# Patient Record
Sex: Male | Born: 1960 | Race: White | Hispanic: No | Smoking: Never smoker
Health system: Southern US, Community
[De-identification: ages and names within clinical notes are randomized; demographics above are authoritative.]

## PROBLEM LIST (undated history)

## (undated) DIAGNOSIS — K7460 Unspecified cirrhosis of liver: Secondary | ICD-10-CM

## (undated) DIAGNOSIS — Z5189 Encounter for other specified aftercare: Secondary | ICD-10-CM

## (undated) DIAGNOSIS — J45909 Unspecified asthma, uncomplicated: Secondary | ICD-10-CM

## (undated) DIAGNOSIS — H269 Unspecified cataract: Secondary | ICD-10-CM

## (undated) DIAGNOSIS — D649 Anemia, unspecified: Secondary | ICD-10-CM

## (undated) DIAGNOSIS — E871 Hypo-osmolality and hyponatremia: Secondary | ICD-10-CM

## (undated) DIAGNOSIS — M009 Pyogenic arthritis, unspecified: Secondary | ICD-10-CM

## (undated) HISTORY — DX: Encounter for other specified aftercare: Z51.89

## (undated) HISTORY — DX: Anemia, unspecified: D64.9

## (undated) HISTORY — DX: Unspecified cataract: H26.9

## (undated) HISTORY — DX: Unspecified asthma, uncomplicated: J45.909

---

## 2002-02-20 ENCOUNTER — Encounter: Payer: Self-pay | Admitting: Internal Medicine

## 2002-02-20 ENCOUNTER — Encounter: Admission: RE | Admit: 2002-02-20 | Discharge: 2002-02-20 | Payer: Self-pay | Admitting: Internal Medicine

## 2004-08-26 ENCOUNTER — Ambulatory Visit: Payer: Self-pay | Admitting: Internal Medicine

## 2004-08-29 ENCOUNTER — Ambulatory Visit: Payer: Self-pay | Admitting: Internal Medicine

## 2007-01-13 DIAGNOSIS — J45909 Unspecified asthma, uncomplicated: Secondary | ICD-10-CM | POA: Insufficient documentation

## 2007-01-13 DIAGNOSIS — J309 Allergic rhinitis, unspecified: Secondary | ICD-10-CM | POA: Insufficient documentation

## 2008-03-14 ENCOUNTER — Inpatient Hospital Stay (HOSPITAL_COMMUNITY): Admission: AD | Admit: 2008-03-14 | Discharge: 2008-03-15 | Payer: Self-pay | Admitting: Urology

## 2010-05-13 ENCOUNTER — Emergency Department (HOSPITAL_COMMUNITY)
Admission: EM | Admit: 2010-05-13 | Discharge: 2010-05-13 | Payer: Self-pay | Source: Home / Self Care | Admitting: Emergency Medicine

## 2010-05-14 LAB — COMPREHENSIVE METABOLIC PANEL
ALT: 24 U/L (ref 0–53)
AST: 35 U/L (ref 0–37)
Albumin: 3.9 g/dL (ref 3.5–5.2)
Alkaline Phosphatase: 110 U/L (ref 39–117)
BUN: 6 mg/dL (ref 6–23)
CO2: 23 mEq/L (ref 19–32)
Calcium: 9.6 mg/dL (ref 8.4–10.5)
Chloride: 98 mEq/L (ref 96–112)
Creatinine, Ser: 0.87 mg/dL (ref 0.4–1.5)
GFR calc Af Amer: >60 mL/min (ref >60)
GFR calc non Af Amer: >60 mL/min (ref >60)
Glucose, Bld: 95 mg/dL (ref 70–99)
Potassium: 3.8 mEq/L (ref 3.5–5.1)
Sodium: 133 mEq/L — ABNORMAL LOW (ref 135–145)
Total Bilirubin: 1.4 mg/dL — ABNORMAL HIGH (ref 0.3–1.2)
Total Protein: 7.8 g/dL (ref 6.0–8.3)

## 2010-05-14 LAB — DIFFERENTIAL
Basophils Absolute: 0 10*3/uL (ref 0.0–0.1)
Basophils Relative: 0 % (ref 0–1)
Eosinophils Absolute: 0.1 10*3/uL (ref 0.0–0.7)
Eosinophils Relative: 1 % (ref 0–5)
Lymphocytes Relative: 15 % (ref 12–46)
Lymphs Abs: 2.3 10*3/uL (ref 0.7–4.0)
Monocytes Absolute: 1.3 10*3/uL — ABNORMAL HIGH (ref 0.1–1.0)
Monocytes Relative: 9 % (ref 3–12)
Neutro Abs: 11.4 10*3/uL — ABNORMAL HIGH (ref 1.7–7.7)
Neutrophils Relative %: 75 % (ref 43–77)

## 2010-05-14 LAB — CBC
HCT: 43.4 % (ref 39.0–52.0)
Hemoglobin: 15 g/dL (ref 13.0–17.0)
MCH: 32.8 pg (ref 26.0–34.0)
MCHC: 34.6 g/dL (ref 30.0–36.0)
MCV: 95 fL (ref 78.0–100.0)
Platelets: 247 10*3/uL (ref 150–400)
RBC: 4.57 MIL/uL (ref 4.22–5.81)
RDW: 12.5 % (ref 11.5–15.5)
WBC: 15.2 10*3/uL — ABNORMAL HIGH (ref 4.0–10.5)

## 2010-09-02 NOTE — H&P (Signed)
Kyle Pitts               ACCOUNT NO.:  [id number]   MEDICAL RECORD NO.:  [id number]          PATIENT TYPE:  INP   LOCATION:  1409                         FACILITY:  [facility]   PHYSICIAN:  Sigmund I. Patsi Sears, M.D.DATE OF BIRTH:  10/15/1960   DATE OF ADMISSION:  03/14/2008  DATE OF DISCHARGE:                              HISTORY & PHYSICAL   HISTORY:  Kyle Pitts is a 50 year old male referred by Dr. Andi Devon with  left hemiscrotal abscess.  The patient noted abnormality in the left  hemiscrotum, which he picked and drained and then noticed immediate  erythematous swelling and pain in the left hemiscrotum with fever and  chills.  He was admitted to the hospital post I and D in the operating  room.   PAST MEDICAL HISTORY:  Arthritis.   ALLERGIES:  PENICILLIN (rash).   FAMILY HISTORY:  Is significant for renal failure brother, father,  sister as well.   SOCIAL HISTORY:  The patient is currently jobless.  He is unemployed.  The patient is currently unemployed.  He is divorced.  He drinks two  alcoholic drinks per day, drinks two cups of coffee per day.   PHYSICAL EXAM:  GENERAL:  Shows a well-developed, well-nourished white  male in mild distress.  VITAL SIGNS:  Blood pressure 157/65, temperature 98.6 and pulse was 92.  NECK:  Supple, nontender.  CHEST:  Clear to P and A.  ABDOMEN:  Positive bowel sounds, soft.  Without organomegaly or masses.  GENITOURINARY:  Shows swollen grossly deformed left hemiscrotum with pus  oozing from three areas in the left hemiscrotum.  The penis is also  edematous and deformed.  EXTREMITIES:  No cyanosis, no edema.  PSYCHOLOGIC:  Normal orientation to time, person, place.   IMPRESSION:  Left hemiscrotal abscess.   PLAN:  Admit to the OR for I and D of abscess.      Sigmund I. Patsi Sears, M.D.  Electronically Signed     SIT/MEDQ  D:  03/14/2008  T:  03/15/2008  Job:  [id number]   cc:   Gabriel Earing, M.D.  Fax: [phone]

## 2010-09-02 NOTE — Op Note (Signed)
NAMEMARTICE, DOTY               ACCOUNT NO.:  [id number]   MEDICAL RECORD NO.:  [id number]          PATIENT TYPE:  INP   LOCATION:  1409                         FACILITY:  [facility]   PHYSICIAN:  Sigmund I. Patsi Sears, M.D.DATE OF BIRTH:  06-Mar-1961   DATE OF PROCEDURE:  03/14/2008  DATE OF DISCHARGE:                               OPERATIVE REPORT   PREOPERATIVE DIAGNOSIS:  Left hemiscrotal abscess.   POSTOPERATIVE DIAGNOSIS:  Left hemiscrotal abscess.   OPERATIONS:  Incision and drainage of left hemiscrotal abscess with  culture and sensitivity, and flexible cystoscopy.   SURGEON:  Sigmund I. Patsi Sears, M.D.   PREPARATION:  After appropriate preanesthesia, the patient was brought  to the operating room and placed on the operating table in dorsal supine  position where general endotracheal anesthesia was induced.  He was then  replaced in the dorsal lithotomy position where pubis was prepped with  Betadine solution and draped in usual fashion.   REVIEW OF HISTORY:  Mr. Brancato is a 50 year old male who noted the  onset of painful scrotal swelling after picking a red bump on the left  hemiscrotum.  This was coincidental with an infection on the tip of his  nose as well.  The patient does squeeze the abscess, and but could not  drain all the pus.  He has since developed large left hemiscrotum,  painful, erythematous, with chills and fever.  He had an ultrasound in  the office, which showed a left subcutaneous abscess.  He is now for  incision and drainage of scrotal abscess.   PROCEDURE:  The left hemiscrotal abscess was incised, and cultured.  The  abscess tracked toward the abdominal wall, this was sharply dissected.  It also dissected inferiorly to the scrotum, this was also opened and  dissected.  Culture was obtained.  Following this, the wound was  irrigated using the pulse evacuator with 6 liters of fluid.  Following  this, packing was placed.  Skin edge bleeding was  stopped with  electrosurgical unit.  The patient was awakened, taken recovery room in  good condition.      Sigmund I. Patsi Sears, M.D.  Electronically Signed     SIT/MEDQ  D:  03/14/2008  T:  03/15/2008  Job:  [id number]   cc:   Gabriel Earing, M.D.  Fax: [phone]

## 2011-01-20 LAB — GRAM STAIN

## 2011-01-20 LAB — CBC
MCV: 101.4 — ABNORMAL HIGH
Platelets: 286
RBC: 3.89 — ABNORMAL LOW
RDW: 13.2
WBC: 12.1 — ABNORMAL HIGH

## 2011-01-20 LAB — COMPREHENSIVE METABOLIC PANEL
ALT: 32
Albumin: 3.4 — ABNORMAL LOW
Alkaline Phosphatase: 145 — ABNORMAL HIGH
Calcium: 9.3
GFR calc Af Amer: >60
Potassium: 3.9
Sodium: 131 — ABNORMAL LOW
Total Protein: 7.2

## 2011-01-20 LAB — CULTURE, ROUTINE-ABSCESS

## 2011-01-20 LAB — ANAEROBIC CULTURE

## 2020-09-27 ENCOUNTER — Inpatient Hospital Stay (HOSPITAL_COMMUNITY)
Admission: EM | Admit: 2020-09-27 | Discharge: 2020-10-02 | DRG: 487 | Disposition: A | Discharge status: Home Health | Payer: Self-pay | Attending: Orthopedic Surgery | Admitting: Orthopedic Surgery

## 2020-09-27 ENCOUNTER — Encounter (HOSPITAL_COMMUNITY)
Admission: EM | Disposition: A | Discharge status: Home Health | Payer: Self-pay | Source: Home / Self Care | Attending: Orthopedic Surgery

## 2020-09-27 ENCOUNTER — Emergency Department (HOSPITAL_COMMUNITY): Payer: Self-pay

## 2020-09-27 ENCOUNTER — Inpatient Hospital Stay: Admit: 2020-09-27 | Payer: Self-pay | Admitting: Orthopedic Surgery

## 2020-09-27 ENCOUNTER — Emergency Department (HOSPITAL_COMMUNITY): Payer: Self-pay | Admitting: Certified Registered Nurse Anesthetist

## 2020-09-27 ENCOUNTER — Encounter (HOSPITAL_COMMUNITY): Payer: Self-pay | Admitting: Emergency Medicine

## 2020-09-27 ENCOUNTER — Other Ambulatory Visit: Payer: Self-pay

## 2020-09-27 DIAGNOSIS — Z01811 Encounter for preprocedural respiratory examination: Secondary | ICD-10-CM

## 2020-09-27 DIAGNOSIS — Z888 Allergy status to other drugs, medicaments and biological substances status: Secondary | ICD-10-CM

## 2020-09-27 DIAGNOSIS — B950 Streptococcus, group A, as the cause of diseases classified elsewhere: Secondary | ICD-10-CM | POA: Diagnosis present

## 2020-09-27 DIAGNOSIS — Z20822 Contact with and (suspected) exposure to covid-19: Secondary | ICD-10-CM | POA: Diagnosis present

## 2020-09-27 DIAGNOSIS — M009 Pyogenic arthritis, unspecified: Secondary | ICD-10-CM

## 2020-09-27 DIAGNOSIS — Z880 Allergy status to penicillin: Secondary | ICD-10-CM

## 2020-09-27 DIAGNOSIS — M25461 Effusion, right knee: Secondary | ICD-10-CM | POA: Diagnosis present

## 2020-09-27 DIAGNOSIS — M00261 Other streptococcal arthritis, right knee: Principal | ICD-10-CM

## 2020-09-27 DIAGNOSIS — M659 Synovitis and tenosynovitis, unspecified: Secondary | ICD-10-CM | POA: Diagnosis not present

## 2020-09-27 DIAGNOSIS — D72829 Elevated white blood cell count, unspecified: Secondary | ICD-10-CM | POA: Diagnosis present

## 2020-09-27 HISTORY — PX: KNEE ARTHROSCOPY: SHX127

## 2020-09-27 LAB — COMPREHENSIVE METABOLIC PANEL
ALT: 59 U/L — ABNORMAL HIGH (ref 0–44)
AST: 107 U/L — ABNORMAL HIGH (ref 15–41)
Albumin: 3.8 g/dL (ref 3.5–5.0)
Alkaline Phosphatase: 89 U/L (ref 38–126)
Anion gap: 19 — ABNORMAL HIGH (ref 5–15)
BUN: 16 mg/dL (ref 6–20)
CO2: 16 mmol/L — ABNORMAL LOW (ref 22–32)
Calcium: 8.9 mg/dL (ref 8.9–10.3)
Chloride: 88 mmol/L — ABNORMAL LOW (ref 98–111)
Creatinine, Ser: 1.62 mg/dL — ABNORMAL HIGH (ref 0.61–1.24)
GFR, Estimated: 49 mL/min — ABNORMAL LOW (ref >60)
Glucose, Bld: 85 mg/dL (ref 70–99)
Potassium: 4 mmol/L (ref 3.5–5.1)
Sodium: 123 mmol/L — ABNORMAL LOW (ref 135–145)
Total Bilirubin: 2.6 mg/dL — ABNORMAL HIGH (ref 0.3–1.2)
Total Protein: 7.9 g/dL (ref 6.5–8.1)

## 2020-09-27 LAB — CBC WITH DIFFERENTIAL/PLATELET
Abs Immature Granulocytes: 0.31 10*3/uL — ABNORMAL HIGH (ref 0.00–0.07)
Basophils Absolute: 0.1 10*3/uL (ref 0.0–0.1)
Basophils Relative: 0 %
Eosinophils Absolute: 0 10*3/uL (ref 0.0–0.5)
Eosinophils Relative: 0 %
HCT: 39.4 % (ref 39.0–52.0)
Hemoglobin: 13.8 g/dL (ref 13.0–17.0)
Immature Granulocytes: 1 %
Lymphocytes Relative: 1 %
Lymphs Abs: 0.4 10*3/uL — ABNORMAL LOW (ref 0.7–4.0)
MCH: 36.1 pg — ABNORMAL HIGH (ref 26.0–34.0)
MCHC: 35 g/dL (ref 30.0–36.0)
MCV: 103.1 fL — ABNORMAL HIGH (ref 80.0–100.0)
Monocytes Absolute: 1.4 10*3/uL — ABNORMAL HIGH (ref 0.1–1.0)
Monocytes Relative: 5 %
Neutro Abs: 24.8 10*3/uL — ABNORMAL HIGH (ref 1.7–7.7)
Neutrophils Relative %: 93 %
Platelets: 141 10*3/uL — ABNORMAL LOW (ref 150–400)
RBC: 3.82 MIL/uL — ABNORMAL LOW (ref 4.22–5.81)
RDW: 12.2 % (ref 11.5–15.5)
WBC: 27 10*3/uL — ABNORMAL HIGH (ref 4.0–10.5)
nRBC: 0 % (ref 0.0–0.2)

## 2020-09-27 LAB — SYNOVIAL CELL COUNT + DIFF, W/ CRYSTALS
Eosinophils-Synovial: 0 % (ref 0–1)
Lymphocytes-Synovial Fld: 1 % (ref 0–20)
Monocyte-Macrophage-Synovial Fluid: 18 % — ABNORMAL LOW (ref 50–90)
Neutrophil, Synovial: 81 % — ABNORMAL HIGH (ref 0–25)
WBC, Synovial: 129280 /mm3 — ABNORMAL HIGH (ref 0–200)

## 2020-09-27 LAB — PROTIME-INR
INR: 1.1 (ref 0.8–1.2)
Prothrombin Time: 13.7 seconds (ref 11.4–15.2)

## 2020-09-27 LAB — SEDIMENTATION RATE: Sed Rate: 3 mm/hr (ref 0–16)

## 2020-09-27 LAB — RESP PANEL BY RT-PCR (FLU A&B, COVID) ARPGX2
Influenza A by PCR: NEGATIVE
Influenza B by PCR: NEGATIVE
SARS Coronavirus 2 by RT PCR: NEGATIVE

## 2020-09-27 LAB — C-REACTIVE PROTEIN: CRP: 9.9 mg/dL — ABNORMAL HIGH (ref <1.0)

## 2020-09-27 SURGERY — ARTHROSCOPY, KNEE
Anesthesia: General | Site: Knee | Laterality: Right

## 2020-09-27 MED ORDER — HYDROCODONE-ACETAMINOPHEN 5-325 MG PO TABS
2.0000 | ORAL_TABLET | Freq: Once | ORAL | Status: AC
  Administered 2020-09-27: 2 via ORAL
  Filled 2020-09-27: qty 2

## 2020-09-27 MED ORDER — MIDAZOLAM HCL 5 MG/5ML IJ SOLN
INTRAMUSCULAR | Status: DC | PRN
  Administered 2020-09-27: 2 mg via INTRAVENOUS

## 2020-09-27 MED ORDER — ALBUMIN HUMAN 5 % IV SOLN: INTRAVENOUS | Status: DC | PRN

## 2020-09-27 MED ORDER — EPHEDRINE 5 MG/ML INJ
INTRAVENOUS | Status: AC
  Filled 2020-09-27: qty 10

## 2020-09-27 MED ORDER — LACTATED RINGERS IV SOLN: INTRAVENOUS | Status: DC | PRN

## 2020-09-27 MED ORDER — EPHEDRINE SULFATE-NACL 50-0.9 MG/10ML-% IV SOSY
PREFILLED_SYRINGE | INTRAVENOUS | Status: DC | PRN
  Administered 2020-09-27 (×7): 10 mg via INTRAVENOUS

## 2020-09-27 MED ORDER — PROPOFOL 10 MG/ML IV BOLUS
INTRAVENOUS | Status: AC
  Filled 2020-09-27: qty 20

## 2020-09-27 MED ORDER — PHENYLEPHRINE 40 MCG/ML (10ML) SYRINGE FOR IV PUSH (FOR BLOOD PRESSURE SUPPORT)
PREFILLED_SYRINGE | INTRAVENOUS | Status: AC
  Filled 2020-09-27: qty 10

## 2020-09-27 MED ORDER — DEXAMETHASONE SODIUM PHOSPHATE 10 MG/ML IJ SOLN
INTRAMUSCULAR | Status: AC
  Filled 2020-09-27: qty 1

## 2020-09-27 MED ORDER — PHENYLEPHRINE HCL-NACL 10-0.9 MG/250ML-% IV SOLN
INTRAVENOUS | Status: DC | PRN
  Administered 2020-09-27: 75 ug/min via INTRAVENOUS

## 2020-09-27 MED ORDER — LIDOCAINE 2% (20 MG/ML) 5 ML SYRINGE
INTRAMUSCULAR | Status: DC | PRN
  Administered 2020-09-27: 100 mg via INTRAVENOUS

## 2020-09-27 MED ORDER — ACETAMINOPHEN 10 MG/ML IV SOLN
INTRAVENOUS | Status: AC
  Filled 2020-09-27: qty 100

## 2020-09-27 MED ORDER — MIDAZOLAM HCL 2 MG/2ML IJ SOLN
INTRAMUSCULAR | Status: AC
  Filled 2020-09-27: qty 2

## 2020-09-27 MED ORDER — VANCOMYCIN HCL 1500 MG/300ML IV SOLN
1500.0000 mg | Freq: Once | INTRAVENOUS | Status: AC
  Administered 2020-09-27: 1500 mg via INTRAVENOUS
  Filled 2020-09-27: qty 300

## 2020-09-27 MED ORDER — LIDOCAINE 2% (20 MG/ML) 5 ML SYRINGE
INTRAMUSCULAR | Status: AC
  Filled 2020-09-27: qty 5

## 2020-09-27 MED ORDER — FENTANYL CITRATE (PF) 100 MCG/2ML IJ SOLN
INTRAMUSCULAR | Status: DC | PRN
  Administered 2020-09-27: 100 ug via INTRAVENOUS

## 2020-09-27 MED ORDER — ONDANSETRON HCL 4 MG/2ML IJ SOLN
INTRAMUSCULAR | Status: DC | PRN
  Administered 2020-09-27: 4 mg via INTRAVENOUS

## 2020-09-27 MED ORDER — ONDANSETRON HCL 4 MG/2ML IJ SOLN
INTRAMUSCULAR | Status: AC
  Filled 2020-09-27: qty 2

## 2020-09-27 MED ORDER — FENTANYL CITRATE (PF) 100 MCG/2ML IJ SOLN
12.5000 ug | Freq: Once | INTRAMUSCULAR | Status: AC
  Administered 2020-09-27: 12.5 ug via INTRAVENOUS
  Filled 2020-09-27: qty 2

## 2020-09-27 MED ORDER — FENTANYL CITRATE (PF) 250 MCG/5ML IJ SOLN
INTRAMUSCULAR | Status: AC
  Filled 2020-09-27: qty 5

## 2020-09-27 MED ORDER — PROPOFOL 10 MG/ML IV BOLUS
INTRAVENOUS | Status: DC | PRN
  Administered 2020-09-27: 150 mg via INTRAVENOUS

## 2020-09-27 MED ORDER — PHENYLEPHRINE 40 MCG/ML (10ML) SYRINGE FOR IV PUSH (FOR BLOOD PRESSURE SUPPORT)
PREFILLED_SYRINGE | INTRAVENOUS | Status: DC | PRN
  Administered 2020-09-27 (×4): 200 ug via INTRAVENOUS

## 2020-09-27 MED ORDER — IBUPROFEN 800 MG PO TABS
800.0000 mg | ORAL_TABLET | Freq: Once | ORAL | Status: AC
  Administered 2020-09-27: 800 mg via ORAL
  Filled 2020-09-27: qty 1

## 2020-09-27 MED ORDER — OXYCODONE HCL 5 MG PO TABS: 10.0000 mg | ORAL_TABLET | ORAL | Status: DC | PRN

## 2020-09-27 MED ORDER — PHENYLEPHRINE HCL (PRESSORS) 10 MG/ML IV SOLN
INTRAVENOUS | Status: AC
  Filled 2020-09-27: qty 1

## 2020-09-27 MED ORDER — DEXAMETHASONE SODIUM PHOSPHATE 10 MG/ML IJ SOLN
INTRAMUSCULAR | Status: DC | PRN
  Administered 2020-09-27: 5 mg via INTRAVENOUS

## 2020-09-27 MED ORDER — VANCOMYCIN HCL IN DEXTROSE 1-5 GM/200ML-% IV SOLN
INTRAVENOUS | Status: AC
  Administered 2020-09-27: 1500 mg
  Filled 2020-09-27: qty 400

## 2020-09-27 MED ORDER — SODIUM CHLORIDE 0.9 % IR SOLN
Status: DC | PRN
  Administered 2020-09-27: 6000 mL

## 2020-09-27 MED ORDER — LIDOCAINE-EPINEPHRINE (PF) 2 %-1:200000 IJ SOLN
10.0000 mL | Freq: Once | INTRAMUSCULAR | Status: AC
  Administered 2020-09-27: 10 mL via INTRADERMAL
  Filled 2020-09-27: qty 20

## 2020-09-27 MED ORDER — BUPIVACAINE HCL (PF) 0.5 % IJ SOLN
INTRAMUSCULAR | Status: DC | PRN
  Administered 2020-09-27: 20 mL

## 2020-09-27 SURGICAL SUPPLY — 24 items
BLADE SURG SZ11 CARB STEEL (BLADE) ×1 IMPLANT
BNDG CMPR MED 10X6 ELC LF (GAUZE/BANDAGES/DRESSINGS) ×2
BNDG ELASTIC 6X10 VLCR STRL LF (GAUZE/BANDAGES/DRESSINGS) ×2 IMPLANT
BNDG ELASTIC 6X5.8 VLCR STR LF (GAUZE/BANDAGES/DRESSINGS) IMPLANT
COVER SURGICAL LIGHT HANDLE (MISCELLANEOUS) ×2 IMPLANT
DECANTER SPIKE VIAL GLASS SM (MISCELLANEOUS) ×1 IMPLANT
DRSG EMULSION OIL 3X3 NADH (GAUZE/BANDAGES/DRESSINGS) ×1 IMPLANT
DRSG PAD ABDOMINAL 8X10 ST (GAUZE/BANDAGES/DRESSINGS) ×2 IMPLANT
DW OUTFLOW CASSETTE/TUBE SET (MISCELLANEOUS) ×1 IMPLANT
GAUZE SPONGE 4X4 12PLY STRL (GAUZE/BANDAGES/DRESSINGS) ×1 IMPLANT
GLOVE SURG ENC MOIS LTX SZ7 (GLOVE) ×2 IMPLANT
GLOVE SURG ORTHO LTX SZ8.5 (GLOVE) ×2 IMPLANT
GOWN STRL REUS W/ TWL XL LVL3 (GOWN DISPOSABLE) IMPLANT
GOWN STRL REUS W/TWL XL LVL3 (GOWN DISPOSABLE) ×4 IMPLANT
KIT BASIN OR (CUSTOM PROCEDURE TRAY) ×1 IMPLANT
KIT TURNOVER KIT A (KITS) ×2 IMPLANT
MANIFOLD NEPTUNE II (INSTRUMENTS) ×2 IMPLANT
PACK ORTHO EXTREMITY (CUSTOM PROCEDURE TRAY) ×1 IMPLANT
PROTECTOR NERVE ULNAR (MISCELLANEOUS) ×2 IMPLANT
SUT BONE WAX W31G (SUTURE) ×2 IMPLANT
SUT MON AB 3-0 SH 27 (SUTURE) ×2
SUT MON AB 3-0 SH27 (SUTURE) IMPLANT
SYR 20ML LL LF (SYRINGE) ×1 IMPLANT
TOWEL OR 17X26 10 PK STRL BLUE (TOWEL DISPOSABLE) ×4 IMPLANT

## 2020-09-27 NOTE — Brief Op Note (Signed)
09/27/2020  11:57 PM  PATIENT:  Kyle Pitts  60 y.o. male  PRE-OPERATIVE DIAGNOSIS:  septic right knee  POST-OPERATIVE DIAGNOSIS:  septic right knee  PROCEDURE:  Procedure(s): ARTHROSCOPY KNEE, WASH OUT (Right)  SURGEON:  Surgeon(s) and Role:    Venita Lick, MD - Primary  PHYSICIAN ASSISTANT:   ASSISTANTS: none   ANESTHESIA:   general  EBL:  10 mL   BLOOD ADMINISTERED:none  DRAINS: none   LOCAL MEDICATIONS USED:  MARCAINE     SPECIMEN:  No Specimen  DISPOSITION OF SPECIMEN:  N/A  COUNTS:  YES  TOURNIQUET:  * No tourniquets in log *  DICTATION: .Dragon Dictation  PLAN OF CARE: Admit to inpatient   PATIENT DISPOSITION:  PACU - hemodynamically stable.

## 2020-09-27 NOTE — Anesthesia Preprocedure Evaluation (Addendum)
Anesthesia Evaluation  Patient identified by MRN, date of birth, ID band Patient awake    Reviewed: Allergy & Precautions, NPO status , Patient's Chart, lab work & pertinent test results  History of Anesthesia Complications Negative for: history of anesthetic complications  Airway Mallampati: III  TM Distance: >3 FB Neck ROM: Full    Dental  (+) Dental Advisory Given   Pulmonary neg pulmonary ROS,    Pulmonary exam normal        Cardiovascular negative cardio ROS Normal cardiovascular exam     Neuro/Psych negative neurological ROS  negative psych ROS   GI/Hepatic negative GI ROS, Neg liver ROS,   Endo/Other  negative endocrine ROS  Renal/GU negative Renal ROS  negative genitourinary   Musculoskeletal negative musculoskeletal ROS (+)   Abdominal   Peds negative pediatric ROS (+)  Hematology negative hematology ROS (+)   Anesthesia Other Findings   Reproductive/Obstetrics negative OB ROS                            Anesthesia Physical Anesthesia Plan  ASA: 2 and emergent  Anesthesia Plan: General   Post-op Pain Management:    Induction: Intravenous  PONV Risk Score and Plan: 3 and Ondansetron, Dexamethasone and Midazolam  Airway Management Planned: LMA  Additional Equipment:   Intra-op Plan:   Post-operative Plan: Extubation in OR  Informed Consent: I have reviewed the patients History and Physical, chart, labs and discussed the procedure including the risks, benefits and alternatives for the proposed anesthesia with the patient or authorized representative who has indicated his/her understanding and acceptance.     Dental advisory given  Plan Discussed with: CRNA, Anesthesiologist and Surgeon  Anesthesia Plan Comments:         Anesthesia Quick Evaluation

## 2020-09-27 NOTE — ED Provider Notes (Signed)
[facility] DEPT Provider Note   CSN: [id number] Arrival date & time: 09/27/20  1216     History Chief Complaint  Patient presents with   Knee Pain    Kyle Pitts is a 60 y.o. male with no contributing medical history who presents with complaint of right knee pain and swelling which started 2 days ago. Has not had this problem before. 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 No other injury or strain. No fever, chills. He was seen at an outside ED yesterday and prescribed tramadol, states they told him  he does not have gout, presumably by a serum uric acid level. Since then pain and swelling have continued to increase. Denies IV drug use.   History reviewed. No pertinent past medical history.  Patient Active Problem List   Diagnosis Date Noted   ALLERGIC RHINITIS 01/13/2007   ASTHMA 01/13/2007    History reviewed. No pertinent surgical history.    No family history on file.     Home Medications Prior to Admission medications   Not on File  Tramadol  Allergies    Cephalexin and Penicillins  Review of Systems   Review of Systems  Constitutional:  Negative for chills and fever.  Musculoskeletal:  Positive for arthralgias and joint swelling.  All other systems reviewed and are negative.  Physical Exam Updated Vital Signs BP 105/67 (BP Location: Right Arm)   Pulse 80   Temp 98.2 F (36.8 C) (Oral)   Resp 17   SpO2 97%   Physical Exam Vitals and nursing note reviewed.  Constitutional:      Appearance: Normal appearance.  HENT:     Head: Normocephalic and atraumatic.     Right Ear: External ear normal.     Left Ear: External ear normal.     Nose: Nose normal.     Mouth/Throat:     Mouth: Mucous membranes are moist.  Eyes:     Extraocular Movements: Extraocular movements intact.  Pulmonary:     Effort: Pulmonary effort is normal.  Abdominal:      General: Abdomen is flat.  Musculoskeletal:     Cervical back: Normal range of motion and neck supple.     Comments: Large suprapatellar effusion and erythema of R knee Severe pain with flexion of the knee No particular bony tenderness Neurovascularly intact distal to the knee No skin lesion  Skin:    General: Skin is warm and dry.  Neurological:     General: No focal deficit present.     Mental Status: He is alert and oriented to person, place, and time.  Psychiatric:        Mood and Affect: Mood normal.        Behavior: Behavior normal.    ED Results / Procedures / Treatments   Labs (all labs ordered are listed, but only abnormal results are displayed) Labs Reviewed  CBC WITH DIFFERENTIAL/PLATELET - Abnormal; Notable for the following components:      Result Value   WBC 27.0 (*)    RBC 3.82 (*)    MCV 103.1 (*)    MCH 36.1 (*)    Platelets 141 (*)    Neutro Abs 24.8 (*)    Lymphs Abs 0.4 (*)    Monocytes Absolute 1.4 (*)    Abs Immature Granulocytes 0.31 (*)    All other components within normal limits  C-REACTIVE PROTEIN - Abnormal;  Notable for the following components:   CRP 9.9 (*)    All other components within normal limits  SYNOVIAL CELL COUNT + DIFF, W/ CRYSTALS - Abnormal; Notable for the following components:   Color, Synovial BROWN (*)    Appearance-Synovial TURBID (*)    WBC, Synovial 129,280 (*)    Neutrophil, Synovial 81 (*)    Monocyte-Macrophage-Synovial Fluid 18 (*)    All other components within normal limits  BODY FLUID CULTURE W GRAM STAIN  RESP PANEL BY RT-PCR (FLU A&B, COVID) ARPGX2  SEDIMENTATION RATE  GLUCOSE, BODY FLUID OTHER            PROTEIN, BODY FLUID (OTHER)  PATHOLOGIST SMEAR REVIEW  PROTIME-INR  COMPREHENSIVE METABOLIC PANEL  POC SARS CORONAVIRUS 2 AG -  ED    EKG None  Radiology DG Knee Complete 4 Views Right  Result Date: 09/27/2020 CLINICAL DATA:  Right knee pain and swelling without recent injury. EXAM: RIGHT KNEE -  COMPLETE 4+ VIEW COMPARISON:  None. FINDINGS: No fracture or dislocation is noted. Moderate suprapatellar joint effusion is noted. Moderate narrowing of medial joint space is noted. 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 Moderate suprapatellar joint effusion. No fracture or dislocation is noted. Electronically Signed   By: Marijo Conception M.D.   On: 09/27/2020 13:02    Procedures .Joint Aspiration/Arthrocentesis  Date/Time: 09/27/2020 7:37 PM Performed by: Andrew Au, MD Authorized by: Deno Etienne, DO   Consent:    Consent obtained:  Verbal   Consent given by:  Patient   Risks, benefits, and alternatives were discussed: yes     Risks discussed:  Bleeding, infection and pain   Alternatives discussed:  Alternative treatment Universal protocol:    Procedure explained and questions answered to patient or proxy's satisfaction: yes     Relevant documents present and verified: yes     Test results available: yes     Imaging studies available: yes     Site/side marked: yes     Immediately prior to procedure, a time out was called: yes     Patient identity confirmed:  Arm band Location:    Location:  Knee   Knee:  R knee Anesthesia:    Anesthesia method:  Local infiltration Procedure details:    Preparation: Patient was prepped and draped in usual sterile fashion     Needle gauge:  20 G   Ultrasound guidance: no     Approach:  Lateral   Aspirate characteristics:  Blood-tinged, purulent and cloudy   Steroid injected: no     Specimen collected: yes   Post-procedure details:    Dressing:  Adhesive bandage   Procedure completion:  Tolerated well, no immediate complications   Medications Ordered in ED Medications  fentaNYL (SUBLIMAZE) injection 12.5 mcg (has no administration in time range)  HYDROcodone-acetaminophen (NORCO/VICODIN) 5-325 MG per tablet 2 tablet (2 tablets Oral Given 09/27/20 1828)  ibuprofen (ADVIL) tablet 800 mg (800 mg Oral Given 09/27/20  1828)  lidocaine-EPINEPHrine (XYLOCAINE W/EPI) 2 %-1:200000 (PF) injection 10 mL (10 mLs Intradermal Given 09/27/20 1924)    ED Course  I have reviewed the triage vital signs and the nursing notes.  Pertinent labs & imaging results that were available during my care of the patient were reviewed by me and considered in my medical decision making (see chart for details).    MDM Rules/Calculators/A&P  Patient presents with swelling and erythema of R knee for the past 2 days, severe pain with ROM of the knee and large suprapatellar effusion. 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 Xray with degenerative change but no acute finding. Suspect crystalline arthropathy vs septic arthritis. Less likely secondary to trauma. Will aspirate and send fluid studies. Also check CBC, CRP, ESR.  Aspiration performed, removed 55cc of grossly purulent fluid. Patient tolerated procedure well. Fluid studies pending  WBC of 27,000. CRP 9.9  Fluid studies demonstrated 129,000 WBCs. Monosodium urate crystals present. Gram stain and culture pending. Will consult orthopedic surgery.   Orthopedic surgery planning procedure tonight. Will consult hospitalist for admission.   Final Clinical Impression(s) / ED Diagnoses Final diagnoses:  Pre-op chest exam  Pyogenic arthritis of right knee joint, due to unspecified organism [facility])    Rx / [place] Orders ED Discharge Orders     None        Andrew Au, MD 09/27/20 2148    Deno Etienne, DO 09/27/20 2219

## 2020-09-27 NOTE — ED Triage Notes (Addendum)
Per EMS, patient from home, c/o R knee pain with swelling since yesterday. Seen at ED yesterday and prescribed tramadol with little relief. Reports old injury, denies new injury.

## 2020-09-27 NOTE — Op Note (Signed)
OPERATIVE REPORT  DATE OF SURGERY: 09/27/2020  PATIENT NAME:  Kyle Pitts MRN: [id number] DOB: 02-27-1961  PCP: [name], MD  PRE-OPERATIVE DIAGNOSIS: Septic right knee  POST-OPERATIVE DIAGNOSIS: Same  PROCEDURE:   Arthroscopic right knee irrigation and debridement  SURGEON:  Venita Lick, MD  PHYSICIAN ASSISTANT: None  ANESTHESIA:   General  EBL: Minimal   BRIEF HISTORY: PAWAN KNECHTEL is a 60 y.o. male who presents to the emergency room with 2-day history of significant knee pain, swelling, and difficulty ambulating and moving the knee.  Aspiration revealed purulent material and so orthopedic consultation was requested.  Patient was brought the operating room for an arthroscopic I&D of a septic right knee.  Intraoperative findings: Frank purulent material was noted upon entry of the first cannula.  Irrigated a total of 6 L of fluid which was clear at the end of the procedure.  PROCEDURE DETAILS: Patient was brought into the operating room. After successful induction of general anesthesia and endotracheal intubation a Time Out was done. This confirmed all pertinent important data.  The right lower extremity was then prepped and draped in a standard fashion and the left was placed in a padded leg holder.  A 18-gauge needle was used to place the superior lateral portal.  A small incision was made after I determine the correct angle and advanced the cannula into the knee joint.  At this point frank purulent material was noted to be expressed from the knee joint.  An inferior medial portal was then established as was an inferior lateral portal.  Cannulas were placed first in the inferior medial portal.  I then established the inflow and outflow and began irrigating the knee joint.  After approximately 3000 cc of fluid were irrigated I switched the inflow to the inferior lateral portal in order to adequately irrigate the entire knee joint.  A total of 6 L was used to irrigate the  knee.  The fluid was clear and there was no evidence of active purulent material that was being expressed.  After squeezing the remaining fluid out of the knee I then closed the inferior medial and inferior lateral portals with a 3-0 Monocryl stitch the superior lateral portal was left to allow further drainage.  20 cc of quarter percent plain Marcaine was then injected into the knee for postoperative analgesia.  The compartments in the thigh and calf remained soft throughout the entire procedure.  Adaptic and a bulky dressing was applied at was an Ace wrap starting at the foot going up to the mid thigh.  The patient was ultimately extubated transfer the PACU without incident.  The end of the case all needle sponge counts were correct.  Venita Lick, MD 09/27/2020 11:53 PM

## 2020-09-27 NOTE — Anesthesia Procedure Notes (Signed)
Procedure Name: LMA Insertion Date/Time: 09/27/2020 11:01 PM Performed by: Sudie Grumbling, CRNA Pre-anesthesia Checklist: Patient identified, Emergency Drugs available, Suction available and Patient being monitored Patient Re-evaluated:Patient Re-evaluated prior to induction Oxygen Delivery Method: Circle system utilized Induction Type: IV induction LMA: LMA inserted LMA Size: 5.0 Number of attempts: 1 Tube secured with: Tape Dental Injury: Teeth and Oropharynx as per pre-operative assessment

## 2020-09-27 NOTE — ED Notes (Signed)
Unable to get patient vitals he can not sit still he keeps jumping up out the chair when trying to get vitals.  Patient stated "he is in so much pain he can't be still".  I made nurse aware

## 2020-09-27 NOTE — H&P (Signed)
Chief Complaint: Septic right knee History:  Kyle Pitts is a 60 y.o. male with no contributing medical history who presents with complaint of right knee pain and swelling which started 2 days ago. Has not had this problem before. 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 No other injury or strain. No fever, chills. He was seen at an outside ED yesterday and prescribed tramadol, states they told him  he does not have gout, presumably by a serum uric acid level. Since then pain and swelling have continued to increase. Denies IV drug use.   History reviewed. No pertinent past medical history.  Review of systems: No shortness of breath or chest pain.  No recent fevers, chills, weight loss.  No recent loss of consciousness, blurry vision.  Allergies  Allergen Reactions   Cephalexin    Penicillins     No current facility-administered medications on file prior to encounter.   No current outpatient medications on file prior to encounter.    Physical Exam: Vitals:   09/27/20 2211 09/27/20 2215  BP: (!) 89/65 (!) 89/65  Pulse: 87 87  Resp: 18   Temp:    SpO2: 97%    There is no height or weight on file to calculate BMI. He is alert and oriented x3. No shortness of breath or chest pain at present. Lungs are clear to auscultation Cardiac: Regular rate and rhythm no rubs gallops murmurs Abdomen is soft and nontender.  No rebound tenderness. Right lower extremity: Compartments are soft and nontender.  2+ dorsalis pedis/posterior tibialis pulses.  EHL/tibialis anterior/gastrocnemius 5/5 motor strength.  Intact sensation to light touch in the lower extremity.  Significant swelling of the right knee is noted.  No obvious laceration contusion or abrasion.  He does have a Band-Aid on the lateral aspect of the knee secondary to the aspiration.  No significant erythema is noted.  Kniffen pain with attempts at range of  motion. Left lower extremity is asymptomatic.  Image: DG CHEST PORT 1 VIEW  Result Date: 09/27/2020 CLINICAL DATA:  Preoperative chest x-ray EXAM: PORTABLE CHEST 1 VIEW COMPARISON:  March 14, 2008 FINDINGS: The heart size and mediastinal contours are within normal limits. Both lungs are clear. The visualized skeletal structures are unremarkable. IMPRESSION: No active disease. Electronically Signed   By: Gerome Sam III M.D   On: 09/27/2020 21:59   DG Knee Complete 4 Views Right  Result Date: 09/27/2020 CLINICAL DATA:  Right knee pain and swelling without recent injury. EXAM: RIGHT KNEE - COMPLETE 4+ VIEW COMPARISON:  None. FINDINGS: No fracture or dislocation is noted. Moderate suprapatellar joint effusion is noted. Moderate narrowing of medial joint space is noted. 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 Moderate suprapatellar joint effusion. No fracture or dislocation is noted. Electronically Signed   By: Lupita Raider M.D.   On: 09/27/2020 13:02    A/P: Kyle Pitts is a very pleasant 60 year old gentleman who was in his usual state of good health with chronic knee pain who presents with a 2-day history of significant knee swelling, difficulty walking, and pain with range of motion.  In the emergency room the knee was aspirated and was noted to have frankly purulent material.  As result orthopedic consultation was requested.  At this point time because of the evidence of a septic knee I will take the patient to the operating room for an arthroscopic irrigation and  debridement.  I have gone over the risks of surgery which include infection, bleeding, nerve damage, death, stroke, paralysis, need for additional surgery, ongoing or worse pain.  All of his questions were encouraged and addressed.  I will also consult the hospitalist for ongoing IV/oral antibiotic management.

## 2020-09-28 ENCOUNTER — Other Ambulatory Visit: Payer: Self-pay

## 2020-09-28 ENCOUNTER — Encounter (HOSPITAL_COMMUNITY): Payer: Self-pay | Admitting: Orthopedic Surgery

## 2020-09-28 ENCOUNTER — Inpatient Hospital Stay: Payer: Self-pay

## 2020-09-28 DIAGNOSIS — E669 Obesity, unspecified: Secondary | ICD-10-CM

## 2020-09-28 DIAGNOSIS — M009 Pyogenic arthritis, unspecified: Secondary | ICD-10-CM | POA: Diagnosis present

## 2020-09-28 DIAGNOSIS — M00261 Other streptococcal arthritis, right knee: Principal | ICD-10-CM

## 2020-09-28 LAB — CBC
HCT: 36.1 % — ABNORMAL LOW (ref 39.0–52.0)
Hemoglobin: 12.5 g/dL — ABNORMAL LOW (ref 13.0–17.0)
MCH: 35.9 pg — ABNORMAL HIGH (ref 26.0–34.0)
MCHC: 34.6 g/dL (ref 30.0–36.0)
MCV: 103.7 fL — ABNORMAL HIGH (ref 80.0–100.0)
Platelets: 95 10*3/uL — ABNORMAL LOW (ref 150–400)
RBC: 3.48 MIL/uL — ABNORMAL LOW (ref 4.22–5.81)
RDW: 12.4 % (ref 11.5–15.5)
WBC: 16 10*3/uL — ABNORMAL HIGH (ref 4.0–10.5)
nRBC: 0 % (ref 0.0–0.2)

## 2020-09-28 LAB — CREATININE, SERUM
Creatinine, Ser: 0.98 mg/dL (ref 0.61–1.24)
GFR, Estimated: >60 mL/min (ref >60)

## 2020-09-28 MED ORDER — ACETAMINOPHEN 10 MG/ML IV SOLN
1000.0000 mg | Freq: Once | INTRAVENOUS | Status: AC
  Administered 2020-09-28: 1000 mg via INTRAVENOUS

## 2020-09-28 MED ORDER — METHOCARBAMOL 500 MG IVPB - SIMPLE MED
500.0000 mg | Freq: Four times a day (QID) | INTRAVENOUS | Status: DC | PRN
  Filled 2020-09-28: qty 50

## 2020-09-28 MED ORDER — HYDROCODONE-ACETAMINOPHEN 5-325 MG PO TABS: 1.0000 | ORAL_TABLET | ORAL | Status: DC | PRN

## 2020-09-28 MED ORDER — PNEUMOCOCCAL VAC POLYVALENT 25 MCG/0.5ML IJ INJ
0.5000 mL | INJECTION | INTRAMUSCULAR | Status: DC
  Filled 2020-09-28: qty 0.5

## 2020-09-28 MED ORDER — METOCLOPRAMIDE HCL 5 MG/ML IJ SOLN: 5.0000 mg | Freq: Three times a day (TID) | INTRAMUSCULAR | Status: DC | PRN

## 2020-09-28 MED ORDER — HYDROCODONE-ACETAMINOPHEN 7.5-325 MG PO TABS
1.0000 | ORAL_TABLET | ORAL | Status: DC | PRN
  Administered 2020-09-28: 2 via ORAL
  Administered 2020-09-28 (×2): 1 via ORAL
  Administered 2020-09-28: 2 via ORAL
  Administered 2020-09-29 – 2020-09-30 (×6): 1 via ORAL
  Administered 2020-09-30 (×2): 2 via ORAL
  Administered 2020-09-30 – 2020-10-01 (×2): 1 via ORAL
  Administered 2020-10-01: 2 via ORAL
  Administered 2020-10-01 (×2): 1 via ORAL
  Administered 2020-10-02 (×3): 2 via ORAL
  Filled 2020-09-28 (×4): qty 1
  Filled 2020-09-28 (×2): qty 2
  Filled 2020-09-28 (×4): qty 1
  Filled 2020-09-28 (×2): qty 2
  Filled 2020-09-28 (×4): qty 1
  Filled 2020-09-28 (×4): qty 2

## 2020-09-28 MED ORDER — FENTANYL CITRATE (PF) 100 MCG/2ML IJ SOLN: 25.0000 ug | INTRAMUSCULAR | Status: DC | PRN

## 2020-09-28 MED ORDER — ONDANSETRON HCL 4 MG PO TABS: 4.0000 mg | ORAL_TABLET | Freq: Four times a day (QID) | ORAL | Status: DC | PRN

## 2020-09-28 MED ORDER — ENOXAPARIN SODIUM 40 MG/0.4ML IJ SOSY
40.0000 mg | PREFILLED_SYRINGE | INTRAMUSCULAR | Status: DC
  Administered 2020-09-28 – 2020-10-01 (×4): 40 mg via SUBCUTANEOUS
  Filled 2020-09-28 (×4): qty 0.4

## 2020-09-28 MED ORDER — POLYETHYLENE GLYCOL 3350 17 G PO PACK
17.0000 g | PACK | Freq: Every day | ORAL | Status: DC | PRN
  Administered 2020-10-01: 17 g via ORAL
  Filled 2020-09-28 (×2): qty 1

## 2020-09-28 MED ORDER — ACETAMINOPHEN 325 MG PO TABS: 325.0000 mg | ORAL_TABLET | Freq: Four times a day (QID) | ORAL | Status: DC | PRN

## 2020-09-28 MED ORDER — VANCOMYCIN HCL 1000 MG/200ML IV SOLN
1000.0000 mg | Freq: Two times a day (BID) | INTRAVENOUS | Status: AC
  Administered 2020-09-28: 1000 mg via INTRAVENOUS
  Filled 2020-09-28: qty 200

## 2020-09-28 MED ORDER — LACTATED RINGERS IV SOLN: INTRAVENOUS | Status: DC

## 2020-09-28 MED ORDER — METHOCARBAMOL 500 MG PO TABS
500.0000 mg | ORAL_TABLET | Freq: Four times a day (QID) | ORAL | Status: DC | PRN
  Administered 2020-09-28 – 2020-10-02 (×12): 500 mg via ORAL
  Filled 2020-09-28 (×12): qty 1

## 2020-09-28 MED ORDER — MORPHINE SULFATE (PF) 2 MG/ML IV SOLN: 0.5000 mg | INTRAVENOUS | Status: DC | PRN

## 2020-09-28 MED ORDER — DOCUSATE SODIUM 100 MG PO CAPS
100.0000 mg | ORAL_CAPSULE | Freq: Two times a day (BID) | ORAL | Status: DC
  Administered 2020-09-28 – 2020-10-02 (×9): 100 mg via ORAL
  Filled 2020-09-28 (×9): qty 1

## 2020-09-28 MED ORDER — AMISULPRIDE (ANTIEMETIC) 5 MG/2ML IV SOLN: 10.0000 mg | Freq: Once | INTRAVENOUS | Status: DC | PRN

## 2020-09-28 MED ORDER — ACETAMINOPHEN 500 MG PO TABS
500.0000 mg | ORAL_TABLET | Freq: Four times a day (QID) | ORAL | Status: AC
  Administered 2020-09-28 (×3): 500 mg via ORAL
  Filled 2020-09-28 (×3): qty 1

## 2020-09-28 MED ORDER — ONDANSETRON HCL 4 MG/2ML IJ SOLN: 4.0000 mg | Freq: Four times a day (QID) | INTRAMUSCULAR | Status: DC | PRN

## 2020-09-28 MED ORDER — METOCLOPRAMIDE HCL 5 MG PO TABS: 5.0000 mg | ORAL_TABLET | Freq: Three times a day (TID) | ORAL | Status: DC | PRN

## 2020-09-28 MED ORDER — PROMETHAZINE HCL 25 MG/ML IJ SOLN: 6.2500 mg | INTRAMUSCULAR | Status: DC | PRN

## 2020-09-28 MED ORDER — SODIUM CHLORIDE 0.9 % IV SOLN
2.0000 g | INTRAVENOUS | Status: DC
  Administered 2020-09-28 – 2020-10-02 (×5): 2 g via INTRAVENOUS
  Filled 2020-09-28: qty 2
  Filled 2020-09-28: qty 20
  Filled 2020-09-28: qty 2
  Filled 2020-09-28: qty 20
  Filled 2020-09-28 (×2): qty 2

## 2020-09-28 NOTE — TOC Initial Note (Signed)
Transition of Care [facility]) - Initial/Assessment Note    Patient Details  Name: Kyle Pitts MRN: [id number] Date of Birth: 03-16-1961  Transition of Care [facility] CM/SW Contact:    Ida Rogue, LCSW Phone Number: 09/28/2020, 2:29 PM  Clinical Narrative:   Alerted by nurse that patient is getting PICC line today for home IV abx, and that she had alerted Pam with [employer] as well.  Pam will see him today, but will not be able to have him set up until Monday due to working with a no insurance situation. TOC will continue to follow during the course of hospitalization.                 Expected Discharge Plan: Home w Home Health Services Barriers to Discharge: No Barriers Identified   Patient Goals and CMS Choice        Expected Discharge Plan and Services Expected Discharge Plan: Home w Home Health Services                                              Prior Living Arrangements/Services                       Activities of Daily Living Home Assistive Devices/Equipment: None ADL Screening (condition at time of admission) Patient's cognitive ability adequate to safely complete daily activities?: Yes Is the patient deaf or have difficulty hearing?: No Does the patient have difficulty seeing, even when wearing glasses/contacts?: No Does the patient have difficulty concentrating, remembering, or making decisions?: No Patient able to express need for assistance with ADLs?: Yes Does the patient have difficulty dressing or bathing?: No Independently performs ADLs?: Yes (appropriate for developmental age) Does the patient have difficulty walking or climbing stairs?: No Weakness of Legs: Right Weakness of Arms/Hands: None  Permission Sought/Granted                  Emotional Assessment              Admission diagnosis:  Pre-op chest exam [Z01.811] Pyogenic arthritis of right knee joint, due to unspecified organism (HCC) [M00.9] Septic arthritis of  knee (HCC) [M00.9] Patient Active Problem List   Diagnosis Date Noted   Septic arthritis of knee (HCC) 09/28/2020   ALLERGIC RHINITIS 01/13/2007   ASTHMA 01/13/2007   PCP:  [name], MD Pharmacy:   [facility] [phone] [place], [place] - [address] AT [address] [place] [address] Phone: [phone] Fax: [phone]     Social Determinants of Health (SDOH) Interventions    Readmission Risk Interventions No flowsheet data found.

## 2020-09-28 NOTE — Evaluation (Signed)
Physical Therapy Evaluation Patient Details Name: Kyle Pitts MRN: [id number] DOB: 05-16-1960 Today's Date: 09/28/2020   History of Present Illness  60 y.o. male who presents to the emergency room with 2-day history of significant knee pain, swelling, and difficulty ambulating and moving the knee.  Aspiration revealed purulent material and so orthopedic consultation was requested.  Patient was brought the operating room for an arthroscopic I&D of a septic right knee.  Clinical Impression  Pt admitted with above diagnosis.  Pt mobilizing well however heavily reliant on UEs d/t RLE pain.  Will continue to follow.t  Pt currently with functional limitations due to the deficits listed below (see PT Problem List). Pt will benefit from skilled PT to increase their independence and safety with mobility to allow discharge to the venue listed below.       Follow Up Recommendations Home health PT    Equipment Recommendations  Rolling walker with 5" wheels    Recommendations for Other Services       Precautions / Restrictions Precautions Precautions: Fall Restrictions Weight Bearing Restrictions: No RLE Weight Bearing: Weight bearing as tolerated      Mobility  Bed Mobility Overal bed mobility: Needs Assistance Bed Mobility: Supine to Sit     Supine to sit: Min assist     General bed mobility comments: assist with RLE    Transfers Overall transfer level: Needs assistance Equipment used: Rolling walker (2 wheeled) Transfers: Sit to/from Stand Sit to Stand: Min guard;From elevated surface         General transfer comment: cues for hand placement  Ambulation/Gait Ambulation/Gait assistance: Min assist Gait Distance (Feet): 60 Feet Assistive device: Rolling walker (2 wheeled) Gait Pattern/deviations: Step-to pattern     General Gait Details: cues for sequence and RW position. heavy use of UEs d/t RLE pain with WBing  Stairs            Wheelchair Mobility     Modified Rankin (Stroke Patients Only)       Balance                                             Pertinent Vitals/Pain Pain Assessment: 0-10 Pain Score: 6  Pain Descriptors / Indicators: Grimacing;Discomfort;Sore Pain Intervention(s): Limited activity within patient's tolerance;Monitored during session;Premedicated before session;Repositioned    Home Living Family/patient expects to be discharged to:: Private residence Living Arrangements: Alone Available Help at Discharge: Family Type of Home: House Home Access: Stairs to enter   Secretary/administrator of Steps: 4 Home Layout: One level Home Equipment: None      Prior Function Level of Independence: Independent               Hand Dominance        Extremity/Trunk Assessment   Upper Extremity Assessment Upper Extremity Assessment: Overall WFL for tasks assessed    Lower Extremity Assessment Lower Extremity Assessment: RLE deficits/detail RLE Deficits / Details: ankle WFL, knee ROM limited by pain, strength grossly 3/5       Communication   Communication: No difficulties  Cognition Arousal/Alertness: Awake/alert Behavior During Therapy: WFL for tasks assessed/performed Overall Cognitive Status: Within Functional Limits for tasks assessed  General Comments      Exercises General Exercises - Lower Extremity Ankle Circles/Pumps: AROM;Both;10 reps   Assessment/Plan    PT Assessment Patient needs continued PT services  PT Problem List Decreased strength;Decreased activity tolerance;Decreased mobility;Pain;Decreased knowledge of use of DME       PT Treatment Interventions DME instruction;Gait training;Functional mobility training;Therapeutic activities;Patient/family education;Therapeutic exercise    PT Goals (Current goals can be found in the Care Plan section)  Acute Rehab PT Goals Patient Stated Goal: less knee pain PT  Goal Formulation: With patient Time For Goal Achievement: 10/05/20 Potential to Achieve Goals: Good    Frequency 7X/week   Barriers to discharge        Co-evaluation               AM-PAC PT "6 Clicks" Mobility  Outcome Measure Help needed turning from your back to your side while in a flat bed without using bedrails?: A Little Help needed moving from lying on your back to sitting on the side of a flat bed without using bedrails?: A Little Help needed moving to and from a bed to a chair (including a wheelchair)?: A Little Help needed standing up from a chair using your arms (e.g., wheelchair or bedside chair)?: A Little Help needed to walk in hospital room?: A Little Help needed climbing 3-5 steps with a railing? : A Little 6 Click Score: 18    End of Session Equipment Utilized During Treatment: Gait belt Activity Tolerance: Patient tolerated treatment well Patient left: with call bell/phone within reach;with family/visitor present;in chair;with chair alarm set   PT Visit Diagnosis: Other abnormalities of gait and mobility (R26.89);Difficulty in walking, not elsewhere classified (R26.2)    Time: 1206-1225 PT Time Calculation (min) (ACUTE ONLY): 19 min   Charges:   PT Evaluation $PT Eval Low Complexity: 1 Low          Shama Monfils, PT  Acute Rehab Dept ([facility]) [phone] Pager [phone]  09/28/2020   [facility] 09/28/2020, 1:36 PM

## 2020-09-28 NOTE — Progress Notes (Signed)
Orthopedics Progress Note  Subjective: Patient reports some improvement in pain but it still hurts with movement  Objective:  Vitals:   09/28/20 0515 09/28/20 0924  BP: 129/80 (!) 130/94  Pulse: 90 90  Resp: 16 18  Temp: 97.8 F (36.6 C)   SpO2: 96% 96%    General: Awake and alert  Musculoskeletal: Right leg dressing in place and CDI. No pain with ankle pumps but he does have pain and guarding with knee AROM Neurovascularly intact  Lab Results  Component Value Date   WBC 16.0 (H) 09/28/2020   HGB 12.5 (L) 09/28/2020   HCT 36.1 (L) 09/28/2020   MCV 103.7 (H) 09/28/2020   PLT 95 (L) 09/28/2020       Component Value Date/Time   NA 123 (L) 09/27/2020 1839   K 4.0 09/27/2020 1839   CL 88 (L) 09/27/2020 1839   CO2 16 (L) 09/27/2020 1839   GLUCOSE 85 09/27/2020 1839   BUN 16 09/27/2020 1839   CREATININE 0.98 09/28/2020 0312   CALCIUM 8.9 09/27/2020 1839   GFRNONAA >60 09/28/2020 0312   GFRAA  05/13/2010 1718    >60        The eGFR has been calculated using the MDRD equation. This calculation has not been validated in all clinical situations. eGFR's persistently <60 mL/min signify possible Chronic Kidney Disease.    Lab Results  Component Value Date   INR 1.1 09/27/2020    Assessment/Plan: POD #1 s/p Procedure(s): ARTHROSCOPY KNEE, [place] OUT FOR SEPTIC ARTHRITIS Awaiting culture results and ID consult for specific Abx recommendations. Empiric Vanc for now.  PICC line planned for today.  Will follow  Doran Heater. Veverly Fells, MD 09/28/2020 10:18 AM

## 2020-09-28 NOTE — Plan of Care (Signed)
Plan of care discussed.   

## 2020-09-28 NOTE — Consult Note (Signed)
Date of Admission:  09/27/2020          Reason for Consult: Right knee septic arthritis  Referring Provider: Dr. 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 Cephalexin intolerance with GI upset  Plan:  Add ceftriaxone to vancomycin for now for empiric coverage Likely switch him to daptomycin in am if still needing empiric or specific coverage for MRSA, MRSE PICC He will need charity care to obtain IV [facility] at [facility] consult social work discuss with [place] He is NOT going to be able to return to his usual type of work on his knees climbing ladders etc to protect knee and PICC I will screen for HIV, viral hepatides ESR, CRP   Active Problems:   Septic arthritis of knee (HCC)   Scheduled Meds:  acetaminophen  500 mg Oral Q6H   docusate sodium  100 mg Oral BID   enoxaparin (LOVENOX) injection  40 mg Subcutaneous Q24H   [START ON 09/29/2020] pneumococcal 23 valent vaccine  0.5 mL Intramuscular Tomorrow-1000   Continuous Infusions:  cefTRIAXone (ROCEPHIN)  IV     lactated ringers Stopped (09/28/20 1018)   methocarbamol (ROBAXIN) IV     vancomycin 150 mL/hr at 09/28/20 1036   PRN Meds:.[START ON 09/29/2020] acetaminophen, HYDROcodone-acetaminophen, HYDROcodone-acetaminophen, methocarbamol **OR** methocarbamol (ROBAXIN) IV, metoCLOPramide **OR** metoCLOPramide (REGLAN) injection, morphine injection, ondansetron **OR** ondansetron (ZOFRAN) IV, polyethylene glycol  HPI: Kyle Pitts is a 60 y.o. male previously healthy who works Soil scientist, Proofreader, pressure washing had noticed fairly acute onset of swelling in right knee last Friday that worsened in past 2 days. He was seen at another ER where told he did not have gout. Pain persisted and he came to [facility] ER. Of note he did tell me that his knee has hx of chronic intermittent swelling int he past at end of day of work.   He was  seen in ER and joint aspirated with frank pus found. Cell count with 129280 WBC, 81% PMNs. He was taken to the OR by Dr. Rolena Infante who  performed arthroscopic I and D. GS read with GP  cocci in  pairs and chains, likely c/w streptococcal infection.  He has been on vancomycin. I am adding ceftriaxone since he is NOT actually allergic to cephalosporins.  If this is a streptococcal species can likely use ceftriaxone alone. I am keeping MRSA, MRSE coverage for now in case culture grows something different than gram stain is suggesting it will.  One could also consider an amoxicillin  challenge but would prefer to do this on Monday when our full complement of ID pharmacists are here   He told me he has several members with his family who have had kidney disease.  I spent more than 80  minutes with the patient including greater than 50% of time in face to face counseling of the patient personally reviewing radiographs, along with pertinent laboratory microbiological data review of medical records and in coordination of his care.   Review of Systems: Review of Systems  Constitutional:  Negative for chills, diaphoresis, fever, malaise/fatigue and weight loss.  HENT:  Negative for congestion, hearing loss, sore throat and tinnitus.   Eyes:  Negative for blurred vision and double vision.  Respiratory:  Negative for cough, sputum production, shortness of breath and wheezing.   Cardiovascular:  Negative for chest pain, palpitations and leg swelling.  Gastrointestinal:  Negative for abdominal  pain, blood in stool, constipation, diarrhea, heartburn, melena, nausea and vomiting.  Genitourinary:  Negative for dysuria, flank pain and hematuria.  Musculoskeletal:  Positive for joint pain and myalgias. Negative for back pain and falls.  Skin:  Negative for itching and rash.  Neurological:  Negative for dizziness, sensory change, focal weakness, loss of consciousness, weakness and headaches.  Endo/Heme/Allergies:   Does not bruise/bleed easily.  Psychiatric/Behavioral:  Negative for depression, memory loss and suicidal ideas. The patient is not nervous/anxious.    History reviewed. No pertinent past medical history.  Social History   Tobacco Use   Smoking status: Never   Smokeless tobacco: Never  Substance Use Topics   Alcohol use: Yes    Alcohol/week: 24.0 standard drinks    Types: 24 Cans of beer per week    Comment: 3-4 when home from work   Drug use: Not Currently    Types: Cocaine, Marijuana    Comment: last used 1 month ago    History reviewed. No pertinent family history. Allergies  Allergen Reactions   Cephalexin Nausea Only    Upset stomach   Penicillins Rash    OBJECTIVE: Blood pressure (!) 130/94, pulse 90, temperature 97.8 F (36.6 C), temperature source Oral, resp. rate 18, height 6' (1.829 m), weight 103.9 kg, SpO2 96 %.  Physical Exam Constitutional:      Appearance: He is well-developed.  HENT:     Head: Normocephalic and atraumatic.  Eyes:     Conjunctiva/sclera: Conjunctivae normal.  Cardiovascular:     Rate and Rhythm: Normal rate and regular rhythm.  Pulmonary:     Effort: Pulmonary effort is normal. No respiratory distress.     Breath sounds: No wheezing.  Abdominal:     General: There is no distension.     Palpations: Abdomen is soft.  Musculoskeletal:        General: No tenderness. Normal range of motion.     Cervical back: Normal range of motion and neck supple.  Skin:    General: Skin is warm and dry.     Coloration: Skin is not pale.     Findings: No erythema or rash.  Neurological:     General: No focal deficit present.     Mental Status: He is alert and oriented to person, place, and time.  Psychiatric:        Mood and Affect: Mood normal.        Behavior: Behavior normal.        Thought Content: Thought content normal.        Judgment: Judgment normal.   Knee bandaged  Lab Results Lab Results  Component Value Date   WBC 16.0 (H)  09/28/2020   HGB 12.5 (L) 09/28/2020   HCT 36.1 (L) 09/28/2020   MCV 103.7 (H) 09/28/2020   PLT 95 (L) 09/28/2020    Lab Results  Component Value Date   CREATININE 0.98 09/28/2020   BUN 16 09/27/2020   NA 123 (L) 09/27/2020   K 4.0 09/27/2020   CL 88 (L) 09/27/2020   CO2 16 (L) 09/27/2020    Lab Results  Component Value Date   ALT 59 (H) 09/27/2020   AST 107 (H) 09/27/2020   ALKPHOS 89 09/27/2020   BILITOT 2.6 (H) 09/27/2020     Microbiology: Recent Results (from the past 240 hour(s))  Body fluid culture w Gram Stain     Status: None (Preliminary result)   Collection Time: 09/27/20  7:17 PM   Specimen: KNEE;  Body Fluid  Result Value Ref Range Status   Specimen Description   Final    KNEE RIGHT SYNOVIAL Performed at [facility] [address], [place], [place] [address]    Special Requests   Final    NONE Performed at [facility], [place] [address] [place], [place] [address]    Gram Stain   Final    ABUNDANT WBC PRESENT,BOTH PMN AND MONONUCLEAR ABUNDANT GRAM POSITIVE COCCI IN PAIRS AND CHAINS    Culture   Final    TOO YOUNG TO READ Performed at [facility], [place] [address], [place], [place] [address]    Report Status PENDING  Incomplete  Resp Panel by RT-PCR (Flu A&B, Covid) Nasopharyngeal Swab     Status: None   Collection Time: 09/27/20  8:46 PM   Specimen: Nasopharyngeal Swab; Nasopharyngeal(NP) swabs in vial transport medium  Result Value Ref Range Status   SARS Coronavirus 2 by RT PCR NEGATIVE NEGATIVE Final    Comment: (NOTE) SARS-CoV-2 target nucleic acids are NOT DETECTED.  The SARS-CoV-2 RNA is generally detectable in upper respiratory specimens during the acute phase of infection. The lowest concentration of SARS-CoV-2 viral copies this assay can detect is 138 copies/mL. A negative result does not preclude SARS-Cov-2 infection and should not be used as the sole basis for treatment or other patient  management decisions. A negative result may occur with  improper specimen collection/handling, submission of specimen other than nasopharyngeal swab, presence of viral mutation(s) within the areas targeted by this assay, and inadequate number of viral copies(<138 copies/mL). A negative result must be combined with clinical observations, patient history, and epidemiological information. The expected result is Negative.  Fact Sheet for Patients:  EntrepreneurPulse.com.au  Fact Sheet for Healthcare Providers:  IncredibleEmployment.be  This test is no t yet approved or cleared by the [place] FDA and  has been authorized for detection and/or diagnosis of SARS-CoV-2 by FDA under an Emergency Use Authorization (EUA). This EUA will remain  in effect (meaning this test can be used) for the duration of the COVID-19 declaration under Section 564(b)(1) of the Act, 21 U.S.C.section 360bbb-3(b)(1), unless the authorization is terminated  or revoked sooner.       Influenza A by PCR NEGATIVE NEGATIVE Final   Influenza B by PCR NEGATIVE NEGATIVE Final    Comment: (NOTE) The Xpert Xpress SARS-CoV-2/FLU/RSV plus assay is intended as an aid in the diagnosis of influenza from Nasopharyngeal swab specimens and should not be used as a sole basis for treatment. Nasal washings and aspirates are unacceptable for Xpert Xpress SARS-CoV-2/FLU/RSV testing.  Fact Sheet for Patients: EntrepreneurPulse.com.au  Fact Sheet for Healthcare Providers: IncredibleEmployment.be  This test is not yet approved or cleared by the [place] FDA and has been authorized for detection and/or diagnosis of SARS-CoV-2 by FDA under an Emergency Use Authorization (EUA). This EUA will remain in effect (meaning this test can be used) for the duration of the COVID-19 declaration under Section 564(b)(1) of the Act, 21 U.S.C. section 360bbb-3(b)(1),  unless the authorization is terminated or revoked.  Performed at [facility], [place] [address], [place], [place] [address]     Alcide Evener, [place] [facility] [phone] pager  09/28/2020, 10:55 AM

## 2020-09-28 NOTE — Progress Notes (Signed)
Current plan to place PICC 6/12 am.  Spoke with Victorino Dike RN to confirm d/c plans.  HH still to be arranged.  Victorino Dike RN to secure chat if arrangements made for d/c home today.

## 2020-09-28 NOTE — Plan of Care (Signed)
  Problem: Clinical Measurements: Goal: Diagnostic test results will improve Outcome: Progressing   Problem: Clinical Measurements: Goal: Respiratory complications will improve Outcome: Progressing   Problem: Clinical Measurements: Goal: Cardiovascular complication will be avoided Outcome: Progressing   Problem: Pain Managment: Goal: General experience of comfort will improve Outcome: Progressing   

## 2020-09-28 NOTE — Anesthesia Postprocedure Evaluation (Addendum)
Anesthesia Post Note  Patient: Kyle Pitts  Procedure(s) Performed: ARTHROSCOPY KNEE, WASH OUT (Right: Knee)     Patient location during evaluation: PACU Anesthesia Type: General Level of consciousness: sedated Pain management: pain level controlled Vital Signs Assessment: post-procedure vital signs reviewed and stable Respiratory status: spontaneous breathing and respiratory function stable Cardiovascular status: stable Postop Assessment: no apparent nausea or vomiting Anesthetic complications: no   No notable events documented.                Sheyla Zaffino DANIEL

## 2020-09-28 NOTE — Progress Notes (Signed)
Physical Therapy Treatment Patient Details Name: Kyle Pitts MRN: [id number] DOB: [birth year] Today's Date: 09/28/2020    History of Present Illness 60 y.o. male who presents to the emergency room with 2-day history of significant knee pain, swelling, and difficulty ambulating and moving the knee.  Aspiration revealed purulent material and so orthopedic consultation was requested.  Patient was brought the operating room for an arthroscopic I&D of a septic right knee.    PT Comments    Pt progressing with mobility. Incr ROM R knee with improved  wt shift to R, incr knee extension and heel contact during gait. Continue to follow   Follow Up Recommendations  Home health PT (vs OPPT)     Equipment Recommendations  Rolling walker with 5" wheels    Recommendations for Other Services       Precautions / Restrictions Precautions Precautions: Fall Restrictions Weight Bearing Restrictions: No RLE Weight Bearing: Weight bearing as tolerated    Mobility  Bed Mobility Overal bed mobility: Needs Assistance Bed Mobility: Sit to Supine     Supine to sit: Min assist Sit to supine: Min assist   General bed mobility comments: assist with RLE    Transfers Overall transfer level: Needs assistance Equipment used: Rolling walker (2 wheeled) Transfers: Sit to/from Stand Sit to Stand: Min guard         General transfer comment: cues for hand placement  Ambulation/Gait Ambulation/Gait assistance: Min guard Gait Distance (Feet): 80 Feet Assistive device: Rolling walker (2 wheeled) Gait Pattern/deviations: Step-to pattern;Decreased stance time - right     General Gait Details: cues for sequence and RW position. heavy use of UEs d/t RLE pain with WBing; wt shift to RLE, knee extension and heel contact during stance on RLE improved   Stairs             Wheelchair Mobility    Modified Rankin (Stroke Patients Only)       Balance                                             Cognition Arousal/Alertness: Awake/alert Behavior During Therapy: WFL for tasks assessed/performed Overall Cognitive Status: Within Functional Limits for tasks assessed                                        Exercises General Exercises - Lower Extremity Ankle Circles/Pumps: AROM;Both;10 reps Quad Sets: AROM;Right;10 reps Heel Slides: AAROM;Right;10 reps    General Comments        Pertinent Vitals/Pain Pain Assessment: Faces Pain Score: 6  Faces Pain Scale: Hurts whole lot Pain Location: R knee Pain Descriptors / Indicators: Grimacing;Discomfort;Sore Pain Intervention(s): Limited activity within patient's tolerance;Monitored during session;RN gave pain meds during session;Repositioned    Home Living Family/patient expects to be discharged to:: Private residence Living Arrangements: Alone Available Help at Discharge: Family Type of Home: House Home Access: Stairs to enter   Home Layout: One level Home Equipment: None      Prior Function Level of Independence: Independent          PT Goals (current goals can now be found in the care plan section) Acute Rehab PT Goals Patient Stated Goal: less knee pain PT Goal Formulation: With patient Time For Goal Achievement: 10/05/20 Potential to Achieve Goals:  Good Progress towards PT goals: Progressing toward goals    Frequency    7X/week      PT Plan Current plan remains appropriate    Co-evaluation              AM-PAC PT "6 Clicks" Mobility   Outcome Measure  Help needed turning from your back to your side while in a flat bed without using bedrails?: A Little Help needed moving from lying on your back to sitting on the side of a flat bed without using bedrails?: A Little Help needed moving to and from a bed to a chair (including a wheelchair)?: A Little Help needed standing up from a chair using your arms (e.g., wheelchair or bedside chair)?: A Little Help needed to  walk in hospital room?: A Little Help needed climbing 3-5 steps with a railing? : A Little 6 Click Score: 18    End of Session Equipment Utilized During Treatment: Gait belt Activity Tolerance: Patient tolerated treatment well Patient left: in bed;with call bell/phone within reach;with bed alarm set;with family/visitor present   PT Visit Diagnosis: Other abnormalities of gait and mobility (R26.89);Difficulty in walking, not elsewhere classified (R26.2)     Time: 6659-9357 PT Time Calculation (min) (ACUTE ONLY): 22 min  Charges:  $Gait Training: 8-22 mins                     Delice Bison, PT  Acute Rehab Dept ([facility]) [phone] Pager [phone]  09/28/2020    [facility] 09/28/2020, 5:07 PM

## 2020-09-28 NOTE — Transfer of Care (Signed)
Immediate Anesthesia Transfer of Care Note  Patient: Kyle Pitts  Procedure(s) Performed: ARTHROSCOPY KNEE, WASH OUT (Right: Knee)  Patient Location: PACU  Anesthesia Type:General  Level of Consciousness: awake, alert  and oriented  Airway & Oxygen Therapy: Patient Spontanous Breathing and Patient connected to face mask  Post-op Assessment: Report given to RN and Post -op Vital signs reviewed and stable  Post vital signs: Reviewed and stable  Last Vitals:  Vitals Value Taken Time  BP 126/87 09/27/20 2358  Temp    Pulse 90 09/28/20 0000  Resp 21 09/28/20 0000  SpO2 96 % 09/28/20 0000  Vitals shown include unvalidated device data.  Last Pain:  Vitals:   09/27/20 1924  TempSrc:   PainSc: 0-No pain         Complications: No notable events documented.

## 2020-09-29 ENCOUNTER — Encounter (HOSPITAL_COMMUNITY): Payer: Self-pay | Admitting: Orthopedic Surgery

## 2020-09-29 LAB — GLUCOSE, BODY FLUID OTHER: Glucose, Body Fluid Other: 10 mg/dL

## 2020-09-29 LAB — HEPATITIS C ANTIBODY: HCV Ab: NONREACTIVE

## 2020-09-29 LAB — SEDIMENTATION RATE: Sed Rate: 23 mm/hr — ABNORMAL HIGH (ref 0–16)

## 2020-09-29 LAB — C-REACTIVE PROTEIN: CRP: 27.4 mg/dL — ABNORMAL HIGH (ref <1.0)

## 2020-09-29 LAB — HEPATITIS B SURFACE ANTIGEN: Hepatitis B Surface Ag: NONREACTIVE

## 2020-09-29 LAB — PROTEIN, BODY FLUID (OTHER): Total Protein, Body Fluid Other: 4.9 g/dL

## 2020-09-29 MED ORDER — SODIUM CHLORIDE 0.9 % IV SOLN
1000.0000 mg | Freq: Every day | INTRAVENOUS | Status: DC
  Administered 2020-09-29: 1000 mg via INTRAVENOUS
  Filled 2020-09-29: qty 20

## 2020-09-29 MED ORDER — SODIUM CHLORIDE 0.9% FLUSH
10.0000 mL | INTRAVENOUS | Status: DC | PRN
  Administered 2020-10-02: 10 mL

## 2020-09-29 MED ORDER — CHLORHEXIDINE GLUCONATE CLOTH 2 % EX PADS
6.0000 | MEDICATED_PAD | Freq: Every day | CUTANEOUS | Status: DC
  Administered 2020-09-29 – 2020-10-02 (×4): 6 via TOPICAL

## 2020-09-29 NOTE — Progress Notes (Addendum)
Subjective: No new complaints   Antibiotics:  Anti-infectives (From admission, onward)    Start     Dose/Rate Route Frequency Ordered Stop   09/29/20 1200  DAPTOmycin (CUBICIN) 1,000 mg in sodium chloride 0.9 % IVPB        1,000 mg 140 mL/hr over 30 Minutes Intravenous Daily 09/29/20 1038     09/28/20 1100  vancomycin (VANCOREADY) IVPB 1000 mg/200 mL        1,000 mg 200 mL/hr over 60 Minutes Intravenous Every 12 hours 09/28/20 0059 09/28/20 1141   09/28/20 1100  cefTRIAXone (ROCEPHIN) 2 g in sodium chloride 0.9 % 100 mL IVPB        2 g 200 mL/hr over 30 Minutes Intravenous Every 24 hours 09/28/20 0912     09/27/20 2245  vancomycin (VANCOREADY) IVPB 1500 mg/300 mL        1,500 mg 150 mL/hr over 120 Minutes Intravenous  Once 09/27/20 2241 09/27/20 2244   09/27/20 2237  vancomycin (VANCOCIN) 1-5 GM/200ML-% IVPB       Note to Pharmacy: Margurite Auerbach   : cabinet override      09/27/20 2237 09/27/20 2244       Medications: Scheduled Meds:  Chlorhexidine Gluconate Cloth  6 each Topical Daily   docusate sodium  100 mg Oral BID   enoxaparin (LOVENOX) injection  40 mg Subcutaneous Q24H   pneumococcal 23 valent vaccine  0.5 mL Intramuscular Tomorrow-1000   Continuous Infusions:  cefTRIAXone (ROCEPHIN)  IV 200 mL/hr at 09/29/20 1017   DAPTOmycin (CUBICIN)  IV 1,000 mg (09/29/20 1153)   lactated ringers Stopped (09/29/20 1005)   methocarbamol (ROBAXIN) IV     PRN Meds:.acetaminophen, HYDROcodone-acetaminophen, HYDROcodone-acetaminophen, methocarbamol **OR** methocarbamol (ROBAXIN) IV, metoCLOPramide **OR** metoCLOPramide (REGLAN) injection, morphine injection, ondansetron **OR** ondansetron (ZOFRAN) IV, polyethylene glycol, sodium chloride flush    Objective: Weight change:   Intake/Output Summary (Last 24 hours) at 09/29/2020 1409 Last data filed at 09/29/2020 1017 Gross per 24 hour  Intake 4094.25 ml  Output 1700 ml  Net 2394.25 ml   Blood pressure (!) 148/91,  pulse 82, temperature 98.2 F (36.8 C), temperature source Oral, resp. rate 17, height 6' (1.829 m), weight 103.9 kg, SpO2 97 %. Temp:  [97.9 F (36.6 C)-98.2 F (36.8 C)] 98.2 F (36.8 C) (06/12 1346) Pulse Rate:  [82-91] 82 (06/12 1346) Resp:  [15-17] 17 (06/12 1346) BP: (105-148)/(66-91) 148/91 (06/12 1346) SpO2:  [96 %-98 %] 97 % (06/12 1346)  Physical Exam: Physical Exam Constitutional:      Appearance: He is well-developed.  HENT:     Head: Normocephalic and atraumatic.  Eyes:     Conjunctiva/sclera: Conjunctivae normal.  Cardiovascular:     Rate and Rhythm: Normal rate and regular rhythm.  Pulmonary:     Effort: Pulmonary effort is normal. No respiratory distress.     Breath sounds: No wheezing.  Abdominal:     General: There is no distension.     Palpations: Abdomen is soft.  Musculoskeletal:        General: Normal range of motion.     Cervical back: Normal range of motion and neck supple.  Skin:    General: Skin is warm and dry.     Findings: No erythema or rash.  Neurological:     General: No focal deficit present.     Mental Status: He is alert and oriented to person, place, and time.  Psychiatric:  Mood and Affect: Mood normal.        Behavior: Behavior normal.        Thought Content: Thought content normal.        Judgment: Judgment normal.    Right knee bandaged  PICC line clean  CBC:    BMET Recent Labs    09/27/20 1839 09/28/20 0312  NA 123*  --   K 4.0  --   CL 88*  --   CO2 16*  --   GLUCOSE 85  --   BUN 16  --   CREATININE 1.62* 0.98  CALCIUM 8.9  --      Liver Panel  Recent Labs    09/27/20 1839  PROT 7.9  ALBUMIN 3.8  AST 107*  ALT 59*  ALKPHOS 89  BILITOT 2.6*       Sedimentation Rate Recent Labs    09/29/20 0339  ESRSEDRATE 23*   C-Reactive Protein Recent Labs    09/27/20 1839 09/29/20 0339  CRP 9.9* 27.4*    Micro Results: Recent Results (from the past 720 hour(s))  Body fluid culture w  Gram Stain     Status: None (Preliminary result)   Collection Time: 09/27/20  7:17 PM   Specimen: KNEE; Body Fluid  Result Value Ref Range Status   Specimen Description   Final    KNEE RIGHT SYNOVIAL Performed at [facility] [address] [place], [place] [address]    Special Requests   Final    NONE Performed at [facility], [place] [address] [place], [place] [address]    Gram Stain   Final    ABUNDANT WBC PRESENT,BOTH PMN AND MONONUCLEAR ABUNDANT GRAM POSITIVE COCCI IN PAIRS AND CHAINS    Culture   Final    ABUNDANT STREPTOCOCCUS PYOGENES Beta hemolytic streptococci are predictably susceptible to penicillin and other beta lactams. Susceptibility testing not routinely performed. Performed at [facility], [place] [address], [place], [place] [address]    Report Status PENDING  Incomplete  Resp Panel by RT-PCR (Flu A&B, Covid) Nasopharyngeal Swab     Status: None   Collection Time: 09/27/20  8:46 PM   Specimen: Nasopharyngeal Swab; Nasopharyngeal(NP) swabs in vial transport medium  Result Value Ref Range Status   SARS Coronavirus 2 by RT PCR NEGATIVE NEGATIVE Final    Comment: (NOTE) SARS-CoV-2 target nucleic acids are NOT DETECTED.  The SARS-CoV-2 RNA is generally detectable in upper respiratory specimens during the acute phase of infection. The lowest concentration of SARS-CoV-2 viral copies this assay can detect is 138 copies/mL. A negative result does not preclude SARS-Cov-2 infection and should not be used as the sole basis for treatment or other patient management decisions. A negative result may occur with  improper specimen collection/handling, submission of specimen other than nasopharyngeal swab, presence of viral mutation(s) within the areas targeted by this assay, and inadequate number of viral copies(<138 copies/mL). A negative result must be combined with clinical observations, patient history, and  epidemiological information. The expected result is Negative.  Fact Sheet for Patients:  EntrepreneurPulse.com.au  Fact Sheet for Healthcare Providers:  IncredibleEmployment.be  This test is no t yet approved or cleared by the [place] FDA and  has been authorized for detection and/or diagnosis of SARS-CoV-2 by FDA under an Emergency Use Authorization (EUA). This EUA will remain  in effect (meaning this test can be used) for the duration of the COVID-19 declaration under Section 564(b)(1) of the Act, 21  U.S.C.section 360bbb-3(b)(1), unless the authorization is terminated  or revoked sooner.       Influenza A by PCR NEGATIVE NEGATIVE Final   Influenza B by PCR NEGATIVE NEGATIVE Final    Comment: (NOTE) The Xpert Xpress SARS-CoV-2/FLU/RSV plus assay is intended as an aid in the diagnosis of influenza from Nasopharyngeal swab specimens and should not be used as a sole basis for treatment. Nasal washings and aspirates are unacceptable for Xpert Xpress SARS-CoV-2/FLU/RSV testing.  Fact Sheet for Patients: EntrepreneurPulse.com.au  Fact Sheet for Healthcare Providers: IncredibleEmployment.be  This test is not yet approved or cleared by the [place] FDA and has been authorized for detection and/or diagnosis of SARS-CoV-2 by FDA under an Emergency Use Authorization (EUA). This EUA will remain in effect (meaning this test can be used) for the duration of the COVID-19 declaration under Section 564(b)(1) of the Act, 21 U.S.C. section 360bbb-3(b)(1), unless the authorization is terminated or revoked.  Performed at [facility], [place] [address], [place], [place] [address]     Studies/Results: DG CHEST PORT 1 VIEW  Result Date: 09/27/2020 CLINICAL DATA:  Preoperative chest x-ray EXAM: PORTABLE CHEST 1 VIEW COMPARISON:  March 14, 2008 FINDINGS: The heart size and mediastinal  contours are within normal limits. Both lungs are clear. The visualized skeletal structures are unremarkable. IMPRESSION: No active disease. Electronically Signed   By: Dorise Bullion III M.D   On: 09/27/2020 21:59   [place] EKG SITE RITE  Result Date: 09/28/2020 If Site Rite image not attached, placement could not be confirmed due to current cardiac rhythm.     Assessment/Plan:  INTERVAL HISTORY: He tolerated ceftriaxone without problems I switched vancomycin to daptomycin but now he is growing Streptococcus pyogenes so he does not need daptomycin   Active Problems:   Septic arthritis of knee ([place])    Kyle Pitts is a 60 y.o. male with GAS septic arthritis sp arthroscopic surgery.  He tolerated ceftriaxone quite well.  1 could consider amoxicillin challenge on Monday  He has PICC line in place and I have put in orders for opat  I spent more than 35 minutes with the patient including greater than 50% of time in face to face counseling of the patient personally reviewing radiographs, along with pertinent laboratory microbiological data review of medical records and in coordination of his care.   Diagnosis: Septic knee  Culture Result: Streptococcus pyogenes  Allergies  Allergen Reactions   Cephalexin Nausea Only    Upset stomach   Penicillins Rash    OPAT Orders Discharge antibiotics:   ceftriaxone    Duration: 6 weeks End Date:  July 22nd, 2022  [facility] Care Per Protocol:   Labs  weekly while on IV antibiotics: __x CBC with differential _x_ BMP w GFR/CMP _x_ CRP _x_ ESR    _x_ Please pull PIC at completion of IV antibiotics __ Please leave PIC in place until doctor has seen patient or been notified  Fax weekly labs to [phone]  Clinic Follow Up Appt:   Kyle Pitts has an appointment on 11/01/2020 with Dr. Tommy Medal at 3 PM  The [facility] is located in the [facility] at  [place] in [place].  [address], which is located to the left of the elevators.  Phone: [phone]  Fax: [phone]  [email or web address]  He should arrive 15-30 minutes prior to his appointment.   LOS: 1 day   Rhina Brackett  Dam 09/29/2020, 2:09 PM

## 2020-09-29 NOTE — Progress Notes (Signed)
Peripherally Inserted Central Catheter Placement  The IV Nurse has discussed with the patient and/or persons authorized to consent for the patient, the purpose of this procedure and the potential benefits and risks involved with this procedure.  The benefits include less needle sticks, lab draws from the catheter, and the patient may be discharged home with the catheter. Risks include, but not limited to, infection, bleeding, blood clot (thrombus formation), and puncture of an artery; nerve damage and irregular heartbeat and possibility to perform a PICC exchange if needed/ordered by physician.  Alternatives to this procedure were also discussed.  Bard Power PICC patient education guide, fact sheet on infection prevention and patient information card has been provided to patient /or left at bedside.    PICC Placement Documentation  PICC Single Lumen 09/29/20 Right Brachial 40 cm 1 cm (Active)  Indication for Insertion or Continuance of Line Home intravenous therapies (PICC only) 09/29/20 0848  Exposed Catheter (cm) 1 cm 09/29/20 0848  Site Assessment Clean;Dry;Intact 09/29/20 0848  Line Status Flushed;Saline locked;Blood return noted 09/29/20 0848  Dressing Type Transparent 09/29/20 0848  Dressing Status Clean;Dry;Intact 09/29/20 0848  Antimicrobial disc in place? Yes 09/29/20 0848  Safety Lock Not Applicable 09/29/20 0848  Line Care Connections checked and tightened 09/29/20 0848  Line Adjustment (NICU/IV Team Only) No 09/29/20 0848  Dressing Intervention New dressing 09/29/20 0848  Dressing Change Due 10/06/20 09/29/20 0848       Elliot Dally 09/29/2020, 8:49 AM

## 2020-09-29 NOTE — Progress Notes (Signed)
Subjective: 2 Days Post-Op Procedure(s) (LRB): ARTHROSCOPY KNEE, WASH OUT (Right) Patient reports pain as mild, far better than what it was.  Objective: Vital signs in last 24 hours: Temp:  [97.9 F (36.6 C)] 97.9 F (36.6 C) (06/12 0517) Pulse Rate:  [90-91] 91 (06/12 0517) Resp:  [15-18] 15 (06/12 0517) BP: (105-130)/(66-94) 120/74 (06/12 0517) SpO2:  [96 %-98 %] 96 % (06/12 0517)  Intake/Output from previous day: 06/11 0701 - 06/12 0700 In: 4568.1 [P.O.:2640; I.V.:1628; IV Piggyback:300.1] Out: 2600 [Urine:2600] Intake/Output this shift: No intake/output data recorded.  Recent Labs    09/27/20 1839 09/28/20 0312  HGB 13.8 12.5*   Recent Labs    09/27/20 1839 09/28/20 0312  WBC 27.0* 16.0*  RBC 3.82* 3.48*  HCT 39.4 36.1*  PLT 141* 95*   Recent Labs    09/27/20 1839 09/28/20 0312  NA 123*  --   K 4.0  --   CL 88*  --   CO2 16*  --   BUN 16  --   CREATININE 1.62* 0.98  GLUCOSE 85  --   CALCIUM 8.9  --    Recent Labs    09/27/20 1839  INR 1.1    Neurovascular intact Incision: dressing C/D/I - his dressing was changed today with mepilex dressings placed over incision/drain sites   Assessment/Plan: 2 Days Post-Op Procedure(s) (LRB): ARTHROSCOPY KNEE, WASH OUT (Right) Up with therapy Continue ABX therapy due to native septic knee Cultures pending - Staph in pairs and chains PIC line today D/C pending culture specific antibiotic selection with ID   Anticipated LOS equal to or greater than 2 midnights due to infection require inpatient treatment and care   Kyle Pitts 09/29/2020, 7:47 AM

## 2020-09-29 NOTE — Plan of Care (Signed)
  Problem: Clinical Measurements: Goal: Diagnostic test results will improve Outcome: Progressing   Problem: Clinical Measurements: Goal: Respiratory complications will improve Outcome: Progressing   Problem: Clinical Measurements: Goal: Cardiovascular complication will be avoided Outcome: Progressing   Problem: Coping: Goal: Level of anxiety will decrease Outcome: Progressing   Problem: Elimination: Goal: Will not experience complications related to bowel motility Outcome: Progressing   

## 2020-09-29 NOTE — Progress Notes (Signed)
Pharmacy Antibiotic Note  Kyle Pitts is a 60 y.o. male admitted on 09/27/2020 with septic joint infection of the knee.  Pharmacy has been consulted for daptomycin dosing.  Patient underwent wash out of knee on 6/11, ID following and managing antibiotics. Transitioning from vancomycin to daptomycin for MRSA coverage.  Today, 09/29/20 WBC elevated but trending down SCr now WNL Sed rate: 23; CRP 27.4 - both elevated Afebrile   Plan: Daptomycin 1000 mg (10 mg/kg) IV q24h Ceftriaxone 2 g IV daily per MD Monitor renal function and culture data   Height: 6' (182.9 cm) Weight: 103.9 kg (229 lb 0.9 oz) IBW/kg (Calculated) : 77.6  Temp (24hrs), Avg:97.9 F (36.6 C), Min:97.9 F (36.6 C), Max:97.9 F (36.6 C)  Recent Labs  Lab 09/27/20 1839 09/28/20 0312  WBC 27.0* 16.0*  CREATININE 1.62* 0.98    Estimated Creatinine Clearance: 102.4 mL/min (by C-G formula based on SCr of 0.98 mg/dL).    Allergies  Allergen Reactions   Cephalexin Nausea Only    Upset stomach   Penicillins Rash    Antimicrobials this admission: vancomycin 6/10 >> 6/11 ceftriaxone 6/11 >>  Daptomycin 6/12 >>  Dose adjustments this admission:  Microbiology results: 6/10 Knee right synovial fluid: abundant GPC in pairs/chains  Thank you for allowing pharmacy to be a part of this patient's care.  Cindi Carbon, PharmD 09/29/2020 10:39 AM

## 2020-09-30 LAB — HIV-1 RNA QUANT-NO REFLEX-BLD
HIV 1 RNA Quant: <20 copies/mL
LOG10 HIV-1 RNA: UNDETERMINED log10copy/mL

## 2020-09-30 LAB — HEPATITIS B SURFACE ANTIBODY, QUANTITATIVE: Hep B S AB Quant (Post): <3.1 m[IU]/mL — ABNORMAL LOW (ref >9.9)

## 2020-09-30 NOTE — Progress Notes (Addendum)
[facility] for Infect[facility] Disease    Date of Admission:  09/27/2020   Total days of antibiotics 4          ID: Kyle Pitts is a 60 y.o. male with  strep pyogenes right knee septic arthritis Active Problems:   Septic arthritis of knee ([facility])    Subjective: Afebrile, still having stiffness to right knee (swelling) no systemic symptoms of fever. Tolerated picc line placement  Medications:   Chlorhexidine Gluconate Cloth  6 each Topical Daily   docusate sodium  100 mg Oral BID   enoxaparin (LOVENOX) injection  40 mg Subcutaneous Q24H   pneumococcal 23 valent vaccine  0.5 mL Intramuscular Tomorrow-1000    Objective: Vital signs in last 24 hours: Temp:  [97.9 F (36.6 C)-98.5 F (36.9 C)] 97.9 F (36.6 C) (06/13 1309) Pulse Rate:  [79-92] 88 (06/13 1309) Resp:  [16-18] 18 (06/13 1309) BP: (136-143)/(89-92) 136/89 (06/13 1309) SpO2:  [96 %-99 %] 98 % (06/13 1309) Physical Exam  Constitutional: He is oriented to person, place, and time. He appears well-developed and well-nourished. No distress.  HENT:  Mouth/Throat: Oropharynx is clear and moist. No oropharyngeal exudate.  Cardiovascular: Normal rate, regular rhythm and normal heart sounds. Exam reveals no gallop and no friction rub.  No murmur heard.  Pulmonary/Chest: Effort normal and breath sounds normal. No respiratory distress. He has no wheezes.  DTO:IZTIW knee swelling, slight warmth to touch Neurological: He is alert and oriented to person, place, and time.  Skin: Skin is warm and dry. No rash noted. No erythema.  Psychiatric: He has a normal mood and affect. His behavior is normal.    Lab Results Recent Labs    09/27/20 1839 09/28/20 0312  WBC 27.0* 16.0*  HGB 13.8 12.5*  HCT 39.4 36.1*  NA 123*  --   K 4.0  --   CL 88*  --   CO2 16*  --   BUN 16  --   CREATININE 1.62* 0.98   Liver Panel Recent Labs    09/27/20 1839  PROT 7.9  ALBUMIN 3.8  AST 107*  ALT 59*  ALKPHOS 89  BILITOT 2.6*    Sedimentation Rate Recent Labs    09/29/20 0339  ESRSEDRATE 23*   C-Reactive Protein Recent Labs    09/27/20 1839 09/29/20 0339  CRP 9.9* 27.4*    Microbiology:  Studies/Results: No results found.   Assessment/Plan: Strep pyogenes native right knee septic arthritis = plan on 4 wk of ceftriaxone 2gm IV daily. Will need home health teaching. Trying to arrange for home health services  Hx of amoxicillin/penicillin allergy = he is will to do test dose challenge which may help in the future, if need to give pcn or amox.  Leukocytosis  = improved  We will see back in the ID clinic for follow up ---------------- Diagnosis: Septic arthritis of right knee  Culture Result: strep pyogenes  Allergies  Allergen Reactions   Cephalexin Nausea Only    Upset stomach   Penicillins Rash    OPAT Orders Discharge antibiotics to be given via PICC line Discharge antibiotics: Per pharmacy protocol ceftriaxone 2gm iv daily  Duration: 4 wk End Date: 7/8  [facility] Care Per Protocol:  Home health RN for IV administration and teaching; PICC line care and labs.    Labs weekly while on IV antibiotics: _x CBC with differential _x_ BMP  _x_ CRP every other week _x_ ESR every other week   _x_ Please pull  PIC at completion of IV antibiotics   Fax weekly labs to [phone]  Clinic Follow Up Appt: 2-4 wk   @ [facility] with dr Lucianne Lei dam   [facility] Infect[facility] Diseases Cell: [phone] Pager: [phone]  09/30/2020, 5:54 PM

## 2020-09-30 NOTE — Progress Notes (Signed)
Physical Therapy Treatment Patient Details Name: Kyle Pitts MRN: [id number] DOB: 1960/07/10 Today's Date: 09/30/2020    History of Present Illness 60 y.o. male who presents to the emergency room with 2-day history of significant knee pain, swelling, and difficulty ambulating and moving the knee.  Aspiration revealed purulent material and so orthopedic consultation was requested.  Patient was brought the operating room for an arthroscopic I&D of a septic right knee.    PT Comments    Pt is progressing toward PT goals. R knee edema decreasing, erythema decr--knee extension improving however continues  with some limitations d/t pain. Will continue to follow in acute setting, could d/c from PT standpoint.   Follow Up Recommendations  Home health PT (vs OPPT)     Equipment Recommendations  Rolling walker with 5" wheels    Recommendations for Other Services       Precautions / Restrictions Precautions Precautions: Fall Restrictions Weight Bearing Restrictions: Yes RLE Weight Bearing: Weight bearing as tolerated    Mobility  Bed Mobility Overal bed mobility: Needs Assistance Bed Mobility: Sit to Supine       Sit to supine: Min assist   General bed mobility comments: assist with RLE    Transfers Overall transfer level: Needs assistance Equipment used: Rolling walker (2 wheeled) Transfers: Sit to/from Stand Sit to Stand: Min guard;Supervision         General transfer comment: cues for hand placement, supervision for safety  Ambulation/Gait Ambulation/Gait assistance: Min guard;Supervision Gait Distance (Feet): 70 Feet Assistive device: Rolling walker (2 wheeled) Gait Pattern/deviations: Step-to pattern;Decreased stance time - right     General Gait Details: cues for sequence and RW position. heavy use of UEs d/t RLE pain with WBing; wt shift to RLE, knee extension and heel contact during stance on RLE improved   Stairs Stairs: Yes Stairs assistance: Min  assist;Min guard Stair Management: No rails;Step to pattern;Backwards;With walker Number of Stairs: 3 General stair comments: cues for technique and sequence   Wheelchair Mobility    Modified Rankin (Stroke Patients Only)       Balance                                            Cognition Arousal/Alertness: Awake/alert Behavior During Therapy: WFL for tasks assessed/performed Overall Cognitive Status: Within Functional Limits for tasks assessed                                        Exercises General Exercises - Lower Extremity Ankle Circles/Pumps: AROM;Both;10 reps Quad Sets: AROM;Right;5 reps Heel Slides: AROM;Right;5 reps (ltd by pain)    General Comments        Pertinent Vitals/Pain Pain Assessment: 0-10 Pain Score: 9  Faces Pain Scale: Hurts whole lot Pain Location: R knee Pain Descriptors / Indicators: Grimacing;Discomfort;Sore    Home Living                      Prior Function            PT Goals (current goals can now be found in the care plan section) Acute Rehab PT Goals Patient Stated Goal: less knee pain PT Goal Formulation: With patient Time For Goal Achievement: 10/05/20 Potential to Achieve Goals: Good Progress towards PT goals: Progressing toward  goals    Frequency    7X/week      PT Plan Current plan remains appropriate    Co-evaluation              AM-PAC PT "6 Clicks" Mobility   Outcome Measure  Help needed turning from your back to your side while in a flat bed without using bedrails?: A Little Help needed moving from lying on your back to sitting on the side of a flat bed without using bedrails?: A Little Help needed moving to and from a bed to a chair (including a wheelchair)?: A Little Help needed standing up from a chair using your arms (e.g., wheelchair or bedside chair)?: A Little Help needed to walk in hospital room?: A Little Help needed climbing 3-5 steps with a  railing? : A Little 6 Click Score: 18    End of Session Equipment Utilized During Treatment: Gait belt Activity Tolerance: Patient tolerated treatment well Patient left: with call bell/phone within reach;in chair;with chair alarm set   PT Visit Diagnosis: Other abnormalities of gait and mobility (R26.89);Difficulty in walking, not elsewhere classified (R26.2)     Time: 4008-6761 PT Time Calculation (min) (ACUTE ONLY): 32 min  Charges:  $Gait Training: 23-37 mins                     Delice Bison, PT  Acute Rehab Dept ([facility]) [phone] Pager [phone]  09/30/2020    [facility] 09/30/2020, 10:21 AM

## 2020-09-30 NOTE — Progress Notes (Signed)
Subjective: 3 Days Post-Op Procedure(s) (LRB): ARTHROSCOPY KNEE, WASH OUT (Right) Patient reports pain as mild.  Tolerating PO without N/V. No complaints. +Knee pain with knee ROM  Objective: Vital signs in last 24 hours: Temp:  [98.2 F (36.8 C)-98.5 F (36.9 C)] 98.5 F (36.9 C) (06/13 0440) Pulse Rate:  [79-92] 79 (06/13 0440) Resp:  [16-18] 16 (06/13 0440) BP: (138-148)/(91-92) 143/92 (06/13 0440) SpO2:  [96 %-99 %] 96 % (06/13 0440)  Intake/Output from previous day: 06/12 0701 - 06/13 0700 In: 2607 [P.O.:2040; I.V.:397.1; IV Piggyback:169.9] Out: 2450 [Urine:2450] Intake/Output this shift: Total I/O In: -  Out: 300 [Urine:300]  Recent Labs    09/27/20 1839 09/28/20 0312  HGB 13.8 12.5*   Recent Labs    09/27/20 1839 09/28/20 0312  WBC 27.0* 16.0*  RBC 3.82* 3.48*  HCT 39.4 36.1*  PLT 141* 95*   Recent Labs    09/27/20 1839 09/28/20 0312  NA 123*  --   K 4.0  --   CL 88*  --   CO2 16*  --   BUN 16  --   CREATININE 1.62* 0.98  GLUCOSE 85  --   CALCIUM 8.9  --    Recent Labs    09/27/20 1839  INR 1.1    Neurologically intact Neurovascular intact Sensation intact distally Intact pulses distally Dorsiflexion/Plantar flexion intact Incision: dressing C/D/I No cellulitis present Compartment soft   Assessment/Plan: 3 Days Post-Op Procedure(s) (LRB): ARTHROSCOPY KNEE, WASH OUT (Right) - Regular Diet - Continue current meds for pain, well controlled - DVT Ppx: currently on lovenox.  - Up with PT. HHPT and rolling walker DME placed.  - Continue abx due to knee infection. Appreciate ID consult. OPAT orders for ceftriaxone place. PICC line placed. HHRN order placed.  D/C when home health needs are set up. Per case manager this is not yet done. Possible D/C tomorrow.   Plan will be for patient to F/U at [facility] in 2 weeks.    Rhodia Albright 09/30/2020, 12:58 PM

## 2020-09-30 NOTE — TOC Transition Note (Signed)
Transition of Care [facility]) - CM/SW Discharge Note   Patient Details  Name: KAIDYN JAVID MRN: [id number] Date of Birth: [birth year]  Transition of Care [facility]) CM/SW Contact:  Amada Jupiter, LCSW Phone Number: 09/30/2020, 4:55 PM   Clinical Narrative:    Have been able to secure [facility] coverage via [facility] will start of care tomorrow.  I have not been able to secure HHPT coverage but will continue to attempt (pt aware I may not be able to get this arranged).  [facility] Jeri Modena) to provide [facility] abx coverage (teaching completed this afternoon).  No further TOC needs.   Final next level of care: Home w Home Health Services Barriers to Discharge: Barriers Resolved   Patient Goals and CMS Choice Patient states their goals for this hospitalization and ongoing recovery are:: return home      Discharge Placement                       Discharge Plan and Services                DME Arranged: Walker rolling DME Agency: [employer] Date DME Agency Contacted: 09/30/20 Time DME Agency Contacted: [phone] Representative spoke with at DME Agency: Mayra Reel HH Arranged: RN HH Agency: Other - See comment Human resources officer) Date HH Agency Contacted: 09/30/20 Time HH Agency Contacted: 1600 Representative spoke with at [facility] Agency: Revonda Standard  Social Determinants of Health (SDOH) Interventions     Readmission Risk Interventions No flowsheet data found.

## 2020-10-01 LAB — PATHOLOGIST SMEAR REVIEW

## 2020-10-01 MED ORDER — HEPARIN SOD (PORK) LOCK FLUSH 100 UNIT/ML IV SOLN
250.0000 [IU] | INTRAVENOUS | Status: AC | PRN
  Administered 2020-10-01: 250 [IU]
  Filled 2020-10-01: qty 2.5

## 2020-10-01 MED ORDER — CELECOXIB 100 MG PO CAPS
100.0000 mg | ORAL_CAPSULE | Freq: Two times a day (BID) | ORAL | Status: DC
  Administered 2020-10-01 – 2020-10-02 (×2): 100 mg via ORAL
  Filled 2020-10-01 (×2): qty 1

## 2020-10-01 NOTE — Progress Notes (Signed)
Physical Therapy Treatment Patient Details Name: Kyle Pitts MRN: [id number] DOB: Jun 29, 1960 Today's Date: 10/01/2020    History of Present Illness 60 y.o. male who presents to the emergency room with 2-day history of significant knee pain, swelling, and difficulty ambulating and moving the knee.  Aspiration revealed purulent material and so orthopedic consultation was requested.  Patient was brought the operating room for an arthroscopic I&D of a septic right knee.    PT Comments    During gait pt with earlier ability to place R heel on floor and improved wt shift to R however seems to have incr pain overall today, limiting activity tol.  Too painful to review exercises. RLE elevated in bed in knee extension as tol. Continue PT   Follow Up Recommendations  Other (comment) (no ins)     Equipment Recommendations  Rolling walker with 5" wheels    Recommendations for Other Services       Precautions / Restrictions Precautions Precautions: Fall Restrictions RLE Weight Bearing: Weight bearing as tolerated    Mobility  Bed Mobility Overal bed mobility: Needs Assistance Bed Mobility: Sit to Supine;Supine to Sit     Supine to sit: Supervision Sit to supine: Supervision   General bed mobility comments: able to use leg lifter to rign RLE on to bed    Transfers Overall transfer level: Needs assistance Equipment used: Rolling walker (2 wheeled) Transfers: Sit to/from Stand Sit to Stand: Min guard;Supervision         General transfer comment: cues for hand placement, supervision for safety  Ambulation/Gait Ambulation/Gait assistance: Min guard;Supervision Gait Distance (Feet): 60 Feet Assistive device: Rolling walker (2 wheeled) Gait Pattern/deviations: Step-to pattern;Decreased stance time - right;Decreased weight shift to right     General Gait Details: cues for sequence and RW position. heavy use of UEs d/t RLE pain with WBing; cues to incr wt shift to RLE as tol,  knee extension and heel contact during stance on RLE improved   Stairs   Stairs assistance: Min assist;Min guard Stair Management: No rails;Step to pattern;Backwards;With walker   General stair comments: cues for technique and sequence   Wheelchair Mobility    Modified Rankin (Stroke Patients Only)       Balance                                            Cognition Arousal/Alertness: Awake/alert Behavior During Therapy: WFL for tasks assessed/performed Overall Cognitive Status: Within Functional Limits for tasks assessed                                        Exercises General Exercises - Lower Extremity Ankle Circles/Pumps: AROM;Both;10 reps Heel Slides:  (ltd by pain)    General Comments        Pertinent Vitals/Pain Pain Assessment: 0-10 Faces Pain Scale: Hurts whole lot Pain Location: R knee and low back Pain Descriptors / Indicators: Grimacing;Discomfort;Sore;Tightness Pain Intervention(s): Limited activity within patient's tolerance;Monitored during session;Repositioned;Patient requesting pain meds-RN notified    Home Living                      Prior Function            PT Goals (current goals can now be found in the care plan  section) Acute Rehab PT Goals Patient Stated Goal: less knee pain PT Goal Formulation: With patient Time For Goal Achievement: 10/05/20 Potential to Achieve Goals: Good Progress towards PT goals: Progressing toward goals    Frequency    7X/week      PT Plan Current plan remains appropriate    Co-evaluation              AM-PAC PT "6 Clicks" Mobility   Outcome Measure  Help needed turning from your back to your side while in a flat bed without using bedrails?: A Little Help needed moving from lying on your back to sitting on the side of a flat bed without using bedrails?: A Little Help needed moving to and from a bed to a chair (including a wheelchair)?: A  Little Help needed standing up from a chair using your arms (e.g., wheelchair or bedside chair)?: A Little Help needed to walk in hospital room?: A Little Help needed climbing 3-5 steps with a railing? : A Little 6 Click Score: 18    End of Session Equipment Utilized During Treatment: Gait belt Activity Tolerance: Patient limited by pain Patient left: with call bell/phone within reach;in bed;with bed alarm set   PT Visit Diagnosis: Other abnormalities of gait and mobility (R26.89);Difficulty in walking, not elsewhere classified (R26.2)     Time: 1017-5102 PT Time Calculation (min) (ACUTE ONLY): 24 min  Charges:  $Gait Training: 23-37 mins                     Delice Bison, PT  Acute Rehab Dept ([facility]) [phone] Pager [phone]  10/01/2020    [facility] 10/01/2020, 4:29 PM

## 2020-10-01 NOTE — Progress Notes (Signed)
I have reviewed and concur with this student's documentation.   

## 2020-10-01 NOTE — Progress Notes (Signed)
    Subjective: 4 Days Post-Op Procedure(s) (LRB): ARTHROSCOPY KNEE, WASH OUT (Right) Patient reports pain as 3 on 0-10 scale.   Denies CP or SOB.  Voiding without difficulty. Positive flatus. Objective: Vital signs in last 24 hours: Temp:  [97.9 F (36.6 C)-98.4 F (36.9 C)] 98.4 F (36.9 C) (06/14 0537) Pulse Rate:  [79-88] 79 (06/14 0537) Resp:  [18-19] 19 (06/14 0537) BP: (136-145)/(70-89) 138/76 (06/14 0537) SpO2:  [97 %-98 %] 97 % (06/14 0537)  Intake/Output from previous day: 06/13 0701 - 06/14 0700 In: 694.9 [P.O.:360; I.V.:234.9; IV Piggyback:100] Out: 1400 [Urine:1400] Intake/Output this shift: No intake/output data recorded.  Labs: No results for input(s): HGB in the last 72 hours. No results for input(s): WBC, RBC, HCT, PLT in the last 72 hours. No results for input(s): NA, K, CL, CO2, BUN, CREATININE, GLUCOSE, CALCIUM in the last 72 hours. No results for input(s): LABPT, INR in the last 72 hours.  Physical Exam: Neurologically intact ABD soft Intact pulses distally Dorsiflexion/Plantar flexion intact Incision: dressing C/D/I Compartment soft Body mass index is 31.07 kg/m. Positive swelling noted and tenderness to palpation.   Assessment/Plan: 4 Days Post-Op Procedure(s) (LRB): ARTHROSCOPY KNEE, WASH OUT (Right) Patient slowly improving HH nursing set up by CM/SW.  Plan on IV abx as arranged by ID service Dr [name] to evaluate patient today - question need for repeat ID given persistent swelkling and pain with PROM. Plan on d/c today if cleared by Dr Charlyne Mom for Dr. Venita Lick [facility] [phone] 10/01/2020, 7:31 AM

## 2020-10-01 NOTE — Progress Notes (Signed)
PHARMACY CONSULT NOTE FOR:  OUTPATIENT  PARENTERAL ANTIBIOTIC THERAPY (OPAT)  Indication: strep pyogenes right knee septic arthritis Regimen: ceftriaxone 2g IV q24h End date: 10/25/2020  IV antibiotic discharge orders are pended. To discharging provider:  please sign these orders via discharge navigator,  Select New Orders & click on the button choice - Manage This Unsigned Work.     Thank you for allowing pharmacy to be a part of this patient's care.  Mickeal Skinner 10/01/2020, 12:47 PM

## 2020-10-01 NOTE — Progress Notes (Signed)
   Subjective:  Patient reports pain as moderate.  Pain significantly improved since surgery.  He states that he is still requiring occasional as needed oral pain medicine.  Denies fevers or chills.  Objective:   VITALS:   Vitals:   09/30/20 1309 09/30/20 2058 10/01/20 0537 10/01/20 1339  BP: 136/89 (!) 145/70 138/76 (!) 149/91  Pulse: 88 81 79 90  Resp: $Remo'18 18 19 18  'CkavT$ Temp: 97.9 F (36.6 C) 98.4 F (36.9 C) 98.4 F (36.9 C) 98.4 F (36.9 C)  TempSrc: Oral Oral Oral   SpO2: 98% 98% 97% 99%  Weight:      Height:        Neurologically intact Neurovascular intact Sensation intact distally Intact pulses distally Compartment soft Some warmth on palpation of the knee joint itself.  Otherwise able to range the knee actively from 5 to about 50 degrees and with passive assistance 0-90.  Distally neurovascularly intact. Effusion present but no overlying erythema at this time.  Portal sites are dry.   Lab Results  Component Value Date   WBC 16.0 (H) 09/28/2020   HGB 12.5 (L) 09/28/2020   HCT 36.1 (L) 09/28/2020   MCV 103.7 (H) 09/28/2020   PLT 95 (L) 09/28/2020   BMET    Component Value Date/Time   NA 123 (L) 09/27/2020 1839   K 4.0 09/27/2020 1839   CL 88 (L) 09/27/2020 1839   CO2 16 (L) 09/27/2020 1839   GLUCOSE 85 09/27/2020 1839   BUN 16 09/27/2020 1839   CREATININE 0.98 09/28/2020 0312   CALCIUM 8.9 09/27/2020 1839   GFRNONAA >60 09/28/2020 0312   GFRAA  05/13/2010 1718    >60        The eGFR has been calculated using the MDRD equation. This calculation has not been validated in all clinical situations. eGFR's persistently <60 mL/min signify possible Chronic Kidney Disease.     Assessment/Plan: 4 Days Post-Op   Active Problems:   Septic arthritis of knee ([place])   Up with therapy -At this time I do believe he is dealing with some postinfectious synovitis and expected effusion.  I do not think that he requires a second washout at this point, mostly due to  the significant improvement in his pain and ability to range the knee as well.  I do believe that he is on a good antibiotic regimen and should continue that.  I would like to see him back about a week from now so that we can make sure that he continues to trend in the right direction.  -Otherwise no procedure planned during this admission from me.   Nicholes Stairs 10/01/2020, 4:01 PM   Geralynn Rile, MD [phone]

## 2020-10-02 LAB — BODY FLUID CULTURE W GRAM STAIN

## 2020-10-02 MED ORDER — ASPIRIN EC 81 MG PO TBEC: 81.0000 mg | DELAYED_RELEASE_TABLET | Freq: Every day | ORAL | 11 refills | Status: DC

## 2020-10-02 MED ORDER — AMOXICILLIN 500 MG PO CAPS
500.0000 mg | ORAL_CAPSULE | Freq: Once | ORAL | Status: AC
  Administered 2020-10-02: 500 mg via ORAL
  Filled 2020-10-02: qty 1

## 2020-10-02 MED ORDER — ACETAMINOPHEN 325 MG PO TABS: 325.0000 mg | ORAL_TABLET | Freq: Four times a day (QID) | ORAL | Status: DC | PRN

## 2020-10-02 MED ORDER — CEFTRIAXONE IV (FOR PTA / DISCHARGE USE ONLY): 2.0000 g | INTRAVENOUS | 0 refills | Status: DC

## 2020-10-02 MED ORDER — METHOCARBAMOL 500 MG PO TABS: 500.0000 mg | ORAL_TABLET | Freq: Three times a day (TID) | ORAL | 0 refills | Status: DC | PRN

## 2020-10-02 MED ORDER — CELECOXIB 100 MG PO CAPS
100.0000 mg | ORAL_CAPSULE | Freq: Two times a day (BID) | ORAL | 0 refills | Status: DC
Start: 2020-10-02 — End: 2020-11-11

## 2020-10-02 MED ORDER — DOCUSATE SODIUM 100 MG PO CAPS: 100.0000 mg | ORAL_CAPSULE | Freq: Every day | ORAL | 0 refills | Status: DC | PRN

## 2020-10-02 MED ORDER — EPINEPHRINE 0.3 MG/0.3ML IJ SOAJ
0.3000 mg | Freq: Once | INTRAMUSCULAR | Status: DC | PRN
  Filled 2020-10-02: qty 0.6

## 2020-10-02 MED ORDER — POLYETHYLENE GLYCOL 3350 17 G PO PACK: 17.0000 g | PACK | Freq: Every day | ORAL | 0 refills | Status: DC | PRN

## 2020-10-02 MED ORDER — HYDROCODONE-ACETAMINOPHEN 5-325 MG PO TABS: 1.0000 | ORAL_TABLET | Freq: Four times a day (QID) | ORAL | 0 refills | Status: AC | PRN

## 2020-10-02 MED ORDER — DIPHENHYDRAMINE HCL 50 MG/ML IJ SOLN: 25.0000 mg | Freq: Once | INTRAMUSCULAR | Status: DC | PRN

## 2020-10-02 MED ORDER — HEPARIN SOD (PORK) LOCK FLUSH 100 UNIT/ML IV SOLN
250.0000 [IU] | INTRAVENOUS | Status: AC | PRN
  Administered 2020-10-02: 250 [IU]
  Filled 2020-10-02: qty 2.5

## 2020-10-02 MED ORDER — ONDANSETRON HCL 4 MG PO TABS: 4.0000 mg | ORAL_TABLET | Freq: Three times a day (TID) | ORAL | 0 refills | Status: AC | PRN

## 2020-10-02 NOTE — Plan of Care (Signed)
Patient discharged home in stable condition 

## 2020-10-02 NOTE — Progress Notes (Signed)
Physical Therapy Treatment Patient Details Name: Kyle Pitts MRN: [id number] DOB: Sep 23, 1960 Today's Date: 10/02/2020    History of Present Illness 60 y.o. male who presents to the emergency room with 2-day history of significant knee pain, swelling, and difficulty ambulating and moving the knee.  Aspiration revealed purulent material and so orthopedic consultation was requested.  Patient was brought the operating room for an arthroscopic I&D of a septic right knee.    PT Comments    POD # 5 Assisted OOB.  General bed mobility comments: able to use leg lifter to rign RLE and increased time.  General transfer comment: cues for hand placement, supervision for safety.  Pt a bit impulsive. General Gait Details: pt c/o BACK pain more than R knee which eased slightly with amb.  VC's on safety with turns.  General stair comments: pt declined to practice stairs today.  Stated he did it yesterday and he felt comfortable not to repeat. Assisted to recliner and applied ICE R knee.  Back pain slight improvement after walking but still having "a lot" pain which he feels is from "laying in that bed". Pt plans to D/C to home today.   Follow Up Recommendations        Equipment Recommendations  Rolling walker with 5" wheels    Recommendations for Other Services       Precautions / Restrictions Precautions Precautions: Fall Precaution Comments: PICC R UE Restrictions Weight Bearing Restrictions: No RLE Weight Bearing: Weight bearing as tolerated    Mobility  Bed Mobility Overal bed mobility: Needs Assistance Bed Mobility: Supine to Sit     Supine to sit: Supervision     General bed mobility comments: able to use leg lifter to rign RLE and increased time    Transfers Overall transfer level: Needs assistance Equipment used: Rolling walker (2 wheeled) Transfers: Sit to/from Stand Sit to Stand: Supervision         General transfer comment: cues for hand placement, supervision for  safety.  Pt a bit impulsive.  Ambulation/Gait Ambulation/Gait assistance: Supervision Gait Distance (Feet): 75 Feet Assistive device: Rolling walker (2 wheeled) Gait Pattern/deviations: Step-to pattern;Decreased stance time - right;Decreased weight shift to right Gait velocity: decreased   General Gait Details: pt c/o BACK pain more than R knee which eased slightly with amb.  VC's on safety with turns   Stairs         General stair comments: pt declined to practice stairs today.  Stated he did it yesterday and he felt comfortable not to repeat.   Wheelchair Mobility    Modified Rankin (Stroke Patients Only)       Balance                                            Cognition Arousal/Alertness: Awake/alert Behavior During Therapy: WFL for tasks assessed/performed Overall Cognitive Status: Within Functional Limits for tasks assessed                                 General Comments: AxO x 3 but very umcomfortable c/o back pain "from bed"      Exercises      General Comments        Pertinent Vitals/Pain Pain Assessment: Faces Faces Pain Scale: Hurts worst Pain Location: R knee Pain Descriptors / Indicators:  Grimacing;Discomfort;Sore;Tightness Pain Intervention(s): Monitored during session;Repositioned;Premedicated before session;Ice applied    Home Living                      Prior Function            PT Goals (current goals can now be found in the care plan section) Progress towards PT goals: Progressing toward goals    Frequency    7X/week      PT Plan Current plan remains appropriate    Co-evaluation              AM-PAC PT "6 Clicks" Mobility   Outcome Measure  Help needed turning from your back to your side while in a flat bed without using bedrails?: A Little Help needed moving from lying on your back to sitting on the side of a flat bed without using bedrails?: A Little Help needed moving to  and from a bed to a chair (including a wheelchair)?: A Little Help needed standing up from a chair using your arms (e.g., wheelchair or bedside chair)?: A Little Help needed to walk in hospital room?: A Little Help needed climbing 3-5 steps with a railing? : A Little 6 Click Score: 18    End of Session   Activity Tolerance: Patient limited by pain Patient left: with call bell/phone within reach;with bed alarm set;in chair Nurse Communication: Mobility status PT Visit Diagnosis: Other abnormalities of gait and mobility (R26.89);Difficulty in walking, not elsewhere classified (R26.2)     Time: 1315-1340 PT Time Calculation (min) (ACUTE ONLY): 25 min  Charges:  $Gait Training: 8-22 mins $Therapeutic Exercise: 8-22 mins                     Felecia Shelling  PTA Acute  Rehabilitation Services Pager      [phone] Office      [phone]

## 2020-10-02 NOTE — Progress Notes (Signed)
Subjective: 5 Days Post-Op Procedure(s) (LRB): ARTHROSCOPY KNEE, WASH OUT (Right) Patient reports pain as moderate.  Improved since surgery. Denies fevers/chills.  Tolerating PO without N/V + flatus, +void +ambulation.   Objective: Vital signs in last 24 hours: Temp:  [98.4 F (36.9 C)-99 F (37.2 C)] 98.9 F (37.2 C) (06/15 0526) Pulse Rate:  [83-90] 85 (06/15 0526) Resp:  [17-18] 17 (06/15 0526) BP: (118-150)/(71-91) 118/71 (06/15 0526) SpO2:  [98 %-99 %] 99 % (06/15 0526)  Intake/Output from previous day: 06/14 0701 - 06/15 0700 In: 607.1 [P.O.:480; I.V.:127.1] Out: 1300 [Urine:1300] Intake/Output this shift: No intake/output data recorded.  No results for input(s): HGB in the last 72 hours. No results for input(s): WBC, RBC, HCT, PLT in the last 72 hours. No results for input(s): NA, K, CL, CO2, BUN, CREATININE, GLUCOSE, CALCIUM in the last 72 hours. No results for input(s): LABPT, INR in the last 72 hours.  Neurologically intact Neurovascular intact Sensation intact distally Intact pulses distally Compartment soft Some warmth on palpation of the knee joint itself.  Otherwise able to range the knee actively from 5 to about 50 degrees and with passive assistance 0-90.  Distally neurovascularly intact. Effusion present but no overlying erythema at this time.  Portal sites are dry.     Assessment/Plan: 5 Days Post-Op Procedure(s) (LRB): ARTHROSCOPY KNEE, WASH OUT (Right) Patient slowly improving HH nursing set up by CM/SW.  Plan on IV abx as arranged by ID service. No repeat wash-out recommended by Dr. [name].  We will plan D/C today. We will stop lovenox and transition patient to ASA for DVT ppx as out patient.   Patient will f/u with Dr. [name] as an out patient in 1 week.    Kyle Pitts 10/02/2020, 8:28 AM

## 2020-10-02 NOTE — Progress Notes (Signed)
Patient-reported Beta-Lactam Allergy Assessment  Specific drug that caused reaction: Amoxicillin Reaction(s) that occurred: Rash  Antibiotics tolerated in the past: Patient reported:  Penicillin Historical data obtained from the EMR:  N/a  Information received from:  Patient   Due to reported unknown reaction, the following questions were asked about the Amoxicillin allergy:  How long after taking the drug did the reaction occur?  Unknown/ cannot recall  How long ago did the reaction occur?  Over 10 years ago  What was done to manage the reaction?  Unknown   Due to the above information, ID team agreed to trial oral amoxicillin challenge. Patient tolerated well without any reactions.  Based on the above interview and challenge, it has been deemed appropriate for patient to be administered Amoxicillin as the risk for cross-reactivity to documented reaction is low. His amoxicillin allergy has been removed.  Thank you,  Margarite Gouge, PharmD PGY2 ID Pharmacy Resident [phone]

## 2020-10-03 NOTE — Discharge Summary (Addendum)
Patient ID: Kyle Pitts MRN: [id number] DOB/AGE: Nov 28, 1960 60 y.o.  Admit date: 09/27/2020 Discharge date: 10/02/2020  Admission Diagnoses:  Active Problems:   Septic arthritis of knee [facility])   Discharge Diagnoses:  Active Problems:   Septic arthritis of knee ([place])  status post Procedure(s): ARTHROSCOPY KNEE, [place] OUT  History reviewed. No pertinent past medical history.  Surgeries: Procedure(s): ARTHROSCOPY KNEE, [place] OUT on 09/27/2020   Consultants: ID, pharmacy  Discharged Condition: Improved  Hospital Course: Kyle Pitts is an 60 y.o. male who was admitted 09/27/2020 for operative treatment of septic arthritis of right knee. Patient failed conservative treatments (please see the history and physical for the specifics) The patient was taken to the operating room on 09/27/2020 and underwent  Procedure(s): ARTHROSCOPY KNEE, [place].    Patient was given perioperative antibiotics:  Anti-infectives (From admission, onward)    Start     Dose/Rate Route Frequency Ordered Stop   10/02/20 1045  amoxicillin (AMOXIL) capsule 500 mg        500 mg Oral  Once 10/02/20 0951 10/02/20 1038   10/02/20 0000  cefTRIAXone (ROCEPHIN) IVPB        2 g Intravenous Every 24 hours 10/02/20 0749 10/26/20 2359   09/29/20 1200  DAPTOmycin (CUBICIN) 1,000 mg in sodium chloride 0.9 % IVPB  Status:  Discontinued        1,000 mg 140 mL/hr over 30 Minutes Intravenous Daily 09/29/20 1038 09/29/20 1443   09/28/20 1100  vancomycin (VANCOREADY) IVPB 1000 mg/200 mL        1,000 mg 200 mL/hr over 60 Minutes Intravenous Every 12 hours 09/28/20 0059 09/28/20 1141   09/28/20 1100  cefTRIAXone (ROCEPHIN) 2 g in sodium chloride 0.9 % 100 mL IVPB  Status:  Discontinued        2 g 200 mL/hr over 30 Minutes Intravenous Every 24 hours 09/28/20 0912 10/02/20 1957   09/27/20 2245  vancomycin (VANCOREADY) IVPB 1500 mg/300 mL        1,500 mg 150 mL/hr over 120 Minutes Intravenous  Once 09/27/20 2241 09/27/20  2244   09/27/20 2237  vancomycin (VANCOCIN) 1-5 GM/200ML-% IVPB       Note to Pharmacy: Margurite Auerbach   : cabinet override      09/27/20 2237 09/27/20 2244        Patient was given lovenox, sequential compression devices, and early ambulation to prevent DVT.   Patient benefited maximally from hospital stay and there were no complications. At the time of discharge, the patient was urinating/moving their bowels without difficulty, tolerating a regular diet, pain is controlled with oral pain medications and they have been cleared by PT/OT. HHRN and OPAT had been set up as well. PICC line placed prior to D/C  Recent vital signs: No data found.   Recent laboratory studies: No results for input(s): WBC, HGB, HCT, PLT, NA, K, CL, CO2, BUN, CREATININE, GLUCOSE, INR, CALCIUM in the last 72 hours.  Invalid input(s): PT, 2   Discharge Medications:   Allergies as of 10/02/2020       Reactions   Cephalexin Nausea Only   Upset stomach        Medication List     TAKE these medications    acetaminophen 325 MG tablet Commonly known as: TYLENOL Take 1-2 tablets (325-650 mg total) by mouth every 6 (six) hours as needed for mild pain (pain score 1-3 or temp > 100.5).   aspirin EC 81 MG tablet Take 1 tablet (  81 mg total) by mouth daily. Swallow whole.   cefTRIAXone  IVPB Commonly known as: ROCEPHIN Inject 2 g into the vein daily for 24 days. Indication:  strep pyogenes right knee septic arthritis First Dose: No Last Day of Therapy:  10/25/2020 Labs - Once weekly:  CBC/D and BMP, Labs - Every other week:  ESR and CRP Method of administration: IV Push Method of administration may be changed at the discretion of home infusion pharmacist based upon assessment of the patient and/or caregiver's ability to self-administer the medication ordered.   celecoxib 100 MG capsule Commonly known as: CELEBREX Take 1 capsule (100 mg total) by mouth 2 (two) times daily.   docusate sodium 100 MG  capsule Commonly known as: COLACE Take 1 capsule (100 mg total) by mouth daily as needed for moderate constipation.   HYDROcodone-acetaminophen 5-325 MG tablet Commonly known as: NORCO/VICODIN Take 1-2 tablets by mouth every 6 (six) hours as needed for up to 5 days for moderate pain or severe pain.   methocarbamol 500 MG tablet Commonly known as: ROBAXIN Take 1 tablet (500 mg total) by mouth every 8 (eight) hours as needed for up to 5 days for muscle spasms.   ondansetron 4 MG tablet Commonly known as: ZOFRAN Take 1 tablet (4 mg total) by mouth every 8 (eight) hours as needed for up to 5 days for nausea or vomiting.   polyethylene glycol 17 g packet Commonly known as: MIRALAX / GLYCOLAX Take 17 g by mouth daily as needed for mild constipation.               Discharge Care Instructions  (From admission, onward)           Start     Ordered   10/02/20 0000  Change dressing on IV access line weekly and PRN  (Home infusion instructions - [facility] )        10/02/20 0749            Diagnostic Studies: DG CHEST PORT 1 VIEW  Result Date: 09/27/2020 CLINICAL DATA:  Preoperative chest x-ray EXAM: PORTABLE CHEST 1 VIEW COMPARISON:  March 14, 2008 FINDINGS: The heart size and mediastinal contours are within normal limits. Both lungs are clear. The visualized skeletal structures are unremarkable. IMPRESSION: No active disease. Electronically Signed   By: Dorise Bullion III M.D   On: 09/27/2020 21:59   DG Knee Complete 4 Views Right  Result Date: 09/27/2020 CLINICAL DATA:  Right knee pain and swelling without recent injury. EXAM: RIGHT KNEE - COMPLETE 4+ VIEW COMPARISON:  None. FINDINGS: No fracture or dislocation is noted. Moderate suprapatellar joint effusion is noted. Moderate narrowing of medial joint space is noted. 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 Moderate suprapatellar joint effusion. No fracture or dislocation is noted.  Electronically Signed   By: Marijo Conception M.D.   On: 09/27/2020 13:02   [place] EKG SITE RITE  Result Date: 09/28/2020 If Site Rite image not attached, placement could not be confirmed due to current cardiac rhythm.   Discharge Instructions     [facility] pharmacist to adjust dose for Vancomycin, Aminoglycosides and other anti-infective therapies as requested by physician.   Complete by: As directed    [facility] to provide Cath Flo 18m   Complete by: As directed    Administer for PICC line occlusion and as ordered by physician for other access device issues.   Anaphylaxis Kit: Provided to treat any anaphylactic reaction  to the medication being provided to the patient if First Dose or when requested by physician   Complete by: As directed    Epinephrine 79m/ml vial / amp: Administer 0.357m(0.8m21msubcutaneously once for moderate to severe anaphylaxis, nurse to call physician and pharmacy when reaction occurs and call 911 if needed for immediate care   Diphenhydramine 82m101m IV vial: Administer 25-82mg55mIM PRN for first dose reaction, rash, itching, mild reaction, nurse to call physician and pharmacy when reaction occurs   Sodium Chloride 0.9% NS 500ml 78mAdminister if needed for hypovolemic blood pressure drop or as ordered by physician after call to physician with anaphylactic reaction   Change dressing on IV access line weekly and PRN   Complete by: As directed    Flush IV access with Sodium Chloride 0.9% and Heparin 10 units/ml or 100 units/ml   Complete by: As directed    Home infusion instructions - [facility]   Complete by: As directed    Instructions: Flush IV access with Sodium Chloride 0.9% and Heparin 10units/ml or 100units/ml   Change dressing on IV access line: Weekly and PRN   Instructions Cath Flo 2mg: A30mnister for PICC Line occlusion and as ordered by physician for other access device   [facility] pharmacist to adjust dose  for: Vancomycin, Aminoglycosides and other anti-infective therapies as requested by physician   Method of administration may be changed at the discretion of home infusion pharmacist based upon assessment of the patient and/or caregiver's ability to self-administer the medication ordered   Complete by: As directed         [facility] up.   Why: to provide home health nursing visits Contact information: [phone] Rogers,Nicholes Stairschedule an appointment as soon as possible for a visit.   Specialty: Orthopedic Surgery Contact information: [phone]          Discharge Plan:  discharge to home with HHRN  [facility]    Signed: Eleno Weimar [facility] [phone], 1:48 PM   Addendum: Discharge date was corrected from 10/03/2020 to 10/02/2020

## 2020-10-07 ENCOUNTER — Ambulatory Visit (HOSPITAL_COMMUNITY)
Admission: RE | Admit: 2020-10-07 | Discharge: 2020-10-07 | Disposition: A | Discharge status: Home / Self Care | Payer: Self-pay | Source: Ambulatory Visit | Attending: Cardiology | Admitting: Cardiology

## 2020-10-07 ENCOUNTER — Emergency Department (HOSPITAL_COMMUNITY): Payer: Self-pay

## 2020-10-07 ENCOUNTER — Other Ambulatory Visit: Payer: Self-pay

## 2020-10-07 ENCOUNTER — Other Ambulatory Visit (HOSPITAL_COMMUNITY): Payer: Self-pay | Admitting: Physician Assistant

## 2020-10-07 ENCOUNTER — Encounter (HOSPITAL_COMMUNITY): Payer: Self-pay

## 2020-10-07 ENCOUNTER — Inpatient Hospital Stay (HOSPITAL_COMMUNITY)
Admission: EM | Admit: 2020-10-07 | Discharge: 2020-10-14 | DRG: 486 | Disposition: A | Discharge status: Home Health | Payer: Self-pay | Attending: Family Medicine | Admitting: Family Medicine

## 2020-10-07 DIAGNOSIS — K7460 Unspecified cirrhosis of liver: Secondary | ICD-10-CM | POA: Insufficient documentation

## 2020-10-07 DIAGNOSIS — Z881 Allergy status to other antibiotic agents status: Secondary | ICD-10-CM

## 2020-10-07 DIAGNOSIS — L03115 Cellulitis of right lower limb: Secondary | ICD-10-CM

## 2020-10-07 DIAGNOSIS — M79604 Pain in right leg: Secondary | ICD-10-CM

## 2020-10-07 DIAGNOSIS — M729 Fibroblastic disorder, unspecified: Secondary | ICD-10-CM | POA: Diagnosis present

## 2020-10-07 DIAGNOSIS — Z20822 Contact with and (suspected) exposure to covid-19: Secondary | ICD-10-CM | POA: Diagnosis present

## 2020-10-07 DIAGNOSIS — M009 Pyogenic arthritis, unspecified: Principal | ICD-10-CM | POA: Diagnosis present

## 2020-10-07 DIAGNOSIS — K7290 Hepatic failure, unspecified without coma: Secondary | ICD-10-CM | POA: Insufficient documentation

## 2020-10-07 DIAGNOSIS — D62 Acute posthemorrhagic anemia: Secondary | ICD-10-CM | POA: Diagnosis not present

## 2020-10-07 DIAGNOSIS — M1711 Unilateral primary osteoarthritis, right knee: Secondary | ICD-10-CM | POA: Diagnosis present

## 2020-10-07 DIAGNOSIS — D72829 Elevated white blood cell count, unspecified: Secondary | ICD-10-CM | POA: Diagnosis present

## 2020-10-07 DIAGNOSIS — Z79899 Other long term (current) drug therapy: Secondary | ICD-10-CM

## 2020-10-07 DIAGNOSIS — E860 Dehydration: Secondary | ICD-10-CM | POA: Diagnosis present

## 2020-10-07 DIAGNOSIS — Z791 Long term (current) use of non-steroidal anti-inflammatories (NSAID): Secondary | ICD-10-CM

## 2020-10-07 DIAGNOSIS — K7469 Other cirrhosis of liver: Secondary | ICD-10-CM | POA: Insufficient documentation

## 2020-10-07 DIAGNOSIS — L02415 Cutaneous abscess of right lower limb: Secondary | ICD-10-CM

## 2020-10-07 DIAGNOSIS — E871 Hypo-osmolality and hyponatremia: Secondary | ICD-10-CM | POA: Diagnosis present

## 2020-10-07 DIAGNOSIS — Z6829 Body mass index (BMI) 29.0-29.9, adult: Secondary | ICD-10-CM

## 2020-10-07 DIAGNOSIS — Z7982 Long term (current) use of aspirin: Secondary | ICD-10-CM

## 2020-10-07 LAB — URINALYSIS, ROUTINE W REFLEX MICROSCOPIC
Bilirubin Urine: NEGATIVE
Glucose, UA: NEGATIVE mg/dL
Hgb urine dipstick: NEGATIVE
Ketones, ur: NEGATIVE mg/dL
Leukocytes,Ua: NEGATIVE
Nitrite: NEGATIVE
Protein, ur: NEGATIVE mg/dL
Specific Gravity, Urine: 1.016 (ref 1.005–1.030)
pH: 5 (ref 5.0–8.0)

## 2020-10-07 LAB — CBC WITH DIFFERENTIAL/PLATELET
Abs Immature Granulocytes: 0.17 10*3/uL — ABNORMAL HIGH (ref 0.00–0.07)
Basophils Absolute: 0 10*3/uL (ref 0.0–0.1)
Basophils Relative: 0 %
Eosinophils Absolute: 0 10*3/uL (ref 0.0–0.5)
Eosinophils Relative: 0 %
HCT: 31 % — ABNORMAL LOW (ref 39.0–52.0)
Hemoglobin: 11 g/dL — ABNORMAL LOW (ref 13.0–17.0)
Immature Granulocytes: 1 %
Lymphocytes Relative: 7 %
Lymphs Abs: 1.1 10*3/uL (ref 0.7–4.0)
MCH: 35.6 pg — ABNORMAL HIGH (ref 26.0–34.0)
MCHC: 35.5 g/dL (ref 30.0–36.0)
MCV: 100.3 fL — ABNORMAL HIGH (ref 80.0–100.0)
Monocytes Absolute: 1.2 10*3/uL — ABNORMAL HIGH (ref 0.1–1.0)
Monocytes Relative: 8 %
Neutro Abs: 13 10*3/uL — ABNORMAL HIGH (ref 1.7–7.7)
Neutrophils Relative %: 84 %
Platelets: 181 10*3/uL (ref 150–400)
RBC: 3.09 MIL/uL — ABNORMAL LOW (ref 4.22–5.81)
RDW: 13.7 % (ref 11.5–15.5)
WBC: 15.6 10*3/uL — ABNORMAL HIGH (ref 4.0–10.5)
nRBC: 0 % (ref 0.0–0.2)

## 2020-10-07 LAB — RESP PANEL BY RT-PCR (FLU A&B, COVID) ARPGX2
Influenza A by PCR: NEGATIVE
Influenza B by PCR: NEGATIVE
SARS Coronavirus 2 by RT PCR: NEGATIVE

## 2020-10-07 LAB — BASIC METABOLIC PANEL
Anion gap: 6 (ref 5–15)
BUN: 25 mg/dL — ABNORMAL HIGH (ref 6–20)
CO2: 28 mmol/L (ref 22–32)
Calcium: 8.5 mg/dL — ABNORMAL LOW (ref 8.9–10.3)
Chloride: 89 mmol/L — ABNORMAL LOW (ref 98–111)
Creatinine, Ser: 0.95 mg/dL (ref 0.61–1.24)
GFR, Estimated: >60 mL/min (ref >60)
Glucose, Bld: 79 mg/dL (ref 70–99)
Potassium: 4.1 mmol/L (ref 3.5–5.1)
Sodium: 123 mmol/L — ABNORMAL LOW (ref 135–145)

## 2020-10-07 LAB — LACTIC ACID, PLASMA: Lactic Acid, Venous: 0.9 mmol/L (ref 0.5–1.9)

## 2020-10-07 MED ORDER — SODIUM CHLORIDE 0.9 % IV SOLN
1.0000 g | Freq: Once | INTRAVENOUS | Status: DC
  Filled 2020-10-07: qty 10

## 2020-10-07 MED ORDER — SODIUM CHLORIDE 0.9 % IV SOLN: 2.0000 g | Freq: Once | INTRAVENOUS | Status: DC

## 2020-10-07 MED ORDER — VANCOMYCIN HCL 1250 MG/250ML IV SOLN
1250.0000 mg | Freq: Two times a day (BID) | INTRAVENOUS | Status: DC
  Administered 2020-10-08 – 2020-10-10 (×5): 1250 mg via INTRAVENOUS
  Filled 2020-10-07 (×5): qty 250

## 2020-10-07 MED ORDER — SODIUM CHLORIDE 0.9 % IV SOLN
1.0000 g | Freq: Once | INTRAVENOUS | Status: DC
  Administered 2020-10-07: 1 g via INTRAVENOUS
  Filled 2020-10-07: qty 10

## 2020-10-07 MED ORDER — SODIUM CHLORIDE 0.9 % IV SOLN
2.0000 g | Freq: Three times a day (TID) | INTRAVENOUS | Status: DC
  Administered 2020-10-08 – 2020-10-11 (×9): 2 g via INTRAVENOUS
  Filled 2020-10-07 (×11): qty 2

## 2020-10-07 MED ORDER — VANCOMYCIN HCL 2000 MG/400ML IV SOLN
2000.0000 mg | Freq: Once | INTRAVENOUS | Status: AC
  Administered 2020-10-07: 2000 mg via INTRAVENOUS
  Filled 2020-10-07: qty 400

## 2020-10-07 MED ORDER — SODIUM CHLORIDE 0.9 % IV BOLUS
1000.0000 mL | Freq: Once | INTRAVENOUS | Status: AC
  Administered 2020-10-07: 1000 mL via INTRAVENOUS

## 2020-10-07 MED ORDER — VANCOMYCIN HCL IN DEXTROSE 1-5 GM/200ML-% IV SOLN: 1000.0000 mg | Freq: Once | INTRAVENOUS | Status: DC

## 2020-10-07 MED ORDER — VANCOMYCIN HCL 1250 MG/250ML IV SOLN
1250.0000 mg | Freq: Two times a day (BID) | INTRAVENOUS | Status: DC
  Filled 2020-10-07: qty 250

## 2020-10-07 MED ORDER — SODIUM CHLORIDE 0.9 % IV SOLN
2.0000 g | Freq: Once | INTRAVENOUS | Status: AC
  Administered 2020-10-07: 2 g via INTRAVENOUS
  Filled 2020-10-07: qty 2

## 2020-10-07 NOTE — ED Notes (Signed)
Spoke with pharmacy, will hang another 1g Rocephin to get a total of 2g of Rocephin

## 2020-10-07 NOTE — H&P (Signed)
History and Physical   Kyle Pitts PNP:[id number] DOB: [birth year] DOA: 10/07/2020  Referring MD/NP/PA: Dr. Tamera Punt  PCP: Ricard Dillon, MD   Outpatient Specialists: Dr. Rolena Infante, Ortho  Patient coming from: Home  Chief Complaint: Right lower extremity swelling and redness  HPI: Kyle Pitts is a 59 y.o. 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  Patient presented to the ER now with worsening swelling of the right lower extremity from the thigh all the way down.  Associated with pain redness and some subjective fever.  Denied any significant pain at the moment.  But has taken his pain medications.  Patient was seen in the ER and evaluated.  He was discharged on the 15th.  On arrival in the ER he appears to have significant cellulitis involving most of the right lower extremity.  Orthopedics consulted.  The joint itself does not appear to be infected.  He has full range of motion.  It is suspected that patient has cellulitis of the right lower extremity now.  He is therefore being admitted to the hospital for evaluation and treatment.  Denied any injury.  No new exposures..  ED Course: Temperature is 98 blood pressure 99/79 pulse 65 respirate of 16 oxygen sat 98% room air.  White count is 14.0 hemoglobin 11.3 and platelets 195.  Sodium 123 potassium 4.3 chloride 89 CO2 of 28 with BUN 25 creatinine 0.85 calcium 8.5.  COVID-19 screen is negative.  Urinalysis showed WBC 11-20 otherwise everything negative.  At this point patient is being admitted with cellulitis of the right lower extremity.  Review of Systems: As per HPI otherwise 10 point review of systems negative.    History reviewed. No pertinent past medical history.  Past Surgical History:  Procedure Laterality Date    KNEE ARTHROSCOPY Right 09/27/2020   Procedure: ARTHROSCOPY KNEE, [place];  Surgeon: Melina Schools, MD;  Location: [facility];  Service: Orthopedics;  Laterality: Right;     reports that he has never smoked. He has never used smokeless tobacco. He reports current alcohol use of about 24.0 standard drinks of alcohol per week. He reports previous drug use. Drugs: Cocaine and Marijuana.  Allergies  Allergen Reactions   Cephalexin Nausea Only    Upset stomach    Family History  Problem Relation Age of Onset   Hepatitis Mother    Cirrhosis Mother    Stroke Father    Heart failure Father      Prior to Admission medications   Medication Sig Start Date End Date Taking? Authorizing Provider  acetaminophen (TYLENOL) 325 MG tablet Take 1-2 tablets (325-650 mg total) by mouth every 6 (six) hours as needed for mild pain (pain score 1-3 or temp > 100.5). 10/02/20   Charlyne Petrin, PA-C  aspirin EC 81 MG tablet Take 1 tablet (81 mg total) by mouth daily. Swallow whole. 10/02/20 10/02/21  Charlyne Petrin, PA-C  cefTRIAXone (ROCEPHIN) IVPB Inject 2 g into the vein daily for 24 days. Indication:  strep pyogenes right knee septic arthritis First Dose: No Last Day of Therapy:  10/25/2020 Labs - Once weekly:  CBC/D and BMP, Labs - Every other week:  ESR and CRP Method of administration: IV Push Method of administration may be changed at the discretion of home infusion  pharmacist based upon assessment of the patient and/or caregiver's ability to self-administer the medication ordered. 10/02/20 10/26/20  Charlyne Petrin, PA-C  celecoxib (CELEBREX) 100 MG capsule Take 1 capsule (100 mg total) by mouth 2 (two) times daily. 10/02/20   Charlyne Petrin, PA-C  docusate sodium (COLACE) 100 MG capsule Take 1 capsule (100 mg total) by mouth daily as needed for moderate constipation. 10/02/20   Charlyne Petrin, PA-C  HYDROcodone-acetaminophen (NORCO/VICODIN) 5-325 MG tablet Take 1-2 tablets by mouth every 6 (six) hours  as needed for up to 5 days for moderate pain or severe pain. 10/02/20 10/07/20  Charlyne Petrin, PA-C  methocarbamol (ROBAXIN) 500 MG tablet Take 1 tablet (500 mg total) by mouth every 8 (eight) hours as needed for up to 5 days for muscle spasms. 10/02/20 10/07/20  Charlyne Petrin, PA-C  ondansetron (ZOFRAN) 4 MG tablet Take 1 tablet (4 mg total) by mouth every 8 (eight) hours as needed for up to 5 days for nausea or vomiting. 10/02/20 10/07/20  Charlyne Petrin, PA-C  polyethylene glycol (MIRALAX / GLYCOLAX) 17 g packet Take 17 g by mouth daily as needed for mild constipation. 10/02/20   Charlyne Petrin, PA-C    Physical Exam: Vitals:   10/07/20 1710 10/07/20 1716 10/07/20 1845 10/07/20 1915  BP: 99/79  140/83 136/84  Pulse: 67  65 70  Resp: _0 Temp: 98 F (36.7 C)     TempSrc: Oral     SpO2: 98%  100% 100%  Weight:  99.8 kg    Height:  6' (1.829 m)        Constitutional: Stable, no distress Vitals:   10/07/20 1710 10/07/20 1716 10/07/20 1845 10/07/20 1915  BP: 99/79  140/83 136/84  Pulse: 67  65 70  Resp: _1 Temp: 98 F (36.7 C)     TempSrc: Oral     SpO2: 98%  100% 100%  Weight:  99.8 kg    Height:  6' (1.829 m)     Eyes: PERRL, lids and conjunctivae normal ENMT: Mucous membranes are dry. Posterior pharynx clear of any exudate or lesions.Normal dentition.  Neck: normal, supple, no masses, no thyromegaly Respiratory: clear to auscultation bilaterally, no wheezing, no crackles. Normal respiratory effort. No accessory muscle use.  Cardiovascular: Regular rate and rhythm, no murmurs / rubs / gallops. No extremity edema. 2+ pedal pulses. No carotid bruits.  Abdomen: no tenderness, no masses palpated. No hepatosplenomegaly. Bowel sounds positive.  Musculoskeletal: no clubbing / cyanosis. No joint deformity upper and lower extremities. Good ROM, no contractures. Normal muscle tone.  Cellulitis of the lower right lower extremity from the thigh all the way to the  foot Skin: Right lower extremity swollen red warm no rashes, lesions, ulcers. No induration Neurologic: CN 2-12 grossly intact. Sensation intact, DTR normal. Strength 5/5 in all 4.  Psychiatric: Normal judgment and insight. Alert and oriented x 3. Normal mood.     Labs on Admission: I have personally reviewed following labs and imaging studies  CBC: Recent Labs  Lab 10/07/20 1838  WBC 15.6*  NEUTROABS 13.0*  HGB 11.0*  HCT 31.0*  MCV 100.3*  PLT 790   Basic Metabolic Panel: Recent Labs  Lab 10/07/20 1838  NA 123*  K 4.1  CL 89*  CO2 28  GLUCOSE 79  BUN 25*  CREATININE 0.95  CALCIUM 8.5*   GFR: Estimated Creatinine Clearance: 103.7 mL/min (by C-G formula based on  SCr of 0.95 mg/dL). Liver Function Tests: No results for input(s): AST, ALT, ALKPHOS, BILITOT, PROT, ALBUMIN in the last 168 hours. No results for input(s): LIPASE, AMYLASE in the last 168 hours. No results for input(s): AMMONIA in the last 168 hours. Coagulation Profile: No results for input(s): INR, PROTIME in the last 168 hours. Cardiac Enzymes: No results for input(s): CKTOTAL, CKMB, CKMBINDEX, TROPONINI in the last 168 hours. BNP (last 3 results) No results for input(s): PROBNP in the last 8760 hours. HbA1C: No results for input(s): HGBA1C in the last 72 hours. CBG: No results for input(s): GLUCAP in the last 168 hours. Lipid Profile: No results for input(s): CHOL, HDL, LDLCALC, TRIG, CHOLHDL, LDLDIRECT in the last 72 hours. Thyroid Function Tests: No results for input(s): TSH, T4TOTAL, FREET4, T3FREE, THYROIDAB in the last 72 hours. Anemia Panel: No results for input(s): VITAMINB12, FOLATE, FERRITIN, TIBC, IRON, RETICCTPCT in the last 72 hours. Urine analysis: No results found for: COLORURINE, APPEARANCEUR, LABSPEC, [place], GLUCOSEU, HGBUR, BILIRUBINUR, KETONESUR, PROTEINUR, UROBILINOGEN, NITRITE, LEUKOCYTESUR Sepsis Labs: _0 (procalcitonin:4,lacticidven:4) ) Recent Results (from the  past 240 hour(s))  Resp Panel by RT-PCR (Flu A&B, Covid) Nasopharyngeal Swab     Status: None   Collection Time: 09/27/20  8:46 PM   Specimen: Nasopharyngeal Swab; Nasopharyngeal(NP) swabs in vial transport medium  Result Value Ref Range Status   SARS Coronavirus 2 by RT PCR NEGATIVE NEGATIVE Final    Comment: (NOTE) SARS-CoV-2 target nucleic acids are NOT DETECTED.  The SARS-CoV-2 RNA is generally detectable in upper respiratory specimens during the acute phase of infection. The lowest concentration of SARS-CoV-2 viral copies this assay can detect is 138 copies/mL. A negative result does not preclude SARS-Cov-2 infection and should not be used as the sole basis for treatment or other patient management decisions. A negative result may occur with  improper specimen collection/handling, submission of specimen other than nasopharyngeal swab, presence of viral mutation(s) within the areas targeted by this assay, and inadequate number of viral copies(<138 copies/mL). A negative result must be combined with clinical observations, patient history, and epidemiological information. The expected result is Negative.  Fact Sheet for Patients:  EntrepreneurPulse.com.au  Fact Sheet for Healthcare Providers:  IncredibleEmployment.be  This test is no t yet approved or cleared by the [place] FDA and  has been authorized for detection and/or diagnosis of SARS-CoV-2 by FDA under an Emergency Use Authorization (EUA). This EUA will remain  in effect (meaning this test can be used) for the duration of the COVID-19 declaration under Section 564(b)(1) of the Act, 21 U.S.C.section 360bbb-3(b)(1), unless the authorization is terminated  or revoked sooner.       Influenza A by PCR NEGATIVE NEGATIVE Final   Influenza B by PCR NEGATIVE NEGATIVE Final    Comment: (NOTE) The Xpert Xpress SARS-CoV-2/FLU/RSV plus assay is intended as an aid in the diagnosis of  influenza from Nasopharyngeal swab specimens and should not be used as a sole basis for treatment. Nasal washings and aspirates are unacceptable for Xpert Xpress SARS-CoV-2/FLU/RSV testing.  Fact Sheet for Patients: EntrepreneurPulse.com.au  Fact Sheet for Healthcare Providers: IncredibleEmployment.be  This test is not yet approved or cleared by the [place] FDA and has been authorized for detection and/or diagnosis of SARS-CoV-2 by FDA under an Emergency Use Authorization (EUA). This EUA will remain in effect (meaning this test can be used) for the duration of the COVID-19 declaration under Section 564(b)(1) of the Act, 21 U.S.C. section 360bbb-3(b)(1), unless the authorization is terminated or revoked.  Performed at [facility], [place] [address], [place], [place] [address]      Radiological Exams on Admission: DG Chest 2 View  Result Date: 10/07/2020 CLINICAL DATA:  Possible sepsis EXAM: CHEST - 2 VIEW COMPARISON:  09/27/2020 FINDINGS: Cardiac shadow is within normal limits. Right PICC line is seen with the tip in the proximal SVC. Lungs are clear. No bony abnormality is noted. IMPRESSION: No acute abnormality noted. Electronically Signed   By: Inez Catalina M.D.   On: 10/07/2020 19:45   VAS [place] LOWER EXTREMITY VENOUS (DVT)  Result Date: 10/07/2020 --  Lower Venous DVT Study Patient Name:  BAYLIN GAMBLIN  Date of Exam:   10/07/2020 Medical Rec #: [id number]        Accession #:    [id number] Date of Birth: [birth year]        Patient Gender: M Patient Age:   70Y Exam Location:  [facility] Procedure:      VAS [place] LOWER EXTREMITY VENOUS (DVT) Referring Phys: [id number] Faythe Casa --------------------------------------------------------------------------------  Indications: Patient had right knee drainage on 09/27/2020. He has noticed increased swelling, pain and erythema since. He is taking antibotics and denies fever, chest pain and  SOB.  Comparison Study: None Performing Technologist: Alecia Mackin RVT, RDCS (AE), RDMS  Examination Guidelines: A complete evaluation includes B-mode imaging, spectral Doppler, color Doppler, and power Doppler as needed of all accessible portions of each vessel. Bilateral testing is considered an integral part of a complete examination. Limited examinations for reoccurring indications may be performed as noted. The reflux portion of the exam is performed with the patient in reverse Trendelenburg.  +---------+---------------+---------+-----------+----------+--------------+ RIGHT    CompressibilityPhasicitySpontaneityPropertiesThrombus Aging +---------+---------------+---------+-----------+----------+--------------+ CFV      Full           Yes      Yes                                 +---------+---------------+---------+-----------+----------+--------------+ SFJ      Full           Yes      Yes                                 +---------+---------------+---------+-----------+----------+--------------+ FV Prox  Full           Yes      Yes                                 +---------+---------------+---------+-----------+----------+--------------+ FV Mid   Full           Yes      Yes                                 +---------+---------------+---------+-----------+----------+--------------+ FV DistalFull           Yes      Yes                                 +---------+---------------+---------+-----------+----------+--------------+ PFV      Full                                                        +---------+---------------+---------+-----------+----------+--------------+  POP      Full           Yes      Yes                                 +---------+---------------+---------+-----------+----------+--------------+ PTV      Full           Yes      Yes                                 +---------+---------------+---------+-----------+----------+--------------+  PERO     Full           Yes      Yes                                 +---------+---------------+---------+-----------+----------+--------------+ Gastroc  Full                                                        +---------+---------------+---------+-----------+----------+--------------+ GSV      Full           Yes      Yes                                 +---------+---------------+---------+-----------+----------+--------------+   +----+---------------+---------+-----------+----------+--------------+ LEFTCompressibilityPhasicitySpontaneityPropertiesThrombus Aging +----+---------------+---------+-----------+----------+--------------+ CFV Full           Yes      Yes                                 +----+---------------+---------+-----------+----------+--------------+    Findings reported to [employer] PA at 4:30.  Summary: RIGHT: - No evidence of deep vein thrombosis in the lower extremity. No indirect evidence of obstruction proximal to the inguinal ligament. - Multiple groin lymph nodes noted in groin witht the largest lymph node measuring 2.9 x .7 x .8 cm. Complex Baker's cyst measures 7.5 x 3.4 x 3.9 cm. Moderate amount of complex suprapatellar fluid. Moderate superficial edema noted in anterior thigh and calf.  LEFT: - No evidence of common femoral vein obstruction.  *See table(s) above for measurements and observations.    Preliminary       Assessment/Plan Principal Problem:   Cellulitis and abscess of right leg Active Problems:   Septic arthritis of knee (HCC)   Hyponatremia   Leucocytosis     #1 cellulitis of the right lower extremity: Patient will be admitted and initiated on IV vancomycin and cefepime for now.  Obtain blood cultures.  Elevate the foot.  Evaluate the foot for other causes.  He did have Doppler ultrasound showing no DVT.  Defer to orthopedics also.  May need to get ID consult if no improvement.  #2 septic arthritis: I will hold on  Rocephin.  Previous culture grew strep pyogenes.  This should be covered by the Vanco and cefepime.  #3 hyponatremia: Probably dehydration.  Not sure if patient also has cirrhosis.  We will monitor sodium as we hydrate with saline.  #4 morbid obesity: Dietary counseling   DVT  prophylaxis: Lovenox Code Status: Full code Family Communication: No family at bedside Disposition Plan: Home Consults called: Orthopedics, Dr. Rolena Infante Admission status: Inpatient  Severity of Illness: The appropriate patient status for this patient is INPATIENT. Inpatient status is judged to be reasonable and necessary in order to provide the required intensity of service to ensure the patient's safety. The patient's presenting symptoms, physical exam findings, and initial radiographic and laboratory data in the context of their chronic comorbidities is felt to place them at high risk for further clinical deterioration. Furthermore, it is not anticipated that the patient will be medically stable for discharge from the hospital within 2 midnights of admission. The following factors support the patient status of inpatient.   " The patient's presenting symptoms include swelling of the right lower extremity. " The worrisome physical exam findings include redness and swelling of the right lower extremity. " The initial radiographic and laboratory data are worrisome because of leukocytosis. " The chronic co-morbidities include septic arthritis.   * I certify that at the point of admission it is my clinical judgment that the patient will require inpatient hospital care spanning beyond 2 midnights from the point of admission due to high intensity of service, high risk for further deterioration and high frequency of surveillance required.Barbette Merino MD [facility] Pager [phone]  If 7PM-7AM, please contact night-coverage [email or web address] Password [email or web address]  10/07/2020, 8:00 PM

## 2020-10-07 NOTE — Progress Notes (Signed)
Pharmacy Antibiotic Note  Kyle Pitts is a 60 y.o. male admitted earlier this month for septic arthritis of the right knee now admitted with cellulitis and abscess of right leg.  Pharmacy has been consulted for vancomycin and cefepime dosing.  Plan: Vancomycin 2000 mg loading dose (1000 + 1000)  followed by 1250 mg IV Q 12 hrs. Goal AUC 400-550. Expected AUC: 448 SCr used: 1   Cefepime 2 g iv q 8 hours  Will f/u renal function, culture results, and clinical course   Height: 6' (182.9 cm) Weight: 99.8 kg (220 lb) IBW/kg (Calculated) : 77.6  Temp (24hrs), Avg:98 F (36.7 C), Min:98 F (36.7 C), Max:98 F (36.7 C)  Recent Labs  Lab 10/07/20 1838 10/07/20 1848  WBC 15.6*  --   CREATININE 0.95  --   LATICACIDVEN  --  0.9    Estimated Creatinine Clearance: 103.7 mL/min (by C-G formula based on SCr of 0.95 mg/dL).    Allergies  Allergen Reactions   Cephalexin Nausea Only    Upset stomach    Antimicrobials this admission: 6/2 CTX x 1 6/20 cefepime >>  6/20 vancomycin >>  Dose adjustments this admission:  Microbiology results: 6/20 BCx:  6/20 COVID/FLu: neg/neg  Thank you for allowing pharmacy to be a part of this patient's care.  Valentina Gu 10/07/2020 8:19 PM

## 2020-10-07 NOTE — ED Provider Notes (Addendum)
[facility] DEPT Provider Note   CSN: [id number] Arrival date & time: 10/07/20  1659     History Chief Complaint  Patient presents with   Leg Swelling    Kyle Pitts is a 60 y.o. male.  Patient is a 60 year old male who presents with right leg pain and swelling.  He was admitted earlier this month for septic arthritis of the right knee.  He went for an OR washout by Dr. Rolena Infante on June 10.  He said his knee was feeling better but over the last 3 to 4 days he has had increased swelling and pain to his right leg.  It started in his knee and spread down to his lower leg and up to his thigh.  He was seen today at [facility].  He had a Doppler ultrasound of his leg which showed no evidence of DVT.  He was sent here for admission.  He denies any fevers.  He is currently on Rocephin daily at home through his PICC line.  He had his dose yesterday but not today.  His cultures from the prior knee infection grew out Streptococcus pyogenes.      History reviewed. No pertinent past medical history.  Patient Active Problem List   Diagnosis Date Noted   Hyponatremia 10/07/2020   Leucocytosis 10/07/2020   Cellulitis and abscess of right leg 10/07/2020   Septic arthritis of knee ([place]) 09/28/2020   ALLERGIC RHINITIS 01/13/2007   ASTHMA 01/13/2007    Past Surgical History:  Procedure Laterality Date   KNEE ARTHROSCOPY Right 09/27/2020   Procedure: ARTHROSCOPY KNEE, [place];  Surgeon: Melina Schools, MD;  Location: [facility];  Service: Orthopedics;  Laterality: Right;       Family History  Problem Relation Age of Onset   Hepatitis Mother    Cirrhosis Mother    Stroke Father    Heart failure Father     Social History   Tobacco Use   Smoking status: Never   Smokeless tobacco: Never  Vaping Use   Vaping Use: Never used  Substance Use Topics   Alcohol use: Yes    Alcohol/week: 24.0 standard drinks    Types: 24 Cans of beer per week    Comment: 3-4  when home from work   Drug use: Not Currently    Types: Cocaine, Marijuana    Comment: last used 1 month ago    Home Medications Prior to Admission medications   Medication Sig Start Date End Date Taking? Authorizing Provider  acetaminophen (TYLENOL) 325 MG tablet Take 1-2 tablets (325-650 mg total) by mouth every 6 (six) hours as needed for mild pain (pain score 1-3 or temp > 100.5). 10/02/20   Charlyne Petrin, PA-C  aspirin EC 81 MG tablet Take 1 tablet (81 mg total) by mouth daily. Swallow whole. 10/02/20 10/02/21  Charlyne Petrin, PA-C  cefTRIAXone (ROCEPHIN) IVPB Inject 2 g into the vein daily for 24 days. Indication:  strep pyogenes right knee septic arthritis First Dose: No Last Day of Therapy:  10/25/2020 Labs - Once weekly:  CBC/D and BMP, Labs - Every other week:  ESR and CRP Method of administration: IV Push Method of administration may be changed at the discretion of home infusion pharmacist based upon assessment of the patient and/or caregiver's ability to self-administer the medication ordered. 10/02/20 10/26/20  Charlyne Petrin, PA-C  celecoxib (CELEBREX) 100 MG capsule Take 1 capsule (100 mg total) by mouth 2 (two) times daily. 10/02/20  Charlyne Petrin, PA-C  docusate sodium (COLACE) 100 MG capsule Take 1 capsule (100 mg total) by mouth daily as needed for moderate constipation. 10/02/20   Charlyne Petrin, PA-C  HYDROcodone-acetaminophen (NORCO/VICODIN) 5-325 MG tablet Take 1-2 tablets by mouth every 6 (six) hours as needed for up to 5 days for moderate pain or severe pain. 10/02/20 10/07/20  Charlyne Petrin, PA-C  methocarbamol (ROBAXIN) 500 MG tablet Take 1 tablet (500 mg total) by mouth every 8 (eight) hours as needed for up to 5 days for muscle spasms. 10/02/20 10/07/20  Charlyne Petrin, PA-C  ondansetron (ZOFRAN) 4 MG tablet Take 1 tablet (4 mg total) by mouth every 8 (eight) hours as needed for up to 5 days for nausea or vomiting. 10/02/20 10/07/20  Charlyne Petrin, PA-C   polyethylene glycol (MIRALAX / GLYCOLAX) 17 g packet Take 17 g by mouth daily as needed for mild constipation. 10/02/20   Charlyne Petrin, PA-C    Allergies    Cephalexin  Review of Systems   Review of Systems  Constitutional:  Negative for chills, diaphoresis, fatigue and fever.  HENT:  Negative for congestion, rhinorrhea and sneezing.   Eyes: Negative.   Respiratory:  Negative for cough, chest tightness and shortness of breath.   Cardiovascular:  Positive for leg swelling. Negative for chest pain.  Gastrointestinal:  Negative for abdominal pain, blood in stool, diarrhea, nausea and vomiting.  Genitourinary:  Negative for difficulty urinating, flank pain, frequency and hematuria.  Musculoskeletal:  Positive for arthralgias and myalgias. Negative for back pain.  Skin:  Positive for wound. Negative for rash.  Neurological:  Negative for dizziness, speech difficulty, weakness, numbness and headaches.   Physical Exam Updated Vital Signs BP 136/84   Pulse 70   Temp 98 F (36.7 C) (Oral)   Resp 16   Ht 6' (1.829 m)   Wt 99.8 kg   SpO2 100%   BMI 29.84 kg/m   Physical Exam Constitutional:      Appearance: He is well-developed.  HENT:     Head: Normocephalic and atraumatic.  Eyes:     Pupils: Pupils are equal, round, and reactive to light.  Cardiovascular:     Rate and Rhythm: Normal rate and regular rhythm.     Heart sounds: Normal heart sounds.  Pulmonary:     Effort: Pulmonary effort is normal. No respiratory distress.     Breath sounds: Normal breath sounds. No wheezing or rales.  Chest:     Chest wall: No tenderness.  Abdominal:     General: Bowel sounds are normal.     Palpations: Abdomen is soft.     Tenderness: There is no abdominal tenderness. There is no guarding or rebound.  Musculoskeletal:        General: Normal range of motion.     Cervical back: Normal range of motion and neck supple.     Comments: Patient has diffuse swelling of the right leg.  He has  swelling from the proximal thigh all the way down through the foot.  His lower leg is very red and erythematous.  He has some redness to his thigh as well.  He does have some generalized swelling to the knee with some generalized tenderness.    Lymphadenopathy:     Cervical: No cervical adenopathy.  Skin:    General: Skin is warm and dry.     Findings: No rash.  Neurological:     Mental Status: He is alert and  oriented to person, place, and time.       ED Results / Procedures / Treatments   Labs (all labs ordered are listed, but only abnormal results are displayed) Labs Reviewed  CBC WITH DIFFERENTIAL/PLATELET - Abnormal; Notable for the following components:      Result Value   WBC 15.6 (*)    RBC 3.09 (*)    Hemoglobin 11.0 (*)    HCT 31.0 (*)    MCV 100.3 (*)    MCH 35.6 (*)    Neutro Abs 13.0 (*)    Monocytes Absolute 1.2 (*)    Abs Immature Granulocytes 0.17 (*)    All other components within normal limits  BASIC METABOLIC PANEL - Abnormal; Notable for the following components:   Sodium 123 (*)    Chloride 89 (*)    BUN 25 (*)    Calcium 8.5 (*)    All other components within normal limits  CULTURE, BLOOD (ROUTINE X 2)  CULTURE, BLOOD (ROUTINE X 2)  RESP PANEL BY RT-PCR (FLU A&B, COVID) ARPGX2  LACTIC ACID, PLASMA  LACTIC ACID, PLASMA  URINALYSIS, ROUTINE W REFLEX MICROSCOPIC    EKG None  Radiology DG Chest 2 View  Result Date: 10/07/2020 CLINICAL DATA:  Possible sepsis EXAM: CHEST - 2 VIEW COMPARISON:  09/27/2020 FINDINGS: Cardiac shadow is within normal limits. Right PICC line is seen with the tip in the proximal SVC. Lungs are clear. No bony abnormality is noted. IMPRESSION: No acute abnormality noted. Electronically Signed   By: Inez Catalina M.D.   On: 10/07/2020 19:45   VAS [place] LOWER EXTREMITY VENOUS (DVT)  Result Date: 10/07/2020 --  Lower Venous DVT Study Patient Name:  Kyle Pitts  Date of Exam:   10/07/2020 Medical Rec #: [id number]        Accession  #:    [id number] Date of Birth: [birth year]        Patient Gender: M Patient Age:   63Y Exam Location:  [facility] Procedure:      VAS [place] LOWER EXTREMITY VENOUS (DVT) Referring Phys: [id number] Faythe Casa --------------------------------------------------------------------------------  Indications: Patient had right knee drainage on 09/27/2020. He has noticed increased swelling, pain and erythema since. He is taking antibotics and denies fever, chest pain and SOB.  Comparison Study: None Performing Technologist: Alecia Mackin RVT, RDCS (AE), RDMS  Examination Guidelines: A complete evaluation includes B-mode imaging, spectral Doppler, color Doppler, and power Doppler as needed of all accessible portions of each vessel. Bilateral testing is considered an integral part of a complete examination. Limited examinations for reoccurring indications may be performed as noted. The reflux portion of the exam is performed with the patient in reverse Trendelenburg.  +---------+---------------+---------+-----------+----------+--------------+ RIGHT    CompressibilityPhasicitySpontaneityPropertiesThrombus Aging +---------+---------------+---------+-----------+----------+--------------+ CFV      Full           Yes      Yes                                 +---------+---------------+---------+-----------+----------+--------------+ SFJ      Full           Yes      Yes                                 +---------+---------------+---------+-----------+----------+--------------+ FV Prox  Full           Yes  Yes                                 +---------+---------------+---------+-----------+----------+--------------+ FV Mid   Full           Yes      Yes                                 +---------+---------------+---------+-----------+----------+--------------+ FV DistalFull           Yes      Yes                                  +---------+---------------+---------+-----------+----------+--------------+ PFV      Full                                                        +---------+---------------+---------+-----------+----------+--------------+ POP      Full           Yes      Yes                                 +---------+---------------+---------+-----------+----------+--------------+ PTV      Full           Yes      Yes                                 +---------+---------------+---------+-----------+----------+--------------+ PERO     Full           Yes      Yes                                 +---------+---------------+---------+-----------+----------+--------------+ Gastroc  Full                                                        +---------+---------------+---------+-----------+----------+--------------+ GSV      Full           Yes      Yes                                 +---------+---------------+---------+-----------+----------+--------------+   +----+---------------+---------+-----------+----------+--------------+ LEFTCompressibilityPhasicitySpontaneityPropertiesThrombus Aging +----+---------------+---------+-----------+----------+--------------+ CFV Full           Yes      Yes                                 +----+---------------+---------+-----------+----------+--------------+    Findings reported to [employer] PA at 4:30.  Summary: RIGHT: - No evidence of deep vein thrombosis in the lower extremity. No indirect evidence of obstruction proximal to the inguinal ligament. - Multiple groin lymph nodes noted in groin witht the largest lymph node measuring 2.9 x .  7 x .8 cm. Complex Baker's cyst measures 7.5 x 3.4 x 3.9 cm. Moderate amount of complex suprapatellar fluid. Moderate superficial edema noted in anterior thigh and calf.  LEFT: - No evidence of common femoral vein obstruction.  *See table(s) above for measurements and observations.    Preliminary      Procedures Procedures   Medications Ordered in ED Medications  vancomycin (VANCOCIN) IVPB 1000 mg/200 mL premix (has no administration in time range)  cefTRIAXone (ROCEPHIN) 2 g in sodium chloride 0.9 % 100 mL IVPB (has no administration in time range)  sodium chloride 0.9 % bolus 1,000 mL (1,000 mLs Intravenous New Bag/Given 10/07/20 1947)    ED Course  I have reviewed the triage vital signs and the nursing notes.  Pertinent labs & imaging results that were available during my care of the patient were reviewed by me and considered in my medical decision making (see chart for details).    MDM Rules/Calculators/A&P                          Patient is a 60 year old male who presents with right leg swelling and erythema after recent treatment for septic arthritis of the knee.  He does have pretty good range of motion of the knee to make septic arthritis less likely.  It appears to be a diffuse cellulitis of the leg.  Ultrasound was negative for DVT.  His white count is elevated.  He is afebrile.  His lactate is normal.  He was started on IV Rocephin and vancomycin.  Blood cultures were obtained.  I spoke with Dr. Veverly Fells with orthopedics who has seen the patient.  He request a hospitalist admission.  I spoke with Dr. Jonelle Sidle who admit the patient for further treatment. Final Clinical Impression(s) / ED Diagnoses Final diagnoses:  Cellulitis of right lower extremity    Rx / DC Orders ED Discharge Orders     None        Malvin Johns, MD 10/07/20 Elesa Massed, MD 10/07/20 2002

## 2020-10-07 NOTE — ED Triage Notes (Signed)
Patient c/o increased swelling to the right knee and leg. Patient states he also  has increased redness to the right leg. Patient pas a right upper arm PICC line and receiving IV antibiotics.

## 2020-10-07 NOTE — ED Provider Notes (Signed)
Emergency Medicine Provider Triage Evaluation Note  Kyle Pitts , a 60 y.o. male  was evaluated in triage.  Pt complains of swelling and erythema to right lower leg.  Patient was admitted to hospital for septic arthritis of right knee, patient required arthroscopy with knee washout.  Patient was discharged on 6/15.  Patient reports that since his discharge she has had worsening swelling and erythema to right lower extremity.  Patient had venous ultrasound study of right leg today which showed no evidence of DVT.  He denies any fevers, chills,, vomiting, abdominal pain.  Review of Systems  Positive: Swelling to right lower extremity, erythema to right lower extremity Negative: Fever, chills, nausea, vomiting, knee pain  Physical Exam  BP 99/79 (BP Location: Left Arm)   Pulse 67   Temp 98 F (36.7 C) (Oral)   Resp 16   Ht 6' (1.829 m)   Wt 99.8 kg   SpO2 98%   BMI 29.84 kg/m  Gen:   Awake, no distress   Resp:  Normal effort  MSK:   Moves extremities without difficulty, +4 edema to right lower extremity, erythema throughout right lower extremity extending up to his thigh sparing the right knee.   Other:    Medical Decision Making  Medically screening exam initiated at 5:33 PM.  Appropriate orders placed.  [name] was informed that the remainder of the evaluation will be completed by another provider, this initial triage assessment does not replace that evaluation, and the importance of remaining in the ED until their evaluation is complete.  The patient appears stable so that the remainder of the work up may be completed by another provider.      Berneice Heinrich 10/07/20 1735    Lorre Nick, MD 10/10/20 [phone]

## 2020-10-07 NOTE — Consult Note (Signed)
Kyle Pitts is an 60 y.o. male.    Chief Complaint: right leg erythema and swelling  HPI: 60 y/o male with recent infection to right knee and s/p irrigation and debridement 10 days ago with Dr. Rolena Infante c/o worsening erythema and swelling to right leg. Pt had negative doppler scan and sent to the emergency department for admission by hospitalist and continue IV antibiotics for cellulitis to right lower leg. Pt denies any pain to the leg. Currently using rocephin without any improvement. Denies any recent falls or injuries. Denies any fevers or chills. Otherwise doing fairly well  PCP:  Ricard Dillon, MD  PMH: History reviewed. No pertinent past medical history.  PSes:  Allergies  Allergen Reactions   Cephalexin Nausea Only    Upset stomach    Medications: Current Facility-Administered Medications  Medication Dose Route Frequency Provider Last Rate Last Admin   cefTRIAXone (ROCEPHIN) 1 g in sodium chloride 0.9 % 100 mL IVPB  1 g Intravenous Once Malvin Johns, MD       vancomycin (VANCOCIN) IVPB 1000 mg/200 mL premix  1,000 mg Intravenous Once Malvin Johns, MD       Current Outpatient Medications  Medication Sig Dispense Refill   acetaminophen (TYLENOL) 325 MG tablet Take 1-2 tablets (325-650 mg total) by mouth every 6 (six) hours as needed for mild pain (pain score 1-3 or temp > 100.5).     aspirin EC 81 MG tablet Take 1 tablet (81 mg total) by mouth daily. Swallow whole. 30 tablet 11   cefTRIAXone (ROCEPHIN) IVPB Inject 2 g into the vein daily for 24 days. Indication:  strep pyogenes right knee septic arthritis First Dose: No Last Day of Therapy:  10/25/2020 Labs - Once weekly:  CBC/D and BMP, Labs - Every other week:  ESR and CRP Method of administration: IV Push Method of administration may be changed at the discretion of home infusion pharmacist based upon assessment of the patient and/or caregiver's ability to self-administer the medication ordered. 24 Units 0    celecoxib (CELEBREX) 100 MG capsule Take 1 capsule (100 mg total) by mouth 2 (two) times daily. 30 capsule 0   docusate sodium (COLACE) 100 MG capsule Take 1 capsule (100 mg total) by mouth daily as needed for moderate constipation. 10 capsule 0   HYDROcodone-acetaminophen (NORCO/VICODIN) 5-325 MG tablet Take 1-2 tablets by mouth every 6 (six) hours as needed for up to 5 days for moderate pain or severe pain. 30 tablet 0   methocarbamol (ROBAXIN) 500 MG tablet Take 1 tablet (500 mg total) by mouth every 8 (eight) hours as needed for up to 5 days for muscle spasms. 15 tablet 0   ondansetron (ZOFRAN) 4 MG tablet Take 1 tablet (4 mg total) by mouth every 8 (eight) hours as needed for up to 5 days for nausea or vomiting. 15 tablet 0   polyethylene glycol (MIRALAX / GLYCOLAX) 17 g packet Take 17 g by mouth daily as needed for mild constipation. 14 each 0    Results for orders placed or performed during the hospital encounter of 10/07/20 (from the past 48 hour(s))  CBC with Differential     Status: Abnormal   Collection Time: 10/07/20  6:38 PM  Result Value Ref Range   WBC 15.6 (H) 4.0 - 10.5 K/uL   RBC 3.09 (L) 4.22 - 5.81 MIL/uL   Hemoglobin 11.0 (L) 13.0 - 17.0 g/dL   HCT 31.0 (L) 39.0 - 52.0 %   MCV 100.3 (H)  80.0 - 100.0 fL   MCH 35.6 (H) 26.0 - 34.0 pg   MCHC 35.5 30.0 - 36.0 g/dL   RDW 13.7 11.5 - 15.5 %   Platelets 181 150 - 400 K/uL   nRBC 0.0 0.0 - 0.2 %   Neutrophils Relative % 84 %   Neutro Abs 13.0 (H) 1.7 - 7.7 K/uL   Lymphocytes Relative 7 %   Lymphs Abs 1.1 0.7 - 4.0 K/uL   Monocytes Relative 8 %   Monocytes Absolute 1.2 (H) 0.1 - 1.0 K/uL   Eosinophils Relative 0 %   Eosinophils Absolute 0.0 0.0 - 0.5 K/uL   Basophils Relative 0 %   Basophils Absolute 0.0 0.0 - 0.1 K/uL   Immature Granulocytes 1 %   Abs Immature Granulocytes 0.17 (H) 0.00 - 0.07 K/uL    Comment: Performed at [facility], [place] [address], [place], [place] [address]   VAS [place] LOWER  EXTREMITY VENOUS (DVT)  Result Date: 10/07/2020 --  Lower Venous DVT Study Patient Name:  ARIANNA Pitts  Date of Exam:   10/07/2020 Medical Rec #: [id number]        Accession #:    [id number] Date of Birth: [birth year]        Patient Gender: M Patient Age:   9Y Exam Location:  [facility] Procedure:      VAS [place] LOWER EXTREMITY VENOUS (DVT) Referring Phys: [id number] Faythe Casa --------------------------------------------------------------------------------  Indications: Patient had right knee drainage on 09/27/2020. He has noticed increased swelling, pain and erythema since. He is taking antibotics and denies fever, chest pain and SOB.  Comparison Study: None Performing Technologist: Alecia Mackin RVT, RDCS (AE), RDMS  Examination Guidelines: A complete evaluation includes B-mode imaging, spectral Doppler, color Doppler, and power Doppler as needed of all accessible portions of each vessel. Bilateral testing is considered an integral part of a complete examination. Limited examinations for reoccurring indications may be performed as noted. The reflux portion of the exam is performed with the patient in reverse Trendelenburg.  +---------+---------------+---------+-----------+----------+--------------+ RIGHT    CompressibilityPhasicitySpontaneityPropertiesThrombus Aging +---------+---------------+---------+-----------+----------+--------------+ CFV      Full           Yes      Yes                                 +---------+---------------+---------+-----------+----------+--------------+ SFJ      Full           Yes      Yes                                 +---------+---------------+---------+-----------+----------+--------------+ FV Prox  Full           Yes      Yes                                 +---------+---------------+---------+-----------+----------+--------------+ FV Mid   Full           Yes      Yes                                  +---------+---------------+---------+-----------+----------+--------------+ FV DistalFull           Yes  Yes                                 +---------+---------------+---------+-----------+----------+--------------+ PFV      Full                                                        +---------+---------------+---------+-----------+----------+--------------+ POP      Full           Yes      Yes                                 +---------+---------------+---------+-----------+----------+--------------+ PTV      Full           Yes      Yes                                 +---------+---------------+---------+-----------+----------+--------------+ PERO     Full           Yes      Yes                                 +---------+---------------+---------+-----------+----------+--------------+ Gastroc  Full                                                        +---------+---------------+---------+-----------+----------+--------------+ GSV      Full           Yes      Yes                                 +---------+---------------+---------+-----------+----------+--------------+   +----+---------------+---------+-----------+----------+--------------+ LEFTCompressibilityPhasicitySpontaneityPropertiesThrombus Aging +----+---------------+---------+-----------+----------+--------------+ CFV Full           Yes      Yes                                 +----+---------------+---------+-----------+----------+--------------+    Findings reported to [employer] PA at 4:30.  Summary: RIGHT: - No evidence of deep vein thrombosis in the lower extremity. No indirect evidence of obstruction proximal to the inguinal ligament. - Multiple groin lymph nodes noted in groin witht the largest lymph node measuring 2.9 x .7 x .8 cm. Complex Baker's cyst measures 7.5 x 3.4 x 3.9 cm. Moderate amount of complex suprapatellar fluid. Moderate superficial edema noted in anterior thigh  and calf.  LEFT: - No evidence of common femoral vein obstruction.  *See table(s) above for measurements and observations.    Preliminary     ROS: ROS Recent I&D right knee 10 days ago Currently on IV rocephin Denies any pain  Physical Exam: Alert and appropriate 60 y/o male in no acute distress No signs of rash anywhere on his body other than right lower leg Moderate to severe erythema extending from posterior thigh, circumfrentially around  his calf into right foot with moderate edema Right knee motion with no pain with flexion or extension No signs of septic joint on exam Nv intact distally No signs of draining wounds Overall decent rom with all of the edema/erythema present  Assessment/Plan Assessment: right leg cellulitis  Plan: Discussed case with both Dr. Veverly Fells and Dr. Stann Mainland and in agreement with admission but hospital medical team to continue IV antibiotic regimen Currently there is no need for any surgical intervention Will continue to monitor his progress thru his admission Patient may eat   Kathrynn Speed, [place] is now [facility]  [place] [address], [address], [place], [place] [address] Phone: [phone] [email or web address] Facebook  [employer]

## 2020-10-08 ENCOUNTER — Inpatient Hospital Stay (HOSPITAL_COMMUNITY): Payer: Self-pay

## 2020-10-08 LAB — LACTIC ACID, PLASMA: Lactic Acid, Venous: 1 mmol/L (ref 0.5–1.9)

## 2020-10-08 LAB — CBC
HCT: 32.3 % — ABNORMAL LOW (ref 39.0–52.0)
HCT: 31.6 % — ABNORMAL LOW (ref 39.0–52.0)
Hemoglobin: 11.3 g/dL — ABNORMAL LOW (ref 13.0–17.0)
Hemoglobin: 11.3 g/dL — ABNORMAL LOW (ref 13.0–17.0)
MCH: 35.3 pg — ABNORMAL HIGH (ref 26.0–34.0)
MCH: 35.8 pg — ABNORMAL HIGH (ref 26.0–34.0)
MCHC: 35 g/dL (ref 30.0–36.0)
MCHC: 35.8 g/dL (ref 30.0–36.0)
MCV: 100.9 fL — ABNORMAL HIGH (ref 80.0–100.0)
MCV: 100 fL (ref 80.0–100.0)
Platelets: 195 10*3/uL (ref 150–400)
Platelets: 206 10*3/uL (ref 150–400)
RBC: 3.2 MIL/uL — ABNORMAL LOW (ref 4.22–5.81)
RBC: 3.16 MIL/uL — ABNORMAL LOW (ref 4.22–5.81)
RDW: 13.7 % (ref 11.5–15.5)
RDW: 13.8 % (ref 11.5–15.5)
WBC: 14 10*3/uL — ABNORMAL HIGH (ref 4.0–10.5)
WBC: 17.5 10*3/uL — ABNORMAL HIGH (ref 4.0–10.5)
nRBC: 0 % (ref 0.0–0.2)
nRBC: 0 % (ref 0.0–0.2)

## 2020-10-08 LAB — COMPREHENSIVE METABOLIC PANEL
ALT: 43 U/L (ref 0–44)
AST: 59 U/L — ABNORMAL HIGH (ref 15–41)
Albumin: 2.2 g/dL — ABNORMAL LOW (ref 3.5–5.0)
Alkaline Phosphatase: 341 U/L — ABNORMAL HIGH (ref 38–126)
Anion gap: 5 (ref 5–15)
BUN: 16 mg/dL (ref 6–20)
CO2: 28 mmol/L (ref 22–32)
Calcium: 8.8 mg/dL — ABNORMAL LOW (ref 8.9–10.3)
Chloride: 95 mmol/L — ABNORMAL LOW (ref 98–111)
Creatinine, Ser: 0.76 mg/dL (ref 0.61–1.24)
GFR, Estimated: >60 mL/min (ref >60)
Glucose, Bld: 86 mg/dL (ref 70–99)
Potassium: 4.3 mmol/L (ref 3.5–5.1)
Sodium: 128 mmol/L — ABNORMAL LOW (ref 135–145)
Total Bilirubin: 4.4 mg/dL — ABNORMAL HIGH (ref 0.3–1.2)
Total Protein: 7.1 g/dL (ref 6.5–8.1)

## 2020-10-08 LAB — CREATININE, SERUM
Creatinine, Ser: 0.85 mg/dL (ref 0.61–1.24)
GFR, Estimated: >60 mL/min (ref >60)

## 2020-10-08 MED ORDER — METHOCARBAMOL 500 MG PO TABS
500.0000 mg | ORAL_TABLET | Freq: Three times a day (TID) | ORAL | Status: DC | PRN
  Administered 2020-10-08 – 2020-10-14 (×15): 500 mg via ORAL
  Filled 2020-10-08 (×15): qty 1

## 2020-10-08 MED ORDER — ENOXAPARIN SODIUM 40 MG/0.4ML IJ SOSY
40.0000 mg | PREFILLED_SYRINGE | INTRAMUSCULAR | Status: DC
  Administered 2020-10-08: 40 mg via SUBCUTANEOUS
  Filled 2020-10-08: qty 0.4

## 2020-10-08 MED ORDER — SODIUM CHLORIDE 0.9 % IV SOLN: INTRAVENOUS | Status: DC

## 2020-10-08 MED ORDER — ALBUTEROL SULFATE HFA 108 (90 BASE) MCG/ACT IN AERS: 2.0000 | INHALATION_SPRAY | Freq: Four times a day (QID) | RESPIRATORY_TRACT | Status: DC | PRN

## 2020-10-08 MED ORDER — HYDROCODONE-ACETAMINOPHEN 5-325 MG PO TABS
1.0000 | ORAL_TABLET | Freq: Four times a day (QID) | ORAL | Status: DC | PRN
  Administered 2020-10-08 – 2020-10-14 (×18): 2 via ORAL
  Filled 2020-10-08 (×19): qty 2

## 2020-10-08 MED ORDER — CELECOXIB 100 MG PO CAPS
100.0000 mg | ORAL_CAPSULE | Freq: Two times a day (BID) | ORAL | Status: DC
  Administered 2020-10-08 – 2020-10-14 (×11): 100 mg via ORAL
  Filled 2020-10-08 (×13): qty 1

## 2020-10-08 MED ORDER — CHLORHEXIDINE GLUCONATE CLOTH 2 % EX PADS
6.0000 | MEDICATED_PAD | Freq: Every day | CUTANEOUS | Status: DC
  Administered 2020-10-08 – 2020-10-14 (×6): 6 via TOPICAL

## 2020-10-08 MED ORDER — MORPHINE SULFATE (PF) 2 MG/ML IV SOLN
2.0000 mg | INTRAVENOUS | Status: DC | PRN
  Administered 2020-10-08 – 2020-10-10 (×9): 2 mg via INTRAVENOUS
  Filled 2020-10-08 (×9): qty 1

## 2020-10-08 MED ORDER — ONDANSETRON HCL 4 MG/2ML IJ SOLN: 4.0000 mg | Freq: Four times a day (QID) | INTRAMUSCULAR | Status: DC | PRN

## 2020-10-08 MED ORDER — ONDANSETRON HCL 4 MG PO TABS: 4.0000 mg | ORAL_TABLET | Freq: Four times a day (QID) | ORAL | Status: DC | PRN

## 2020-10-08 MED ORDER — SODIUM CHLORIDE 0.9% FLUSH
10.0000 mL | Freq: Two times a day (BID) | INTRAVENOUS | Status: DC
  Administered 2020-10-11: 10 mL

## 2020-10-08 MED ORDER — ACETAMINOPHEN 325 MG PO TABS
325.0000 mg | ORAL_TABLET | Freq: Four times a day (QID) | ORAL | Status: DC | PRN
  Administered 2020-10-13 – 2020-10-14 (×2): 650 mg via ORAL
  Filled 2020-10-08 (×2): qty 2

## 2020-10-08 MED ORDER — SODIUM CHLORIDE 0.9% FLUSH
10.0000 mL | INTRAVENOUS | Status: DC | PRN
  Administered 2020-10-09: 10 mL

## 2020-10-08 NOTE — ED Notes (Signed)
Patient gone to MRI unable to update vitals.

## 2020-10-08 NOTE — ED Notes (Signed)
Patient transported to MRI 

## 2020-10-08 NOTE — ED Notes (Signed)
Patient was given his lunch tray.

## 2020-10-08 NOTE — Progress Notes (Signed)
Orthopedics Progress Note  Subjective: Patient still reporting right leg pain and swelling. No improvement yet on IV abx.   Objective:  Vitals:   10/08/20 0924 10/08/20 1106  BP: (!) 151/121 (!) 167/92  Pulse: 86 93  Resp: 18 18  Temp:    SpO2: 97% 99%    General: Awake and alert  Musculoskeletal: Right leg with diffuse erythema consistent with cellulitis from the ankle to the upper thigh.  Knee portals healed with diffuse swelling and some tenderness and pain with ROM although his exam is tough as moving the leg at all hurts and there is some guarding. Compartments are swollen but not tense Neurovascularly intact  Lab Results  Component Value Date   WBC 17.5 (H) 10/08/2020   HGB 11.3 (L) 10/08/2020   HCT 31.6 (L) 10/08/2020   MCV 100.0 10/08/2020   PLT 206 10/08/2020       Component Value Date/Time   NA 128 (L) 10/08/2020 0539   K 4.3 10/08/2020 0539   CL 95 (L) 10/08/2020 0539   CO2 28 10/08/2020 0539   GLUCOSE 86 10/08/2020 0539   BUN 16 10/08/2020 0539   CREATININE 0.76 10/08/2020 0539   CALCIUM 8.8 (L) 10/08/2020 0539   GFRNONAA >60 10/08/2020 0539   GFRAA  05/13/2010 1718    >60        The eGFR has been calculated using the MDRD equation. This calculation has not been validated in all clinical situations. eGFR's persistently <60 mL/min signify possible Chronic Kidney Disease.    Lab Results  Component Value Date   INR 1.1 09/27/2020    Assessment/Plan: S/p arthroscopic I+D for septic right knee with current cellulitis and elevated WBC. Discussed with Dr Stann Mainland and are thinking of formal ID consult to assist with Abx coverage as current regimen not working.  Also may need follow up imaging such as MRI of the whole leg to rule out abscess.    Doran Heater. Veverly Fells, MD 10/08/2020 12:17 PM

## 2020-10-08 NOTE — ED Notes (Signed)
Patient was given a blanket to put underneath his knee and and ice pack to put on top.

## 2020-10-08 NOTE — Progress Notes (Signed)
None PROGRESS NOTE    Kyle Pitts  KTG:[id number] DOB: 09-Oct-1960 DOA: 10/07/2020 PCP: [name], MD   Brief Narrative:  HPI: Kyle Pitts is a 60 y.o. 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  Patient presented to the ER now with worsening swelling of the right lower extremity from the thigh all the way down.  Associated with pain redness and some subjective fever.  Denied any significant pain at the moment.  But has taken his pain medications.  Patient was seen in the ER and evaluated.  He was discharged on the 15th.  On arrival in the ER he appears to have significant cellulitis involving most of the right lower extremity.  Orthopedics consulted.  The joint itself does not appear to be infected.  He has full range of motion.  It is suspected that patient has cellulitis of the right lower extremity now.  He is therefore being admitted to the hospital for evaluation and treatment.  Denied any injury.  No new exposures..   ED Course: Temperature is 98 blood pressure 99/79 pulse 65 respirate of 16 oxygen sat 98% room air.  White count is 14.0 hemoglobin 11.3 and platelets 195.  Sodium 123 potassium 4.3 chloride 89 CO2 of 28 with BUN 25 creatinine 0.85 calcium 8.5.  COVID-19 screen is negative.  Urinalysis showed WBC 11-20 otherwise everything negative.  At this point patient is being admitted with cellulitis of the right lower extremity.  Assessment & Plan:   Principal Problem:   Cellulitis and abscess of right leg Active Problems:   Septic arthritis of knee (HCC)   Hyponatremia   Leucocytosis     #1 cellulitis of the right lower extremity S/p arthroscopic incision and drainage for septic right knee 09/27/2020 during recent hospitalization admitted with right  lower extremity cellulitis.  Failed IV Rocephin.  Started on vancomycin and cefepime.  Not much improvement compared to yesterday.  Orthopedics on board and we are considering consulting ID formally.  Appreciate their help.  DVT ruled out.  # 2.  Elevated bilirubin and alkaline phosphatase: Repeat labs in the morning.  #3  Chronic hyponatremia: Stable at his baseline.   DVT prophylaxis: enoxaparin (LOVENOX) injection 40 mg Start: 10/08/20 1000   Code Status: Full Code  Family Communication:  None present at bedside.  Plan of care discussed with patient in length and he verbalized understanding and agreed with it.  Status is: Inpatient  Remains inpatient appropriate because:IV treatments appropriate due to intensity of illness or inability to take PO  Dispo: The patient is from: Home              Anticipated d/c is to: Home              Patient currently is not medically stable to d/c.   Difficult to place patient No        Estimated body mass index is 29.84 kg/m as calculated from the following:   Height as of this encounter: 6' (1.829 m).   Weight as of this encounter: 99.8 kg.      Nutritional status:               Consultants:  Orthopedics  Procedures:  None  Antimicrobials:  Anti-infectives (From admission, onward)  Start     Dose/Rate Route Frequency Ordered Stop   10/08/20 1000  vancomycin (VANCOREADY) IVPB 1250 mg/250 mL        1,250 mg 166.7 mL/hr over 90 Minutes Intravenous Every 12 hours 10/07/20 2127     10/08/20 0800  vancomycin (VANCOREADY) IVPB 1250 mg/250 mL  Status:  Discontinued        1,250 mg 166.7 mL/hr over 90 Minutes Intravenous Every 12 hours 10/07/20 2018 10/07/20 2127   10/08/20 0600  ceFEPIme (MAXIPIME) 2 g in sodium chloride 0.9 % 100 mL IVPB        2 g 200 mL/hr over 30 Minutes Intravenous Every 8 hours 10/07/20 2018     10/07/20 2130  vancomycin (VANCOREADY) IVPB 2000 mg/400 mL        2,000 mg 200 mL/hr over 120 Minutes  Intravenous  Once 10/07/20 2127 10/08/20 0024   10/07/20 2030  vancomycin (VANCOCIN) IVPB 1000 mg/200 mL premix  Status:  Discontinued        1,000 mg 200 mL/hr over 60 Minutes Intravenous  Once 10/07/20 2018 10/07/20 2127   10/07/20 2030  ceFEPIme (MAXIPIME) 2 g in sodium chloride 0.9 % 100 mL IVPB        2 g 200 mL/hr over 30 Minutes Intravenous  Once 10/07/20 2018 10/07/20 2123   10/07/20 2015  cefTRIAXone (ROCEPHIN) 1 g in sodium chloride 0.9 % 100 mL IVPB  Status:  Discontinued        1 g 200 mL/hr over 30 Minutes Intravenous  Once 10/07/20 2003 10/07/20 2018   10/07/20 1945  cefTRIAXone (ROCEPHIN) 2 g in sodium chloride 0.9 % 100 mL IVPB  Status:  Discontinued        2 g 200 mL/hr over 30 Minutes Intravenous  Once 10/07/20 1942 10/07/20 2003   10/07/20 1915  cefTRIAXone (ROCEPHIN) 1 g in sodium chloride 0.9 % 100 mL IVPB  Status:  Discontinued        1 g 200 mL/hr over 30 Minutes Intravenous  Once 10/07/20 1902 10/07/20 2019   10/07/20 1915  vancomycin (VANCOCIN) IVPB 1000 mg/200 mL premix  Status:  Discontinued        1,000 mg 200 mL/hr over 60 Minutes Intravenous  Once 10/07/20 1902 10/07/20 2127          Subjective: Seen and examined.  Complains of pain in the right lower extremity.  No fever.  No other complaint.  Objective: Vitals:   10/08/20 0132 10/08/20 0606 10/08/20 0924 10/08/20 1106  BP: 121/77 (!) 142/87 (!) 151/121 (!) 167/92  Pulse: 82 83 86 93  Resp: 16 16 18 18   Temp:      TempSrc:      SpO2: 98% 98% 97% 99%  Weight:      Height:        Intake/Output Summary (Last 24 hours) at 10/08/2020 1341 Last data filed at 10/08/2020 10/10/2020 Gross per 24 hour  Intake 94.05 ml  Output --  Net 94.05 ml   Filed Weights   10/07/20 1716  Weight: 99.8 kg    Examination:  General exam: Appears calm and comfortable  Respiratory system: Clear to auscultation. Respiratory effort normal. Cardiovascular system: S1 & S2 heard, RRR. No JVD, murmurs, rubs, gallops or  clicks. No pedal edema. Gastrointestinal system: Abdomen is nondistended, soft and nontender. No organomegaly or masses felt. Normal bowel sounds heard. Central nervous system: Alert and oriented. No focal neurological deficits. Extremities: Symmetric 5 x 5 power. Skin: Erythema  and subcutaneous edema involving right lower extremity circumferentially, pictures below. Psychiatry: Judgement and insight appear normal. Mood & affect appropriate.       Data Reviewed: I have personally reviewed following labs and imaging studies  CBC: Recent Labs  Lab 10/07/20 1838 10/08/20 0130 10/08/20 0539  WBC 15.6* 14.0* 17.5*  NEUTROABS 13.0*  --   --   HGB 11.0* 11.3* 11.3*  HCT 31.0* 32.3* 31.6*  MCV 100.3* 100.9* 100.0  PLT 181 195 206   Basic Metabolic Panel: Recent Labs  Lab 10/07/20 1838 10/08/20 0130 10/08/20 0539  NA 123*  --  128*  K 4.1  --  4.3  CL 89*  --  95*  CO2 28  --  28  GLUCOSE 79  --  86  BUN 25*  --  16  CREATININE 0.95 0.85 0.76  CALCIUM 8.5*  --  8.8*   GFR: Estimated Creatinine Clearance: 123.1 mL/min (by C-G formula based on SCr of 0.76 mg/dL). Liver Function Tests: Recent Labs  Lab 10/08/20 0539  AST 59*  ALT 43  ALKPHOS 341*  BILITOT 4.4*  PROT 7.1  ALBUMIN 2.2*   No results for input(s): LIPASE, AMYLASE in the last 168 hours. No results for input(s): AMMONIA in the last 168 hours. Coagulation Profile: No results for input(s): INR, PROTIME in the last 168 hours. Cardiac Enzymes: No results for input(s): CKTOTAL, CKMB, CKMBINDEX, TROPONINI in the last 168 hours. BNP (last 3 results) No results for input(s): PROBNP in the last 8760 hours. HbA1C: No results for input(s): HGBA1C in the last 72 hours. CBG: No results for input(s): GLUCAP in the last 168 hours. Lipid Profile: No results for input(s): CHOL, HDL, LDLCALC, TRIG, CHOLHDL, LDLDIRECT in the last 72 hours. Thyroid Function Tests: No results for input(s): TSH, T4TOTAL, FREET4, T3FREE,  THYROIDAB in the last 72 hours. Anemia Panel: No results for input(s): VITAMINB12, FOLATE, FERRITIN, TIBC, IRON, RETICCTPCT in the last 72 hours. Sepsis Labs: Recent Labs  Lab 10/07/20 1848 10/08/20 0130  LATICACIDVEN 0.9 1.0    Recent Results (from the past 240 hour(s))  Culture, blood (routine x 2)     Status: None (Preliminary result)   Collection Time: 10/07/20  6:25 PM   Specimen: BLOOD LEFT FOREARM  Result Value Ref Range Status   Specimen Description   Final    BLOOD LEFT FOREARM Performed at [facility] [address] [place], [place] [address]    Special Requests   Final    BOTTLES DRAWN AEROBIC ONLY Blood Culture results may not be optimal due to an inadequate volume of blood received in culture bottles Performed at [facility], [address] [place], [place] [address]    Culture   Final    NO GROWTH < 12 HOURS Performed at [facility], [address], [place], [place] [address]    Report Status PENDING  Incomplete  Culture, blood (routine x 2)     Status: None (Preliminary result)   Collection Time: 10/07/20  6:35 PM   Specimen: Site Not Specified; Blood  Result Value Ref Range Status   Specimen Description   Final    SITE NOT SPECIFIED Performed at [facility], [address] [place], [place] [address]    Special Requests   Final    BOTTLES DRAWN AEROBIC AND ANAEROBIC Blood Culture adequate volume Performed at [facility], [address] [place], [place] [address]    Culture  Final    NO GROWTH < 12 HOURS Performed at [facility], [address] [place], [place] [address]    Report Status PENDING  Incomplete  Resp Panel by RT-PCR (Flu A&B, Covid) Nasopharyngeal Swab     Status: None   Collection Time: 10/07/20  6:38 PM   Specimen: Nasopharyngeal Swab; Nasopharyngeal(NP) swabs in vial transport medium  Result Value Ref Range Status   SARS Coronavirus 2 by  RT PCR NEGATIVE NEGATIVE Final    Comment: (NOTE) SARS-CoV-2 target nucleic acids are NOT DETECTED.  The SARS-CoV-2 RNA is generally detectable in upper respiratory specimens during the acute phase of infection. The lowest concentration of SARS-CoV-2 viral copies this assay can detect is 138 copies/mL. A negative result does not preclude SARS-Cov-2 infection and should not be used as the sole basis for treatment or other patient management decisions. A negative result may occur with  improper specimen collection/handling, submission of specimen other than nasopharyngeal swab, presence of viral mutation(s) within the areas targeted by this assay, and inadequate number of viral copies(<138 copies/mL). A negative result must be combined with clinical observations, patient history, and epidemiological information. The expected result is Negative.  Fact Sheet for Patients:  BloggerCourse.com  Fact Sheet for Healthcare Providers:  SeriousBroker.it  This test is no t yet approved or cleared by the [place] FDA and  has been authorized for detection and/or diagnosis of SARS-CoV-2 by FDA under an Emergency Use Authorization (EUA). This EUA will remain  in effect (meaning this test can be used) for the duration of the COVID-19 declaration under Section 564(b)(1) of the Act, 21 U.S.C.section 360bbb-3(b)(1), unless the authorization is terminated  or revoked sooner.       Influenza A by PCR NEGATIVE NEGATIVE Final   Influenza B by PCR NEGATIVE NEGATIVE Final    Comment: (NOTE) The Xpert Xpress SARS-CoV-2/FLU/RSV plus assay is intended as an aid in the diagnosis of influenza from Nasopharyngeal swab specimens and should not be used as a sole basis for treatment. Nasal washings and aspirates are unacceptable for Xpert Xpress SARS-CoV-2/FLU/RSV testing.  Fact Sheet for Patients: BloggerCourse.com  Fact Sheet  for Healthcare Providers: SeriousBroker.it  This test is not yet approved or cleared by the [place] FDA and has been authorized for detection and/or diagnosis of SARS-CoV-2 by FDA under an Emergency Use Authorization (EUA). This EUA will remain in effect (meaning this test can be used) for the duration of the COVID-19 declaration under Section 564(b)(1) of the Act, 21 U.S.C. section 360bbb-3(b)(1), unless the authorization is terminated or revoked.  Performed at [facility], [address] [place], [place] [address]       Radiology Studies: DG Chest 2 View  Result Date: 10/07/2020 CLINICAL DATA:  Possible sepsis EXAM: CHEST - 2 VIEW COMPARISON:  09/27/2020 FINDINGS: Cardiac shadow is within normal limits. Right PICC line is seen with the tip in the proximal SVC. Lungs are clear. No bony abnormality is noted. IMPRESSION: No acute abnormality noted. Electronically Signed   By: Alcide Clever M.D.   On: 10/07/2020 19:45   VAS [place] LOWER EXTREMITY VENOUS (DVT)  Result Date: 10/08/2020 --  Lower Venous DVT Study Patient Name:  YOSHIAKI KREUSER  Date of Exam:   10/07/2020 Medical Rec #: [id number]        Accession #:    [id number] Date of Birth: [birth year]        Patient Gender: M Patient Age:   30Y Exam Location:  [facility] Procedure:      VAS US LOWER EXTREMITY VENOUS (DVT) Referring Phys: [id number] Oretha CapriceKEVAN D MCCLUNG --------------------------------------------------------------------------------  Indications: Patient had right knee drainage on 09/27/2020. He has noticed increased swelling, pain and erythema since. He is taking antibotics and denies fever, chest pain and SOB.  Comparison Study: None Performing Technologist: Alecia Mackin RVT, RDCS (AE), RDMS  Examination Guidelines: A complete evaluation includes B-mode imaging, spectral Doppler, color Doppler, and power Doppler as needed of all accessible portions of each vessel. Bilateral testing is  considered an integral part of a complete examination. Limited examinations for reoccurring indications may be performed as noted. The reflux portion of the exam is performed with the patient in reverse Trendelenburg.  +---------+---------------+---------+-----------+----------+--------------+ RIGHT    CompressibilityPhasicitySpontaneityPropertiesThrombus Aging +---------+---------------+---------+-----------+----------+--------------+ CFV      Full           Yes      Yes                                 +---------+---------------+---------+-----------+----------+--------------+ SFJ      Full           Yes      Yes                                 +---------+---------------+---------+-----------+----------+--------------+ FV Prox  Full           Yes      Yes                                 +---------+---------------+---------+-----------+----------+--------------+ FV Mid   Full           Yes      Yes                                 +---------+---------------+---------+-----------+----------+--------------+ FV DistalFull           Yes      Yes                                 +---------+---------------+---------+-----------+----------+--------------+ PFV      Full                                                        +---------+---------------+---------+-----------+----------+--------------+ POP      Full           Yes      Yes                                 +---------+---------------+---------+-----------+----------+--------------+ PTV      Full           Yes      Yes                                 +---------+---------------+---------+-----------+----------+--------------+ PERO     Full           Yes  Yes                                 +---------+---------------+---------+-----------+----------+--------------+ Gastroc  Full                                                         +---------+---------------+---------+-----------+----------+--------------+ GSV      Full           Yes      Yes                                 +---------+---------------+---------+-----------+----------+--------------+   +----+---------------+---------+-----------+----------+--------------+ LEFTCompressibilityPhasicitySpontaneityPropertiesThrombus Aging +----+---------------+---------+-----------+----------+--------------+ CFV Full           Yes      Yes                                 +----+---------------+---------+-----------+----------+--------------+    Findings reported to [employer] PA at 4:30.  Summary: RIGHT: - No evidence of deep vein thrombosis in the lower extremity. No indirect evidence of obstruction proximal to the inguinal ligament. - Multiple groin lymph nodes noted in groin witht the largest lymph node measuring 2.9 x .7 x .8 cm. Complex Baker's cyst measures 7.5 x 3.4 x 3.9 cm. Moderate amount of complex suprapatellar fluid. Moderate superficial edema noted in anterior thigh and calf.  LEFT: - No evidence of common femoral vein obstruction.  *See table(s) above for measurements and observations. Electronically signed by Charlton Haws MD on 10/08/2020 at 9:39:55 AM.    Final     Scheduled Meds:  enoxaparin (LOVENOX) injection  40 mg Subcutaneous Q24H   Continuous Infusions:  sodium chloride 100 mL/hr at 10/08/20 1027   ceFEPime (MAXIPIME) IV Stopped (10/08/20 3875)   vancomycin Stopped (10/08/20 1156)     LOS: 1 day   Time spent: 34 minutes   Hughie Closs, MD [facility]  10/08/2020, 1:41 PM   How to contact the [facility] Attending or Consulting provider 7A - 7P or covering provider during after hours 7P -7A, for this patient?  Check the care team in [facility] and look for a) attending/consulting [facility] provider listed and b) the [facility] team listed. Page or secure chat 7A-7P. Log into [email or web address] and use [facility]'s universal password to access. If you do not  have the password, please contact the hospital operator. Locate the [facility] provider you are looking for under [facility] and page to a number that you can be directly reached. If you still have difficulty reaching the provider, please page the [facility] (Director on Call) for the Hospitalists listed on amion for assistance.

## 2020-10-09 ENCOUNTER — Inpatient Hospital Stay (HOSPITAL_COMMUNITY): Payer: Self-pay | Admitting: Anesthesiology

## 2020-10-09 ENCOUNTER — Encounter (HOSPITAL_COMMUNITY): Payer: Self-pay | Admitting: Internal Medicine

## 2020-10-09 ENCOUNTER — Encounter (HOSPITAL_COMMUNITY)
Admission: EM | Disposition: A | Discharge status: Home Health | Payer: Self-pay | Source: Home / Self Care | Attending: Family Medicine

## 2020-10-09 HISTORY — PX: KNEE ARTHROSCOPY: SHX127

## 2020-10-09 HISTORY — PX: I & D EXTREMITY: SHX5045

## 2020-10-09 LAB — CBC WITH DIFFERENTIAL/PLATELET
Abs Immature Granulocytes: 0.11 10*3/uL — ABNORMAL HIGH (ref 0.00–0.07)
Basophils Absolute: 0 10*3/uL (ref 0.0–0.1)
Basophils Relative: 0 %
Eosinophils Absolute: 0.1 10*3/uL (ref 0.0–0.5)
Eosinophils Relative: 1 %
HCT: 27.9 % — ABNORMAL LOW (ref 39.0–52.0)
Hemoglobin: 9.8 g/dL — ABNORMAL LOW (ref 13.0–17.0)
Immature Granulocytes: 1 %
Lymphocytes Relative: 18 %
Lymphs Abs: 2 10*3/uL (ref 0.7–4.0)
MCH: 35.4 pg — ABNORMAL HIGH (ref 26.0–34.0)
MCHC: 35.1 g/dL (ref 30.0–36.0)
MCV: 100.7 fL — ABNORMAL HIGH (ref 80.0–100.0)
Monocytes Absolute: 1.7 10*3/uL — ABNORMAL HIGH (ref 0.1–1.0)
Monocytes Relative: 15 %
Neutro Abs: 7.2 10*3/uL (ref 1.7–7.7)
Neutrophils Relative %: 65 %
Platelets: 206 10*3/uL (ref 150–400)
RBC: 2.77 MIL/uL — ABNORMAL LOW (ref 4.22–5.81)
RDW: 14.3 % (ref 11.5–15.5)
WBC: 11.1 10*3/uL — ABNORMAL HIGH (ref 4.0–10.5)
nRBC: 0 % (ref 0.0–0.2)

## 2020-10-09 LAB — BASIC METABOLIC PANEL
Anion gap: 5 (ref 5–15)
BUN: 15 mg/dL (ref 6–20)
CO2: 27 mmol/L (ref 22–32)
Calcium: 8.7 mg/dL — ABNORMAL LOW (ref 8.9–10.3)
Chloride: 97 mmol/L — ABNORMAL LOW (ref 98–111)
Creatinine, Ser: 0.58 mg/dL — ABNORMAL LOW (ref 0.61–1.24)
GFR, Estimated: >60 mL/min (ref >60)
Glucose, Bld: 88 mg/dL (ref 70–99)
Potassium: 4 mmol/L (ref 3.5–5.1)
Sodium: 129 mmol/L — ABNORMAL LOW (ref 135–145)

## 2020-10-09 LAB — SURGICAL PCR SCREEN
MRSA, PCR: NEGATIVE
Staphylococcus aureus: NEGATIVE

## 2020-10-09 SURGERY — ARTHROSCOPY, KNEE
Anesthesia: General | Site: Knee | Laterality: Right

## 2020-10-09 MED ORDER — FENTANYL CITRATE (PF) 100 MCG/2ML IJ SOLN
INTRAMUSCULAR | Status: AC
  Filled 2020-10-09: qty 2

## 2020-10-09 MED ORDER — LABETALOL HCL 5 MG/ML IV SOLN
10.0000 mg | Freq: Once | INTRAVENOUS | Status: AC
  Administered 2020-10-09: 10 mg via INTRAVENOUS

## 2020-10-09 MED ORDER — PROPOFOL 10 MG/ML IV BOLUS
INTRAVENOUS | Status: AC
  Filled 2020-10-09: qty 40

## 2020-10-09 MED ORDER — METOCLOPRAMIDE HCL 5 MG/ML IJ SOLN
5.0000 mg | Freq: Three times a day (TID) | INTRAMUSCULAR | Status: DC | PRN
Start: 2020-10-09 — End: 2020-10-14

## 2020-10-09 MED ORDER — LACTATED RINGERS IV SOLN: INTRAVENOUS | Status: DC | PRN

## 2020-10-09 MED ORDER — MIDAZOLAM HCL 2 MG/2ML IJ SOLN
INTRAMUSCULAR | Status: AC
  Filled 2020-10-09: qty 2

## 2020-10-09 MED ORDER — METOCLOPRAMIDE HCL 5 MG PO TABS: 5.0000 mg | ORAL_TABLET | Freq: Three times a day (TID) | ORAL | Status: DC | PRN

## 2020-10-09 MED ORDER — ONDANSETRON HCL 4 MG/2ML IJ SOLN
INTRAMUSCULAR | Status: DC | PRN
  Administered 2020-10-09: 4 mg via INTRAVENOUS

## 2020-10-09 MED ORDER — LIDOCAINE 2% (20 MG/ML) 5 ML SYRINGE
INTRAMUSCULAR | Status: AC
  Filled 2020-10-09: qty 5

## 2020-10-09 MED ORDER — TRANEXAMIC ACID-NACL 1000-0.7 MG/100ML-% IV SOLN
1000.0000 mg | INTRAVENOUS | Status: DC
  Filled 2020-10-09: qty 100

## 2020-10-09 MED ORDER — SUGAMMADEX SODIUM 500 MG/5ML IV SOLN
INTRAVENOUS | Status: AC
  Filled 2020-10-09: qty 5

## 2020-10-09 MED ORDER — DEXAMETHASONE SODIUM PHOSPHATE 10 MG/ML IJ SOLN
INTRAMUSCULAR | Status: AC
  Filled 2020-10-09: qty 1

## 2020-10-09 MED ORDER — HYDROMORPHONE HCL 1 MG/ML IJ SOLN
INTRAMUSCULAR | Status: DC | PRN
  Administered 2020-10-09 (×2): 1 mg via INTRAVENOUS

## 2020-10-09 MED ORDER — MIDAZOLAM HCL 5 MG/5ML IJ SOLN
INTRAMUSCULAR | Status: DC | PRN
  Administered 2020-10-09: 2 mg via INTRAVENOUS

## 2020-10-09 MED ORDER — LIDOCAINE 2% (20 MG/ML) 5 ML SYRINGE
INTRAMUSCULAR | Status: DC | PRN
  Administered 2020-10-09: 100 mg via INTRAVENOUS

## 2020-10-09 MED ORDER — DOCUSATE SODIUM 100 MG PO CAPS
100.0000 mg | ORAL_CAPSULE | Freq: Two times a day (BID) | ORAL | Status: DC
  Administered 2020-10-09 – 2020-10-14 (×10): 100 mg via ORAL
  Filled 2020-10-09 (×10): qty 1

## 2020-10-09 MED ORDER — OXYCODONE HCL 5 MG PO TABS
ORAL_TABLET | ORAL | Status: AC
  Filled 2020-10-09: qty 1

## 2020-10-09 MED ORDER — OXYCODONE HCL 5 MG/5ML PO SOLN: 5.0000 mg | Freq: Once | ORAL | Status: AC | PRN

## 2020-10-09 MED ORDER — METHOCARBAMOL 500 MG PO TABS
ORAL_TABLET | ORAL | Status: AC
  Filled 2020-10-09: qty 1

## 2020-10-09 MED ORDER — LABETALOL HCL 5 MG/ML IV SOLN
INTRAVENOUS | Status: AC
  Filled 2020-10-09: qty 4

## 2020-10-09 MED ORDER — ENOXAPARIN SODIUM 40 MG/0.4ML IJ SOSY
40.0000 mg | PREFILLED_SYRINGE | INTRAMUSCULAR | Status: DC
  Administered 2020-10-10 – 2020-10-14 (×3): 40 mg via SUBCUTANEOUS
  Filled 2020-10-09 (×3): qty 0.4

## 2020-10-09 MED ORDER — BUPIVACAINE-EPINEPHRINE (PF) 0.25% -1:200000 IJ SOLN
INTRAMUSCULAR | Status: AC
  Filled 2020-10-09: qty 30

## 2020-10-09 MED ORDER — FENTANYL CITRATE (PF) 100 MCG/2ML IJ SOLN
INTRAMUSCULAR | Status: DC | PRN
  Administered 2020-10-09 (×2): 50 ug via INTRAVENOUS
  Administered 2020-10-09: 100 ug via INTRAVENOUS
  Administered 2020-10-09: 50 ug via INTRAVENOUS

## 2020-10-09 MED ORDER — OXYCODONE HCL 5 MG PO TABS
5.0000 mg | ORAL_TABLET | Freq: Once | ORAL | Status: AC | PRN
  Administered 2020-10-09: 5 mg via ORAL

## 2020-10-09 MED ORDER — ONDANSETRON HCL 4 MG/2ML IJ SOLN: 4.0000 mg | Freq: Four times a day (QID) | INTRAMUSCULAR | Status: DC | PRN

## 2020-10-09 MED ORDER — FENTANYL CITRATE (PF) 100 MCG/2ML IJ SOLN
25.0000 ug | INTRAMUSCULAR | Status: DC | PRN
  Administered 2020-10-09 (×2): 25 ug via INTRAVENOUS
  Administered 2020-10-09: 50 ug via INTRAVENOUS

## 2020-10-09 MED ORDER — SODIUM CHLORIDE 0.9 % IR SOLN
Status: DC | PRN
  Administered 2020-10-09 (×3): 3000 mL

## 2020-10-09 MED ORDER — CHLORHEXIDINE GLUCONATE 4 % EX LIQD: 60.0000 mL | Freq: Once | CUTANEOUS | Status: DC

## 2020-10-09 MED ORDER — POVIDONE-IODINE 10 % EX SWAB
2.0000 "application " | Freq: Once | CUTANEOUS | Status: AC
  Administered 2020-10-09: 2 via TOPICAL

## 2020-10-09 MED ORDER — HYDROMORPHONE HCL 2 MG/ML IJ SOLN
INTRAMUSCULAR | Status: AC
  Filled 2020-10-09: qty 1

## 2020-10-09 MED ORDER — FENTANYL CITRATE (PF) 250 MCG/5ML IJ SOLN
INTRAMUSCULAR | Status: AC
  Filled 2020-10-09: qty 5

## 2020-10-09 MED ORDER — PROMETHAZINE HCL 25 MG/ML IJ SOLN: 6.2500 mg | INTRAMUSCULAR | Status: DC | PRN

## 2020-10-09 MED ORDER — PROPOFOL 10 MG/ML IV BOLUS
INTRAVENOUS | Status: DC | PRN
  Administered 2020-10-09: 150 mg via INTRAVENOUS

## 2020-10-09 MED ORDER — ONDANSETRON HCL 4 MG PO TABS: 4.0000 mg | ORAL_TABLET | Freq: Four times a day (QID) | ORAL | Status: DC | PRN

## 2020-10-09 MED ORDER — ROCURONIUM BROMIDE 10 MG/ML (PF) SYRINGE
PREFILLED_SYRINGE | INTRAVENOUS | Status: AC
  Filled 2020-10-09: qty 10

## 2020-10-09 MED ORDER — ONDANSETRON HCL 4 MG/2ML IJ SOLN
INTRAMUSCULAR | Status: AC
  Filled 2020-10-09: qty 2

## 2020-10-09 SURGICAL SUPPLY — 67 items
APL PRP STRL LF DISP 70% ISPRP (MISCELLANEOUS) ×2
BAG COUNTER SPONGE SURGICOUNT (BAG) ×2 IMPLANT
BAG SPNG CNTER NS LX DISP (BAG)
BANDAGE ESMARK 6X9 LF (GAUZE/BANDAGES/DRESSINGS) IMPLANT
BLADE SHAVER TORPEDO 4X13 (MISCELLANEOUS) ×3 IMPLANT
BNDG CMPR 9X6 STRL LF SNTH (GAUZE/BANDAGES/DRESSINGS)
BNDG COHESIVE 4X5 TAN STRL (GAUZE/BANDAGES/DRESSINGS) ×3 IMPLANT
BNDG ELASTIC 3X5.8 VLCR STR LF (GAUZE/BANDAGES/DRESSINGS) IMPLANT
BNDG ELASTIC 4X5.8 VLCR STR LF (GAUZE/BANDAGES/DRESSINGS) ×1 IMPLANT
BNDG ELASTIC 6X5.8 VLCR STR LF (GAUZE/BANDAGES/DRESSINGS) ×3 IMPLANT
BNDG ESMARK 6X9 LF (GAUZE/BANDAGES/DRESSINGS)
CHLORAPREP W/TINT 26 (MISCELLANEOUS) ×3 IMPLANT
COVER SURGICAL LIGHT HANDLE (MISCELLANEOUS) ×3 IMPLANT
COVER WAND RF STERILE (DRAPES) ×2 IMPLANT
CUFF TOURN SGL QUICK 34 (TOURNIQUET CUFF)
CUFF TRNQT CYL 34X4.125X (TOURNIQUET CUFF) ×2 IMPLANT
CUTTER BONE 4.0MM X 13CM (MISCELLANEOUS) ×1 IMPLANT
DRAIN CHANNEL 10M FLAT 3/4 FLT (DRAIN) ×1 IMPLANT
DRAIN PENROSE 0.25X18 (DRAIN) ×2 IMPLANT
DRAPE ARTHROSCOPY W/POUCH 114 (DRAPES) ×3 IMPLANT
DRAPE U-SHAPE 47X51 STRL (DRAPES) ×3 IMPLANT
DRSG ADAPTIC 3X8 NADH LF (GAUZE/BANDAGES/DRESSINGS) ×2 IMPLANT
DRSG EMULSION OIL 3X3 NADH (GAUZE/BANDAGES/DRESSINGS) ×3 IMPLANT
DRSG PAD ABDOMINAL 8X10 ST (GAUZE/BANDAGES/DRESSINGS) ×6 IMPLANT
DURAPREP 26ML APPLICATOR (WOUND CARE) ×2 IMPLANT
ELECT PENCIL ROCKER SW 15FT (MISCELLANEOUS) ×1 IMPLANT
ELECT REM PT RETURN 15FT ADLT (MISCELLANEOUS) IMPLANT
EVACUATOR SILICONE 100CC (DRAIN) ×1 IMPLANT
GAUZE SPONGE 4X4 12PLY STRL (GAUZE/BANDAGES/DRESSINGS) ×6 IMPLANT
GAUZE XEROFORM 1X8 LF (GAUZE/BANDAGES/DRESSINGS) ×3 IMPLANT
GLOVE SRG 8 PF TXTR STRL LF DI (GLOVE) ×4 IMPLANT
GLOVE SURG ENC MOIS LTX SZ7.5 (GLOVE) ×6 IMPLANT
GLOVE SURG UNDER POLY LF SZ8 (GLOVE) ×6
GOWN STRL REUS W/ TWL LRG LVL3 (GOWN DISPOSABLE) ×4 IMPLANT
GOWN STRL REUS W/ TWL XL LVL3 (GOWN DISPOSABLE) ×2 IMPLANT
GOWN STRL REUS W/TWL LRG LVL3 (GOWN DISPOSABLE) ×6
GOWN STRL REUS W/TWL XL LVL3 (GOWN DISPOSABLE) ×9 IMPLANT
HANDPIECE INTERPULSE COAX TIP (DISPOSABLE)
IV NS IRRIG 3000ML ARTHROMATIC (IV SOLUTION) ×7 IMPLANT
KIT BASIN OR (CUSTOM PROCEDURE TRAY) ×3 IMPLANT
KIT TURNOVER CYSTO (KITS) ×3 IMPLANT
KIT TURNOVER KIT A (KITS) ×3 IMPLANT
MANIFOLD NEPTUNE II (INSTRUMENTS) ×3 IMPLANT
NS IRRIG 1000ML POUR BTL (IV SOLUTION) ×4 IMPLANT
NS IRRIG 500ML POUR BTL (IV SOLUTION) ×2 IMPLANT
PACK ARTHROSCOPY DSU (CUSTOM PROCEDURE TRAY) ×3 IMPLANT
PACK BASIN DAY SURGERY FS (CUSTOM PROCEDURE TRAY) ×3 IMPLANT
PACK ORTHO EXTREMITY (CUSTOM PROCEDURE TRAY) ×3 IMPLANT
PAD ARMBOARD 7.5X6 YLW CONV (MISCELLANEOUS) IMPLANT
PADDING CAST COTTON 6X4 STRL (CAST SUPPLIES) ×4 IMPLANT
PENCIL SMOKE EVACUATOR (MISCELLANEOUS) IMPLANT
PORT APPOLLO RF 90DEGREE MULTI (SURGICAL WAND) IMPLANT
SET HNDPC FAN SPRY TIP SCT (DISPOSABLE) IMPLANT
SUT ETHILON 3 0 PS 1 (SUTURE) ×3 IMPLANT
SUT ETHILON 4 0 PS 2 18 (SUTURE) ×3 IMPLANT
SUT MNCRL AB 4-0 PS2 18 (SUTURE) ×3 IMPLANT
SUT MON AB 2-0 SH 27 (SUTURE) ×3
SUT MON AB 2-0 SH27 (SUTURE) IMPLANT
SYR CONTROL 10ML LL (SYRINGE) IMPLANT
TOWEL OR 17X26 10 PK STRL BLUE (TOWEL DISPOSABLE) ×3 IMPLANT
TOWEL OR NON WOVEN STRL DISP B (DISPOSABLE) ×3 IMPLANT
TUBE CONNECTING 12X1/4 (SUCTIONS) ×3 IMPLANT
TUBING ARTHROSCOPY IRRIG 16FT (MISCELLANEOUS) ×3 IMPLANT
TUBING UROLOGY SET (TUBING) ×1 IMPLANT
UNDERPAD 30X36 HEAVY ABSORB (UNDERPADS AND DIAPERS) ×4 IMPLANT
WAND APOLLORF SJ50 AR-9845 (SURGICAL WAND) IMPLANT
WATER STERILE IRR 1000ML POUR (IV SOLUTION) ×3 IMPLANT

## 2020-10-09 NOTE — Progress Notes (Signed)
None PROGRESS NOTE    Kyle Pitts Nowakowski  RUE:[id number] DOB: 04/18/1961 DOA: 10/07/2020 PCP: Stacie GlazeJenkins, John E, MD   Brief Narrative:  HPI: Kyle Pitts Burgert is Pitts 60 y.o. 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  Patient presented to the ER now with worsening swelling of the right lower extremity from the thigh all the way down.  Associated with pain redness and some subjective fever.  Denied any significant pain at the moment.  But has taken his pain medications.  Patient was seen in the ER and evaluated.  He was discharged on the 15th.  On arrival in the ER he appears to have significant cellulitis involving most of the right lower extremity.  Orthopedics consulted.  The joint itself does not appear to be infected.  He has full range of motion.  It is suspected that patient has cellulitis of the right lower extremity now.  He is therefore being admitted to the hospital for evaluation and treatment.  Denied any injury.  No new exposures..   ED Course: Temperature is 98 blood pressure 99/79 pulse 65 respirate of 16 oxygen sat 98% room air.  White count is 14.0 hemoglobin 11.3 and platelets 195.  Sodium 123 potassium 4.3 chloride 89 CO2 of 28 with BUN 25 creatinine 0.85 calcium 8.5.  COVID-19 screen is negative.  Urinalysis showed WBC 11-20 otherwise everything negative.  At this point patient is being admitted with cellulitis of the right lower extremity.  Assessment & Plan:   Principal Problem:   Cellulitis and abscess of right leg Active Problems:   Septic arthritis of knee (HCC)   Hyponatremia   Leucocytosis     #1 cellulitis of the right lower extremity S/p arthroscopic incision and drainage for septic right knee 09/27/2020 during recent hospitalization admitted with right  lower extremity cellulitis.  Failed IV Rocephin.  Started on vancomycin and cefepime.  Ortho consulted.  They obtained MRI right lower extremity which shows extensive severe cellulitis and mild fasciitis involving anterior compartment and posterior compartment as well as lower leg with possible intramuscular abscess in anterior compartment in the lower leg.  Moderate to large sized joint effusion on the right knee with possible septic arthritis and osteomyelitis.  Patient's erythema/cellulitis has improved somewhat today.  I will continue current antibiotics and defer to orthopedics for his septic arthritis/osteomyelitis or for consulting ID as they were thinking of yesterday.  # 2.  Elevated bilirubin and alkaline phosphatase: Repeat labs in the morning.  #3  Chronic hyponatremia: Stable at his baseline.   DVT prophylaxis: enoxaparin (LOVENOX) injection 40 mg Start: 10/08/20 1000   Code Status: Full Code  Family Communication:  None present at bedside.  Plan of care discussed with patient in length and he verbalized understanding and agreed with it.  Status is: Inpatient  Remains inpatient appropriate because:IV treatments appropriate due to intensity of illness or inability to take PO  Dispo: The patient is from: Home              Anticipated d/c is to: Home              Patient currently is not medically stable to d/c.   Difficult to place patient No        Estimated body mass index is  29.84 kg/m as calculated from the following:   Height as of this encounter: 6' (1.829 m).   Weight as of this encounter: 99.8 kg.      Nutritional status:               Consultants:  Orthopedics  Procedures:  None  Antimicrobials:  Anti-infectives (From admission, onward)    Start     Dose/Rate Route Frequency Ordered Stop   10/08/20 1000  vancomycin (VANCOREADY) IVPB 1250 mg/250 mL        1,250 mg 166.7 mL/hr over 90 Minutes Intravenous Every 12 hours 10/07/20 2127      10/08/20 0800  vancomycin (VANCOREADY) IVPB 1250 mg/250 mL  Status:  Discontinued        1,250 mg 166.7 mL/hr over 90 Minutes Intravenous Every 12 hours 10/07/20 2018 10/07/20 2127   10/08/20 0600  ceFEPIme (MAXIPIME) 2 g in sodium chloride 0.9 % 100 mL IVPB        2 g 200 mL/hr over 30 Minutes Intravenous Every 8 hours 10/07/20 2018     10/07/20 2130  vancomycin (VANCOREADY) IVPB 2000 mg/400 mL        2,000 mg 200 mL/hr over 120 Minutes Intravenous  Once 10/07/20 2127 10/08/20 0024   10/07/20 2030  vancomycin (VANCOCIN) IVPB 1000 mg/200 mL premix  Status:  Discontinued        1,000 mg 200 mL/hr over 60 Minutes Intravenous  Once 10/07/20 2018 10/07/20 2127   10/07/20 2030  ceFEPIme (MAXIPIME) 2 g in sodium chloride 0.9 % 100 mL IVPB        2 g 200 mL/hr over 30 Minutes Intravenous  Once 10/07/20 2018 10/07/20 2123   10/07/20 2015  cefTRIAXone (ROCEPHIN) 1 g in sodium chloride 0.9 % 100 mL IVPB  Status:  Discontinued        1 g 200 mL/hr over 30 Minutes Intravenous  Once 10/07/20 2003 10/07/20 2018   10/07/20 1945  cefTRIAXone (ROCEPHIN) 2 g in sodium chloride 0.9 % 100 mL IVPB  Status:  Discontinued        2 g 200 mL/hr over 30 Minutes Intravenous  Once 10/07/20 1942 10/07/20 2003   10/07/20 1915  cefTRIAXone (ROCEPHIN) 1 g in sodium chloride 0.9 % 100 mL IVPB  Status:  Discontinued        1 g 200 mL/hr over 30 Minutes Intravenous  Once 10/07/20 1902 10/07/20 2019   10/07/20 1915  vancomycin (VANCOCIN) IVPB 1000 mg/200 mL premix  Status:  Discontinued        1,000 mg 200 mL/hr over 60 Minutes Intravenous  Once 10/07/20 1902 10/07/20 2127          Subjective: Patient seen and examined.  No new complaint.  Right lower extremity pain is improving. Objective: Vitals:   10/08/20 2146 10/09/20 0156 10/09/20 0618 10/09/20 0950  BP: (!) 151/91 (!) 159/87 (!) 168/92 (!) 158/87  Pulse: 89 83 81 85  Resp: Temp: 99.9 F (37.7 C) 98.4 F (36.9 C) 98.4 F (36.9 C) 98 F  (36.7 C)  TempSrc: Oral Oral Oral Oral  SpO2: 99% 100% 96% 97%  Weight:      Height:        Intake/Output Summary (Last 24 hours) at 10/09/2020 1147 Last data filed at 10/09/2020 1000 Gross per 24 hour  Intake 4225.34 ml  Output 2400 ml  Net 1825.34 ml    Filed Weights   10/07/20 1716  Weight:  99.8 kg    Examination:  General exam: Appears calm and comfortable  Respiratory system: Clear to auscultation. Respiratory effort normal. Cardiovascular system: S1 & S2 heard, RRR. No JVD, murmurs, rubs, gallops or clicks. No pedal edema. Gastrointestinal system: Abdomen is nondistended, soft and nontender. No organomegaly or masses felt. Normal bowel sounds heard. Central nervous system: Alert and oriented. No focal neurological deficits. Extremities: Symmetric 5 x 5 power. Skin: Erythema in the right lower extremity has Pitts started to improve.  Tenderness has improved as well.  Follow-up pictures are from the day of admission.   Psychiatry: Judgement and insight appear normal. Mood & affect appropriate.        Data Reviewed: I have personally reviewed following labs and imaging studies  CBC: Recent Labs  Lab 10/07/20 1838 10/08/20 0130 10/08/20 0539 10/09/20 0336  WBC 15.6* 14.0* 17.5* 11.1*  NEUTROABS 13.0*  --   --  7.2  HGB 11.0* 11.3* 11.3* 9.8*  HCT 31.0* 32.3* 31.6* 27.9*  MCV 100.3* 100.9* 100.0 100.7*  PLT 181 195 206 206    Basic Metabolic Panel: Recent Labs  Lab 10/07/20 1838 10/08/20 0130 10/08/20 0539 10/09/20 0336  NA 123*  --  128* 129*  K 4.1  --  4.3 4.0  CL 89*  --  95* 97*  CO2 28  --  28 27  GLUCOSE 79  --  86 88  BUN 25*  --  16 15  CREATININE 0.95 0.85 0.76 0.58*  CALCIUM 8.5*  --  8.8* 8.7*    GFR: Estimated Creatinine Clearance: 123.1 mL/min (Pitts) (by C-G formula based on SCr of 0.58 mg/dL (L)). Liver Function Tests: Recent Labs  Lab 10/08/20 0539  AST 59*  ALT 43  ALKPHOS 341*  BILITOT 4.4*  PROT 7.1  ALBUMIN 2.2*    No  results for input(s): LIPASE, AMYLASE in the last 168 hours. No results for input(s): AMMONIA in the last 168 hours. Coagulation Profile: No results for input(s): INR, PROTIME in the last 168 hours. Cardiac Enzymes: No results for input(s): CKTOTAL, CKMB, CKMBINDEX, TROPONINI in the last 168 hours. BNP (last 3 results) No results for input(s): PROBNP in the last 8760 hours. HbA1C: No results for input(s): HGBA1C in the last 72 hours. CBG: No results for input(s): GLUCAP in the last 168 hours. Lipid Profile: No results for input(s): CHOL, HDL, LDLCALC, TRIG, CHOLHDL, LDLDIRECT in the last 72 hours. Thyroid Function Tests: No results for input(s): TSH, T4TOTAL, FREET4, T3FREE, THYROIDAB in the last 72 hours. Anemia Panel: No results for input(s): VITAMINB12, FOLATE, FERRITIN, TIBC, IRON, RETICCTPCT in the last 72 hours. Sepsis Labs: Recent Labs  Lab 10/07/20 1848 10/08/20 0130  LATICACIDVEN 0.9 1.0     Recent Results (from the past 240 hour(s))  Culture, blood (routine x 2)     Status: None (Preliminary result)   Collection Time: 10/07/20  6:25 PM   Specimen: BLOOD LEFT FOREARM  Result Value Ref Range Status   Specimen Description   Final    BLOOD LEFT FOREARM Performed at [facility], [address], [place], [place] [address]    Special Requests   Final    BOTTLES DRAWN AEROBIC ONLY Blood Culture results may not be optimal due to an inadequate volume of blood received in culture bottles Performed at [facility], [address], [place], [place] [address]    Culture   Final    NO GROWTH 2 DAYS Performed at [facility], [address], [place], [place] [address]    Report Status PENDING  Incomplete  Culture, blood (routine x 2)     Status: None (Preliminary result)   Collection Time: 10/07/20  6:35 PM   Specimen: Site Not Specified; Blood  Result Value Ref Range Status   Specimen Description   Final    SITE NOT  SPECIFIED Performed at [facility], [address] [place], [place] [address]    Special Requests   Final    BOTTLES DRAWN AEROBIC AND ANAEROBIC Blood Culture adequate volume Performed at [facility], [address], [place], [place] [address]    Culture   Final    NO GROWTH 2 DAYS Performed at [facility], [address], [place], [place] [address]    Report Status PENDING  Incomplete  Resp Panel by RT-PCR (Flu Pitts&B, Covid) Nasopharyngeal Swab     Status: None   Collection Time: 10/07/20  6:38 PM   Specimen: Nasopharyngeal Swab; Nasopharyngeal(NP) swabs in vial transport medium  Result Value Ref Range Status   SARS Coronavirus 2 by RT PCR NEGATIVE NEGATIVE Final    Comment: (NOTE) SARS-CoV-2 target nucleic acids are NOT DETECTED.  The SARS-CoV-2 RNA is generally detectable in upper respiratory specimens during the acute phase of infection. The lowest concentration of SARS-CoV-2 viral copies this assay can detect is 138 copies/mL. Pitts negative result does not preclude SARS-Cov-2 infection and should not be used as the sole basis for treatment or other patient management decisions. Pitts negative result may occur with  improper specimen collection/handling, submission of specimen other than nasopharyngeal swab, presence of viral mutation(s) within the areas targeted by this assay, and inadequate number of viral copies(<138 copies/mL). Pitts negative result must be combined with clinical observations, patient history, and epidemiological information. The expected result is Negative.  Fact Sheet for Patients:  BloggerCourse.com  Fact Sheet for Healthcare Providers:  SeriousBroker.it  This test is no t yet approved or cleared by the [place] FDA and  has been authorized for detection and/or diagnosis of SARS-CoV-2 by FDA under an Emergency Use Authorization (EUA). This EUA will remain  in  effect (meaning this test can be used) for the duration of the COVID-19 declaration under Section 564(b)(1) of the Act, 21 U.S.C.section 360bbb-3(b)(1), unless the authorization is terminated  or revoked sooner.       Influenza Pitts by PCR NEGATIVE NEGATIVE Final   Influenza B by PCR NEGATIVE NEGATIVE Final    Comment: (NOTE) The Xpert Xpress SARS-CoV-2/FLU/RSV plus assay is intended as an aid in the diagnosis of influenza from Nasopharyngeal swab specimens and should not be used as Pitts sole basis for treatment. Nasal washings and aspirates are unacceptable for Xpert Xpress SARS-CoV-2/FLU/RSV testing.  Fact Sheet for Patients: BloggerCourse.com  Fact Sheet for Healthcare Providers: SeriousBroker.it  This test is not yet approved or cleared by the [place] FDA and has been authorized for detection and/or diagnosis of SARS-CoV-2 by FDA under an Emergency Use Authorization (EUA). This EUA will remain in effect (meaning this test can be used) for the duration of the COVID-19 declaration under Section 564(b)(1) of the Act, 21 U.S.C. section 360bbb-3(b)(1), unless the authorization is terminated or revoked.  Performed at [facility], [address], [place], [place] [address]   Surgical pcr screen     Status: None   Collection Time: 10/09/20  5:39 AM   Specimen: Nasal Mucosa; Nasal Swab  Result Value  Ref Range Status   MRSA, PCR NEGATIVE NEGATIVE Final   Staphylococcus aureus NEGATIVE NEGATIVE Final    Comment: (NOTE) The Xpert SA Assay (FDA approved for NASAL specimens in patients 9 years of age and older), is one component of Pitts comprehensive surveillance program. It is not intended to diagnose infection nor to guide or monitor treatment. Performed at [facility], [address], [place], [place] [address]        Radiology Studies: DG Chest 2 View  Result Date:  10/07/2020 CLINICAL DATA:  Possible sepsis EXAM: CHEST - 2 VIEW COMPARISON:  09/27/2020 FINDINGS: Cardiac shadow is within normal limits. Right PICC line is seen with the tip in the proximal SVC. Lungs are clear. No bony abnormality is noted. IMPRESSION: No acute abnormality noted. Electronically Signed   By: Alcide Clever M.D.   On: 10/07/2020 19:45   MR TIBIA FIBULA RIGHT WO CONTRAST  Result Date: 10/08/2020 CLINICAL DATA:  Soft tissue infection suspected, thigh, xray done. Recent admission for septic arthritis of the right knee, went to OR for washout on June 10th. EXAM: MRI OF THE RIGHT FEMUR WITHOUT CONTRAST MRI OF THE RIGHT LOWER EXTREMITY (TIBIA AND FIBULA) WITHOUT CONTRAST TECHNIQUE: Multiplanar, multisequence MR imaging of the right femur was performed. No intravenous contrast was administered. Multiplanar, multisequence MR imaging of the right lower extremity (tibia and fibula) was performed. No intravenous contrast was administered. COMPARISON:  Radiograph 09/27/2020 FINDINGS: Image degradation due to motion, particularly at the knee. Bones/Joint/Cartilage The cortex is intact. There is increased periarticular STIR signal in the knee, predominantly in the medial femoral condyle and medial tibial plateau. There is medial predominant arthritis and likely degenerative medial meniscal tearing. There is Pitts partially visualized joint effusion of the left knee, likely moderate to large size (coronal stir image 34). There is fluid collecting posterior medially at the knee, likely within Pitts large baker cyst (axial T2 image 8) and along the popliteus (axial T2 image 12). Ligaments Grossly intact. Muscles and Tendons Upper leg/thigh: There is extensive antrum muscular edema throughout the anterior compartment of the thigh, with some inter fascial edema. There is also inter fascial edema and thickening posteriorly. There is no focal fluid collection. Lower leg: There is intramuscular edema within the anterior  compartment of the lower leg, with central 1.5 x 0.6 cm collection (axial T2 fat-sat image 18). There is inter fascial edema and thickening (axial T2 fat-sat image 29). There is fluid collecting along the anterior aponeurosis of the medial head gastrocnemius muscle in the lower leg (axial T2 fat-sat image 27). Soft tissues There is extensive subcutaneous soft tissue swelling of the thigh and lower leg. IMPRESSION: Extensive soft tissue swelling of the thigh and lower leg as can be seen with severe cellulitis. Myofasciitis in the thigh involving the anterior compartment and to Pitts lesser degree the posterior compartment. Myofasciitis in the lower leg involving the anterior and posterior compartments as described above, with possible 1.5 x 0.6 cm intramuscular collection/developing abscess in the anterior compartment. Partially visualized, moderate to large sized joint effusion of the right knee, with fluid collecting posteriorly, likely within Pitts large Baker cyst and along the popliteus. Periarticular marrow signal changes predominantly in the medial femoral condyle and tibial plateau concerning for septic arthritis with osteomyelitis or severe degenerative arthritis. Note there is marked image degradation at the level of the knee. Electronically Signed   By: Caprice Renshaw   On: 10/08/2020 15:16   MR [facility] RIGHT WO CONTRAST  Result Date: 10/08/2020 CLINICAL DATA:  Soft tissue infection suspected, thigh, xray done. Recent admission for septic arthritis of the right knee, went to OR for washout on June 10th. EXAM: MRI OF THE RIGHT FEMUR WITHOUT CONTRAST MRI OF THE RIGHT LOWER EXTREMITY (TIBIA AND FIBULA) WITHOUT CONTRAST TECHNIQUE: Multiplanar, multisequence MR imaging of the right femur was performed. No intravenous contrast was administered. Multiplanar, multisequence MR imaging of the right lower extremity (tibia and fibula) was performed. No intravenous contrast was administered. COMPARISON:  Radiograph 09/27/2020  FINDINGS: Image degradation due to motion, particularly at the knee. Bones/Joint/Cartilage The cortex is intact. There is increased periarticular STIR signal in the knee, predominantly in the medial femoral condyle and medial tibial plateau. There is medial predominant arthritis and likely degenerative medial meniscal tearing. There is Pitts partially visualized joint effusion of the left knee, likely moderate to large size (coronal stir image 34). There is fluid collecting posterior medially at the knee, likely within Pitts large baker cyst (axial T2 image 8) and along the popliteus (axial T2 image 12). Ligaments Grossly intact. Muscles and Tendons Upper leg/thigh: There is extensive antrum muscular edema throughout the anterior compartment of the thigh, with some inter fascial edema. There is also inter fascial edema and thickening posteriorly. There is no focal fluid collection. Lower leg: There is intramuscular edema within the anterior compartment of the lower leg, with central 1.5 x 0.6 cm collection (axial T2 fat-sat image 18). There is inter fascial edema and thickening (axial T2 fat-sat image 29). There is fluid collecting along the anterior aponeurosis of the medial head gastrocnemius muscle in the lower leg (axial T2 fat-sat image 27). Soft tissues There is extensive subcutaneous soft tissue swelling of the thigh and lower leg. IMPRESSION: Extensive soft tissue swelling of the thigh and lower leg as can be seen with severe cellulitis. Myofasciitis in the thigh involving the anterior compartment and to Pitts lesser degree the posterior compartment. Myofasciitis in the lower leg involving the anterior and posterior compartments as described above, with possible 1.5 x 0.6 cm intramuscular collection/developing abscess in the anterior compartment. Partially visualized, moderate to large sized joint effusion of the right knee, with fluid collecting posteriorly, likely within Pitts large Baker cyst and along the popliteus.  Periarticular marrow signal changes predominantly in the medial femoral condyle and tibial plateau concerning for septic arthritis with osteomyelitis or severe degenerative arthritis. Note there is marked image degradation at the level of the knee. Electronically Signed   By: Caprice Renshaw   On: 10/08/2020 15:16   VAS [place] LOWER EXTREMITY VENOUS (DVT)  Result Date: 10/08/2020 --  Lower Venous DVT Study Patient Name:  Kyle Pitts  Date of Exam:   10/07/2020 Medical Rec #: [id number]        Accession #:    [id number] Date of Birth: 05/05/1960        Patient Gender: M Patient Age:   9Y Exam Location:  [facility] Procedure:      VAS [place] LOWER EXTREMITY VENOUS (DVT) Referring Phys: [id number] Arbie Cookey --------------------------------------------------------------------------------  Indications: Patient had right knee drainage on 09/27/2020. He has noticed increased swelling, pain and erythema since. He is taking antibotics and denies fever, chest pain and SOB.  Comparison Study: None Performing Technologist: Alecia Mackin RVT, RDCS (AE), RDMS  Examination Guidelines: Pitts complete evaluation includes B-mode imaging, spectral Doppler, color Doppler, and power Doppler as needed of all accessible portions of each vessel. Bilateral testing is considered an integral part of Pitts complete  examination. Limited examinations for reoccurring indications may be performed as noted. The reflux portion of the exam is performed with the patient in reverse Trendelenburg.  +---------+---------------+---------+-----------+----------+--------------+ RIGHT    CompressibilityPhasicitySpontaneityPropertiesThrombus Aging +---------+---------------+---------+-----------+----------+--------------+ CFV      Full           Yes      Yes                                 +---------+---------------+---------+-----------+----------+--------------+ SFJ      Full           Yes      Yes                                  +---------+---------------+---------+-----------+----------+--------------+ FV Prox  Full           Yes      Yes                                 +---------+---------------+---------+-----------+----------+--------------+ FV Mid   Full           Yes      Yes                                 +---------+---------------+---------+-----------+----------+--------------+ FV DistalFull           Yes      Yes                                 +---------+---------------+---------+-----------+----------+--------------+ PFV      Full                                                        +---------+---------------+---------+-----------+----------+--------------+ POP      Full           Yes      Yes                                 +---------+---------------+---------+-----------+----------+--------------+ PTV      Full           Yes      Yes                                 +---------+---------------+---------+-----------+----------+--------------+ PERO     Full           Yes      Yes                                 +---------+---------------+---------+-----------+----------+--------------+ Gastroc  Full                                                        +---------+---------------+---------+-----------+----------+--------------+  GSV      Full           Yes      Yes                                 +---------+---------------+---------+-----------+----------+--------------+   +----+---------------+---------+-----------+----------+--------------+ LEFTCompressibilityPhasicitySpontaneityPropertiesThrombus Aging +----+---------------+---------+-----------+----------+--------------+ CFV Full           Yes      Yes                                 +----+---------------+---------+-----------+----------+--------------+    Findings reported to Dion Saucier PA at 4:30.  Summary: RIGHT: - No evidence of deep vein thrombosis in the lower extremity. No indirect  evidence of obstruction proximal to the inguinal ligament. - Multiple groin lymph nodes noted in groin witht the largest lymph node measuring 2.9 x .7 x .8 cm. Complex Baker's cyst measures 7.5 x 3.4 x 3.9 cm. Moderate amount of complex suprapatellar fluid. Moderate superficial edema noted in anterior thigh and calf.  LEFT: - No evidence of common femoral vein obstruction.  *See table(s) above for measurements and observations. Electronically signed by Charlton Haws MD on 10/08/2020 at 9:39:55 AM.    Final     Scheduled Meds:  celecoxib  100 mg Oral BID   Chlorhexidine Gluconate Cloth  6 each Topical Daily   enoxaparin (LOVENOX) injection  40 mg Subcutaneous Q24H   sodium chloride flush  10-40 mL Intracatheter Q12H   Continuous Infusions:  sodium chloride 100 mL/hr at 10/08/20 1712   ceFEPime (MAXIPIME) IV 2 g (10/09/20 0531)   vancomycin 1,250 mg (10/08/20 2143)     LOS: 2 days   Time spent: 30 minutes   Hughie Closs, MD [facility]  10/09/2020, 11:47 AM   How to contact the [facility] Attending or Consulting provider 7A - 7P or covering provider during after hours 7P -7A, for this patient?  Check the care team in [facility] and look for Pitts) attending/consulting TR[facility] provider listed and b) the [facility] team listed. Page or secure chat 7A-7P. Log into [email or web address] and use [place]'s universal password to access. If you do not have the password, please contact the hospital operator. Locate the [facility] provider you are looking for under [facility] and page to Pitts number that you can be directly reached. If you still have difficulty reaching the provider, please page the [facility] (Director on Call) for the Hospitalists listed on amion for assistance.

## 2020-10-09 NOTE — Brief Op Note (Signed)
10/07/2020 - 10/09/2020  5:25 PM  PATIENT:  Kyle Pitts  60 y.o. male  PRE-OPERATIVE DIAGNOSIS:   1.  RIGHT LEG INFECTION 2.  Right knee septic arthritis  POST-OPERATIVE DIAGNOSIS:   Same  PROCEDURE:   1.  Right knee arthroscopic I&D for septic arthritis 2.  Right knee arthroscopic major synovectomy 3.  Right leg anterior and lateral compartment fasciotomy 4.  Right lower leg irrigation debridement of skin, subcutaneous tissue, muscle, and fascia for infection.  SURGEON:  Surgeon(s) and Role:    * Sarenity Ramaker, Noah Delaine, MD - Primary  PHYSICIAN ASSISTANT: Dion Saucier, PA-C  ANESTHESIA:   general  EBL:  50 mL   BLOOD ADMINISTERED:none  DRAINS: (1) Jackson-Pratt drain(s) with closed bulb suction in the right knee    LOCAL MEDICATIONS USED:  NONE  SPECIMEN:  No Specimen  DISPOSITION OF SPECIMEN:  N/A  COUNTS:  YES  TOURNIQUET:  * No tourniquets in log *  DICTATION: .Note written in EPIC  PLAN OF CARE: Admit to inpatient   PATIENT DISPOSITION:  PACU - hemodynamically stable.   Delay start of Pharmacological VTE agent (>24hrs) due to surgical blood loss or risk of bleeding: not applicable

## 2020-10-09 NOTE — Op Note (Signed)
Date of Surgery: 10/09/2020  INDICATIONS: Kyle Pitts is a 60 y.o.-year-old male with a right septic knee that is currently recalcitrant to initial arthroscopic irrigation debridement by my partner Dr. Shon Baton.  The index debridement was last week.  He presented back to the office this week for postoperative follow-up.  He was noted to have increasing redness and swelling of the entire leg from the thigh to the ankle.  Concern was initially for DVT versus spreading infection.  The DVT was ruled out.  He then had MRIs that showed findings consistent with expansive cellulitis and possible anterior compartment abscess of the lower leg as well as residual knee fluid collection concerning for residual infectious process.  He was indicated for urgent debridement of the knee as well as potentially lower leg.;  The Patient did consent to the procedure after discussion of the risks and benefits.  PATIENT:  Kyle Pitts  60 y.o. male   PRE-OPERATIVE DIAGNOSIS:   1.  RIGHT LEG INFECTION 2.  Right knee septic arthritis   POST-OPERATIVE DIAGNOSIS:   Same   PROCEDURE:   1.  Right knee arthroscopic I&D for septic arthritis 2.  Right knee arthroscopic major synovectomy 3.  Right leg anterior and lateral compartment fasciotomy 4.  Right lower leg irrigation debridement of skin, subcutaneous tissue, muscle, and fascia for infection.  SURGEON: Maryan Rued, M.D.  ASSIST: Dion Saucier, PA-C  Assistant attestation: PA Mcclung was utilized out the procedure for prepping and draping, execution of the arthroscopic and open procedure as well as closure of wounds and applications of dressing..  ANESTHESIA:  general  IV FLUIDS AND URINE: See anesthesia.  ESTIMATED BLOOD LOSS: 10 mL.  IMPLANTS: none  DRAINS: 1 JP drain in the right knee  COMPLICATIONS: None.  DESCRIPTION OF PROCEDURE: The patient was brought to the operating room and placed supine on the operating table.  The patient had been signed  prior to the procedure and this was documented. The patient had the anesthesia placed by the anesthesiologist.  A time-out was performed to confirm that this was the correct patient, site, side and location. The patient did receive antibiotics prior to the incision and was re-dosed during the procedure as needed at indicated intervals.  A tourniquet was not placed.  The patient had the operative extremity prepped and draped in the standard surgical fashion.     Debridement type: Excisional Debridement  Side: right  Body Location: knee and lower leg   Tools used for debridement: scalpel, scissors, and arthroscopic shaver  Pre-debridement Wound size (cm):   Length: n/a        Width: n/a     Depth: n/a   Post-debridement Wound size (cm):   Length: 12 cm Width: 5cm   Depth: 2 cm  Debridement depth beyond dead/damaged tissue down to healthy viable tissue: yes  Tissue layer involved: skin, subcutaneous tissue, muscle / fascia  Nature of tissue removed: Slough  Irrigation volume: 9 L      Irrigation fluid type: Normal Saline   We began the procedure with the arthroscopic portion.  We established a inferior lateral viewing portal just adjacent to the patella tendon at the inferior pole of the patella.  We then did via direct spinal needle localization established an anterior medial working portal.  Initially upon establishing the trocar to enter the knee we encountered about 30 cc of gross purulence.  We then began a major synovectomy utilizing the motorized shaver and the anterior, medial, and lateral  compartments.  We then switched portal orientation to complete the synovectomy on the lateral aspect.  We did also resect the ACL which had very to generative appearance.  Moderate arthritis noted in all 3 compartments.  Lastly, we passed a Jackson-Pratt drain from the anterior portals through a percutaneously placed superior lateral portal.  This was not sutured in place but stab incision was closed  around the drain.  6 L of normal saline was lavaged to the joint.  Arthroscopic portals were closed with nylon suture.  Next, we turned our attention to the open portion.  Using the preoperative MRI we targeted the anterior muscle compartment as the primary source of potential collecting fluid.  We elected to proceed with a 2 compartment fasciotomy with a lateral approach.  We established a 12 cm incision along the intermuscular septum.  We then dissected down through skin and subcutaneous tissue to the muscle fascia.  We encountered significant serous and clear appearing fluid at this level consistent with cellulitis.  We then performed a longitudinal fasciotomy in the anterior compartment and then similar fasciotomy in the lateral compartment.  We encountered healthy appearing muscle.  We bluntly dissected down to bone in both compartments and did not encounter any fluid collections.  We then copiously irrigated this with normal saline.  Fasciotomies were left open.  We then closed the skin with 2-0 Monocryl and 3-0 interrupted nylon.  The leg was cleaned and dried 1 final time standard sterile dressing was applied.  He was awakened from general anesthetic in stable condition.  All counts were correct x2.  He was transported to PACU.  POSTOPERATIVE PLAN:  Kyle Pitts will be weightbearing as tolerated and can range the knee as tolerated immediately following surgery.  We will maintain postoperative bandages for 3 to 4 days.  The Jackson-Pratt drain will be maintained for at least 48 hours.  He will return to the medicine service for IV antibiotics and close follow-up of his response clinically and with lab data over the next few days.

## 2020-10-09 NOTE — H&P (Signed)
H&P update  The surgical history has been reviewed and remains accurate without interval change.  The patient was re-examined and patient's physiologic condition has not changed significantly in the last 30 days. The condition still exists that makes this procedure necessary. The treatment plan remains the same, without new options for care.  No new pharmacological allergies or types of therapy has been initiated that would change the plan or the appropriateness of the plan.  The patient and/or family understand the potential benefits and risks.  Chynna Buerkle P. [name], MD 10/09/2020 2:24 PM

## 2020-10-09 NOTE — Transfer of Care (Signed)
Immediate Anesthesia Transfer of Care Note  Patient: Kyle Pitts  Procedure(s) Performed: ARTHROSCOPY KNEE,ARTHROSCOPIC I & D, CALF ABSCESS (Right: Knee) IRRIGATION AND DEBRIDEMENT EXTREMITY (Right)  Patient Location: PACU  Anesthesia Type:General  Level of Consciousness: awake and patient cooperative  Airway & Oxygen Therapy: Patient Spontanous Breathing and Patient connected to face mask oxygen  Post-op Assessment: Report given to RN and Patient moving all extremities X 4  Post vital signs: stable  Last Vitals:  Vitals Value Taken Time  BP 155/116 10/09/20 1757  Temp    Pulse 68 10/09/20 1759  Resp    SpO2 98 % 10/09/20 1759  Vitals shown include unvalidated device data.  Last Pain:  Vitals:   10/09/20 1302  TempSrc:   PainSc: 0-No pain      Patients Stated Pain Goal: 2 (10/09/20 1255)  Complications: No notable events documented.

## 2020-10-09 NOTE — Anesthesia Postprocedure Evaluation (Signed)
Anesthesia Post Note  Patient: Kyle Pitts  Procedure(s) Performed: ARTHROSCOPY KNEE,ARTHROSCOPIC I & D, CALF ABSCESS (Right: Knee) IRRIGATION AND DEBRIDEMENT EXTREMITY (Right)     Patient location during evaluation: PACU Anesthesia Type: General Level of consciousness: awake and alert Pain management: pain level controlled Vital Signs Assessment: post-procedure vital signs reviewed and stable Respiratory status: spontaneous breathing, nonlabored ventilation and respiratory function stable Cardiovascular status: blood pressure returned to baseline and stable Postop Assessment: no apparent nausea or vomiting Anesthetic complications: no   No notable events documented.  Last Vitals:  Vitals:   10/09/20 1925 10/09/20 1930  BP: (!) 163/95 (!) 139/91  Pulse: 77 80  Resp: 10 11  Temp:    SpO2: 100% 98%    Last Pain:  Vitals:   10/09/20 1908  TempSrc:   PainSc: 7                  Lowella Curb

## 2020-10-09 NOTE — Anesthesia Procedure Notes (Signed)
Procedure Name: LMA Insertion Date/Time: 10/09/2020 4:28 PM Performed by: Illene Silver, CRNA Pre-anesthesia Checklist: Patient identified, Emergency Drugs available, Suction available and Patient being monitored Patient Re-evaluated:Patient Re-evaluated prior to induction Oxygen Delivery Method: Circle system utilized Preoxygenation: Pre-oxygenation with 100% oxygen Induction Type: IV induction LMA: LMA with gastric port inserted LMA Size: 5.0 Tube type: Oral Number of attempts: 1 Airway Equipment and Method: Oral airway Placement Confirmation: positive ETCO2 Tube secured with: Tape Dental Injury: Teeth and Oropharynx as per pre-operative assessment

## 2020-10-09 NOTE — Anesthesia Preprocedure Evaluation (Addendum)
Anesthesia Evaluation  Patient identified by MRN, date of birth, ID band Patient awake    Reviewed: Allergy & Precautions, NPO status , Patient's Chart, lab work & pertinent test results  History of Anesthesia Complications Negative for: history of anesthetic complications  Airway Mallampati: II  TM Distance: >3 FB Neck ROM: Full    Dental  (+) Dental Advisory Given   Pulmonary asthma ,    Pulmonary exam normal        Cardiovascular negative cardio ROS Normal cardiovascular exam     Neuro/Psych negative neurological ROS  negative psych ROS   GI/Hepatic negative GI ROS, Neg liver ROS,   Endo/Other   Na 129 (chronically low) Ca 8.7 Cl 97   Renal/GU negative Renal ROS     Musculoskeletal  (+) Arthritis ,   Abdominal   Peds  Hematology  (+) anemia ,   Anesthesia Other Findings   Reproductive/Obstetrics                            Anesthesia Physical Anesthesia Plan  ASA: 3  Anesthesia Plan: General   Post-op Pain Management:    Induction: Intravenous  PONV Risk Score and Plan: 2 and Treatment may vary due to age or medical condition, Ondansetron and Midazolam  Airway Management Planned: Oral ETT  Additional Equipment: None  Intra-op Plan:   Post-operative Plan: Extubation in OR  Informed Consent: I have reviewed the patients History and Physical, chart, labs and discussed the procedure including the risks, benefits and alternatives for the proposed anesthesia with the patient or authorized representative who has indicated his/her understanding and acceptance.     Dental advisory given  Plan Discussed with: CRNA and Anesthesiologist  Anesthesia Plan Comments:        Anesthesia Quick Evaluation

## 2020-10-09 NOTE — Discharge Instructions (Addendum)
Ortho dc instructions: Post-operative patient instructions  Knee Arthroscopy   You have an appointment on 10/18/20 at 10:45 at [facility] - please come to this appointment that we previously set up.   Ice:  Place intermittent ice or cooler pack over your knee, 30 minutes on and 30 minutes off.  Continue this for the first 72 hours after surgery, then save ice for use after therapy sessions or on more active days.   Weight:  You may bear weight on your leg as your symptoms allow. DVT prevention: Perform ankle pumps as able throughout the day on the operative extremity.  Be mobile as possible with ambulation as able.  You should also take an 81 mg aspirin once per day x6 weeks. Crutches:  Use crutches (or walker) to assist in walking until told to discontinue by your physical therapist or physician. This will help to reduce pain. Strengthening:  Perform simple thigh squeezes (isometric quad contractions) and straight leg lifts as you are able (3 sets of 5 to 10 repetitions, 3 times a day).  For the leg lifts, have someone support under your ankle in the beginning until you have increased strength enough to do this on your own.  To help get started on thigh squeezes, place a pillow under your knee and push down on the pillow with back of knee (sometimes easier to do than with your leg fully straight). Motion:  Perform gentle knee motion as tolerated - this is gentle bending and straightening of the knee. Seated heel slides: you can start by sitting in a chair, remove your brace, and gently slide your heel back on the floor - allowing your knee to bend. Have someone help you straighten your knee (or use your other leg/foot hooked under your ankle.  Dressing:  Perform 1st dressing change at 3 days postoperative. A moderate amount of blood tinged drainage is to be expected.  So if you bleed through the dressing on the first or second day or if you have fevers, it is fine to change the dressing/check the wounds  early and redress wound. Elevate your leg.  If it bleeds through again, or if the incisions are leaking frank blood, please call the office. May change dressing every 1-2 days thereafter to help watch wounds. Can purchase Tegderm (or 70M Nexcare) water resistant dressings at local pharmacy / [employer]. Shower:  Light shower is ok after 7 days.  Please take shower, NO bath. Recover with gauze and ace wrap to help keep wounds protected.   Pain medication:  A narcotic pain medication has been prescribed.  Take as directed.  Typically you need narcotic pain medication more regularly during the first 3 to 5 days after surgery.  Decrease your use of the medication as the pain improves.  Narcotics can sometimes cause constipation, even after a few doses.  If you have problems with constipation, you can take an over the counter stool softener or light laxative.  If you have persistent problems, please notify your physician's office. Physical therapy: Additional activity guidelines to be provided by your physician or physical therapist at follow-up visits.  Driving: Do not recommend driving x 1-2 weeks post surgical, especially if surgery performed on right side. Should not drive while taking narcotic pain medications. It typically takes at least 2 weeks to restore sufficient neuromuscular function for normal reaction times for driving safety.  Call [phone] for questions or problems. Evenings you will be forwarded to the hospital operator.  Ask for the orthopaedic  physician on call. Please call if you experience:    Redness, foul smelling, or persistent drainage from the surgical site  worsening knee pain and swelling not responsive to medication  any calf pain and or swelling of the lower leg  temperatures greater than 101.5 F other questions or concerns   Thank you for allowing [place] to be a part of your care

## 2020-10-10 ENCOUNTER — Encounter (HOSPITAL_COMMUNITY): Payer: Self-pay | Admitting: Orthopedic Surgery

## 2020-10-10 LAB — COMPREHENSIVE METABOLIC PANEL
ALT: 32 U/L (ref 0–44)
AST: 48 U/L — ABNORMAL HIGH (ref 15–41)
Albumin: 1.7 g/dL — ABNORMAL LOW (ref 3.5–5.0)
Alkaline Phosphatase: 253 U/L — ABNORMAL HIGH (ref 38–126)
Anion gap: 2 — ABNORMAL LOW (ref 5–15)
BUN: 17 mg/dL (ref 6–20)
CO2: 26 mmol/L (ref 22–32)
Calcium: 8.4 mg/dL — ABNORMAL LOW (ref 8.9–10.3)
Chloride: 100 mmol/L (ref 98–111)
Creatinine, Ser: 0.65 mg/dL (ref 0.61–1.24)
GFR, Estimated: >60 mL/min (ref >60)
Glucose, Bld: 123 mg/dL — ABNORMAL HIGH (ref 70–99)
Potassium: 4.1 mmol/L (ref 3.5–5.1)
Sodium: 128 mmol/L — ABNORMAL LOW (ref 135–145)
Total Bilirubin: 3.1 mg/dL — ABNORMAL HIGH (ref 0.3–1.2)
Total Protein: 6.4 g/dL — ABNORMAL LOW (ref 6.5–8.1)

## 2020-10-10 LAB — CBC WITH DIFFERENTIAL/PLATELET
Abs Immature Granulocytes: 0.15 10*3/uL — ABNORMAL HIGH (ref 0.00–0.07)
Basophils Absolute: 0.1 10*3/uL (ref 0.0–0.1)
Basophils Relative: 0 %
Eosinophils Absolute: 0 10*3/uL (ref 0.0–0.5)
Eosinophils Relative: 0 %
HCT: 23.7 % — ABNORMAL LOW (ref 39.0–52.0)
Hemoglobin: 7.7 g/dL — ABNORMAL LOW (ref 13.0–17.0)
Immature Granulocytes: 1 %
Lymphocytes Relative: 17 %
Lymphs Abs: 1.9 10*3/uL (ref 0.7–4.0)
MCH: 34.5 pg — ABNORMAL HIGH (ref 26.0–34.0)
MCHC: 32.5 g/dL (ref 30.0–36.0)
MCV: 106.3 fL — ABNORMAL HIGH (ref 80.0–100.0)
Monocytes Absolute: 1.7 10*3/uL — ABNORMAL HIGH (ref 0.1–1.0)
Monocytes Relative: 15 %
Neutro Abs: 7.7 10*3/uL (ref 1.7–7.7)
Neutrophils Relative %: 67 %
Platelets: 212 10*3/uL (ref 150–400)
RBC: 2.23 MIL/uL — ABNORMAL LOW (ref 4.22–5.81)
RDW: 14.4 % (ref 11.5–15.5)
WBC: 11.5 10*3/uL — ABNORMAL HIGH (ref 4.0–10.5)
nRBC: 0 % (ref 0.0–0.2)

## 2020-10-10 NOTE — Progress Notes (Signed)
Pharmacy Antibiotic Note  Kyle Pitts is a 60 y.o. male admitted on 10/07/2020 with cellulitis, septic arthritis.  Pharmacy has been consulted for vancomycin, cefepime dosing.  PMH significant for right TKA, washout performed 6/10 for septic arthritis was recently discharged from the hospital on ceftriaxone IV for strep pyogenes septic arthritis.   Today, 10/10/20 WBC slightly elevated SCr WNL, stable.  CrCl > 60 mL/min Afebrile  Plan: Continue cefepime 2 g IV q8h Vancomycin 1250 mg IV q12h Goal vancomycin AUC 400-550 Follow renal function, culture results  Height: 6' (182.9 cm) Weight: 99.8 kg (220 lb) IBW/kg (Calculated) : 77.6  Temp (24hrs), Avg:98.2 F (36.8 C), Min:97.6 F (36.4 C), Max:98.6 F (37 C)  Recent Labs  Lab 10/07/20 1838 10/07/20 1848 10/08/20 0130 10/08/20 0539 10/09/20 0336 10/10/20 0315  WBC 15.6*  --  14.0* 17.5* 11.1* 11.5*  CREATININE 0.95  --  0.85 0.76 0.58* 0.65  LATICACIDVEN  --  0.9 1.0  --   --   --     Estimated Creatinine Clearance: 123.1 mL/min (by C-G formula based on SCr of 0.65 mg/dL).    Allergies  Allergen Reactions   Cephalexin Nausea Only    Upset stomach    Antimicrobials this admission: cefepime 6/20 >>  vancomycin 6/20 >>   Dose adjustments this admission:  Microbiology results: 6/20 BCx: ngtd  Thank you for allowing pharmacy to be a part of this patient's care.  Cindi Carbon, PharmD 10/10/2020 10:43 AM

## 2020-10-10 NOTE — Progress Notes (Signed)
None PROGRESS NOTE    Kyle Pitts  ZOX:[id number] DOB: 08-22-1960 DOA: 10/07/2020 PCP: [name], MD   Brief Narrative:  Kyle Pitts is a 60 y.o. 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  Patient presented to the ER with worsening swelling of the right lower extremity from the thigh all the way down.  Associated with pain redness and some subjective fever.  On arrival in the ER he appears to have significant cellulitis involving most of the right lower extremity.  He was hemodynamically stable. White count 14.0, Sodium 123 potassium.  Patient was admitted to hospitalist rest, orthopedics consulted.  Further hospital course as below.  Assessment & Plan:   Principal Problem:   Cellulitis and abscess of right leg Active Problems:   Septic arthritis of knee (HCC)   Hyponatremia   Leucocytosis     #1 cellulitis of the right lower extremity S/p arthroscopic incision and drainage for septic right knee 09/27/2020 during recent hospitalization admitted with right lower extremity cellulitis/myofascial disc of thigh and lower leg/abscess anterior compartment of lower leg/moderate to large joint effusion of right knee with septic arthritis:  Failed IV Rocephin.  Started on vancomycin and cefepime.  Ortho consulted.  They obtained MRI right lower extremity which shows extensive severe cellulitis and mild fasciitis involving anterior compartment and posterior compartment as well as lower leg with possible intramuscular abscess in anterior compartment in the lower leg.  Moderate to large sized joint effusion on the right knee with possible septic arthritis and osteomyelitis.  Patient's erythema/cellulitis had improved somewhat yesterday.  He  underwent following procedures by Ortho on 10/09/2020. 1.  Right knee arthroscopic I&D for septic arthritis 2.  Right knee arthroscopic major synovectomy 3.  Right leg anterior and lateral compartment fasciotomy 4.  Right lower leg irrigation debridement of skin, subcutaneous tissue, muscle, and fascia for infection. Complains of pain today.  Further management deferred to Ortho.  Continue current antibiotics until cultures are back.  # 2.  Elevated bilirubin and alkaline phosphatase: Improving.  Monitor.  #3  Chronic hyponatremia: Stable at his baseline.  #4.  Acute blood loss anemia: Baseline hemoglobin around 11.  Hemoglobin dropped to 7.7 today since he had procedure yesterday.  Monitor closely and transfuse if drops less than 70   DVT prophylaxis: enoxaparin (LOVENOX) injection 40 mg Start: 10/10/20 0800 SCDs Start: 10/09/20 2012   Code Status: Full Code  Family Communication:  None present at bedside.  Plan of care discussed with patient in length and he verbalized understanding and agreed with it.  Status is: Inpatient  Remains inpatient appropriate because:IV treatments appropriate due to intensity of illness or inability to take PO  Dispo: The patient is from: Home              Anticipated d/c is to: Home              Patient currently is not medically stable to d/c.   Difficult to place patient No        Estimated body mass index is 29.84 kg/m as calculated from the following:   Height as of this encounter: 6' (1.829 m).   Weight as of this encounter: 99.8 kg.      Nutritional status:  Consultants:  Orthopedics  Procedures:  1.  Right knee arthroscopic I&D for septic arthritis 2.  Right knee arthroscopic major synovectomy 3.  Right leg anterior and lateral compartment fasciotomy 4.  Right lower leg irrigation debridement of skin, subcutaneous tissue, muscle, and fascia for infection.  Antimicrobials:  Anti-infectives (From  admission, onward)    Start     Dose/Rate Route Frequency Ordered Stop   10/08/20 1000  vancomycin (VANCOREADY) IVPB 1250 mg/250 mL        1,250 mg 166.7 mL/hr over 90 Minutes Intravenous Every 12 hours 10/07/20 2127     10/08/20 0800  vancomycin (VANCOREADY) IVPB 1250 mg/250 mL  Status:  Discontinued        1,250 mg 166.7 mL/hr over 90 Minutes Intravenous Every 12 hours 10/07/20 2018 10/07/20 2127   10/08/20 0600  ceFEPIme (MAXIPIME) 2 g in sodium chloride 0.9 % 100 mL IVPB        2 g 200 mL/hr over 30 Minutes Intravenous Every 8 hours 10/07/20 2018     10/07/20 2130  vancomycin (VANCOREADY) IVPB 2000 mg/400 mL        2,000 mg 200 mL/hr over 120 Minutes Intravenous  Once 10/07/20 2127 10/08/20 0024   10/07/20 2030  vancomycin (VANCOCIN) IVPB 1000 mg/200 mL premix  Status:  Discontinued        1,000 mg 200 mL/hr over 60 Minutes Intravenous  Once 10/07/20 2018 10/07/20 2127   10/07/20 2030  ceFEPIme (MAXIPIME) 2 g in sodium chloride 0.9 % 100 mL IVPB        2 g 200 mL/hr over 30 Minutes Intravenous  Once 10/07/20 2018 10/07/20 2123   10/07/20 2015  cefTRIAXone (ROCEPHIN) 1 g in sodium chloride 0.9 % 100 mL IVPB  Status:  Discontinued        1 g 200 mL/hr over 30 Minutes Intravenous  Once 10/07/20 2003 10/07/20 2018   10/07/20 1945  cefTRIAXone (ROCEPHIN) 2 g in sodium chloride 0.9 % 100 mL IVPB  Status:  Discontinued        2 g 200 mL/hr over 30 Minutes Intravenous  Once 10/07/20 1942 10/07/20 2003   10/07/20 1915  cefTRIAXone (ROCEPHIN) 1 g in sodium chloride 0.9 % 100 mL IVPB  Status:  Discontinued        1 g 200 mL/hr over 30 Minutes Intravenous  Once 10/07/20 1902 10/07/20 2019   10/07/20 1915  vancomycin (VANCOCIN) IVPB 1000 mg/200 mL premix  Status:  Discontinued        1,000 mg 200 mL/hr over 60 Minutes Intravenous  Once 10/07/20 1902 10/07/20 2127          Subjective: Patient seen and examined, very motivated.  Complains of right leg pain.  No other  complaint.  Objective: Vitals:   10/09/20 2312 10/10/20 0137 10/10/20 0506 10/10/20 1007  BP: 132/80 112/76 133/79 132/74  Pulse: 96 88 80 86  Resp: Temp: 98.6 F (37 C) 98.1 F (36.7 C) 98.3 F (36.8 C) 97.9 F (36.6 C)  TempSrc: Oral Oral Oral Oral  SpO2: 96% 99% 100% 97%  Weight:      Height:        Intake/Output Summary (Last 24 hours) at 10/10/2020 1014 Last data filed at 10/10/2020 1010 Gross per 24 hour  Intake 3900.01 ml  Output 1932 ml  Net 1968.01 ml    Filed Weights   10/07/20 1716 10/09/20 1302  Weight: 99.8 kg 99.8 kg  Examination:  General exam: Appears calm and comfortable  Respiratory system: Clear to auscultation. Respiratory effort normal. Cardiovascular system: S1 & S2 heard, RRR. No JVD, murmurs, rubs, gallops or clicks.  +3 pitting edema right ankle. Gastrointestinal system: Abdomen is nondistended, soft and nontender. No organomegaly or masses felt. Normal bowel sounds heard. Central nervous system: Alert and oriented. No focal neurological deficits. Extremities: Has large dressing on the right lower extremity, direct visualization defer to orthopedics. Skin: No rashes, lesions or ulcers.  Psychiatry: Judgement and insight appear normal. Mood & affect appropriate.         Data Reviewed: I have personally reviewed following labs and imaging studies  CBC: Recent Labs  Lab 10/07/20 1838 10/08/20 0130 10/08/20 0539 10/09/20 0336 10/10/20 0315  WBC 15.6* 14.0* 17.5* 11.1* 11.5*  NEUTROABS 13.0*  --   --  7.2 7.7  HGB 11.0* 11.3* 11.3* 9.8* 7.7*  HCT 31.0* 32.3* 31.6* 27.9* 23.7*  MCV 100.3* 100.9* 100.0 100.7* 106.3*  PLT 181 195 206 206 212    Basic Metabolic Panel: Recent Labs  Lab 10/07/20 1838 10/08/20 0130 10/08/20 0539 10/09/20 0336 10/10/20 0315  NA 123*  --  128* 129* 128*  K 4.1  --  4.3 4.0 4.1  CL 89*  --  95* 97* 100  CO2 28  --  28 27 26   GLUCOSE 79  --  86 88 123*  BUN 25*  --  16 15 17    CREATININE 0.95 0.85 0.76 0.58* 0.65  CALCIUM 8.5*  --  8.8* 8.7* 8.4*    GFR: Estimated Creatinine Clearance: 123.1 mL/min (by C-G formula based on SCr of 0.65 mg/dL). Liver Function Tests: Recent Labs  Lab 10/08/20 0539 10/10/20 0315  AST 59* 48*  ALT 43 32  ALKPHOS 341* 253*  BILITOT 4.4* 3.1*  PROT 7.1 6.4*  ALBUMIN 2.2* 1.7*    No results for input(s): LIPASE, AMYLASE in the last 168 hours. No results for input(s): AMMONIA in the last 168 hours. Coagulation Profile: No results for input(s): INR, PROTIME in the last 168 hours. Cardiac Enzymes: No results for input(s): CKTOTAL, CKMB, CKMBINDEX, TROPONINI in the last 168 hours. BNP (last 3 results) No results for input(s): PROBNP in the last 8760 hours. HbA1C: No results for input(s): HGBA1C in the last 72 hours. CBG: No results for input(s): GLUCAP in the last 168 hours. Lipid Profile: No results for input(s): CHOL, HDL, LDLCALC, TRIG, CHOLHDL, LDLDIRECT in the last 72 hours. Thyroid Function Tests: No results for input(s): TSH, T4TOTAL, FREET4, T3FREE, THYROIDAB in the last 72 hours. Anemia Panel: No results for input(s): VITAMINB12, FOLATE, FERRITIN, TIBC, IRON, RETICCTPCT in the last 72 hours. Sepsis Labs: Recent Labs  Lab 10/07/20 1848 10/08/20 0130  LATICACIDVEN 0.9 1.0     Recent Results (from the past 240 hour(s))  Culture, blood (routine x 2)     Status: None (Preliminary result)   Collection Time: 10/07/20  6:25 PM   Specimen: BLOOD LEFT FOREARM  Result Value Ref Range Status   Specimen Description   Final    BLOOD LEFT FOREARM Performed at [facility], [address] [place], [place]    Special Requests   Final    BOTTLES DRAWN AEROBIC ONLY Blood Culture results may not be optimal due to an inadequate volume of blood received in culture bottles Performed at [facility], [address], [place], [place]    Culture   Final  NO  GROWTH 2 DAYS Performed at [facility], [address] [place], [place] [address]    Report Status PENDING  Incomplete  Culture, blood (routine x 2)     Status: None (Preliminary result)   Collection Time: 10/07/20  6:35 PM   Specimen: Site Not Specified; Blood  Result Value Ref Range Status   Specimen Description   Final    SITE NOT SPECIFIED Performed at [facility], [address] [place], [place] [address]    Special Requests   Final    BOTTLES DRAWN AEROBIC AND ANAEROBIC Blood Culture adequate volume Performed at [facility], [address] [place], [place] [address]    Culture   Final    NO GROWTH 2 DAYS Performed at [facility], [address] [place], [place] [address]    Report Status PENDING  Incomplete  Resp Panel by RT-PCR (Flu A&B, Covid) Nasopharyngeal Swab     Status: None   Collection Time: 10/07/20  6:38 PM   Specimen: Nasopharyngeal Swab; Nasopharyngeal(NP) swabs in vial transport medium  Result Value Ref Range Status   SARS Coronavirus 2 by RT PCR NEGATIVE NEGATIVE Final    Comment: (NOTE) SARS-CoV-2 target nucleic acids are NOT DETECTED.  The SARS-CoV-2 RNA is generally detectable in upper respiratory specimens during the acute phase of infection. The lowest concentration of SARS-CoV-2 viral copies this assay can detect is 138 copies/mL. A negative result does not preclude SARS-Cov-2 infection and should not be used as the sole basis for treatment or other patient management decisions. A negative result may occur with  improper specimen collection/handling, submission of specimen other than nasopharyngeal swab, presence of viral mutation(s) within the areas targeted by this assay, and inadequate number of viral copies(<138 copies/mL). A negative result must be combined with clinical observations, patient history, and epidemiological information. The expected result is Negative.  Fact Sheet for  Patients:  BloggerCourse.comhttps://www.fda.gov/media/152166/download  Fact Sheet for Healthcare Providers:  SeriousBroker.ithttps://www.fda.gov/media/152162/download  This test is no t yet approved or cleared by the [place] FDA and  has been authorized for detection and/or diagnosis of SARS-CoV-2 by FDA under an Emergency Use Authorization (EUA). This EUA will remain  in effect (meaning this test can be used) for the duration of the COVID-19 declaration under Section 564(b)(1) of the Act, 21 U.S.C.section 360bbb-3(b)(1), unless the authorization is terminated  or revoked sooner.       Influenza A by PCR NEGATIVE NEGATIVE Final   Influenza B by PCR NEGATIVE NEGATIVE Final    Comment: (NOTE) The Xpert Xpress SARS-CoV-2/FLU/RSV plus assay is intended as an aid in the diagnosis of influenza from Nasopharyngeal swab specimens and should not be used as a sole basis for treatment. Nasal washings and aspirates are unacceptable for Xpert Xpress SARS-CoV-2/FLU/RSV testing.  Fact Sheet for Patients: BloggerCourse.comhttps://www.fda.gov/media/152166/download  Fact Sheet for Healthcare Providers: SeriousBroker.ithttps://www.fda.gov/media/152162/download  This test is not yet approved or cleared by the [place] FDA and has been authorized for detection and/or diagnosis of SARS-CoV-2 by FDA under an Emergency Use Authorization (EUA). This EUA will remain in effect (meaning this test can be used) for the duration of the COVID-19 declaration under Section 564(b)(1) of the Act, 21 U.S.C. section 360bbb-3(b)(1), unless the authorization is terminated or revoked.  Performed at [facility], [address] [place], [place] [address]   Surgical pcr screen     Status: None   Collection Time: 10/09/20  5:39 AM  Specimen: Nasal Mucosa; Nasal Swab  Result Value Ref Range Status   MRSA, PCR NEGATIVE NEGATIVE Final   Staphylococcus aureus NEGATIVE NEGATIVE Final    Comment: (NOTE) The Xpert SA Assay (FDA approved for NASAL  specimens in patients 77 years of age and older), is one component of a comprehensive surveillance program. It is not intended to diagnose infection nor to guide or monitor treatment. Performed at [facility], [address], [place], [place] [address]        Radiology Studies: MR TIBIA FIBULA RIGHT WO CONTRAST  Result Date: 10/08/2020 CLINICAL DATA:  Soft tissue infection suspected, thigh, xray done. Recent admission for septic arthritis of the right knee, went to OR for washout on June 10th. EXAM: MRI OF THE RIGHT FEMUR WITHOUT CONTRAST MRI OF THE RIGHT LOWER EXTREMITY (TIBIA AND FIBULA) WITHOUT CONTRAST TECHNIQUE: Multiplanar, multisequence MR imaging of the right femur was performed. No intravenous contrast was administered. Multiplanar, multisequence MR imaging of the right lower extremity (tibia and fibula) was performed. No intravenous contrast was administered. COMPARISON:  Radiograph 09/27/2020 FINDINGS: Image degradation due to motion, particularly at the knee. Bones/Joint/Cartilage The cortex is intact. There is increased periarticular STIR signal in the knee, predominantly in the medial femoral condyle and medial tibial plateau. There is medial predominant arthritis and likely degenerative medial meniscal tearing. There is a partially visualized joint effusion of the left knee, likely moderate to large size (coronal stir image 34). There is fluid collecting posterior medially at the knee, likely within a large baker cyst (axial T2 image 8) and along the popliteus (axial T2 image 12). Ligaments Grossly intact. Muscles and Tendons Upper leg/thigh: There is extensive antrum muscular edema throughout the anterior compartment of the thigh, with some inter fascial edema. There is also inter fascial edema and thickening posteriorly. There is no focal fluid collection. Lower leg: There is intramuscular edema within the anterior compartment of the lower leg, with central 1.5 x  0.6 cm collection (axial T2 fat-sat image 18). There is inter fascial edema and thickening (axial T2 fat-sat image 29). There is fluid collecting along the anterior aponeurosis of the medial head gastrocnemius muscle in the lower leg (axial T2 fat-sat image 27). Soft tissues There is extensive subcutaneous soft tissue swelling of the thigh and lower leg. IMPRESSION: Extensive soft tissue swelling of the thigh and lower leg as can be seen with severe cellulitis. Myofasciitis in the thigh involving the anterior compartment and to a lesser degree the posterior compartment. Myofasciitis in the lower leg involving the anterior and posterior compartments as described above, with possible 1.5 x 0.6 cm intramuscular collection/developing abscess in the anterior compartment. Partially visualized, moderate to large sized joint effusion of the right knee, with fluid collecting posteriorly, likely within a large Baker cyst and along the popliteus. Periarticular marrow signal changes predominantly in the medial femoral condyle and tibial plateau concerning for septic arthritis with osteomyelitis or severe degenerative arthritis. Note there is marked image degradation at the level of the knee. Electronically Signed   By: Caprice Renshaw   On: 10/08/2020 15:16   MR FRMUR RIGHT WO CONTRAST  Result Date: 10/08/2020 CLINICAL DATA:  Soft tissue infection suspected, thigh, xray done. Recent admission for septic arthritis of the right knee, went to OR for washout on June 10th. EXAM: MRI OF THE RIGHT FEMUR WITHOUT CONTRAST MRI OF THE RIGHT LOWER EXTREMITY (TIBIA AND FIBULA) WITHOUT CONTRAST TECHNIQUE: Multiplanar, multisequence MR imaging of the right femur was performed. No  intravenous contrast was administered. Multiplanar, multisequence MR imaging of the right lower extremity (tibia and fibula) was performed. No intravenous contrast was administered. COMPARISON:  Radiograph 09/27/2020 FINDINGS: Image degradation due to motion,  particularly at the knee. Bones/Joint/Cartilage The cortex is intact. There is increased periarticular STIR signal in the knee, predominantly in the medial femoral condyle and medial tibial plateau. There is medial predominant arthritis and likely degenerative medial meniscal tearing. There is a partially visualized joint effusion of the left knee, likely moderate to large size (coronal stir image 34). There is fluid collecting posterior medially at the knee, likely within a large baker cyst (axial T2 image 8) and along the popliteus (axial T2 image 12). Ligaments Grossly intact. Muscles and Tendons Upper leg/thigh: There is extensive antrum muscular edema throughout the anterior compartment of the thigh, with some inter fascial edema. There is also inter fascial edema and thickening posteriorly. There is no focal fluid collection. Lower leg: There is intramuscular edema within the anterior compartment of the lower leg, with central 1.5 x 0.6 cm collection (axial T2 fat-sat image 18). There is inter fascial edema and thickening (axial T2 fat-sat image 29). There is fluid collecting along the anterior aponeurosis of the medial head gastrocnemius muscle in the lower leg (axial T2 fat-sat image 27). Soft tissues There is extensive subcutaneous soft tissue swelling of the thigh and lower leg. IMPRESSION: Extensive soft tissue swelling of the thigh and lower leg as can be seen with severe cellulitis. Myofasciitis in the thigh involving the anterior compartment and to a lesser degree the posterior compartment. Myofasciitis in the lower leg involving the anterior and posterior compartments as described above, with possible 1.5 x 0.6 cm intramuscular collection/developing abscess in the anterior compartment. Partially visualized, moderate to large sized joint effusion of the right knee, with fluid collecting posteriorly, likely within a large Baker cyst and along the popliteus. Periarticular marrow signal changes  predominantly in the medial femoral condyle and tibial plateau concerning for septic arthritis with osteomyelitis or severe degenerative arthritis. Note there is marked image degradation at the level of the knee. Electronically Signed   By: Caprice Renshaw   On: 10/08/2020 15:16    Scheduled Meds:  celecoxib  100 mg Oral BID   Chlorhexidine Gluconate Cloth  6 each Topical Daily   docusate sodium  100 mg Oral BID   enoxaparin (LOVENOX) injection  40 mg Subcutaneous Q24H   sodium chloride flush  10-40 mL Intracatheter Q12H   Continuous Infusions:  sodium chloride 100 mL/hr at 10/10/20 0859   ceFEPime (MAXIPIME) IV 2 g (10/10/20 0527)   vancomycin 1,250 mg (10/10/20 0906)     LOS: 3 days   Time spent: 29 minutes   Hughie Closs, MD [facility]  10/10/2020, 10:14 AM   How to contact the [facility] Attending or Consulting provider 7A - 7P or covering provider during after hours 7P -7A, for this patient?  Check the care team in [facility] and look for a) attending/consulting [facility] provider listed and b) the [facility] team listed. Page or secure chat 7A-7P. Log into [email or web address] and use [facility] universal password to access. If you do not have the password, please contact the hospital operator. Locate the [facility] provider you are looking for under [facility] and page to a number that you can be directly reached. If you still have difficulty reaching the provider, please page the [facility] (Director on Call) for the Hospitalists listed on amion for assistance.

## 2020-10-10 NOTE — Progress Notes (Signed)
   Subjective:  Kyle Pitts is a 60 y.o. male, 1 Day Post-Op    s/p Procedure(s): ARTHROSCOPY KNEE,ARTHROSCOPIC I & D, CALF ABSCESS IRRIGATION AND DEBRIDEMENT EXTREMITY   Patient reports pain as mild to moderate.  She reports that his pain is improved since yesterday.  Still reports difficulty with weightbearing, decreased range of motion.  Denies fever or chills.  Reports intermittent tingling.  Denies calf pain.  Objective:   VITALS:   Vitals:   10/10/20 0137 10/10/20 0506 10/10/20 1007 10/10/20 1342  BP: 112/76 133/79 132/74 (!) 155/87  Pulse: 88 80 86 80  Resp: $Remo'17 18 18 18  'ChieO$ Temp: 98.1 F (36.7 C) 98.3 F (36.8 C) 97.9 F (36.6 C) 98.3 F (36.8 C)  TempSrc: Oral Oral Oral Oral  SpO2: 99% 100% 97% 97%  Weight:      Height:       Constitutional: General Appearance: healthy-appearing, well-nourished, and well-developed.  In hospital chair Level of Distress: no acute distress. Eyes: Lens (normal) clear: both eyes. Head: Head: normocephalic and atraumatic. Lungs: Respiratory effort: no dyspnea. Skin: Inspection and palpation: no rash, lesions Neurologic: Cranial Nerves: grossly intact. Sensation: grossly intact. Psychiatric: Insight: good judgement and insight. Mental Status: normal mood and affect and active and alert. Orientation: to time, place, and person.  Right Lower Extremity:    Inspection:Significant 3+ edema from the lateral mid thigh down to the foot swelling . Ace wrap bulky bandage C/D/I of the knee/lower leg.  Erythema present on the lateral thigh, distal leg - rest is obscured by bandage.     intact extensor mech No posterior calf tenderness Distal ankle and foot strength normal Sensation is intact to light touch Extremity is warm and well perfuse,cap refill less than 2 seconds.    Lab Results  Component Value Date   WBC 11.5 (H) 10/10/2020   HGB 7.7 (L) 10/10/2020   HCT 23.7 (L) 10/10/2020   MCV 106.3 (H) 10/10/2020   PLT 212 10/10/2020    BMET    Component Value Date/Time   NA 128 (L) 10/10/2020 0315   K 4.1 10/10/2020 0315   CL 100 10/10/2020 0315   CO2 26 10/10/2020 0315   GLUCOSE 123 (H) 10/10/2020 0315   BUN 17 10/10/2020 0315   CREATININE 0.65 10/10/2020 0315   CALCIUM 8.4 (L) 10/10/2020 0315   GFRNONAA >60 10/10/2020 0315   GFRAA  05/13/2010 1718    >60        The eGFR has been calculated using the MDRD equation. This calculation has not been validated in all clinical situations. eGFR's persistently <60 mL/min signify possible Chronic Kidney Disease.     Assessment/Plan: 1 Day Post-Op   Principal Problem:   Cellulitis and abscess of right leg Active Problems:   Septic arthritis of knee (HCC)   Hyponatremia   Leucocytosis   Advance diet Up with therapy Continue antibiotics per primary  Will remove drain tomorrow, plan to keep dressings on for 2-3 more days.   Weightbearing Status: WBAT, ROM as tolerated.  DVT Prophylaxis: Lovenox   Faythe Casa 10/10/2020, 2:53 PM  Jonelle Sidle PA-C  Physician Assistant with Dr. Lillia Abed [place]

## 2020-10-10 NOTE — Evaluation (Signed)
Occupational Therapy Evaluation Patient Details Name: Kyle Pitts MRN: [id number] DOB: June 22, 1960 Today's Date: 10/10/2020    History of Present Illness Kyle Pitts is a 60 y.o. 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  Patient presented to the ER 10/07/20 with worsening swelling of the right lower extremity from the thigh all the way down. S/PARTHROSCOPY KNEE,ARTHROSCOPIC I & D, CALF ABSCESS  IRRIGATION AND DEBRIDEMENT EXTREMITY, fasciotomy anterior compartment on 6/ 22/22   Clinical Impression   [name] is a 60 year old man s/p above procedure with decreased ROM, strength and pain in RLE resulting in a decline in independence. Patient reports dealing with RLE deficits for several weeks and being able to manage at home. On evaluation patient limited by pain and patient only able to stand and ambulate forward and backwards. Overall needs min assist for ADLs. Patient will benefit from skilled OT services while in hospital to improve deficits and learn compensatory strategies as needed in order to return home at modified independence.       Follow Up Recommendations  Home health OT    Equipment Recommendations  Tub/shower seat;Other (comment) [facility])    Recommendations for Other Services       Precautions / Restrictions Precautions Precautions: Fall Precaution Comments: impulsive Restrictions Weight Bearing Restrictions: No RLE Weight Bearing: Weight bearing as tolerated Other Position/Activity Restrictions: per ortho, no restrictions for ROM L knee      Mobility Bed Mobility Overal bed mobility: Needs Assistance Bed Mobility: Supine to Sit     Supine to sit: Min assist     General bed mobility comments: OOb in recliner    Transfers Overall transfer level: Needs  assistance Equipment used: Rolling walker (2 wheeled) Transfers: Sit to/from [employer] Sit to Stand: Min guard Stand pivot transfers: Min guard       General transfer comment: Min guard for standing and ambulating forward and backward with RW. Slightly unsteady going backwards.    Balance Overall balance assessment: Mild deficits observed, not formally tested Sitting-balance support: Bilateral upper extremity supported;Feet supported Sitting balance-Leahy Scale: Fair     Standing balance support: During functional activity;Bilateral upper extremity supported Standing balance-Leahy Scale: Poor Standing balance comment: relinat on UE with decreased WB tolerated on right leg                           ADL either performed or assessed with clinical judgement   ADL Overall ADL's : Needs assistance/impaired Eating/Feeding: Independent   Grooming: Set up;Sitting   Upper Body Bathing: Set up;Sitting   Lower Body Bathing: Minimal assistance;Sit to/from stand   Upper Body Dressing : Set up;Sitting   Lower Body Dressing: Minimal assistance;Sit to/from stand Lower Body Dressing Details (indicate cue type and reason): Patient reports LB clothing has been limited to shorts and flip flops due to edema in leg. Toilet Transfer: Min Manufacturing systems engineer and Hygiene: Min guard;Sitting/lateral lean               Vision   Vision Assessment?: No apparent visual deficits     Perception     Praxis      Pertinent Vitals/Pain Pain Assessment: Faces Faces Pain Scale: Hurts whole lot Pain Location: RLE Pain Descriptors / Indicators: Grimacing;Discomfort;Sore;Tightness;Guarding Pain Intervention(s): Monitored  during session;Limited activity within patient's tolerance     Hand Dominance     Extremity/Trunk Assessment Upper Extremity Assessment Upper Extremity Assessment:  (WNL ROM, 5/5 strength bilaterally)    Lower Extremity Assessment Lower Extremity Assessment: RLE deficits/detail RLE Deficits / Details: dorsiflex 2+(much edema), knee  extension  2+, much edema, ace wrapped, states top of foot feels slightly numb but feels LT, Knee positioning at ~  30 * flexed and tends to roll out, unable to attempt  more knee extension due to pain reports RLE: Unable to fully assess due to pain   Cervical / Trunk Assessment Cervical / Trunk Assessment: Normal   Communication Communication Communication: No difficulties   Cognition Arousal/Alertness: Awake/alert Behavior During Therapy: WFL for tasks assessed/performed Overall Cognitive Status: Within Functional Limits for tasks assessed                                     General Comments       Exercises     Shoulder Instructions      Home Living Family/patient expects to be discharged to:: Private residence Living Arrangements: Alone Available Help at Discharge: Family;Available PRN/intermittently Type of Home: House Home Access: Stairs to enter [employer] of Steps: 4   Home Layout: One level         Bathroom Toilet: Standard     Home Equipment: Environmental consultant - 2 wheels   Additional Comments: brother lives close by      Prior Functioning/Environment Level of Independence: Independent with assistive device(s)                 OT Problem List: Decreased activity tolerance;Impaired balance (sitting and/or standing);Decreased knowledge of use of DME or AE;Pain      OT Treatment/Interventions: Self-care/ADL training;DME and/or AE instruction;Therapeutic activities;Balance training;Patient/family education    OT Goals(Current goals can be found in the care plan section) Acute Rehab OT Goals Patient Stated Goal: go home OT Goal Formulation: With patient Time For Goal Achievement: 10/24/20 Potential to Achieve Goals: Good  OT Frequency: Min 2X/week   Barriers to D/C:            Co-evaluation               AM-PAC OT "6 Clicks" Daily Activity     Outcome Measure Help from another person eating meals?: None Help from another person taking care of personal grooming?: A Little Help from another person toileting, which includes using toliet, bedpan, or urinal?: A Little Help from another person bathing (including washing, rinsing, drying)?: A Little Help from another person to put on and taking off regular upper body clothing?: A Little Help from another person to put on and taking off regular lower body clothing?: A Little 6 Click Score: 19   End of Session Equipment Utilized During Treatment: Rolling walker Nurse Communication: Mobility status  Activity Tolerance: Patient limited by pain Patient left: in chair;with call bell/phone within reach;with chair alarm set  OT Visit Diagnosis: Unsteadiness on feet (R26.81);Other abnormalities of gait and mobility (R26.89);Pain                Time: 7619-5093 OT Time Calculation (min): 14 min Charges:  OT General Charges $OT Visit: 1 Visit OT Evaluation $OT Eval Low Complexity: 1 Low  Evelette Hollern, OTR/L Acute Care Rehab Services  Office [phone] Pager: [phone]   Kelli Churn 10/10/2020, 4:27 PM

## 2020-10-10 NOTE — Evaluation (Signed)
Physical Therapy Evaluation Patient Details Name: Kyle Pitts MRN: [id number] DOB: 16-Aug-1960 Today's Date: 10/10/2020   History of Present Illness  Kyle Pitts is a 60 y.o. 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  Patient presented to the ER 10/07/20 with worsening swelling of the right lower extremity from the thigh all the way down. S/P ARTHROSCOPY Right KNEE,ARTHROSCOPIC I & D, CALF ABSCESS , fasciotomy anterior compartment on 6/ 22/22  Clinical Impression  The  patient  resting in bed , right LE edema noted, Leg positioned in ~ 30 *Knee flexion, hip in external rotation. Encouraged  patient to work on rolling leg to more neutral. Patient  required min assistance to move right leg to bed edge and down to floor. Patient is  impulsive, provided cues for safety .Patient stood from  raised bed at [facility] with minimal WB on  right leg, multimodal cues for safety, hand placement. Patient lives alone and may require increased assistance at home with more involved RLE deficits than previously after first I and D. Pt admitted with above diagnosis.  Pt currently with functional limitations due to the deficits listed below (see PT Problem List). Pt will benefit from skilled PT to increase their independence and safety with mobility to allow discharge to the venue listed below.     Follow Up Recommendations Home health PT, 24/7 assistance.    Equipment Recommendations  None recommended by PT    Recommendations for Other Services       Precautions / Restrictions Precautions Precautions: Fall Precaution Comments: impulsive Restrictions RLE Weight Bearing: Weight bearing as tolerated Other Position/Activity Restrictions: per ortho, no restrictions for ROM Rknee      Mobility  Bed Mobility Overal bed mobility:  Needs Assistance Bed Mobility: Supine to Sit     Supine to sit: Min assist     General bed mobility comments: cues  for safety, slow down, asssistance to support  right leg  to bed edge and lower to floor    Transfers Overall transfer level: Needs assistance Equipment used: Rolling walker (2 wheeled) Transfers: Sit to/from Stand Sit to Stand: Min assist;+2 safety/equipment;From elevated surface         General transfer comment: multimodal cues for safety and for hand placement, quite impulsive , Stood from  raised bed, recliner and low toilet using rails.  Ambulation/Gait Ambulation/Gait assistance: Mod assist;+2 safety/equipment Gait Distance (Feet): 20 Feet (then 15 'x 2) Assistive device: Rolling walker (2 wheeled) Gait Pattern/deviations: Step-to pattern;Decreased step length - right;Decreased stance time - right;Antalgic Gait velocity: decreased   General Gait Details: decreased weight on right leg, knee is flexed at ~ 20 degrees,  TDWB on right , unable  to place more weight.  Stairs            Wheelchair Mobility    Modified Rankin (Stroke Patients Only)       Balance Overall balance assessment: Needs assistance Sitting-balance support: Bilateral upper extremity supported;Feet supported Sitting balance-Leahy Scale: Fair     Standing balance support: During functional activity;Bilateral upper extremity supported Standing balance-Leahy Scale: Poor Standing balance comment: relinat on UE with decreased WB tolerated on right leg                             Pertinent Vitals/Pain  Faces Pain Scale: Hurts whole lot Pain Location: right lower leg and knee Pain Descriptors / Indicators: Grimacing;Discomfort;Sore;Tightness Pain Intervention(s): Monitored during session;Premedicated before session;Repositioned;Limited activity within patient's tolerance;Patient requesting pain meds-RN notified    Home Living Family/patient expects to be discharged  to:: Private residence Living Arrangements: Alone Available Help at Discharge: Family;Available PRN/intermittently Type of Home: House Home Access: Stairs to enter   [employer] of Steps: 4 ? If rails Home Layout: One level Home Equipment: Walker - 2 wheels Additional Comments: brother lives close by    Prior Function Level of Independence: Independent with assistive device(s)               Hand Dominance        Extremity/Trunk Assessment   Upper Extremity Assessment Upper Extremity Assessment: Overall WFL for tasks assessed    Lower Extremity Assessment Lower Extremity Assessment: RLE deficits/detail RLE Deficits / Details: dorsiflex 2+(much edema), knee  extension  2+, much edema, ace wrapped, states top of foot feels slightly numb but feels LT, Knee positioning at ~  30 * flexed and tends to roll out, unable to attempt  more knee extension due to pain reports RLE: Unable to fully assess due to pain    Cervical / Trunk Assessment Cervical / Trunk Assessment: Normal  Communication   Communication: No difficulties  Cognition Arousal/Alertness: Awake/alert Behavior During Therapy: WFL for tasks assessed/performed;Impulsive                                          General Comments      Exercises     Assessment/Plan    PT Assessment Patient needs continued PT services  PT Problem List Decreased strength;Decreased activity tolerance;Decreased mobility;Pain;Decreased knowledge of use of DME;Decreased safety awareness;Decreased range of motion       PT Treatment Interventions DME instruction;Gait training;Functional mobility training;Therapeutic activities;Patient/family education;Therapeutic exercise;Stair training    PT Goals (Current goals can be found in the Care Plan section)  Acute Rehab PT Goals Patient Stated Goal: less pain in leg PT Goal Formulation: With patient Time For Goal Achievement: 10/24/20 Potential to Achieve  Goals: Good    Frequency Min 6X/week   Barriers to discharge Decreased caregiver support      Co-evaluation               AM-PAC PT "6 Clicks" Mobility  Outcome Measure Help needed turning from your back to your side while in a flat bed without using bedrails?: A Little Help needed moving from lying on your back to sitting on the side of a flat bed without using bedrails?: A Little Help needed moving to and from a bed to a chair (including a wheelchair)?: A Little Help needed standing up from a chair using your arms (e.g., wheelchair or bedside chair)?: A Little Help needed to walk in hospital room?: A Lot Help needed climbing 3-5 steps with a railing? : Total 6 Click Score: 15    End of Session Equipment Utilized During Treatment: Gait belt Activity Tolerance: Patient limited by pain Patient left: in chair;with call bell/phone within reach;with chair alarm set Nurse Communication: Mobility status PT Visit Diagnosis: Other abnormalities of gait and mobility (R26.89);Difficulty in walking, not elsewhere classified (R26.2)    Time: 5621-3086 PT Time Calculation (min) (ACUTE ONLY): 39 min   Charges:   PT Evaluation $PT Eval Moderate Complexity: 1 Mod PT Treatments $  Gait Training: 23-37 mins        Blanchard Kelch PT Acute Rehabilitation Services Pager [phone] Office [phone]   Rada Hay 10/10/2020, 3:52 PM

## 2020-10-11 LAB — COMPREHENSIVE METABOLIC PANEL
ALT: 37 U/L (ref 0–44)
AST: 63 U/L — ABNORMAL HIGH (ref 15–41)
Albumin: 1.7 g/dL — ABNORMAL LOW (ref 3.5–5.0)
Alkaline Phosphatase: 275 U/L — ABNORMAL HIGH (ref 38–126)
Anion gap: 4 — ABNORMAL LOW (ref 5–15)
BUN: 11 mg/dL (ref 6–20)
CO2: 27 mmol/L (ref 22–32)
Calcium: 8.5 mg/dL — ABNORMAL LOW (ref 8.9–10.3)
Chloride: 99 mmol/L (ref 98–111)
Creatinine, Ser: 0.57 mg/dL — ABNORMAL LOW (ref 0.61–1.24)
GFR, Estimated: >60 mL/min (ref >60)
Glucose, Bld: 85 mg/dL (ref 70–99)
Potassium: 4 mmol/L (ref 3.5–5.1)
Sodium: 130 mmol/L — ABNORMAL LOW (ref 135–145)
Total Bilirubin: 2.4 mg/dL — ABNORMAL HIGH (ref 0.3–1.2)
Total Protein: 6.6 g/dL (ref 6.5–8.1)

## 2020-10-11 LAB — CBC WITH DIFFERENTIAL/PLATELET
Abs Immature Granulocytes: 0 10*3/uL (ref 0.00–0.07)
Basophils Absolute: 0.3 10*3/uL — ABNORMAL HIGH (ref 0.0–0.1)
Basophils Relative: 3 %
Eosinophils Absolute: 0.1 10*3/uL (ref 0.0–0.5)
Eosinophils Relative: 1 %
HCT: 21.3 % — ABNORMAL LOW (ref 39.0–52.0)
Hemoglobin: 7.1 g/dL — ABNORMAL LOW (ref 13.0–17.0)
Lymphocytes Relative: 12 %
Lymphs Abs: 1.1 10*3/uL (ref 0.7–4.0)
MCH: 35.3 pg — ABNORMAL HIGH (ref 26.0–34.0)
MCHC: 33.3 g/dL (ref 30.0–36.0)
MCV: 106 fL — ABNORMAL HIGH (ref 80.0–100.0)
Monocytes Absolute: 0.4 10*3/uL (ref 0.1–1.0)
Monocytes Relative: 4 %
Neutro Abs: 7.1 10*3/uL (ref 1.7–7.7)
Neutrophils Relative %: 80 %
Platelets: 218 10*3/uL (ref 150–400)
RBC: 2.01 MIL/uL — ABNORMAL LOW (ref 4.22–5.81)
RDW: 14.2 % (ref 11.5–15.5)
WBC: 8.9 10*3/uL (ref 4.0–10.5)
nRBC: 0 % (ref 0.0–0.2)

## 2020-10-11 MED ORDER — SODIUM CHLORIDE 0.9 % IV SOLN
2.0000 g | INTRAVENOUS | Status: DC
  Administered 2020-10-11 – 2020-10-14 (×4): 2 g via INTRAVENOUS
  Filled 2020-10-11 (×4): qty 2

## 2020-10-11 MED ORDER — METHOCARBAMOL 500 MG PO TABS: 500.0000 mg | ORAL_TABLET | Freq: Three times a day (TID) | ORAL | 0 refills | Status: AC | PRN

## 2020-10-11 MED ORDER — CEFTRIAXONE IV (FOR PTA / DISCHARGE USE ONLY): 2.0000 g | INTRAVENOUS | 0 refills | Status: DC

## 2020-10-11 MED ORDER — HYDROCODONE-ACETAMINOPHEN 5-325 MG PO TABS: 1.0000 | ORAL_TABLET | Freq: Four times a day (QID) | ORAL | 0 refills | Status: DC | PRN

## 2020-10-11 NOTE — Progress Notes (Signed)
None PROGRESS NOTE    Kyle Pitts  OHY:[id number] DOB: June 05, 1960 DOA: 10/07/2020 PCP: [name], MD   Brief Narrative:  Kyle Pitts is a 60 y.o. 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  Patient presented to the ER with worsening swelling of the right lower extremity from the thigh all the way down.  Associated with pain redness and some subjective fever.  On arrival in the ER he appears to have significant cellulitis involving most of the right lower extremity.  He was hemodynamically stable. White count 14.0, Sodium 123 potassium.  Patient was admitted to hospitalist rest, orthopedics consulted.  Further hospital course as below.  Assessment & Plan:   Principal Problem:   Cellulitis and abscess of right leg Active Problems:   Septic arthritis of knee (HCC)   Hyponatremia   Leucocytosis     #1 cellulitis of the right lower extremity S/p arthroscopic incision and drainage for septic right knee 09/27/2020 during recent hospitalization admitted with right lower extremity cellulitis/myofascial disc of thigh and lower leg/abscess anterior compartment of lower leg/moderate to large joint effusion of right knee with septic arthritis:  Failed IV Rocephin.  Started on vancomycin and cefepime.  Ortho consulted.  They obtained MRI right lower extremity which shows extensive severe cellulitis and mild fasciitis involving anterior compartment and posterior compartment as well as lower leg with possible intramuscular abscess in anterior compartment in the lower leg.  Moderate to large sized joint effusion on the right knee with possible septic arthritis and osteomyelitis.  Patient's erythema/cellulitis had improved somewhat.  He underwent  following procedures by Ortho on 10/09/2020. 1.  Right knee arthroscopic I&D for septic arthritis 2.  Right knee arthroscopic major synovectomy 3.  Right leg anterior and lateral compartment fasciotomy 4.  Right lower leg irrigation debridement of skin, subcutaneous tissue, muscle, and fascia for infection. His pain is improved today.  Ortho has consulted ID.  Will defer to them for antibiotic choice and duration.  Further management per orthopedics.  # 2.  Elevated bilirubin and alkaline phosphatase: Improving.  Monitor.  #3  Chronic hyponatremia: Stable at his baseline.  #4.  Acute blood loss anemia: Baseline hemoglobin around 11.  Hemoglobin dropped to 7.1 today due to extensive procedures that he had.  He is asymptomatic so no indication of transfusion.  We will recheck hemoglobin tomorrow and transfuse if drops less than 7.   DVT prophylaxis: enoxaparin (LOVENOX) injection 40 mg Start: 10/10/20 0800 SCDs Start: 10/09/20 2012   Code Status: Full Code  Family Communication:  None present at bedside.  Plan of care discussed with patient in length and he verbalized understanding and agreed with it.  Status is: Inpatient  Remains inpatient appropriate because:IV treatments appropriate due to intensity of illness or inability to take PO  Dispo: The patient is from: Home              Anticipated d/c is to: Home              Patient currently is not medically stable to d/c.   Difficult to place patient No        Estimated body mass index is 29.84 kg/m as calculated from the following:   Height as of this encounter:  6' (1.829 m).   Weight as of this encounter: 99.8 kg.      Nutritional status:               Consultants:  Orthopedics  Procedures:  1.  Right knee arthroscopic I&D for septic arthritis 2.  Right knee arthroscopic major synovectomy 3.  Right leg anterior and lateral compartment fasciotomy 4.  Right lower leg irrigation debridement of skin,  subcutaneous tissue, muscle, and fascia for infection.  Antimicrobials:  Anti-infectives (From admission, onward)    Start     Dose/Rate Route Frequency Ordered Stop   10/08/20 1000  vancomycin (VANCOREADY) IVPB 1250 mg/250 mL  Status:  Discontinued        1,250 mg 166.7 mL/hr over 90 Minutes Intravenous Every 12 hours 10/07/20 2127 10/10/20 1356   10/08/20 0800  vancomycin (VANCOREADY) IVPB 1250 mg/250 mL  Status:  Discontinued        1,250 mg 166.7 mL/hr over 90 Minutes Intravenous Every 12 hours 10/07/20 2018 10/07/20 2127   10/08/20 0600  ceFEPIme (MAXIPIME) 2 g in sodium chloride 0.9 % 100 mL IVPB        2 g 200 mL/hr over 30 Minutes Intravenous Every 8 hours 10/07/20 2018     10/07/20 2130  vancomycin (VANCOREADY) IVPB 2000 mg/400 mL        2,000 mg 200 mL/hr over 120 Minutes Intravenous  Once 10/07/20 2127 10/08/20 0024   10/07/20 2030  vancomycin (VANCOCIN) IVPB 1000 mg/200 mL premix  Status:  Discontinued        1,000 mg 200 mL/hr over 60 Minutes Intravenous  Once 10/07/20 2018 10/07/20 2127   10/07/20 2030  ceFEPIme (MAXIPIME) 2 g in sodium chloride 0.9 % 100 mL IVPB        2 g 200 mL/hr over 30 Minutes Intravenous  Once 10/07/20 2018 10/07/20 2123   10/07/20 2015  cefTRIAXone (ROCEPHIN) 1 g in sodium chloride 0.9 % 100 mL IVPB  Status:  Discontinued        1 g 200 mL/hr over 30 Minutes Intravenous  Once 10/07/20 2003 10/07/20 2018   10/07/20 1945  cefTRIAXone (ROCEPHIN) 2 g in sodium chloride 0.9 % 100 mL IVPB  Status:  Discontinued        2 g 200 mL/hr over 30 Minutes Intravenous  Once 10/07/20 1942 10/07/20 2003   10/07/20 1915  cefTRIAXone (ROCEPHIN) 1 g in sodium chloride 0.9 % 100 mL IVPB  Status:  Discontinued        1 g 200 mL/hr over 30 Minutes Intravenous  Once 10/07/20 1902 10/07/20 2019   10/07/20 1915  vancomycin (VANCOCIN) IVPB 1000 mg/200 mL premix  Status:  Discontinued        1,000 mg 200 mL/hr over 60 Minutes Intravenous  Once 10/07/20 1902 10/07/20 2127           Subjective:  Patient seen and examined.  He has no new complaint.  His pain is controlled.  Objective: Vitals:   10/10/20 1342 10/10/20 1652 10/10/20 2154 10/11/20 0531  BP: (!) 155/87 (!) 150/82 (!) 144/62 134/72  Pulse: 80 79 85 79  Resp: 18 19 18 17   Temp: 98.3 F (36.8 C) 98.4 F (36.9 C) 98.4 F (36.9 C) 97.8 F (36.6 C)  TempSrc: Oral Oral Oral Oral  SpO2: 97% 98% 100% 98%  Weight:      Height:        Intake/Output Summary (Last 24 hours) at 10/11/2020 1127 Last data  filed at 10/11/2020 1023 Gross per 24 hour  Intake 2885.93 ml  Output 3225 ml  Net -339.07 ml    Filed Weights   10/07/20 1716 10/09/20 1302  Weight: 99.8 kg 99.8 kg    Examination:  General exam: Appears calm and comfortable  Respiratory system: Clear to auscultation. Respiratory effort normal. Cardiovascular system: S1 & S2 heard, RRR. No JVD, murmurs, rubs, gallops or clicks.  +3 pitting edema right lower extremity. Gastrointestinal system: Abdomen is nondistended, soft and nontender. No organomegaly or masses felt. Normal bowel sounds heard. Central nervous system: Alert and oriented. No focal neurological deficits. Extremities: Symmetric 5 x 5 power. Skin: Dressing in the right lower extremity. Psychiatry: Judgement and insight appear normal. Mood & affect appropriate.          Data Reviewed: I have personally reviewed following labs and imaging studies  CBC: Recent Labs  Lab 10/07/20 1838 10/08/20 0130 10/08/20 0539 10/09/20 0336 10/10/20 0315 10/11/20 0459  WBC 15.6* 14.0* 17.5* 11.1* 11.5* 8.9  NEUTROABS 13.0*  --   --  7.2 7.7 7.1  HGB 11.0* 11.3* 11.3* 9.8* 7.7* 7.1*  HCT 31.0* 32.3* 31.6* 27.9* 23.7* 21.3*  MCV 100.3* 100.9* 100.0 100.7* 106.3* 106.0*  PLT 181 195 206 206 212 218    Basic Metabolic Panel: Recent Labs  Lab 10/07/20 1838 10/08/20 0130 10/08/20 0539 10/09/20 0336 10/10/20 0315 10/11/20 0459  NA 123*  --  128* 129* 128* 130*  K  4.1  --  4.3 4.0 4.1 4.0  CL 89*  --  95* 97* 100 99  CO2 28  --  28 27 26 27   GLUCOSE 79  --  86 88 123* 85  BUN 25*  --  16 15 17 11   CREATININE 0.95 0.85 0.76 0.58* 0.65 0.57*  CALCIUM 8.5*  --  8.8* 8.7* 8.4* 8.5*    GFR: Estimated Creatinine Clearance: 123.1 mL/min (A) (by C-G formula based on SCr of 0.57 mg/dL (L)). Liver Function Tests: Recent Labs  Lab 10/08/20 0539 10/10/20 0315 10/11/20 0459  AST 59* 48* 63*  ALT 43 32 37  ALKPHOS 341* 253* 275*  BILITOT 4.4* 3.1* 2.4*  PROT 7.1 6.4* 6.6  ALBUMIN 2.2* 1.7* 1.7*    No results for input(s): LIPASE, AMYLASE in the last 168 hours. No results for input(s): AMMONIA in the last 168 hours. Coagulation Profile: No results for input(s): INR, PROTIME in the last 168 hours. Cardiac Enzymes: No results for input(s): CKTOTAL, CKMB, CKMBINDEX, TROPONINI in the last 168 hours. BNP (last 3 results) No results for input(s): PROBNP in the last 8760 hours. HbA1C: No results for input(s): HGBA1C in the last 72 hours. CBG: No results for input(s): GLUCAP in the last 168 hours. Lipid Profile: No results for input(s): CHOL, HDL, LDLCALC, TRIG, CHOLHDL, LDLDIRECT in the last 72 hours. Thyroid Function Tests: No results for input(s): TSH, T4TOTAL, FREET4, T3FREE, THYROIDAB in the last 72 hours. Anemia Panel: No results for input(s): VITAMINB12, FOLATE, FERRITIN, TIBC, IRON, RETICCTPCT in the last 72 hours. Sepsis Labs: Recent Labs  Lab 10/07/20 1848 10/08/20 0130  LATICACIDVEN 0.9 1.0     Recent Results (from the past 240 hour(s))  Culture, blood (routine x 2)     Status: None (Preliminary result)   Collection Time: 10/07/20  6:25 PM   Specimen: BLOOD LEFT FOREARM  Result Value Ref Range Status   Specimen Description   Final    BLOOD LEFT FOREARM Performed at [facility], [address] [place], [place] [address]    Special Requests   Final    BOTTLES DRAWN AEROBIC ONLY Blood Culture results may not  be optimal due to an inadequate volume of blood received in culture bottles Performed at [facility], [address] [place], [place] [address]    Culture   Final    NO GROWTH 3 DAYS Performed at [facility], [address], [place], [place] [address]    Report Status PENDING  Incomplete  Culture, blood (routine x 2)     Status: None (Preliminary result)   Collection Time: 10/07/20  6:35 PM   Specimen: Site Not Specified; Blood  Result Value Ref Range Status   Specimen Description   Final    SITE NOT SPECIFIED Performed at [facility], [address] [place], [place] [address]    Special Requests   Final    BOTTLES DRAWN AEROBIC AND ANAEROBIC Blood Culture adequate volume Performed at [facility], [address], [place], [place] [address]    Culture   Final    NO GROWTH 3 DAYS Performed at [facility], [address], [place], [place] [address]    Report Status PENDING  Incomplete  Resp Panel by RT-PCR (Flu A&B, Covid) Nasopharyngeal Swab     Status: None   Collection Time: 10/07/20  6:38 PM   Specimen: Nasopharyngeal Swab; Nasopharyngeal(NP) swabs in vial transport medium  Result Value Ref Range Status   SARS Coronavirus 2 by RT PCR NEGATIVE NEGATIVE Final    Comment: (NOTE) SARS-CoV-2 target nucleic acids are NOT DETECTED.  The SARS-CoV-2 RNA is generally detectable in upper respiratory specimens during the acute phase of infection. The lowest concentration of SARS-CoV-2 viral copies this assay can detect is 138 copies/mL. A negative result does not preclude SARS-Cov-2 infection and should not be used as the sole basis for treatment or other patient management decisions. A negative result may occur with  improper specimen collection/handling, submission of specimen other than nasopharyngeal swab, presence of viral mutation(s) within the areas targeted by this assay, and inadequate number of  viral copies(<138 copies/mL). A negative result must be combined with clinical observations, patient history, and epidemiological information. The expected result is Negative.  Fact Sheet for Patients:  BloggerCourse.com  Fact Sheet for Healthcare Providers:  SeriousBroker.it  This test is no t yet approved or cleared by the [place] FDA and  has been authorized for detection and/or diagnosis of SARS-CoV-2 by FDA under an Emergency Use Authorization (EUA). This EUA will remain  in effect (meaning this test can be used) for the duration of the COVID-19 declaration under Section 564(b)(1) of the Act, 21 U.S.C.section 360bbb-3(b)(1), unless the authorization is terminated  or revoked sooner.       Influenza A by PCR NEGATIVE NEGATIVE Final   Influenza B by PCR NEGATIVE NEGATIVE Final    Comment: (NOTE) The Xpert Xpress SARS-CoV-2/FLU/RSV plus assay is intended as an aid in the diagnosis of influenza from Nasopharyngeal swab specimens and should not be used as a sole basis for treatment. Nasal washings and aspirates are unacceptable for Xpert Xpress SARS-CoV-2/FLU/RSV testing.  Fact Sheet for Patients: BloggerCourse.com  Fact Sheet for Healthcare Providers: SeriousBroker.it  This test is not yet approved or cleared by the [place] FDA and has been authorized for detection and/or diagnosis of SARS-CoV-2 by FDA under an Emergency Use Authorization (EUA). This EUA will remain in effect (meaning this  test can be used) for the duration of the COVID-19 declaration under Section 564(b)(1) of the Act, 21 U.S.C. section 360bbb-3(b)(1), unless the authorization is terminated or revoked.  Performed at [facility], [address] [place], [place] [address]   Surgical pcr screen     Status: None   Collection Time: 10/09/20  5:39 AM   Specimen: Nasal Mucosa;  Nasal Swab  Result Value Ref Range Status   MRSA, PCR NEGATIVE NEGATIVE Final   Staphylococcus aureus NEGATIVE NEGATIVE Final    Comment: (NOTE) The Xpert SA Assay (FDA approved for NASAL specimens in patients 59 years of age and older), is one component of a comprehensive surveillance program. It is not intended to diagnose infection nor to guide or monitor treatment. Performed at [facility], [address] [place], [place] [address]        Radiology Studies: No results found.  Scheduled Meds:  celecoxib  100 mg Oral BID   Chlorhexidine Gluconate Cloth  6 each Topical Daily   docusate sodium  100 mg Oral BID   enoxaparin (LOVENOX) injection  40 mg Subcutaneous Q24H   sodium chloride flush  10-40 mL Intracatheter Q12H   Continuous Infusions:  sodium chloride 100 mL/hr at 10/11/20 0759   ceFEPime (MAXIPIME) IV 2 g (10/11/20 0517)     LOS: 4 days   Time spent: 28 minutes   Hughie Clossavi Luxe Cuadros, MD [facility]  10/11/2020, 11:27 AM   How to contact the [facility] Attending or Consulting provider 7A - 7P or covering provider during after hours 7P -7A, for this patient?  Check the care team in [facility] and look for a) attending/consulting TR[facility] provider listed and b) the [facility] team listed. Page or secure chat 7A-7P. Log into [email or web address] and use [place]'s universal password to access. If you do not have the password, please contact the hospital operator. Locate the [facility] provider you are looking for under [facility] and page to a number that you can be directly reached. If you still have difficulty reaching the provider, please page the [facility] (Director on Call) for the Hospitalists listed on amion for assistance.

## 2020-10-11 NOTE — Progress Notes (Addendum)
Physical Therapy Treatment Patient Details Name: Kyle Pitts MRN: [id number] DOB: Jan 06, 1961 Today's Date: 10/11/2020    History of Present Illness Kyle Pitts is a 60 y.o. 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  Patient presented to the ER 10/07/20 with worsening swelling of the right lower extremity from the thigh all the way down. S/PARTHROSCOPY KNEE,ARTHROSCOPIC I & D, CALF ABSCESS  IRRIGATION AND DEBRIDEMENT EXTREMITY, fasciotomy anterior compartment on 6/ 22/22    PT Comments    Pt needed less assistance with bed mobility this session as he was independent with supine to sit. He continues to ambulate with toe-touch weight bearing on the RLE, and continues to be impulsive needing multiple cues to slow down. Despite decreased hemoglobin levels, BP remained at therapeutic level during session and did not decrease. Pt continues with c/o pain being his limiting factor to activity tolerance.  Pt will benefit from cntinued PT to increase their independence and safety with mobility to allow discharge to the venue listed below.     Follow Up Recommendations  Home health PT     Equipment Recommendations  None recommended by PT    Recommendations for Other Services       Precautions / Restrictions Precautions Precautions: Fall Restrictions RLE Weight Bearing: Weight bearing as tolerated    Mobility  Bed Mobility Overal bed mobility: Needs Assistance Bed Mobility: Supine to Sit     Supine to sit: Supervision Sit to supine: Supervision        Transfers Overall transfer level: Needs assistance Equipment used: Rolling walker (2 wheeled) Transfers: Sit to/from Stand Sit to Stand: Min guard            Ambulation/Gait Ambulation/Gait assistance: Min Chemical engineer (Feet): 40  Feet Assistive device: Rolling walker (2 wheeled) Gait Pattern/deviations: Step-to pattern;Decreased step length - right;Decreased stance time - right;Antalgic     General Gait Details: TDWB on RLE, R knee continues to be flexed   Stairs             Wheelchair Mobility    Modified Rankin (Stroke Patients Only)       Balance                                            Cognition Arousal/Alertness: Awake/alert Behavior During Therapy: WFL for tasks assessed/performed Overall Cognitive Status: Within Functional Limits for tasks assessed                                 General Comments: AxO x 3 but very umcomfortable      Exercises      General Comments        Pertinent Vitals/Pain Pain Assessment: 0-10 Pain Score: 9  Pain Location: RLE Pain Descriptors / Indicators: Grimacing;Discomfort;Sore;Tightness;Guarding Pain Intervention(s): Monitored during session;Limited activity within patient's tolerance    Home Living                      Prior Function            PT Goals (current goals can now be found in the care plan section) Acute Rehab PT Goals Patient  Stated Goal: go home PT Goal Formulation: With patient Time For Goal Achievement: 10/24/20 Potential to Achieve Goals: Good Progress towards PT goals: Progressing toward goals    Frequency    Min 6X/week      PT Plan Current plan remains appropriate    Co-evaluation              AM-PAC PT "6 Clicks" Mobility   Outcome Measure  Help needed turning from your back to your side while in a flat bed without using bedrails?: A Little Help needed moving from lying on your back to sitting on the side of a flat bed without using bedrails?: A Little Help needed moving to and from a bed to a chair (including a wheelchair)?: A Little Help needed standing up from a chair using your arms (e.g., wheelchair or bedside chair)?: A Little Help needed to walk in  hospital room?: A Lot Help needed climbing 3-5 steps with a railing? : Total 6 Click Score: 15    End of Session Equipment Utilized During Treatment: Gait belt Activity Tolerance: Patient limited by pain Patient left: in bed;with call bell/phone within reach;with bed alarm set Nurse Communication: Mobility status PT Visit Diagnosis: Other abnormalities of gait and mobility (R26.89);Difficulty in walking, not elsewhere classified (R26.2)     Time:  - 11:35 - 12:00    Charges:    1  gt    1 ta                    Alma Friendly, PTA Student  Acute Rehabilitation Services Pager : [phone] Office : [phone]

## 2020-10-11 NOTE — Progress Notes (Signed)
Subjective:  Kyle Pitts is a 60 y.o. male, 2 Days Post-Op    s/p Procedure(s): ARTHROSCOPY KNEE,ARTHROSCOPIC I & D, CALF ABSCESS IRRIGATION AND DEBRIDEMENT EXTREMITY   Patient reports pain as mild to moderate.  Reports improvement in pain compared to yesterday again, pain with weightbearing and range of motion.  Does report weakness of the right leg.  Overall in good spirits. Objective:   VITALS:   Vitals:   10/10/20 1652 10/10/20 2154 10/11/20 0531 10/11/20 1254  BP: (!) 150/82 (!) 144/62 134/72   Pulse: 79 85 79   Resp: 19 18 17    Temp: 98.4 F (36.9 C) 98.4 F (36.9 C) 97.8 F (36.6 C)   TempSrc: Oral Oral Oral   SpO2: 98% 100% 98% 98%  Weight:      Height:       Constitutional: General Appearance: healthy-appearing, well-nourished, and well-developed.  In hospital chair Level of Distress: no acute distress. Eyes: Lens (normal) clear: both eyes. Head: Head: normocephalic and atraumatic. Lungs: Respiratory effort: no dyspnea. Skin: Inspection and palpation: no rash, lesions Neurologic: Cranial Nerves: grossly intact. Sensation: grossly intact. Psychiatric: Insight: good judgement and insight. Mental Status: normal mood and affect and active and alert. Orientation: to time, place, and person.  Right Lower Extremity:    Inspection:Significant 2+ edema from the lateral mid thigh down to the foot swelling .  Drain was removed today, dressing removed showing wound without signs of acute surgical infection with 3 arthroscopic portals at the knee and longitudinal incision on the lateral lower leg.  Slightly improving erythema.  Edema also slightly improved from yesterday.  Knee range of motion 0-65.  Erythema noted on the lateral thigh, medial and lateral lower leg.   intact extensor mech No posterior calf tenderness Distal ankle and foot strength normal Sensation is intact to light touch Extremity is warm and well perfuse,cap refill less than 2 seconds.             Lab Results  Component Value Date   WBC 8.9 10/11/2020   HGB 7.1 (L) 10/11/2020   HCT 21.3 (L) 10/11/2020   MCV 106.0 (H) 10/11/2020   PLT 218 10/11/2020   BMET    Component Value Date/Time   NA 130 (L) 10/11/2020 0459   K 4.0 10/11/2020 0459   CL 99 10/11/2020 0459   CO2 27 10/11/2020 0459   GLUCOSE 85 10/11/2020 0459   BUN 11 10/11/2020 0459   CREATININE 0.57 (L) 10/11/2020 0459   CALCIUM 8.5 (L) 10/11/2020 0459   GFRNONAA >60 10/11/2020 0459   GFRAA  05/13/2010 1718    >60        The eGFR has been calculated using the MDRD equation. This calculation has not been validated in all clinical situations. eGFR's persistently <60 mL/min signify possible Chronic Kidney Disease.     Assessment/Plan: 2 Days Post-Op   Principal Problem:   Cellulitis and abscess of right leg Active Problems:   Septic arthritis of knee (HCC)   Hyponatremia   Leucocytosis   Advance diet Up with therapy Infectious disease was consulted for appropriate antibiotics while inpatient and for discharge . Drain was removed today and dressings were changed, see photos above for photos.  I do think there is some improvement in his erythema in comparison to yesterday as well as his edema. If he  Has continued improvement would likely be okay for discharge Sunday or Monday.  I would prefer to see continued decrease in edema and erythema  prior to discharge.  Weekend team can consider discharge Sunday if continued improvement with IV ceftriaxone upon discharge per pharmacy consult note.  PICC line still in place.  weightbearing Status: WBAT, ROM as tolerated.  DVT Prophylaxis: Lovenox while inpatient, aspirin 81 mg upon discharge for DVT prophylaxis  Upon discharge would like to follow-up in 1 week for repeat wound check.  Sutures will remain in for 2 to 3 weeks.  Dressing will be changed as needed when saturated with nonadherent dressing, ABD pads, Kling, and Ace.  Faythe Casa 10/11/2020, 1:33 PM  Jonelle Sidle PA-C  Physician Assistant with Dr. Lillia Abed [facility]

## 2020-10-11 NOTE — Progress Notes (Signed)
PHARMACY CONSULT NOTE FOR:  OUTPATIENT  PARENTERAL ANTIBIOTIC THERAPY (OPAT)  Indication: Septic arthritis of knee Regimen: ceftriaxone 2g IV q24h End date: 11/20/2020  IV antibiotic discharge orders are pended. To discharging provider:  please sign these orders via discharge navigator,  Select New Orders & click on the button choice - Manage This Unsigned Work.     Thank you for allowing pharmacy to be a part of this patient's care.  Jeannetta Nap 10/11/2020, 12:41 PM

## 2020-10-11 NOTE — Consult Note (Signed)
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  Cultures growing Streptococcus pyogenes Status post arthroscopic I&D, major synovectomy, anterior and lateral compartment fasciotomy along with low right lower leg irrigation and debridement on 6/22.  No culture sent  Recommendations  Recommend to narrow antibiotics to ceftriaxone 2 g IV daily Will need 6 weeks of antibiotics from date of source control procedure on 6/22 given osteomyelitis. End date 11/20/20 Monitor CBC CMP ESR and CRP weekly A follow-up with [facility] will be made upon discharge ID will sign off. Please call with questions.   Rest of the management as per the primary team. Please call with questions or concerns.  Thank you for the consult  Rosiland Oz, MD Infectious Disease Physician [facility] [address] [place], [place] [address] Phone: [phone]  Fax: [phone]  __________________________________________________________________________________________________________ HPI and Hospital Course: 60 year old male with past medical history of right knee septic arthritis status post arthroscopic I&D for septic arthritis of the  right knee was getting IV ceftriaxone after recent hospital admission 6/10-6/15 for the same who came to the ED on 6/20 with complaints of worsening swelling of the right lower extremity from his thigh all the way down with associated redness, pain and subjective fever.  He tells me he was getting his IV antibiotics as instructed.  Any injuries, insect bite or trauma.   At ED he was afebrile, he had leukocytosis up to 17.5.  MRI right lower extremity as below Blood cultures x2 no growth to date He was taken to the OR on 6/22 for right knee arthroscopic I&D, major synovectomy, anterior and lateral compartment fasciotomy along with right lower leg irrigation and debridement of skin subcutaneous tissue muscle and fossa.  Cultures have been sent  ROS: General- Denies fever, chills, loss of appetite and loss of weight HEENT - Denies headache, blurry vision, neck pain, sinus pain Chest - Denies any chest pain, SOB or cough CVS- Denies any dizziness/lightheadedness, syncopal attacks, palpitations Abdomen- Denies any nausea, vomiting, abdominal pain, hematochezia and diarrhea Neuro - Denies any weakness, numbness, tingling sensation Psych - Denies any changes in mood irritability or depressive symptoms GU- Denies any burning, dysuria, hematuria or increased frequency of urination Skin - denies any rashes/lesions MSK -right knee and leg pain and swelling and restricted range of motion due to pain+  History  reviewed. No pertinent past medical history.  Past Surgical History:  Procedure Laterality Date   I & D EXTREMITY Right 10/09/2020   Procedure: IRRIGATION AND DEBRIDEMENT EXTREMITY;  Surgeon: Nicholes Stairs, MD;  Location: [facility];  Service: Orthopedics;  Laterality: Right;   KNEE ARTHROSCOPY Right 09/27/2020   Procedure: ARTHROSCOPY KNEE, [place];  Surgeon: Melina Schools, MD;  Location: [facility];  Service: Orthopedics;  Laterality: Right;   KNEE ARTHROSCOPY Right 10/09/2020   Procedure:  ARTHROSCOPY KNEE,ARTHROSCOPIC I & D, CALF ABSCESS;  Surgeon: Nicholes Stairs, MD;  Location: [facility];  Service: Orthopedics;  Laterality: Right;     Scheduled Meds:  celecoxib  100 mg Oral BID   Chlorhexidine Gluconate Cloth  6 each Topical Daily   docusate sodium  100 mg Oral BID   enoxaparin (LOVENOX) injection  40 mg Subcutaneous Q24H   sodium chloride flush  10-40 mL Intracatheter Q12H   Continuous Infusions:  sodium chloride 100 mL/hr at 10/11/20 0759   ceFEPime (MAXIPIME) IV 2 g (10/11/20 0517)   PRN Meds:.acetaminophen, albuterol, HYDROcodone-acetaminophen, methocarbamol, metoCLOPramide **OR** metoCLOPramide (REGLAN) injection, morphine injection, ondansetron **OR** ondansetron (ZOFRAN) IV, sodium chloride flush  Allergies  Allergen Reactions   Cephalexin Nausea Only    Upset stomach   Social History   Socioeconomic History   Marital status: Divorced    Spouse name: Not on file   Number of children: Not on file   Years of education: Not on file   Highest education level: Not on file  Occupational History   Not on file  Tobacco Use   Smoking status: Never   Smokeless tobacco: Never  Vaping Use   Vaping Use: Never used  Substance and Sexual Activity   Alcohol use: Yes    Alcohol/week: 24.0 standard drinks    Types: 24 Cans of beer per week    Comment: 3-4 when home from work   Drug use: Not Currently    Types: Cocaine, Marijuana    Comment: last used 1 month ago   Sexual activity: Not Currently  Other Topics Concern   Not on file  Social History Narrative   Not on file   Social Determinants of Health   Financial Resource Strain: Not on file  Food Insecurity: Not on file  Transportation Needs: Not on file  Physical Activity: Not on file  Stress: Not on file  Social Connections: Not on file  Intimate Partner Violence: Not on file    Vitals BP 134/72 (BP Location: Left Arm)   Pulse 79   Temp 97.8 F (36.6 C) (Oral)   Resp 17   Ht 6' (1.829 m)    Wt 99.8 kg   SpO2 98%   BMI 29.84 kg/m    Physical Exam Constitutional: Not in acute distress, lying in bed    Comments:   Cardiovascular:     Rate and Rhythm: Normal rate and regular rhythm.     Heart sounds:   Pulmonary:     Effort: Pulmonary effort is normal.     Comments: Clear lung fields bilaterally  Abdominal:     Palpations: Abdomen is soft.     Tenderness: Nontender and nondistended  Musculoskeletal:        General: RT knee is wrapped in a bandage, Pictures in the media seen    Skin:    Comments: no lesions or rashes  Neurological:     General: No focal deficit present.   Psychiatric:  Mood and Affect: Mood normal.    Pertinent Microbiology Results for orders placed or performed during the hospital encounter of 10/07/20  Culture, blood (routine x 2)     Status: None (Preliminary result)   Collection Time: 10/07/20  6:25 PM   Specimen: BLOOD LEFT FOREARM  Result Value Ref Range Status   Specimen Description   Final    BLOOD LEFT FOREARM Performed at [facility] [address], [place], [place] [address]    Special Requests   Final    BOTTLES DRAWN AEROBIC ONLY Blood Culture results may not be optimal due to an inadequate volume of blood received in culture bottles Performed at [facility] [address], [place], [place] [address]    Culture   Final    NO GROWTH 3 DAYS Performed at [facility], [place] [address] [place], [place] [address]    Report Status PENDING  Incomplete  Culture, blood (routine x 2)     Status: None (Preliminary result)   Collection Time: 10/07/20  6:35 PM   Specimen: Site Not Specified; Blood  Result Value Ref Range Status   Specimen Description   Final    SITE NOT SPECIFIED Performed at [facility] [address], [place], [place] [address]    Special Requests   Final    BOTTLES DRAWN AEROBIC AND ANAEROBIC Blood Culture adequate volume Performed  at [facility] [address], [place], [place] [address]    Culture   Final    NO GROWTH 3 DAYS Performed at [facility], [place] [address] [place], [place] [address]    Report Status PENDING  Incomplete  Resp Panel by RT-PCR (Flu A&B, Covid) Nasopharyngeal Swab     Status: None   Collection Time: 10/07/20  6:38 PM   Specimen: Nasopharyngeal Swab; Nasopharyngeal(NP) swabs in vial transport medium  Result Value Ref Range Status   SARS Coronavirus 2 by RT PCR NEGATIVE NEGATIVE Final    Comment: (NOTE) SARS-CoV-2 target nucleic acids are NOT DETECTED.  The SARS-CoV-2 RNA is generally detectable in upper respiratory specimens during the acute phase of infection. The lowest concentration of SARS-CoV-2 viral copies this assay can detect is 138 copies/mL. A negative result does not preclude SARS-Cov-2 infection and should not be used as the sole basis for treatment or other patient management decisions. A negative result may occur with  improper specimen collection/handling, submission of specimen other than nasopharyngeal swab, presence of viral mutation(s) within the areas targeted by this assay, and inadequate number of viral copies(<138 copies/mL). A negative result must be combined with clinical observations, patient history, and epidemiological information. The expected result is Negative.  Fact Sheet for Patients:  EntrepreneurPulse.com.au  Fact Sheet for Healthcare Providers:  IncredibleEmployment.be  This test is no t yet approved or cleared by the [place] FDA and  has been authorized for detection and/or diagnosis of SARS-CoV-2 by FDA under an Emergency Use Authorization (EUA). This EUA will remain  in effect (meaning this test can be used) for the duration of the COVID-19 declaration under Section 564(b)(1) of the Act, 21 U.S.C.section 360bbb-3(b)(1), unless the authorization is terminated  or revoked  sooner.       Influenza A by PCR NEGATIVE NEGATIVE Final   Influenza B by PCR NEGATIVE NEGATIVE Final    Comment: (NOTE) The Xpert Xpress SARS-CoV-2/FLU/RSV plus assay is intended as an aid in the diagnosis of influenza from Nasopharyngeal swab  specimens and should not be used as a sole basis for treatment. Nasal washings and aspirates are unacceptable for Xpert Xpress SARS-CoV-2/FLU/RSV testing.  Fact Sheet for Patients: EntrepreneurPulse.com.au  Fact Sheet for Healthcare Providers: IncredibleEmployment.be  This test is not yet approved or cleared by the [place] FDA and has been authorized for detection and/or diagnosis of SARS-CoV-2 by FDA under an Emergency Use Authorization (EUA). This EUA will remain in effect (meaning this test can be used) for the duration of the COVID-19 declaration under Section 564(b)(1) of the Act, 21 U.S.C. section 360bbb-3(b)(1), unless the authorization is terminated or revoked.  Performed at [facility], [place] [address], [place], [place] [address]   Surgical pcr screen     Status: None   Collection Time: 10/09/20  5:39 AM   Specimen: Nasal Mucosa; Nasal Swab  Result Value Ref Range Status   MRSA, PCR NEGATIVE NEGATIVE Final   Staphylococcus aureus NEGATIVE NEGATIVE Final    Comment: (NOTE) The Xpert SA Assay (FDA approved for NASAL specimens in patients 70 years of age and older), is one component of a comprehensive surveillance program. It is not intended to diagnose infection nor to guide or monitor treatment. Performed at [facility], [place] [address] [place], [place] [address]     Pertinent Lab seen by me: CBC Latest Ref Rng & Units 10/11/2020 10/10/2020 10/09/2020  WBC 4.0 - 10.5 K/uL 8.9 11.5(H) 11.1(H)  Hemoglobin 13.0 - 17.0 g/dL 7.1(L) 7.7(L) 9.8(L)  Hematocrit 39.0 - 52.0 % 21.3(L) 23.7(L) 27.9(L)  Platelets 150 - 400 K/uL 218 212 206   CMP Latest  Ref Rng & Units 10/11/2020 10/10/2020 10/09/2020  Glucose 70 - 99 mg/dL 85 123(H) 88  BUN 6 - 20 mg/dL _0 Creatinine 0.61 - 1.24 mg/dL 0.57(L) 0.65 0.58(L)  Sodium 135 - 145 mmol/L 130(L) 128(L) 129(L)  Potassium 3.5 - 5.1 mmol/L 4.0 4.1 4.0  Chloride 98 - 111 mmol/L 99 100 97(L)  CO2 22 - 32 mmol/L _1 Calcium 8.9 - 10.3 mg/dL 8.5(L) 8.4(L) 8.7(L)  Total Protein 6.5 - 8.1 g/dL 6.6 6.4(L) -  Total Bilirubin 0.3 - 1.2 mg/dL 2.4(H) 3.1(H) -  Alkaline Phos 38 - 126 U/L 275(H) 253(H) -  AST 15 - 41 U/L 63(H) 48(H) -  ALT 0 - 44 U/L 37 32 -     Pertinent Imagings/Other Imagings Plain films and CT images have been personally visualized and interpreted; radiology reports have been reviewed. Decision making incorporated into the Impression / Recommendations.  MRI RT tibia/fibula   IMPRESSION: Extensive soft tissue swelling of the thigh and lower leg as can be seen with severe cellulitis.   Myofasciitis in the thigh involving the anterior compartment and to a lesser degree the posterior compartment.   Myofasciitis in the lower leg involving the anterior and posterior compartments as described above, with possible 1.5 x 0.6 cm intramuscular collection/developing abscess in the anterior compartment.   Partially visualized, moderate to large sized joint effusion of the right knee, with fluid collecting posteriorly, likely within a large Baker cyst and along the popliteus. Periarticular marrow signal changes predominantly in the medial femoral condyle and tibial plateau concerning for septic arthritis with osteomyelitis or severe degenerative arthritis. Note there is marked image degradation at the level of the knee.  MRI RT femur 10/08/20 IMPRESSION: Extensive soft tissue swelling of the thigh and lower leg as can be seen with severe cellulitis.   Myofasciitis in the thigh  involving the anterior compartment and to a lesser degree the posterior compartment.    Myofasciitis in the lower leg involving the anterior and posterior compartments as described above, with possible 1.5 x 0.6 cm intramuscular collection/developing abscess in the anterior compartment.   Partially visualized, moderate to large sized joint effusion of the right knee, with fluid collecting posteriorly, likely within a large Baker cyst and along the popliteus. Periarticular marrow signal changes predominantly in the medial femoral condyle and tibial plateau concerning for septic arthritis with osteomyelitis or severe degenerative arthritis. Note there is marked image degradation at the level of the knee.  I have spent more than 70  minutes for this patient encounter including review of prior medical records with greater than 50% of time being face to face and coordination of their care.  Electronically signed by:   Rosiland Oz, MD Infectious Disease Physician [facility] for Infectious Disease Pager: [phone]

## 2020-10-12 LAB — CBC WITH DIFFERENTIAL/PLATELET
Abs Immature Granulocytes: 0.17 10*3/uL — ABNORMAL HIGH (ref 0.00–0.07)
Basophils Absolute: 0.1 10*3/uL (ref 0.0–0.1)
Basophils Relative: 1 %
Eosinophils Absolute: 0.1 10*3/uL (ref 0.0–0.5)
Eosinophils Relative: 1 %
HCT: 19.2 % — ABNORMAL LOW (ref 39.0–52.0)
Hemoglobin: 6.5 g/dL — CL (ref 13.0–17.0)
Immature Granulocytes: 2 %
Lymphocytes Relative: 20 %
Lymphs Abs: 2 10*3/uL (ref 0.7–4.0)
MCH: 35.9 pg — ABNORMAL HIGH (ref 26.0–34.0)
MCHC: 33.9 g/dL (ref 30.0–36.0)
MCV: 106.1 fL — ABNORMAL HIGH (ref 80.0–100.0)
Monocytes Absolute: 1.7 10*3/uL — ABNORMAL HIGH (ref 0.1–1.0)
Monocytes Relative: 17 %
Neutro Abs: 6.2 10*3/uL (ref 1.7–7.7)
Neutrophils Relative %: 59 %
Platelets: 240 10*3/uL (ref 150–400)
RBC: 1.81 MIL/uL — ABNORMAL LOW (ref 4.22–5.81)
RDW: 14.5 % (ref 11.5–15.5)
WBC: 10.2 10*3/uL (ref 4.0–10.5)
nRBC: 0 % (ref 0.0–0.2)

## 2020-10-12 LAB — CULTURE, BLOOD (ROUTINE X 2)
Culture: NO GROWTH
Culture: NO GROWTH
Special Requests: ADEQUATE

## 2020-10-12 LAB — HEMOGLOBIN AND HEMATOCRIT, BLOOD
HCT: 21.9 % — ABNORMAL LOW (ref 39.0–52.0)
Hemoglobin: 7.4 g/dL — ABNORMAL LOW (ref 13.0–17.0)

## 2020-10-12 LAB — ABO/RH: ABO/RH(D): A POS

## 2020-10-12 LAB — PREPARE RBC (CROSSMATCH)

## 2020-10-12 MED ORDER — SODIUM CHLORIDE 0.9% IV SOLUTION: Freq: Once | INTRAVENOUS | Status: DC

## 2020-10-12 NOTE — Progress Notes (Signed)
Date and time results received: 10/12/20 0408 am (use smartphrase ".now" to insert current time)  Test: Hgb  Critical Value: Hgb 6.5  Name of Provider Notified:  X,Blount, NP  Orders Received? Or Actions Taken?: please see new orders for Type and screen and Blood Transfusions  Orders was relayed to day shift nurse.

## 2020-10-12 NOTE — Progress Notes (Signed)
PT Cancellation Note  Patient Details Name: ESTELL DILLINGER MRN: [id number] DOB: 1960-08-16   Cancelled Treatment:     PT deferred this am.  Pt with Hgb of 6.5 and with transfusion ordered.  Will follow.   Lillyan Hitson 10/12/2020, 11:35 AM

## 2020-10-12 NOTE — Progress Notes (Signed)
None PROGRESS NOTE    Kyle Pitts  VHQ:[id number] DOB: 01-29-1961 DOA: 10/07/2020 PCP: [name], MD   Brief Narrative:  Kyle Pitts is a 60 y.o. 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  Patient presented to the ER with worsening swelling of the right lower extremity from the thigh all the way down.  Associated with pain redness and some subjective fever.  On arrival in the ER he appears to have significant cellulitis involving most of the right lower extremity.  He was hemodynamically stable. White count 14.0, Sodium 123 potassium.  Patient was admitted to hospitalist rest, orthopedics consulted.  Further hospital course as below.  Assessment & Plan:   Principal Problem:   Cellulitis and abscess of right leg Active Problems:   Septic arthritis of knee (HCC)   Hyponatremia   Leucocytosis  #1 cellulitis of the right lower extremity S/p arthroscopic incision and drainage for septic right knee 09/27/2020 during recent hospitalization admitted with right lower extremity cellulitis/myofascial disc of thigh and lower leg/abscess anterior compartment of lower leg/moderate to large joint effusion of right knee with septic arthritis:  Failed IV Rocephin.  Started on vancomycin and cefepime.  Ortho consulted.  They obtained MRI right lower extremity which shows extensive severe cellulitis and mild fasciitis involving anterior compartment and posterior compartment as well as lower leg with possible intramuscular abscess in anterior compartment in the lower leg.  Moderate to large sized joint effusion on the right knee with possible septic arthritis and osteomyelitis.  Patient's erythema/cellulitis had improved somewhat.  He underwent following  procedures by Ortho on 10/09/2020. 1.  Right knee arthroscopic I&D for septic arthritis 2.  Right knee arthroscopic major synovectomy 3.  Right leg anterior and lateral compartment fasciotomy 4.  Right lower leg irrigation debridement of skin, subcutaneous tissue, muscle, and fascia for infection. Ortho consulted ID and they recommended IV Rocephin for total of 6 weeks consistently since oh visit.  Patient's pain and erythema has improved.  He is looking forward to be discharging tomorrow.  # 2.  Elevated bilirubin and alkaline phosphatase: Improving.  Monitor.  #3  Chronic hyponatremia: Stable at his baseline.  #4.  Acute blood loss anemia: Hemoglobin dropped to 6.5, secondary to surgical procedure.  Transfusing 1 unit PRBC.  Repeat CBC in the morning.    DVT prophylaxis: enoxaparin (LOVENOX) injection 40 mg Start: 10/10/20 0800 SCDs Start: 10/09/20 2012   Code Status: Full Code  Family Communication:  None present at bedside.  Plan of care discussed with patient in length and he verbalized understanding and agreed with it.  Status is: Inpatient  Remains inpatient appropriate because:IV treatments appropriate due to intensity of illness or inability to take PO  Dispo: The patient is from: Home              Anticipated d/c is to: Home              Patient currently is not medically stable to d/c.   Difficult to place patient No        Estimated body mass index is 29.84 kg/m as calculated from the following:   Height as of this encounter: 6' (1.829 m).   Weight as of this encounter: 99.8 kg.  Nutritional status:               Consultants:  Orthopedics ID  Procedures:  1.  Right knee arthroscopic I&D for septic arthritis 2.  Right knee arthroscopic major synovectomy 3.  Right leg anterior and lateral compartment fasciotomy 4.  Right lower leg irrigation debridement of skin, subcutaneous tissue, muscle, and fascia for infection.  Antimicrobials:   Anti-infectives (From admission, onward)    Start     Dose/Rate Route Frequency Ordered Stop   10/11/20 1400  cefTRIAXone (ROCEPHIN) 2 g in sodium chloride 0.9 % 100 mL IVPB        2 g 200 mL/hr over 30 Minutes Intravenous Every 24 hours 10/11/20 1226     10/11/20 0000  cefTRIAXone (ROCEPHIN) IVPB        2 g Intravenous Every 24 hours 10/11/20 1851 11/20/20 2359   10/08/20 1000  vancomycin (VANCOREADY) IVPB 1250 mg/250 mL  Status:  Discontinued        1,250 mg 166.7 mL/hr over 90 Minutes Intravenous Every 12 hours 10/07/20 2127 10/10/20 1356   10/08/20 0800  vancomycin (VANCOREADY) IVPB 1250 mg/250 mL  Status:  Discontinued        1,250 mg 166.7 mL/hr over 90 Minutes Intravenous Every 12 hours 10/07/20 2018 10/07/20 2127   10/08/20 0600  ceFEPIme (MAXIPIME) 2 g in sodium chloride 0.9 % 100 mL IVPB  Status:  Discontinued        2 g 200 mL/hr over 30 Minutes Intravenous Every 8 hours 10/07/20 2018 10/11/20 1226   10/07/20 2130  vancomycin (VANCOREADY) IVPB 2000 mg/400 mL        2,000 mg 200 mL/hr over 120 Minutes Intravenous  Once 10/07/20 2127 10/08/20 0024   10/07/20 2030  vancomycin (VANCOCIN) IVPB 1000 mg/200 mL premix  Status:  Discontinued        1,000 mg 200 mL/hr over 60 Minutes Intravenous  Once 10/07/20 2018 10/07/20 2127   10/07/20 2030  ceFEPIme (MAXIPIME) 2 g in sodium chloride 0.9 % 100 mL IVPB        2 g 200 mL/hr over 30 Minutes Intravenous  Once 10/07/20 2018 10/07/20 2123   10/07/20 2015  cefTRIAXone (ROCEPHIN) 1 g in sodium chloride 0.9 % 100 mL IVPB  Status:  Discontinued        1 g 200 mL/hr over 30 Minutes Intravenous  Once 10/07/20 2003 10/07/20 2018   10/07/20 1945  cefTRIAXone (ROCEPHIN) 2 g in sodium chloride 0.9 % 100 mL IVPB  Status:  Discontinued        2 g 200 mL/hr over 30 Minutes Intravenous  Once 10/07/20 1942 10/07/20 2003   10/07/20 1915  cefTRIAXone (ROCEPHIN) 1 g in sodium chloride 0.9 % 100 mL IVPB  Status:  Discontinued        1 g 200 mL/hr  over 30 Minutes Intravenous  Once 10/07/20 1902 10/07/20 2019   10/07/20 1915  vancomycin (VANCOCIN) IVPB 1000 mg/200 mL premix  Status:  Discontinued        1,000 mg 200 mL/hr over 60 Minutes Intravenous  Once 10/07/20 1902 10/07/20 2127          Subjective: Patient seen and examined.  He is feeling much better, pain controlled.  No new complaint.  Objective: Vitals:   10/11/20 1254 10/11/20 1347 10/11/20 2136 10/12/20 0615  BP:  (!) 146/73 (!) 146/81 (!) 147/89  Pulse:  84 84 82  Resp:  18 18 18   Temp:  98 F (36.7 C) 98.9 F (37.2 C) 98.4 F (36.9 C)  TempSrc:  Oral Oral Oral  SpO2: 98% 95% 100% 100%  Weight:      Height:        Intake/Output Summary (Last 24 hours) at 10/12/2020 1027 Last data filed at 10/12/2020 1000 Gross per 24 hour  Intake 3323.54 ml  Output 2240 ml  Net 1083.54 ml    Filed Weights   10/07/20 1716 10/09/20 1302  Weight: 99.8 kg 99.8 kg    Examination:  General exam: Appears calm and comfortable  Respiratory system: Clear to auscultation. Respiratory effort normal. Cardiovascular system: S1 & S2 heard, RRR. No JVD, murmurs, rubs, gallops or clicks. No pedal edema. Gastrointestinal system: Abdomen is nondistended, soft and nontender. No organomegaly or masses felt. Normal bowel sounds heard. Central nervous system: Alert and oriented. No focal neurological deficits. Extremities: Symmetric 5 x 5 power. Skin: Dressing in the right lower extremity. Psychiatry: Judgement and insight appear normal. Mood & affect appropriate.    Data Reviewed: I have personally reviewed following labs and imaging studies  CBC: Recent Labs  Lab 10/07/20 1838 10/08/20 0130 10/08/20 0539 10/09/20 0336 10/10/20 0315 10/11/20 0459 10/12/20 0323  WBC 15.6*   < > 17.5* 11.1* 11.5* 8.9 10.2  NEUTROABS 13.0*  --   --  7.2 7.7 7.1 6.2  HGB 11.0*   < > 11.3* 9.8* 7.7* 7.1* 6.5*  HCT 31.0*   < > 31.6* 27.9* 23.7* 21.3* 19.2*  MCV 100.3*   < > 100.0 100.7*  106.3* 106.0* 106.1*  PLT 181   < > 206 206 212 218 240   < > = values in this interval not displayed.    Basic Metabolic Panel: Recent Labs  Lab 10/07/20 1838 10/08/20 0130 10/08/20 0539 10/09/20 0336 10/10/20 0315 10/11/20 0459  NA 123*  --  128* 129* 128* 130*  K 4.1  --  4.3 4.0 4.1 4.0  CL 89*  --  95* 97* 100 99  CO2 28  --  28 27 26 27   GLUCOSE 79  --  86 88 123* 85  BUN 25*  --  16 15 17 11   CREATININE 0.95 0.85 0.76 0.58* 0.65 0.57*  CALCIUM 8.5*  --  8.8* 8.7* 8.4* 8.5*    GFR: Estimated Creatinine Clearance: 123.1 mL/min (A) (by C-G formula based on SCr of 0.57 mg/dL (L)). Liver Function Tests: Recent Labs  Lab 10/08/20 0539 10/10/20 0315 10/11/20 0459  AST 59* 48* 63*  ALT 43 32 37  ALKPHOS 341* 253* 275*  BILITOT 4.4* 3.1* 2.4*  PROT 7.1 6.4* 6.6  ALBUMIN 2.2* 1.7* 1.7*    No results for input(s): LIPASE, AMYLASE in the last 168 hours. No results for input(s): AMMONIA in the last 168 hours. Coagulation Profile: No results for input(s): INR, PROTIME in the last 168 hours. Cardiac Enzymes: No results for input(s): CKTOTAL, CKMB, CKMBINDEX, TROPONINI in the last 168 hours. BNP (last 3 results) No results for input(s): PROBNP in the last 8760 hours. HbA1C: No results for input(s): HGBA1C in the last 72 hours. CBG: No results for input(s): GLUCAP in the last 168 hours. Lipid Profile: No results for input(s): CHOL, HDL, LDLCALC, TRIG, CHOLHDL, LDLDIRECT in the last 72 hours. Thyroid Function Tests: No results for input(s): TSH, T4TOTAL, FREET4, T3FREE, THYROIDAB in the last 72 hours. Anemia Panel: No results for input(s): VITAMINB12, FOLATE, FERRITIN, TIBC, IRON, RETICCTPCT in the last 72 hours. Sepsis Labs: Recent Labs  Lab  10/07/20 1848 10/08/20 0130  LATICACIDVEN 0.9 1.0     Recent Results (from the past 240 hour(s))  Culture, blood (routine x 2)     Status: None   Collection Time: 10/07/20  6:25 PM   Specimen: BLOOD LEFT FOREARM   Result Value Ref Range Status   Specimen Description   Final    BLOOD LEFT FOREARM Performed at [facility], [address] [place], [place] [address]    Special Requests   Final    BOTTLES DRAWN AEROBIC ONLY Blood Culture results may not be optimal due to an inadequate volume of blood received in culture bottles Performed at [facility], [address] [place], [place] [address]    Culture   Final    NO GROWTH 5 DAYS Performed at [facility], [address] [place], [place] [address]    Report Status 10/12/2020 FINAL  Final  Culture, blood (routine x 2)     Status: None   Collection Time: 10/07/20  6:35 PM   Specimen: Site Not Specified; Blood  Result Value Ref Range Status   Specimen Description   Final    SITE NOT SPECIFIED Performed at [facility], [address] [place], [place] [address]    Special Requests   Final    BOTTLES DRAWN AEROBIC AND ANAEROBIC Blood Culture adequate volume Performed at [facility], [address] [place], [place] [address]    Culture   Final    NO GROWTH 5 DAYS Performed at [facility], [address] [place], [place] [address]    Report Status 10/12/2020 FINAL  Final  Resp Panel by RT-PCR (Flu A&B, Covid) Nasopharyngeal Swab     Status: None   Collection Time: 10/07/20  6:38 PM   Specimen: Nasopharyngeal Swab; Nasopharyngeal(NP) swabs in vial transport medium  Result Value Ref Range Status   SARS Coronavirus 2 by RT PCR NEGATIVE NEGATIVE Final    Comment: (NOTE) SARS-CoV-2 target nucleic acids are NOT DETECTED.  The SARS-CoV-2 RNA is generally detectable in upper respiratory specimens during the acute phase of infection. The lowest concentration of SARS-CoV-2 viral copies this assay can detect is 138 copies/mL. A negative result does not preclude SARS-Cov-2 infection and should not be used as the sole basis for treatment or other patient  management decisions. A negative result may occur with  improper specimen collection/handling, submission of specimen other than nasopharyngeal swab, presence of viral mutation(s) within the areas targeted by this assay, and inadequate number of viral copies(<138 copies/mL). A negative result must be combined with clinical observations, patient history, and epidemiological information. The expected result is Negative.  Fact Sheet for Patients:  BloggerCourse.comhttps://www.fda.gov/media/152166/download  Fact Sheet for Healthcare Providers:  SeriousBroker.ithttps://www.fda.gov/media/152162/download  This test is no t yet approved or cleared by the [place] FDA and  has been authorized for detection and/or diagnosis of SARS-CoV-2 by FDA under an Emergency Use Authorization (EUA). This EUA will remain  in effect (meaning this test can be used) for the duration of the COVID-19 declaration under Section 564(b)(1) of the Act, 21 U.S.C.section 360bbb-3(b)(1), unless the authorization is terminated  or revoked sooner.       Influenza A by PCR NEGATIVE NEGATIVE Final   Influenza B by PCR NEGATIVE NEGATIVE Final    Comment: (NOTE) The Xpert Xpress SARS-CoV-2/FLU/RSV plus assay is intended as an aid in the diagnosis of influenza from Nasopharyngeal swab specimens and should not be  used as a sole basis for treatment. Nasal washings and aspirates are unacceptable for Xpert Xpress SARS-CoV-2/FLU/RSV testing.  Fact Sheet for Patients: BloggerCourse.com  Fact Sheet for Healthcare Providers: SeriousBroker.it  This test is not yet approved or cleared by the [place] FDA and has been authorized for detection and/or diagnosis of SARS-CoV-2 by FDA under an Emergency Use Authorization (EUA). This EUA will remain in effect (meaning this test can be used) for the duration of the COVID-19 declaration under Section 564(b)(1) of the Act, 21 U.S.C. section 360bbb-3(b)(1),  unless the authorization is terminated or revoked.  Performed at [facility], [address], [place], [place] [address]   Surgical pcr screen     Status: None   Collection Time: 10/09/20  5:39 AM   Specimen: Nasal Mucosa; Nasal Swab  Result Value Ref Range Status   MRSA, PCR NEGATIVE NEGATIVE Final   Staphylococcus aureus NEGATIVE NEGATIVE Final    Comment: (NOTE) The Xpert SA Assay (FDA approved for NASAL specimens in patients 83 years of age and older), is one component of a comprehensive surveillance program. It is not intended to diagnose infection nor to guide or monitor treatment. Performed at [facility], [address] [place], [place] [address]        Radiology Studies: No results found.  Scheduled Meds:  sodium chloride   Intravenous Once   celecoxib  100 mg Oral BID   Chlorhexidine Gluconate Cloth  6 each Topical Daily   docusate sodium  100 mg Oral BID   enoxaparin (LOVENOX) injection  40 mg Subcutaneous Q24H   sodium chloride flush  10-40 mL Intracatheter Q12H   Continuous Infusions:  sodium chloride 100 mL/hr at 10/11/20 2058   cefTRIAXone (ROCEPHIN)  IV Stopped (10/11/20 1515)     LOS: 5 days   Time spent: 27 minutes   Hughie Closs, MD [facility]  10/12/2020, 10:27 AM   How to contact the [facility] Attending or Consulting provider 7A - 7P or covering provider during after hours 7P -7A, for this patient?  Check the care team in [facility] and look for a) attending/consulting [facility] provider listed and b) the [facility] team listed. Page or secure chat 7A-7P. Log into [email or web address] and use [facility]'s universal password to access. If you do not have the password, please contact the hospital operator. Locate the [facility] provider you are looking for under [facility] and page to a number that you can be directly reached. If you still have difficulty reaching the provider, please page the [facility] (Director on Call) for the  Hospitalists listed on amion for assistance.

## 2020-10-12 NOTE — Progress Notes (Signed)
Subjective: 3 Days Post-Op Procedure(s) (LRB): ARTHROSCOPY KNEE,ARTHROSCOPIC I & D, CALF ABSCESS (Right) IRRIGATION AND DEBRIDEMENT EXTREMITY (Right) Patient reports pain as mild.   Patient seen in rounds for Dr. [name]. Patient is well, and has had no acute complaints or problems. No acute events overnight. Voiding without difficulty. He reports his knee is feeling well at rest.   Objective: Vital signs in last 24 hours: Temp:  [98 F (36.7 C)-98.9 F (37.2 C)] 98.4 F (36.9 C) (06/25 0615) Pulse Rate:  [82-84] 82 (06/25 0615) Resp:  [18] 18 (06/25 0615) BP: (146-147)/(73-89) 147/89 (06/25 0615) SpO2:  [95 %-100 %] 100 % (06/25 0615)  Intake/Output from previous day:  Intake/Output Summary (Last 24 hours) at 10/12/2020 1113 Last data filed at 10/12/2020 1000 Gross per 24 hour  Intake 3323.54 ml  Output 2240 ml  Net 1083.54 ml     Intake/Output this shift: Total I/O In: 480 [P.O.:480] Out: 350 [Urine:350]  Labs: Recent Labs    10/10/20 0315 10/11/20 0459 10/12/20 0323  HGB 7.7* 7.1* 6.5*   Recent Labs    10/11/20 0459 10/12/20 0323  WBC 8.9 10.2  RBC 2.01* 1.81*  HCT 21.3* 19.2*  PLT 218 240   Recent Labs    10/10/20 0315 10/11/20 0459  NA 128* 130*  K 4.1 4.0  CL 100 99  CO2 26 27  BUN 17 11  CREATININE 0.65 0.57*  GLUCOSE 123* 85  CALCIUM 8.4* 8.5*   No results for input(s): LABPT, INR in the last 72 hours.  Exam: General - Patient is Alert and Oriented Extremity - Neurologically intact Sensation intact distally Intact pulses distally Dorsiflexion/Plantar flexion intact Dressing - dressing C/D/I, I did remove all of the dressings to inspect the skin. Appears to have lessening erythema about the distal shin. Incisions intact, Minimal bloody drainage from proximal lateral portal site, no other drainage. Leg re-dressed.         Motor Function - intact, moving foot and toes well on exam.   History reviewed. No pertinent past medical  history.  Assessment/Plan: 3 Days Post-Op Procedure(s) (LRB): ARTHROSCOPY KNEE,ARTHROSCOPIC I & D, CALF ABSCESS (Right) IRRIGATION AND DEBRIDEMENT EXTREMITY (Right) Principal Problem:   Cellulitis and abscess of right leg Active Problems:   Septic arthritis of knee (HCC)   Hyponatremia   Leucocytosis  Estimated body mass index is 29.84 kg/m as calculated from the following:   Height as of this encounter: 6' (1.829 m).   Weight as of this encounter: 99.8 kg.     Advance diet Up with therapy Infectious disease was consulted for appropriate antibiotics while inpatient and for discharge . Dressings were changed so as to inspect the skin.  Updated photos above.   There appears to be continued improvement in erythema.   If he has continued improvement would likely be okay for discharge Sunday or Monday.  I would prefer to see continued decrease in edema and erythema prior to discharge.  Weekend team can consider discharge Sunday if continued improvement with IV ceftriaxone upon discharge per pharmacy consult note.   PICC line still in place.   weightbearing Status: WBAT, ROM as tolerated. DVT Prophylaxis: Lovenox while inpatient, aspirin 81 mg upon discharge for DVT prophylaxis   Upon discharge would like to follow-up in 1 week for repeat wound check.  Sutures will remain in for 2 to 3 weeks.  Dressing will be changed as needed when saturated with nonadherent dressing, ABD pads, Kling, and Ace.    Morrie Sheldon  [facility] [phone] 10/12/2020, 11:13 AM

## 2020-10-13 LAB — CBC WITH DIFFERENTIAL/PLATELET
Abs Immature Granulocytes: 0.13 10*3/uL — ABNORMAL HIGH (ref 0.00–0.07)
Abs Immature Granulocytes: NONE SEEN 10*3/uL (ref 0.00–0.07)
Band Neutrophils: 0 %
Basophils Absolute: 0.1 10*3/uL (ref 0.0–0.1)
Basophils Relative: 1 %
Basophils Relative: 0 %
Blasts: NONE SEEN %
Eosinophils Absolute: 0.1 10*3/uL (ref 0.0–0.5)
Eosinophils Relative: 1 %
Eosinophils Relative: 1 %
HCT: 21.9 % — ABNORMAL LOW (ref 39.0–52.0)
HCT: 17.6 % — ABNORMAL LOW (ref 39.0–52.0)
Hemoglobin: 7.1 g/dL — ABNORMAL LOW (ref 13.0–17.0)
Hemoglobin: 5.8 g/dL — CL (ref 13.0–17.0)
Immature Granulocytes: 1 %
Immature Granulocytes: NONE SEEN %
Lymphocytes Relative: 22 %
Lymphocytes Relative: 4 %
Lymphs Abs: 2.1 10*3/uL (ref 0.7–4.0)
MCH: 33.5 pg (ref 26.0–34.0)
MCH: 33.9 pg (ref 26.0–34.0)
MCHC: 32.4 g/dL (ref 30.0–36.0)
MCHC: 33 g/dL (ref 30.0–36.0)
MCV: 103.3 fL — ABNORMAL HIGH (ref 80.0–100.0)
MCV: 102.9 fL — ABNORMAL HIGH (ref 80.0–100.0)
Metamyelocytes Relative: NONE SEEN %
Monocytes Absolute: 1.6 10*3/uL — ABNORMAL HIGH (ref 0.1–1.0)
Monocytes Relative: 16 %
Monocytes Relative: 7 %
Myelocytes: NONE SEEN %
Neutro Abs: 5.9 10*3/uL (ref 1.7–7.7)
Neutrophils Relative %: 59 %
Neutrophils Relative %: 88 %
Platelets: 259 10*3/uL (ref 150–400)
Platelets: 194 10*3/uL (ref 150–400)
Promyelocytes Relative: NONE SEEN %
RBC Morphology: NORMAL
RBC: 2.12 MIL/uL — ABNORMAL LOW (ref 4.22–5.81)
RBC: 1.71 MIL/uL — ABNORMAL LOW (ref 4.22–5.81)
RDW: 19.7 % — ABNORMAL HIGH (ref 11.5–15.5)
RDW: 19.1 % — ABNORMAL HIGH (ref 11.5–15.5)
WBC Morphology: NORMAL
WBC: 9.9 10*3/uL (ref 4.0–10.5)
WBC: 7.9 10*3/uL (ref 4.0–10.5)
nRBC: 0 % (ref 0.0–0.2)
nRBC: 0 % (ref 0.0–0.2)
nRBC: NONE SEEN /100 WBC

## 2020-10-13 LAB — BASIC METABOLIC PANEL
Anion gap: 4 — ABNORMAL LOW (ref 5–15)
BUN: 9 mg/dL (ref 6–20)
CO2: 26 mmol/L (ref 22–32)
Calcium: 8.3 mg/dL — ABNORMAL LOW (ref 8.9–10.3)
Chloride: 99 mmol/L (ref 98–111)
Creatinine, Ser: 0.58 mg/dL — ABNORMAL LOW (ref 0.61–1.24)
GFR, Estimated: >60 mL/min (ref >60)
Glucose, Bld: 78 mg/dL (ref 70–99)
Potassium: 3.5 mmol/L (ref 3.5–5.1)
Sodium: 129 mmol/L — ABNORMAL LOW (ref 135–145)

## 2020-10-13 LAB — PREPARE RBC (CROSSMATCH)

## 2020-10-13 MED ORDER — SODIUM CHLORIDE 0.9% IV SOLUTION: Freq: Once | INTRAVENOUS | Status: DC

## 2020-10-13 NOTE — Progress Notes (Signed)
None PROGRESS NOTE    Kyle Pitts  ZOX:[id number] DOB: [birth year] DOA: 10/07/2020 PCP: [name], MD   Brief Narrative:  Kyle Pitts is a 60 y.o. 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  Patient presented to the ER with worsening swelling of the right lower extremity from the thigh all the way down.  Associated with pain redness and some subjective fever.  On arrival in the ER he appears to have significant cellulitis involving most of the right lower extremity.  He was hemodynamically stable. White count 14.0, Sodium 123 potassium.  Patient was admitted to hospitalist rest, orthopedics consulted.  Further hospital course as below.  Assessment & Plan:   Principal Problem:   Cellulitis and abscess of right leg Active Problems:   Septic arthritis of knee (HCC)   Hyponatremia   Leucocytosis   #1 cellulitis of the right lower extremity S/p arthroscopic incision and drainage for septic right knee 09/27/2020 during recent hospitalization admitted with right lower extremity cellulitis/myofascial disc of thigh and lower leg/abscess anterior compartment of lower leg/moderate to large joint effusion of right knee with septic arthritis:  Failed IV Rocephin.  Started on vancomycin and cefepime.  Ortho consulted.  They obtained MRI right lower extremity which shows extensive severe cellulitis and mild fasciitis involving anterior compartment and posterior compartment as well as lower leg with possible intramuscular abscess in anterior compartment in the lower leg.  Moderate to large sized joint effusion on the right knee with possible septic arthritis and osteomyelitis.  Patient's erythema/cellulitis had improved somewhat.  He underwent following  procedures by Ortho on 10/09/2020. 1.  Right knee arthroscopic I&D for septic arthritis 2.  Right knee arthroscopic major synovectomy 3.  Right leg anterior and lateral compartment fasciotomy 4.  Right lower leg irrigation debridement of skin, subcutaneous tissue, muscle, and fascia for infection. Ortho consulted ID and they recommended IV Rocephin for total of 6 weeks consistently since OR visit.  Patient's pain and erythema has improved however he still has pretty significant edema +3 in right lower extremity.  His hemoglobin also dropped again to 7.1.  We will recheck at 5 PM and again in the morning.  If he drops less than 7, might need another transfusion.  Also due to significant edema, he think he should stay another night.  He is very eager to go home however he is willing to stay.  # 2.  Elevated bilirubin and alkaline phosphatase: Improving.  Monitor.  #3  Chronic hyponatremia: Stable at his baseline.  #4.  Acute blood loss anemia: Hemoglobin dropped to 6.5 on 10/12/2020 for which she received blood transfusion, hemoglobin improved to 7.4 but dropped to 7.1 again today.  Rechecking at 5 PM today.  Transfuse if less than 7.   DVT prophylaxis: enoxaparin (LOVENOX) injection 40 mg Start: 10/10/20 0800 SCDs Start: 10/09/20 2012   Code Status: Full Code  Family Communication:  None present at bedside.  Plan of care discussed with patient in length and he verbalized understanding and agreed with it.  Status is: Inpatient  Remains inpatient appropriate because:IV treatments appropriate due to intensity of illness or inability to take PO  Dispo: The patient is from: Home  Anticipated d/c is to: Home              Patient currently is not medically stable to d/c.   Difficult to place patient No        Estimated body mass index is 29.84 kg/m as calculated from the following:   Height as of this encounter: 6' (1.829 m).   Weight as of this encounter: 99.8 kg.       Nutritional status:               Consultants:  Orthopedics ID  Procedures:  1.  Right knee arthroscopic I&D for septic arthritis 2.  Right knee arthroscopic major synovectomy 3.  Right leg anterior and lateral compartment fasciotomy 4.  Right lower leg irrigation debridement of skin, subcutaneous tissue, muscle, and fascia for infection.  Antimicrobials:  Anti-infectives (From admission, onward)    Start     Dose/Rate Route Frequency Ordered Stop   10/11/20 1400  cefTRIAXone (ROCEPHIN) 2 g in sodium chloride 0.9 % 100 mL IVPB        2 g 200 mL/hr over 30 Minutes Intravenous Every 24 hours 10/11/20 1226     10/11/20 0000  cefTRIAXone (ROCEPHIN) IVPB        2 g Intravenous Every 24 hours 10/11/20 1851 11/20/20 2359   10/08/20 1000  vancomycin (VANCOREADY) IVPB 1250 mg/250 mL  Status:  Discontinued        1,250 mg 166.7 mL/hr over 90 Minutes Intravenous Every 12 hours 10/07/20 2127 10/10/20 1356   10/08/20 0800  vancomycin (VANCOREADY) IVPB 1250 mg/250 mL  Status:  Discontinued        1,250 mg 166.7 mL/hr over 90 Minutes Intravenous Every 12 hours 10/07/20 2018 10/07/20 2127   10/08/20 0600  ceFEPIme (MAXIPIME) 2 g in sodium chloride 0.9 % 100 mL IVPB  Status:  Discontinued        2 g 200 mL/hr over 30 Minutes Intravenous Every 8 hours 10/07/20 2018 10/11/20 1226   10/07/20 2130  vancomycin (VANCOREADY) IVPB 2000 mg/400 mL        2,000 mg 200 mL/hr over 120 Minutes Intravenous  Once 10/07/20 2127 10/08/20 0024   10/07/20 2030  vancomycin (VANCOCIN) IVPB 1000 mg/200 mL premix  Status:  Discontinued        1,000 mg 200 mL/hr over 60 Minutes Intravenous  Once 10/07/20 2018 10/07/20 2127   10/07/20 2030  ceFEPIme (MAXIPIME) 2 g in sodium chloride 0.9 % 100 mL IVPB        2 g 200 mL/hr over 30 Minutes Intravenous  Once 10/07/20 2018 10/07/20 2123   10/07/20 2015  cefTRIAXone (ROCEPHIN) 1 g in sodium chloride 0.9 % 100 mL IVPB  Status:  Discontinued        1 g 200  mL/hr over 30 Minutes Intravenous  Once 10/07/20 2003 10/07/20 2018   10/07/20 1945  cefTRIAXone (ROCEPHIN) 2 g in sodium chloride 0.9 % 100 mL IVPB  Status:  Discontinued        2 g 200 mL/hr over 30 Minutes Intravenous  Once 10/07/20 1942 10/07/20 2003   10/07/20 1915  cefTRIAXone (ROCEPHIN) 1 g in sodium chloride 0.9 % 100 mL IVPB  Status:  Discontinued        1 g 200 mL/hr over 30 Minutes Intravenous  Once 10/07/20 1902 10/07/20 2019   10/07/20 1915  vancomycin (VANCOCIN) IVPB 1000 mg/200 mL premix  Status:  Discontinued        1,000  mg 200 mL/hr over 60 Minutes Intravenous  Once 10/07/20 1902 10/07/20 2127          Subjective: Patient seen and examined.  He feels better, pain controlled.  No new complaint.  Objective: Vitals:   10/12/20 1340 10/12/20 1458 10/12/20 2210 10/13/20 0514  BP: 129/82 (!) 158/90 (!) 141/73 (!) 151/72  Pulse: 82 83 85 79  Resp: 18 18 18 18   Temp: 97.9 F (36.6 C) 98.4 F (36.9 C) 99.2 F (37.3 C) 98.6 F (37 C)  TempSrc: Oral Oral Oral Oral  SpO2: 98% 100% 98% 98%  Weight:      Height:        Intake/Output Summary (Last 24 hours) at 10/13/2020 1040 Last data filed at 10/13/2020 1000 Gross per 24 hour  Intake 2778 ml  Output 2500 ml  Net 278 ml    Filed Weights   10/07/20 1716 10/09/20 1302  Weight: 99.8 kg 99.8 kg    Examination:  General exam: Appears calm and comfortable  Respiratory system: Clear to auscultation. Respiratory effort normal. Cardiovascular system: S1 & S2 heard, RRR. No JVD, murmurs, rubs, gallops or clicks.  Gastrointestinal system: Abdomen is nondistended, soft and nontender. No organomegaly or masses felt. Normal bowel sounds heard. Central nervous system: Alert and oriented. No focal neurological deficits. Extremities: Symmetric 5 x 5 power. Skin: Dressing in the right lower extremity however he has pretty significant pitting edema at the ankle and somewhat exposed part of the right lower  extremity. Psychiatry: Judgement and insight appear normal. Mood & affect appropriate.   Data Reviewed: I have personally reviewed following labs and imaging studies  CBC: Recent Labs  Lab 10/09/20 0336 10/10/20 0315 10/11/20 0459 10/12/20 0323 10/12/20 2116 10/13/20 0500  WBC 11.1* 11.5* 8.9 10.2  --  9.9  NEUTROABS 7.2 7.7 7.1 6.2  --  5.9  HGB 9.8* 7.7* 7.1* 6.5* 7.4* 7.1*  HCT 27.9* 23.7* 21.3* 19.2* 21.9* 21.9*  MCV 100.7* 106.3* 106.0* 106.1*  --  103.3*  PLT 206 212 218 240  --  259    Basic Metabolic Panel: Recent Labs  Lab 10/08/20 0539 10/09/20 0336 10/10/20 0315 10/11/20 0459 10/13/20 0500  NA 128* 129* 128* 130* 129*  K 4.3 4.0 4.1 4.0 3.5  CL 95* 97* 100 99 99  CO2 28 27 26 27 26   GLUCOSE 86 88 123* 85 78  BUN 16 15 17 11 9   CREATININE 0.76 0.58* 0.65 0.57* 0.58*  CALCIUM 8.8* 8.7* 8.4* 8.5* 8.3*    GFR: Estimated Creatinine Clearance: 123.1 mL/min (A) (by C-G formula based on SCr of 0.58 mg/dL (L)). Liver Function Tests: Recent Labs  Lab 10/08/20 0539 10/10/20 0315 10/11/20 0459  AST 59* 48* 63*  ALT 43 32 37  ALKPHOS 341* 253* 275*  BILITOT 4.4* 3.1* 2.4*  PROT 7.1 6.4* 6.6  ALBUMIN 2.2* 1.7* 1.7*    No results for input(s): LIPASE, AMYLASE in the last 168 hours. No results for input(s): AMMONIA in the last 168 hours. Coagulation Profile: No results for input(s): INR, PROTIME in the last 168 hours. Cardiac Enzymes: No results for input(s): CKTOTAL, CKMB, CKMBINDEX, TROPONINI in the last 168 hours. BNP (last 3 results) No results for input(s): PROBNP in the last 8760 hours. HbA1C: No results for input(s): HGBA1C in the last 72 hours. CBG: No results for input(s): GLUCAP in the last 168 hours. Lipid Profile: No results for input(s): CHOL, HDL, LDLCALC, TRIG, CHOLHDL, LDLDIRECT in the last 72 hours. Thyroid  Function Tests: No results for input(s): TSH, T4TOTAL, FREET4, T3FREE, THYROIDAB in the last 72 hours. Anemia Panel: No results  for input(s): VITAMINB12, FOLATE, FERRITIN, TIBC, IRON, RETICCTPCT in the last 72 hours. Sepsis Labs: Recent Labs  Lab 10/07/20 1848 10/08/20 0130  LATICACIDVEN 0.9 1.0     Recent Results (from the past 240 hour(s))  Culture, blood (routine x 2)     Status: None   Collection Time: 10/07/20  6:25 PM   Specimen: BLOOD LEFT FOREARM  Result Value Ref Range Status   Specimen Description   Final    BLOOD LEFT FOREARM Performed at [facility], [address] [place], [place] [address]    Special Requests   Final    BOTTLES DRAWN AEROBIC ONLY Blood Culture results may not be optimal due to an inadequate volume of blood received in culture bottles Performed at [facility], [address] [place], [place] [address]    Culture   Final    NO GROWTH 5 DAYS Performed at [facility], [address] [place], [place] [address]    Report Status 10/12/2020 FINAL  Final  Culture, blood (routine x 2)     Status: None   Collection Time: 10/07/20  6:35 PM   Specimen: Site Not Specified; Blood  Result Value Ref Range Status   Specimen Description   Final    SITE NOT SPECIFIED Performed at [facility], [address] [place], [place] [address]    Special Requests   Final    BOTTLES DRAWN AEROBIC AND ANAEROBIC Blood Culture adequate volume Performed at [facility], [address] [place], [place] [address]    Culture   Final    NO GROWTH 5 DAYS Performed at [facility], [address] [place], [place] [address]    Report Status 10/12/2020 FINAL  Final  Resp Panel by RT-PCR (Flu A&B, Covid) Nasopharyngeal Swab     Status: None   Collection Time: 10/07/20  6:38 PM   Specimen: Nasopharyngeal Swab; Nasopharyngeal(NP) swabs in vial transport medium  Result Value Ref Range Status   SARS Coronavirus 2 by RT PCR NEGATIVE NEGATIVE Final    Comment: (NOTE) SARS-CoV-2 target nucleic acids are NOT  DETECTED.  The SARS-CoV-2 RNA is generally detectable in upper respiratory specimens during the acute phase of infection. The lowest concentration of SARS-CoV-2 viral copies this assay can detect is 138 copies/mL. A negative result does not preclude SARS-Cov-2 infection and should not be used as the sole basis for treatment or other patient management decisions. A negative result may occur with  improper specimen collection/handling, submission of specimen other than nasopharyngeal swab, presence of viral mutation(s) within the areas targeted by this assay, and inadequate number of viral copies(<138 copies/mL). A negative result must be combined with clinical observations, patient history, and epidemiological information. The expected result is Negative.  Fact Sheet for Patients:  BloggerCourse.comhttps://www.fda.gov/media/152166/download  Fact Sheet for Healthcare Providers:  SeriousBroker.ithttps://www.fda.gov/media/152162/download  This test is no t yet approved or cleared by the [place] FDA and  has been authorized for detection and/or diagnosis of SARS-CoV-2 by FDA under an Emergency Use Authorization (EUA). This EUA will remain  in effect (meaning this test can be used) for the duration of the COVID-19 declaration under Section 564(b)(1) of the Act, 21 U.S.C.section 360bbb-3(b)(1), unless the authorization is terminated  or revoked sooner.       Influenza A by PCR NEGATIVE NEGATIVE  Final   Influenza B by PCR NEGATIVE NEGATIVE Final    Comment: (NOTE) The Xpert Xpress SARS-CoV-2/FLU/RSV plus assay is intended as an aid in the diagnosis of influenza from Nasopharyngeal swab specimens and should not be used as a sole basis for treatment. Nasal washings and aspirates are unacceptable for Xpert Xpress SARS-CoV-2/FLU/RSV testing.  Fact Sheet for Patients: BloggerCourse.com  Fact Sheet for Healthcare Providers: SeriousBroker.it  This test is not yet  approved or cleared by the [place] FDA and has been authorized for detection and/or diagnosis of SARS-CoV-2 by FDA under an Emergency Use Authorization (EUA). This EUA will remain in effect (meaning this test can be used) for the duration of the COVID-19 declaration under Section 564(b)(1) of the Act, 21 U.S.C. section 360bbb-3(b)(1), unless the authorization is terminated or revoked.  Performed at [facility], [address], [place], [place] [address]   Surgical pcr screen     Status: None   Collection Time: 10/09/20  5:39 AM   Specimen: Nasal Mucosa; Nasal Swab  Result Value Ref Range Status   MRSA, PCR NEGATIVE NEGATIVE Final   Staphylococcus aureus NEGATIVE NEGATIVE Final    Comment: (NOTE) The Xpert SA Assay (FDA approved for NASAL specimens in patients 21 years of age and older), is one component of a comprehensive surveillance program. It is not intended to diagnose infection nor to guide or monitor treatment. Performed at [facility], [address], [place], [place] [address]        Radiology Studies: No results found.  Scheduled Meds:  sodium chloride   Intravenous Once   celecoxib  100 mg Oral BID   Chlorhexidine Gluconate Cloth  6 each Topical Daily   docusate sodium  100 mg Oral BID   enoxaparin (LOVENOX) injection  40 mg Subcutaneous Q24H   sodium chloride flush  10-40 mL Intracatheter Q12H   Continuous Infusions:  sodium chloride 100 mL/hr at 10/13/20 0933   cefTRIAXone (ROCEPHIN)  IV 2 g (10/12/20 1505)     LOS: 6 days   Time spent: 26 minutes   Hughie Closs, MD [facility]  10/13/2020, 10:40 AM   How to contact the [facility] Attending or Consulting provider 7A - 7P or covering provider during after hours 7P -7A, for this patient?  Check the care team in [facility] and look for a) attending/consulting TRH provider listed and b) the [facility] team listed. Page or secure chat 7A-7P. Log into [email or web address] and use  [place]'s universal password to access. If you do not have the password, please contact the hospital operator. Locate the [facility] provider you are looking for under [facility] and page to a number that you can be directly reached. If you still have difficulty reaching the provider, please page the [facility] (Director on Call) for the Hospitalists listed on amion for assistance.

## 2020-10-13 NOTE — Progress Notes (Signed)
Physical Therapy Treatment Patient Details Name: Kyle Pitts MRN: [id number] DOB: 19-Nov-1960 Today's Date: 10/13/2020    History of Present Illness Kyle Pitts is a 60 y.o. 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  Patient presented to the ER 10/07/20 with worsening swelling of the right lower extremity from the thigh all the way down. S/PARTHROSCOPY KNEE,ARTHROSCOPIC I & D, CALF ABSCESS  IRRIGATION AND DEBRIDEMENT EXTREMITY, fasciotomy anterior compartment on 6/ 22/22    PT Comments    Pt continues very cooperative and with noted improvement in activity tolerance and improvement in R heel contact with ambulation.  Pt with Hgb 7.1 this am but pt denies symptoms with activity.  Follow Up Recommendations  Home health PT     Equipment Recommendations  None recommended by PT    Recommendations for Other Services       Precautions / Restrictions Precautions Precautions: Fall Precaution Comments: impulsive Restrictions Weight Bearing Restrictions: No RLE Weight Bearing: Weight bearing as tolerated Other Position/Activity Restrictions: per ortho, no restrictions for ROM L knee    Mobility  Bed Mobility Overal bed mobility: Modified Independent Bed Mobility: Supine to Sit;Sit to Supine           General bed mobility comments: Increased time and use of bed rail but no physical assist    Transfers Overall transfer level: Needs assistance Equipment used: Rolling walker (2 wheeled) Transfers: Sit to/from Stand Sit to Stand: Min guard;Supervision         General transfer comment: for safety  Ambulation/Gait Ambulation/Gait assistance: Min guard Gait Distance (Feet): 120 Feet Assistive device: Rolling walker (2 wheeled) Gait Pattern/deviations: Step-to pattern;Decreased step length -  right;Decreased stance time - right;Antalgic Gait velocity: cues to slow pace for safety   General Gait Details: PWB on R with improved heel contact   Stairs             Wheelchair Mobility    Modified Rankin (Stroke Patients Only)       Balance Overall balance assessment: Mild deficits observed, not formally tested                                          Cognition Arousal/Alertness: Awake/alert Behavior During Therapy: WFL for tasks assessed/performed;Impulsive Overall Cognitive Status: Within Functional Limits for tasks assessed                                        Exercises      General Comments        Pertinent Vitals/Pain Pain Assessment: 0-10 Pain Score: 8  Pain Location: RLE Pain Descriptors / Indicators: Grimacing;Discomfort;Sore;Tightness;Guarding Pain Intervention(s): Limited activity within patient's tolerance;Monitored during session;Patient requesting pain meds-RN notified    Home Living                      Prior Function            PT Goals (current goals can now be found in the care plan section) Acute Rehab PT Goals Patient Stated Goal: go home PT Goal Formulation: With patient Time For Goal Achievement: 10/24/20 Potential to Achieve Goals: Good  Progress towards PT goals: Progressing toward goals    Frequency    Min 6X/week      PT Plan Current plan remains appropriate    Co-evaluation              AM-PAC PT "6 Clicks" Mobility   Outcome Measure  Help needed turning from your back to your side while in a flat bed without using bedrails?: A Little Help needed moving from lying on your back to sitting on the side of a flat bed without using bedrails?: A Little Help needed moving to and from a bed to a chair (including a wheelchair)?: A Little Help needed standing up from a chair using your arms (e.g., wheelchair or bedside chair)?: A Little Help needed to walk in hospital  room?: A Little Help needed climbing 3-5 steps with a railing? : A Lot 6 Click Score: 17    End of Session Equipment Utilized During Treatment: Gait belt Activity Tolerance: Patient limited by pain Patient left: in bed;with call bell/phone within reach;with bed alarm set Nurse Communication: Mobility status PT Visit Diagnosis: Other abnormalities of gait and mobility (R26.89);Difficulty in walking, not elsewhere classified (R26.2)     Time: 1050-1110 PT Time Calculation (min) (ACUTE ONLY): 20 min  Charges:  $Gait Training: 8-22 mins                     Mauro Kaufmann PT Acute Rehabilitation Services Pager [phone] Office [phone]    Jasani Dolney 10/13/2020, 12:49 PM

## 2020-10-14 LAB — CBC
HCT: 25.7 % — ABNORMAL LOW (ref 39.0–52.0)
Hemoglobin: 8.6 g/dL — ABNORMAL LOW (ref 13.0–17.0)
MCH: 32.7 pg (ref 26.0–34.0)
MCHC: 33.5 g/dL (ref 30.0–36.0)
MCV: 97.7 fL (ref 80.0–100.0)
Platelets: 261 10*3/uL (ref 150–400)
RBC: 2.63 MIL/uL — ABNORMAL LOW (ref 4.22–5.81)
RDW: 22 % — ABNORMAL HIGH (ref 11.5–15.5)
WBC: 9.6 10*3/uL (ref 4.0–10.5)
nRBC: 0 % (ref 0.0–0.2)

## 2020-10-14 LAB — CBC WITH DIFFERENTIAL/PLATELET
Abs Immature Granulocytes: 0.11 10*3/uL — ABNORMAL HIGH (ref 0.00–0.07)
Basophils Absolute: 0.1 10*3/uL (ref 0.0–0.1)
Basophils Relative: 1 %
Eosinophils Absolute: 0.1 10*3/uL (ref 0.0–0.5)
Eosinophils Relative: 1 %
HCT: 26.9 % — ABNORMAL LOW (ref 39.0–52.0)
Hemoglobin: 8.9 g/dL — ABNORMAL LOW (ref 13.0–17.0)
Immature Granulocytes: 1 %
Lymphocytes Relative: 22 %
Lymphs Abs: 2.2 10*3/uL (ref 0.7–4.0)
MCH: 32.5 pg (ref 26.0–34.0)
MCHC: 33.1 g/dL (ref 30.0–36.0)
MCV: 98.2 fL (ref 80.0–100.0)
Monocytes Absolute: 1.4 10*3/uL — ABNORMAL HIGH (ref 0.1–1.0)
Monocytes Relative: 14 %
Neutro Abs: 5.9 10*3/uL (ref 1.7–7.7)
Neutrophils Relative %: 61 %
Platelets: 270 10*3/uL (ref 150–400)
RBC: 2.74 MIL/uL — ABNORMAL LOW (ref 4.22–5.81)
RDW: 22.1 % — ABNORMAL HIGH (ref 11.5–15.5)
WBC: 9.8 10*3/uL (ref 4.0–10.5)
nRBC: 0 % (ref 0.0–0.2)

## 2020-10-14 MED ORDER — ASPIRIN EC 81 MG PO TBEC: 81.0000 mg | DELAYED_RELEASE_TABLET | Freq: Two times a day (BID) | ORAL | 0 refills | Status: AC

## 2020-10-14 MED ORDER — HEPARIN SOD (PORK) LOCK FLUSH 100 UNIT/ML IV SOLN
250.0000 [IU] | INTRAVENOUS | Status: AC | PRN
  Administered 2020-10-14: 250 [IU]
  Filled 2020-10-14: qty 2.5

## 2020-10-14 NOTE — Progress Notes (Signed)
Results of recent CBC called to MD. Rip Harbour to d/c pt - reviewed written d/c instructions w pt and all questions answered. Pt verbalized understanding. Pt d/c via w/c in stable condition w all belongings, including crutches and walker. HH will f/u w him tomorrow to start his IV Abxs

## 2020-10-14 NOTE — Progress Notes (Signed)
Physical Therapy Treatment Patient Details Name: Kyle Pitts MRN: [id number] DOB: 1960/08/29 Today's Date: 10/14/2020    History of Present Illness Kyle Pitts is a 60 y.o. 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  Patient presented to the ER 10/07/20 with worsening swelling of the right lower extremity from the thigh all the way down. S/PARTHROSCOPY KNEE,ARTHROSCOPIC I & D, CALF ABSCESS  IRRIGATION AND DEBRIDEMENT EXTREMITY, fasciotomy anterior compartment on 6/ 22/22    PT Comments    A&O x 3 Pt continues to be motivated to participate in PT session. He continues to show improvement with an increased activity tolerance with ambulating and stair negotiation this session. Verbal cueing was needed throughout stair training for correct sequencing. Educated pt on how to negotiate stairs with one rail and one crutch, as ascending and descending stairs with rail only pt was not stable. Pt continues to be impulsive, with cueing needed to slow down during ambulation. PT continues to recommend home health to improve safety and functional independence.    Follow Up Recommendations  Home health PT     Equipment Recommendations  Crutches;Rolling walker with 5" wheels    Recommendations for Other Services       Precautions / Restrictions Precautions Precautions: Fall Precaution Comments: impulsive Restrictions Weight Bearing Restrictions: Yes RLE Weight Bearing: Weight bearing as tolerated Other Position/Activity Restrictions: per ortho, no restrictions for ROM L knee    Mobility  Bed Mobility Overal bed mobility: Modified Independent Bed Mobility: Supine to Sit     Supine to sit: Supervision     General bed mobility comments: use of bed rail but no physical assist    Transfers Overall transfer  level: Needs assistance Equipment used: Rolling walker (2 wheeled) Transfers: Sit to/from Stand Sit to Stand: Min guard;Supervision Stand pivot transfers: Min guard       General transfer comment: for safety  Ambulation/Gait Ambulation/Gait assistance: Min guard Gait Distance (Feet): 120 Feet Assistive device: Rolling walker (2 wheeled) Gait Pattern/deviations: Step-to pattern;Decreased step length - right;Decreased stance time - right;Antalgic Gait velocity: cues to slow pace for safety   General Gait Details: PWB on R with improved heel contact   Stairs Stairs: Yes Stairs assistance: Min assist;Min guard Stair Management: One rail Left;Step to pattern;With crutches Number of Stairs: 12 General stair comments: cueing needed for proper stair sequencing   Wheelchair Mobility    Modified Rankin (Stroke Patients Only)       Balance Overall balance assessment: Mild deficits observed, not formally tested                                          Cognition Arousal/Alertness: Awake/alert Behavior During Therapy: WFL for tasks assessed/performed;Impulsive Overall Cognitive Status: Within Functional Limits for tasks assessed                                        Exercises      General Comments        Pertinent Vitals/Pain Pain Assessment: No/denies pain    Home Living  Prior Function            PT Goals (current goals can now be found in the care plan section) Acute Rehab PT Goals Patient Stated Goal: go home PT Goal Formulation: With patient Time For Goal Achievement: 10/24/20 Potential to Achieve Goals: Good Progress towards PT goals: Progressing toward goals    Frequency    Min 6X/week      PT Plan Current plan remains appropriate    Co-evaluation              AM-PAC PT "6 Clicks" Mobility   Outcome Measure  Help needed turning from your back to your side while in a flat  bed without using bedrails?: A Little Help needed moving from lying on your back to sitting on the side of a flat bed without using bedrails?: A Little Help needed moving to and from a bed to a chair (including a wheelchair)?: A Little Help needed standing up from a chair using your arms (e.g., wheelchair or bedside chair)?: A Little Help needed to walk in hospital room?: A Little Help needed climbing 3-5 steps with a railing? : A Lot 6 Click Score: 17    End of Session Equipment Utilized During Treatment: Gait belt Activity Tolerance: Patient limited by pain Patient left: with call bell/phone within reach;in chair;with chair alarm set Nurse Communication: Mobility status PT Visit Diagnosis: Other abnormalities of gait and mobility (R26.89);Difficulty in walking, not elsewhere classified (R26.2)     Time: 2035-5974 PT Time Calculation (min) (ACUTE ONLY): 43 min  Charges:  $Gait Training: 23-37 mins $Therapeutic Activity: 8-22 mins                     Alma Friendly, PTA Student  Acute Rehabilitation Services Pager : [phone] Office : [phone]

## 2020-10-14 NOTE — Discharge Summary (Signed)
Physician Discharge Summary  Kyle Pitts XBM:[id number] DOB: 23-Jan-1961 DOA: 10/07/2020  PCP: Ricard Dillon, MD  Admit date: 10/07/2020 Discharge date: 10/14/2020 30 Day Unplanned Readmission Risk Score    Flowsheet Row ED to Hosp-Admission (Current) from 10/07/2020 in [facility]  30 Day Unplanned Readmission Risk Score (%) 12.86 Filed at 10/14/2020 0801       This score is the patient's risk of an unplanned readmission within 30 days of being discharged (0 -100%). The score is based on dignosis, age, lab data, medications, orders, and past utilization.   Low:  0-14.9   Medium: 15-21.9   High: 22-29.9   Extreme: 30 and above           Admitted From: Home Disposition: Home  Recommendations for Outpatient Follow-up:  Follow up with PCP in 1-2 weeks Follow-up with orthopedics in 1 week Please obtain BMP/CBC in one week Please follow up with your PCP on the following pending results: Unresulted Labs (From admission, onward)    None         Home Health: Yes Equipment/Devices: None  Discharge Condition: Stable CODE STATUS: Full code Diet recommendation: Cardiac  Subjective: Seen and examined.  Tells me that his pain is very much controlled and he has no other complaint and he is very eager to go home.  He understands that it ended up being the right decision to keep him in the hospital yesterday as his hemoglobin dropped significantly and required 2 units of transfusion.  However, he is not willing to stay another night in the hospital.  Despite of my counseling, he verbalized that he is willing to take the risk of going home and having low hemoglobin again.  After a lot of counseling and discussion, he was finally agreeable to stay until later today so we can check his hemoglobin by 3 PM and if that is a stable, he will be discharged.  His repeat CBC later in afternoon today shows hemoglobin of 8.6, down from 8.9 this morning.  Discussed with patient again,  once again I reiterated that this small drop of hemoglobin is not much worrisome however the duration between morning and evening is not enough to assess properly and I still recommend staying overnight so we can repeat CBC in the morning however patient very adamant on going home and requesting discharge so he is being discharged home today.  I encouraged him to see his PCP as soon as possible this week so CBC can be repeated.  He was also made aware that he needs to seek medical attention if he were to have symptoms such as dizziness, chest pain, headache, shortness of breath or weakness.  He verbalized understanding.  Brief/Interim Summary: Kyle Pitts is a 60 y.o. 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  Patient presented to the ER with worsening swelling of the right lower extremity from the thigh all the way down.  Associated with pain redness and some subjective fever.  On arrival in the ER he appeared to have significant cellulitis involving most of the right lower extremity.  He was hemodynamically stable. White count 14.0, Sodium 123 potassium.  Patient was admitted to hospitalist rest, orthopedics consulted. They obtained MRI right lower extremity which  shows extensive severe cellulitis and mild fasciitis involving anterior compartment and posterior compartment as well as lower leg with possible intramuscular abscess in anterior compartment in the lower leg.  Moderate to large sized joint effusion on the right knee with possible septic arthritis and osteomyelitis.  Patient's erythema/cellulitis had improved somewhat.  He underwent following procedures by Ortho on 10/09/2020. 1.  Right knee arthroscopic I&D for septic arthritis 2.  Right knee  arthroscopic major synovectomy 3.  Right leg anterior and lateral compartment fasciotomy 4.  Right lower leg irrigation debridement of skin, subcutaneous tissue, muscle, and fascia for infection. Ortho consulted ID and they recommended IV Rocephin for total of 6 weeks consistently since OR visit.  Patient's pain, edema and erythema has improved he was seen by orthopedics once again today and they have cleared him for discharge.  They have prescribed him pain medications and have recommended aspirin 81 mg twice daily for DVT prophylaxis.  They will follow-up with him as outpatient in 1 to 2 weeks.  Patient will continue IV antibiotics Rocephin 2 g daily at home per ID recommendations.  Acute blood loss anemia: Hemoglobin dropped to 6.5 on 10/12/2020 for which he received blood transfusion, hemoglobin improved to 7.4 but dropped to 5.8 again in the evening of 10/13/2020 for which she received 2 units of PRBC transfusion.  Discharge Diagnoses:  Principal Problem:   Cellulitis and abscess of right leg Active Problems:   Septic arthritis of knee ([place])   Hyponatremia   Leucocytosis    Discharge Instructions  Discharge Instructions     [facility] pharmacist to adjust dose for Vancomycin, Aminoglycosides and other anti-infective therapies as requested by physician.   Complete by: As directed    [facility] to provide Cath Flo 25m   Complete by: As directed    Administer for PICC line occlusion and as ordered by physician for other access device issues.   Anaphylaxis Kit: Provided to treat any anaphylactic reaction to the medication being provided to the patient if First Dose or when requested by physician   Complete by: As directed    Epinephrine 161mml vial / amp: Administer 0.27m59m0.27ml44mubcutaneously once for moderate to severe anaphylaxis, nurse to call physician and pharmacy when reaction occurs and call 911 if needed for immediate care   Diphenhydramine 50mg67mIV  vial: Administer 25-50mg 67mM PRN for first dose reaction, rash, itching, mild reaction, nurse to call physician and pharmacy when reaction occurs   Sodium Chloride 0.9% NS 500ml I84mdminister if needed for hypovolemic blood pressure drop or as ordered by physician after call to physician with anaphylactic reaction   Change dressing on IV access line weekly and PRN   Complete by: As directed    Flush IV access with Sodium Chloride 0.9% and Heparin 10 units/ml or 100 units/ml   Complete by: As directed    Home infusion instructions - [facility]   Complete by: As directed    Instructions: Flush IV access with Sodium Chloride 0.9% and Heparin 10units/ml or 100units/ml   Change dressing on IV access line: Weekly and PRN   Instructions Cath Flo 2mg: Ad74mister for PICC Line occlusion and as ordered by physician for other access device   [facility] pharmacist to adjust dose for: Vancomycin, Aminoglycosides and other anti-infective therapies as requested by physician   Method of administration may be changed at the discretion of home infusion pharmacist based upon assessment of the patient and/or caregiver's ability to  self-administer the medication ordered   Complete by: As directed       Allergies as of 10/14/2020       Reactions   Cephalexin Nausea Only   Upset stomach        Medication List     STOP taking these medications    docusate sodium 100 MG capsule Commonly known as: COLACE   ibuprofen 200 MG tablet Commonly known as: ADVIL   ondansetron 4 MG tablet Commonly known as: ZOFRAN   polyethylene glycol 17 g packet Commonly known as: MIRALAX / GLYCOLAX       TAKE these medications    acetaminophen 325 MG tablet Commonly known as: TYLENOL Take 1-2 tablets (325-650 mg total) by mouth every 6 (six) hours as needed for mild pain (pain score 1-3 or temp > 100.5).   albuterol 108 (90 Base) MCG/ACT inhaler Commonly known as: VENTOLIN HFA Inhale 2  puffs into the lungs every 6 (six) hours as needed for wheezing or shortness of breath.   aspirin EC 81 MG tablet Take 1 tablet (81 mg total) by mouth 2 (two) times daily. Swallow whole. What changed: when to take this   cefTRIAXone  IVPB Commonly known as: ROCEPHIN Inject 2 g into the vein daily. Indication:  Septic arthritis  First Dose: No Last Day of Therapy:  11/20/2020 Labs - Once weekly:  CBC/D and BMP, Labs - Every other week:  ESR and CRP Method of administration: IV Push Method of administration may be changed at the discretion of home infusion pharmacist based upon assessment of the patient and/or caregiver's ability to self-administer the medication ordered. What changed: additional instructions   celecoxib 100 MG capsule Commonly known as: CELEBREX Take 1 capsule (100 mg total) by mouth 2 (two) times daily.   HYDROcodone-acetaminophen 5-325 MG tablet Commonly known as: NORCO/VICODIN Take 1 tablet by mouth every 6 (six) hours as needed for up to 20 doses for moderate pain. What changed:  how much to take reasons to take this   methocarbamol 500 MG tablet Commonly known as: ROBAXIN Take 1 tablet (500 mg total) by mouth every 8 (eight) hours as needed for up to 5 days for muscle spasms.               Durable Medical Equipment  (From admission, onward)           Start     Ordered   10/14/20 1335  For home use only DME Walker rolling  Once       Question Answer Comment  Walker: With [place] Wheels   Patient needs a walker to treat with the following condition Pain and swelling of right lower leg      10/14/20 1335   10/14/20 1053  For home use only DME Crutches  Once        10/14/20 1052              Discharge Care Instructions  (From admission, onward)           Start     Ordered   10/11/20 0000  Change dressing on IV access line weekly and PRN  (Home infusion instructions - [facility] )        10/11/20 1851             Follow-up Information     Nicholes Stairs, MD Follow up in 2 week(s).   Specialty: Orthopedic Surgery Why: For suture removal Contact information: [address]  [place] [address] [phone]         [facility] Follow up.   Why: IV antibiotics. [facility] to assist with RN.        Ricard Dillon, MD Follow up in 1 week(s).   Specialty: Internal Medicine Contact information: [place] [address] [phone]                Allergies  Allergen Reactions   Cephalexin Nausea Only    Upset stomach    Consultations: Orthopedics and ID   Procedures/Studies: DG Chest 2 View  Result Date: 10/07/2020 CLINICAL DATA:  Possible sepsis EXAM: CHEST - 2 VIEW COMPARISON:  09/27/2020 FINDINGS: Cardiac shadow is within normal limits. Right PICC line is seen with the tip in the proximal SVC. Lungs are clear. No bony abnormality is noted. IMPRESSION: No acute abnormality noted. Electronically Signed   By: Inez Catalina M.D.   On: 10/07/2020 19:45   MR TIBIA FIBULA RIGHT WO CONTRAST  Result Date: 10/08/2020 CLINICAL DATA:  Soft tissue infection suspected, thigh, xray done. Recent admission for septic arthritis of the right knee, went to OR for washout on June 10th. EXAM: MRI OF THE RIGHT FEMUR WITHOUT CONTRAST MRI OF THE RIGHT LOWER EXTREMITY (TIBIA AND FIBULA) WITHOUT CONTRAST TECHNIQUE: Multiplanar, multisequence MR imaging of the right femur was performed. No intravenous contrast was administered. Multiplanar, multisequence MR imaging of the right lower extremity (tibia and fibula) was performed. No intravenous contrast was administered. COMPARISON:  Radiograph 09/27/2020 FINDINGS: Image degradation due to motion, particularly at the knee. Bones/Joint/Cartilage The cortex is intact. There is increased periarticular STIR signal in the knee, predominantly in the medial femoral condyle and medial tibial plateau. There is medial predominant  arthritis and likely degenerative medial meniscal tearing. There is a partially visualized joint effusion of the left knee, likely moderate to large size (coronal stir image 34). There is fluid collecting posterior medially at the knee, likely within a large baker cyst (axial T2 image 8) and along the popliteus (axial T2 image 12). Ligaments Grossly intact. Muscles and Tendons Upper leg/thigh: There is extensive antrum muscular edema throughout the anterior compartment of the thigh, with some inter fascial edema. There is also inter fascial edema and thickening posteriorly. There is no focal fluid collection. Lower leg: There is intramuscular edema within the anterior compartment of the lower leg, with central 1.5 x 0.6 cm collection (axial T2 fat-sat image 18). There is inter fascial edema and thickening (axial T2 fat-sat image 29). There is fluid collecting along the anterior aponeurosis of the medial head gastrocnemius muscle in the lower leg (axial T2 fat-sat image 27). Soft tissues There is extensive subcutaneous soft tissue swelling of the thigh and lower leg. IMPRESSION: Extensive soft tissue swelling of the thigh and lower leg as can be seen with severe cellulitis. Myofasciitis in the thigh involving the anterior compartment and to a lesser degree the posterior compartment. Myofasciitis in the lower leg involving the anterior and posterior compartments as described above, with possible 1.5 x 0.6 cm intramuscular collection/developing abscess in the anterior compartment. Partially visualized, moderate to large sized joint effusion of the right knee, with fluid collecting posteriorly, likely within a large Baker cyst and along the popliteus. Periarticular marrow signal changes predominantly in the medial femoral condyle and tibial plateau concerning for septic arthritis with osteomyelitis or severe degenerative arthritis. Note there is marked image degradation at the level of the knee. Electronically Signed  By: Maurine Simmering   On: 10/08/2020 15:16   MR FRMUR RIGHT WO CONTRAST  Result Date: 10/08/2020 CLINICAL DATA:  Soft tissue infection suspected, thigh, xray done. Recent admission for septic arthritis of the right knee, went to OR for washout on June 10th. EXAM: MRI OF THE RIGHT FEMUR WITHOUT CONTRAST MRI OF THE RIGHT LOWER EXTREMITY (TIBIA AND FIBULA) WITHOUT CONTRAST TECHNIQUE: Multiplanar, multisequence MR imaging of the right femur was performed. No intravenous contrast was administered. Multiplanar, multisequence MR imaging of the right lower extremity (tibia and fibula) was performed. No intravenous contrast was administered. COMPARISON:  Radiograph 09/27/2020 FINDINGS: Image degradation due to motion, particularly at the knee. Bones/Joint/Cartilage The cortex is intact. There is increased periarticular STIR signal in the knee, predominantly in the medial femoral condyle and medial tibial plateau. There is medial predominant arthritis and likely degenerative medial meniscal tearing. There is a partially visualized joint effusion of the left knee, likely moderate to large size (coronal stir image 34). There is fluid collecting posterior medially at the knee, likely within a large baker cyst (axial T2 image 8) and along the popliteus (axial T2 image 12). Ligaments Grossly intact. Muscles and Tendons Upper leg/thigh: There is extensive antrum muscular edema throughout the anterior compartment of the thigh, with some inter fascial edema. There is also inter fascial edema and thickening posteriorly. There is no focal fluid collection. Lower leg: There is intramuscular edema within the anterior compartment of the lower leg, with central 1.5 x 0.6 cm collection (axial T2 fat-sat image 18). There is inter fascial edema and thickening (axial T2 fat-sat image 29). There is fluid collecting along the anterior aponeurosis of the medial head gastrocnemius muscle in the lower leg (axial T2 fat-sat image 27). Soft tissues  There is extensive subcutaneous soft tissue swelling of the thigh and lower leg. IMPRESSION: Extensive soft tissue swelling of the thigh and lower leg as can be seen with severe cellulitis. Myofasciitis in the thigh involving the anterior compartment and to a lesser degree the posterior compartment. Myofasciitis in the lower leg involving the anterior and posterior compartments as described above, with possible 1.5 x 0.6 cm intramuscular collection/developing abscess in the anterior compartment. Partially visualized, moderate to large sized joint effusion of the right knee, with fluid collecting posteriorly, likely within a large Baker cyst and along the popliteus. Periarticular marrow signal changes predominantly in the medial femoral condyle and tibial plateau concerning for septic arthritis with osteomyelitis or severe degenerative arthritis. Note there is marked image degradation at the level of the knee. Electronically Signed   By: Maurine Simmering   On: 10/08/2020 15:16   DG CHEST PORT 1 VIEW  Result Date: 09/27/2020 CLINICAL DATA:  Preoperative chest x-ray EXAM: PORTABLE CHEST 1 VIEW COMPARISON:  March 14, 2008 FINDINGS: The heart size and mediastinal contours are within normal limits. Both lungs are clear. The visualized skeletal structures are unremarkable. IMPRESSION: No active disease. Electronically Signed   By: Dorise Bullion III M.D   On: 09/27/2020 21:59   DG Knee Complete 4 Views Right  Result Date: 09/27/2020 CLINICAL DATA:  Right knee pain and swelling without recent injury. EXAM: RIGHT KNEE - COMPLETE 4+ VIEW COMPARISON:  None. FINDINGS: No fracture or dislocation is noted. Moderate suprapatellar joint effusion is noted. Moderate narrowing of medial joint space is noted. 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 Moderate suprapatellar joint effusion. No fracture or dislocation is noted. Electronically Signed   By: Bobbe Medico.D.  On: 09/27/2020 13:02   VAS  [place] LOWER EXTREMITY VENOUS (DVT)  Result Date: 10/08/2020 --  Lower Venous DVT Study Patient Name:  EDGEL DEGNAN  Date of Exam:   10/07/2020 Medical Rec #: [id number]        Accession #:    [id number] Date of Birth: 01/25/1961        Patient Gender: M Patient Age:   81Y Exam Location:  [facility] Procedure:      VAS [place] LOWER EXTREMITY VENOUS (DVT) Referring Phys: [id number] Faythe Casa --------------------------------------------------------------------------------  Indications: Patient had right knee drainage on 09/27/2020. He has noticed increased swelling, pain and erythema since. He is taking antibotics and denies fever, chest pain and SOB.  Comparison Study: None Performing Technologist: Alecia Mackin RVT, RDCS (AE), RDMS  Examination Guidelines: A complete evaluation includes B-mode imaging, spectral Doppler, color Doppler, and power Doppler as needed of all accessible portions of each vessel. Bilateral testing is considered an integral part of a complete examination. Limited examinations for reoccurring indications may be performed as noted. The reflux portion of the exam is performed with the patient in reverse Trendelenburg.  +---------+---------------+---------+-----------+----------+--------------+ RIGHT    CompressibilityPhasicitySpontaneityPropertiesThrombus Aging +---------+---------------+---------+-----------+----------+--------------+ CFV      Full           Yes      Yes                                 +---------+---------------+---------+-----------+----------+--------------+ SFJ      Full           Yes      Yes                                 +---------+---------------+---------+-----------+----------+--------------+ FV Prox  Full           Yes      Yes                                 +---------+---------------+---------+-----------+----------+--------------+ FV Mid   Full           Yes      Yes                                  +---------+---------------+---------+-----------+----------+--------------+ FV DistalFull           Yes      Yes                                 +---------+---------------+---------+-----------+----------+--------------+ PFV      Full                                                        +---------+---------------+---------+-----------+----------+--------------+ POP      Full           Yes      Yes                                 +---------+---------------+---------+-----------+----------+--------------+  PTV      Full           Yes      Yes                                 +---------+---------------+---------+-----------+----------+--------------+ PERO     Full           Yes      Yes                                 +---------+---------------+---------+-----------+----------+--------------+ Gastroc  Full                                                        +---------+---------------+---------+-----------+----------+--------------+ GSV      Full           Yes      Yes                                 +---------+---------------+---------+-----------+----------+--------------+   +----+---------------+---------+-----------+----------+--------------+ LEFTCompressibilityPhasicitySpontaneityPropertiesThrombus Aging +----+---------------+---------+-----------+----------+--------------+ CFV Full           Yes      Yes                                 +----+---------------+---------+-----------+----------+--------------+    Findings reported to [employer] PA at 4:30.  Summary: RIGHT: - No evidence of deep vein thrombosis in the lower extremity. No indirect evidence of obstruction proximal to the inguinal ligament. - Multiple groin lymph nodes noted in groin witht the largest lymph node measuring 2.9 x .7 x .8 cm. Complex Baker's cyst measures 7.5 x 3.4 x 3.9 cm. Moderate amount of complex suprapatellar fluid. Moderate superficial edema noted in anterior thigh  and calf.  LEFT: - No evidence of common femoral vein obstruction.  *See table(s) above for measurements and observations. Electronically signed by Jenkins Rouge MD on 10/08/2020 at 9:39:55 AM.    Final    [place] EKG SITE RITE  Result Date: 09/28/2020 If Site Rite image not attached, placement could not be confirmed due to current cardiac rhythm.    Discharge Exam: Vitals:   10/14/20 0310 10/14/20 0535  BP: (!) 145/82 (!) 150/87  Pulse: 79 81  Resp: 14 16  Temp: 98.7 F (37.1 C) 98.6 F (37 C)  SpO2:  97%   Vitals:   10/14/20 0009 10/14/20 0236 10/14/20 0310 10/14/20 0535  BP: (!) 152/83 (!) 145/94 (!) 145/82 (!) 150/87  Pulse: 84 82 79 81  Resp: 15 16 14 16   Temp: 99.1 F (37.3 C) 98.9 F (37.2 C) 98.7 F (37.1 C) 98.6 F (37 C)  TempSrc: Oral Oral Oral Oral  SpO2: 99% 100%  97%  Weight:      Height:        General: Pt is alert, awake, not in acute distress Cardiovascular: RRR, S1/S2 +, no rubs, no gallops Respiratory: CTA bilaterally, no wheezing, no rhonchi Abdominal: Soft, NT, ND, bowel sounds + Extremities: Dressing in the right lower extremity, +2 pitting edema right lower extremity.    The results of significant diagnostics  from this hospitalization (including imaging, microbiology, ancillary and laboratory) are listed below for reference.     Microbiology: Recent Results (from the past 240 hour(s))  Culture, blood (routine x 2)     Status: None   Collection Time: 10/07/20  6:25 PM   Specimen: BLOOD LEFT FOREARM  Result Value Ref Range Status   Specimen Description   Final    BLOOD LEFT FOREARM Performed at [facility] [address], [place], [place] [address]    Special Requests   Final    BOTTLES DRAWN AEROBIC ONLY Blood Culture results may not be optimal due to an inadequate volume of blood received in culture bottles Performed at [facility] [address], [place], [place] [address]    Culture   Final    NO  GROWTH 5 DAYS Performed at [facility], [place] [address] [place], [place] [address]    Report Status 10/12/2020 FINAL  Final  Culture, blood (routine x 2)     Status: None   Collection Time: 10/07/20  6:35 PM   Specimen: Site Not Specified; Blood  Result Value Ref Range Status   Specimen Description   Final    SITE NOT SPECIFIED Performed at [facility] [address], [place], [place] [address]    Special Requests   Final    BOTTLES DRAWN AEROBIC AND ANAEROBIC Blood Culture adequate volume Performed at [facility] [address] [place], [place] [address]    Culture   Final    NO GROWTH 5 DAYS Performed at [facility], [place] [address] [place], [place] [address]    Report Status 10/12/2020 FINAL  Final  Resp Panel by RT-PCR (Flu A&B, Covid) Nasopharyngeal Swab     Status: None   Collection Time: 10/07/20  6:38 PM   Specimen: Nasopharyngeal Swab; Nasopharyngeal(NP) swabs in vial transport medium  Result Value Ref Range Status   SARS Coronavirus 2 by RT PCR NEGATIVE NEGATIVE Final    Comment: (NOTE) SARS-CoV-2 target nucleic acids are NOT DETECTED.  The SARS-CoV-2 RNA is generally detectable in upper respiratory specimens during the acute phase of infection. The lowest concentration of SARS-CoV-2 viral copies this assay can detect is 138 copies/mL. A negative result does not preclude SARS-Cov-2 infection and should not be used as the sole basis for treatment or other patient management decisions. A negative result may occur with  improper specimen collection/handling, submission of specimen other than nasopharyngeal swab, presence of viral mutation(s) within the areas targeted by this assay, and inadequate number of viral copies(<138 copies/mL). A negative result must be combined with clinical observations, patient history, and epidemiological information. The expected result is Negative.  Fact Sheet for Patients:   EntrepreneurPulse.com.au  Fact Sheet for Healthcare Providers:  IncredibleEmployment.be  This test is no t yet approved or cleared by the [place] FDA and  has been authorized for detection and/or diagnosis of SARS-CoV-2 by FDA under an Emergency Use Authorization (EUA). This EUA will remain  in effect (meaning this test can be used) for the duration of the COVID-19 declaration under Section 564(b)(1) of the Act, 21 U.S.C.section 360bbb-3(b)(1), unless the authorization is terminated  or revoked sooner.       Influenza A by PCR NEGATIVE NEGATIVE Final   Influenza B by PCR NEGATIVE NEGATIVE Final    Comment: (NOTE) The Xpert Xpress SARS-CoV-2/FLU/RSV plus assay is intended as an aid in the diagnosis of influenza from  Nasopharyngeal swab specimens and should not be used as a sole basis for treatment. Nasal washings and aspirates are unacceptable for Xpert Xpress SARS-CoV-2/FLU/RSV testing.  Fact Sheet for Patients: EntrepreneurPulse.com.au  Fact Sheet for Healthcare Providers: IncredibleEmployment.be  This test is not yet approved or cleared by the [place] FDA and has been authorized for detection and/or diagnosis of SARS-CoV-2 by FDA under an Emergency Use Authorization (EUA). This EUA will remain in effect (meaning this test can be used) for the duration of the COVID-19 declaration under Section 564(b)(1) of the Act, 21 U.S.C. section 360bbb-3(b)(1), unless the authorization is terminated or revoked.  Performed at [facility], [place] [address], [place], [place] [address]   Surgical pcr screen     Status: None   Collection Time: 10/09/20  5:39 AM   Specimen: Nasal Mucosa; Nasal Swab  Result Value Ref Range Status   MRSA, PCR NEGATIVE NEGATIVE Final   Staphylococcus aureus NEGATIVE NEGATIVE Final    Comment: (NOTE) The Xpert SA Assay (FDA approved for NASAL specimens in  patients 39 years of age and older), is one component of a comprehensive surveillance program. It is not intended to diagnose infection nor to guide or monitor treatment. Performed at [facility], [place] [address] [place], [place] [address]      Labs: BNP (last 3 results) No results for input(s): BNP in the last 8760 hours. Basic Metabolic Panel: Recent Labs  Lab 10/08/20 0539 10/09/20 0336 10/10/20 0315 10/11/20 0459 10/13/20 0500  NA 128* 129* 128* 130* 129*  K 4.3 4.0 4.1 4.0 3.5  CL 95* 97* 100 99 99  CO2 28 27 26 27 26   GLUCOSE 86 88 123* 85 78  BUN 16 15 17 11 9   CREATININE 0.76 0.58* 0.65 0.57* 0.58*  CALCIUM 8.8* 8.7* 8.4* 8.5* 8.3*   Liver Function Tests: Recent Labs  Lab 10/08/20 0539 10/10/20 0315 10/11/20 0459  AST 59* 48* 63*  ALT 43 32 37  ALKPHOS 341* 253* 275*  BILITOT 4.4* 3.1* 2.4*  PROT 7.1 6.4* 6.6  ALBUMIN 2.2* 1.7* 1.7*   No results for input(s): LIPASE, AMYLASE in the last 168 hours. No results for input(s): AMMONIA in the last 168 hours. CBC: Recent Labs  Lab 10/10/20 0315 10/11/20 0459 10/12/20 0323 10/12/20 2116 10/13/20 0500 10/13/20 1800 10/14/20 0736 10/14/20 1558  WBC 11.5* 8.9 10.2  --  9.9 7.9 9.8 9.6  NEUTROABS 7.7 7.1 6.2  --  5.9  --  5.9  --   HGB 7.7* 7.1* 6.5* 7.4* 7.1* 5.8* 8.9* 8.6*  HCT 23.7* 21.3* 19.2* 21.9* 21.9* 17.6* 26.9* 25.7*  MCV 106.3* 106.0* 106.1*  --  103.3* 102.9* 98.2 97.7  PLT 212 218 240  --  259 194 270 261   Cardiac Enzymes: No results for input(s): CKTOTAL, CKMB, CKMBINDEX, TROPONINI in the last 168 hours. BNP: Invalid input(s): POCBNP CBG: No results for input(s): GLUCAP in the last 168 hours. D-Dimer No results for input(s): DDIMER in the last 72 hours. Hgb A1c No results for input(s): HGBA1C in the last 72 hours. Lipid Profile No results for input(s): CHOL, HDL, LDLCALC, TRIG, CHOLHDL, LDLDIRECT in the last 72 hours. Thyroid function studies No results for  input(s): TSH, T4TOTAL, T3FREE, THYROIDAB in the last 72 hours.  Invalid input(s): FREET3 Anemia work up No results for input(s): VITAMINB12, FOLATE, FERRITIN, TIBC, IRON, RETICCTPCT in the last 72 hours. Urinalysis    Component Value Date/Time   COLORURINE AMBER (A)  10/07/2020 2022   APPEARANCEUR HAZY (A) 10/07/2020 2022   LABSPEC 1.016 10/07/2020 2022   PHURINE 5.0 10/07/2020 2022   GLUCOSEU NEGATIVE 10/07/2020 2022   HGBUR NEGATIVE 10/07/2020 2022   BILIRUBINUR NEGATIVE 10/07/2020 2022   KETONESUR NEGATIVE 10/07/2020 2022   PROTEINUR NEGATIVE 10/07/2020 2022   NITRITE NEGATIVE 10/07/2020 2022   LEUKOCYTESUR NEGATIVE 10/07/2020 2022   Sepsis Labs Invalid input(s): PROCALCITONIN,  WBC,  LACTICIDVEN Microbiology Recent Results (from the past 240 hour(s))  Culture, blood (routine x 2)     Status: None   Collection Time: 10/07/20  6:25 PM   Specimen: BLOOD LEFT FOREARM  Result Value Ref Range Status   Specimen Description   Final    BLOOD LEFT FOREARM Performed at [facility], [place] [address] [place], [place] [address]    Special Requests   Final    BOTTLES DRAWN AEROBIC ONLY Blood Culture results may not be optimal due to an inadequate volume of blood received in culture bottles Performed at [place] [address] [place], [place] [address]    Culture   Final    NO GROWTH 5 DAYS Performed at [facility], [place] [address], [place], [place] [address]    Report Status 10/12/2020 FINAL  Final  Culture, blood (routine x 2)     Status: None   Collection Time: 10/07/20  6:35 PM   Specimen: Site Not Specified; Blood  Result Value Ref Range Status   Specimen Description   Final    SITE NOT SPECIFIED Performed at [place] [address], [place], [place] [address]    Special Requests   Final    BOTTLES DRAWN AEROBIC AND ANAEROBIC Blood Culture adequate volume Performed at [place] [address] [place], [place] [address]    Culture   Final    NO GROWTH 5 DAYS Performed at [facility], [place] [address] [place], [place] [address]    Report Status 10/12/2020 FINAL  Final  Resp Panel by RT-PCR (Flu A&B, Covid) Nasopharyngeal Swab     Status: None   Collection Time: 10/07/20  6:38 PM   Specimen: Nasopharyngeal Swab; Nasopharyngeal(NP) swabs in vial transport medium  Result Value Ref Range Status   SARS Coronavirus 2 by RT PCR NEGATIVE NEGATIVE Final    Comment: (NOTE) SARS-CoV-2 target nucleic acids are NOT DETECTED.  The SARS-CoV-2 RNA is generally detectable in upper respiratory specimens during the acute phase of infection. The lowest concentration of SARS-CoV-2 viral copies this assay can detect is 138 copies/mL. A negative result does not preclude SARS-Cov-2 infection and should not be used as the sole basis for treatment or other patient management decisions. A negative result may occur with  improper specimen collection/handling, submission of specimen other than nasopharyngeal swab, presence of viral mutation(s) within the areas targeted by this assay, and inadequate number of viral copies(<138 copies/mL). A negative result must be combined with clinical observations, patient history, and epidemiological information. The expected result is Negative.  Fact Sheet for Patients:  EntrepreneurPulse.com.au  Fact Sheet for Healthcare Providers:  IncredibleEmployment.be  This test is no t yet approved or cleared by the [place] FDA and  has been authorized for detection and/or diagnosis of SARS-CoV-2 by FDA under an Emergency Use Authorization (EUA). This EUA will remain  in effect (meaning this test can be used) for the duration of the COVID-19 declaration under Section 564(b)(1) of the Act, 21  U.S.C.section 360bbb-3(b)(1), unless the authorization is terminated  or revoked sooner.       Influenza A by  PCR NEGATIVE NEGATIVE Final   Influenza B by PCR NEGATIVE NEGATIVE Final    Comment: (NOTE) The Xpert Xpress SARS-CoV-2/FLU/RSV plus assay is intended as an aid in the diagnosis of influenza from Nasopharyngeal swab specimens and should not be used as a sole basis for treatment. Nasal washings and aspirates are unacceptable for Xpert Xpress SARS-CoV-2/FLU/RSV testing.  Fact Sheet for Patients: EntrepreneurPulse.com.au  Fact Sheet for Healthcare Providers: IncredibleEmployment.be  This test is not yet approved or cleared by the [place] FDA and has been authorized for detection and/or diagnosis of SARS-CoV-2 by FDA under an Emergency Use Authorization (EUA). This EUA will remain in effect (meaning this test can be used) for the duration of the COVID-19 declaration under Section 564(b)(1) of the Act, 21 U.S.C. section 360bbb-3(b)(1), unless the authorization is terminated or revoked.  Performed at [facility], [place] [address], [place] [employer] [place], [place] [address]   Surgical pcr screen     Status: None   Collection Time: 10/09/20  5:39 AM   Specimen: Nasal Mucosa; Nasal Swab  Result Value Ref Range Status   MRSA, PCR NEGATIVE NEGATIVE Final   Staphylococcus aureus NEGATIVE NEGATIVE Final    Comment: (NOTE) The Xpert SA Assay (FDA approved for NASAL specimens in patients 62 years of age and older), is one component of a comprehensive surveillance program. It is not intended to diagnose infection nor to guide or monitor treatment. Performed at [facility], [place] [address] [place],  [address]      Time coordinating discharge: Over 30 minutes  SIGNED:   Darliss Cheney, MD  [facility] 10/14/2020, 4:58 PM  If 7PM-7AM, please contact night-coverage [email or web address]

## 2020-10-14 NOTE — Progress Notes (Signed)
Subjective:  Kyle Pitts is a 60 y.o. male, 5 Days Post-Op    s/p Procedure(s): ARTHROSCOPY KNEE,ARTHROSCOPIC I & D, CALF ABSCESS IRRIGATION AND DEBRIDEMENT EXTREMITY   Patient reports pain as mild to moderate.  Has worked with therapy, feels like his is improving. He feels better after receiving blood.  Objective:   VITALS:   Vitals:   10/14/20 0009 10/14/20 0236 10/14/20 0310 10/14/20 0535  BP: (!) 152/83 (!) 145/94 (!) 145/82 (!) 150/87  Pulse: 84 82 79 81  Resp: 15 16 14 16   Temp: 99.1 F (37.3 C) 98.9 F (37.2 C) 98.7 F (37.1 C) 98.6 F (37 C)  TempSrc: Oral Oral Oral Oral  SpO2: 99% 100%  97%  Weight:      Height:       Constitutional: General Appearance: healthy-appearing, well-nourished, and well-developed.  In hospital chair Level of Distress: no acute distress. Eyes: Lens (normal) clear: both eyes. Head: Head: normocephalic and atraumatic. Lungs: Respiratory effort: no dyspnea. Skin: Inspection and palpation: no rash, lesions Neurologic: Cranial Nerves: grossly intact. Sensation: grossly intact. Psychiatric: Insight: good judgement and insight. Mental Status: normal mood and affect and active and alert. Orientation: to time, place, and person.  Right Lower Extremity:    Inspection:Significant 2-3+ edema from the lateral mid thigh , lateral ankle, distal leg .  dressing removed showing wound without signs of acute surgical infection with 3 arthroscopic portals at the knee and longitudinal incision on the lateral lower leg, sutures intact.  Similar appearance of erythema. Slightly improved edema.  Knee range of motion 0-65.  Erythema noted on the lateral thigh, medial and lateral lower leg. Slight darkening at the mid aspect of the incision.    intact extensor mech No posterior calf tenderness Distal ankle and foot strength normal Sensation is intact to light touch Extremity is warm and well perfuse,cap refill less than 2 seconds.                  Lab Results  Component Value Date   WBC 9.8 10/14/2020   HGB 8.9 (L) 10/14/2020   HCT 26.9 (L) 10/14/2020   MCV 98.2 10/14/2020   PLT 270 10/14/2020   BMET    Component Value Date/Time   NA 129 (L) 10/13/2020 0500   K 3.5 10/13/2020 0500   CL 99 10/13/2020 0500   CO2 26 10/13/2020 0500   GLUCOSE 78 10/13/2020 0500   BUN 9 10/13/2020 0500   CREATININE 0.58 (L) 10/13/2020 0500   CALCIUM 8.3 (L) 10/13/2020 0500   GFRNONAA >60 10/13/2020 0500   GFRAA  05/13/2010 1718    >60        The eGFR has been calculated using the MDRD equation. This calculation has not been validated in all clinical situations. eGFR's persistently <60 mL/min signify possible Chronic Kidney Disease.     Assessment/Plan: 5 Days Post-Op   Principal Problem:   Cellulitis and abscess of right leg Active Problems:   Septic arthritis of knee (HCC)   Hyponatremia   Leucocytosis   Advance diet Up with therapy Infectious disease was consulted for appropriate antibiotics while inpatient and for discharge .IV ceftriaxone was recommended.  Dressings changed today.    PICC line still in place.  weightbearing Status: WBAT, ROM as tolerated.  DVT Prophylaxis: Lovenox while inpatient, aspirin 81 mg upon discharge for DVT prophylaxis  Ok for discharge from ortho standpoint. Will need to confirm [facility] for PICC line management is arranged as well as his  ceftriaxone. Primary team is planning to re-check CBC one more time this afternoon and if stable he will be D/c.    Upon discharge would like to follow-up in on 10/18/20 for repeat wound check - this appointment is already set.   Sutures will remain in for 2 to 3 weeks total.  Dressing will be changed as needed when saturated with nonadherent dressing, ABD pads, Kling, and Ace. Ok to shower at 7 days post op.   Faythe Casa 10/14/2020, 10:41 AM  Jonelle Sidle PA-C  Physician Assistant with Dr. Lillia Abed [facility]

## 2020-10-14 NOTE — TOC Transition Note (Addendum)
Transition of Care [facility]) - CM/SW Discharge Note  Patient Details  Name: Kyle Pitts MRN: [id number] Date of Birth: 05-Mar-1961  Transition of Care [facility]) CM/SW Contact:  Ewing Schlein, LCSW Phone Number: 10/14/2020, 11:12 AM  Clinical Narrative: Patient to discharge home with IV antibiotics. CSW confirmed with Pam that OPAT orders were signed. Per Pam, patient is receiving HHRN services through [facility] with an LOG as patient is uninsured. Patient to discharge after receiving today's IV antibiotics. TOC signing off.  Addendum: PT and OT recommended HH. [facility] accepted charity HH. Patient in need of rolling walker. Charity DME referral made to [facility] with [facility]. Patient updated.  Addendum II: CSW received update from Amy with [facility] that the agency will not accept the patient for charity HH due to ETOH abuse. [facility] has also already provided patient with a charity RW. CSW updated patient.  Final next level of care: Home w Home Health Services Barriers to Discharge: Barriers Resolved  Patient Goals and CMS Choice CMS Medicare.gov Compare Post Acute Care list provided to:: Patient Choice offered to / list presented to : Patient  Discharge Plan and Services HH Arranged: RN [facility] Agency: Other - See comment  Readmission Risk Interventions No flowsheet data found.

## 2020-10-15 ENCOUNTER — Encounter: Payer: Self-pay | Admitting: *Deleted

## 2020-10-15 LAB — BPAM RBC
Blood Product Expiration Date: 202207132359
Blood Product Expiration Date: 202207132359
Blood Product Expiration Date: 202207132359
ISSUE DATE / TIME: 202206251200
ISSUE DATE / TIME: 202206262348
ISSUE DATE / TIME: 202206270248
Unit Type and Rh: 6200
Unit Type and Rh: 6200
Unit Type and Rh: 6200

## 2020-10-15 LAB — TYPE AND SCREEN
ABO/RH(D): A POS
Antibody Screen: NEGATIVE
Unit division: 0
Unit division: 0
Unit division: 0

## 2020-10-16 ENCOUNTER — Telehealth: Payer: Self-pay

## 2020-10-16 NOTE — Telephone Encounter (Signed)
Kyle Pitts with [facility] called to report critical alert of RBC 2.58. I reported this to Dr Orvan Falconer verbally and he reports that it is fine to send message to on call Dr Ninetta Lights but since patient is admitted no other action needs to be taken by [facility] at this time.

## 2020-10-24 ENCOUNTER — Telehealth: Payer: Self-pay

## 2020-10-24 NOTE — Telephone Encounter (Signed)
Relayed verbal orders for repeat CBC with differential to Heather at [facility] per Dr. Ninetta Lights. Herbert Seta states she will ask nursing to go out as soon as possible.   Sandie Ano, RN

## 2020-11-01 ENCOUNTER — Ambulatory Visit: Payer: Self-pay | Admitting: Infectious Disease

## 2020-11-07 ENCOUNTER — Encounter (HOSPITAL_COMMUNITY): Payer: Self-pay | Admitting: *Deleted

## 2020-11-07 ENCOUNTER — Inpatient Hospital Stay (HOSPITAL_COMMUNITY)
Admission: EM | Admit: 2020-11-07 | Discharge: 2020-11-11 | DRG: 315 | Disposition: A | Discharge status: Home / Self Care | Payer: Self-pay | Attending: Internal Medicine | Admitting: Internal Medicine

## 2020-11-07 DIAGNOSIS — Z8249 Family history of ischemic heart disease and other diseases of the circulatory system: Secondary | ICD-10-CM

## 2020-11-07 DIAGNOSIS — E871 Hypo-osmolality and hyponatremia: Secondary | ICD-10-CM | POA: Diagnosis present

## 2020-11-07 DIAGNOSIS — Y848 Other medical procedures as the cause of abnormal reaction of the patient, or of later complication, without mention of misadventure at the time of the procedure: Secondary | ICD-10-CM | POA: Diagnosis present

## 2020-11-07 DIAGNOSIS — Z823 Family history of stroke: Secondary | ICD-10-CM

## 2020-11-07 DIAGNOSIS — M009 Pyogenic arthritis, unspecified: Secondary | ICD-10-CM | POA: Diagnosis present

## 2020-11-07 DIAGNOSIS — T80219A Unspecified infection due to central venous catheter, initial encounter: Principal | ICD-10-CM | POA: Diagnosis present

## 2020-11-07 DIAGNOSIS — Z20822 Contact with and (suspected) exposure to covid-19: Secondary | ICD-10-CM | POA: Diagnosis present

## 2020-11-07 DIAGNOSIS — Z7982 Long term (current) use of aspirin: Secondary | ICD-10-CM

## 2020-11-07 DIAGNOSIS — D649 Anemia, unspecified: Secondary | ICD-10-CM | POA: Diagnosis present

## 2020-11-07 DIAGNOSIS — D539 Nutritional anemia, unspecified: Secondary | ICD-10-CM | POA: Diagnosis present

## 2020-11-07 DIAGNOSIS — Z79899 Other long term (current) drug therapy: Secondary | ICD-10-CM

## 2020-11-07 DIAGNOSIS — Z888 Allergy status to other drugs, medicaments and biological substances status: Secondary | ICD-10-CM

## 2020-11-07 HISTORY — DX: Pyogenic arthritis, unspecified: M00.9

## 2020-11-07 HISTORY — DX: Hypo-osmolality and hyponatremia: E87.1

## 2020-11-07 HISTORY — DX: Unspecified cirrhosis of liver: K74.60

## 2020-11-07 LAB — CBC WITH DIFFERENTIAL/PLATELET
Abs Immature Granulocytes: 0.02 10*3/uL (ref 0.00–0.07)
Basophils Absolute: 0 10*3/uL (ref 0.0–0.1)
Basophils Relative: 1 %
Eosinophils Absolute: 0.1 10*3/uL (ref 0.0–0.5)
Eosinophils Relative: 2 %
HCT: 22.4 % — ABNORMAL LOW (ref 39.0–52.0)
Hemoglobin: 7.4 g/dL — ABNORMAL LOW (ref 13.0–17.0)
Immature Granulocytes: 0 %
Lymphocytes Relative: 29 %
Lymphs Abs: 1.7 10*3/uL (ref 0.7–4.0)
MCH: 36.6 pg — ABNORMAL HIGH (ref 26.0–34.0)
MCHC: 33 g/dL (ref 30.0–36.0)
MCV: 110.9 fL — ABNORMAL HIGH (ref 80.0–100.0)
Monocytes Absolute: 1 10*3/uL (ref 0.1–1.0)
Monocytes Relative: 16 %
Neutro Abs: 3.2 10*3/uL (ref 1.7–7.7)
Neutrophils Relative %: 52 %
Platelets: 232 10*3/uL (ref 150–400)
RBC: 2.02 MIL/uL — ABNORMAL LOW (ref 4.22–5.81)
RDW: 25.5 % — ABNORMAL HIGH (ref 11.5–15.5)
WBC: 6 10*3/uL (ref 4.0–10.5)
nRBC: 0 % (ref 0.0–0.2)

## 2020-11-07 LAB — BASIC METABOLIC PANEL
Anion gap: 5 (ref 5–15)
BUN: 6 mg/dL (ref 6–20)
CO2: 25 mmol/L (ref 22–32)
Calcium: 8.3 mg/dL — ABNORMAL LOW (ref 8.9–10.3)
Chloride: 96 mmol/L — ABNORMAL LOW (ref 98–111)
Creatinine, Ser: 0.47 mg/dL — ABNORMAL LOW (ref 0.61–1.24)
GFR, Estimated: >60 mL/min (ref >60)
Glucose, Bld: 92 mg/dL (ref 70–99)
Potassium: 3.8 mmol/L (ref 3.5–5.1)
Sodium: 126 mmol/L — ABNORMAL LOW (ref 135–145)

## 2020-11-07 LAB — LACTIC ACID, PLASMA: Lactic Acid, Venous: 1.4 mmol/L (ref 0.5–1.9)

## 2020-11-07 MED ORDER — VANCOMYCIN HCL IN DEXTROSE 1-5 GM/200ML-% IV SOLN: 1000.0000 mg | Freq: Once | INTRAVENOUS | Status: DC

## 2020-11-07 MED ORDER — SODIUM CHLORIDE 0.9 % IV SOLN
2.0000 g | Freq: Once | INTRAVENOUS | Status: AC
  Administered 2020-11-07: 2 g via INTRAVENOUS
  Filled 2020-11-07: qty 2

## 2020-11-07 MED ORDER — OXYCODONE-ACETAMINOPHEN 5-325 MG PO TABS
1.0000 | ORAL_TABLET | ORAL | Status: AC
  Administered 2020-11-07: 1 via ORAL
  Filled 2020-11-07: qty 1

## 2020-11-07 MED ORDER — VANCOMYCIN HCL 2000 MG/400ML IV SOLN
2000.0000 mg | Freq: Once | INTRAVENOUS | Status: AC
  Administered 2020-11-08: 2000 mg via INTRAVENOUS
  Filled 2020-11-07: qty 400

## 2020-11-07 NOTE — ED Provider Notes (Signed)
Emergency Medicine Provider Triage Evaluation Note  Kyle Pitts , a 60 y.o. male  was evaluated in triage.  Pt complains of infected PICC line.  Was placed for infection to right lower extremity, septic arthritis.  He had home health nurse come out 2 days ago.  She noted some surrounding redness.  She replaced a dressing.  Noted yesterday evening there was some purulent discharge surrounding the PICC line.  He denies fever, chills, nausea, vomiting, pain.   Followed by ID  Review of Systems  Positive: Infected PICC line Negative: Fever, chills, nausea, vomiting, extremity swelling  Physical Exam  There were no vitals taken for this visit. Gen:   Awake, no distress   Resp:  Normal effort  MSK:   Moves extremities without difficulty  Other:  PICC to right upper extremity.  There is some mild surrounding erythema, appears to be fluid pocket underneath bandage.  Defer removal of bandage until patient placed in back  Medical Decision Making  Medically screening exam initiated at 1:42 PM.  Appropriate orders placed.  [name] was informed that the remainder of the evaluation will be completed by another provider, this initial triage assessment does not replace that evaluation, and the importance of remaining in the ED until their evaluation is complete.  Infected IV access   Kert Shackett A, PA-C 11/07/20 1345    Wynetta Fines, MD 11/08/20 [phone]

## 2020-11-07 NOTE — ED Provider Notes (Signed)
[place] DEPT Provider Note   CSN: [id number] Arrival date & time: 11/07/20  1327     History Chief Complaint  Patient presents with   Vascular Access Problem    Kyle Pitts is a 60 y.o. male.  The history is provided by the patient.  Illness Location:  RUE picc line Quality:  Purulent drainage Severity:  Moderate Onset quality:  Gradual Duration:  2 days Timing:  Constant Progression:  Worsening Chronicity:  New Context:  IV antibiotics for septic arthritis of the R knee Relieved by:  Nothing Worsened by:  Time Ineffective treatments:  None tried Associated symptoms: no fever, no loss of consciousness, no vomiting and no wheezing   Risk factors:  Septic arthritis     History reviewed. No pertinent past medical history.  Patient Active Problem List   Diagnosis Date Noted   Hyponatremia 10/07/2020   Leucocytosis 10/07/2020   Cellulitis and abscess of right leg 10/07/2020   Septic arthritis of knee ([place]) 09/28/2020   ALLERGIC RHINITIS 01/13/2007   ASTHMA 01/13/2007    Past Surgical History:  Procedure Laterality Date   I & D EXTREMITY Right 10/09/2020   Procedure: IRRIGATION AND DEBRIDEMENT EXTREMITY;  Surgeon: Nicholes Stairs, MD;  Location: [facility];  Service: Orthopedics;  Laterality: Right;   KNEE ARTHROSCOPY Right 09/27/2020   Procedure: ARTHROSCOPY KNEE, [place];  Surgeon: Melina Schools, MD;  Location: [facility];  Service: Orthopedics;  Laterality: Right;   KNEE ARTHROSCOPY Right 10/09/2020   Procedure: ARTHROSCOPY KNEE,ARTHROSCOPIC I & D, CALF ABSCESS;  Surgeon: Nicholes Stairs, MD;  Location: [facility];  Service: Orthopedics;  Laterality: Right;       Family History  Problem Relation Age of Onset   Hepatitis Mother    Cirrhosis Mother    Stroke Father    Heart failure Father     Social History   Tobacco Use   Smoking status: Never   Smokeless tobacco: Never  Vaping Use   Vaping Use: Never used   Substance Use Topics   Alcohol use: Yes    Alcohol/week: 24.0 standard drinks    Types: 24 Cans of beer per week    Comment: 3-4 when home from work   Drug use: Not Currently    Types: Cocaine, Marijuana    Comment: last used 1 month ago    Home Medications Prior to Admission medications   Medication Sig Start Date End Date Taking? Authorizing Provider  acetaminophen (TYLENOL) 325 MG tablet Take 1-2 tablets (325-650 mg total) by mouth every 6 (six) hours as needed for mild pain (pain score 1-3 or temp > 100.5). 10/02/20   Charlyne Petrin, PA-C  albuterol (VENTOLIN HFA) 108 (90 Base) MCG/ACT inhaler Inhale 2 puffs into the lungs every 6 (six) hours as needed for wheezing or shortness of breath.    [provider]  aspirin EC 81 MG tablet Take 1 tablet (81 mg total) by mouth 2 (two) times daily. Swallow whole. 10/14/20 11/13/20  Darliss Cheney, MD  cefTRIAXone (ROCEPHIN) IVPB Inject 2 g into the vein daily. Indication:  Septic arthritis  First Dose: No Last Day of Therapy:  11/20/2020 Labs - Once weekly:  CBC/D and BMP, Labs - Every other week:  ESR and CRP Method of administration: IV Push Method of administration may be changed at the discretion of home infusion pharmacist based upon assessment of the patient and/or caregiver's ability to self-administer the medication ordered. 10/11/20 11/20/20  Darliss Cheney, MD  celecoxib (CELEBREX) 100 MG capsule Take 1 capsule (100 mg total) by mouth 2 (two) times daily. 10/02/20   Charlyne Petrin, PA-C  HYDROcodone-acetaminophen (NORCO/VICODIN) 5-325 MG tablet Take 1 tablet by mouth every 6 (six) hours as needed for up to 20 doses for moderate pain. 10/11/20   Jonelle Sidle D, PA    Allergies    Cephalexin  Review of Systems   Review of Systems  Constitutional:  Negative for fever.  HENT:  Negative for drooling.   Eyes:  Negative for redness.  Respiratory:  Negative for wheezing.   Cardiovascular:  Negative for palpitations.   Gastrointestinal:  Negative for vomiting.  Genitourinary:  Negative for difficulty urinating.  Musculoskeletal:  Negative for neck stiffness.  Neurological:  Negative for loss of consciousness and facial asymmetry.  Psychiatric/Behavioral:  Negative for agitation.   All other systems reviewed and are negative.  Physical Exam Updated Vital Signs BP (!) 172/91 (BP Location: Left Arm)   Pulse 95   Temp 98.8 F (37.1 C) (Oral)   Resp 16   SpO2 100%   Physical Exam Vitals and nursing note reviewed.  Constitutional:      Appearance: Normal appearance.  HENT:     Head: Normocephalic and atraumatic.     Nose: Nose normal.  Eyes:     Extraocular Movements: Extraocular movements intact.     Conjunctiva/sclera: Conjunctivae normal.  Cardiovascular:     Rate and Rhythm: Normal rate and regular rhythm.     Pulses: Normal pulses.     Heart sounds: Normal heart sounds.  Pulmonary:     Effort: Pulmonary effort is normal.     Breath sounds: Normal breath sounds.  Abdominal:     General: Abdomen is flat. Bowel sounds are normal.     Palpations: Abdomen is soft.     Tenderness: There is no abdominal tenderness. There is no guarding.  Musculoskeletal:        General: Normal range of motion.       Arms:     Cervical back: Normal range of motion and neck supple.  Skin:    General: Skin is warm and dry.     Capillary Refill: Capillary refill takes less than 2 seconds.  Neurological:     General: No focal deficit present.     Mental Status: He is alert.     Deep Tendon Reflexes: Reflexes normal.  Psychiatric:        Mood and Affect: Mood normal.        Behavior: Behavior normal.    ED Results / Procedures / Treatments   Labs (all labs ordered are listed, but only abnormal results are displayed) Results for orders placed or performed during the hospital encounter of 11/07/20  CBC with Differential  Result Value Ref Range   WBC 6.0 4.0 - 10.5 K/uL   RBC 2.02 (L) 4.22 - 5.81  MIL/uL   Hemoglobin 7.4 (L) 13.0 - 17.0 g/dL   HCT 22.4 (L) 39.0 - 52.0 %   MCV 110.9 (H) 80.0 - 100.0 fL   MCH 36.6 (H) 26.0 - 34.0 pg   MCHC 33.0 30.0 - 36.0 g/dL   RDW 25.5 (H) 11.5 - 15.5 %   Platelets 232 150 - 400 K/uL   nRBC 0.0 0.0 - 0.2 %   Neutrophils Relative % 52 %   Neutro Abs 3.2 1.7 - 7.7 K/uL   Lymphocytes Relative 29 %   Lymphs Abs 1.7 0.7 - 4.0 K/uL  Monocytes Relative 16 %   Monocytes Absolute 1.0 0.1 - 1.0 K/uL   Eosinophils Relative 2 %   Eosinophils Absolute 0.1 0.0 - 0.5 K/uL   Basophils Relative 1 %   Basophils Absolute 0.0 0.0 - 0.1 K/uL   Immature Granulocytes 0 %   Abs Immature Granulocytes 0.02 0.00 - 0.07 K/uL   Polychromasia PRESENT   Basic metabolic panel  Result Value Ref Range   Sodium 126 (L) 135 - 145 mmol/L   Potassium 3.8 3.5 - 5.1 mmol/L   Chloride 96 (L) 98 - 111 mmol/L   CO2 25 22 - 32 mmol/L   Glucose, Bld 92 70 - 99 mg/dL   BUN 6 6 - 20 mg/dL   Creatinine, Ser 0.47 (L) 0.61 - 1.24 mg/dL   Calcium 8.3 (L) 8.9 - 10.3 mg/dL   GFR, Estimated >60 >60 mL/min   Anion gap 5 5 - 15  Lactic acid, plasma  Result Value Ref Range   Lactic Acid, Venous 1.4 0.5 - 1.9 mmol/L   No results found.   EKG None  Radiology No results found.  Procedures Procedures   Medications Ordered in ED Medications  vancomycin (VANCOCIN) IVPB 1000 mg/200 mL premix (has no administration in time range)    ED Course  I have reviewed the triage vital signs and the nursing notes.  Pertinent labs & imaging results that were available during my care of the patient were reviewed by me and considered in my medical decision making (see chart for details).   Admit to medicine for line infection    Final Clinical Impression(s) / ED Diagnoses Final diagnoses:  PICC line infection, initial encounter  Hyponatremia  Anemia, unspecified type    Rx / DC Orders ED Discharge Orders     None        Braydn Carneiro, MD 11/07/20 2329

## 2020-11-07 NOTE — ED Triage Notes (Signed)
Pt here for infection to PICC line site. He first noticed redness around site 2 days ago, had nurse clean it and replace dressing. Noticed purulent drainage last night around PICC. He is receiving IV abx through PICC. No fevers/chills.

## 2020-11-07 NOTE — Progress Notes (Signed)
A consult was received from an ED physician for Cefepime per pharmacy dosing.  The patient's profile has been reviewed for ht/wt/allergies/indication/available labs.   A one time order has been placed for Cefepime 2gm IV.  Also increased Vancomycin to 2gm IV.   Keflex allergy noted- nausea only.  He has been on IV Rocephin as outpatient for septic arthritis of knee and is tolerating.  Presents today with concern for infected PICC line.    Further antibiotics/pharmacy consults should be ordered by admitting physician if indicated.                       Thank you, Junita Push PharmD 11/07/2020  11:40 PM

## 2020-11-08 ENCOUNTER — Encounter (HOSPITAL_COMMUNITY): Payer: Self-pay | Admitting: Internal Medicine

## 2020-11-08 DIAGNOSIS — D649 Anemia, unspecified: Secondary | ICD-10-CM

## 2020-11-08 DIAGNOSIS — T80219A Unspecified infection due to central venous catheter, initial encounter: Principal | ICD-10-CM

## 2020-11-08 LAB — IRON AND TIBC
Iron: 47 ug/dL (ref 45–182)
Saturation Ratios: 26 % (ref 17.9–39.5)
TIBC: 182 ug/dL — ABNORMAL LOW (ref 250–450)
UIBC: 135 ug/dL

## 2020-11-08 LAB — BASIC METABOLIC PANEL
Anion gap: 3 — ABNORMAL LOW (ref 5–15)
BUN: 6 mg/dL (ref 6–20)
CO2: 26 mmol/L (ref 22–32)
Calcium: 7.9 mg/dL — ABNORMAL LOW (ref 8.9–10.3)
Chloride: 97 mmol/L — ABNORMAL LOW (ref 98–111)
Creatinine, Ser: 0.48 mg/dL — ABNORMAL LOW (ref 0.61–1.24)
GFR, Estimated: >60 mL/min (ref >60)
Glucose, Bld: 88 mg/dL (ref 70–99)
Potassium: 3.9 mmol/L (ref 3.5–5.1)
Sodium: 126 mmol/L — ABNORMAL LOW (ref 135–145)

## 2020-11-08 LAB — HEMOGLOBIN AND HEMATOCRIT, BLOOD
HCT: 24.6 % — ABNORMAL LOW (ref 39.0–52.0)
Hemoglobin: 8.4 g/dL — ABNORMAL LOW (ref 13.0–17.0)

## 2020-11-08 LAB — RETICULOCYTES
Immature Retic Fract: 42.1 % — ABNORMAL HIGH (ref 2.3–15.9)
RBC.: 1.85 MIL/uL — ABNORMAL LOW (ref 4.22–5.81)
Retic Count, Absolute: 226.6 10*3/uL — ABNORMAL HIGH (ref 19.0–186.0)
Retic Ct Pct: 12.3 % — ABNORMAL HIGH (ref 0.4–3.1)

## 2020-11-08 LAB — CBC
HCT: 21.4 % — ABNORMAL LOW (ref 39.0–52.0)
Hemoglobin: 6.9 g/dL — CL (ref 13.0–17.0)
MCH: 36.5 pg — ABNORMAL HIGH (ref 26.0–34.0)
MCHC: 32.2 g/dL (ref 30.0–36.0)
MCV: 113.2 fL — ABNORMAL HIGH (ref 80.0–100.0)
Platelets: 212 10*3/uL (ref 150–400)
RBC: 1.89 MIL/uL — ABNORMAL LOW (ref 4.22–5.81)
RDW: 26 % — ABNORMAL HIGH (ref 11.5–15.5)
WBC: 7.8 10*3/uL (ref 4.0–10.5)
nRBC: 0 % (ref 0.0–0.2)

## 2020-11-08 LAB — VITAMIN B12: Vitamin B-12: 990 pg/mL — ABNORMAL HIGH (ref 180–914)

## 2020-11-08 LAB — RESP PANEL BY RT-PCR (FLU A&B, COVID) ARPGX2
Influenza A by PCR: NEGATIVE
Influenza B by PCR: NEGATIVE
SARS Coronavirus 2 by RT PCR: NEGATIVE

## 2020-11-08 LAB — PREPARE RBC (CROSSMATCH)

## 2020-11-08 LAB — FOLATE: Folate: 6.5 ng/mL (ref >5.9)

## 2020-11-08 LAB — FERRITIN: Ferritin: 832 ng/mL — ABNORMAL HIGH (ref 24–336)

## 2020-11-08 LAB — LACTIC ACID, PLASMA: Lactic Acid, Venous: 2.9 mmol/L (ref 0.5–1.9)

## 2020-11-08 MED ORDER — CELECOXIB 100 MG PO CAPS
100.0000 mg | ORAL_CAPSULE | Freq: Two times a day (BID) | ORAL | Status: DC
  Administered 2020-11-08 – 2020-11-11 (×7): 100 mg via ORAL
  Filled 2020-11-08 (×9): qty 1

## 2020-11-08 MED ORDER — VANCOMYCIN HCL 1500 MG/300ML IV SOLN
1500.0000 mg | Freq: Two times a day (BID) | INTRAVENOUS | Status: DC
  Administered 2020-11-08: 1500 mg via INTRAVENOUS
  Filled 2020-11-08: qty 300

## 2020-11-08 MED ORDER — SODIUM CHLORIDE 0.9% IV SOLUTION: Freq: Once | INTRAVENOUS | Status: DC

## 2020-11-08 MED ORDER — SODIUM CHLORIDE 0.9 % IV SOLN
2.0000 g | INTRAVENOUS | Status: DC
  Administered 2020-11-08: 2 g via INTRAVENOUS
  Filled 2020-11-08 (×2): qty 20

## 2020-11-08 MED ORDER — ONDANSETRON HCL 4 MG/2ML IJ SOLN: 4.0000 mg | Freq: Four times a day (QID) | INTRAMUSCULAR | Status: DC | PRN

## 2020-11-08 MED ORDER — ONDANSETRON HCL 4 MG PO TABS: 4.0000 mg | ORAL_TABLET | Freq: Four times a day (QID) | ORAL | Status: DC | PRN

## 2020-11-08 MED ORDER — OXYCODONE HCL 5 MG PO TABS
5.0000 mg | ORAL_TABLET | ORAL | Status: DC | PRN
  Administered 2020-11-08 – 2020-11-11 (×11): 5 mg via ORAL
  Filled 2020-11-08 (×12): qty 1

## 2020-11-08 MED ORDER — ACETAMINOPHEN 650 MG RE SUPP: 650.0000 mg | Freq: Four times a day (QID) | RECTAL | Status: DC | PRN

## 2020-11-08 MED ORDER — ACETAMINOPHEN 325 MG PO TABS
650.0000 mg | ORAL_TABLET | Freq: Four times a day (QID) | ORAL | Status: DC | PRN
  Administered 2020-11-09 – 2020-11-10 (×2): 650 mg via ORAL
  Filled 2020-11-08 (×2): qty 2

## 2020-11-08 MED ORDER — SODIUM CHLORIDE 0.9 % IV SOLN
2.0000 g | Freq: Three times a day (TID) | INTRAVENOUS | Status: DC
  Administered 2020-11-08: 2 g via INTRAVENOUS
  Filled 2020-11-08: qty 2

## 2020-11-08 NOTE — Progress Notes (Signed)
[facility] PROGRESS NOTE   Kyle Pitts HDQ:[id number] DOB: 1961/03/13 DOA: 11/07/2020  PCP: [name], MD  Brief History/Interval Summary: 60 y.o. male with medical history significant of possible cirrhosis, hyponatremia.  Recently hospitalized in June for septic arthritis of the right knee with a streptococcal infection.  Seen by infectious disease.  Underwent irrigation of the joint by orthopedics.  PICC line was placed and the patient was discharged with plans for 6 weeks of IV antibiotics in the form of ceftriaxone.  Over the last few days the home health nurse has noticed some redness around the PICC joint and then noticed purulent discharge and so she has the patient to go to the emergency department.  Patient was subsequently hospitalized   Reason for Visit: Infected PICC line  Consultants: Will inform orthopedics and infectious disease  Procedures: None yet  Antibiotics: Anti-infectives (From admission, onward)    Start     Dose/Rate Route Frequency Ordered Stop   11/08/20 1200  vancomycin (VANCOREADY) IVPB 1500 mg/300 mL        1,500 mg 150 mL/hr over 120 Minutes Intravenous Every 12 hours 11/08/20 0240     11/08/20 1000  ceFEPIme (MAXIPIME) 2 g in sodium chloride 0.9 % 100 mL IVPB        2 g 200 mL/hr over 30 Minutes Intravenous Every 8 hours 11/08/20 0240     11/07/20 2345  ceFEPIme (MAXIPIME) 2 g in sodium chloride 0.9 % 100 mL IVPB        2 g 200 mL/hr over 30 Minutes Intravenous  Once 11/07/20 2339 11/08/20 0026   11/07/20 2345  vancomycin (VANCOREADY) IVPB 2000 mg/400 mL        2,000 mg 200 mL/hr over 120 Minutes Intravenous  Once 11/07/20 2339 11/08/20 0230   11/07/20 2330  vancomycin (VANCOCIN) IVPB 1000 mg/200 mL premix  Status:  Discontinued        1,000 mg 200 mL/hr over 60 Minutes Intravenous  Once 11/07/20 2318 11/07/20 2339       Subjective/Interval History: Patient denies any fever or chills.  Right knee is about the same.  He was  supposed to have follow-up with orthopedics today.  Has not noticed any worsening pain or redness.      Assessment/Plan:  PICC line infection Does not appear to be septic at this time.  PICC line has been removed.  Patient started on broad-spectrum antibiotics with vancomycin and cefepime.  Will consult infectious disease to assist with management.  Septic arthritis right knee due to strep pyogenes Seen by infectious disease during previous hospitalization and was placed on ceftriaxone to be given for 6 weeks.  Followed by Dr. [name] with orthopedics.  Was supposed to see them in the office today.  Will notify orthopedic so that they can evaluate him while he is in the hospital.  Hyponatremia Appears to be chronic for the most part.  Could be due to questionable history of liver cirrhosis.  Continue to monitor.  He is asymptomatic.  Questionable history of liver cirrhosis/alcohol dependence Drinks anywhere between 3-4 beers on a daily basis.  Occasional vodka use.  Last consumption of alcohol was 2 days ago.  Patient also held regarding his alcohol intake.    Macrocytic anemia Drop in hemoglobin to 6.9 noted.  No overt blood loss is appreciated.  1 unit of PRBC has been ordered.  Stool for occult blood.  Anemia panel reviewed.  No clear-cut deficiencies identified.   DVT Prophylaxis:  SCDs Code Status: Full code Family Communication: Discussed with the patient Disposition Plan: Hopefully return home in improved  Status is: Observation  The patient will require care spanning > 2 midnights and should be moved to inpatient because: IV treatments appropriate due to intensity of illness or inability to take PO  Dispo: The patient is from: Home              Anticipated d/c is to: Home              Patient currently is not medically stable to d/c.   Difficult to place patient No        Medications: Scheduled:  sodium chloride   Intravenous Once   celecoxib  100 mg Oral BID    Continuous:  ceFEPime (MAXIPIME) IV     vancomycin     STM:[id number] **OR** acetaminophen, ondansetron **OR** ondansetron (ZOFRAN) IV, oxyCODONE   Objective:  Vital Signs  Vitals:   11/08/20 0744 11/08/20 0745 11/08/20 0800 11/08/20 0830  BP: (!) 166/91 (!) 166/91 (!) 165/85 (!) 165/91  Pulse: 92 92 89 92  Resp: 15 16 17    Temp: 99.8 F (37.7 C)     TempSrc: Oral     SpO2: 100% 100% 100% 100%    Intake/Output Summary (Last 24 hours) at 11/08/2020 0902 Last data filed at 11/08/2020 11/10/2020 Gross per 24 hour  Intake 856.79 ml  Output --  Net 856.79 ml   There were no vitals filed for this visit.  General appearance: Awake alert.  In no distress Resp: Clear to auscultation bilaterally.  Normal effort Cardio: S1-S2 is normal regular.  No S3-S4.  No rubs murmurs or bruit GI: Abdomen is soft.  Nontender nondistended.  Bowel sounds are present normal.  No masses organomegaly Extremities: Right knee covered in dressing.  Restricted range of motion.  Mild erythema noted in the lower leg.  The PICC line site in the right arm is covered with dressing.  No erythema is noted. Neurologic: Alert and oriented x3.  No focal neurological deficits.    Lab Results:  Data Reviewed: I have personally reviewed following labs and imaging studies  CBC: Recent Labs  Lab 11/07/20 1401 11/08/20 0210  WBC 6.0 7.8  NEUTROABS 3.2  --   HGB 7.4* 6.9*  HCT 22.4* 21.4*  MCV 110.9* 113.2*  PLT 232 212    Basic Metabolic Panel: Recent Labs  Lab 11/07/20 1401 11/08/20 0210  NA 126* 126*  K 3.8 3.9  CL 96* 97*  CO2 25 26  GLUCOSE 92 88  BUN 6 6  CREATININE 0.47* 0.48*  CALCIUM 8.3* 7.9*    GFR: CrCl cannot be calculated (Unknown ideal weight.).    Anemia Panel: Recent Labs    11/08/20 0221  VITAMINB12 990*  FOLATE 6.5  FERRITIN 832*  TIBC 182*  IRON 47  RETICCTPCT 12.3*    Recent Results (from the past 240 hour(s))  Blood culture (routine x 2)     Status: None  (Preliminary result)   Collection Time: 11/07/20  2:01 PM   Specimen: BLOOD  Result Value Ref Range Status   Specimen Description   Final    BLOOD LEFT ANTECUBITAL Performed at [facility], [address], [place], [place]    Special Requests   Final    BOTTLES DRAWN AEROBIC AND ANAEROBIC Blood Culture adequate volume Performed at [facility], [address] [place], [place]    Culture  Final    NO GROWTH < 24 HOURS Performed at [facility], [address], [place], [place] [address]    Report Status PENDING  Incomplete  Resp Panel by RT-PCR (Flu A&B, Covid) Nasopharyngeal Swab     Status: None   Collection Time: 11/07/20 11:36 PM   Specimen: Nasopharyngeal Swab; Nasopharyngeal(NP) swabs in vial transport medium  Result Value Ref Range Status   SARS Coronavirus 2 by RT PCR NEGATIVE NEGATIVE Final    Comment: (NOTE) SARS-CoV-2 target nucleic acids are NOT DETECTED.  The SARS-CoV-2 RNA is generally detectable in upper respiratory specimens during the acute phase of infection. The lowest concentration of SARS-CoV-2 viral copies this assay can detect is 138 copies/mL. A negative result does not preclude SARS-Cov-2 infection and should not be used as the sole basis for treatment or other patient management decisions. A negative result may occur with  improper specimen collection/handling, submission of specimen other than nasopharyngeal swab, presence of viral mutation(s) within the areas targeted by this assay, and inadequate number of viral copies(<138 copies/mL). A negative result must be combined with clinical observations, patient history, and epidemiological information. The expected result is Negative.  Fact Sheet for Patients:  BloggerCourse.com  Fact Sheet for Healthcare Providers:  SeriousBroker.it  This test is no t yet approved or cleared by the  [place] FDA and  has been authorized for detection and/or diagnosis of SARS-CoV-2 by FDA under an Emergency Use Authorization (EUA). This EUA will remain  in effect (meaning this test can be used) for the duration of the COVID-19 declaration under Section 564(b)(1) of the Act, 21 U.S.C.section 360bbb-3(b)(1), unless the authorization is terminated  or revoked sooner.       Influenza A by PCR NEGATIVE NEGATIVE Final   Influenza B by PCR NEGATIVE NEGATIVE Final    Comment: (NOTE) The Xpert Xpress SARS-CoV-2/FLU/RSV plus assay is intended as an aid in the diagnosis of influenza from Nasopharyngeal swab specimens and should not be used as a sole basis for treatment. Nasal washings and aspirates are unacceptable for Xpert Xpress SARS-CoV-2/FLU/RSV testing.  Fact Sheet for Patients: BloggerCourse.com  Fact Sheet for Healthcare Providers: SeriousBroker.it  This test is not yet approved or cleared by the [place] FDA and has been authorized for detection and/or diagnosis of SARS-CoV-2 by FDA under an Emergency Use Authorization (EUA). This EUA will remain in effect (meaning this test can be used) for the duration of the COVID-19 declaration under Section 564(b)(1) of the Act, 21 U.S.C. section 360bbb-3(b)(1), unless the authorization is terminated or revoked.  Performed at [facility] [address] [place], [place] [address]       Radiology Studies: No results found.     LOS: 0 days   Adhrit Krenz [employer] on [email or web address]  11/08/2020, 9:02 AM

## 2020-11-08 NOTE — Progress Notes (Addendum)
1u PRBC transfusion ordered for HGB 6.9 this AM (down from 7.4 initially).  Anemia pnl drawn, results still pending.

## 2020-11-08 NOTE — ED Notes (Signed)
Breakfast tray given. °

## 2020-11-08 NOTE — H&P (Signed)
History and Physical    Kyle Pitts TDH:[id number] DOB: 1960-09-04 DOA: 11/07/2020  PCP: Ricard Dillon, MD  Patient coming from: Home  I have personally briefly reviewed patient's old medical records in [place]  Chief Complaint: PICC line infection  HPI: Kyle Pitts is a 60 y.o. male with medical history significant of possible cirrhosis, hyponatremia.  Pt with streptococcal (S. Pyo) septic arthritis of R knee on 6/10.  Repeat admission on 6/20 for RLE cellulitis, abscess and septic arthritis.  Pt ultimately discharged with PICC line with plans for 6 weeks of IV ABx at home.  Pt has been taking daily rocephin, and his knee and RLE have been improving he reports.  Wound care RN changed PICC line dressing 2 days ago, noted some redness surrounding it.  Yesterday evening she changed dressing again, and noted moderate amount of purulent discharge surrounding the PICC line.  This prompted pt to present to ED today.  No fever, no LOC, no vomiting.   ED Course: Sodium 126  HGB 7.4 down slightly from 8.6 on DC last month.  WBC 6.0.  No SIRS.  BP 172/91   Review of Systems: As per HPI, otherwise all review of systems negative.  Past Medical History:  Diagnosis Date   Cirrhosis ([place])    Hyponatremia    Septic arthritis [facility])     Past Surgical History:  Procedure Laterality Date   I & D EXTREMITY Right 10/09/2020   Procedure: IRRIGATION AND DEBRIDEMENT EXTREMITY;  Surgeon: Nicholes Stairs, MD;  Location: [facility];  Service: Orthopedics;  Laterality: Right;   KNEE ARTHROSCOPY Right 09/27/2020   Procedure: ARTHROSCOPY KNEE, [place];  Surgeon: Melina Schools, MD;  Location: [facility];  Service: Orthopedics;  Laterality: Right;   KNEE ARTHROSCOPY Right 10/09/2020   Procedure: ARTHROSCOPY KNEE,ARTHROSCOPIC I & D, CALF ABSCESS;  Surgeon: Nicholes Stairs, MD;  Location: [facility];  Service: Orthopedics;  Laterality: Right;     reports that he has never  smoked. He has never used smokeless tobacco. He reports current alcohol use of about 24.0 standard drinks of alcohol per week. He reports previous drug use. Drugs: Cocaine and Marijuana.  Allergies  Allergen Reactions   Cephalexin Nausea Only    Upset stomach    Family History  Problem Relation Age of Onset   Hepatitis Mother    Cirrhosis Mother    Stroke Father    Heart failure Father      Prior to Admission medications   Medication Sig Start Date End Date Taking? Authorizing Provider  acetaminophen (TYLENOL) 325 MG tablet Take 1-2 tablets (325-650 mg total) by mouth every 6 (six) hours as needed for mild pain (pain score 1-3 or temp > 100.5). 10/02/20   Charlyne Petrin, PA-C  albuterol (VENTOLIN HFA) 108 (90 Base) MCG/ACT inhaler Inhale 2 puffs into the lungs every 6 (six) hours as needed for wheezing or shortness of breath.    [provider]  aspirin EC 81 MG tablet Take 1 tablet (81 mg total) by mouth 2 (two) times daily. Swallow whole. 10/14/20 11/13/20  Darliss Cheney, MD  cefTRIAXone (ROCEPHIN) IVPB Inject 2 g into the vein daily. Indication:  Septic arthritis  First Dose: No Last Day of Therapy:  11/20/2020 Labs - Once weekly:  CBC/D and BMP, Labs - Every other week:  ESR and CRP Method of administration: IV Push Method of administration may be changed at the discretion of home infusion pharmacist based upon assessment  of the patient and/or caregiver's ability to self-administer the medication ordered. 10/11/20 11/20/20  Darliss Cheney, MD  celecoxib (CELEBREX) 100 MG capsule Take 1 capsule (100 mg total) by mouth 2 (two) times daily. 10/02/20   Charlyne Petrin, PA-C  HYDROcodone-acetaminophen (NORCO/VICODIN) 5-325 MG tablet Take 1 tablet by mouth every 6 (six) hours as needed for up to 20 doses for moderate pain. 10/11/20   Jonelle Sidle D, PA    Physical Exam: Vitals:   11/07/20 2305  BP: (!) 172/91  Pulse: 95  Resp: 16  Temp: 98.8 F (37.1 C)  TempSrc: Oral   SpO2: 100%    Constitutional: NAD, calm, comfortable Eyes: PERRL, lids and conjunctivae normal ENMT: Mucous membranes are moist. Posterior pharynx clear of any exudate or lesions.Normal dentition.  Neck: normal, supple, no masses, no thyromegaly Respiratory: clear to auscultation bilaterally, no wheezing, no crackles. Normal respiratory effort. No accessory muscle use.  Cardiovascular: Regular rate and rhythm, no murmurs / rubs / gallops. No extremity edema. 2+ pedal pulses. No carotid bruits.  Abdomen: no tenderness, no masses palpated. No hepatosplenomegaly. Bowel sounds positive.  Musculoskeletal: Purulent drainage at PICC line site. Skin: Edema of RLE, knee is under dressing currently, dressing is intact. Neurologic: CN 2-12 grossly intact. Sensation intact, DTR normal. Strength 5/5 in all 4.  Psychiatric: Normal judgment and insight. Alert and oriented x 3. Normal mood.    Labs on Admission: I have personally reviewed following labs and imaging studies  CBC: Recent Labs  Lab 11/07/20 1401  WBC 6.0  NEUTROABS 3.2  HGB 7.4*  HCT 22.4*  MCV 110.9*  PLT 628   Basic Metabolic Panel: Recent Labs  Lab 11/07/20 1401  NA 126*  K 3.8  CL 96*  CO2 25  GLUCOSE 92  BUN 6  CREATININE 0.47*  CALCIUM 8.3*   GFR: CrCl cannot be calculated (Unknown ideal weight.). Liver Function Tests: No results for input(s): AST, ALT, ALKPHOS, BILITOT, PROT, ALBUMIN in the last 168 hours. No results for input(s): LIPASE, AMYLASE in the last 168 hours. No results for input(s): AMMONIA in the last 168 hours. Coagulation Profile: No results for input(s): INR, PROTIME in the last 168 hours. Cardiac Enzymes: No results for input(s): CKTOTAL, CKMB, CKMBINDEX, TROPONINI in the last 168 hours. BNP (last 3 results) No results for input(s): PROBNP in the last 8760 hours. HbA1C: No results for input(s): HGBA1C in the last 72 hours. CBG: No results for input(s): GLUCAP in the last 168  hours. Lipid Profile: No results for input(s): CHOL, HDL, LDLCALC, TRIG, CHOLHDL, LDLDIRECT in the last 72 hours. Thyroid Function Tests: No results for input(s): TSH, T4TOTAL, FREET4, T3FREE, THYROIDAB in the last 72 hours. Anemia Panel: No results for input(s): VITAMINB12, FOLATE, FERRITIN, TIBC, IRON, RETICCTPCT in the last 72 hours. Urine analysis:    Component Value Date/Time   COLORURINE AMBER (A) 10/07/2020 2022   APPEARANCEUR HAZY (A) 10/07/2020 2022   LABSPEC 1.016 10/07/2020 2022   PHURINE 5.0 10/07/2020 2022   GLUCOSEU NEGATIVE 10/07/2020 2022   HGBUR NEGATIVE 10/07/2020 2022   [place] NEGATIVE 10/07/2020 2022   KETONESUR NEGATIVE 10/07/2020 2022   PROTEINUR NEGATIVE 10/07/2020 2022   NITRITE NEGATIVE 10/07/2020 2022   LEUKOCYTESUR NEGATIVE 10/07/2020 2022    Radiological Exams on Admission: No results found.  EKG: Independently reviewed.  Assessment/Plan Principal Problem:   PICC line infection Active Problems:   Septic arthritis of knee (HCC)   Hyponatremia   Anemia    PICC line infection -  Thankfully pt not septic at this point with 0/4 SIRS and a BP 172/91 BCx and line tip Cx ordered IV team pulling line now Continuing empiric cefepime + vanc started in ED (presumably organism is resistant to rocephin since he developed infection despite rocephin). Presumably pt will need several days of ABx in hospital and a "line holiday" before attempting another PICC line. Septic arthritis of knee - Due to S. Pyogenes Pt currently undergoing 6 wk course of rocephin, was scheduled to finish 8/3 Now on cefepime + vanc for the moment due to development of superimposed PICC line infection Hyponatremia - Mild, apparently chronic ? Due to underlying cirrhosis Repeat BMP in AM Anemia - Unclear cause: ? Anemia of chronic dz?  Though CBC today looks macrocytic... Down about 1g in past month since discharge Type and screen Repeat CBC in AM  DVT prophylaxis:  SCDs Code Status: Full Family Communication: No family in room Disposition Plan: Home after admit Consults called: None Admission status: Place in obs - presumably convert to IP tomorrow (since he probably needs a line holiday before attempting new PICC line placement).   Javayah Magaw Jerilynn Mages DO [facility]  How to contact the [facility] Attending or Consulting provider [place] or covering provider during after hours [place], for this patient?  Check the care team in [facility] and look for a) attending/consulting [facility] provider listed and b) the [facility] team listed Log into [email or web address]  Amion Physician Scheduling and messaging for groups and whole hospitals  On call and physician scheduling software for group practices, residents, hospitalists and other medical providers for call, clinic, rotation and shift schedules. OnCall Enterprise is a hospital-wide system for scheduling doctors and paging doctors on call. EasyPlot is for scientific plotting and data analysis.  [email or web address]  and use [place]'s universal password to access. If you do not have the password, please contact the hospital operator.  Locate the [facility] provider you are looking for under [facility] and page to a number that you can be directly reached. If you still have difficulty reaching the provider, please page the [facility] (Director on Call) for the Hospitalists listed on amion for assistance.  11/08/2020, 2:25 AM

## 2020-11-08 NOTE — ED Notes (Signed)
Spoke to IV team to confirm that pt does have a PICC single lumen in his R brachial that's 40cm/1cm in length and the hospitalist did put an order for IV team PICC removal for today. They will come to do it shortly. Hospitalist aware. Wants tip of cath saved or site cultured after picc line removal.

## 2020-11-08 NOTE — ED Notes (Signed)
Dinner tray given

## 2020-11-08 NOTE — Progress Notes (Signed)
Pharmacy Antibiotic Note  Kyle Pitts is a 60 y.o. male admitted on 11/07/2020 with sepsis/ PICC line infection. Pharmacy has been consulted for Vancomycin + Cefepime dosing.  PMH significant for septic arthritis.  He is currently on IV Rocephin as outpatient thru 11/20/20. PICC line removed 7/22.   Plan: Cefepime 2gm IV q8h Vancomycin 1500mg  IV q12h to target AUC 400-550 Check Vancomycin levels at steady state Monitor renal function and cx data       Temp (24hrs), Avg:98.8 F (37.1 C), Min:98.8 F (37.1 C), Max:98.8 F (37.1 C)  Recent Labs  Lab 11/07/20 1401 11/07/20 2335  WBC 6.0  --   CREATININE 0.47*  --   LATICACIDVEN 1.4 2.9*    CrCl cannot be calculated (Unknown ideal weight.).    Allergies  Allergen Reactions   Cephalexin Nausea Only    Upset stomach    Antimicrobials this admission: 7/22 Cefepime >>  7/22 Vancomycin >>   Dose adjustments this admission:  Microbiology results: 7/21 BCx:  7/22 Superficial cx from PICC line:   Thank you for allowing pharmacy to be a part of this patient's care.  8/22 PharmD 11/08/2020 2:41 AM

## 2020-11-08 NOTE — Consult Note (Signed)
[facility]       Reason for Consult: fever    Referring Physician: Dr. Barnie Del  Principal Problem:   PICC line infection Active Problems:   Septic arthritis of knee (HCC)   Hyponatremia   Anemia    sodium chloride   Intravenous Once   celecoxib  100 mg Oral BID    Recommendations: Continue ceftriaxone Will stop cefepime and vancomycin  Assessment: He has had some purulence from the picc line not associated with any fever, chills or leukocytosis c/w a superficial infection around the insertion site with no systemic signs.  Treatment for this is picc line removal which has been done.  At this point, unless there are any significant concerns regarding his knee or leg after orthopedics examines him, I will transition him to oral amoxicillin high dose 1000 mg po three times a day at discharge through the same stop date of 11/20/20 for his Strep pyogenes infection.    Antibiotics: Ceftriaxone since 09/28/20 to present (through 11/20/20)  HPI: Kyle Pitts is a 60 y.o. male with cirrhosis and recently diagnosed with native joint septic arthritis of the right knee on IV ceftriaxone for Strep pyogenes came to the ED at the request of home health with some purulence around the picc line insertion site.  Started about 2 days ago though the patient had not noticed much.  Initially had some erythema, then pus.  Was a small amount.  No associated fever, no chills.  He is afebrile, WBC 7.8.  No rash or diarrhea.    Review of Systems:  Constitutional: negative for fevers, chills, sweats, and anorexia Gastrointestinal: negative for nausea and diarrhea Integument/breast: negative for rash All other systems reviewed and are negative    Past Medical History:  Diagnosis Date   Cirrhosis (HCC)    Hyponatremia    Septic arthritis (HCC)     Social History   Tobacco Use   Smoking status: Never   Smokeless tobacco: Never  Vaping Use   Vaping Use: Never used   Substance Use Topics   Alcohol use: Yes    Alcohol/week: 24.0 standard drinks    Types: 24 Cans of beer per week    Comment: 3-4 when home from work   Drug use: Not Currently    Types: Cocaine, Marijuana    Comment: last used 1 month ago    Family History  Problem Relation Age of Onset   Hepatitis Mother    Cirrhosis Mother    Stroke Father    Heart failure Father     Allergies  Allergen Reactions   Cephalexin Nausea Only    Upset stomach    Physical Exam: Constitutional: in no apparent distress  Vitals:   11/08/20 1055 11/08/20 1100  BP: (!) 151/79 (!) 146/78  Pulse: 93 90  Resp: 16 16  Temp: 99.2 F (37.3 C)   SpO2:  97%   EYES: anicteric ENMT: no thrush Cardiovascular: Cor RRR Respiratory: clear; GI: Bowel sounds are normal, liver is not enlarged, spleen is not enlarged Musculoskeletal: right leg wrapped Skin: negatives: no rash  Lab Results  Component Value Date   WBC 7.8 11/08/2020   HGB 8.4 (L) 11/08/2020   HCT 24.6 (L) 11/08/2020   MCV 113.2 (H) 11/08/2020   PLT 212 11/08/2020    Lab Results  Component Value Date   CREATININE 0.48 (L) 11/08/2020   BUN 6 11/08/2020   NA 126 (L) 11/08/2020   K  3.9 11/08/2020   CL 97 (L) 11/08/2020   CO2 26 11/08/2020    Lab Results  Component Value Date   ALT 37 10/11/2020   AST 63 (H) 10/11/2020   ALKPHOS 275 (H) 10/11/2020     Microbiology: Recent Results (from the past 240 hour(s))  Blood culture (routine x 2)     Status: None (Preliminary result)   Collection Time: 11/07/20  2:01 PM   Specimen: BLOOD  Result Value Ref Range Status   Specimen Description   Final    BLOOD LEFT ANTECUBITAL Performed at [facility], [address], [place], [place] [address]    Special Requests   Final    BOTTLES DRAWN AEROBIC AND ANAEROBIC Blood Culture adequate volume Performed at [facility], [address] [place], [place] [address]    Culture   Final    NO GROWTH <  24 HOURS Performed at [facility], [address] [place], [place] [address]    Report Status PENDING  Incomplete  Resp Panel by RT-PCR (Flu A&B, Covid) Nasopharyngeal Swab     Status: None   Collection Time: 11/07/20 11:36 PM   Specimen: Nasopharyngeal Swab; Nasopharyngeal(NP) swabs in vial transport medium  Result Value Ref Range Status   SARS Coronavirus 2 by RT PCR NEGATIVE NEGATIVE Final    Comment: (NOTE) SARS-CoV-2 target nucleic acids are NOT DETECTED.  The SARS-CoV-2 RNA is generally detectable in upper respiratory specimens during the acute phase of infection. The lowest concentration of SARS-CoV-2 viral copies this assay can detect is 138 copies/mL. A negative result does not preclude SARS-Cov-2 infection and should not be used as the sole basis for treatment or other patient management decisions. A negative result may occur with  improper specimen collection/handling, submission of specimen other than nasopharyngeal swab, presence of viral mutation(s) within the areas targeted by this assay, and inadequate number of viral copies(<138 copies/mL). A negative result must be combined with clinical observations, patient history, and epidemiological information. The expected result is Negative.  Fact Sheet for Patients:  BloggerCourse.com  Fact Sheet for Healthcare Providers:  SeriousBroker.it  This test is no t yet approved or cleared by the [place] FDA and  has been authorized for detection and/or diagnosis of SARS-CoV-2 by FDA under an Emergency Use Authorization (EUA). This EUA will remain  in effect (meaning this test can be used) for the duration of the COVID-19 declaration under Section 564(b)(1) of the Act, 21 U.S.C.section 360bbb-3(b)(1), unless the authorization is terminated  or revoked sooner.       Influenza A by PCR NEGATIVE NEGATIVE Final   Influenza B by PCR NEGATIVE NEGATIVE Final     Comment: (NOTE) The Xpert Xpress SARS-CoV-2/FLU/RSV plus assay is intended as an aid in the diagnosis of influenza from Nasopharyngeal swab specimens and should not be used as a sole basis for treatment. Nasal washings and aspirates are unacceptable for Xpert Xpress SARS-CoV-2/FLU/RSV testing.  Fact Sheet for Patients: BloggerCourse.com  Fact Sheet for Healthcare Providers: SeriousBroker.it  This test is not yet approved or cleared by the [place] FDA and has been authorized for detection and/or diagnosis of SARS-CoV-2 by FDA under an Emergency Use Authorization (EUA). This EUA will remain in effect (meaning this test can be used) for the duration of the COVID-19 declaration under Section 564(b)(1) of the Act, 21 U.S.C. section 360bbb-3(b)(1), unless the authorization is terminated or revoked.  Performed at [facility], [address]  [place], [place] [address]     Gardiner Barefoot, MD [facility] [email or web address] 11/08/2020, 1:45 PM

## 2020-11-08 NOTE — ED Notes (Signed)
Hospitalist at bedside 

## 2020-11-08 NOTE — Progress Notes (Addendum)
Subjective:  Kyle Pitts is a 60 y.o. male,   presenting to the ER for a PICC line infection .  Patient was seen by [place] on 10/09/2020 for a  right knee arthroscopy for septic joint as well as a right lateral partial fasciotomy.  We have followed him in our office  and since then we have been doing weekly wound checks.  He was noted to have significant eschar and hematoma at his fasciotomy site previously.  Also had persistent drainage at his lateral portal.  He has been on ceftriaxone for treatment of his septic joint which was scheduled to end on 11/20/2020.  He has had great improvement in his erythema as well as his edema that he previously had secondary to to his right knee infection.      Patient reports pain as mild to moderate.  Reports still difficulty with full range of motion of the knee and swelling at the knee.  Reports that his incision opened up few days ago and we had a appointment scheduled for today to reevaluate this patient.  Reports since he had drainage coming from his PICC line he was told to go to the ER.  Denies fever or chills.  Denies numbness or tingling.  Objective:   VITALS:   Vitals:   11/08/20 1055 11/08/20 1100 11/08/20 1300 11/08/20 1400  BP: (!) 151/79 (!) 146/78 (!) 153/93 (!) 128/91  Pulse: 93 90 95 95  Resp: 16 16  16   Temp: 99.2 F (37.3 C)     TempSrc: Oral     SpO2:  97% 99% 100%   Constitutional: General Appearance: healthy-appearing, well-nourished, and well-developed. Level of Distress: no acute distress. Eyes: Lens (normal) clear: both eyes. Head: Head: normocephalic and atraumatic. Lungs: Respiratory effort: no dyspnea. Skin: Inspection and palpation: no rash, lesions Neurologic: Cranial Nerves: grossly intact. Sensation: grossly intact. Psychiatric: Insight: good judgement and insight. Mental Status: normal mood and affect and active and alert. Orientation: to time, place, and person.  Right Knee  Inspection: Moderate effusion, no  erythema healed anterior portal incisions.  Small amount of drainage noted at the lateral portal incision. Lateral fasciotomy incision with dehiscence and significant hematoma present.  No signs of infection.  Bloody drainage noted.  Eschar noted on wound edges.  Mild edema at the ankle and lateral thigh. Palpation: No significant tenderness to the knee. ROM 20  to 80    intact extensor mech No calf swelling or tenderness  Distal ankle and foot strength normal Sensation is intact to light touch Extremity is warm and well perfuse      Lab Results  Component Value Date   WBC 7.8 11/08/2020   HGB 8.4 (L) 11/08/2020   HCT 24.6 (L) 11/08/2020   MCV 113.2 (H) 11/08/2020   PLT 212 11/08/2020   BMET    Component Value Date/Time   NA 126 (L) 11/08/2020 0210   K 3.9 11/08/2020 0210   CL 97 (L) 11/08/2020 0210   CO2 26 11/08/2020 0210   GLUCOSE 88 11/08/2020 0210   BUN 6 11/08/2020 0210   CREATININE 0.48 (L) 11/08/2020 0210   CALCIUM 7.9 (L) 11/08/2020 0210   GFRNONAA >60 11/08/2020 0210   GFRAA  05/13/2010 1718    >60        The eGFR has been calculated using the MDRD equation. This calculation has not been validated in all clinical situations. eGFR's persistently <60 mL/min signify possible Chronic Kidney Disease.     Assessment/Plan:  Principal Problem:   PICC line infection Active Problems:   Septic arthritis of knee (HCC)   Hyponatremia   Anemia   Up with therapy  In the ER hematoma was partially removed and dressing changed with a wet-to-dry dressing temporarily.  We have recommended hydrotherapy for his lateral right lower leg wound and wound care.  This order was signed and held.  Also recommend nutritional supplements such as Ensure or nutrition consult.  Weightbearing Status: Weightbearing and range of motion as tolerated DVT Prophylaxis: Per primary team  Will follow along, message for questions.  May consider plastic surgery consult after  debridement by hydrotherapy team.    Kyle Pitts 11/08/2020, 4:38 PM  Kyle Sidle PA-C  Physician Assistant with Dr. Rogers, [place]

## 2020-11-08 NOTE — ED Notes (Addendum)
Iv team nursr at bedside to take PICC out

## 2020-11-09 ENCOUNTER — Other Ambulatory Visit: Payer: Self-pay

## 2020-11-09 DIAGNOSIS — M00261 Other streptococcal arthritis, right knee: Secondary | ICD-10-CM

## 2020-11-09 DIAGNOSIS — E871 Hypo-osmolality and hyponatremia: Secondary | ICD-10-CM

## 2020-11-09 LAB — BPAM RBC
Blood Product Expiration Date: 202208112359
ISSUE DATE / TIME: 202207220722
Unit Type and Rh: 6200

## 2020-11-09 LAB — TYPE AND SCREEN
ABO/RH(D): A POS
Antibody Screen: NEGATIVE
Unit division: 0

## 2020-11-09 LAB — BASIC METABOLIC PANEL
Anion gap: 6 (ref 5–15)
BUN: 7 mg/dL (ref 6–20)
CO2: 22 mmol/L (ref 22–32)
Calcium: 8.3 mg/dL — ABNORMAL LOW (ref 8.9–10.3)
Chloride: 98 mmol/L (ref 98–111)
Creatinine, Ser: 0.37 mg/dL — ABNORMAL LOW (ref 0.61–1.24)
GFR, Estimated: >60 mL/min (ref >60)
Glucose, Bld: 96 mg/dL (ref 70–99)
Potassium: 3.8 mmol/L (ref 3.5–5.1)
Sodium: 126 mmol/L — ABNORMAL LOW (ref 135–145)

## 2020-11-09 LAB — CBC
HCT: 24.3 % — ABNORMAL LOW (ref 39.0–52.0)
Hemoglobin: 8.1 g/dL — ABNORMAL LOW (ref 13.0–17.0)
MCH: 36.8 pg — ABNORMAL HIGH (ref 26.0–34.0)
MCHC: 33.3 g/dL (ref 30.0–36.0)
MCV: 110.5 fL — ABNORMAL HIGH (ref 80.0–100.0)
Platelets: 225 10*3/uL (ref 150–400)
RBC: 2.2 MIL/uL — ABNORMAL LOW (ref 4.22–5.81)
RDW: 25.8 % — ABNORMAL HIGH (ref 11.5–15.5)
WBC: 8.4 10*3/uL (ref 4.0–10.5)
nRBC: 0 % (ref 0.0–0.2)

## 2020-11-09 MED ORDER — AMOXICILLIN 500 MG PO CAPS
1000.0000 mg | ORAL_CAPSULE | Freq: Three times a day (TID) | ORAL | Status: DC
  Administered 2020-11-09 – 2020-11-11 (×7): 1000 mg via ORAL
  Filled 2020-11-09 (×8): qty 2

## 2020-11-09 NOTE — Plan of Care (Signed)
  Problem: Health Behavior/Discharge Planning: Goal: Ability to manage health-related needs will improve Outcome: Progressing   Problem: Clinical Measurements: Goal: Will remain free from infection Outcome: Progressing   Problem: Activity: Goal: Risk for activity intolerance will decrease Outcome: Progressing   Problem: Nutrition: Goal: Adequate nutrition will be maintained Outcome: Progressing   Problem: Pain Managment: Goal: General experience of comfort will improve Outcome: Progressing   

## 2020-11-09 NOTE — Progress Notes (Signed)
[facility] PROGRESS NOTE   Kyle Pitts CZY:[id number] DOB: January 14, 1961 DOA: 11/07/2020  PCP: [name], MD  Brief History/Interval Summary: 60 y.o. male with medical history significant of possible cirrhosis, hyponatremia.  Recently hospitalized in June for septic arthritis of the right knee with a streptococcal infection.  Seen by infectious disease.  Underwent irrigation of the joint by orthopedics.  PICC line was placed and the patient was discharged with plans for 6 weeks of IV antibiotics in the form of ceftriaxone.  Over the last few days the home health nurse has noticed some redness around the PICC joint and then noticed purulent discharge and so she has the patient to go to the emergency department.  Patient was subsequently hospitalized   Reason for Visit: Infected PICC line  Consultants: orthopedics and infectious disease  Procedures: None yet  Antibiotics: Anti-infectives (From admission, onward)    Start     Dose/Rate Route Frequency Ordered Stop   11/08/20 1500  cefTRIAXone (ROCEPHIN) 2 g in sodium chloride 0.9 % 100 mL IVPB        2 g 200 mL/hr over 30 Minutes Intravenous Every 24 hours 11/08/20 1418     11/08/20 1200  vancomycin (VANCOREADY) IVPB 1500 mg/300 mL  Status:  Discontinued        1,500 mg 150 mL/hr over 120 Minutes Intravenous Every 12 hours 11/08/20 0240 11/08/20 1418   11/08/20 1000  ceFEPIme (MAXIPIME) 2 g in sodium chloride 0.9 % 100 mL IVPB  Status:  Discontinued        2 g 200 mL/hr over 30 Minutes Intravenous Every 8 hours 11/08/20 0240 11/08/20 1418   11/07/20 2345  ceFEPIme (MAXIPIME) 2 g in sodium chloride 0.9 % 100 mL IVPB        2 g 200 mL/hr over 30 Minutes Intravenous  Once 11/07/20 2339 11/08/20 0026   11/07/20 2345  vancomycin (VANCOREADY) IVPB 2000 mg/400 mL        2,000 mg 200 mL/hr over 120 Minutes Intravenous  Once 11/07/20 2339 11/08/20 0230   11/07/20 2330  vancomycin (VANCOCIN) IVPB 1000 mg/200 mL premix  Status:   Discontinued        1,000 mg 200 mL/hr over 60 Minutes Intravenous  Once 11/07/20 2318 11/07/20 2339       Subjective/Interval History: Patient feels well.  Denies any complaints.  Right leg feels about the same.  No significant pain issues.  No nausea vomiting or diarrhea.     Assessment/Plan:  PICC line infection No evidence for sepsis.  PICC line was removed.  Patient seen by infectious disease.  Cultures negative so far.  Antibiotics changed back from vancomycin and cefepime to ceftriaxone.    Septic arthritis right knee due to strep pyogenes Seen by infectious disease during previous hospitalization and was placed on ceftriaxone to be given for 6 weeks.  Followed by Dr. [name] with orthopedics.  Was supposed to see orthopedics in their office on 7/22.  They were consulted to see the patient in house.  They have recommended hydrotherapy for wound care.  This has been ordered by them. Patient also seen by ID and transitioned the patient back to ceftriaxone.  Orthopedics did not see any concern for active infection when they examined the wound.  ID did recommend transitioning patient to high-dose amoxicillin orally if that were the case.  We will initiate this today.  Hyponatremia Appears to be chronic for the most part.  Could be due to questionable  history of liver cirrhosis.  Stable.  Questionable history of liver cirrhosis/alcohol dependence Drinks anywhere between 3-4 beers on a daily basis.  Occasional vodka use.  Last consumption of alcohol was 2 days ago.  Patient also counseled regarding his alcohol intake.    Macrocytic anemia Drop in hemoglobin to 6.9 noted.  No overt blood loss is appreciated.  He was transfused 1 unit of blood with appropriate response in hemoglobin.  No overt bleeding noted.   Anemia panel reviewed.  No clear-cut deficiencies identified.   DVT Prophylaxis: SCDs Code Status: Full code Family Communication: Discussed with the patient Disposition Plan:  Hopefully return home when improved  Status is: Inpatient  Remains inpatient appropriate because:Inpatient level of care appropriate due to severity of illness  Dispo:  Patient From: Home  Planned Disposition: Home  Medically stable for discharge: No          Medications: Scheduled:  sodium chloride   Intravenous Once   celecoxib  100 mg Oral BID   Continuous:  cefTRIAXone (ROCEPHIN)  IV Stopped (11/08/20 2155)   NWG:[id number]:acetaminophen **OR** acetaminophen, ondansetron **OR** ondansetron (ZOFRAN) IV, oxyCODONE   Objective:  Vital Signs  Vitals:   11/09/20 0505 11/09/20 0641 11/09/20 0900 11/09/20 1005  BP:  (!) 162/86  140/80  Pulse: 74 74  80  Resp:  18  18  Temp:  98.3 F (36.8 C)  98.8 F (37.1 C)  TempSrc:  Oral  Oral  SpO2: 97% 100%  100%  Weight:   91.3 kg   Height:   6' (1.829 m)     Intake/Output Summary (Last 24 hours) at 11/09/2020 1120 Last data filed at 11/08/2020 2155 Gross per 24 hour  Intake 400.72 ml  Output 2950 ml  Net -2549.28 ml    Filed Weights   11/09/20 0900  Weight: 91.3 kg    General appearance: Awake alert.  In no distress Resp: Clear to auscultation bilaterally.  Normal effort Cardio: S1-S2 is normal regular.  No S3-S4.  No rubs murmurs or bruit GI: Abdomen is soft.  Nontender nondistended.  Bowel sounds are present normal.  No masses organomegaly Extremities: Right leg covered in dressing Neurologic: Alert and oriented x3.  No focal neurological deficits.    Lab Results:  Data Reviewed: I have personally reviewed following labs and imaging studies  CBC: Recent Labs  Lab 11/07/20 1401 11/08/20 0210 11/08/20 1329 11/09/20 0500  WBC 6.0 7.8  --  8.4  NEUTROABS 3.2  --   --   --   HGB 7.4* 6.9* 8.4* 8.1*  HCT 22.4* 21.4* 24.6* 24.3*  MCV 110.9* 113.2*  --  110.5*  PLT 232 212  --  225     Basic Metabolic Panel: Recent Labs  Lab 11/07/20 1401 11/08/20 0210 11/09/20 0500  NA 126* 126* 126*  K 3.8 3.9 3.8  CL  96* 97* 98  CO2 25 26 22   GLUCOSE 92 88 96  BUN 6 6 7   CREATININE 0.47* 0.48* 0.37*  CALCIUM 8.3* 7.9* 8.3*     GFR: Estimated Creatinine Clearance: 110.5 mL/min (A) (by C-G formula based on SCr of 0.37 mg/dL (L)).    Anemia Panel: Recent Labs    11/08/20 0221  VITAMINB12 990*  FOLATE 6.5  FERRITIN 832*  TIBC 182*  IRON 47  RETICCTPCT 12.3*     Recent Results (from the past 240 hour(s))  Blood culture (routine x 2)     Status: None (Preliminary result)   Collection Time: 11/07/20  2:01 PM   Specimen: BLOOD  Result Value Ref Range Status   Specimen Description   Final    BLOOD LEFT ANTECUBITAL Performed at [facility], [address] [place], [place] [address]    Special Requests   Final    BOTTLES DRAWN AEROBIC AND ANAEROBIC Blood Culture adequate volume Performed at [facility], [address], [place], [place] [address]    Culture   Final    NO GROWTH 2 DAYS Performed at [facility], [address] [place], [place] [address]    Report Status PENDING  Incomplete  Blood culture (routine x 2)     Status: None (Preliminary result)   Collection Time: 11/07/20 11:35 PM   Specimen: BLOOD  Result Value Ref Range Status   Specimen Description   Final    BLOOD LEFT ANTECUBITAL Performed at [facility], [address], [place], [place] [address]    Special Requests   Final    BOTTLES DRAWN AEROBIC AND ANAEROBIC Blood Culture adequate volume Performed at [facility], [address], [place], [place] [address]    Culture   Final    NO GROWTH 1 DAY Performed at [facility], [address], [place], [place] [address]    Report Status PENDING  Incomplete  Resp Panel by RT-PCR (Flu A&B, Covid) Nasopharyngeal Swab     Status: None   Collection Time: 11/07/20 11:36 PM   Specimen: Nasopharyngeal Swab; Nasopharyngeal(NP) swabs in vial transport medium  Result Value Ref Range  Status   SARS Coronavirus 2 by RT PCR NEGATIVE NEGATIVE Final    Comment: (NOTE) SARS-CoV-2 target nucleic acids are NOT DETECTED.  The SARS-CoV-2 RNA is generally detectable in upper respiratory specimens during the acute phase of infection. The lowest concentration of SARS-CoV-2 viral copies this assay can detect is 138 copies/mL. A negative result does not preclude SARS-Cov-2 infection and should not be used as the sole basis for treatment or other patient management decisions. A negative result may occur with  improper specimen collection/handling, submission of specimen other than nasopharyngeal swab, presence of viral mutation(s) within the areas targeted by this assay, and inadequate number of viral copies(<138 copies/mL). A negative result must be combined with clinical observations, patient history, and epidemiological information. The expected result is Negative.  Fact Sheet for Patients:  BloggerCourse.com  Fact Sheet for Healthcare Providers:  SeriousBroker.it  This test is no t yet approved or cleared by the [place] FDA and  has been authorized for detection and/or diagnosis of SARS-CoV-2 by FDA under an Emergency Use Authorization (EUA). This EUA will remain  in effect (meaning this test can be used) for the duration of the COVID-19 declaration under Section 564(b)(1) of the Act, 21 U.S.C.section 360bbb-3(b)(1), unless the authorization is terminated  or revoked sooner.       Influenza A by PCR NEGATIVE NEGATIVE Final   Influenza B by PCR NEGATIVE NEGATIVE Final    Comment: (NOTE) The Xpert Xpress SARS-CoV-2/FLU/RSV plus assay is intended as an aid in the diagnosis of influenza from Nasopharyngeal swab specimens and should not be used as a sole basis for treatment. Nasal washings and aspirates are unacceptable for Xpert Xpress SARS-CoV-2/FLU/RSV testing.  Fact Sheet for  Patients: BloggerCourse.com  Fact Sheet for Healthcare Providers: SeriousBroker.it  This test is not yet approved or cleared by the [place] FDA and has been authorized for detection and/or diagnosis of SARS-CoV-2 by FDA  under an Emergency Use Authorization (EUA). This EUA will remain in effect (meaning this test can be used) for the duration of the COVID-19 declaration under Section 564(b)(1) of the Act, 21 U.S.C. section 360bbb-3(b)(1), unless the authorization is terminated or revoked.  Performed at [facility], [address], [place], [place] [address]        Radiology Studies: No results found.     LOS: 1 day   Kabir Brannock [employer] on [email or web address]  11/09/2020, 11:20 AM

## 2020-11-09 NOTE — Progress Notes (Addendum)
Physical Therapy Wound Evaluation and Tx Patient Details  Name: Kyle Pitts MRN: [id number] Date of Birth: 04/11/1961  Today's Date: 11/09/2020 Time:  -     Subjective  Subjective Assessment Subjective: Hey! Patient and Family Stated Goals: wound healed and get back to work Date of Onset:  (present on admission) Prior Treatments: fasciotomy 10/09/20  Pain Score:    Wound Assessment  Wound / Incision (Open or Dehisced) 11/09/20 Incision - Open Pretibial Proximal;Right;Lateral;Upper PT ONLY (Active)  Dressing Type ABD;Gauze (Comment);Moist to dry;Non adherent 11/09/20 1400  Dressing Changed Changed 11/09/20 1400  Dressing Change Frequency Twice a day 11/09/20 1400  Site / Wound Assessment Pink 11/09/20 1400  % Wound base Red or Granulating 0% 11/09/20 1400  % Wound base Yellow/Fibrinous Exudate 30% 11/09/20 1400  % Wound base Black/Eschar 10% 11/09/20 1400  % Wound base Other/Granulation Tissue (Comment) 60% 11/09/20 1400  Peri-wound Assessment Erythema (blanchable);Intact;Edema 11/09/20 1400  Wound Length (cm) 10.5 cm 11/09/20 1400  Wound Width (cm) 5 cm 11/09/20 1400  Wound Depth (cm) 0.3 cm 11/09/20 1400  Wound Volume (cm^3) 15.75 cm^3 11/09/20 1400  Wound Surface Area (cm^2) 52.5 cm^2 11/09/20 1400  Margins Unattached edges (unapproximated) 11/09/20 1400  Closure None 11/09/20 1400  Drainage Amount Moderate 11/09/20 1400  Drainage Description Sanguineous;No odor 11/09/20 1400  Treatment Cleansed;Debridement (Selective);Hydrotherapy (Pulse lavage);Packing (Saline gauze) 11/09/20 1400     Incision (Closed) 09/27/20 Knee Right (Active)  Dressing Type Gauze (Comment) 10/13/20 2027  Dressing Clean;Dry;Intact 10/13/20 2027  Site / Wound Assessment Dressing in place / Unable to assess 10/12/20 2034  Margins Attached edges (approximated) 10/12/20 2034  Drainage Amount None 10/12/20 2034     Incision (Closed) 10/09/20 Leg Right (Active)  Dressing Type Adhesive bandage  10/14/20 0800  Dressing Clean;Dry;Intact 10/14/20 0800  Site / Wound Assessment Dressing in place / Unable to assess 10/14/20 0800  Drainage Amount None 10/14/20 0800   Hydrotherapy Pulsed lavage therapy - wound location: R anterior-lateral lower leg Pulsed Lavage with Suction (psi): 8 psi Pulsed Lavage with Suction - Normal Saline Used: 1000 mL Pulsed Lavage Tip: Tip with splash shield Selective Debridement Selective Debridement - Location: same as wound location Selective Debridement - Tools Used: Forceps, Scissors Selective Debridement - Tissue Removed: coagulated blood, slough, black eschar    Wound Assessment and Plan  Wound Therapy - Assess/Plan/Recommendations Wound Therapy - Clinical Statement: 60 y.o. male adm with PICC line infection, RLE wound/open incision s/p recent fasciotoy.PMH: cirrhosis, hyponatremia, obesity, ETOH, R TKA/multiple knee surgeries, R LE fasciotomy anterior compartment on 10/09/20. Wound Therapy - Functional Problem List: decr mobility/strength/ROM  RLE Factors Delaying/Impairing Wound Healing: Infection - systemic/local, Substance abuse, Multiple medical problems Hydrotherapy Plan: Debridement, Dressing change, Patient/family education, Pulsatile lavage with suction Wound Therapy - Frequency: 6X / week Wound Therapy - Follow Up Recommendations: dressing changes by RN, dressing changes by family/patient  Wound Therapy Goals- Improve the function of patient's integumentary system by progressing the wound(s) through the phases of wound healing (inflammation - proliferation - remodeling) by: Wound Therapy Goals - Improve the function of patient's integumentary system by progressing the wound(s) through the phases of wound healing by: Decrease Necrotic Tissue to: 70 Decrease Necrotic Tissue - Progress: Goal set today Increase Granulation Tissue to: 30 Increase Granulation Tissue - Progress: Goal set today Improve Drainage Characteristics: Min Improve  Drainage Characteristics - Progress: Goal set today Patient/Family will be able to : verbalize dressing changes Patient/Family Instruction Goal - Progress: Goal set today Goals/treatment plan/discharge plan  were made with and agreed upon by patient/family: Yes Time For Goal Achievement: 2 weeks Wound Therapy - Potential for Goals: Good  Goals will be updated until maximal potential achieved or discharge criteria met.  Discharge criteria: when goals achieved, discharge from hospital, MD decision/surgical intervention, no progress towards goals, refusal/missing three consecutive treatments without notification or medical reason.  GP     Charges       temporal arteritis   [facility] 11/09/2020, 2:34 PM

## 2020-11-10 DIAGNOSIS — T80219D Unspecified infection due to central venous catheter, subsequent encounter: Secondary | ICD-10-CM

## 2020-11-10 LAB — CBC
HCT: 25.9 % — ABNORMAL LOW (ref 39.0–52.0)
Hemoglobin: 8.5 g/dL — ABNORMAL LOW (ref 13.0–17.0)
MCH: 36.8 pg — ABNORMAL HIGH (ref 26.0–34.0)
MCHC: 32.8 g/dL (ref 30.0–36.0)
MCV: 112.1 fL — ABNORMAL HIGH (ref 80.0–100.0)
Platelets: 220 10*3/uL (ref 150–400)
RBC: 2.31 MIL/uL — ABNORMAL LOW (ref 4.22–5.81)
RDW: 24.7 % — ABNORMAL HIGH (ref 11.5–15.5)
WBC: 7.7 10*3/uL (ref 4.0–10.5)
nRBC: 0 % (ref 0.0–0.2)

## 2020-11-10 MED ORDER — BACITRACIN-NEOMYCIN-POLYMYXIN 400-5-5000 EX OINT
TOPICAL_OINTMENT | Freq: Three times a day (TID) | CUTANEOUS | Status: DC
  Administered 2020-11-10 – 2020-11-11 (×5): 1 via TOPICAL

## 2020-11-10 NOTE — Progress Notes (Signed)
[facility] PROGRESS NOTE   MD [name]:[id number] DOB: 1960-08-13 DOA: 11/07/2020  PCP: [name], MD  Brief History/Interval Summary: 60 y.o. male with medical history significant of possible cirrhosis, hyponatremia.  Recently hospitalized in June for septic arthritis of the right knee with a streptococcal infection.  Seen by infectious disease.  Underwent irrigation of the joint by orthopedics.  PICC line was placed and the patient was discharged with plans for 6 weeks of IV antibiotics in the form of ceftriaxone.  Over the last few days the home health nurse has noticed some redness around the PICC joint and then noticed purulent discharge and so she has the patient to go to the emergency department.  Patient was subsequently hospitalized   Reason for Visit: Infected PICC line  Consultants: orthopedics and infectious disease  Procedures: None yet  Antibiotics: Anti-infectives (From admission, onward)    Start     Dose/Rate Route Frequency Ordered Stop   11/09/20 1400  amoxicillin (AMOXIL) capsule 1,000 mg        1,000 mg Oral Every 8 hours 11/09/20 1131     11/08/20 1500  cefTRIAXone (ROCEPHIN) 2 g in sodium chloride 0.9 % 100 mL IVPB  Status:  Discontinued        2 g 200 mL/hr over 30 Minutes Intravenous Every 24 hours 11/08/20 1418 11/09/20 1131   11/08/20 1200  vancomycin (VANCOREADY) IVPB 1500 mg/300 mL  Status:  Discontinued        1,500 mg 150 mL/hr over 120 Minutes Intravenous Every 12 hours 11/08/20 0240 11/08/20 1418   11/08/20 1000  ceFEPIme (MAXIPIME) 2 g in sodium chloride 0.9 % 100 mL IVPB  Status:  Discontinued        2 g 200 mL/hr over 30 Minutes Intravenous Every 8 hours 11/08/20 0240 11/08/20 1418   11/07/20 2345  ceFEPIme (MAXIPIME) 2 g in sodium chloride 0.9 % 100 mL IVPB        2 g 200 mL/hr over 30 Minutes Intravenous  Once 11/07/20 2339 11/08/20 0026   11/07/20 2345  vancomycin (VANCOREADY) IVPB 2000 mg/400 mL        2,000 mg 200  mL/hr over 120 Minutes Intravenous  Once 11/07/20 2339 11/08/20 0230   11/07/20 2330  vancomycin (VANCOCIN) IVPB 1000 mg/200 mL premix  Status:  Discontinued        1,000 mg 200 mL/hr over 60 Minutes Intravenous  Once 11/07/20 2318 11/07/20 2339       Subjective/Interval History: Patient feels well.  Underwent hydrotherapy to his right lower extremity wound.  Denies any nausea vomiting.  No fever or chills.  Pain is well controlled.   Assessment/Plan:  PICC line infection No evidence for sepsis.  PICC line was removed.  Patient seen by infectious disease.  Cultures negative so far.  Antibiotics changed back from vancomycin and cefepime to ceftriaxone.   Yellowish exudate in the PICC line site.  Bacitracin ointment ordered.  Septic arthritis right knee due to strep pyogenes Seen by infectious disease during previous hospitalization and was placed on ceftriaxone to be given for 6 weeks.  Followed by Dr. [name] with orthopedics.  Was supposed to see orthopedics in their office on 7/22.  Orthopedics was consulted and they saw the patient on 7/23.  Hydrotherapy was ordered by them.  Patient underwent 1 session yesterday.  Await further input from orthopedics. Patient is also seen by infectious disease.  Considering that there was no evidence of active infection on  examination of the right leg patient was changed over to oral amoxicillin per ID recommendation.  This will need to continue to the original end date which was 11/20/2020.    Hyponatremia Appears to be chronic for the most part.  Could be due to questionable history of liver cirrhosis.  Stable.  Questionable history of liver cirrhosis/alcohol dependence Drinks anywhere between 3-4 beers on a daily basis.  Occasional vodka use. Patient counseled regarding his alcohol intake.    Macrocytic anemia Drop in hemoglobin to 6.9 noted.  No overt blood loss is appreciated.  He was transfused 1 unit of blood with appropriate response in  hemoglobin.  No overt bleeding noted.   Anemia panel reviewed.  No clear-cut deficiencies identified.   DVT Prophylaxis: SCDs Code Status: Full code Family Communication: Discussed with the patient Disposition Plan: Hopefully return home when improved and when cleared by orthopedics.  Hopefully in the next 24 to 48 hours.  Continue to mobilize.  Status is: Inpatient  Remains inpatient appropriate because:Inpatient level of care appropriate due to severity of illness  Dispo:  Patient From: Home  Planned Disposition: Home  Medically stable for discharge: No          Medications: Scheduled:  sodium chloride   Intravenous Once   amoxicillin  1,000 mg Oral Q8H   celecoxib  100 mg Oral BID   neomycin-bacitracin-polymyxin   Topical TID   Continuous:   PPJ:[id number] **OR** acetaminophen, ondansetron **OR** ondansetron (ZOFRAN) IV, oxyCODONE   Objective:  Vital Signs  Vitals:   11/09/20 1257 11/09/20 1802 11/09/20 2111 11/10/20 0523  BP: (!) 137/93 (!) 145/83 (!) 145/78 (!) 150/84  Pulse: 79 86 92 82  Resp: 18 18 18 17   Temp: 98.1 F (36.7 C) 98 F (36.7 C) 99.1 F (37.3 C) 98.5 F (36.9 C)  TempSrc: Oral Oral Oral Oral  SpO2: 97% 100% 100% 100%  Weight:      Height:        Intake/Output Summary (Last 24 hours) at 11/10/2020 1033 Last data filed at 11/10/2020 1023 Gross per 24 hour  Intake 1310 ml  Output 3550 ml  Net -2240 ml    Filed Weights   11/09/20 0900  Weight: 91.3 kg    General appearance: Awake alert.  In no distress Resp: Clear to auscultation bilaterally.  Normal effort Cardio: S1-S2 is normal regular.  No S3-S4.  No rubs murmurs or bruit GI: Abdomen is soft.  Nontender nondistended.  Bowel sounds are present normal.  No masses organomegaly Extremities: right leg covered in dressing.  Dressing from the PICC site in the right upper arm was removed and shows yellowish exudate.  No active drainage noted. Neurologic: Alert and oriented x3.   No focal neurological deficits.     Lab Results:  Data Reviewed: I have personally reviewed following labs and imaging studies  CBC: Recent Labs  Lab 11/07/20 1401 11/08/20 0210 11/08/20 1329 11/09/20 0500 11/10/20 0459  WBC 6.0 7.8  --  8.4 7.7  NEUTROABS 3.2  --   --   --   --   HGB 7.4* 6.9* 8.4* 8.1* 8.5*  HCT 22.4* 21.4* 24.6* 24.3* 25.9*  MCV 110.9* 113.2*  --  110.5* 112.1*  PLT 232 212  --  225 220     Basic Metabolic Panel: Recent Labs  Lab 11/07/20 1401 11/08/20 0210 11/09/20 0500  NA 126* 126* 126*  K 3.8 3.9 3.8  CL 96* 97* 98  CO2 25 26 22  GLUCOSE 92 88 96  BUN 6 6 7   CREATININE 0.47* 0.48* 0.37*  CALCIUM 8.3* 7.9* 8.3*     GFR: Estimated Creatinine Clearance: 110.5 mL/min (A) (by C-G formula based on SCr of 0.37 mg/dL (L)).    Anemia Panel: Recent Labs    11/08/20 0221  VITAMINB12 990*  FOLATE 6.5  FERRITIN 832*  TIBC 182*  IRON 47  RETICCTPCT 12.3*     Recent Results (from the past 240 hour(s))  Blood culture (routine x 2)     Status: None (Preliminary result)   Collection Time: 11/07/20  2:01 PM   Specimen: BLOOD  Result Value Ref Range Status   Specimen Description   Final    BLOOD LEFT ANTECUBITAL Performed at [facility], [address] [place], [place]    Special Requests   Final    BOTTLES DRAWN AEROBIC AND ANAEROBIC Blood Culture adequate volume Performed at [facility], [address], [place], [place]    Culture   Final    NO GROWTH 3 DAYS Performed at [facility], [address], [place], [place]    Report Status PENDING  Incomplete  Blood culture (routine x 2)     Status: None (Preliminary result)   Collection Time: 11/07/20 11:35 PM   Specimen: BLOOD  Result Value Ref Range Status   Specimen Description   Final    BLOOD LEFT ANTECUBITAL Performed at [facility], [address] [place], [place]     Special Requests   Final    BOTTLES DRAWN AEROBIC AND ANAEROBIC Blood Culture adequate volume Performed at [facility], [address] [place], [place]    Culture   Final    NO GROWTH 2 DAYS Performed at [facility], [address], [place], [place]    Report Status PENDING  Incomplete  Resp Panel by RT-PCR (Flu A&B, Covid) Nasopharyngeal Swab     Status: None   Collection Time: 11/07/20 11:36 PM   Specimen: Nasopharyngeal Swab; Nasopharyngeal(NP) swabs in vial transport medium  Result Value Ref Range Status   SARS Coronavirus 2 by RT PCR NEGATIVE NEGATIVE Final    Comment: (NOTE) SARS-CoV-2 target nucleic acids are NOT DETECTED.  The SARS-CoV-2 RNA is generally detectable in upper respiratory specimens during the acute phase of infection. The lowest concentration of SARS-CoV-2 viral copies this assay can detect is 138 copies/mL. A negative result does not preclude SARS-Cov-2 infection and should not be used as the sole basis for treatment or other patient management decisions. A negative result may occur with  improper specimen collection/handling, submission of specimen other than nasopharyngeal swab, presence of viral mutation(s) within the areas targeted by this assay, and inadequate number of viral copies(<138 copies/mL). A negative result must be combined with clinical observations, patient history, and epidemiological information. The expected result is Negative.  Fact Sheet for Patients:  11/09/20  Fact Sheet for Healthcare Providers:  BloggerCourse.com  This test is no t yet approved or cleared by the [place] FDA and  has been authorized for detection and/or diagnosis of SARS-CoV-2 by FDA under an Emergency Use Authorization (EUA). This EUA will remain  in effect (meaning this test can be used) for the duration of the COVID-19 declaration under Section  564(b)(1) of the Act, 21 U.S.C.section 360bbb-3(b)(1), unless the authorization is terminated  or revoked sooner.       Influenza  A by PCR NEGATIVE NEGATIVE Final   Influenza B by PCR NEGATIVE NEGATIVE Final    Comment: (NOTE) The Xpert Xpress SARS-CoV-2/FLU/RSV plus assay is intended as an aid in the diagnosis of influenza from Nasopharyngeal swab specimens and should not be used as a sole basis for treatment. Nasal washings and aspirates are unacceptable for Xpert Xpress SARS-CoV-2/FLU/RSV testing.  Fact Sheet for Patients: BloggerCourse.comhttps://www.fda.gov/media/152166/download  Fact Sheet for Healthcare Providers: SeriousBroker.ithttps://www.fda.gov/media/152162/download  This test is not yet approved or cleared by the [place] FDA and has been authorized for detection and/or diagnosis of SARS-CoV-2 by FDA under an Emergency Use Authorization (EUA). This EUA will remain in effect (meaning this test can be used) for the duration of the COVID-19 declaration under Section 564(b)(1) of the Act, 21 U.S.C. section 360bbb-3(b)(1), unless the authorization is terminated or revoked.  Performed at [facility], [address] [place], [place] [address]   Cath Tip Culture     Status: None (Preliminary result)   Collection Time: 11/08/20  3:00 AM   Specimen: Catheter Tip  Result Value Ref Range Status   Specimen Description CATH TIP  Final   Special Requests NONE  Final   Culture   Final    NO GROWTH 2 DAYS Performed at [facility], [address] [place], [place] [address]    Report Status PENDING  Incomplete       Radiology Studies: No results found.     LOS: 2 days   Kyle Pitts Kyle EhrlichKrishnan  [facility] Pager on [email or web address]  11/10/2020, 10:33 AM

## 2020-11-10 NOTE — Progress Notes (Signed)
 Subjective:  Kyle Pitts is a 59 y.o. male,   presenting to the ER for a PICC line infection .  Patient was seen by us on 10/09/2020 for a  right knee arthroscopy for septic joint as well as a right lateral partial fasciotomy.  We have followed him in our office  and since then we have been doing weekly wound checks.  He was noted to have significant eschar and hematoma at his fasciotomy site previously.  Also had persistent drainage at his lateral portal.  He has been on ceftriaxone for treatment of his septic joint which was scheduled to end on 11/20/2020.  He has had great improvement in his erythema as well as his edema that he previously had secondary to to his right knee infection.      Today ,Patient reports pain mild. In good spirits. Denies numbness or tingling. Denies fever or chills. Reports swelling of the knee. Reports did well with hydrotherapy.  Objective:   VITALS:   Vitals:   11/09/20 1802 11/09/20 2111 11/10/20 0523 11/10/20 1311  BP: (!) 145/83 (!) 145/78 (!) 150/84 (!) 141/79  Pulse: 86 92 82 95  Resp: 18 18 17 18  Temp: 98 F (36.7 C) 99.1 F (37.3 C) 98.5 F (36.9 C) 98.2 F (36.8 C)  TempSrc: Oral Oral Oral Oral  SpO2: 100% 100% 100% 99%  Weight:      Height:       Constitutional: General Appearance: healthy-appearing, well-nourished, and well-developed. Level of Distress: no acute distress. Eyes: Lens (normal) clear: both eyes. Head: Head: normocephalic and atraumatic. Lungs: Respiratory effort: no dyspnea. Skin: Inspection and palpation: no rash, lesions Neurologic: Cranial Nerves: grossly intact. Sensation: grossly intact. Psychiatric: Insight: good judgement and insight. Mental Status: normal mood and affect and active and alert. Orientation: to time, place, and person.  Right Knee  Inspection: Moderate effusion, no erythema healed anterior portal incisions.  Eschar at lateral portal incision.  Lateral fasciotomy incision with dehiscence and  exposed tissue below. Necrotic eschar at edges.  Minimal erythema at the edges of wound. .  Mild edema at the ankle and lateral thigh. Palpation: No significant tenderness to the knee. ROM 15  to 80    intact extensor mech No calf swelling or tenderness  Distal ankle and foot strength normal Sensation is intact to light touch Extremity is warm and well perfuse           Lab Results  Component Value Date   WBC 7.7 11/10/2020   HGB 8.5 (L) 11/10/2020   HCT 25.9 (L) 11/10/2020   MCV 112.1 (H) 11/10/2020   PLT 220 11/10/2020   BMET    Component Value Date/Time   NA 126 (L) 11/09/2020 0500   K 3.8 11/09/2020 0500   CL 98 11/09/2020 0500   CO2 22 11/09/2020 0500   GLUCOSE 96 11/09/2020 0500   BUN 7 11/09/2020 0500   CREATININE 0.37 (L) 11/09/2020 0500   CALCIUM 8.3 (L) 11/09/2020 0500   GFRNONAA >60 11/09/2020 0500   GFRAA  05/13/2010 1718    >60        The eGFR has been calculated using the MDRD equation. This calculation has not been validated in all clinical situations. eGFR's persistently <60 mL/min signify possible Chronic Kidney Disease.     Assessment/Plan:     Principal Problem:   PICC line infection Active Problems:   Septic arthritis of knee (HCC)   Hyponatremia   Anemia   Up with   therapy  Wet to dry dressing replaced. Continue hydrotherapy while inpatient. Continue wet to dry dressings. Will discuss with Dr. Rogers consideration of plastic surgery consult before discharge.   recommend nutritional supplements such as Ensure or nutrition consult.  Weightbearing Status: Weightbearing and range of motion as tolerated Antibiotics - per infectious disease.  DVT Prophylaxis: Per primary team  Follow up in our office in one week from discharge.   Will follow along, message for questions.      D  11/10/2020, 5:30 PM    PA-C  Physician Assistant with Dr. Rogers, [facility]  

## 2020-11-10 NOTE — Progress Notes (Signed)
    [facility]   Reason for visit: Follow up on picc line superficial infection  Interval History: no fever, WBC remains wnl.    Physical Exam: Constitutional:  Vitals:   11/09/20 2111 11/10/20 0523  BP: (!) 145/78 (!) 150/84  Pulse: 92 82  Resp: 18 17  Temp: 99.1 F (37.3 C) 98.5 F (36.9 C)  SpO2: 100% 100%   patient appears in NAD  Impression: picc associated infection at insertion site, now on high dose amoxicillin  Plan: 1. Continue amoxicillin 2.  Has follow up with Dr. Daiva Eves 8/4  I will sign off, call with questions

## 2020-11-11 LAB — CATH TIP CULTURE: Culture: NO GROWTH

## 2020-11-11 LAB — BASIC METABOLIC PANEL
Anion gap: 7 (ref 5–15)
BUN: 10 mg/dL (ref 6–20)
CO2: 24 mmol/L (ref 22–32)
Calcium: 8.7 mg/dL — ABNORMAL LOW (ref 8.9–10.3)
Chloride: 95 mmol/L — ABNORMAL LOW (ref 98–111)
Creatinine, Ser: 0.53 mg/dL — ABNORMAL LOW (ref 0.61–1.24)
GFR, Estimated: >60 mL/min (ref >60)
Glucose, Bld: 82 mg/dL (ref 70–99)
Potassium: 4 mmol/L (ref 3.5–5.1)
Sodium: 126 mmol/L — ABNORMAL LOW (ref 135–145)

## 2020-11-11 LAB — CBC
HCT: 28.7 % — ABNORMAL LOW (ref 39.0–52.0)
Hemoglobin: 9.2 g/dL — ABNORMAL LOW (ref 13.0–17.0)
MCH: 35.9 pg — ABNORMAL HIGH (ref 26.0–34.0)
MCHC: 32.1 g/dL (ref 30.0–36.0)
MCV: 112.1 fL — ABNORMAL HIGH (ref 80.0–100.0)
Platelets: 256 10*3/uL (ref 150–400)
RBC: 2.56 MIL/uL — ABNORMAL LOW (ref 4.22–5.81)
RDW: 23.9 % — ABNORMAL HIGH (ref 11.5–15.5)
WBC: 7.9 10*3/uL (ref 4.0–10.5)
nRBC: 0 % (ref 0.0–0.2)

## 2020-11-11 MED ORDER — AMOXICILLIN 500 MG PO CAPS: 1000.0000 mg | ORAL_CAPSULE | Freq: Three times a day (TID) | ORAL | 0 refills | Status: DC

## 2020-11-11 MED ORDER — BACITRACIN-NEOMYCIN-POLYMYXIN 400-5-5000 EX OINT: TOPICAL_OINTMENT | Freq: Three times a day (TID) | CUTANEOUS | 0 refills | Status: DC

## 2020-11-11 MED ORDER — OXYCODONE HCL 5 MG PO TABS: 5.0000 mg | ORAL_TABLET | Freq: Four times a day (QID) | ORAL | 0 refills | Status: DC | PRN

## 2020-11-11 NOTE — Progress Notes (Signed)
Patient was given discharge instructions, and all questions were answered.  Patient was stable for discharge and was taken to the main exit by wheelchair. 

## 2020-11-11 NOTE — Progress Notes (Signed)
Physical Therapy Wound Treatment Patient Details  Name: Kyle Pitts MRN: [id number] Date of Birth: 09-17-1960  Today's Date: 11/11/2020 Time: 7035-0093 Time Calculation (min): 31 min  Subjective  Subjective Assessment Subjective: Hey! Patient and Family Stated Goals: wound healed and get back to work Date of Onset: 11/11/20 (present on admission) Prior Treatments: fasciotomy 10/09/20  Pain Score:    0/10 Debrided most of residual eschar from wound border. Educated pt on dressing changes.   Wound Assessment  Wound / Incision (Open or Dehisced) 11/09/20 Incision - Open Pretibial Proximal;Right;Lateral;Upper PT ONLY (Active)  Dressing Type ABD;Moist to dry;Gauze (Comment) 11/11/20 1300  Dressing Changed Changed 11/09/20 1400  Dressing Status Clean;Dry;Intact 11/10/20 1952  Dressing Change Frequency Twice a day 11/10/20 8182  Site / Wound Assessment Granulation tissue;Yellow 11/11/20 1300  % Wound base Red or Granulating 10% 11/11/20 1300  % Wound base Yellow/Fibrinous Exudate 80% 11/11/20 1300  % Wound base Black/Eschar 10% 11/09/20 1400  % Wound base Other/Granulation Tissue (Comment) 10% 11/11/20 1300  Peri-wound Assessment Erythema (blanchable);Intact;Edema 11/09/20 1400  Wound Length (cm) 10.5 cm 11/09/20 1400  Wound Width (cm) 5 cm 11/09/20 1400  Wound Depth (cm) 0.3 cm 11/09/20 1400  Wound Volume (cm^3) 15.75 cm^3 11/09/20 1400  Wound Surface Area (cm^2) 52.5 cm^2 11/09/20 1400  Margins Unattached edges (unapproximated) 11/11/20 1300  Closure None 11/09/20 1400  Drainage Amount Minimal 11/11/20 1300  Drainage Description Serosanguineous 11/11/20 1300  Treatment Packing (Saline gauze);Debridement (Selective);Hydrotherapy (Pulse lavage);Cleansed 11/11/20 1300     Incision (Closed) 09/27/20 Knee Right (Active)  Dressing Type None 11/10/20 1952  Dressing Clean;Dry;Intact 11/10/20 9937  Site / Wound Assessment Dressing in place / Unable to assess 10/12/20 2034  Margins  Attached edges (approximated) 10/12/20 2034  Drainage Amount None 11/10/20 0811     Incision (Closed) 10/09/20 Leg Right (Active)  Dressing Type Gauze (Comment) 11/11/20 0859  Dressing Clean;Dry;Intact 11/11/20 0859  Site / Wound Assessment Dressing in place / Unable to assess 11/11/20 0859  Drainage Amount None 11/10/20 1952   Hydrotherapy Pulsed lavage therapy - wound location: R anterior-lateral lower leg Pulsed Lavage with Suction (psi): 8 psi Pulsed Lavage with Suction - Normal Saline Used: 1000 mL Pulsed Lavage Tip: Tip with splash shield Selective Debridement Selective Debridement - Location: same as wound location Selective Debridement - Tools Used: Forceps, Scissors Selective Debridement - Tissue Removed: coagulated blood,  black eschar    Wound Assessment and Plan  Wound Therapy - Assess/Plan/Recommendations Wound Therapy - Clinical Statement: 60 y.o. male adm with PICC line infection, RLE wound/open incision s/p recent fasciotoy.PMH: cirrhosis, hyponatremia, obesity, ETOH, R TKA/multiple knee surgeries, R LE fasciotomy anterior compartment on 10/09/20. Wound Therapy - Functional Problem List: decr mobility/strength/ROM  RLE Factors Delaying/Impairing Wound Healing: Infection - systemic/local, Substance abuse, Multiple medical problems Hydrotherapy Plan: Debridement, Dressing change, Patient/family education, Pulsatile lavage with suction Wound Therapy - Frequency: 6X / week Wound Therapy - Follow Up Recommendations: dressing changes by RN, dressing changes by family/patient  Wound Therapy Goals- Improve the function of patient's integumentary system by progressing the wound(s) through the phases of wound healing (inflammation - proliferation - remodeling) by: Wound Therapy Goals - Improve the function of patient's integumentary system by progressing the wound(s) through the phases of wound healing by: Decrease Necrotic Tissue to: 70 Decrease Necrotic Tissue - Progress:  Progressing toward goal Increase Granulation Tissue to: 30 Increase Granulation Tissue - Progress: Progressing toward goal Improve Drainage Characteristics: Min Improve Drainage Characteristics - Progress: Progressing toward goal Patient/Family  will be able to : verbalize dressing changes Patient/Family Instruction Goal - Progress: Progressing toward goal Goals/treatment plan/discharge plan were made with and agreed upon by patient/family: Yes Time For Goal Achievement: 2 weeks Wound Therapy - Potential for Goals: Good  Goals will be updated until maximal potential achieved or discharge criteria met.  Discharge criteria: when goals achieved, discharge from hospital, MD decision/surgical intervention, no progress towards goals, refusal/missing three consecutive treatments without notification or medical reason.  Baxter Flattery, PT  Acute Rehab Dept [facility]) [phone] Pager [phone]  11/11/2020      Charges PT Wound Care Charges $Wound Debridement up to 20 cm: < or equal to 20 cm $PT Hydrotherapy Dressing: 1 dressing $PT PLS Gun and Tip: 1 Supply $PT Hydrotherapy Visit: 1 Visit       [facility] 11/11/2020, 1:35 PM

## 2020-11-11 NOTE — Discharge Summary (Signed)
[facility]  Physician Discharge Summary   Patient ID: SUPREME RYBARCZYK MRN: [id number] DOB/AGE: January 15, 1961 60 y.o.  Admit date: 11/07/2020 Discharge date: 11/11/2020    PCP: [name], MD  DISCHARGE DIAGNOSES:  Infected PICC line status post removal Septic arthritis right knee due to strep pyogenes Hyponatremia, stable Questionable history of liver cirrhosis History of alcohol dependence Macrocytic anemia  RECOMMENDATIONS FOR OUTPATIENT FOLLOW UP: Outpatient follow-up with infectious disease and orthopedics    Home Health: None Equipment/Devices: None  CODE STATUS: Full code  DISCHARGE CONDITION: fair  Diet recommendation: As before  INITIAL HISTORY: 60 y.o. male with medical history significant of possible cirrhosis, hyponatremia.  Recently hospitalized in June for septic arthritis of the right knee with a streptococcal infection.  Seen by infectious disease.  Underwent irrigation of the joint by orthopedics.  PICC line was placed and the patient was discharged with plans for 6 weeks of IV antibiotics in the form of ceftriaxone.  Over the last few days the home health nurse has noticed some redness around the PICC joint and then noticed purulent discharge and so she has the patient to go to the emergency department.  Patient was subsequently hospitalized   Consultants: orthopedics and infectious disease   Procedures: PICC line removal.  Blood transfusion.  Hydrotherapy for wound    HOSPITAL COURSE:   PICC line infection No evidence for sepsis.  PICC line was removed.  Patient seen by infectious disease.  Cultures negative so far.  Antibiotics changed back from vancomycin and cefepime to ceftriaxone.  Subsequently changed over to amoxicillin. Yellowish exudate in the PICC line site.  Bacitracin ointment ordered.   Septic arthritis right knee due to strep pyogenes Seen by infectious disease during previous hospitalization and was placed on ceftriaxone to  be given for 6 weeks.  Followed by Dr. [name] with orthopedics.  Was supposed to see orthopedics in their office on 7/22.  Orthopedics was consulted and they saw the patient on 7/23.  Hydrotherapy was ordered by them.  Received 2 sessions of hydrotherapy.  Cleared by orthopedics for discharge.  They will follow-up in their office.   Patient is also seen by infectious disease.  Considering that there was no evidence of active infection on examination of the right leg patient was changed over to oral amoxicillin per ID recommendation.  This will need to continue to the original end date which was 11/20/2020.     Hyponatremia Appears to be chronic for the most part.  Could be due to questionable history of liver cirrhosis.     Questionable history of liver cirrhosis/alcohol dependence Drinks anywhere between 3-4 beers on a daily basis.  Occasional vodka use. Patient counseled regarding his alcohol intake.    Macrocytic anemia Drop in hemoglobin to 6.9 noted.  No overt blood loss is appreciated.  He was transfused 1 unit of blood with appropriate response in hemoglobin.  No overt bleeding noted.   Anemia panel reviewed.  No clear-cut deficiencies identified.     Overall stable.  Okay for discharge home today.   PERTINENT LABS:  The results of significant diagnostics from this hospitalization (including imaging, microbiology, ancillary and laboratory) are listed below for reference.    Microbiology: Recent Results (from the past 240 hour(s))  Blood culture (routine x 2)     Status: None   Collection Time: 11/07/20  2:01 PM   Specimen: BLOOD  Result Value Ref Range Status   Specimen Description   Final  BLOOD LEFT ANTECUBITAL Performed at [facility], [address] [place], [place] [address]    Special Requests   Final    BOTTLES DRAWN AEROBIC AND ANAEROBIC Blood Culture adequate volume Performed at [facility], [address], [place], [place]  [address]    Culture   Final    NO GROWTH 5 DAYS Performed at [facility], [address], [place], [place] [address]    Report Status 11/12/2020 FINAL  Final  Blood culture (routine x 2)     Status: None (Preliminary result)   Collection Time: 11/07/20 11:35 PM   Specimen: BLOOD  Result Value Ref Range Status   Specimen Description   Final    BLOOD LEFT ANTECUBITAL Performed at [facility], [address], [place], [place] [address]    Special Requests   Final    BOTTLES DRAWN AEROBIC AND ANAEROBIC Blood Culture adequate volume Performed at [facility], [address], [place], [place] [address]    Culture   Final    NO GROWTH 4 DAYS Performed at [facility], [address], [place], [place] [address]    Report Status PENDING  Incomplete  Resp Panel by RT-PCR (Flu A&B, Covid) Nasopharyngeal Swab     Status: None   Collection Time: 11/07/20 11:36 PM   Specimen: Nasopharyngeal Swab; Nasopharyngeal(NP) swabs in vial transport medium  Result Value Ref Range Status   SARS Coronavirus 2 by RT PCR NEGATIVE NEGATIVE Final    Comment: (NOTE) SARS-CoV-2 target nucleic acids are NOT DETECTED.  The SARS-CoV-2 RNA is generally detectable in upper respiratory specimens during the acute phase of infection. The lowest concentration of SARS-CoV-2 viral copies this assay can detect is 138 copies/mL. A negative result does not preclude SARS-Cov-2 infection and should not be used as the sole basis for treatment or other patient management decisions. A negative result may occur with  improper specimen collection/handling, submission of specimen other than nasopharyngeal swab, presence of viral mutation(s) within the areas targeted by this assay, and inadequate number of viral copies(<138 copies/mL). A negative result must be combined with clinical observations, patient history, and epidemiological information. The expected result is  Negative.  Fact Sheet for Patients:  BloggerCourse.com  Fact Sheet for Healthcare Providers:  SeriousBroker.it  This test is no t yet approved or cleared by the [place] FDA and  has been authorized for detection and/or diagnosis of SARS-CoV-2 by FDA under an Emergency Use Authorization (EUA). This EUA will remain  in effect (meaning this test can be used) for the duration of the COVID-19 declaration under Section 564(b)(1) of the Act, 21 U.S.C.section 360bbb-3(b)(1), unless the authorization is terminated  or revoked sooner.       Influenza A by PCR NEGATIVE NEGATIVE Final   Influenza B by PCR NEGATIVE NEGATIVE Final    Comment: (NOTE) The Xpert Xpress SARS-CoV-2/FLU/RSV plus assay is intended as an aid in the diagnosis of influenza from Nasopharyngeal swab specimens and should not be used as a sole basis for treatment. Nasal washings and aspirates are unacceptable for Xpert Xpress SARS-CoV-2/FLU/RSV testing.  Fact Sheet for Patients: BloggerCourse.com  Fact Sheet for Healthcare Providers: SeriousBroker.it  This test is not yet approved or cleared by the [place] FDA and has been authorized for detection and/or diagnosis of SARS-CoV-2 by FDA under an Emergency Use Authorization (EUA). This EUA will remain in effect (meaning this test can be used) for the duration  of the COVID-19 declaration under Section 564(b)(1) of the Act, 21 U.S.C. section 360bbb-3(b)(1), unless the authorization is terminated or revoked.  Performed at [facility], [address] [place], [place] [address]   Cath Tip Culture     Status: None   Collection Time: 11/08/20  3:00 AM   Specimen: Catheter Tip  Result Value Ref Range Status   Specimen Description CATH TIP  Final   Special Requests NONE  Final   Culture   Final    NO GROWTH Performed at [facility], [address] [place], [place] [address]    Report Status 11/11/2020 FINAL  Final     Labs:  COVID-19 Labs   Lab Results  Component Value Date   SARSCOV2NAA NEGATIVE 11/07/2020   SARSCOV2NAA NEGATIVE 10/07/2020   SARSCOV2NAA NEGATIVE 09/27/2020      Basic Metabolic Panel: Recent Labs  Lab 11/07/20 1401 11/08/20 0210 11/09/20 0500 11/11/20 0400  NA 126* 126* 126* 126*  K 3.8 3.9 3.8 4.0  CL 96* 97* 98 95*  CO2 25 26 22 24   GLUCOSE 92 88 96 82  BUN 6 6 7 10   CREATININE 0.47* 0.48* 0.37* 0.53*  CALCIUM 8.3* 7.9* 8.3* 8.7*    CBC: Recent Labs  Lab 11/07/20 1401 11/08/20 0210 11/08/20 1329 11/09/20 0500 11/10/20 0459 11/11/20 0400  WBC 6.0 7.8  --  8.4 7.7 7.9  NEUTROABS 3.2  --   --   --   --   --   HGB 7.4* 6.9* 8.4* 8.1* 8.5* 9.2*  HCT 22.4* 21.4* 24.6* 24.3* 25.9* 28.7*  MCV 110.9* 113.2*  --  110.5* 112.1* 112.1*  PLT 232 212  --  225 220 256      IMAGING STUDIES No results found.  DISCHARGE EXAMINATION: Vitals:   11/10/20 1311 11/10/20 2038 11/11/20 0438 11/11/20 1331  BP: (!) 141/79 135/87 (!) 156/90 126/72  Pulse: 95 87 78 79  Resp: 18 20 18 12   Temp: 98.2 F (36.8 C) 98.3 F (36.8 C) (!) 97.4 F (36.3 C) 98.6 F (37 C)  TempSrc: Oral Oral Oral Oral  SpO2: 99% 100% 100% 98%  Weight:      Height:       General appearance: Awake alert.  In no distress Resp: Clear to auscultation bilaterally.  Normal effort Cardio: S1-S2 is normal regular.  No S3-S4.  No rubs murmurs or bruit GI: Abdomen is soft.  Nontender nondistended.  Bowel sounds are present normal.  No masses organomegaly    DISPOSITION: Home  Discharge Instructions     Call MD for:  difficulty breathing, headache or visual disturbances   Complete by: As directed    Call MD for:  extreme fatigue   Complete by: As directed    Call MD for:  persistant dizziness or light-headedness   Complete by: As directed    Call MD for:  persistant nausea and vomiting   Complete by:  As directed    Call MD for:  severe uncontrolled pain   Complete by: As directed    Call MD for:  temperature >100.4   Complete by: As directed    Diet - low sodium heart healthy   Complete by: As directed    Discharge instructions   Complete by: As directed    Take your medications as prescribed.  Seek attention if your symptoms worsen or if you develop fever chills and note worsening redness in your right leg.  You were  cared for by a hospitalist during your hospital stay. If you have any questions about your discharge medications or the care you received while you were in the hospital after you are discharged, you can call the unit and asked to speak with the hospitalist on call if the hospitalist that took care of you is not available. Once you are discharged, your primary care physician will handle any further medical issues. Please note that NO REFILLS for any discharge medications will be authorized once you are discharged, as it is imperative that you return to your primary care physician (or establish a relationship with a primary care physician if you do not have one) for your aftercare needs so that they can reassess your need for medications and monitor your lab values. If you do not have a primary care physician, you can call [phone] for a physician referral.   Discharge wound care:   Complete by: As directed    As previously instructed by orthopedics.   Increase activity slowly   Complete by: As directed          Allergies as of 11/11/2020       Reactions   Cephalexin Nausea Only   Upset stomach        Medication List     STOP taking these medications    cefTRIAXone  IVPB Commonly known as: ROCEPHIN   celecoxib 100 MG capsule Commonly known as: CELEBREX   HYDROcodone-acetaminophen 5-325 MG tablet Commonly known as: NORCO/VICODIN       TAKE these medications    acetaminophen 325 MG tablet Commonly known as: TYLENOL Take 1-2 tablets (325-650 mg total)  by mouth every 6 (six) hours as needed for mild pain (pain score 1-3 or temp > 100.5).   amoxicillin 500 MG capsule Commonly known as: AMOXIL Take 2 capsules (1,000 mg total) by mouth every 8 (eight) hours for 10 days.   aspirin EC 81 MG tablet Take 1 tablet (81 mg total) by mouth 2 (two) times daily. Swallow whole. What changed: when to take this   ibuprofen 200 MG tablet Commonly known as: ADVIL Take 600 mg by mouth every 6 (six) hours as needed for fever, headache or mild pain.   neomycin-bacitracin-polymyxin ointment Commonly known as: NEOSPORIN Apply topically 3 (three) times daily. To wound on right upper arm   oxyCODONE 5 MG immediate release tablet Commonly known as: Oxy IR/ROXICODONE Take 1 tablet (5 mg total) by mouth every 6 (six) hours as needed for moderate pain.               Discharge Care Instructions  (From admission, onward)           Start     Ordered   11/11/20 0000  Discharge wound care:       Comments: As previously instructed by orthopedics.   11/11/20 0941              Follow-up Information     Arbie Cookey, PA. Call in 1 week(s).   Why: Call for wound check appt. Contact information: [address] [place] [address] [phone]                 TOTAL DISCHARGE TIME: 35 minutes  Lorenia Hoston Rito Ehrlich  [facility] Pager on [email or web address]  11/12/2020, 12:07 PM

## 2020-11-12 LAB — CULTURE, BLOOD (ROUTINE X 2)
Culture: NO GROWTH
Special Requests: ADEQUATE

## 2020-11-13 LAB — CULTURE, BLOOD (ROUTINE X 2)
Culture: NO GROWTH
Special Requests: ADEQUATE

## 2020-11-21 ENCOUNTER — Ambulatory Visit (INDEPENDENT_AMBULATORY_CARE_PROVIDER_SITE_OTHER): Payer: Self-pay | Admitting: Infectious Disease

## 2020-11-21 ENCOUNTER — Encounter: Payer: Self-pay | Admitting: Infectious Disease

## 2020-11-21 ENCOUNTER — Ambulatory Visit: Payer: Self-pay | Admitting: Infectious Disease

## 2020-11-21 ENCOUNTER — Other Ambulatory Visit: Payer: Self-pay

## 2020-11-21 VITALS — BP 149/78 | HR 82 | Wt 217.0 lb

## 2020-11-21 DIAGNOSIS — M869 Osteomyelitis, unspecified: Secondary | ICD-10-CM

## 2020-11-21 DIAGNOSIS — T7840XD Allergy, unspecified, subsequent encounter: Secondary | ICD-10-CM

## 2020-11-21 DIAGNOSIS — L03115 Cellulitis of right lower limb: Secondary | ICD-10-CM

## 2020-11-21 DIAGNOSIS — L02415 Cutaneous abscess of right lower limb: Secondary | ICD-10-CM

## 2020-11-21 DIAGNOSIS — M00261 Other streptococcal arthritis, right knee: Secondary | ICD-10-CM

## 2020-11-21 DIAGNOSIS — M609 Myositis, unspecified: Secondary | ICD-10-CM

## 2020-11-21 DIAGNOSIS — T80219D Unspecified infection due to central venous catheter, subsequent encounter: Secondary | ICD-10-CM

## 2020-11-21 DIAGNOSIS — B950 Streptococcus, group A, as the cause of diseases classified elsewhere: Secondary | ICD-10-CM

## 2020-11-21 DIAGNOSIS — T7840XA Allergy, unspecified, initial encounter: Secondary | ICD-10-CM

## 2020-11-21 HISTORY — DX: Myositis, unspecified: M60.9

## 2020-11-21 HISTORY — DX: Allergy, unspecified, initial encounter: T78.40XA

## 2020-11-21 HISTORY — DX: Osteomyelitis, unspecified: M86.9

## 2020-11-21 HISTORY — DX: Streptococcus, group A, as the cause of diseases classified elsewhere: B95.0

## 2020-11-21 MED ORDER — CEFADROXIL 500 MG PO CAPS: 1000.0000 mg | ORAL_CAPSULE | Freq: Two times a day (BID) | ORAL | 2 refills | Status: AC

## 2020-11-21 NOTE — Progress Notes (Signed)
Subjective:   Chief complaint follow-up for septic knee myofascia-itis with wound still present, also with recent apparent allergic reaction to amoxicillin   Patient ID: Kyle Pitts, male    DOB: March 30, 1961, 60 y.o.   MRN: [id number]  HPI  60 year old Caucasian man who developed Streptococcus pyogenes's native septic arthritis and was status post I&D of his knee on June 10.  He was initially on broad-spectrum antibiotics then narrowed to ceftriaxone and discharged on this.  He then came back to the ER with worsening swelling in his right lower extremity as well as his thigh.  MRI was performed and I have personally reviewed it at this visit.  MRI does indeed show an extensive amount of soft tissue swelling as well as large joint effusion and mild fasciitis in the thigh and the anterior compartment in the lower leg with a 1.5 x 0.6 abscess.  In addition to this there is concerning abnormalities in the marrow signal in the medial femoral condyle tibial plateau concerning for possible osteomyelitis.  He was then taken back to the operating room on the 22nd with arthroscopic I&D synovectomy anterior and lateral compartment fasciotomy along with lower leg right leg irrigation and debridement by Dr. [name].  He was then on course to complete 6 additional weeks of postoperative antibiotics when he was admitted the hospital with a PICC line infection.  PICC line was removed and he was eventually changed over to amoxicillin  The patient had carried a history of penicillin allergy but had had an amoxicillin challenge in the hospital without any adverse effects.  However this past weekend when he was taking amoxicillin on Sunday he developed intense itching around his eyes and on his scalp which only stopped after he stopped taking the antibiotic.  He continues to have some pain in his knee but not nearly what he had when his septic arthritis was raging.  He still continues to have an open wound on  his lower leg which when we remove the bandage bled copiously.    Past Medical History:  Diagnosis Date   Cirrhosis (HCC)    Hyponatremia    Septic arthritis [facility])     Past Surgical History:  Procedure Laterality Date   I & D EXTREMITY Right 10/09/2020   Procedure: IRRIGATION AND DEBRIDEMENT EXTREMITY;  Surgeon: Yolonda Kida, MD;  Location: [facility];  Service: Orthopedics;  Laterality: Right;   KNEE ARTHROSCOPY Right 09/27/2020   Procedure: ARTHROSCOPY KNEE, WASH OUT;  Surgeon: Venita Lick, MD;  Location: [facility];  Service: Orthopedics;  Laterality: Right;   KNEE ARTHROSCOPY Right 10/09/2020   Procedure: ARTHROSCOPY KNEE,ARTHROSCOPIC I & D, CALF ABSCESS;  Surgeon: Yolonda Kida, MD;  Location: [facility];  Service: Orthopedics;  Laterality: Right;    Family History  Problem Relation Age of Onset   Hepatitis Mother    Cirrhosis Mother    Stroke Father    Heart failure Father       Social History   Socioeconomic History   Marital status: Divorced    Spouse name: Not on file   Number of children: Not on file   Years of education: Not on file   Highest education level: Not on file  Occupational History   Not on file  Tobacco Use   Smoking status: Never   Smokeless tobacco: Never  Vaping Use   Vaping Use: Never used  Substance and Sexual Activity   Alcohol use: Yes    Alcohol/week: 24.0  standard drinks    Types: 24 Cans of beer per week    Comment: 3-4 when home from work   Drug use: Not Currently    Types: Cocaine, Marijuana    Comment: last used 1 month ago   Sexual activity: Not Currently  Other Topics Concern   Not on file  Social History Narrative   Not on file   Social Determinants of Health   Financial Resource Strain: Not on file  Food Insecurity: Not on file  Transportation Needs: Not on file  Physical Activity: Not on file  Stress: Not on file  Social Connections: Not on file    Allergies  Allergen Reactions   Cephalexin Nausea  Only    Upset stomach     Current Outpatient Medications:    acetaminophen (TYLENOL) 325 MG tablet, Take 1-2 tablets (325-650 mg total) by mouth every 6 (six) hours as needed for mild pain (pain score 1-3 or temp > 100.5)., Disp: , Rfl:    amoxicillin (AMOXIL) 500 MG capsule, Take 2 capsules (1,000 mg total) by mouth every 8 (eight) hours for 10 days. (Patient not taking: Reported on 11/21/2020), Disp: 60 capsule, Rfl: 0   ibuprofen (ADVIL) 200 MG tablet, Take 600 mg by mouth every 6 (six) hours as needed for fever, headache or mild pain., Disp: , Rfl:    neomycin-bacitracin-polymyxin (NEOSPORIN) ointment, Apply topically 3 (three) times daily. To wound on right upper arm, Disp: 15 g, Rfl: 0   oxyCODONE (OXY IR/ROXICODONE) 5 MG immediate release tablet, Take 1 tablet (5 mg total) by mouth every 6 (six) hours as needed for moderate pain., Disp: 20 tablet, Rfl: 0   Review of Systems  Constitutional:  Negative for chills and fever.  HENT:  Negative for congestion, drooling, ear pain, facial swelling, mouth sores, nosebleeds, rhinorrhea, sneezing and sore throat.   Eyes:  Negative for photophobia.  Respiratory:  Negative for cough, shortness of breath and wheezing.   Cardiovascular:  Negative for chest pain, palpitations and leg swelling.  Gastrointestinal:  Negative for abdominal pain, blood in stool, constipation, diarrhea, nausea and vomiting.  Genitourinary:  Negative for dysuria, flank pain and hematuria.  Musculoskeletal:  Negative for back pain and myalgias.  Skin:  Negative for rash.  Neurological:  Negative for dizziness, weakness and headaches.  Hematological:  Does not bruise/bleed easily.  Psychiatric/Behavioral:  Negative for agitation, confusion, decreased concentration, dysphoric mood, hallucinations, self-injury and suicidal ideas. The patient is not nervous/anxious.       Objective:   Physical Exam Constitutional:      General: He is not in acute distress.    Appearance:  Normal appearance. He is well-developed. He is not ill-appearing or diaphoretic.  HENT:     Head: Normocephalic and atraumatic.     Right Ear: Hearing and external ear normal.     Left Ear: Hearing and external ear normal.     Nose: No nasal deformity or rhinorrhea.  Eyes:     General: No scleral icterus.       Right eye: No discharge.     Extraocular Movements: Extraocular movements intact.     Conjunctiva/sclera: Conjunctivae normal.     Right eye: Right conjunctiva is not injected.     Left eye: Left conjunctiva is not injected.     Pupils: Pupils are equal, round, and reactive to light.  Neck:     Vascular: No JVD.  Cardiovascular:     Rate and Rhythm: Normal rate and regular rhythm.  Heart sounds: S1 normal and S2 normal.  Pulmonary:     Effort: No respiratory distress.     Breath sounds: No wheezing.  Abdominal:     General: There is no distension.     Palpations: Abdomen is soft.  Musculoskeletal:        General: Normal range of motion.     Right shoulder: Normal.     Left shoulder: Normal.     Cervical back: Normal range of motion and neck supple.     Right hip: Normal.     Left hip: Normal.     Right knee: Swelling and effusion present.     Left knee: Normal.  Lymphadenopathy:     Head:     Right side of head: No submandibular, preauricular or posterior auricular adenopathy.     Left side of head: No submandibular, preauricular or posterior auricular adenopathy.     Cervical: No cervical adenopathy.     Right cervical: No superficial or deep cervical adenopathy.    Left cervical: No superficial or deep cervical adenopathy.  Skin:    General: Skin is warm and dry.     Coloration: Skin is not pale.     Findings: No abrasion, bruising, ecchymosis, erythema, lesion or rash.     Nails: There is no clubbing.  Neurological:     General: No focal deficit present.     Mental Status: He is alert and oriented to person, place, and time.     Sensory: No sensory  deficit.     Coordination: Coordination normal.     Gait: Gait normal.  Psychiatric:        Attention and Perception: He is attentive.        Mood and Affect: Mood normal.        Speech: Speech normal.        Behavior: Behavior normal. Behavior is cooperative.        Thought Content: Thought content normal.        Judgment: Judgment normal.     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  I am checking a sed rate C-reactive protein to assess status infection, CBC with differential and BMP for monitoring for medications and kidney function to make sure his medications are dosed appropriately.  I am going to change him over from amoxicillin to cefadroxil 500 to take 2 tablets twice daily which I prescribed and I have him come back and see me in roughly a month's time.  Amoxicillin allergy his reaction this time does not Pizzi appears severe but he also stopped antibiotics fairly promptly after he began having symptoms I have updated the allergy back into the chart.  I spent 42 minutes with the patient including face to face counseling of the patient the nature of septic arthritis myositis and osteomyelitis personally reviewing MRI of this leg from June 20th, along with preview of medical records before and during the visit and in coordination of his care.

## 2020-11-22 ENCOUNTER — Telehealth: Payer: Self-pay

## 2020-11-22 ENCOUNTER — Other Ambulatory Visit: Payer: Self-pay

## 2020-11-22 DIAGNOSIS — T80219D Unspecified infection due to central venous catheter, subsequent encounter: Secondary | ICD-10-CM

## 2020-11-22 LAB — COMPLETE METABOLIC PANEL WITH GFR
AG Ratio: 0.4 (calc) — ABNORMAL LOW (ref 1.0–2.5)
ALT: 11 U/L (ref 9–46)
AST: 24 U/L (ref 10–35)
Albumin: 2.4 g/dL — ABNORMAL LOW (ref 3.6–5.1)
Alkaline phosphatase (APISO): 237 U/L — ABNORMAL HIGH (ref 35–144)
BUN/Creatinine Ratio: 10 (calc) (ref 6–22)
BUN: 5 mg/dL — ABNORMAL LOW (ref 7–25)
CO2: 24 mmol/L (ref 20–32)
Calcium: 7.6 mg/dL — ABNORMAL LOW (ref 8.6–10.3)
Chloride: 94 mmol/L — ABNORMAL LOW (ref 98–110)
Creat: 0.52 mg/dL — ABNORMAL LOW (ref 0.70–1.30)
Globulin: 5.4 g/dL (calc) — ABNORMAL HIGH (ref 1.9–3.7)
Glucose, Bld: 80 mg/dL (ref 65–99)
Potassium: 3.6 mmol/L (ref 3.5–5.3)
Sodium: 123 mmol/L — ABNORMAL LOW (ref 135–146)
Total Bilirubin: 0.7 mg/dL (ref 0.2–1.2)
Total Protein: 7.8 g/dL (ref 6.1–8.1)
eGFR: 117 mL/min/{1.73_m2} (ref >=60)

## 2020-11-22 LAB — CBC WITH DIFFERENTIAL/PLATELET
Absolute Monocytes: 1114 cells/uL — ABNORMAL HIGH (ref 200–950)
Basophils Absolute: 38 cells/uL (ref 0–200)
Basophils Relative: 0.6 %
Eosinophils Absolute: 109 cells/uL (ref 15–500)
Eosinophils Relative: 1.7 %
HCT: 28.9 % — ABNORMAL LOW (ref 38.5–50.0)
Hemoglobin: 9.6 g/dL — ABNORMAL LOW (ref 13.2–17.1)
Lymphs Abs: 2163 cells/uL (ref 850–3900)
MCH: 34.7 pg — ABNORMAL HIGH (ref 27.0–33.0)
MCHC: 33.2 g/dL (ref 32.0–36.0)
MCV: 104.3 fL — ABNORMAL HIGH (ref 80.0–100.0)
MPV: 8 fL (ref 7.5–12.5)
Monocytes Relative: 17.4 %
Neutro Abs: 2976 cells/uL (ref 1500–7800)
Neutrophils Relative %: 46.5 %
Platelets: 285 10*3/uL (ref 140–400)
RBC: 2.77 10*6/uL — ABNORMAL LOW (ref 4.20–5.80)
RDW: 16.9 % — ABNORMAL HIGH (ref 11.0–15.0)
Total Lymphocyte: 33.8 %
WBC: 6.4 10*3/uL (ref 3.8–10.8)

## 2020-11-22 LAB — SEDIMENTATION RATE: Sed Rate: 110 mm/h — ABNORMAL HIGH (ref 0–20)

## 2020-11-22 LAB — C-REACTIVE PROTEIN: CRP: 117.3 mg/L — ABNORMAL HIGH (ref <8.0)

## 2020-11-22 MED ORDER — CEPHALEXIN 500 MG PO CAPS: 500.0000 mg | ORAL_CAPSULE | Freq: Four times a day (QID) | ORAL | 6 refills | Status: AC

## 2020-11-22 MED ORDER — CEPHALEXIN 500 MG PO CAPS: 500.0000 mg | ORAL_CAPSULE | Freq: Four times a day (QID) | ORAL | 0 refills | Status: DC

## 2020-11-22 NOTE — Telephone Encounter (Signed)
Advised results and patient is going to call PCP. Patient reports he can not afford the abx given and is allergic to PCN. Advised I will reach out to Dr. Daiva Eves and see what options there are for the infection. I have sent a message to DR [facility] and will forward to Triage to follow up.

## 2020-11-22 NOTE — Telephone Encounter (Signed)
-----  Message from Truman Hayward, MD sent at 11/22/2020  9:51 AM EDT ----- Patients sodium was 123. Needs followup with PCP, it is not much different than last value but getting very low hIs inflammatory markers are up so I am glad we are continuing antibiotics. May need to let Dr. Stann Mainland know as well re high ESR CRP ----- Message ----- From: Cheyenne Adas Lab Results In Sent: 11/21/2020  10:53 PM EDT To: Truman Hayward, MD

## 2020-11-22 NOTE — Telephone Encounter (Signed)
Attempted to call patient but was unable to reach. 

## 2020-12-13 ENCOUNTER — Ambulatory Visit: Payer: Self-pay | Admitting: Infectious Disease

## 2021-02-12 ENCOUNTER — Inpatient Hospital Stay (HOSPITAL_COMMUNITY)
Admission: EM | Admit: 2021-02-12 | Discharge: 2021-02-16 | DRG: 682 | Disposition: A | Discharge status: Home / Self Care | Payer: Medicaid Other | Attending: Internal Medicine | Admitting: Internal Medicine

## 2021-02-12 ENCOUNTER — Encounter (HOSPITAL_COMMUNITY): Payer: Self-pay | Admitting: Emergency Medicine

## 2021-02-12 ENCOUNTER — Ambulatory Visit (HOSPITAL_COMMUNITY)
Admission: EM | Admit: 2021-02-12 | Discharge: 2021-02-12 | Disposition: A | Discharge status: Home / Self Care | Payer: Self-pay

## 2021-02-12 ENCOUNTER — Other Ambulatory Visit: Payer: Self-pay

## 2021-02-12 ENCOUNTER — Emergency Department (HOSPITAL_COMMUNITY): Payer: Medicaid Other

## 2021-02-12 DIAGNOSIS — Z20822 Contact with and (suspected) exposure to covid-19: Secondary | ICD-10-CM | POA: Diagnosis present

## 2021-02-12 DIAGNOSIS — K269 Duodenal ulcer, unspecified as acute or chronic, without hemorrhage or perforation: Secondary | ICD-10-CM

## 2021-02-12 DIAGNOSIS — Z880 Allergy status to penicillin: Secondary | ICD-10-CM

## 2021-02-12 DIAGNOSIS — N179 Acute kidney failure, unspecified: Principal | ICD-10-CM | POA: Diagnosis present

## 2021-02-12 DIAGNOSIS — R7989 Other specified abnormal findings of blood chemistry: Secondary | ICD-10-CM | POA: Diagnosis present

## 2021-02-12 DIAGNOSIS — Z800 Family history of malignant neoplasm of digestive organs: Secondary | ICD-10-CM

## 2021-02-12 DIAGNOSIS — Z823 Family history of stroke: Secondary | ICD-10-CM

## 2021-02-12 DIAGNOSIS — K264 Chronic or unspecified duodenal ulcer with hemorrhage: Secondary | ICD-10-CM | POA: Diagnosis present

## 2021-02-12 DIAGNOSIS — I8510 Secondary esophageal varices without bleeding: Secondary | ICD-10-CM | POA: Diagnosis present

## 2021-02-12 DIAGNOSIS — K7200 Acute and subacute hepatic failure without coma: Secondary | ICD-10-CM

## 2021-02-12 DIAGNOSIS — Z881 Allergy status to other antibiotic agents status: Secondary | ICD-10-CM

## 2021-02-12 DIAGNOSIS — K7030 Alcoholic cirrhosis of liver without ascites: Secondary | ICD-10-CM

## 2021-02-12 DIAGNOSIS — Z79899 Other long term (current) drug therapy: Secondary | ICD-10-CM

## 2021-02-12 DIAGNOSIS — Z803 Family history of malignant neoplasm of breast: Secondary | ICD-10-CM

## 2021-02-12 DIAGNOSIS — E871 Hypo-osmolality and hyponatremia: Secondary | ICD-10-CM | POA: Diagnosis present

## 2021-02-12 DIAGNOSIS — M1990 Unspecified osteoarthritis, unspecified site: Secondary | ICD-10-CM | POA: Diagnosis present

## 2021-02-12 DIAGNOSIS — D539 Nutritional anemia, unspecified: Secondary | ICD-10-CM | POA: Diagnosis present

## 2021-02-12 DIAGNOSIS — R17 Unspecified jaundice: Secondary | ICD-10-CM

## 2021-02-12 DIAGNOSIS — Z8249 Family history of ischemic heart disease and other diseases of the circulatory system: Secondary | ICD-10-CM

## 2021-02-12 DIAGNOSIS — K7011 Alcoholic hepatitis with ascites: Secondary | ICD-10-CM | POA: Diagnosis present

## 2021-02-12 DIAGNOSIS — E876 Hypokalemia: Secondary | ICD-10-CM | POA: Diagnosis not present

## 2021-02-12 DIAGNOSIS — K259 Gastric ulcer, unspecified as acute or chronic, without hemorrhage or perforation: Secondary | ICD-10-CM

## 2021-02-12 DIAGNOSIS — D62 Acute posthemorrhagic anemia: Secondary | ICD-10-CM | POA: Diagnosis present

## 2021-02-12 DIAGNOSIS — K7031 Alcoholic cirrhosis of liver with ascites: Secondary | ICD-10-CM | POA: Diagnosis present

## 2021-02-12 DIAGNOSIS — K3189 Other diseases of stomach and duodenum: Secondary | ICD-10-CM | POA: Diagnosis present

## 2021-02-12 DIAGNOSIS — R188 Other ascites: Secondary | ICD-10-CM

## 2021-02-12 DIAGNOSIS — K766 Portal hypertension: Secondary | ICD-10-CM | POA: Diagnosis present

## 2021-02-12 DIAGNOSIS — F1020 Alcohol dependence, uncomplicated: Secondary | ICD-10-CM | POA: Diagnosis present

## 2021-02-12 LAB — COMPREHENSIVE METABOLIC PANEL
ALT: 52 U/L — ABNORMAL HIGH (ref 0–44)
AST: 155 U/L — ABNORMAL HIGH (ref 15–41)
Albumin: 2.2 g/dL — ABNORMAL LOW (ref 3.5–5.0)
Alkaline Phosphatase: 273 U/L — ABNORMAL HIGH (ref 38–126)
Anion gap: 9 (ref 5–15)
BUN: 35 mg/dL — ABNORMAL HIGH (ref 6–20)
CO2: 20 mmol/L — ABNORMAL LOW (ref 22–32)
Calcium: 8.2 mg/dL — ABNORMAL LOW (ref 8.9–10.3)
Chloride: 99 mmol/L (ref 98–111)
Creatinine, Ser: 2.69 mg/dL — ABNORMAL HIGH (ref 0.61–1.24)
GFR, Estimated: 27 mL/min — ABNORMAL LOW (ref >60)
Glucose, Bld: 90 mg/dL (ref 70–99)
Potassium: 3.4 mmol/L — ABNORMAL LOW (ref 3.5–5.1)
Sodium: 128 mmol/L — ABNORMAL LOW (ref 135–145)
Total Bilirubin: 23.7 mg/dL (ref 0.3–1.2)
Total Protein: 7.5 g/dL (ref 6.5–8.1)

## 2021-02-12 LAB — URINALYSIS, ROUTINE W REFLEX MICROSCOPIC
Bilirubin Urine: NEGATIVE
Glucose, UA: 100 mg/dL — AB
Ketones, ur: NEGATIVE mg/dL
Nitrite: NEGATIVE
Protein, ur: 100 mg/dL — AB
Specific Gravity, Urine: 1.025 (ref 1.005–1.030)
pH: 6.5 (ref 5.0–8.0)

## 2021-02-12 LAB — URINALYSIS, MICROSCOPIC (REFLEX)

## 2021-02-12 LAB — HEPATITIS PANEL, ACUTE
HCV Ab: NONREACTIVE
Hep A IgM: NONREACTIVE
Hep B C IgM: NONREACTIVE
Hepatitis B Surface Ag: NONREACTIVE

## 2021-02-12 LAB — CBC
HCT: 29.5 % — ABNORMAL LOW (ref 39.0–52.0)
Hemoglobin: 10.2 g/dL — ABNORMAL LOW (ref 13.0–17.0)
MCH: 35.9 pg — ABNORMAL HIGH (ref 26.0–34.0)
MCHC: 34.6 g/dL (ref 30.0–36.0)
MCV: 103.9 fL — ABNORMAL HIGH (ref 80.0–100.0)
Platelets: 185 10*3/uL (ref 150–400)
RBC: 2.84 MIL/uL — ABNORMAL LOW (ref 4.22–5.81)
RDW: 21 % — ABNORMAL HIGH (ref 11.5–15.5)
WBC: 9.8 10*3/uL (ref 4.0–10.5)
nRBC: 0 % (ref 0.0–0.2)

## 2021-02-12 LAB — LIPASE, BLOOD: Lipase: 98 U/L — ABNORMAL HIGH (ref 11–51)

## 2021-02-12 NOTE — ED Triage Notes (Signed)
Patient here with complaint of LUQ abdominal pain that he says started approximately four days ago and jaundice that started last night. Denies emesis and diarrhea. Denies history of liver disease. Patient is alert, oriented, ambulatory, and in no apparent distress at this time.

## 2021-02-12 NOTE — ED Provider Notes (Signed)
Emergency Medicine Provider Triage Evaluation Note  Kyle Pitts , a 60 y.o. male  was evaluated in triage.  Pt complains of abdominal pain and yellowing of his skin.  Patient states that he has had left upper quadrant pain for the past 4 days, with feeling of bloating.  Noted that his skin and his eyes looked yellow starting last night.  Denies history of liver disease, upon chart review patient does have history of cirrhosis.  Reports he was drinking about 6 beers per day, but states that he stopped drinking last week.  Review of Systems  Positive: Abdominal pain, yellowing of skin Negative: Fevers, chills, chest pain, shortness of breath, nausea, vomiting, diarrhea  Physical Exam  BP 114/72 (BP Location: Right Arm)   Pulse 78   Temp 97.8 F (36.6 C) (Oral)   Resp 18   SpO2 100%  Gen:   Awake, no distress   Resp:  Normal effort  MSK:   Moves extremities without difficulty  Other:  Jaundice of skin and conjunctiva, abdominal distention without guarding or rigidity  Medical Decision Making  Medically screening exam initiated at 7:14 PM.  Appropriate orders placed.  [name] was informed that the remainder of the evaluation will be completed by another provider, this initial triage assessment does not replace that evaluation, and the importance of remaining in the ED until their evaluation is complete.     Jeanella Flattery 02/12/21 1916    Charlynne Pander, MD 02/12/21 2229

## 2021-02-12 NOTE — ED Triage Notes (Signed)
Pt presents with abdominal pain x 4 days. State he noticed his skin and eyes turning yellow last night.

## 2021-02-13 ENCOUNTER — Emergency Department (HOSPITAL_COMMUNITY): Payer: Medicaid Other

## 2021-02-13 ENCOUNTER — Inpatient Hospital Stay (HOSPITAL_COMMUNITY): Payer: Medicaid Other

## 2021-02-13 ENCOUNTER — Encounter (HOSPITAL_COMMUNITY): Payer: Self-pay | Admitting: Internal Medicine

## 2021-02-13 DIAGNOSIS — R7989 Other specified abnormal findings of blood chemistry: Secondary | ICD-10-CM

## 2021-02-13 DIAGNOSIS — Z803 Family history of malignant neoplasm of breast: Secondary | ICD-10-CM | POA: Diagnosis not present

## 2021-02-13 DIAGNOSIS — Z881 Allergy status to other antibiotic agents status: Secondary | ICD-10-CM | POA: Diagnosis not present

## 2021-02-13 DIAGNOSIS — D649 Anemia, unspecified: Secondary | ICD-10-CM | POA: Diagnosis not present

## 2021-02-13 DIAGNOSIS — Z79899 Other long term (current) drug therapy: Secondary | ICD-10-CM | POA: Diagnosis not present

## 2021-02-13 DIAGNOSIS — I8510 Secondary esophageal varices without bleeding: Secondary | ICD-10-CM | POA: Diagnosis present

## 2021-02-13 DIAGNOSIS — Z800 Family history of malignant neoplasm of digestive organs: Secondary | ICD-10-CM | POA: Diagnosis not present

## 2021-02-13 DIAGNOSIS — D62 Acute posthemorrhagic anemia: Secondary | ICD-10-CM | POA: Diagnosis present

## 2021-02-13 DIAGNOSIS — D539 Nutritional anemia, unspecified: Secondary | ICD-10-CM | POA: Diagnosis present

## 2021-02-13 DIAGNOSIS — Z880 Allergy status to penicillin: Secondary | ICD-10-CM | POA: Diagnosis not present

## 2021-02-13 DIAGNOSIS — N179 Acute kidney failure, unspecified: Principal | ICD-10-CM | POA: Diagnosis present

## 2021-02-13 DIAGNOSIS — Z20822 Contact with and (suspected) exposure to covid-19: Secondary | ICD-10-CM | POA: Diagnosis present

## 2021-02-13 DIAGNOSIS — F1020 Alcohol dependence, uncomplicated: Secondary | ICD-10-CM | POA: Diagnosis present

## 2021-02-13 DIAGNOSIS — K264 Chronic or unspecified duodenal ulcer with hemorrhage: Secondary | ICD-10-CM | POA: Diagnosis present

## 2021-02-13 DIAGNOSIS — K7031 Alcoholic cirrhosis of liver with ascites: Secondary | ICD-10-CM | POA: Diagnosis present

## 2021-02-13 DIAGNOSIS — K3189 Other diseases of stomach and duodenum: Secondary | ICD-10-CM | POA: Diagnosis present

## 2021-02-13 DIAGNOSIS — K7200 Acute and subacute hepatic failure without coma: Secondary | ICD-10-CM

## 2021-02-13 DIAGNOSIS — M1990 Unspecified osteoarthritis, unspecified site: Secondary | ICD-10-CM | POA: Diagnosis present

## 2021-02-13 DIAGNOSIS — K7011 Alcoholic hepatitis with ascites: Secondary | ICD-10-CM | POA: Diagnosis present

## 2021-02-13 DIAGNOSIS — E876 Hypokalemia: Secondary | ICD-10-CM | POA: Diagnosis not present

## 2021-02-13 DIAGNOSIS — Z823 Family history of stroke: Secondary | ICD-10-CM | POA: Diagnosis not present

## 2021-02-13 DIAGNOSIS — K269 Duodenal ulcer, unspecified as acute or chronic, without hemorrhage or perforation: Secondary | ICD-10-CM | POA: Diagnosis not present

## 2021-02-13 DIAGNOSIS — K766 Portal hypertension: Secondary | ICD-10-CM | POA: Diagnosis present

## 2021-02-13 DIAGNOSIS — R17 Unspecified jaundice: Secondary | ICD-10-CM

## 2021-02-13 DIAGNOSIS — R1012 Left upper quadrant pain: Secondary | ICD-10-CM | POA: Diagnosis present

## 2021-02-13 DIAGNOSIS — K259 Gastric ulcer, unspecified as acute or chronic, without hemorrhage or perforation: Secondary | ICD-10-CM | POA: Diagnosis not present

## 2021-02-13 DIAGNOSIS — E871 Hypo-osmolality and hyponatremia: Secondary | ICD-10-CM | POA: Diagnosis present

## 2021-02-13 DIAGNOSIS — Z8249 Family history of ischemic heart disease and other diseases of the circulatory system: Secondary | ICD-10-CM | POA: Diagnosis not present

## 2021-02-13 LAB — HEPATIC FUNCTION PANEL
ALT: 46 U/L — ABNORMAL HIGH (ref 0–44)
AST: 131 U/L — ABNORMAL HIGH (ref 15–41)
Albumin: 1.9 g/dL — ABNORMAL LOW (ref 3.5–5.0)
Alkaline Phosphatase: 260 U/L — ABNORMAL HIGH (ref 38–126)
Bilirubin, Direct: 11.8 mg/dL — ABNORMAL HIGH (ref 0.0–0.2)
Indirect Bilirubin: 9.2 mg/dL — ABNORMAL HIGH (ref 0.3–0.9)
Total Bilirubin: 21 mg/dL (ref 0.3–1.2)
Total Protein: 6.8 g/dL (ref 6.5–8.1)

## 2021-02-13 LAB — CBC WITH DIFFERENTIAL/PLATELET
Abs Immature Granulocytes: 0.04 10*3/uL (ref 0.00–0.07)
Basophils Absolute: 0.1 10*3/uL (ref 0.0–0.1)
Basophils Relative: 1 %
Eosinophils Absolute: 0.2 10*3/uL (ref 0.0–0.5)
Eosinophils Relative: 2 %
HCT: 26.5 % — ABNORMAL LOW (ref 39.0–52.0)
Hemoglobin: 9 g/dL — ABNORMAL LOW (ref 13.0–17.0)
Immature Granulocytes: 1 %
Lymphocytes Relative: 15 %
Lymphs Abs: 1.2 10*3/uL (ref 0.7–4.0)
MCH: 35.3 pg — ABNORMAL HIGH (ref 26.0–34.0)
MCHC: 34 g/dL (ref 30.0–36.0)
MCV: 103.9 fL — ABNORMAL HIGH (ref 80.0–100.0)
Monocytes Absolute: 1.5 10*3/uL — ABNORMAL HIGH (ref 0.1–1.0)
Monocytes Relative: 20 %
Neutro Abs: 4.8 10*3/uL (ref 1.7–7.7)
Neutrophils Relative %: 61 %
Platelets: 183 10*3/uL (ref 150–400)
RBC: 2.55 MIL/uL — ABNORMAL LOW (ref 4.22–5.81)
RDW: 20.8 % — ABNORMAL HIGH (ref 11.5–15.5)
WBC: 7.7 10*3/uL (ref 4.0–10.5)
nRBC: 0 % (ref 0.0–0.2)

## 2021-02-13 LAB — BASIC METABOLIC PANEL
Anion gap: 9 (ref 5–15)
BUN: 34 mg/dL — ABNORMAL HIGH (ref 6–20)
CO2: 21 mmol/L — ABNORMAL LOW (ref 22–32)
Calcium: 7.9 mg/dL — ABNORMAL LOW (ref 8.9–10.3)
Chloride: 98 mmol/L (ref 98–111)
Creatinine, Ser: 1.88 mg/dL — ABNORMAL HIGH (ref 0.61–1.24)
GFR, Estimated: 41 mL/min — ABNORMAL LOW (ref >60)
Glucose, Bld: 82 mg/dL (ref 70–99)
Potassium: 3.2 mmol/L — ABNORMAL LOW (ref 3.5–5.1)
Sodium: 128 mmol/L — ABNORMAL LOW (ref 135–145)

## 2021-02-13 LAB — PROTIME-INR
INR: 1.4 — ABNORMAL HIGH (ref 0.8–1.2)
INR: 1.5 — ABNORMAL HIGH (ref 0.8–1.2)
Prothrombin Time: 17.5 seconds — ABNORMAL HIGH (ref 11.4–15.2)
Prothrombin Time: 18 seconds — ABNORMAL HIGH (ref 11.4–15.2)

## 2021-02-13 LAB — RESP PANEL BY RT-PCR (FLU A&B, COVID) ARPGX2
Influenza A by PCR: NEGATIVE
Influenza B by PCR: NEGATIVE
SARS Coronavirus 2 by RT PCR: NEGATIVE

## 2021-02-13 LAB — HEPATITIS PANEL, ACUTE
HCV Ab: NONREACTIVE
Hep A IgM: NONREACTIVE
Hep B C IgM: NONREACTIVE
Hepatitis B Surface Ag: NONREACTIVE

## 2021-02-13 LAB — SYNOVIAL CELL COUNT + DIFF, W/ CRYSTALS
Crystals, Fluid: NONE SEEN
Eosinophils-Synovial: 1 % (ref 0–1)
Lymphocytes-Synovial Fld: 16 % (ref 0–20)
Monocyte-Macrophage-Synovial Fluid: 43 % — ABNORMAL LOW (ref 50–90)
Neutrophil, Synovial: 39 % — ABNORMAL HIGH (ref 0–25)
WBC, Synovial: 121 /mm3 (ref 0–200)

## 2021-02-13 LAB — SODIUM, URINE, RANDOM: Sodium, Ur: <10 mmol/L

## 2021-02-13 LAB — ACETAMINOPHEN LEVEL: Acetaminophen (Tylenol), Serum: <10 ug/mL — ABNORMAL LOW (ref 10–30)

## 2021-02-13 LAB — CREATININE, URINE, RANDOM: Creatinine, Urine: 308.57 mg/dL

## 2021-02-13 MED ORDER — SODIUM CHLORIDE 0.9 % IV SOLN: INTRAVENOUS | Status: AC

## 2021-02-13 MED ORDER — THIAMINE HCL 100 MG/ML IJ SOLN: 100.0000 mg | Freq: Every day | INTRAMUSCULAR | Status: DC

## 2021-02-13 MED ORDER — OXYCODONE HCL 5 MG PO TABS
5.0000 mg | ORAL_TABLET | Freq: Once | ORAL | Status: AC
  Administered 2021-02-13: 5 mg via ORAL
  Filled 2021-02-13: qty 1

## 2021-02-13 MED ORDER — FOLIC ACID 1 MG PO TABS
1.0000 mg | ORAL_TABLET | Freq: Every day | ORAL | Status: DC
  Administered 2021-02-13 – 2021-02-16 (×4): 1 mg via ORAL
  Filled 2021-02-13 (×4): qty 1

## 2021-02-13 MED ORDER — THIAMINE HCL 100 MG PO TABS
100.0000 mg | ORAL_TABLET | Freq: Every day | ORAL | Status: DC
  Administered 2021-02-13 – 2021-02-16 (×4): 100 mg via ORAL
  Filled 2021-02-13 (×4): qty 1

## 2021-02-13 MED ORDER — ADULT MULTIVITAMIN W/MINERALS CH
1.0000 | ORAL_TABLET | Freq: Every day | ORAL | Status: DC
  Administered 2021-02-13 – 2021-02-16 (×4): 1 via ORAL
  Filled 2021-02-13 (×3): qty 1

## 2021-02-13 MED ORDER — ALBUMIN HUMAN 25 % IV SOLN
25.0000 g | Freq: Four times a day (QID) | INTRAVENOUS | Status: AC
  Administered 2021-02-13 – 2021-02-14 (×5): 25 g via INTRAVENOUS
  Filled 2021-02-13 (×8): qty 100

## 2021-02-13 MED ORDER — OXYCODONE HCL 5 MG PO TABS
5.0000 mg | ORAL_TABLET | Freq: Four times a day (QID) | ORAL | Status: DC | PRN
  Administered 2021-02-13 – 2021-02-15 (×4): 5 mg via ORAL
  Filled 2021-02-13 (×4): qty 1

## 2021-02-13 MED ORDER — POTASSIUM CHLORIDE 10 MEQ/100ML IV SOLN
10.0000 meq | INTRAVENOUS | Status: AC
  Administered 2021-02-13 (×2): 10 meq via INTRAVENOUS
  Filled 2021-02-13: qty 100

## 2021-02-13 MED ORDER — LACTATED RINGERS IV BOLUS
1000.0000 mL | Freq: Once | INTRAVENOUS | Status: AC
  Administered 2021-02-13: 1000 mL via INTRAVENOUS

## 2021-02-13 NOTE — H&P (Signed)
History and Physical    Kyle Pitts KGS:[id number] DOB: 09-Nov-1960 DOA: 02/12/2021  PCP: [name], MD  Patient coming from: Home.  Chief Complaint: Abdominal pain.  HPI: Kyle Pitts is a 60 y.o. 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  Denies vomiting diarrhea fever chills feels his abdomen is bloated.  Since his last admission for the septic arthritis he has been taking ibuprofen for the pain in the right knee.  Over the last 1 week he has been alternating ibuprofen with Tylenol.  And his last drink was almost a week ago when he decided to quit drinking.  ED Course: In the ER patient appears jaundiced with abdominal distended in the right knee appears swollen and mildly warm to touch which patient states has been like that since June.  Labs show markedly elevated bilirubin which has worsened 1.7 in August 2022 and it is around 23.7 now with creatinine worsening 1.5 in August it is around 2.6.  Bicarb is 20 anion gap is 9.  INR is 1.4 COVID test is negative.  UA shows no crystals trace leukocytes rare bacteria 100 protein.  Patient was given half liter fluid bolus and admitted for acute renal failure likely hepatorenal with acute hepatitis.  CT scan of the abdomen pelvis and US abdomen does not show anything acute.  Review of Systems: As per HPI, rest all negative.   Past Medical History:  Diagnosis Date   Allergic reaction 11/21/2020   Cirrhosis (HCC)    Hyponatremia    Myofasciitis 11/21/2020   Osteomyelitis of right leg (HCC) 11/21/2020   Septic arthritis (HCC)    Streptococcus infection, group A 11/21/2020    Past Surgical History:  Procedure Laterality Date   I & D EXTREMITY Right  10/09/2020   Procedure: IRRIGATION AND DEBRIDEMENT EXTREMITY;  Surgeon: Yolonda Kida, MD;  Location: [facility];  Service: Orthopedics;  Laterality: Right;   KNEE ARTHROSCOPY Right 09/27/2020   Procedure: ARTHROSCOPY KNEE, WASH OUT;  Surgeon: Venita Lick, MD;  Location: [facility];  Service: Orthopedics;  Laterality: Right;   KNEE ARTHROSCOPY Right 10/09/2020   Procedure: ARTHROSCOPY KNEE,ARTHROSCOPIC I & D, CALF ABSCESS;  Surgeon: Yolonda Kida, MD;  Location: [facility];  Service: Orthopedics;  Laterality: Right;     reports that he has never smoked. He has never used smokeless tobacco. He reports current alcohol use of about 24.0 standard drinks per week. He reports that he does not currently use drugs after having used the following drugs: Cocaine and Marijuana.  Allergies  Allergen Reactions   Amoxicillin Other (See Comments)    Itching around eyes and scalp on amoxicilin   Cephalexin Nausea Only    Upset stomach   Penicillins Rash    Family History  Problem Relation Age of Onset   Hepatitis Mother    Cirrhosis Mother    Stroke Father    Heart failure Father     Prior to Admission medications   Medication Sig Start Date End Date Taking? Authorizing Provider  acetaminophen (TYLENOL) 325 MG tablet Take 1-2 tablets (325-650 mg total) by mouth every 6 (six) hours as needed for mild pain (pain score 1-3 or temp >  100.5). 10/02/20  Yes Rhodia Albright, PA-C  ibuprofen (ADVIL) 200 MG tablet Take 600 mg by mouth every 6 (six) hours as needed for fever, headache or mild pain.   Yes [provider]  Multiple Vitamin (MULTIVITAMIN) tablet Take 1 tablet by mouth daily.   Yes [provider]  neomycin-bacitracin-polymyxin (NEOSPORIN) ointment Apply topically 3 (three) times daily. To wound on right upper arm Patient not taking: No sig reported 11/11/20   Osvaldo Shipper, MD  oxyCODONE (OXY IR/ROXICODONE) 5 MG immediate release tablet Take 1 tablet (5 mg total) by mouth  every 6 (six) hours as needed for moderate pain. Patient not taking: No sig reported 11/11/20   Osvaldo Shipper, MD    Physical Exam: Constitutional: Moderately built and nourished. Vitals:   02/13/21 0148 02/13/21 0300 02/13/21 0400 02/13/21 0500  BP: 124/75 128/81 128/79 112/80  Pulse: 72 78 77 69  Resp: 15 14    Temp: 98.2 F (36.8 C)     TempSrc: Oral     SpO2: 100% 100% 96% 99%   Eyes: Jaundice present no pallor. ENMT: No discharge from the ears eyes nose and mouth. Neck: No mass felt.  No neck rigidity. Respiratory: No rhonchi or crepitations. Cardiovascular: S1-S2 heard. Abdomen: Distended nontender bowel sound present. Musculoskeletal: Swelling of the right knee present.  Mildly warm to touch the knee. Skin: Mildly warm to touch the right knee. Neurologic: Alert awake oriented to time place and person.  Moves all extremities. Psychiatric: Appears normal.  Normal affect.   Labs on Admission: I have personally reviewed following labs and imaging studies  CBC: Recent Labs  Lab 02/12/21 1903  WBC 9.8  HGB 10.2*  HCT 29.5*  MCV 103.9*  PLT 185   Basic Metabolic Panel: Recent Labs  Lab 02/12/21 1903  NA 128*  K 3.4*  CL 99  CO2 20*  GLUCOSE 90  BUN 35*  CREATININE 2.69*  CALCIUM 8.2*   GFR: CrCl cannot be calculated (Unknown ideal weight.). Liver Function Tests: Recent Labs  Lab 02/12/21 1903  AST 155*  ALT 52*  ALKPHOS 273*  BILITOT 23.7*  PROT 7.5  ALBUMIN 2.2*   Recent Labs  Lab 02/12/21 1903  LIPASE 98*   No results for input(s): AMMONIA in the last 168 hours. Coagulation Profile: Recent Labs  Lab 02/13/21 0440  INR 1.4*   Cardiac Enzymes: No results for input(s): CKTOTAL, CKMB, CKMBINDEX, TROPONINI in the last 168 hours. BNP (last 3 results) No results for input(s): PROBNP in the last 8760 hours. HbA1C: No results for input(s): HGBA1C in the last 72 hours. CBG: No results for input(s): GLUCAP in the last 168 hours. Lipid  Profile: No results for input(s): CHOL, HDL, LDLCALC, TRIG, CHOLHDL, LDLDIRECT in the last 72 hours. Thyroid Function Tests: No results for input(s): TSH, T4TOTAL, FREET4, T3FREE, THYROIDAB in the last 72 hours. Anemia Panel: No results for input(s): VITAMINB12, FOLATE, FERRITIN, TIBC, IRON, RETICCTPCT in the last 72 hours. Urine analysis:    Component Value Date/Time   COLORURINE BROWN (A) 02/12/2021 1915   APPEARANCEUR TURBID (A) 02/12/2021 1915   LABSPEC 1.025 02/12/2021 1915   PHURINE 6.5 02/12/2021 1915   GLUCOSEU 100 (A) 02/12/2021 1915   HGBUR SMALL (A) 02/12/2021 1915   BILIRUBINUR NEGATIVE 02/12/2021 1915   KETONESUR NEGATIVE 02/12/2021 1915   PROTEINUR 100 (A) 02/12/2021 1915   NITRITE NEGATIVE 02/12/2021 1915   LEUKOCYTESUR TRACE (A) 02/12/2021 1915   Sepsis Labs: @LABRCNTIP (procalcitonin:4,lacticidven:4) ) Recent Results (from the past  240 hour(s))  Resp Panel by RT-PCR (Flu A&B, Covid) Nasopharyngeal Swab     Status: None   Collection Time: 02/13/21  3:43 AM   Specimen: Nasopharyngeal Swab; Nasopharyngeal(NP) swabs in vial transport medium  Result Value Ref Range Status   SARS Coronavirus 2 by RT PCR NEGATIVE NEGATIVE Final    Comment: (NOTE) SARS-CoV-2 target nucleic acids are NOT DETECTED.  The SARS-CoV-2 RNA is generally detectable in upper respiratory specimens during the acute phase of infection. The lowest concentration of SARS-CoV-2 viral copies this assay can detect is 138 copies/mL. A negative result does not preclude SARS-Cov-2 infection and should not be used as the sole basis for treatment or other patient management decisions. A negative result may occur with  improper specimen collection/handling, submission of specimen other than nasopharyngeal swab, presence of viral mutation(s) within the areas targeted by this assay, and inadequate number of viral copies(<138 copies/mL). A negative result must be combined with clinical observations, patient  history, and epidemiological information. The expected result is Negative.  Fact Sheet for Patients:  BloggerCourse.com  Fact Sheet for Healthcare Providers:  SeriousBroker.it  This test is no t yet approved or cleared by the [place] FDA and  has been authorized for detection and/or diagnosis of SARS-CoV-2 by FDA under an Emergency Use Authorization (EUA). This EUA will remain  in effect (meaning this test can be used) for the duration of the COVID-19 declaration under Section 564(b)(1) of the Act, 21 U.S.C.section 360bbb-3(b)(1), unless the authorization is terminated  or revoked sooner.       Influenza A by PCR NEGATIVE NEGATIVE Final   Influenza B by PCR NEGATIVE NEGATIVE Final    Comment: (NOTE) The Xpert Xpress SARS-CoV-2/FLU/RSV plus assay is intended as an aid in the diagnosis of influenza from Nasopharyngeal swab specimens and should not be used as a sole basis for treatment. Nasal washings and aspirates are unacceptable for Xpert Xpress SARS-CoV-2/FLU/RSV testing.  Fact Sheet for Patients: BloggerCourse.com  Fact Sheet for Healthcare Providers: SeriousBroker.it  This test is not yet approved or cleared by the [place] FDA and has been authorized for detection and/or diagnosis of SARS-CoV-2 by FDA under an Emergency Use Authorization (EUA). This EUA will remain in effect (meaning this test can be used) for the duration of the COVID-19 declaration under Section 564(b)(1) of the Act, 21 U.S.C. section 360bbb-3(b)(1), unless the authorization is terminated or revoked.  Performed at [facility], [address] [place], [place] [address]      Radiological Exams on Admission: CT ABDOMEN PELVIS WO CONTRAST  Result Date: 02/13/2021 CLINICAL DATA:  Abdominal pain.  Concern for biliary obstruction. EXAM: CT ABDOMEN AND PELVIS WITHOUT CONTRAST TECHNIQUE:  Multidetector CT imaging of the abdomen and pelvis was performed following the standard protocol without IV contrast. COMPARISON:  None. FINDINGS: Evaluation of this exam is limited in the absence of intravenous contrast. Lower chest: The visualized lung bases are clear. There is coronary vascular calcification. There is hypoattenuation of the cardiac blood pool suggestive of anemia. Clinical correlation is recommended. No intra-abdominal free air.  Small ascites. Hepatobiliary: Fatty liver with morphologic changes of cirrhosis. No intrahepatic biliary ductal dilatation. Probable sludge within the gallbladder. Pancreas: Unremarkable. No pancreatic ductal dilatation or surrounding inflammatory changes. Spleen: Normal in size without focal abnormality. Adrenals/Urinary Tract: The adrenal glands unremarkable. The kidneys, visualized ureters, and urinary bladder are unremarkable. Stomach/Bowel: There is no bowel obstruction or active inflammation. No CT evidence of acute appendicitis. Vascular/Lymphatic: Mild aortoiliac  atherosclerotic disease. The IVC is unremarkable. No portal gas. There is no adenopathy. Reproductive: The prostate and seminal vesicles are grossly unremarkable. No pelvic mass. Other: None Musculoskeletal: Extensive degenerative changes of the spine. No acute osseous pathology. IMPRESSION: 1. No acute intra-abdominal or pelvic pathology. No bowel obstruction. 2. Fatty liver with morphologic changes of cirrhosis. Small ascites. 3. Aortic Atherosclerosis (ICD10-I70.0). Electronically Signed   By: Elgie Collard M.D.   On: 02/13/2021 02:42   US Abdomen Limited RUQ (LIVER/GB)  Result Date: 02/12/2021 CLINICAL DATA:  Jaundice. EXAM: ULTRASOUND ABDOMEN LIMITED RIGHT UPPER QUADRANT COMPARISON:  None. FINDINGS: Gallbladder: There is sludge within the gallbladder. The gallbladder wall is diffusely thickened measuring up to 5 mm. There is a small pericholecystic fluid. This may be related to underlying  liver disease, given negative sonographic Murphy's sign. Acute cholecystitis is not entirely excluded. Common bile duct: Diameter: 4 mm Liver: Diffuse increased liver echogenicity with nodular contour consistent with cirrhosis. Portal vein is patent on color Doppler imaging with normal direction of blood flow towards the liver. Other: Small perihepatic sinus. IMPRESSION: 1. Cirrhosis and small ascites. 2. Patent main portal vein with hepatopetal flow. 3. Gallbladder sludge. Diffuse thickened appearance of the gallbladder wall, likely related to underlying liver disease. Clinical correlation is recommended. Electronically Signed   By: Elgie Collard M.D.   On: 02/12/2021 23:32      Assessment/Plan Active Problems:   ARF (acute renal failure) (HCC)   Elevated LFTs    Abnormal LFTs likely could be acute alcoholic hepatitis however since patient has been recently taking more Tylenol we will check Tylenol levels.  Check acute hepatitis panel.  Follow LFTs.  Will consult GI. Acute renal failure creatinine has significantly worsened over the last 3 months when compared to the one in office.  Patient has been taking ibuprofen which could be contributing.  We will keep patient on IV fluids and albumin and advised patient not to take NSAIDs.  Follow intake output metabolic panel. Epigastric discomfort I suspect could be from alcoholic hepatitis.  CT scan does not show any definite evidence of any pancreatitis though lipase is mildly elevated.  We will closely monitor. History of alcohol abuse has not had any alcohol for the last 1 week as per the patient.  We will keep patient on CIWA closely monitor for any withdrawals.  Thiamine. Right knee swelling which patient states has been persistently similar since this surgery in June of this year for septic arthritis and myofascitis.  On my exam patient's right knee appears swollen and warm to touch.  We will get a CT scan to look further into it.  If there is  obvious effusion probably we need to reconsult orthopedics. Anemia macrocytic appears to be chronic follow CBC.  Likely secondary to alcoholism.  We will check anemia panel with next blood draw.  Since patient has acute renal failure with markedly worsening liver function will need close monitoring for any further worsening inpatient status.   DVT prophylaxis: SCDs.  Avoiding anticoagulation in the setting of abnormal LFTs. Code Status: Full code. Family Communication: Discussed with patient. Disposition Plan: Home. Consults called: We will consult GI. Admission status: Inpatient.   Eduard Clos MD [facility] Pager [phone].  If 7PM-7AM, please contact night-coverage [email or web address] Password [email or web address]  02/13/2021, 6:18 AM

## 2021-02-13 NOTE — Consult Note (Signed)
Reason for Consult: Right knee swelling Referring Physician: Hospitalist  Kyle Pitts is an 60 y.o. male.   HPI: 60 year old gentleman said a history of septic knee on the right who is status post knee arthroscopy debridement fasciotomy and placement of a PICC line followed by oral antibiotics completed 6 weeks ago.  Patient is admitted for alcoholic cirrhosis.  And was noted to have some swelling in his right knee.  And a CT scan indicated possible septic arthritis.  Patient had moderate arthritic changes in his knee on arthroscopy according to Dr. [name] Patient reports the knee is not particularly more painful than it has been.  And actually the swelling is less than it has been.  No fevers or chills  Past Medical History:  Diagnosis Date   Allergic reaction 11/21/2020   Cirrhosis (HCC)    Hyponatremia    Myofasciitis 11/21/2020   Osteomyelitis of right leg (HCC) 11/21/2020   Septic arthritis (HCC)    Streptococcus infection, group A 11/21/2020    Past Surgical History:  Procedure Laterality Date   I & D EXTREMITY Right 10/09/2020   Procedure: IRRIGATION AND DEBRIDEMENT EXTREMITY;  Surgeon: Yolonda Kida, MD;  Location: [facility];  Service: Orthopedics;  Laterality: Right;   KNEE ARTHROSCOPY Right 09/27/2020   Procedure: ARTHROSCOPY KNEE, WASH OUT;  Surgeon: Venita Lick, MD;  Location: [facility];  Service: Orthopedics;  Laterality: Right;   KNEE ARTHROSCOPY Right 10/09/2020   Procedure: ARTHROSCOPY KNEE,ARTHROSCOPIC I & D, CALF ABSCESS;  Surgeon: Yolonda Kida, MD;  Location: [facility];  Service: Orthopedics;  Laterality: Right;    Family History  Problem Relation Age of Onset   Hepatitis Mother    Cirrhosis Mother    Stroke Father    Heart failure Father     Social History:  reports that he has never smoked. He has never used smokeless tobacco. He reports current alcohol use of about 24.0 standard drinks per week. He reports that he does not currently use drugs after  having used the following drugs: Cocaine and Marijuana.  Allergies:  Allergies  Allergen Reactions   Amoxicillin Other (See Comments)    Itching around eyes and scalp on amoxicilin   Cephalexin Nausea Only    Upset stomach   Penicillins Rash    Medications: I have reviewed the patient's current medications.  Results for orders placed or performed during the hospital encounter of 02/12/21 (from the past 48 hour(s))  Lipase, blood     Status: Abnormal   Collection Time: 02/12/21  7:03 PM  Result Value Ref Range   Lipase 98 (H) 11 - 51 U/L    Comment: Performed at [facility], [address], [place], [place] [address]  Comprehensive metabolic panel     Status: Abnormal   Collection Time: 02/12/21  7:03 PM  Result Value Ref Range   Sodium 128 (L) 135 - 145 mmol/L   Potassium 3.4 (L) 3.5 - 5.1 mmol/L   Chloride 99 98 - 111 mmol/L   CO2 20 (L) 22 - 32 mmol/L   Glucose, Bld 90 70 - 99 mg/dL    Comment: Glucose reference range applies only to samples taken after fasting for at least 8 hours.   BUN 35 (H) 6 - 20 mg/dL   Creatinine, Ser 6.54 (H) 0.61 - 1.24 mg/dL   Calcium 8.2 (L) 8.9 - 10.3 mg/dL   Total Protein 7.5 6.5 - 8.1 g/dL   Albumin 2.2 (L) 3.5 - 5.0 g/dL  AST 155 (H) 15 - 41 U/L   ALT 52 (H) 0 - 44 U/L   Alkaline Phosphatase 273 (H) 38 - 126 U/L   Total Bilirubin 23.7 (HH) 0.3 - 1.2 mg/dL    Comment: CRITICAL RESULT CALLED TO, READ BACK BY AND VERIFIED WITH: BAILEY LOISC,RN 2045 02/12/2021 WBOND    GFR, Estimated 27 (L) >60 mL/min    Comment: (NOTE) Calculated using the CKD-EPI Creatinine Equation (2021)    Anion gap 9 5 - 15    Comment: Performed at [facility], [address] [place], [place] [address]  CBC     Status: Abnormal   Collection Time: 02/12/21  7:03 PM  Result Value Ref Range   WBC 9.8 4.0 - 10.5 K/uL   RBC 2.84 (L) 4.22 - 5.81 MIL/uL   Hemoglobin 10.2 (L) 13.0 - 17.0 g/dL   HCT 59.5 (L) 63.8 - 75.6 %   MCV 103.9 (H) 80.0 - 100.0 fL    MCH 35.9 (H) 26.0 - 34.0 pg   MCHC 34.6 30.0 - 36.0 g/dL   RDW 43.3 (H) 29.5 - 18.8 %   Platelets 185 150 - 400 K/uL   nRBC 0.0 0.0 - 0.2 %    Comment: Performed at [facility], [address], [place], [place] [address]  Hepatitis panel, acute     Status: None   Collection Time: 02/12/21  7:03 PM  Result Value Ref Range   Hepatitis B Surface Ag NON REACTIVE NON REACTIVE   HCV Ab NON REACTIVE NON REACTIVE    Comment: (NOTE) Nonreactive HCV antibody screen is consistent with no HCV infections,  unless recent infection is suspected or other evidence exists to indicate HCV infection.     Hep A IgM NON REACTIVE NON REACTIVE   Hep B C IgM NON REACTIVE NON REACTIVE    Comment: Performed at [facility], [address] [place], [place] [address]  Urinalysis, Routine w reflex microscopic     Status: Abnormal   Collection Time: 02/12/21  7:15 PM  Result Value Ref Range   Color, Urine BROWN (A) YELLOW    Comment: BIOCHEMICALS MAY BE AFFECTED BY COLOR CORRECTED ON 10/26 AT 2035: PREVIOUSLY REPORTED AS BROWN    APPearance TURBID (A) CLEAR   Specific Gravity, Urine 1.025 1.005 - 1.030   pH 6.5 5.0 - 8.0   Glucose, UA 100 (A) NEGATIVE mg/dL   Hgb urine dipstick SMALL (A) NEGATIVE   Bilirubin Urine NEGATIVE NEGATIVE    Comment: LARGE   Ketones, ur NEGATIVE NEGATIVE mg/dL   Protein, ur 010 (A) NEGATIVE mg/dL   Nitrite NEGATIVE NEGATIVE   Leukocytes,Ua TRACE (A) NEGATIVE    Comment: Performed at [facility], [address] [place], [place] [address]  Urinalysis, Microscopic (reflex)     Status: Abnormal   Collection Time: 02/12/21  7:15 PM  Result Value Ref Range   RBC / HPF 0-5 0 - 5 RBC/hpf   WBC, UA 0-5 0 - 5 WBC/hpf   Bacteria, UA RARE (A) NONE SEEN   Squamous Epithelial / LPF 0-5 0 - 5    Comment: Performed at [facility], [address] [place], [place] [address]  Sodium, urine, random     Status: None   Collection Time: 02/12/21  7:15 PM  Result  Value Ref Range   Sodium, Ur <10 mmol/L    Comment: Performed at [facility], [address]  [place], [place] [address]  Creatinine, urine, random     Status: None   Collection Time: 02/12/21  7:15 PM  Result Value Ref Range   Creatinine, Urine 308.57 mg/dL    Comment: Performed at [facility], [address] [place], [place] [address]  Resp Panel by RT-PCR (Flu A&B, Covid) Nasopharyngeal Swab     Status: None   Collection Time: 02/13/21  3:43 AM   Specimen: Nasopharyngeal Swab; Nasopharyngeal(NP) swabs in vial transport medium  Result Value Ref Range   SARS Coronavirus 2 by RT PCR NEGATIVE NEGATIVE    Comment: (NOTE) SARS-CoV-2 target nucleic acids are NOT DETECTED.  The SARS-CoV-2 RNA is generally detectable in upper respiratory specimens during the acute phase of infection. The lowest concentration of SARS-CoV-2 viral copies this assay can detect is 138 copies/mL. A negative result does not preclude SARS-Cov-2 infection and should not be used as the sole basis for treatment or other patient management decisions. A negative result may occur with  improper specimen collection/handling, submission of specimen other than nasopharyngeal swab, presence of viral mutation(s) within the areas targeted by this assay, and inadequate number of viral copies(<138 copies/mL). A negative result must be combined with clinical observations, patient history, and epidemiological information. The expected result is Negative.  Fact Sheet for Patients:  BloggerCourse.com  Fact Sheet for Healthcare Providers:  SeriousBroker.it  This test is no t yet approved or cleared by the [place] FDA and  has been authorized for detection and/or diagnosis of SARS-CoV-2 by FDA under an Emergency Use Authorization (EUA). This EUA will remain  in effect (meaning this test can be used) for the duration of the COVID-19 declaration under Section  564(b)(1) of the Act, 21 U.S.C.section 360bbb-3(b)(1), unless the authorization is terminated  or revoked sooner.       Influenza A by PCR NEGATIVE NEGATIVE   Influenza B by PCR NEGATIVE NEGATIVE    Comment: (NOTE) The Xpert Xpress SARS-CoV-2/FLU/RSV plus assay is intended as an aid in the diagnosis of influenza from Nasopharyngeal swab specimens and should not be used as a sole basis for treatment. Nasal washings and aspirates are unacceptable for Xpert Xpress SARS-CoV-2/FLU/RSV testing.  Fact Sheet for Patients: BloggerCourse.com  Fact Sheet for Healthcare Providers: SeriousBroker.it  This test is not yet approved or cleared by the [place] FDA and has been authorized for detection and/or diagnosis of SARS-CoV-2 by FDA under an Emergency Use Authorization (EUA). This EUA will remain in effect (meaning this test can be used) for the duration of the COVID-19 declaration under Section 564(b)(1) of the Act, 21 U.S.C. section 360bbb-3(b)(1), unless the authorization is terminated or revoked.  Performed at [facility], [address], [place], [place] [address]   Protime-INR     Status: Abnormal   Collection Time: 02/13/21  4:40 AM  Result Value Ref Range   Prothrombin Time 17.5 (H) 11.4 - 15.2 seconds   INR 1.4 (H) 0.8 - 1.2    Comment: (NOTE) INR goal varies based on device and disease states. Performed at [facility], [address] [place], [place] [address]   Hepatic function panel     Status: Abnormal   Collection Time: 02/13/21  7:52 AM  Result Value Ref Range   Total Protein 6.8 6.5 - 8.1 g/dL   Albumin 1.9 (L) 3.5 - 5.0 g/dL   AST 132 (H) 15 - 41 U/L   ALT 46 (H) 0 - 44 U/L   Alkaline  Phosphatase 260 (H) 38 - 126 U/L   Total Bilirubin 21.0 (HH) 0.3 - 1.2 mg/dL    Comment: CRITICAL VALUE NOTED.  VALUE IS CONSISTENT WITH PREVIOUSLY REPORTED AND CALLED VALUE.   Bilirubin, Direct 11.8 (H) 0.0 -  0.2 mg/dL    Comment: RESULTS CONFIRMED BY MANUAL DILUTION   Indirect Bilirubin 9.2 (H) 0.3 - 0.9 mg/dL    Comment: Performed at [facility], [address] [place], [place] [address]  Basic metabolic panel     Status: Abnormal   Collection Time: 02/13/21  7:52 AM  Result Value Ref Range   Sodium 128 (L) 135 - 145 mmol/L   Potassium 3.2 (L) 3.5 - 5.1 mmol/L   Chloride 98 98 - 111 mmol/L   CO2 21 (L) 22 - 32 mmol/L   Glucose, Bld 82 70 - 99 mg/dL    Comment: Glucose reference range applies only to samples taken after fasting for at least 8 hours.   BUN 34 (H) 6 - 20 mg/dL   Creatinine, Ser 6.04 (H) 0.61 - 1.24 mg/dL   Calcium 7.9 (L) 8.9 - 10.3 mg/dL   GFR, Estimated 41 (L) >60 mL/min    Comment: (NOTE) Calculated using the CKD-EPI Creatinine Equation (2021)    Anion gap 9 5 - 15    Comment: Performed at [facility], [address], [place], [place] [address]  CBC WITH DIFFERENTIAL     Status: Abnormal   Collection Time: 02/13/21  7:52 AM  Result Value Ref Range   WBC 7.7 4.0 - 10.5 K/uL   RBC 2.55 (L) 4.22 - 5.81 MIL/uL   Hemoglobin 9.0 (L) 13.0 - 17.0 g/dL   HCT 11.9 (L) 14.7 - 82.9 %   MCV 103.9 (H) 80.0 - 100.0 fL   MCH 35.3 (H) 26.0 - 34.0 pg   MCHC 34.0 30.0 - 36.0 g/dL   RDW 56.2 (H) 13.0 - 86.5 %   Platelets 183 150 - 400 K/uL   nRBC 0.0 0.0 - 0.2 %   Neutrophils Relative % 61 %   Neutro Abs 4.8 1.7 - 7.7 K/uL   Lymphocytes Relative 15 %   Lymphs Abs 1.2 0.7 - 4.0 K/uL   Monocytes Relative 20 %   Monocytes Absolute 1.5 (H) 0.1 - 1.0 K/uL   Eosinophils Relative 2 %   Eosinophils Absolute 0.2 0.0 - 0.5 K/uL   Basophils Relative 1 %   Basophils Absolute 0.1 0.0 - 0.1 K/uL   Immature Granulocytes 1 %   Abs Immature Granulocytes 0.04 0.00 - 0.07 K/uL    Comment: Performed at [facility], [address] [place], [place] [address]  Protime-INR     Status: Abnormal   Collection Time: 02/13/21  7:52 AM  Result Value Ref Range   Prothrombin Time  18.0 (H) 11.4 - 15.2 seconds   INR 1.5 (H) 0.8 - 1.2    Comment: (NOTE) INR goal varies based on device and disease states. Performed at [facility], [address] [place], [place] [address]   Hepatitis panel, acute     Status: None   Collection Time: 02/13/21  7:52 AM  Result Value Ref Range   Hepatitis B Surface Ag NON REACTIVE NON REACTIVE   HCV Ab NON REACTIVE NON REACTIVE    Comment: (NOTE) Nonreactive HCV antibody screen is consistent with no HCV infections,  unless recent infection is suspected or other evidence exists to indicate HCV infection.  Hep A IgM NON REACTIVE NON REACTIVE   Hep B C IgM NON REACTIVE NON REACTIVE    Comment: Performed at [facility], [address], [place], [place] [address]  Acetaminophen level     Status: Abnormal   Collection Time: 02/13/21  7:52 AM  Result Value Ref Range   Acetaminophen (Tylenol), Serum <10 (L) 10 - 30 ug/mL    Comment: (NOTE) Therapeutic concentrations vary significantly. A range of 10-30 ug/mL  may be an effective concentration for many patients. However, some  are best treated at concentrations outside of this range. Acetaminophen concentrations >150 ug/mL at 4 hours after ingestion  and >50 ug/mL at 12 hours after ingestion are often associated with  toxic reactions.  Performed at [facility], [address], [place], [place] [address]     CT ABDOMEN PELVIS WO CONTRAST  Result Date: 02/13/2021 CLINICAL DATA:  Abdominal pain.  Concern for biliary obstruction. EXAM: CT ABDOMEN AND PELVIS WITHOUT CONTRAST TECHNIQUE: Multidetector CT imaging of the abdomen and pelvis was performed following the standard protocol without IV contrast. COMPARISON:  None. FINDINGS: Evaluation of this exam is limited in the absence of intravenous contrast. Lower chest: The visualized lung bases are clear. There is coronary vascular calcification. There is hypoattenuation of the cardiac blood pool suggestive of anemia.  Clinical correlation is recommended. No intra-abdominal free air.  Small ascites. Hepatobiliary: Fatty liver with morphologic changes of cirrhosis. No intrahepatic biliary ductal dilatation. Probable sludge within the gallbladder. Pancreas: Unremarkable. No pancreatic ductal dilatation or surrounding inflammatory changes. Spleen: Normal in size without focal abnormality. Adrenals/Urinary Tract: The adrenal glands unremarkable. The kidneys, visualized ureters, and urinary bladder are unremarkable. Stomach/Bowel: There is no bowel obstruction or active inflammation. No CT evidence of acute appendicitis. Vascular/Lymphatic: Mild aortoiliac atherosclerotic disease. The IVC is unremarkable. No portal gas. There is no adenopathy. Reproductive: The prostate and seminal vesicles are grossly unremarkable. No pelvic mass. Other: None Musculoskeletal: Extensive degenerative changes of the spine. No acute osseous pathology. IMPRESSION: 1. No acute intra-abdominal or pelvic pathology. No bowel obstruction. 2. Fatty liver with morphologic changes of cirrhosis. Small ascites. 3. Aortic Atherosclerosis (ICD10-I70.0). Electronically Signed   By: Elgie Collard M.D.   On: 02/13/2021 02:42   CT KNEE RIGHT WO CONTRAST  Result Date: 02/13/2021 CLINICAL DATA:  Knee instability, prior surgery on right knee due to osteomyelitis EXAM: CT OF THE right KNEE WITHOUT CONTRAST TECHNIQUE: Multidetector CT imaging of the right knee was performed according to the standard protocol. Multiplanar CT image reconstructions were also generated. COMPARISON:  Partially visualized on prior MRI in June 2022, radiograph 09/27/2020 FINDINGS: Bones/Joint/Cartilage There is tricompartment osteophyte formation, subchondral sclerosis, and cystic change. There is severe medial and lateral joint space narrowing, which appears progressed since the prior radiograph. There is marked articular surface irregularity in the medial and lateral compartments. There is  articular surface collapse measuring up to 6 mm deep and 2.7 x 1.8 cm wide along the periphery of the lateral tibial plateau (coronal image 41, sagittal image 31). There is subchondral lucency along the lateral femoral condyle anteriorly. There is the appearance of focal bony erosion at the far posterolateral aspect of the lateral tibial plateau (sagittal image 8, coronal image 55, and similar erosive change at the anterior aspect of the fibular head at the tibiofibular joint. These osseous changes are not clearly seen on prior MRI, though the knee is partially imaged on that exam. There is a small to moderate size  knee joint effusion, which appears somewhat heterogeneous. Ligaments Suboptimally assessed by CT. Muscles and Tendons No muscle atrophy. An ovoid collection just posterior to the fibula which appears to course between the popliteus and soleus muscles measuring up to 2.8 x 2.2 x 4.2 cm (series 4, image 53, coronal image 62). Soft tissues There is diffuse soft tissue swelling along the knee. IMPRESSION: Findings are suspicious for ongoing septic arthritis of the right knee. Recommend knee MRI and joint aspiration. Bones: Severe progressive arthritis with marked articular surface irregularity, erosive changes at the far posterolateral aspect of the lateral tibial plateau and fibular head at the tibiofibular joint. New depressed lateral tibial plateau fracture with 6 mm articular surface depression, which could potentially be pathologic due to underlying infection. Fluid/Soft tissues: Small to moderate-sized joint effusion which appears heterogeneous. Possible fluid collection extending from the posterolateral knee joint inferiorly between the popliteus and soleus muscles which measures 2.8 x 2.2 x 4.2 cm. Electronically Signed   By: Caprice Renshaw M.D.   On: 02/13/2021 08:48   US Abdomen Limited RUQ (LIVER/GB)  Result Date: 02/12/2021 CLINICAL DATA:  Jaundice. EXAM: ULTRASOUND ABDOMEN LIMITED RIGHT UPPER  QUADRANT COMPARISON:  None. FINDINGS: Gallbladder: There is sludge within the gallbladder. The gallbladder wall is diffusely thickened measuring up to 5 mm. There is a small pericholecystic fluid. This may be related to underlying liver disease, given negative sonographic Murphy's sign. Acute cholecystitis is not entirely excluded. Common bile duct: Diameter: 4 mm Liver: Diffuse increased liver echogenicity with nodular contour consistent with cirrhosis. Portal vein is patent on color Doppler imaging with normal direction of blood flow towards the liver. Other: Small perihepatic sinus. IMPRESSION: 1. Cirrhosis and small ascites. 2. Patent main portal vein with hepatopetal flow. 3. Gallbladder sludge. Diffuse thickened appearance of the gallbladder wall, likely related to underlying liver disease. Clinical correlation is recommended. Electronically Signed   By: Elgie Collard M.D.   On: 02/12/2021 23:32    Review of Systems  Gastrointestinal:  Positive for abdominal pain.  Musculoskeletal:  Positive for joint swelling.  All other systems reviewed and are negative. Blood pressure 119/73, pulse 64, temperature 97.9 F (36.6 C), temperature source Oral, resp. rate 13, SpO2 97 %. Physical Exam HENT:     Head: Atraumatic.  Cardiovascular:     Rate and Rhythm: Normal rate.  Pulmonary:     Effort: Pulmonary effort is normal.  Abdominal:     General: Abdomen is protuberant.  Skin:    General: Skin is warm.  Neurological:     General: No focal deficit present.   Right knee was examined.  He is actively ranging from 0-1 10 without pain.  There is a mild to moderate effusion.  There is no erythema.  He has some mild joint line tenderness medially and laterally.  He has a healing granulated wound on the anterolateral aspect of his leg.  That does not appear to be infected.  He does have some mild soft tissue swelling in the leg and foot.  No evidence of DVT.  He is neurovascularly  intact.    Assessment/Plan:  Patient demonstrates a post septic arthritis with severe degenerative arthrosis.  He is status post I&D and IV antibiotics.  Currently the patient has no pain with range of motion.  And his knee represents more of a chronic arthritic effusion than an acute septic arthrosis.  His CT scan demonstrates significant degenerative changes particularly in the lateral compartment with bony erosion.  A small fluid  collection as well.  Patient is reported that he has not had any significant change in his soft tissue swelling in the legs and seen last in the outpatient office in August.  In fact he reports his pain is better.  CT scan indicates a lateral tibial plateau depressed fracture.  Perhaps related either to osteonecrosis and a possibility of osteomyelitis.  Patient is afebrile.  With a normal white cell count. Despite the benign appearance on his clinical exam we proceeded with a aspiration.  I sterilely prepped the right knee under strict technique we aspirated proximately 15 cc of clear synovial fluid with some blood as well.  No purulence was obtained.  It was sent for gram stain and cultures. I have spoken with Dr. [name] concerning this patient. He will follow and recommend treatment accordingly.  Ultimately the patient will require a total knee replacement.  If his comorbidities do not preclude that.  For now we will keep him nonweightbearing.  I will discuss these findings with Dr. [name] and he will follow-up accordingly.      Javier Docker 02/13/2021, 5:39 PM   [phone]

## 2021-02-13 NOTE — Consult Note (Addendum)
[facility] Consult: 10:59 AM 02/13/2021  LOS: 0 days    Referring Provider: Dr Darrick Meigs Primary Care Physician:  Ricard Dillon, MD Primary Gastroenterologist:  Dr. Marlynn Perking     Reason for Consultation: Acute alcoholic hepatitis.   HPI: Kyle Pitts is a 60 y.o. male.  PMH Septic arthritis with strep pyogenes, possible osteomyelitis, fasciitis of the R lower extremity. Knee surgery and fasciotomy summer 2022.  Has finished abx ~ 6 weeks ago under care of ID.   During hospital admissions in June and July 2022 for the knee problem he had alk phos in the mid 200s -340, bilirubin up to 4.4.  AST/ALT 60s/30s.  Hepatitis serologies nonreactive.  Renal function normal.  Received blood transfusion when for Hgb 6.5 and 5.8, MCV 106.  Hgb 9.6 by early August.  Anemia studies in July with normal iron, normal iron sats.  Ferritin was 832, TIBC 182, folate normal at 6.5, B12 elevated in the 900s.  He did not have any imaging of the abdomen such as ultrasound or CT but he recalls being told he had "a touch of cirrhosis".  No previous EGD or colonoscopy.  Last week the patient was having abdominal swelling though not associated with dramatic increase of abdominal girth.  He stopped drinking but previous pattern of consumption of up to 6 beers daily and 1/5 of vodka every 2 to 3 days.  For 4-5 days having discomfort but not severe, burning quality, pain in the left mid to upper abdomen.  For at least a few days he has noticed scleral icterus and jaundice of the skin.  Appetite is variable but that is unchanged and not new.  Denies nausea or vomiting.  Nuys excessive or unusual bleeding or bruising.  No dysphagia.  Urine has been dark at times in recent days.  Denies pruritus.  Patient takes on average anywhere from 800 to 1600 mg of  ibuprofen daily for knee pain.  In the last couple of days he has used Tylenol but does not make a daily habit of this. Came to ED yesterday afternoon for evaluation of the left abdominal discomfort. No fever.  Blood pressures soft with readings as low as 96/83.  Sats mid to upper 90s on room air.  Heart rate in the 70s. T bili 21.  Alk phos 260.  AST/ALT 131/46. Na 128.  Potassium 3.2.  UN/creatinine 35/2.6. Hgb 9.  MCV 104.  Platelets 183.  INR 1.5. Acute hepatitis serologies are all negative. Urine Na < 10.   MELD-Na: 28 MDF: 49 CTAP w/O contrast: Fatty liver, cirrhosis.  Small ascites.  Aortic atherosclerosis.  Nonobstructive BGP.  Abdominal ultrasound with Dopplers: Showed small ascites, cirrhosis.  Patent main portal vein with hepatopetal flow.  Sludge in gallbladder which is diffusely thickened likely due to underlying liver disease.  CBD 4 mm.  Family history considerable and includes liver cancer but no cirrhosis in his nonalcoholic brother.  Colon cancer in his father from which she recovered only to die from stroke and coronary disease.  His mother developed  viral hepatitis from her job as a Psychologist, counselling at a nursing home and died at 75.  His sister is battling breast cancer currently.  Family history of autoimmune disease.  Single, divorced.  Adult children aged 7 and 6.  Has not been at work for many months due to his septic arthritis but previously employed as a Consulting civil engineer.  Currently has sister living with him at his home as she is dealing with illness.    Past Medical History:  Diagnosis Date   Allergic reaction 11/21/2020   Cirrhosis ([place])    Hyponatremia    Myofasciitis 11/21/2020   Osteomyelitis of right leg ([place]) 11/21/2020   Septic arthritis ([place])    Streptococcus infection, group A 11/21/2020    Past Surgical History:  Procedure Laterality Date   I & D EXTREMITY Right 10/09/2020   Procedure: IRRIGATION AND DEBRIDEMENT EXTREMITY;  Surgeon:  Nicholes Stairs, MD;  Location: [facility];  Service: Orthopedics;  Laterality: Right;   KNEE ARTHROSCOPY Right 09/27/2020   Procedure: ARTHROSCOPY KNEE, [place];  Surgeon: Melina Schools, MD;  Location: [facility];  Service: Orthopedics;  Laterality: Right;   KNEE ARTHROSCOPY Right 10/09/2020   Procedure: ARTHROSCOPY KNEE,ARTHROSCOPIC I & D, CALF ABSCESS;  Surgeon: Nicholes Stairs, MD;  Location: [facility];  Service: Orthopedics;  Laterality: Right;    Prior to Admission medications   Medication Sig Start Date End Date Taking? Authorizing Provider  acetaminophen (TYLENOL) 325 MG tablet Take 1-2 tablets (325-650 mg total) by mouth every 6 (six) hours as needed for mild pain (pain score 1-3 or temp > 100.5). 10/02/20  Yes Charlyne Petrin, PA-C  ibuprofen (ADVIL) 200 MG tablet Take 600 mg by mouth every 6 (six) hours as needed for fever, headache or mild pain.   Yes [provider]  Multiple Vitamin (MULTIVITAMIN) tablet Take 1 tablet by mouth daily.   Yes [provider]  neomycin-bacitracin-polymyxin (NEOSPORIN) ointment Apply topically 3 (three) times daily. To wound on right upper arm Patient not taking: No sig reported 11/11/20   Bonnielee Haff, MD  oxyCODONE (OXY IR/ROXICODONE) 5 MG immediate release tablet Take 1 tablet (5 mg total) by mouth every 6 (six) hours as needed for moderate pain. Patient not taking: No sig reported 11/11/20   Bonnielee Haff, MD    Scheduled Meds:  folic acid  1 mg Oral Daily   multivitamin with minerals  1 tablet Oral Daily   thiamine  100 mg Oral Daily   Or   thiamine  100 mg Intravenous Daily   Infusions:  sodium chloride 75 mL/hr at 02/13/21 0657   albumin human 25 g (02/13/21 0756)   PRN Meds:    Allergies as of 02/12/2021 - Review Complete 02/12/2021  Allergen Reaction Noted   Amoxicillin Other (See Comments) 11/21/2020   Cephalexin Nausea Only 01/13/2007    Family History  Problem Relation Age of Onset   Hepatitis Mother     Cirrhosis Mother    Stroke Father    Heart failure Father     Social History   Socioeconomic History   Marital status: Divorced    Spouse name: Not on file   Number of children: Not on file   Years of education: Not on file   Highest education level: Not on file  Occupational History   Not on file  Tobacco Use   Smoking status: Never   Smokeless tobacco: Never  Vaping Use   Vaping Use: Never used  Substance  and Sexual Activity   Alcohol use: Yes    Alcohol/week: 24.0 standard drinks    Types: 24 Cans of beer per week    Comment: 3-4 when home from work   Drug use: Not Currently    Types: Cocaine, Marijuana    Comment: last used 1 month ago   Sexual activity: Not Currently  Other Topics Concern   Not on file  Social History Narrative   Not on file   Social Determinants of Health   Financial Resource Strain: Not on file  Food Insecurity: Not on file  Transportation Needs: Not on file  Physical Activity: Not on file  Stress: Not on file  Social Connections: Not on file  Intimate Partner Violence: Not on file    REVIEW OF SYSTEMS: Constitutional: Feeling a bit weak but this is not profound. ENT:  No nose bleeds Pulm: No shortness of breath.  No cough. CV:  No palpitations, angina.  Chronic, stable swelling in the right lower extremity. GU:  No hematuria, no frequency.  Intermittent dark urine.  No dysuria. GI: See HPI. Heme: Denies excessive or unusual bleeding or bruising. Transfusions: 3 PRBCs in June 2022, 1 PRBC July 2022 Neuro:  No headaches, no peripheral tingling or numbness Derm:  No itching, no rash or sores.  Endocrine:  No sweats or chills.  No polyuria or dysuria Immunization: Viewed. Travel:  None beyond local counties in last few months.    PHYSICAL EXAM: Vital signs in last 24 hours: Vitals:   02/13/21 0900 02/13/21 0925  BP: (!) 101/58 96/83  Pulse: 78 78  Resp: 15 17  Temp:    SpO2:  95%   Wt Readings from Last 3 Encounters:   11/21/20 98.4 kg  11/09/20 91.3 kg  10/09/20 99.8 kg    General: Overweight, comfortable, alert.  Does not look acutely ill.  Resting comfortably on stretcher. Head: No facial asymmetry or swelling.  No signs of head trauma. Eyes: Scleral icterus.  Conjunctiva pink.  EOMI. Ears: Not hard of hearing Nose: No discharge or congestion. Mouth: Missing teeth and dental caries present.  Tongue midline.  Mucosa is moist, pink, clear. Neck: No JVD, no masses, no thyromegaly Lungs: Clear bilaterally.  No cough or labored breathing Heart: RRR.  No MRG.  S1, S2 present Abdomen: Obese, somewhat tight.  No tenderness.  Bowel sounds active.  No hernias.  No organomegaly appreciated..   Rectal: Deferred Musc/Skeltl: Some swelling in the right knee associated with erythema. Extremities: Slight, nonpitting edema in the right lower extremity. Neurologic: Oriented x3.  Good historian.  Moves all 4 limbs without tremor or asterixis.  Strength not tested but grossly normal. Skin: No rash, no sores.  A few telangiectasia on upper chest. Tattoos: None observed Nodes: No cervical adenopathy Psych: Calm, cooperative, pleasant.  Fluid speech.  Intake/Output from previous day: 10/26 0701 - 10/27 0700 In: 1000 [IV Piggyback:1000] Out: -  Intake/Output this shift: No intake/output data recorded.  LAB RESULTS: Recent Labs    02/12/21 1903 02/13/21 0752  WBC 9.8 7.7  HGB 10.2* 9.0*  HCT 29.5* 26.5*  PLT 185 183   BMET Lab Results  Component Value Date   NA 128 (L) 02/13/2021   NA 128 (L) 02/12/2021   NA 123 (L) 11/21/2020   K 3.2 (L) 02/13/2021   K 3.4 (L) 02/12/2021   K 3.6 11/21/2020   CL 98 02/13/2021   CL 99 02/12/2021   CL 94 (L) 11/21/2020   CO2  21 (L) 02/13/2021   CO2 20 (L) 02/12/2021   CO2 24 11/21/2020   GLUCOSE 82 02/13/2021   GLUCOSE 90 02/12/2021   GLUCOSE 80 11/21/2020   BUN 34 (H) 02/13/2021   BUN 35 (H) 02/12/2021   BUN 5 (L) 11/21/2020   CREATININE 1.88 (H) 02/13/2021    CREATININE 2.69 (H) 02/12/2021   CREATININE 0.52 (L) 11/21/2020   CALCIUM 7.9 (L) 02/13/2021   CALCIUM 8.2 (L) 02/12/2021   CALCIUM 7.6 (L) 11/21/2020   LFT Recent Labs    02/12/21 1903 02/13/21 0752  PROT 7.5 6.8  ALBUMIN 2.2* 1.9*  AST 155* 131*  ALT 52* 46*  ALKPHOS 273* 260*  BILITOT 23.7* 21.0*  BILIDIR  --  11.8*  IBILI  --  9.2*   PT/INR Lab Results  Component Value Date   INR 1.5 (H) 02/13/2021   INR 1.4 (H) 02/13/2021   INR 1.1 09/27/2020   Hepatitis Panel Recent Labs    02/13/21 0752  HEPBSAG NON REACTIVE  HCVAB NON REACTIVE  HEPAIGM NON REACTIVE  HEPBIGM NON REACTIVE   C-Diff No components found for: CDIFF Lipase     Component Value Date/Time   LIPASE 98 (H) 02/12/2021 1903    Drugs of Abuse  No results found for: LABOPIA, COCAINSCRNUR, LABBENZ, AMPHETMU, THCU, LABBARB   RADIOLOGY STUDIES: CT ABDOMEN PELVIS WO CONTRAST  Result Date: 02/13/2021 CLINICAL DATA:  Abdominal pain.  Concern for biliary obstruction. EXAM: CT ABDOMEN AND PELVIS WITHOUT CONTRAST TECHNIQUE: Multidetector CT imaging of the abdomen and pelvis was performed following the standard protocol without IV contrast. COMPARISON:  None. FINDINGS: Evaluation of this exam is limited in the absence of intravenous contrast. Lower chest: The visualized lung bases are clear. There is coronary vascular calcification. There is hypoattenuation of the cardiac blood pool suggestive of anemia. Clinical correlation is recommended. No intra-abdominal free air.  Small ascites. Hepatobiliary: Fatty liver with morphologic changes of cirrhosis. No intrahepatic biliary ductal dilatation. Probable sludge within the gallbladder. Pancreas: Unremarkable. No pancreatic ductal dilatation or surrounding inflammatory changes. Spleen: Normal in size without focal abnormality. Adrenals/Urinary Tract: The adrenal glands unremarkable. The kidneys, visualized ureters, and urinary bladder are unremarkable. Stomach/Bowel:  There is no bowel obstruction or active inflammation. No CT evidence of acute appendicitis. Vascular/Lymphatic: Mild aortoiliac atherosclerotic disease. The IVC is unremarkable. No portal gas. There is no adenopathy. Reproductive: The prostate and seminal vesicles are grossly unremarkable. No pelvic mass. Other: None Musculoskeletal: Extensive degenerative changes of the spine. No acute osseous pathology. IMPRESSION: 1. No acute intra-abdominal or pelvic pathology. No bowel obstruction. 2. Fatty liver with morphologic changes of cirrhosis. Small ascites. 3. Aortic Atherosclerosis (ICD10-I70.0). Electronically Signed   By: Anner Crete M.D.   On: 02/13/2021 02:42   US Abdomen Limited RUQ (LIVER/GB)  Result Date: 02/12/2021 CLINICAL DATA:  Jaundice. EXAM: ULTRASOUND ABDOMEN LIMITED RIGHT UPPER QUADRANT COMPARISON:  None. FINDINGS: Gallbladder: There is sludge within the gallbladder. The gallbladder wall is diffusely thickened measuring up to 5 mm. There is a small pericholecystic fluid. This may be related to underlying liver disease, given negative sonographic Murphy's sign. Acute cholecystitis is not entirely excluded. Common bile duct: Diameter: 4 mm Liver: Diffuse increased liver echogenicity with nodular contour consistent with cirrhosis. Portal vein is patent on color Doppler imaging with normal direction of blood flow towards the liver. Other: Small perihepatic sinus. IMPRESSION: 1. Cirrhosis and small ascites. 2. Patent main portal vein with hepatopetal flow. 3. Gallbladder sludge. Diffuse thickened appearance of the gallbladder  wall, likely related to underlying liver disease. Clinical correlation is recommended. Electronically Signed   By: Anner Crete M.D.   On: 02/12/2021 23:32      IMPRESSION:   New diagnosis of cirrhosis in a patient with chronic alcohol abuse.  Stopped drinking last week.  Discriminant function score is 49.  Meld-sodium is 28.  Platelets normal.  Slight elevation INR  1.5.  No signs or history to suggest hepatic encephalopathy.  As of abdominal swelling/bloating, without significant increase in abdominal girth, associated with left-sided abdominal discomfort.  CT scan and ultrasound both confirmed cirrhosis with small ascites.     Right knee and lower extremity septic arthritis, fasciitis with knee I&D and fasciotomy in June/July 2022.     AKI.  Patient uses large doses of ibuprofen on a nearly daily basis to address knee pain.  BUN and creatinine have improved within the last 24 hours.    PLAN:     Agree with low-sodium diet.  Will discuss with Dr. Candis Schatz but probably pursue EGD within the next day or 2.  Labs ordered to rule out autoimmune or metabolic causes for liver disease/cirrhosis.  These include ceruloplasmin, IgG, smooth muscle/mitochondrial antibodies, ANA, Hepatitis B surface Ab, alpha 1 AT.    Not clear there is sufficient ascites for paracentesis but will order this with cell count/differential and albumin levels.  Admitting physician already ordered 6 doses of IV albumin along with folic acid, thiamine.  Follow c-Met, INR and CBC.  Addendum at 1:15 PM: Notified by radiology PA that patient does not have enough ascites for paracentesis.   Azucena Freed  02/13/2021, 10:59 AM Phone [phone]

## 2021-02-13 NOTE — ED Provider Notes (Signed)
[facility] Emergency Department Provider Note MRN:  [id number]  Arrival date & time: 02/13/21     Chief Complaint   Abdominal Pain   History of Present Illness   Kyle Pitts is a 60 y.o. year-old male with a history of cirrhosis presenting to the ED with chief complaint of jaundice.  Worsening jaundice over the past few days.  Also with moderate to severe left upper quadrant abdominal pain.  Pain is constant, described as a burning.  Denies fever, no chest pain or shortness of breath, no lower abdominal pain.  No other complaints, no exacerbating or alleviating factors.  Review of Systems  A complete 10 system review of systems was obtained and all systems are negative except as noted in the HPI and PMH.   Patient's Health History    Past Medical History:  Diagnosis Date   Allergic reaction 11/21/2020   Cirrhosis ([facility])    Hyponatremia    Myofasciitis 11/21/2020   Osteomyelitis of right leg ([facility]) 11/21/2020   Septic arthritis ([facility])    Streptococcus infection, group A 11/21/2020    Past Surgical History:  Procedure Laterality Date   I & D EXTREMITY Right 10/09/2020   Procedure: IRRIGATION AND DEBRIDEMENT EXTREMITY;  Surgeon: Yolonda Kida, MD;  Location: [facility];  Service: Orthopedics;  Laterality: Right;   KNEE ARTHROSCOPY Right 09/27/2020   Procedure: ARTHROSCOPY KNEE, WASH OUT;  Surgeon: Venita Lick, MD;  Location: [facility];  Service: Orthopedics;  Laterality: Right;   KNEE ARTHROSCOPY Right 10/09/2020   Procedure: ARTHROSCOPY KNEE,ARTHROSCOPIC I & D, CALF ABSCESS;  Surgeon: Yolonda Kida, MD;  Location: [facility];  Service: Orthopedics;  Laterality: Right;    Family History  Problem Relation Age of Onset   Hepatitis Mother    Cirrhosis Mother    Stroke Father    Heart failure Father     Social History   Socioeconomic History   Marital status: Divorced    Spouse name: Not on file   Number of children: Not on file   Years of  education: Not on file   Highest education level: Not on file  Occupational History   Not on file  Tobacco Use   Smoking status: Never   Smokeless tobacco: Never  Vaping Use   Vaping Use: Never used  Substance and Sexual Activity   Alcohol use: Yes    Alcohol/week: 24.0 standard drinks    Types: 24 Cans of beer per week    Comment: 3-4 when home from work   Drug use: Not Currently    Types: Cocaine, Marijuana    Comment: last used 1 month ago   Sexual activity: Not Currently  Other Topics Concern   Not on file  Social History Narrative   Not on file   Social Determinants of Health   Financial Resource Strain: Not on file  Food Insecurity: Not on file  Transportation Needs: Not on file  Physical Activity: Not on file  Stress: Not on file  Social Connections: Not on file  Intimate Partner Violence: Not on file     Physical Exam   Vitals:   02/13/21 0148 02/13/21 0300  BP: 124/75 128/81  Pulse: 72 78  Resp: 15 14  Temp: 98.2 F (36.8 C)   SpO2: 100% 100%    CONSTITUTIONAL: Well-appearing, NAD, diffusely jaundiced NEURO:  Alert and oriented x 3, no focal deficits EYES:  eyes equal and reactive ENT/NECK:  no LAD, no JVD CARDIO: Regular rate,  well-perfused, normal S1 and S2 PULM:  CTAB no wheezing or rhonchi GI/GU:  normal bowel sounds, non-distended, non-tender MSK/SPINE:  No gross deformities, no edema SKIN: Healing wound to right shin PSYCH:  Appropriate speech and behavior  *Additional and/or pertinent findings included in MDM below  Diagnostic and Interventional Summary    EKG Interpretation  Date/Time:    Ventricular Rate:    PR Interval:    QRS Duration:   QT Interval:    QTC Calculation:   R Axis:     Text Interpretation:         Labs Reviewed  LIPASE, BLOOD - Abnormal; Notable for the following components:      Result Value   Lipase 98 (*)    All other components within normal limits  COMPREHENSIVE METABOLIC PANEL - Abnormal; Notable  for the following components:   Sodium 128 (*)    Potassium 3.4 (*)    CO2 20 (*)    BUN 35 (*)    Creatinine, Ser 2.69 (*)    Calcium 8.2 (*)    Albumin 2.2 (*)    AST 155 (*)    ALT 52 (*)    Alkaline Phosphatase 273 (*)    Total Bilirubin 23.7 (*)    GFR, Estimated 27 (*)    All other components within normal limits  CBC - Abnormal; Notable for the following components:   RBC 2.84 (*)    Hemoglobin 10.2 (*)    HCT 29.5 (*)    MCV 103.9 (*)    MCH 35.9 (*)    RDW 21.0 (*)    All other components within normal limits  URINALYSIS, ROUTINE W REFLEX MICROSCOPIC - Abnormal; Notable for the following components:   Color, Urine BROWN (*)    APPearance TURBID (*)    Glucose, UA 100 (*)    Hgb urine dipstick SMALL (*)    Protein, ur 100 (*)    Leukocytes,Ua TRACE (*)    All other components within normal limits  URINALYSIS, MICROSCOPIC (REFLEX) - Abnormal; Notable for the following components:   Bacteria, UA RARE (*)    All other components within normal limits  RESP PANEL BY RT-PCR (FLU A&B, COVID) ARPGX2  HEPATITIS PANEL, ACUTE  PROTIME-INR    CT ABDOMEN PELVIS WO CONTRAST  Final Result    US Abdomen Limited RUQ (LIVER/GB)  Final Result      Medications  lactated ringers bolus 1,000 mL (has no administration in time range)  oxyCODONE (Oxy IR/ROXICODONE) immediate release tablet 5 mg (5 mg Oral Given 02/13/21 0239)     Procedures  /  Critical Care .Critical Care Performed by: Sabas Sous, MD Authorized by: Sabas Sous, MD   Critical care provider statement:    Critical care time (minutes):  35   Critical care was necessary to treat or prevent imminent or life-threatening deterioration of the following conditions:  Hepatic failure   Critical care was time spent personally by me on the following activities:  Ordering and performing treatments and interventions, ordering and review of laboratory studies, ordering and review of radiographic studies,  re-evaluation of patient's condition, review of old charts, examination of patient and discussions with consultants  ED Course and Medical Decision Making  I have reviewed the triage vital signs, the nursing notes, and pertinent available records from the EMR.  Listed above are laboratory and imaging tests that I personally ordered, reviewed, and interpreted and then considered in my medical decision making (see below  for details).  Differential diagnosis includes alcoholic cirrhosis, pancreatic mass.  Patient denies any excessive Tylenol use, no ingestion of wild mushrooms.  Has been drinking heavily for the past 4 months.  Ultrasound without acute process, does show evidence of cirrhosis.  Will obtain CT scan to further evaluate the pancreas and patient's left upper quadrant pain.  Patient has AKI and likely a very high meld score (awaiting INR).  Plan is to admit to medicine.       Elmer Sow. Pilar Plate, MD [facility] Emergency Medicine [facility] [email or web address]  Final Clinical Impressions(s) / ED Diagnoses     ICD-10-CM   1. Acute liver failure without hepatic coma  K72.00     2. Jaundice  R17 US Abdomen Limited RUQ (LIVER/GB)    US Abdomen Limited RUQ (LIVER/GB)      ED Discharge Orders     None        Discharge Instructions Discussed with and Provided to Patient:   Discharge Instructions   None       Sabas Sous, MD 02/13/21 [phone]

## 2021-02-13 NOTE — Progress Notes (Addendum)
Subjective: Patient admitted this morning, see detailed H&P by Dr Toniann Fail 60 year old male with history of alcohol use who was admitted in June this year for septic arthritis, underwent surgery also had surgery from a fistula to the right anterior shin came to ED with complaints of epigastric discomfort for past 4 days.  Since last admission for septic arthritis he has been taking ibuprofen for pain in the right knee.  In the ED he was found to have jaundice with elevated bilirubin, with total bilirubin of 23.7.  Abdominal ultrasound showed liver cirrhosis.  Also CT of the right knee showed septic arthritis.  Vitals:   02/13/21 1600 02/13/21 1630  BP: 124/70 125/78  Pulse: 70 62  Resp: (!) 21 14  Temp:    SpO2: 96% 96%      A/P  Jaundice/acute alcoholic hepatitis -Patient has history of alcohol use -Drinks 5-6 beers every day -Total bilirubin elevated to 23.7 -[facility] consulted  Questionable right knee septic arthritis -CT of right knee shows findings consistent with septic arthritis -[facility] consulted -They will see patient for joint aspiration -He does not appear septic -Has not been started on antibiotics -Consider ID consult based on results of synovial fluid evaluation  Acute kidney injury -Prerenal versus ATN from NSAID use -Started on IV normal saline, also given albumin -Follow renal function in a.m. -If creatinine continues to be elevated, consider nephrology consultation  History of alcohol abuse -No signs and symptoms of alcohol withdrawal -Started on CIWA protocol -Continue thiamine  Macrocytic anemia -Likely from alcohol use -Follow anemia panel results  Hypokalemia -Potassium 3.2 -Replace potassium and follow BMP in am  Meredeth Ide [facility] Pager- [phone]

## 2021-02-13 NOTE — Progress Notes (Signed)
New Admission Note:  Arrival Method: stretcher from [facility] Mental Orientation:A & O x4 Telemetry:14 Assessment: Completed Skin: large open wound on R leg, see flow sheet IV:R forearm  Pain:0/10 Safety Measures: Safety Fall Prevention Plan was given, discussed. 36m19: Patient has been orientated to the room, unit and the staff. Family:  updated by  patient   Orders have been reviewed and implemented. Will continue to monitor the patient. Call light has been placed within reach and bed alarm has been activated.

## 2021-02-13 NOTE — ED Notes (Signed)
Pt in bed, pt reports increasing pain, pt requests pain med, iv placed L forearm for blood drawn, scant blood return from iv start, blood drawn via 23 gauge butterfly from L ac, samples obtained and sent to lab.

## 2021-02-13 NOTE — Progress Notes (Signed)
Patient presented to IR for evaluation for possible paracentesis. Limited ultrasound examination of the abdomen did not reveal sufficient fluid for safe percutaneous intervention. Dr. Elby Showers notified.  Findings explained to the patient who verbalized understanding. Please re-consult IR if patient situation warrants repeat evaluation.  Kyle Pitts, [place] [phone] 02/13/2021, 1:57 PM

## 2021-02-13 NOTE — ED Notes (Signed)
Pt to IR

## 2021-02-14 DIAGNOSIS — D649 Anemia, unspecified: Secondary | ICD-10-CM

## 2021-02-14 LAB — COMPREHENSIVE METABOLIC PANEL
ALT: 38 U/L (ref 0–44)
AST: 104 U/L — ABNORMAL HIGH (ref 15–41)
Albumin: 2.6 g/dL — ABNORMAL LOW (ref 3.5–5.0)
Alkaline Phosphatase: 223 U/L — ABNORMAL HIGH (ref 38–126)
Anion gap: 6 (ref 5–15)
BUN: 24 mg/dL — ABNORMAL HIGH (ref 6–20)
CO2: 22 mmol/L (ref 22–32)
Calcium: 8.4 mg/dL — ABNORMAL LOW (ref 8.9–10.3)
Chloride: 103 mmol/L (ref 98–111)
Creatinine, Ser: 1.14 mg/dL (ref 0.61–1.24)
GFR, Estimated: >60 mL/min (ref >60)
Glucose, Bld: 106 mg/dL — ABNORMAL HIGH (ref 70–99)
Potassium: 3.7 mmol/L (ref 3.5–5.1)
Sodium: 131 mmol/L — ABNORMAL LOW (ref 135–145)
Total Bilirubin: 22.3 mg/dL (ref 0.3–1.2)
Total Protein: 7 g/dL (ref 6.5–8.1)

## 2021-02-14 LAB — PROTIME-INR
INR: 1.4 — ABNORMAL HIGH (ref 0.8–1.2)
Prothrombin Time: 17.4 seconds — ABNORMAL HIGH (ref 11.4–15.2)

## 2021-02-14 LAB — CBC
HCT: 24.6 % — ABNORMAL LOW (ref 39.0–52.0)
Hemoglobin: 8.7 g/dL — ABNORMAL LOW (ref 13.0–17.0)
MCH: 36 pg — ABNORMAL HIGH (ref 26.0–34.0)
MCHC: 35.4 g/dL (ref 30.0–36.0)
MCV: 101.7 fL — ABNORMAL HIGH (ref 80.0–100.0)
Platelets: 176 10*3/uL (ref 150–400)
RBC: 2.42 MIL/uL — ABNORMAL LOW (ref 4.22–5.81)
RDW: 20.8 % — ABNORMAL HIGH (ref 11.5–15.5)
WBC: 7.1 10*3/uL (ref 4.0–10.5)
nRBC: 0 % (ref 0.0–0.2)

## 2021-02-14 LAB — HEPATITIS B SURFACE ANTIBODY,QUALITATIVE: Hep B S Ab: NONREACTIVE

## 2021-02-14 MED ORDER — SODIUM CHLORIDE 0.9 % IV SOLN: INTRAVENOUS | Status: AC

## 2021-02-14 NOTE — H&P (View-Only) (Signed)
[facility] ROUNDING NOTE   Subjective: No acute events overnight.  Patient underwent arthrocentesis to ensure no persistent joint infection.  His renal function greatly improved with IVF/albumin.  Hgb has drifted to 8.7. Patient is feeling well this morning.  Good appetite, no more abdominal discomfort.  Had a bowel movement yesterday that was not as dark as they have been recently.   Objective: Vital signs in last 24 hours: Temp:  [98 F (36.7 C)-98.3 F (36.8 C)] 98.2 F (36.8 C) (10/28 0743) Pulse Rate:  [62-91] 77 (10/28 0743) Resp:  [12-21] 18 (10/28 0743) BP: (106-134)/(56-96) 134/79 (10/28 0743) SpO2:  [89 %-100 %] 97 % (10/28 0743) Last BM Date: 02/13/21 General: NAD, pleasant Caucasian male, jaundiced Lungs:  CTA b/l, no w/r/r Heart:  RRR, no m/r/g Abdomen:  Soft, NT, ND, +BS Ext:  R knee with bandage over aspiration site, R tibial wound dressed, R leg warmed than left    Intake/Output from previous day: 10/27 0701 - 10/28 0700 In: 1290.2 [P.O.:120; I.V.:1012.7; IV Piggyback:157.5] Out: 0  Intake/Output this shift: Total I/O In: 720 [P.O.:720] Out: -    Lab Results: Recent Labs    02/12/21 1903 02/13/21 0752 02/14/21 0150  WBC 9.8 7.7 7.1  HGB 10.2* 9.0* 8.7*  PLT 185 183 176  MCV 103.9* 103.9* 101.7*   BMET Recent Labs    02/12/21 1903 02/13/21 0752 02/14/21 0150  NA 128* 128* 131*  K 3.4* 3.2* 3.7  CL 99 98 103  CO2 20* 21* 22  GLUCOSE 90 82 106*  BUN 35* 34* 24*  CREATININE 2.69* 1.88* 1.14  CALCIUM 8.2* 7.9* 8.4*   LFT Recent Labs    02/12/21 1903 02/13/21 0752 02/14/21 0150  PROT 7.5 6.8 7.0  ALBUMIN 2.2* 1.9* 2.6*  AST 155* 131* 104*  ALT 52* 46* 38  ALKPHOS 273* 260* 223*  BILITOT 23.7* 21.0* 22.3*  BILIDIR  --  11.8*  --   IBILI  --  9.2*  --    PT/INR Recent Labs    02/13/21 0752 02/14/21 0150  INR 1.5* 1.4*      Imaging/Other results: CT ABDOMEN PELVIS WO CONTRAST  Result Date:  02/13/2021 CLINICAL DATA:  Abdominal pain.  Concern for biliary obstruction. EXAM: CT ABDOMEN AND PELVIS WITHOUT CONTRAST TECHNIQUE: Multidetector CT imaging of the abdomen and pelvis was performed following the standard protocol without IV contrast. COMPARISON:  None. FINDINGS: Evaluation of this exam is limited in the absence of intravenous contrast. Lower chest: The visualized lung bases are clear. There is coronary vascular calcification. There is hypoattenuation of the cardiac blood pool suggestive of anemia. Clinical correlation is recommended. No intra-abdominal free air.  Small ascites. Hepatobiliary: Fatty liver with morphologic changes of cirrhosis. No intrahepatic biliary ductal dilatation. Probable sludge within the gallbladder. Pancreas: Unremarkable. No pancreatic ductal dilatation or surrounding inflammatory changes. Spleen: Normal in size without focal abnormality. Adrenals/Urinary Tract: The adrenal glands unremarkable. The kidneys, visualized ureters, and urinary bladder are unremarkable. Stomach/Bowel: There is no bowel obstruction or active inflammation. No CT evidence of acute appendicitis. Vascular/Lymphatic: Mild aortoiliac atherosclerotic disease. The IVC is unremarkable. No portal gas. There is no adenopathy. Reproductive: The prostate and seminal vesicles are grossly unremarkable. No pelvic mass. Other: None Musculoskeletal: Extensive degenerative changes of the spine. No acute osseous pathology. IMPRESSION: 1. No acute intra-abdominal or pelvic pathology. No bowel obstruction. 2. Fatty liver with morphologic changes of cirrhosis. Small ascites. 3. Aortic Atherosclerosis (ICD10-I70.0). Electronically Signed   By: Burtis Junes  Radparvar M.D.   On: 02/13/2021 02:42   CT KNEE RIGHT WO CONTRAST  Result Date: 02/13/2021 CLINICAL DATA:  Knee instability, prior surgery on right knee due to osteomyelitis EXAM: CT OF THE right KNEE WITHOUT CONTRAST TECHNIQUE: Multidetector CT imaging of the right  knee was performed according to the standard protocol. Multiplanar CT image reconstructions were also generated. COMPARISON:  Partially visualized on prior MRI in June 2022, radiograph 09/27/2020 FINDINGS: Bones/Joint/Cartilage There is tricompartment osteophyte formation, subchondral sclerosis, and cystic change. There is severe medial and lateral joint space narrowing, which appears progressed since the prior radiograph. There is marked articular surface irregularity in the medial and lateral compartments. There is articular surface collapse measuring up to 6 mm deep and 2.7 x 1.8 cm wide along the periphery of the lateral tibial plateau (coronal image 41, sagittal image 31). There is subchondral lucency along the lateral femoral condyle anteriorly. There is the appearance of focal bony erosion at the far posterolateral aspect of the lateral tibial plateau (sagittal image 8, coronal image 55, and similar erosive change at the anterior aspect of the fibular head at the tibiofibular joint. These osseous changes are not clearly seen on prior MRI, though the knee is partially imaged on that exam. There is a small to moderate size knee joint effusion, which appears somewhat heterogeneous. Ligaments Suboptimally assessed by CT. Muscles and Tendons No muscle atrophy. An ovoid collection just posterior to the fibula which appears to course between the popliteus and soleus muscles measuring up to 2.8 x 2.2 x 4.2 cm (series 4, image 53, coronal image 62). Soft tissues There is diffuse soft tissue swelling along the knee. IMPRESSION: Findings are suspicious for ongoing septic arthritis of the right knee. Recommend knee MRI and joint aspiration. Bones: Severe progressive arthritis with marked articular surface irregularity, erosive changes at the far posterolateral aspect of the lateral tibial plateau and fibular head at the tibiofibular joint. New depressed lateral tibial plateau fracture with 6 mm articular surface  depression, which could potentially be pathologic due to underlying infection. Fluid/Soft tissues: Small to moderate-sized joint effusion which appears heterogeneous. Possible fluid collection extending from the posterolateral knee joint inferiorly between the popliteus and soleus muscles which measures 2.8 x 2.2 x 4.2 cm. Electronically Signed   By: Caprice Renshaw M.D.   On: 02/13/2021 08:48   IR ABDOMEN US LIMITED  Result Date: 02/14/2021 CLINICAL DATA:  60 year old male presenting for paracentesis. EXAM: LIMITED ABDOMEN ULTRASOUND FOR ASCITES TECHNIQUE: Limited ultrasound survey for ascites was performed in all four abdominal quadrants. COMPARISON:  CT abdomen pelvis from 02/13/2021 FINDINGS: Trace lower abdominal ascites.  No safe window for paracentesis. IMPRESSION: Trace ascites.  No paracentesis performed. Marliss Coots, MD [facility] Electronically Signed   By: Marliss Coots M.D.   On: 02/14/2021 07:24   US Abdomen Limited RUQ (LIVER/GB)  Result Date: 02/12/2021 CLINICAL DATA:  Jaundice. EXAM: ULTRASOUND ABDOMEN LIMITED RIGHT UPPER QUADRANT COMPARISON:  None. FINDINGS: Gallbladder: There is sludge within the gallbladder. The gallbladder wall is diffusely thickened measuring up to 5 mm. There is a small pericholecystic fluid. This may be related to underlying liver disease, given negative sonographic Murphy's sign. Acute cholecystitis is not entirely excluded. Common bile duct: Diameter: 4 mm Liver: Diffuse increased liver echogenicity with nodular contour consistent with cirrhosis. Portal vein is patent on color Doppler imaging with normal direction of blood flow towards the liver. Other: Small perihepatic sinus. IMPRESSION: 1. Cirrhosis and small ascites. 2.  Patent main portal vein with hepatopetal flow. 3. Gallbladder sludge. Diffuse thickened appearance of the gallbladder wall, likely related to underlying liver disease. Clinical  correlation is recommended. Electronically Signed   By: Elgie Collard M.D.   On: 02/12/2021 23:32      Assessment and Plan: 60 year old male with history of heavy alcohol use and presumed cirrhosis without official diagnosis, admitted with several days of generalized abdominal discomfort and new oral icterus and jaundice.  He was found to have a bilirubin of 21, AST/ALT 131/46, and INR of 1.5.  Also found to have acute kidney injury with creatinine of 2.6 .  Imaging consistent with a cirrhotic appearing liver with small ascites, otherwise no obvious evidence of portal hypertension.  Patient states that he had been drinking a significant amount lately (6 beers per day +1/5 of vodka every 2 days).  He told me his last drink was over a week ago. The patient denies symptoms of nausea, vomiting, malaise or fevers.  No leukocytosis. Other than appearing jaundiced, the patient looks well and is not feeling ill.  His presentation seems more consistent with decompensated cirrhosis as opposed to acute alcoholic hepatitis.  I suspect his liver is decompensating in the setting ongoing alcohol use and systemic insults through his infectious orthopedic issues.     Alcoholic cirrhosis - Although labs suggestive of it given his very high bilirubin, his clinical presentation is not very consistent with acute alcoholic hepatitis (no nausea, RUQ tenderness, fevers, malaise, anorexia) so I would not recommend starting prednisolone at this time, especially in setting of possible infection - Will rule out other concomitant causes of chronic liver disease (viral, autoimmune, metabolic) - Attempted paracentesis unsuccessful due insufficient ascites - Low salt diet - Patient states he is committed to alcohol cessation - We discussed the role of liver transplant in cirrhosis.  Currently, he would not be a candidate given recent alcohol use. He is aware that most transplant programs require 6 months of abstinence and often  require completion of formal alcohol cessation programs. -  EGD tomorrow to screen for varices and to assess for causes of dwindling hbg/dark stools   Anemia/dark stools - Hgb drifted further today, although patient states his stool appeared more normal in color last night - Will perform EGD tomorrow to further evaluate  Acute kidney injury, pre-renal - Resolved with IVF/albumin    Jenel Lucks, MD  02/14/2021, 9:33 AM [facility]

## 2021-02-14 NOTE — Progress Notes (Signed)
[facility] ROUNDING NOTE   Subjective: No acute events overnight.  Patient underwent arthrocentesis to ensure no persistent joint infection.  His renal function greatly improved with IVF/albumin.  Hgb has drifted to 8.7. Patient is feeling well this morning.  Good appetite, no more abdominal discomfort.  Had a bowel movement yesterday that was not as dark as they have been recently.   Objective: Vital signs in last 24 hours: Temp:  [98 F (36.7 C)-98.3 F (36.8 C)] 98.2 F (36.8 C) (10/28 0743) Pulse Rate:  [62-91] 77 (10/28 0743) Resp:  [12-21] 18 (10/28 0743) BP: (106-134)/(56-96) 134/79 (10/28 0743) SpO2:  [89 %-100 %] 97 % (10/28 0743) Last BM Date: 02/13/21 General: NAD, pleasant Caucasian male, jaundiced Lungs:  CTA b/l, no w/r/r Heart:  RRR, no m/r/g Abdomen:  Soft, NT, ND, +BS Ext:  R knee with bandage over aspiration site, R tibial wound dressed, R leg warmed than left    Intake/Output from previous day: 10/27 0701 - 10/28 0700 In: 1290.2 [P.O.:120; I.V.:1012.7; IV Piggyback:157.5] Out: 0  Intake/Output this shift: Total I/O In: 720 [P.O.:720] Out: -    Lab Results: Recent Labs    02/12/21 1903 02/13/21 0752 02/14/21 0150  WBC 9.8 7.7 7.1  HGB 10.2* 9.0* 8.7*  PLT 185 183 176  MCV 103.9* 103.9* 101.7*   BMET Recent Labs    02/12/21 1903 02/13/21 0752 02/14/21 0150  NA 128* 128* 131*  K 3.4* 3.2* 3.7  CL 99 98 103  CO2 20* 21* 22  GLUCOSE 90 82 106*  BUN 35* 34* 24*  CREATININE 2.69* 1.88* 1.14  CALCIUM 8.2* 7.9* 8.4*   LFT Recent Labs    02/12/21 1903 02/13/21 0752 02/14/21 0150  PROT 7.5 6.8 7.0  ALBUMIN 2.2* 1.9* 2.6*  AST 155* 131* 104*  ALT 52* 46* 38  ALKPHOS 273* 260* 223*  BILITOT 23.7* 21.0* 22.3*  BILIDIR  --  11.8*  --   IBILI  --  9.2*  --    PT/INR Recent Labs    02/13/21 0752 02/14/21 0150  INR 1.5* 1.4*      Imaging/Other results: CT ABDOMEN PELVIS WO CONTRAST  Result Date:  02/13/2021 CLINICAL DATA:  Abdominal pain.  Concern for biliary obstruction. EXAM: CT ABDOMEN AND PELVIS WITHOUT CONTRAST TECHNIQUE: Multidetector CT imaging of the abdomen and pelvis was performed following the standard protocol without IV contrast. COMPARISON:  None. FINDINGS: Evaluation of this exam is limited in the absence of intravenous contrast. Lower chest: The visualized lung bases are clear. There is coronary vascular calcification. There is hypoattenuation of the cardiac blood pool suggestive of anemia. Clinical correlation is recommended. No intra-abdominal free air.  Small ascites. Hepatobiliary: Fatty liver with morphologic changes of cirrhosis. No intrahepatic biliary ductal dilatation. Probable sludge within the gallbladder. Pancreas: Unremarkable. No pancreatic ductal dilatation or surrounding inflammatory changes. Spleen: Normal in size without focal abnormality. Adrenals/Urinary Tract: The adrenal glands unremarkable. The kidneys, visualized ureters, and urinary bladder are unremarkable. Stomach/Bowel: There is no bowel obstruction or active inflammation. No CT evidence of acute appendicitis. Vascular/Lymphatic: Mild aortoiliac atherosclerotic disease. The IVC is unremarkable. No portal gas. There is no adenopathy. Reproductive: The prostate and seminal vesicles are grossly unremarkable. No pelvic mass. Other: None Musculoskeletal: Extensive degenerative changes of the spine. No acute osseous pathology. IMPRESSION: 1. No acute intra-abdominal or pelvic pathology. No bowel obstruction. 2. Fatty liver with morphologic changes of cirrhosis. Small ascites. 3. Aortic Atherosclerosis (ICD10-I70.0). Electronically Signed   By: Burtis Junes  Radparvar M.D.   On: 02/13/2021 02:42   CT KNEE RIGHT WO CONTRAST  Result Date: 02/13/2021 CLINICAL DATA:  Knee instability, prior surgery on right knee due to osteomyelitis EXAM: CT OF THE right KNEE WITHOUT CONTRAST TECHNIQUE: Multidetector CT imaging of the right  knee was performed according to the standard protocol. Multiplanar CT image reconstructions were also generated. COMPARISON:  Partially visualized on prior MRI in June 2022, radiograph 09/27/2020 FINDINGS: Bones/Joint/Cartilage There is tricompartment osteophyte formation, subchondral sclerosis, and cystic change. There is severe medial and lateral joint space narrowing, which appears progressed since the prior radiograph. There is marked articular surface irregularity in the medial and lateral compartments. There is articular surface collapse measuring up to 6 mm deep and 2.7 x 1.8 cm wide along the periphery of the lateral tibial plateau (coronal image 41, sagittal image 31). There is subchondral lucency along the lateral femoral condyle anteriorly. There is the appearance of focal bony erosion at the far posterolateral aspect of the lateral tibial plateau (sagittal image 8, coronal image 55, and similar erosive change at the anterior aspect of the fibular head at the tibiofibular joint. These osseous changes are not clearly seen on prior MRI, though the knee is partially imaged on that exam. There is a small to moderate size knee joint effusion, which appears somewhat heterogeneous. Ligaments Suboptimally assessed by CT. Muscles and Tendons No muscle atrophy. An ovoid collection just posterior to the fibula which appears to course between the popliteus and soleus muscles measuring up to 2.8 x 2.2 x 4.2 cm (series 4, image 53, coronal image 62). Soft tissues There is diffuse soft tissue swelling along the knee. IMPRESSION: Findings are suspicious for ongoing septic arthritis of the right knee. Recommend knee MRI and joint aspiration. Bones: Severe progressive arthritis with marked articular surface irregularity, erosive changes at the far posterolateral aspect of the lateral tibial plateau and fibular head at the tibiofibular joint. New depressed lateral tibial plateau fracture with 6 mm articular surface  depression, which could potentially be pathologic due to underlying infection. Fluid/Soft tissues: Small to moderate-sized joint effusion which appears heterogeneous. Possible fluid collection extending from the posterolateral knee joint inferiorly between the popliteus and soleus muscles which measures 2.8 x 2.2 x 4.2 cm. Electronically Signed   By: Jacob  Kahn M.D.   On: 02/13/2021 08:48   IR ABDOMEN US LIMITED  Result Date: 02/14/2021 CLINICAL DATA:  59-year-old male presenting for paracentesis. EXAM: LIMITED ABDOMEN ULTRASOUND FOR ASCITES TECHNIQUE: Limited ultrasound survey for ascites was performed in all four abdominal quadrants. COMPARISON:  CT abdomen pelvis from 02/13/2021 FINDINGS: Trace lower abdominal ascites.  No safe window for paracentesis. IMPRESSION: Trace ascites.  No paracentesis performed. Dylan Suttle, MD [facility] Electronically Signed   By: Dylan  Suttle M.D.   On: 02/14/2021 07:24   US Abdomen Limited RUQ (LIVER/GB)  Result Date: 02/12/2021 CLINICAL DATA:  Jaundice. EXAM: ULTRASOUND ABDOMEN LIMITED RIGHT UPPER QUADRANT COMPARISON:  None. FINDINGS: Gallbladder: There is sludge within the gallbladder. The gallbladder wall is diffusely thickened measuring up to 5 mm. There is a small pericholecystic fluid. This may be related to underlying liver disease, given negative sonographic Murphy's sign. Acute cholecystitis is not entirely excluded. Common bile duct: Diameter: 4 mm Liver: Diffuse increased liver echogenicity with nodular contour consistent with cirrhosis. Portal vein is patent on color Doppler imaging with normal direction of blood flow towards the liver. Other: Small perihepatic sinus. IMPRESSION: 1. Cirrhosis and small ascites. 2.   Patent main portal vein with hepatopetal flow. 3. Gallbladder sludge. Diffuse thickened appearance of the gallbladder wall, likely related to underlying liver disease. Clinical  correlation is recommended. Electronically Signed   By: Arash  Radparvar M.D.   On: 02/12/2021 23:32      Assessment and Plan: 59-year-old male with history of heavy alcohol use and presumed cirrhosis without official diagnosis, admitted with several days of generalized abdominal discomfort and new oral icterus and jaundice.  He was found to have a bilirubin of 21, AST/ALT 131/46, and INR of 1.5.  Also found to have acute kidney injury with creatinine of 2.6 .  Imaging consistent with a cirrhotic appearing liver with small ascites, otherwise no obvious evidence of portal hypertension.  Patient states that he had been drinking a significant amount lately (6 beers per day +1/5 of vodka every 2 days).  He told me his last drink was over a week ago. The patient denies symptoms of nausea, vomiting, malaise or fevers.  No leukocytosis. Other than appearing jaundiced, the patient looks well and is not feeling ill.  His presentation seems more consistent with decompensated cirrhosis as opposed to acute alcoholic hepatitis.  I suspect his liver is decompensating in the setting ongoing alcohol use and systemic insults through his infectious orthopedic issues.     Alcoholic cirrhosis - Although labs suggestive of it given his very high bilirubin, his clinical presentation is not very consistent with acute alcoholic hepatitis (no nausea, RUQ tenderness, fevers, malaise, anorexia) so I would not recommend starting prednisolone at this time, especially in setting of possible infection - Will rule out other concomitant causes of chronic liver disease (viral, autoimmune, metabolic) - Attempted paracentesis unsuccessful due insufficient ascites - Low salt diet - Patient states he is committed to alcohol cessation - We discussed the role of liver transplant in cirrhosis.  Currently, he would not be a candidate given recent alcohol use. He is aware that most transplant programs require 6 months of abstinence and often  require completion of formal alcohol cessation programs. -  EGD tomorrow to screen for varices and to assess for causes of dwindling hbg/dark stools   Anemia/dark stools - Hgb drifted further today, although patient states his stool appeared more normal in color last night - Will perform EGD tomorrow to further evaluate  Acute kidney injury, pre-renal - Resolved with IVF/albumin    Oshua Mcconaha E Ginia Rudell, MD  02/14/2021, 9:33 AM [facility]  

## 2021-02-14 NOTE — TOC CAGE-AID Note (Signed)
Transition of Care [facility]) - CAGE-AID Screening   Patient Details  Name: Kyle Pitts MRN: [id number] Date of Birth: August 11, 1960  [facility] CM/SW Contact:    Tom-Johnson, Hershal Coria, RN Phone Number: 02/14/2021, 4:47 PM   Clinical Narrative: CM spoke with patient at bedside about substance abuse. Patient endorsed drinking 4-6 beers a day. Willing to quit. Resources given to patient and online information placed on AVS.    CAGE-AID Screening:    Have You Ever Felt You Ought to Cut Down on Your Drinking or Drug Use?: Yes Have People Annoyed You By Critizing Your Drinking Or Drug Use?: Yes Have You Felt Bad Or Guilty About Your Drinking Or Drug Use?: Yes Have You Ever Had a Drink or Used Drugs First Thing In The Morning to Steady Your Nerves or to Get Rid of a Hangover?: No CAGE-AID Score: 3  Substance Abuse Education Offered: Yes  Substance abuse interventions: Transport planner, Patient Counseling

## 2021-02-14 NOTE — Progress Notes (Signed)
PROGRESS NOTE    Kyle Pitts  NIO:[id number] DOB: Oct 25, 1960 DOA: 02/12/2021 PCP: [name], MD   Chief Complaint  Patient presents with   Abdominal Pain   Brief Narrative/Hospital Course:  Kyle Pitts, 60 y.o. 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  She reports taking ibuprofen since her admission for septic arthritis for the pain in the right knee.  Patient reports she was having left-sided abdominal pain and noticed yellowish discoloration of the skin in eyes for last 5 days. He does endorse he drinks alcohol every day at least 6 beers daily In the ED patient was found to have significantly elevated LFTs, AKI. CT of the right knee showed possible septic arthritis.  GI and orthopedic was consulted and patient was admitted   Subjective: Seen and examined this morning.  Has no complaints.  Right knee pain stable Came in with 5 days of left sided abd pain and with yellow discoloration of skin and eyes. Today no pain no nausea or vomiting, no diarrhea  Assessment & Plan:  Jaundice/hepatitis/Alcoholic cirrhosis:  question alcohol induced versus other etiology GI following closely.  Per GI less likely alcohol induced, monitoring without prednisone.  Suspecting clinical presentation of alcoholic cirrhosis.  Rule out other etiology.  Fortunately AST ALT improving but total bili is significantly up 22.ontinue on low-salt diet, underwent attempted paracentesis but unsuccessful due to insufficient ascites, GI discussed role of liver transplant and cirrhosis but not a candidate given recent alcohol use and will need to be off for at least 6 months.  Plan for EGD tomorrow for varices.  Continue supportive care. Recent Labs  Lab 02/12/21 1903 02/13/21 0440 02/13/21 0752 02/14/21 0150  AST 155*  --  131* 104*  ALT 52*  --  46* 38  ALKPHOS 273*  --   260* 223*  BILITOT 23.7*  --  21.0* 22.3*  PROT 7.5  --  6.8 7.0  ALBUMIN 2.2*  --  1.9* 2.6*  INR  --  1.4* 1.5* 1.4*    Anemia/dark stool: Hemoglobin is drifting.  Noted EGD planned for tomorrow.  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 Last etoh on Saturday.  Watch for withdrawal continue thiamine folate and CIWA scale Ativan  Recent right knee septic arthritis in June completed antibiotics.  Aspiration done by ID currently no evidence of septic arthritis continue pain control.  Follow-up further orthopedic recommendation  Hypokalemia monitor and replete Hyponatremia likely from cirrhosis.  Monitor  Obesity:Patient's There is no height or weight on file to calculate BMI. :  Check BMI   DVT prophylaxis: SCDs Start: 02/13/21 0615 NO chemical prophylaxis due to cirrhosis anemia Code Status:   Code Status: Full Code Family Communication: plan of care discussed with patient at bedside. Status is: Inpatient  Remains inpatient appropriate because: For ongoing management of liver cirrhosis, right knee pain and anemia   Objective: Vitals last 24 hrs: Vitals:  02/13/21 1848 02/13/21 2059 02/14/21 0455 02/14/21 0743  BP: 111/68 129/82 109/67 134/79  Pulse: 74 77 69 77  Resp: 16 20 20 18   Temp: 98 F (36.7 C) 98.3 F (36.8 C) 98.1 F (36.7 C) 98.2 F (36.8 C)  TempSrc: Oral Oral Oral Oral  SpO2: 98% 100% 98% 97%   Weight change:   Intake/Output Summary (Last 24 hours) at 02/14/2021 02/16/2021 Last data filed at 02/14/2021 0847 Gross per 24 hour  Intake 2010.19 ml  Output 0 ml  Net 2010.19 ml   Net IO Since Admission:  3,010.19 mL [02/14/21 0937]   Physical Examination: General exam: Aa0x3, obese,, weak,older than stated age. HEENT:Oral mucosa moist, Ear/Nose WNL grossly,dentition normal. Respiratory system: B/l diminished BS, no use of accessory muscle, non tender. Cardiovascular system: S1 & S2 +,No JVD. Gastrointestinal system: Abdomen soft, NT,ND, BS+. Nervous System:Alert, awake, moving extremities. Extremities: edema none, distal peripheral pulses palpable.  Skin: No rashes, no icterus. MSK: Normal muscle bulk, tone, power.  Medications reviewed:  Scheduled Meds:  folic acid  1 mg Oral Daily   multivitamin with minerals  1 tablet Oral Daily   thiamine  100 mg Oral Daily   Or   thiamine  100 mg Intravenous Daily   Continuous Infusions:  albumin human 25 g (02/14/21 0848)   Diet Order             Diet NPO time specified Except for: Sips with Meds  Diet effective midnight           Diet 2 gram sodium Room service appropriate? Yes; Fluid consistency: Thin  Diet effective now                          Weight change:   Wt Readings from Last 3 Encounters:  11/21/20 98.4 kg  11/09/20 91.3 kg  10/09/20 99.8 kg     Consultants:see note  Procedures:see note Antimicrobials: Anti-infectives (From admission, onward)    None      Culture/Microbiology    Component Value Date/Time   SDES FLUID RIGHT KNEE 02/13/2021 1732   SPECREQUEST NONE 02/13/2021 1732   CULT  02/13/2021 1732    NO GROWTH < 24 HOURS Performed at [facility], [address], [place], [place]    REPTSTATUS PENDING 02/13/2021 1732    Other culture-see note  Unresulted Labs (From admission, onward)     Start     Ordered   02/14/21 0500  ANA, IFA (with reflex)  Tomorrow morning,   R        02/13/21 1214   02/14/21 0500  Mitochondrial antibodies  (Mitochondiral/Smooth Muscle AB PNL (PNL))  Tomorrow morning,   R        02/13/21 1214   02/14/21 0500  Anti-smooth muscle antibody, IgG   (Mitochondiral/Smooth Muscle AB PNL (PNL))  Tomorrow morning,   R        02/13/21 1214   02/14/21 0500  IgG  Tomorrow morning,   R        02/13/21 1214   02/14/21 0500  Ceruloplasmin  Tomorrow morning,   R        02/13/21 1214   02/14/21 0500  Hepatitis B surface antibody,qualitative  Tomorrow morning,   R        02/13/21 1214   02/14/21 0500  Alpha-1-antitrypsin  Tomorrow morning,   R        02/13/21 1214  Data Reviewed: I have personally reviewed following labs and imaging studies CBC: Recent Labs  Lab 02/12/21 1903 02/13/21 0752 02/14/21 0150  WBC 9.8 7.7 7.1  NEUTROABS  --  4.8  --   HGB 10.2* 9.0* 8.7*  HCT 29.5* 26.5* 24.6*  MCV 103.9* 103.9* 101.7*  PLT 185 183 176   Basic Metabolic Panel: Recent Labs  Lab 02/12/21 1903 02/13/21 0752 02/14/21 0150  NA 128* 128* 131*  K 3.4* 3.2* 3.7  CL 99 98 103  CO2 20* 21* 22  GLUCOSE 90 82 106*  BUN 35* 34* 24*  CREATININE 2.69* 1.88* 1.14  CALCIUM 8.2* 7.9* 8.4*   GFR: CrCl cannot be calculated (Unknown ideal weight.). Liver Function Tests: Recent Labs  Lab 02/12/21 1903 02/13/21 0752 02/14/21 0150  AST 155* 131* 104*  ALT 52* 46* 38  ALKPHOS 273* 260* 223*  BILITOT 23.7* 21.0* 22.3*  PROT 7.5 6.8 7.0  ALBUMIN 2.2* 1.9* 2.6*   Recent Labs  Lab 02/12/21 1903  LIPASE 98*   No results for input(s): AMMONIA in the last 168 hours. Coagulation Profile: Recent Labs  Lab 02/13/21 0440 02/13/21 0752 02/14/21 0150  INR 1.4* 1.5* 1.4*   Cardiac Enzymes: No results for input(s): CKTOTAL, CKMB, CKMBINDEX, TROPONINI in the last 168 hours. BNP (last 3 results) No results for input(s): PROBNP in the last 8760 hours. HbA1C: No results for input(s): HGBA1C in the last 72 hours. CBG: No results for input(s): GLUCAP in the last 168 hours. Lipid Profile: No results for input(s): CHOL, HDL, LDLCALC, TRIG, CHOLHDL, LDLDIRECT in the last 72 hours. Thyroid Function Tests: No results for input(s): TSH,  T4TOTAL, FREET4, T3FREE, THYROIDAB in the last 72 hours. Anemia Panel: No results for input(s): VITAMINB12, FOLATE, FERRITIN, TIBC, IRON, RETICCTPCT in the last 72 hours. Sepsis Labs: No results for input(s): PROCALCITON, LATICACIDVEN in the last 168 hours.  Recent Results (from the past 240 hour(s))  Resp Panel by RT-PCR (Flu A&B, Covid) Nasopharyngeal Swab     Status: None   Collection Time: 02/13/21  3:43 AM   Specimen: Nasopharyngeal Swab; Nasopharyngeal(NP) swabs in vial transport medium  Result Value Ref Range Status   SARS Coronavirus 2 by RT PCR NEGATIVE NEGATIVE Final    Comment: (NOTE) SARS-CoV-2 target nucleic acids are NOT DETECTED.  The SARS-CoV-2 RNA is generally detectable in upper respiratory specimens during the acute phase of infection. The lowest concentration of SARS-CoV-2 viral copies this assay can detect is 138 copies/mL. A negative result does not preclude SARS-Cov-2 infection and should not be used as the sole basis for treatment or other patient management decisions. A negative result may occur with  improper specimen collection/handling, submission of specimen other than nasopharyngeal swab, presence of viral mutation(s) within the areas targeted by this assay, and inadequate number of viral copies(<138 copies/mL). A negative result must be combined with clinical observations, patient history, and epidemiological information. The expected result is Negative.  Fact Sheet for Patients:  BloggerCourse.com  Fact Sheet for Healthcare Providers:  SeriousBroker.it  This test is no t yet approved or cleared by the [place] FDA and  has been authorized for detection and/or diagnosis of SARS-CoV-2 by FDA under an Emergency Use Authorization (EUA). This EUA will remain  in effect (meaning this test can be used) for the duration of the COVID-19 declaration under Section 564(b)(1) of the Act, 21 U.S.C.section  360bbb-3(b)(1), unless the authorization is terminated  or revoked sooner.       Influenza A by  PCR NEGATIVE NEGATIVE Final   Influenza B by PCR NEGATIVE NEGATIVE Final    Comment: (NOTE) The Xpert Xpress SARS-CoV-2/FLU/RSV plus assay is intended as an aid in the diagnosis of influenza from Nasopharyngeal swab specimens and should not be used as a sole basis for treatment. Nasal washings and aspirates are unacceptable for Xpert Xpress SARS-CoV-2/FLU/RSV testing.  Fact Sheet for Patients: BloggerCourse.com  Fact Sheet for Healthcare Providers: SeriousBroker.it  This test is not yet approved or cleared by the [place] FDA and has been authorized for detection and/or diagnosis of SARS-CoV-2 by FDA under an Emergency Use Authorization (EUA). This EUA will remain in effect (meaning this test can be used) for the duration of the COVID-19 declaration under Section 564(b)(1) of the Act, 21 U.S.C. section 360bbb-3(b)(1), unless the authorization is terminated or revoked.  Performed at [facility], [address], [place], [place] [address]   Body fluid culture w Gram Stain     Status: None (Preliminary result)   Collection Time: 02/13/21  5:32 PM   Specimen: Body Fluid  Result Value Ref Range Status   Specimen Description FLUID RIGHT KNEE  Final   Special Requests NONE  Final   Gram Stain   Final    MODERATE WBC PRESENT, PREDOMINANTLY MONONUCLEAR NO ORGANISMS SEEN    Culture   Final    NO GROWTH < 24 HOURS Performed at [facility], [address] [place], [place] [address]    Report Status PENDING  Incomplete     Radiology Studies: CT ABDOMEN PELVIS WO CONTRAST  Result Date: 02/13/2021 CLINICAL DATA:  Abdominal pain.  Concern for biliary obstruction. EXAM: CT ABDOMEN AND PELVIS WITHOUT CONTRAST TECHNIQUE: Multidetector CT imaging of the abdomen and pelvis was performed following the standard protocol  without IV contrast. COMPARISON:  None. FINDINGS: Evaluation of this exam is limited in the absence of intravenous contrast. Lower chest: The visualized lung bases are clear. There is coronary vascular calcification. There is hypoattenuation of the cardiac blood pool suggestive of anemia. Clinical correlation is recommended. No intra-abdominal free air.  Small ascites. Hepatobiliary: Fatty liver with morphologic changes of cirrhosis. No intrahepatic biliary ductal dilatation. Probable sludge within the gallbladder. Pancreas: Unremarkable. No pancreatic ductal dilatation or surrounding inflammatory changes. Spleen: Normal in size without focal abnormality. Adrenals/Urinary Tract: The adrenal glands unremarkable. The kidneys, visualized ureters, and urinary bladder are unremarkable. Stomach/Bowel: There is no bowel obstruction or active inflammation. No CT evidence of acute appendicitis. Vascular/Lymphatic: Mild aortoiliac atherosclerotic disease. The IVC is unremarkable. No portal gas. There is no adenopathy. Reproductive: The prostate and seminal vesicles are grossly unremarkable. No pelvic mass. Other: None Musculoskeletal: Extensive degenerative changes of the spine. No acute osseous pathology. IMPRESSION: 1. No acute intra-abdominal or pelvic pathology. No bowel obstruction. 2. Fatty liver with morphologic changes of cirrhosis. Small ascites. 3. Aortic Atherosclerosis (ICD10-I70.0). Electronically Signed   By: Elgie Collard M.D.   On: 02/13/2021 02:42   CT KNEE RIGHT WO CONTRAST  Result Date: 02/13/2021 CLINICAL DATA:  Knee instability, prior surgery on right knee due to osteomyelitis EXAM: CT OF THE right KNEE WITHOUT CONTRAST TECHNIQUE: Multidetector CT imaging of the right knee was performed according to the standard protocol. Multiplanar CT image reconstructions were also generated. COMPARISON:  Partially visualized on prior MRI in June 2022, radiograph 09/27/2020 FINDINGS: Bones/Joint/Cartilage There  is tricompartment osteophyte formation, subchondral sclerosis, and cystic change. There is severe medial and lateral joint space narrowing, which appears progressed since the  prior radiograph. There is marked articular surface irregularity in the medial and lateral compartments. There is articular surface collapse measuring up to 6 mm deep and 2.7 x 1.8 cm wide along the periphery of the lateral tibial plateau (coronal image 41, sagittal image 31). There is subchondral lucency along the lateral femoral condyle anteriorly. There is the appearance of focal bony erosion at the far posterolateral aspect of the lateral tibial plateau (sagittal image 8, coronal image 55, and similar erosive change at the anterior aspect of the fibular head at the tibiofibular joint. These osseous changes are not clearly seen on prior MRI, though the knee is partially imaged on that exam. There is a small to moderate size knee joint effusion, which appears somewhat heterogeneous. Ligaments Suboptimally assessed by CT. Muscles and Tendons No muscle atrophy. An ovoid collection just posterior to the fibula which appears to course between the popliteus and soleus muscles measuring up to 2.8 x 2.2 x 4.2 cm (series 4, image 53, coronal image 62). Soft tissues There is diffuse soft tissue swelling along the knee. IMPRESSION: Findings are suspicious for ongoing septic arthritis of the right knee. Recommend knee MRI and joint aspiration. Bones: Severe progressive arthritis with marked articular surface irregularity, erosive changes at the far posterolateral aspect of the lateral tibial plateau and fibular head at the tibiofibular joint. New depressed lateral tibial plateau fracture with 6 mm articular surface depression, which could potentially be pathologic due to underlying infection. Fluid/Soft tissues: Small to moderate-sized joint effusion which appears heterogeneous. Possible fluid collection extending from the posterolateral knee joint  inferiorly between the popliteus and soleus muscles which measures 2.8 x 2.2 x 4.2 cm. Electronically Signed   By: Caprice Renshaw M.D.   On: 02/13/2021 08:48   IR ABDOMEN US LIMITED  Result Date: 02/14/2021 CLINICAL DATA:  60 year old male presenting for paracentesis. EXAM: LIMITED ABDOMEN ULTRASOUND FOR ASCITES TECHNIQUE: Limited ultrasound survey for ascites was performed in all four abdominal quadrants. COMPARISON:  CT abdomen pelvis from 02/13/2021 FINDINGS: Trace lower abdominal ascites.  No safe window for paracentesis. IMPRESSION: Trace ascites.  No paracentesis performed. Marliss Coots, MD [facility] Radiology Electronically Signed   By: Marliss Coots M.D.   On: 02/14/2021 07:24   US Abdomen Limited RUQ (LIVER/GB)  Result Date: 02/12/2021 CLINICAL DATA:  Jaundice. EXAM: ULTRASOUND ABDOMEN LIMITED RIGHT UPPER QUADRANT COMPARISON:  None. FINDINGS: Gallbladder: There is sludge within the gallbladder. The gallbladder wall is diffusely thickened measuring up to 5 mm. There is a small pericholecystic fluid. This may be related to underlying liver disease, given negative sonographic Murphy's sign. Acute cholecystitis is not entirely excluded. Common bile duct: Diameter: 4 mm Liver: Diffuse increased liver echogenicity with nodular contour consistent with cirrhosis. Portal vein is patent on color Doppler imaging with normal direction of blood flow towards the liver. Other: Small perihepatic sinus. IMPRESSION: 1. Cirrhosis and small ascites. 2. Patent main portal vein with hepatopetal flow. 3. Gallbladder sludge. Diffuse thickened appearance of the gallbladder wall, likely related to underlying liver disease. Clinical correlation is recommended. Electronically Signed   By: Elgie Collard M.D.   On: 02/12/2021 23:32     LOS: 1 day   Lanae Boast, MD [facility]  02/14/2021, 9:37 AM

## 2021-02-14 NOTE — TOC Initial Note (Signed)
Transition of Care [facility]) - Initial/Assessment Note    Patient Details  Name: Kyle Pitts MRN: [id number] Date of Birth: Jan 16, 1961  [facility] CM/SW Contact:    Tom-Johnson, Hershal Coria, RN Phone Number: 02/14/2021, 4:36 PM  Clinical Narrative:                 CM spoke with patient at bedside about needs for post hospital transition. Presented with abdominal pain, AKI. States his sister lives with him as she is recovering from illness. Has two children and three living siblings. Currently employed by a Engineer, maintenance ([employer]). Will not give name. Independent with care and drives self prior to hospitalization. Has a cane and walker at home. PCP is Darryll Capers and uses [facility] on [address]. Consult for substance abuse counseling. States he drinks 4-6 beers daily. Resources given to patient at bedside and online resources placed on AVS. No PT/OT recommendations noted. Plan for EGD tomorrow 10/29 for Varices. Receives Albumin q 6 hrs.  Brother to transport at discharge. CM will continue to follow with needs.    Expected Discharge Plan: Home/Self Care Barriers to Discharge: Continued Medical Work up   Patient Goals and CMS Choice Patient states their goals for this hospitalization and ongoing recovery are:: To go home CMS Medicare.gov Compare Post Acute Care list provided to:: Patient    Expected Discharge Plan and Services Expected Discharge Plan: Home/Self Care   Discharge Planning Services: CM Consult   Living arrangements for the past 2 months: Single Family Home                                      Prior Living Arrangements/Services Living arrangements for the past 2 months: Single Family Home Lives with:: Siblings (Sister staying with him to recover from illness.) Patient language and need for interpreter reviewed:: Yes Do you feel safe going back to the place where you live?: Yes      Need for Family Participation in  Patient Care: Yes (Comment) Care giver support system in place?: Yes (comment) Current home services: DME (Cane, walker) Criminal Activity/Legal Involvement Pertinent to Current Situation/Hospitalization: No - Comment as needed  Activities of Daily Living Home Assistive Devices/Equipment: None ADL Screening (condition at time of admission) Patient's cognitive ability adequate to safely complete daily activities?: Yes Is the patient deaf or have difficulty hearing?: No Does the patient have difficulty seeing, even when wearing glasses/contacts?: No Does the patient have difficulty concentrating, remembering, or making decisions?: No Patient able to express need for assistance with ADLs?: Yes Does the patient have difficulty dressing or bathing?: No Independently performs ADLs?: Yes (appropriate for developmental age) Does the patient have difficulty walking or climbing stairs?: No Weakness of Legs: None Weakness of Arms/Hands: None  Permission Sought/Granted Permission sought to share information with : Case Manager, Family Supports Permission granted to share information with : Yes, Verbal Permission Granted              Emotional Assessment Appearance:: Appears stated age Attitude/Demeanor/Rapport: Engaged Affect (typically observed): Accepting, Appropriate, Calm, Hopeful Orientation: : Oriented to Self, Oriented to Place, Oriented to  Time, Oriented to Situation Alcohol / Substance Use: Alcohol Use Psych Involvement: No (comment)  Admission diagnosis:  ARF (acute renal failure) (HCC) [N17.9] Jaundice [R17] Other ascites [R18.8] Acute liver failure without hepatic coma [K72.00] Abdominal ascites [R18.8] Patient Active Problem List  Diagnosis Date Noted   ARF (acute renal failure) (HCC) 02/13/2021   Elevated LFTs 02/13/2021   Allergic reaction 11/21/2020   Osteomyelitis of right leg (HCC) 11/21/2020   Streptococcus infection, group A 11/21/2020   Myofasciitis  11/21/2020   PICC line infection 11/08/2020   Anemia 11/08/2020   Hyponatremia 10/07/2020   Leucocytosis 10/07/2020   Cellulitis and abscess of right leg 10/07/2020   Septic arthritis of knee (HCC) 09/28/2020   ALLERGIC RHINITIS 01/13/2007   ASTHMA 01/13/2007   PCP:  [name], MD Pharmacy:   [facility] [phone] [place], [place] - [address] AT [facility] [address] [place] [address] Phone: [phone] Fax: [phone]     Social Determinants of Health (SDOH) Interventions    Readmission Risk Interventions No flowsheet data found.

## 2021-02-15 ENCOUNTER — Inpatient Hospital Stay (HOSPITAL_COMMUNITY): Payer: Medicaid Other | Admitting: Anesthesiology

## 2021-02-15 ENCOUNTER — Encounter (HOSPITAL_COMMUNITY)
Admission: EM | Disposition: A | Discharge status: Home / Self Care | Payer: Self-pay | Source: Home / Self Care | Attending: Internal Medicine

## 2021-02-15 ENCOUNTER — Encounter (HOSPITAL_COMMUNITY): Payer: Self-pay | Admitting: Internal Medicine

## 2021-02-15 DIAGNOSIS — K269 Duodenal ulcer, unspecified as acute or chronic, without hemorrhage or perforation: Secondary | ICD-10-CM

## 2021-02-15 DIAGNOSIS — K766 Portal hypertension: Secondary | ICD-10-CM

## 2021-02-15 DIAGNOSIS — K3189 Other diseases of stomach and duodenum: Secondary | ICD-10-CM

## 2021-02-15 DIAGNOSIS — I8510 Secondary esophageal varices without bleeding: Secondary | ICD-10-CM

## 2021-02-15 DIAGNOSIS — K259 Gastric ulcer, unspecified as acute or chronic, without hemorrhage or perforation: Secondary | ICD-10-CM

## 2021-02-15 DIAGNOSIS — K7031 Alcoholic cirrhosis of liver with ascites: Secondary | ICD-10-CM

## 2021-02-15 DIAGNOSIS — K7030 Alcoholic cirrhosis of liver without ascites: Secondary | ICD-10-CM

## 2021-02-15 DIAGNOSIS — D62 Acute posthemorrhagic anemia: Secondary | ICD-10-CM

## 2021-02-15 HISTORY — PX: ESOPHAGOGASTRODUODENOSCOPY: SHX5428

## 2021-02-15 HISTORY — PX: BIOPSY: SHX5522

## 2021-02-15 LAB — CBC
HCT: 25.1 % — ABNORMAL LOW (ref 39.0–52.0)
Hemoglobin: 8.7 g/dL — ABNORMAL LOW (ref 13.0–17.0)
MCH: 36.4 pg — ABNORMAL HIGH (ref 26.0–34.0)
MCHC: 34.7 g/dL (ref 30.0–36.0)
MCV: 105 fL — ABNORMAL HIGH (ref 80.0–100.0)
Platelets: 201 10*3/uL (ref 150–400)
RBC: 2.39 MIL/uL — ABNORMAL LOW (ref 4.22–5.81)
RDW: 21.1 % — ABNORMAL HIGH (ref 11.5–15.5)
WBC: 7.4 10*3/uL (ref 4.0–10.5)
nRBC: 0 % (ref 0.0–0.2)

## 2021-02-15 LAB — IGG: IgG (Immunoglobin G), Serum: 2342 mg/dL — ABNORMAL HIGH (ref 603–1613)

## 2021-02-15 LAB — COMPREHENSIVE METABOLIC PANEL
ALT: 41 U/L (ref 0–44)
AST: 102 U/L — ABNORMAL HIGH (ref 15–41)
Albumin: 2.5 g/dL — ABNORMAL LOW (ref 3.5–5.0)
Alkaline Phosphatase: 211 U/L — ABNORMAL HIGH (ref 38–126)
Anion gap: 6 (ref 5–15)
BUN: 15 mg/dL (ref 6–20)
CO2: 20 mmol/L — ABNORMAL LOW (ref 22–32)
Calcium: 8.4 mg/dL — ABNORMAL LOW (ref 8.9–10.3)
Chloride: 106 mmol/L (ref 98–111)
Creatinine, Ser: 0.85 mg/dL (ref 0.61–1.24)
GFR, Estimated: >60 mL/min (ref >60)
Glucose, Bld: 98 mg/dL (ref 70–99)
Potassium: 3.4 mmol/L — ABNORMAL LOW (ref 3.5–5.1)
Sodium: 132 mmol/L — ABNORMAL LOW (ref 135–145)
Total Bilirubin: 21 mg/dL (ref 0.3–1.2)
Total Protein: 6.6 g/dL (ref 6.5–8.1)

## 2021-02-15 LAB — ALPHA-1-ANTITRYPSIN: A-1 Antitrypsin, Ser: 156 mg/dL (ref 101–187)

## 2021-02-15 LAB — CERULOPLASMIN: Ceruloplasmin: 23.8 mg/dL (ref 16.0–31.0)

## 2021-02-15 SURGERY — EGD (ESOPHAGOGASTRODUODENOSCOPY)
Anesthesia: Monitor Anesthesia Care

## 2021-02-15 MED ORDER — OXYCODONE HCL 5 MG PO TABS
5.0000 mg | ORAL_TABLET | Freq: Once | ORAL | Status: DC | PRN
Start: 2021-02-15 — End: 2021-02-15

## 2021-02-15 MED ORDER — PROPRANOLOL HCL 20 MG PO TABS
20.0000 mg | ORAL_TABLET | Freq: Two times a day (BID) | ORAL | Status: DC
  Administered 2021-02-15 (×2): 20 mg via ORAL
  Filled 2021-02-15 (×4): qty 1

## 2021-02-15 MED ORDER — SODIUM CHLORIDE 0.9 % IV SOLN: INTRAVENOUS | Status: DC

## 2021-02-15 MED ORDER — PROPOFOL 500 MG/50ML IV EMUL
INTRAVENOUS | Status: DC | PRN
  Administered 2021-02-15: 125 ug/kg/min via INTRAVENOUS

## 2021-02-15 MED ORDER — LIDOCAINE 2% (20 MG/ML) 5 ML SYRINGE
INTRAMUSCULAR | Status: DC | PRN
  Administered 2021-02-15: 40 mg via INTRAVENOUS

## 2021-02-15 MED ORDER — LACTATED RINGERS IV SOLN: INTRAVENOUS | Status: DC | PRN

## 2021-02-15 MED ORDER — PROPOFOL 10 MG/ML IV BOLUS
INTRAVENOUS | Status: DC | PRN
  Administered 2021-02-15 (×2): 20 mg via INTRAVENOUS

## 2021-02-15 MED ORDER — OXYCODONE HCL 5 MG/5ML PO SOLN
5.0000 mg | Freq: Once | ORAL | Status: DC | PRN
Start: 2021-02-15 — End: 2021-02-15

## 2021-02-15 MED ORDER — PANTOPRAZOLE SODIUM 40 MG PO TBEC
40.0000 mg | DELAYED_RELEASE_TABLET | Freq: Two times a day (BID) | ORAL | Status: DC
  Administered 2021-02-15 – 2021-02-16 (×3): 40 mg via ORAL
  Filled 2021-02-15 (×4): qty 1

## 2021-02-15 MED ORDER — ONDANSETRON HCL 4 MG/2ML IJ SOLN
4.0000 mg | Freq: Four times a day (QID) | INTRAMUSCULAR | Status: DC | PRN
Start: 2021-02-15 — End: 2021-02-15

## 2021-02-15 NOTE — Anesthesia Postprocedure Evaluation (Signed)
Anesthesia Post Note  Patient: Kyle Pitts  Procedure(s) Performed: ESOPHAGOGASTRODUODENOSCOPY (EGD) BIOPSY     Patient location during evaluation: Endoscopy Anesthesia Type: MAC Level of consciousness: awake and alert Pain management: pain level controlled Vital Signs Assessment: post-procedure vital signs reviewed and stable Respiratory status: spontaneous breathing, nonlabored ventilation, respiratory function stable and patient connected to nasal cannula oxygen Cardiovascular status: stable and blood pressure returned to baseline Postop Assessment: no apparent nausea or vomiting Anesthetic complications: no   No notable events documented.  Last Vitals:  Vitals:   02/15/21 1002 02/15/21 1632  BP: 140/85 115/63  Pulse: 68 66  Resp: 18 18  Temp: 36.8 C 36.9 C  SpO2: 99% 100%    Last Pain:  Vitals:   02/15/21 1632  TempSrc: Oral  PainSc:                  Britanny Marksberry S

## 2021-02-15 NOTE — Progress Notes (Signed)
PROGRESS NOTE    Kyle Pitts  TAV:[id number] DOB: [birth year] DOA: 02/12/2021 PCP: [name], MD   Chief Complaint  Patient presents with   Abdominal Pain   Brief Narrative/Hospital Course:  Kyle Pitts, 60 y.o. 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  She reports taking ibuprofen since her admission for septic arthritis for the pain in the right knee.  Patient reports she was having left-sided abdominal pain and noticed yellowish discoloration of the skin in eyes for last 5 days. He does endorse he drinks alcohol every day at least 6 beers daily In the ED patient was found to have significantly elevated LFTs, AKI. CT of the right knee showed possible septic arthritis.  GI and orthopedic was consulted and patient was admitted. Underwent knee aspiration no evidence of septic arthritis.  Seen by GI underwent EGD that showed gastric and duodenal ulcers, esophageal varices not banded, portal hypertensive gastropathy.   Subjective: Resting well, no complaints  Wants to shower Overnight no fever T-max 98.5.  Hemoglobin has been holding stable, underwent endoscopic evaluation this morning  Assessment & Plan:  Jaundice/abnormal LFTs Alcoholic cirrhosis with portal hypertensive gastropathy, esophageal varices without stigmata of bleeding: Most likely alcoholic cirrhosis, question alcoholic hepatitis but less likely as per GI, undergoing extensive work-up with autoimmune markers-IgG elevated to 2342, normal ceruloplasmin hep B surface antigen alpha antitrypsin, pending antimitochondrial antibody ANA anti-smooth muscle antibody Underwent EGD evaluation 10/29. Fortunately AST ALT improving but total bili is significantly up 21- continue on low-salt diet, underwent attempted paracentesis but unsuccessful due to insufficient ascites, GI discussed role of liver  transplant and cirrhosis but not a candidate given recent alcohol use and will need to be off for at least 6 months.  Plan is to have outpatient close follow-up with GI. Recent Labs  Lab 02/12/21 1903 02/13/21 0440 02/13/21 0752 02/14/21 0150 02/15/21 0210  AST 155*  --  131* 104* 102*  ALT 52*  --  46* 38 41  ALKPHOS 273*  --  260* 223* 211*  BILITOT 23.7*  --  21.0* 22.3* 21.0*  PROT 7.5  --  6.8 7.0 6.6  ALBUMIN 2.2*  --  1.9* 2.6* 2.5*  INR  --  1.4* 1.5* 1.4*  --      Anemia/dark stool Gastric and duodenal ulcers likely due to Motrin: Hemoglobin drifted slightly, given she recently underwent EGD evaluation 10/29 that shows gastric and duodenal ulcer likely source of his abdominal pain melena and drop in hemoglobin as well as worsening of his hyperbilirubinemia as per GI.  Continue with Protonix twice daily and repeat EGD in 8 weeks as outpatient.  Monitoring 1 more day Recent Labs  Lab 02/12/21 1903 02/13/21 0752 02/14/21 0150 02/15/21 0210  HGB 10.2* 9.0* 8.7* 8.7*  HCT 29.5* 26.5* 24.6* 25.1*   ARF: In the setting of NSAID use, renal function improved to baseline.  Stop IV fluids encourage oral hydration.  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 Last etoh on Saturday.  Watch for withdrawal continue thiamine folate and CIWA scale Ativan  Recent right knee septic arthritis in June completed antibiotics.  Aspiration done by ID currently no evidence of septic arthritis gram stain culture no growth so far.  Continue pain control and have outpatient follow-up with orthopedics.   Hypokalemia improved.   Hyponatremia likely from cirrhosis.  Monitor Obesity class I:Patient's Body mass index is 32.17 kg/m. :  Will benefit with weight loss PCP follow-up.  DVT prophylaxis: SCDs Start: 02/13/21 0615 NO  chemical prophylaxis due to cirrhosis anemia Code Status:   Code Status: Full Code Family Communication: plan of care discussed with patient at bedside. Status is: Inpatient  Remains inpatient appropriate because: For ongoing management of liver cirrhosis, right knee pain and anemia-monitoring 1 more day   Objective: Vitals last 24 hrs: Vitals:   02/15/21 0705 02/15/21 0754 02/15/21 0809 02/15/21 0824  BP: 135/84 94/65 123/90 139/89  Pulse: 70 70 65 69  Resp: 13 15 16 15   Temp: 98.5 F (36.9 C) 97.9 F (36.6 C)  98.3 F (36.8 C)  TempSrc: Temporal     SpO2: 100% 95% 100% 100%  Weight:       Weight change:   Intake/Output Summary (Last 24 hours) at 02/15/2021 0839 Last data filed at 02/15/2021 0745 Gross per 24 hour  Intake 2344.49 ml  Output 700 ml  Net 1644.49 ml    Net IO Since Admission: 4,174.68 mL [02/15/21 0839]   Physical Examination: General exam: AAOx 3,older than stated age, weak appearing. HEENT:Oral mucosa moist, Ear/Nose WNL grossly, dentition normal. Icterus++ Respiratory system: bilaterally clear no use of accessory muscle Cardiovascular system: S1 & S2 +, No JVD,. Gastrointestinal system: Abdomen soft, NT,ND, BS+ Nervous System:Alert, awake, moving extremities and grossly nonfocal Extremities: LE edema +, distal peripheral pulses palpable.  Skin: No rashes,no icterus. MSK: Normal muscle bulk,tone, power   Medications reviewed:  Scheduled Meds:  [MAR Hold] folic acid  1 mg Oral Daily   [MAR Hold] multivitamin with minerals  1 tablet Oral Daily   pantoprazole  40 mg Oral BID   propranolol  20 mg Oral BID   [MAR Hold] thiamine  100 mg Oral Daily   Or   [MAR Hold] thiamine  100 mg Intravenous Daily   Continuous Infusions:  sodium chloride     sodium chloride 50 mL/hr at 02/14/21 1312   Diet Order             Diet 2 gram sodium Room service appropriate? Yes; Fluid consistency: Thin  Diet effective now                           Weight change:   Wt Readings from Last 3 Encounters:  02/14/21 107.6 kg  11/21/20 98.4 kg  11/09/20 91.3 kg     Consultants:see note  Procedures:see note Antimicrobials: Anti-infectives (From admission, onward)    None      Culture/Microbiology    Component Value Date/Time   SDES FLUID RIGHT KNEE 02/13/2021 1732   SPECREQUEST NONE 02/13/2021 1732   CULT  02/13/2021 1732    NO GROWTH < 24 HOURS Performed at [facility], [address], [place], [place]    REPTSTATUS PENDING 02/13/2021 1732    Other culture-see note  Unresulted Labs (From admission, onward)     Start     Ordered   02/15/21 0500  Comprehensive metabolic panel  Daily,   R      02/14/21 1252   02/15/21 0500  CBC  Daily,   R  02/14/21 1252   02/14/21 0500  ANA, IFA (with reflex)  Tomorrow morning,   R        02/13/21 1214   02/14/21 0500  Mitochondrial antibodies  (Mitochondiral/Smooth Muscle AB PNL (PNL))  Tomorrow morning,   R        02/13/21 1214   02/14/21 0500  Anti-smooth muscle antibody, IgG  (Mitochondiral/Smooth Muscle AB PNL (PNL))  Tomorrow morning,   R        02/13/21 1214           Data Reviewed: I have personally reviewed following labs and imaging studies CBC: Recent Labs  Lab 02/12/21 1903 02/13/21 0752 02/14/21 0150 02/15/21 0210  WBC 9.8 7.7 7.1 7.4  NEUTROABS  --  4.8  --   --   HGB 10.2* 9.0* 8.7* 8.7*  HCT 29.5* 26.5* 24.6* 25.1*  MCV 103.9* 103.9* 101.7* 105.0*  PLT 185 183 176 201    Basic Metabolic Panel: Recent Labs  Lab 02/12/21 1903 02/13/21 0752 02/14/21 0150 02/15/21 0210  NA 128* 128* 131* 132*  K 3.4* 3.2* 3.7 3.4*  CL 99 98 103 106  CO2 20* 21* 22 20*  GLUCOSE 90 82 106* 98  BUN 35* 34* 24* 15  CREATININE 2.69* 1.88* 1.14 0.85  CALCIUM 8.2* 7.9* 8.4* 8.4*    GFR: Estimated Creatinine Clearance: 120.1 mL/min (by C-G formula based on SCr of 0.85 mg/dL). Liver Function Tests: Recent Labs  Lab 02/12/21 1903  02/13/21 0752 02/14/21 0150 02/15/21 0210  AST 155* 131* 104* 102*  ALT 52* 46* 38 41  ALKPHOS 273* 260* 223* 211*  BILITOT 23.7* 21.0* 22.3* 21.0*  PROT 7.5 6.8 7.0 6.6  ALBUMIN 2.2* 1.9* 2.6* 2.5*    Recent Labs  Lab 02/12/21 1903  LIPASE 98*    No results for input(s): AMMONIA in the last 168 hours. Coagulation Profile: Recent Labs  Lab 02/13/21 0440 02/13/21 0752 02/14/21 0150  INR 1.4* 1.5* 1.4*    Cardiac Enzymes: No results for input(s): CKTOTAL, CKMB, CKMBINDEX, TROPONINI in the last 168 hours. BNP (last 3 results) No results for input(s): PROBNP in the last 8760 hours. HbA1C: No results for input(s): HGBA1C in the last 72 hours. CBG: No results for input(s): GLUCAP in the last 168 hours. Lipid Profile: No results for input(s): CHOL, HDL, LDLCALC, TRIG, CHOLHDL, LDLDIRECT in the last 72 hours. Thyroid Function Tests: No results for input(s): TSH, T4TOTAL, FREET4, T3FREE, THYROIDAB in the last 72 hours. Anemia Panel: No results for input(s): VITAMINB12, FOLATE, FERRITIN, TIBC, IRON, RETICCTPCT in the last 72 hours. Sepsis Labs: No results for input(s): PROCALCITON, LATICACIDVEN in the last 168 hours.  Recent Results (from the past 240 hour(s))  Resp Panel by RT-PCR (Flu A&B, Covid) Nasopharyngeal Swab     Status: None   Collection Time: 02/13/21  3:43 AM   Specimen: Nasopharyngeal Swab; Nasopharyngeal(NP) swabs in vial transport medium  Result Value Ref Range Status   SARS Coronavirus 2 by RT PCR NEGATIVE NEGATIVE Final    Comment: (NOTE) SARS-CoV-2 target nucleic acids are NOT DETECTED.  The SARS-CoV-2 RNA is generally detectable in upper respiratory specimens during the acute phase of infection. The lowest concentration of SARS-CoV-2 viral copies this assay can detect is 138 copies/mL. A negative result does not preclude SARS-Cov-2 infection and should not be used as the sole basis for treatment or other patient management decisions. A negative  result may occur with  improper specimen collection/handling, submission of specimen other than nasopharyngeal  swab, presence of viral mutation(s) within the areas targeted by this assay, and inadequate number of viral copies(<138 copies/mL). A negative result must be combined with clinical observations, patient history, and epidemiological information. The expected result is Negative.  Fact Sheet for Patients:  BloggerCourse.com  Fact Sheet for Healthcare Providers:  SeriousBroker.it  This test is no t yet approved or cleared by the [place] FDA and  has been authorized for detection and/or diagnosis of SARS-CoV-2 by FDA under an Emergency Use Authorization (EUA). This EUA will remain  in effect (meaning this test can be used) for the duration of the COVID-19 declaration under Section 564(b)(1) of the Act, 21 U.S.C.section 360bbb-3(b)(1), unless the authorization is terminated  or revoked sooner.       Influenza A by PCR NEGATIVE NEGATIVE Final   Influenza B by PCR NEGATIVE NEGATIVE Final    Comment: (NOTE) The Xpert Xpress SARS-CoV-2/FLU/RSV plus assay is intended as an aid in the diagnosis of influenza from Nasopharyngeal swab specimens and should not be used as a sole basis for treatment. Nasal washings and aspirates are unacceptable for Xpert Xpress SARS-CoV-2/FLU/RSV testing.  Fact Sheet for Patients: BloggerCourse.com  Fact Sheet for Healthcare Providers: SeriousBroker.it  This test is not yet approved or cleared by the [place] FDA and has been authorized for detection and/or diagnosis of SARS-CoV-2 by FDA under an Emergency Use Authorization (EUA). This EUA will remain in effect (meaning this test can be used) for the duration of the COVID-19 declaration under Section 564(b)(1) of the Act, 21 U.S.C. section 360bbb-3(b)(1), unless the authorization is  terminated or revoked.  Performed at [facility], [address], [place], [place] [address]   Body fluid culture w Gram Stain     Status: None (Preliminary result)   Collection Time: 02/13/21  5:32 PM   Specimen: Body Fluid  Result Value Ref Range Status   Specimen Description FLUID RIGHT KNEE  Final   Special Requests NONE  Final   Gram Stain   Final    MODERATE WBC PRESENT, PREDOMINANTLY MONONUCLEAR NO ORGANISMS SEEN    Culture   Final    NO GROWTH < 24 HOURS Performed at [facility], [address] [place], [place] [address]    Report Status PENDING  Incomplete      Radiology Studies: IR ABDOMEN US LIMITED  Result Date: 02/14/2021 CLINICAL DATA:  60 year old male presenting for paracentesis. EXAM: LIMITED ABDOMEN ULTRASOUND FOR ASCITES TECHNIQUE: Limited ultrasound survey for ascites was performed in all four abdominal quadrants. COMPARISON:  CT abdomen pelvis from 02/13/2021 FINDINGS: Trace lower abdominal ascites.  No safe window for paracentesis. IMPRESSION: Trace ascites.  No paracentesis performed. Marliss Coots, MD [facility] Electronically Signed   By: Marliss Coots M.D.   On: 02/14/2021 07:24     LOS: 2 days   Lanae Boast, MD [facility]  02/15/2021, 8:39 AM

## 2021-02-15 NOTE — Interval H&P Note (Signed)
History and Physical Interval Note:  No changes in status since yesterday.  Hgb stable, Scr back to baseline. Tbili still very elevated.  EGD today to screen for varices and assess for source of anemia.  02/15/2021 7:25 AM  Kyle Pitts  has presented today for surgery, with the diagnosis of Cirrhosis, anemia.  The various methods of treatment have been discussed with the patient and family. After consideration of risks, benefits and other options for treatment, the patient has consented to  Procedure(s): ESOPHAGOGASTRODUODENOSCOPY (EGD) (N/A) as a surgical intervention.  The patient's history has been reviewed, patient examined, no change in status, stable for surgery.  I have reviewed the patient's chart and labs.  Questions were answered to the patient's satisfaction.     Jenel Lucks

## 2021-02-15 NOTE — Anesthesia Preprocedure Evaluation (Addendum)
Anesthesia Evaluation  Patient identified by MRN, date of birth, ID band Patient awake    Reviewed: Allergy & Precautions, H&P , NPO status , Patient's Chart, lab work & pertinent test results  Airway Mallampati: II   Neck ROM: full    Dental   Pulmonary neg pulmonary ROS,    breath sounds clear to auscultation       Cardiovascular negative cardio ROS   Rhythm:regular Rate:Normal     Neuro/Psych negative neurological ROS     GI/Hepatic negative GI ROS, (+) Cirrhosis     substance abuse  alcohol use,   Endo/Other  negative endocrine ROS  Renal/GU negative Renal ROS     Musculoskeletal negative musculoskeletal ROS (+)   Abdominal   Peds  Hematology  (+) Blood dyscrasia, anemia ,   Anesthesia Other Findings   Reproductive/Obstetrics                            Anesthesia Physical Anesthesia Plan  ASA: 3  Anesthesia Plan: MAC   Post-op Pain Management:    Induction: Intravenous  PONV Risk Score and Plan: 1 and Propofol infusion and Treatment may vary due to age or medical condition  Airway Management Planned: Nasal Cannula  Additional Equipment:   Intra-op Plan:   Post-operative Plan:   Informed Consent: I have reviewed the patients History and Physical, chart, labs and discussed the procedure including the risks, benefits and alternatives for the proposed anesthesia with the patient or authorized representative who has indicated his/her understanding and acceptance.     Dental advisory given  Plan Discussed with: CRNA, Anesthesiologist and Surgeon  Anesthesia Plan Comments:        Anesthesia Quick Evaluation

## 2021-02-15 NOTE — Transfer of Care (Signed)
Immediate Anesthesia Transfer of Care Note  Patient: Kyle Pitts  Procedure(s) Performed: ESOPHAGOGASTRODUODENOSCOPY (EGD) BIOPSY  Patient Location: PACU  Anesthesia Type:MAC  Level of Consciousness: drowsy  Airway & Oxygen Therapy: Patient Spontanous Breathing  Post-op Assessment: Report given to RN and Post -op Vital signs reviewed and stable  Post vital signs: Reviewed and stable  Last Vitals:  Vitals Value Taken Time  BP 94/65 02/15/21 0754  Temp    Pulse 69 02/15/21 0754  Resp 17 02/15/21 0754  SpO2 96 % 02/15/21 0754  Vitals shown include unvalidated device data.  Last Pain:  Vitals:   02/15/21 0705  TempSrc: Temporal  PainSc: 0-No pain      Patients Stated Pain Goal: 0 (08/71/99 4129)  Complications: No notable events documented.

## 2021-02-15 NOTE — Op Note (Signed)
[facility] Patient Name: Kyle Pitts Procedure Date : 02/15/2021 MRN: [id number] Attending MD: Dub Amis. Tomasa Rand , MD Date of Birth: 1960/05/02 CSN: [id number] Age: 60 Admit Type: Inpatient Procedure:                Upper GI endoscopy Indications:              Acute post hemorrhagic anemia, Melena, Cirrhosis                            rule out esophageal varices Providers:                Lorin Picket E. Tomasa Rand, MD, Roselie Awkward, RN, [facility] Technician, Technician Referring MD:              Medicines:                Monitored Anesthesia Care Complications:            No immediate complications. Estimated Blood Loss:     Estimated blood loss was minimal. Procedure:                Pre-Anesthesia Assessment:                           - Prior to the procedure, a History and Physical                            was performed, and patient medications and                            allergies were reviewed. The patient's tolerance of                            previous anesthesia was also reviewed. The risks                            and benefits of the procedure and the sedation                            options and risks were discussed with the patient.                            All questions were answered, and informed consent                            was obtained. Prior Anticoagulants: The patient has                            taken no previous anticoagulant or antiplatelet                            agents. ASA Grade Assessment: III - A patient with  severe systemic disease. After reviewing the risks                            and benefits, the patient was deemed in                            satisfactory condition to undergo the procedure.                           After obtaining informed consent, the endoscope was                            passed under direct vision. Throughout the                             procedure, the patient's blood pressure, pulse, and                            oxygen saturations were monitored continuously. The                            [id number]-[id number] ([id number]) Olympus endoscope was introduced                            through the mouth, and advanced to the third part                            of duodenum. The upper GI endoscopy was                            accomplished without difficulty. The patient                            tolerated the procedure well. Scope In: Scope Out: Findings:      The examined portions of the nasopharynx, oropharynx and larynx were       normal. Yellowish discoloration of the pharyngeal and oropharyngeal       mucosa were noted      Grade II varices were found in the middle third of the esophagus and in       the lower third of the esophagus. They were large in size.      The exam of the esophagus was otherwise normal.      Diffuse portal hypertensive gastropathy was found in the gastric fundus       and in the gastric body.      One non-bleeding cratered gastric ulcer with no stigmata of bleeding was       found in the prepyloric region of the stomach. The lesion was 6 mm in       largest dimension. Biopsies were taken with a cold forceps for       histology. Estimated blood loss was minimal.      The exam of the stomach was otherwise normal.      Biopsies were taken with a cold forceps on the greater curvature of the       stomach, at the incisura and in the  gastric antrum for Helicobacter       pylori testing. Estimated blood loss was minimal.      One non-bleeding superficial duodenal ulcer with no stigmata of bleeding       was found in the duodenal bulb. The lesion was 7 mm in largest dimension.      One non-bleeding cratered duodenal ulcer with no stigmata of bleeding       was found in the second portion of the duodenum. The lesion was 8 mm in       largest dimension. Biopsies were taken with a cold forceps for       histology.  Estimated blood loss was minimal.      The exam of the duodenum was otherwise normal. Impression:               - Yellowish discoloration of the pharynx secondary                            to hyperbilirubinemia. Otherwise the examined                            portions of the nasopharynx, oropharynx and larynx                            were normal.                           - Grade II esophageal varices. No red wale signs.                            Not banded.                           - Portal hypertensive gastropathy.                           - Non-bleeding gastric ulcer with no stigmata of                            bleeding. Biopsied.                           - Non-bleeding duodenal ulcer with no stigmata of                            bleeding.                           - Non-bleeding duodenal ulcer with no stigmata of                            bleeding. Biopsied.                           - Biopsies were taken with a cold forceps for                            Helicobacter pylori testing.                           -  The patient's abdominal pain, drop in hgb and                            dark stools can be attributed to bleeding peptic                            ulcers, likely NSAID-related. The GI bleed also                            probably contributed to the patients                            hyperbilirubinemia Recommendation:           - Return patient to hospital ward for ongoing care.                           - Resume previous diet.                           - Use Protonix (pantoprazole) 40 mg PO BID for 8                            weeks, then once daily indefinitely.                           - Await pathology results.                           - Repeat upper endoscopy in 8 weeks to check                            healing.                           - Start propranolol 20 mg PO BID and titrate dose                            to a resting HR of 55-60.                            - Will also discuss prophylactic banding of large                            esophageal varix further with patient. Not required                            today as no evidence of recent bleeding, and                            varices not suspected to be the source of the                            patient's GI bleed/anemia.                           -  Avoid all NSAIDs Procedure Code(s):        --- Professional ---                           [phone], Esophagogastroduodenoscopy, flexible,                            transoral; with biopsy, single or multiple Diagnosis Code(s):        --- Professional ---                           K74.60, Unspecified cirrhosis of liver                           I85.10, Secondary esophageal varices without                            bleeding                           K76.6, Portal hypertension                           K31.89, Other diseases of stomach and duodenum                           K25.9, Gastric ulcer, unspecified as acute or                            chronic, without hemorrhage or perforation                           K26.9, Duodenal ulcer, unspecified as acute or                            chronic, without hemorrhage or perforation                           D62, Acute posthemorrhagic anemia                           K92.1, Melena (includes Hematochezia) CPT copyright 2019 American Medical Association. All rights reserved. The codes documented in this report are preliminary and upon coder review may  be revised to meet current compliance requirements. Ellory Khurana E. Tomasa Rand, MD 02/15/2021 8:05:35 AM This report has been signed electronically. Number of Addenda: 0

## 2021-02-16 LAB — BODY FLUID CULTURE W GRAM STAIN: Culture: NO GROWTH

## 2021-02-16 LAB — CBC
HCT: 26.4 % — ABNORMAL LOW (ref 39.0–52.0)
Hemoglobin: 8.8 g/dL — ABNORMAL LOW (ref 13.0–17.0)
MCH: 35.8 pg — ABNORMAL HIGH (ref 26.0–34.0)
MCHC: 33.3 g/dL (ref 30.0–36.0)
MCV: 107.3 fL — ABNORMAL HIGH (ref 80.0–100.0)
Platelets: 226 10*3/uL (ref 150–400)
RBC: 2.46 MIL/uL — ABNORMAL LOW (ref 4.22–5.81)
RDW: 20.2 % — ABNORMAL HIGH (ref 11.5–15.5)
WBC: 7 10*3/uL (ref 4.0–10.5)
nRBC: 0 % (ref 0.0–0.2)

## 2021-02-16 LAB — COMPREHENSIVE METABOLIC PANEL
ALT: 39 U/L (ref 0–44)
AST: 100 U/L — ABNORMAL HIGH (ref 15–41)
Albumin: 2.3 g/dL — ABNORMAL LOW (ref 3.5–5.0)
Alkaline Phosphatase: 215 U/L — ABNORMAL HIGH (ref 38–126)
Anion gap: 5 (ref 5–15)
BUN: 16 mg/dL (ref 6–20)
CO2: 22 mmol/L (ref 22–32)
Calcium: 8.6 mg/dL — ABNORMAL LOW (ref 8.9–10.3)
Chloride: 104 mmol/L (ref 98–111)
Creatinine, Ser: 1 mg/dL (ref 0.61–1.24)
GFR, Estimated: >60 mL/min (ref >60)
Glucose, Bld: 100 mg/dL — ABNORMAL HIGH (ref 70–99)
Potassium: 3.7 mmol/L (ref 3.5–5.1)
Sodium: 131 mmol/L — ABNORMAL LOW (ref 135–145)
Total Bilirubin: 22.8 mg/dL (ref 0.3–1.2)
Total Protein: 7 g/dL (ref 6.5–8.1)

## 2021-02-16 LAB — ANTI-SMOOTH MUSCLE ANTIBODY, IGG: F-Actin IgG: 20 Units — ABNORMAL HIGH (ref 0–19)

## 2021-02-16 LAB — MITOCHONDRIAL ANTIBODIES: Mitochondrial M2 Ab, IgG: <20 Units (ref 0.0–20.0)

## 2021-02-16 MED ORDER — FOLIC ACID 1 MG PO TABS: 1.0000 mg | ORAL_TABLET | Freq: Every day | ORAL | 0 refills | Status: DC

## 2021-02-16 MED ORDER — THIAMINE HCL 100 MG PO TABS: 100.0000 mg | ORAL_TABLET | Freq: Every day | ORAL | 0 refills | Status: DC

## 2021-02-16 MED ORDER — PANTOPRAZOLE SODIUM 40 MG PO TBEC: 40.0000 mg | DELAYED_RELEASE_TABLET | Freq: Two times a day (BID) | ORAL | 1 refills | Status: DC

## 2021-02-16 MED ORDER — PROPRANOLOL HCL 20 MG PO TABS
20.0000 mg | ORAL_TABLET | Freq: Two times a day (BID) | ORAL | 0 refills | Status: DC
Start: 2021-02-16 — End: 2021-03-22

## 2021-02-16 NOTE — Discharge Summary (Signed)
Physician Discharge Summary  Kyle Pitts:[id number] DOB: 08/03/1960 DOA: 02/12/2021  PCP: [name], MD  Admit date: 02/12/2021 Discharge date: 02/16/2021  Admitted From: home Disposition:  home  Recommendations for Outpatient Follow-up:  Follow up with PCP, GI in 1-2 weeks Please obtain CMP/CBC in one week Please follow up on the following pending results:  Home Health:no  Equipment/Devices: none  Discharge Condition: Stable Code Status:   Code Status: Full Code Diet recommendation:  Diet Order             Diet 2 gram sodium Room service appropriate? Yes; Fluid consistency: Thin  Diet effective now                    Brief/Interim Summary: 60 y.o. 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  She reports taking ibuprofen since her admission for septic arthritis for the pain in the right knee.  Patient reports she was having left-sided abdominal pain and noticed yellowish discoloration of the skin in eyes for last 5 days. He does endorse he drinks alcohol every day at least 6 beers daily In the ED patient was found to have significantly elevated LFTs, AKI. CT of the right knee showed possible septic arthritis.  GI and orthopedic was consulted and patient was admitted. Underwent knee aspiration no evidence of septic arthritis.  Seen by GI underwent EGD that showed gastric and duodenal ulcers, esophageal varices not banded, portal hypertensive gastropathy Advised by GI to monitor 1 more day, he remains a stable alert awake oriented x3.  Tolerating diet.  AST slightly up but downtrending, renal function is stable hemoglobin is stable 8.8 g. Please follow-up with PCP coming week to check his labs and follow-up with [place] in GI. Extensive discussed about alcohol abstinence. He may end up in liver failure if he continues to drink  alcohol.  Discharge Diagnoses:  Jaundice/abnormal LFTs Alcoholic cirrhosis with portal hypertensive gastropathy Esophageal varices without stigmata of bleeding: Most likely alcoholic cirrhosis, question alcoholic hepatitis but less likely as per GI, has had extensive labd work-up with autoimmune markers-IgG elevated to 2342, normal ceruloplasmin hep B surface antigen alpha antitrypsin, pending antimitochondrial antibody ANA anti-smooth muscle antibody Underwent EGD evaluation 10/29. Fortunately AST ALT improving but total bili is significantly up 22s continue on low-salt diet, underwent attempted paracentesis but unsuccessful due to insufficient ascites, GI discussed role of liver transplant and cirrhosis but not a candidate given recent alcohol use and will need to be off for at least 6 months.  Plan is to have outpatient close follow-up with GI.  Started on propanolol.  Medically stable for discharge.   Recent Labs  Lab 02/12/21 1903 02/13/21 0440 02/13/21 0752 02/14/21 0150 02/15/21 0210 02/16/21 0819  AST 155*  --  131* 104* 102* 100*  ALT 52*  --  46* 38 41 39  ALKPHOS 273*  --  260* 223* 211* 215*  BILITOT 23.7*  --  21.0* 22.3* 21.0* 22.8*  PROT 7.5  --  6.8 7.0 6.6 7.0  ALBUMIN 2.2*  --  1.9* 2.6* 2.5* 2.3*  INR  --  1.4* 1.5* 1.4*  --   --     Anemia/dark stool Gastric and duodenal ulcers likely due to Motrin: Hemoglobin drifted slightly, given she recently underwent EGD evaluation 10/29 that shows gastric and duodenal ulcer likely source of his  abdominal pain melena and drop in hemoglobin as well as worsening of his hyperbilirubinemia as per GI.  Continue with Protonix twice daily and repeat EGD in 8 weeks as outpatient.  Hemoglobin is stable 8.8 g since yesterday. Recent Labs  Lab 02/12/21 1903 02/13/21 0752 02/14/21 0150 02/15/21 0210 02/16/21 0819  HGB 10.2* 9.0* 8.7* 8.7* 8.8*  HCT 29.5* 26.5* 24.6* 25.1* 26.4*   ARF: In the setting of NSAID use, renal function  improved to baseline.  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 Last etoh on Saturday.  Watch for withdrawal continue thiamine folate and CIWA scale Ativan  Recent right knee septic arthritis in June completed antibiotics.  Aspiration done by ID currently no evidence of septic arthritis gram stain culture no growth so far.  Continue pain control and have outpatient follow-up with orthopedics.   Hypokalemia improved.   Hyponatremia likely from cirrhosis.  Monitor Obesity class I:Patient's Body mass index is 32.17 kg/m. :  Will benefit with weight loss PCP follow-up.  Consults: GI  Subjective: Alert awake oriented x3.  Looking forward to going home today, has sister who is living with him. Discharge Exam: Vitals:   02/16/21 0903 02/16/21 0919  BP:  106/79  Pulse: (!) 58 (!) 59  Resp:  18  Temp:  98.4 F (36.9 C)  SpO2:  99%   General: Pt is alert, awake, not in acute distress Cardiovascular: RRR, S1/S2 +, no rubs, no gallops Respiratory: CTA bilaterally, no wheezing, no rhonchi Abdominal: Soft, NT, ND, bowel sounds + Extremities: no edema, no cyanosis  Discharge Instructions  Discharge Instructions     Discharge instructions   Complete by: As directed    Check your lab and liver test in 3 to 5 days from your primary care doctor.  Follow-up with gastroenterology regarding your liver dysfunction  Do not drink any alcohol as it may cause further liver damage that could lead to liver failure  Please call call MD or return to ER for similar or worsening recurring problem that brought you to hospital or if any fever,nausea/vomiting,abdominal pain, uncontrolled pain, chest pain,  shortness of breath or any other alarming symptoms.  Please follow-up your doctor as instructed in a week time  and call the office for appointment.  Please avoid alcohol, smoking, or any other illicit substance and maintain healthy habits including taking your regular medications as prescribed.  You were cared for by a hospitalist during your hospital stay. If you have any questions about your discharge medications or the care you received while you were in the hospital after you are discharged, you can call the unit and ask to speak with the hospitalist on call if the hospitalist that took care of you is not available.  Once you are discharged, your primary care physician will handle any further medical issues. Please note that NO REFILLS for any discharge medications will be authorized once you are discharged, as it is imperative that you return to your primary care physician (or establish a relationship with a primary care physician if you do not have one) for your aftercare needs so that they can reassess your need for medications and monitor your lab values   Increase activity slowly   Complete by: As directed  No wound care   Complete by: As directed       Allergies as of 02/16/2021       Reactions   Amoxicillin Other (See Comments)   Itching around eyes and scalp on amoxicilin   Cephalexin Nausea Only   Upset stomach   Penicillins Rash        Medication List     STOP taking these medications    acetaminophen 325 MG tablet Commonly known as: TYLENOL   ibuprofen 200 MG tablet Commonly known as: ADVIL   neomycin-bacitracin-polymyxin ointment Commonly known as: NEOSPORIN   oxyCODONE 5 MG immediate release tablet Commonly known as: Oxy IR/ROXICODONE       TAKE these medications    folic acid 1 MG tablet Commonly known as: FOLVITE Take 1 tablet (1 mg total) by mouth daily. Start taking on: February 17, 2021   multivitamin tablet Take 1 tablet by mouth daily.   pantoprazole 40 MG tablet Commonly known as: PROTONIX Take 1 tablet (40 mg total) by mouth 2 (two) times  daily.   propranolol 20 MG tablet Commonly known as: INDERAL Take 1 tablet (20 mg total) by mouth 2 (two) times daily.   thiamine 100 MG tablet Take 1 tablet (100 mg total) by mouth daily. Start taking on: February 17, 2021        Follow-up Information     [facility] Follow up.   Contact information: [email or web address] Chess, MD Follow up in 3 day(s).   Specialty: Internal Medicine Why: Recheck your labs hemoglobin liver test Contact information: [address] [place] [address] [phone]         Jenel Lucks, MD Follow up in 1 week(s).   Specialty: Gastroenterology Contact information: [address] [place] [address] [phone]                Allergies  Allergen Reactions   Amoxicillin Other (See Comments)    Itching around eyes and scalp on amoxicilin   Cephalexin Nausea Only    Upset stomach   Penicillins Rash    The results of significant diagnostics from this hospitalization (including imaging, microbiology, ancillary and laboratory) are listed below for reference.    Microbiology: Recent Results (from the past 240 hour(s))  Resp Panel by RT-PCR (Flu A&B, Covid) Nasopharyngeal Swab     Status: None   Collection Time: 02/13/21  3:43 AM   Specimen: Nasopharyngeal Swab; Nasopharyngeal(NP) swabs in vial transport medium  Result Value Ref Range Status   SARS Coronavirus 2 by RT PCR NEGATIVE NEGATIVE Final    Comment: (NOTE) SARS-CoV-2 target nucleic acids are NOT DETECTED.  The SARS-CoV-2 RNA is generally detectable in upper respiratory specimens during the acute phase of infection. The lowest concentration of SARS-CoV-2 viral copies this assay can detect is 138 copies/mL. A negative result does not preclude SARS-Cov-2 infection and should not be used as the sole basis for treatment or other patient management decisions. A negative result may occur with  improper specimen  collection/handling, submission of specimen other than nasopharyngeal swab, presence of viral mutation(s) within the areas targeted by this assay, and inadequate number of viral copies(<138 copies/mL). A negative result must be combined with clinical observations, patient history, and epidemiological information. The expected result is Negative.  Fact Sheet for Patients:  BloggerCourse.com  Fact Sheet for Healthcare Providers:  SeriousBroker.it  This test is no t yet approved or cleared by  the [employer] and  has been authorized for detection and/or diagnosis of SARS-CoV-2 by FDA under an Emergency Use Authorization (EUA). This EUA will remain  in effect (meaning this test can be used) for the duration of the COVID-19 declaration under Section 564(b)(1) of the Act, 21 U.S.C.section 360bbb-3(b)(1), unless the authorization is terminated  or revoked sooner.       Influenza A by PCR NEGATIVE NEGATIVE Final   Influenza B by PCR NEGATIVE NEGATIVE Final    Comment: (NOTE) The Xpert Xpress SARS-CoV-2/FLU/RSV plus assay is intended as an aid in the diagnosis of influenza from Nasopharyngeal swab specimens and should not be used as a sole basis for treatment. Nasal washings and aspirates are unacceptable for Xpert Xpress SARS-CoV-2/FLU/RSV testing.  Fact Sheet for Patients: BloggerCourse.com  Fact Sheet for Healthcare Providers: SeriousBroker.it  This test is not yet approved or cleared by the [place] FDA and has been authorized for detection and/or diagnosis of SARS-CoV-2 by FDA under an Emergency Use Authorization (EUA). This EUA will remain in effect (meaning this test can be used) for the duration of the COVID-19 declaration under Section 564(b)(1) of the Act, 21 U.S.C. section 360bbb-3(b)(1), unless the authorization is terminated or revoked.  Performed at [facility], [address], [place], [place] [address]   Body fluid culture w Gram Stain     Status: None   Collection Time: 02/13/21  5:32 PM   Specimen: Body Fluid  Result Value Ref Range Status   Specimen Description FLUID RIGHT KNEE  Final   Special Requests NONE  Final   Gram Stain   Final    MODERATE WBC PRESENT, PREDOMINANTLY MONONUCLEAR NO ORGANISMS SEEN    Culture   Final    NO GROWTH 3 DAYS Performed at [facility], [address] [place], [place] [address]    Report Status 02/16/2021 FINAL  Final    Procedures/Studies: CT ABDOMEN PELVIS WO CONTRAST  Result Date: 02/13/2021 CLINICAL DATA:  Abdominal pain.  Concern for biliary obstruction. EXAM: CT ABDOMEN AND PELVIS WITHOUT CONTRAST TECHNIQUE: Multidetector CT imaging of the abdomen and pelvis was performed following the standard protocol without IV contrast. COMPARISON:  None. FINDINGS: Evaluation of this exam is limited in the absence of intravenous contrast. Lower chest: The visualized lung bases are clear. There is coronary vascular calcification. There is hypoattenuation of the cardiac blood pool suggestive of anemia. Clinical correlation is recommended. No intra-abdominal free air.  Small ascites. Hepatobiliary: Fatty liver with morphologic changes of cirrhosis. No intrahepatic biliary ductal dilatation. Probable sludge within the gallbladder. Pancreas: Unremarkable. No pancreatic ductal dilatation or surrounding inflammatory changes. Spleen: Normal in size without focal abnormality. Adrenals/Urinary Tract: The adrenal glands unremarkable. The kidneys, visualized ureters, and urinary bladder are unremarkable. Stomach/Bowel: There is no bowel obstruction or active inflammation. No CT evidence of acute appendicitis. Vascular/Lymphatic: Mild aortoiliac atherosclerotic disease. The IVC is unremarkable. No portal gas. There is no adenopathy. Reproductive: The prostate and seminal vesicles are grossly unremarkable. No  pelvic mass. Other: None Musculoskeletal: Extensive degenerative changes of the spine. No acute osseous pathology. IMPRESSION: 1. No acute intra-abdominal or pelvic pathology. No bowel obstruction. 2. Fatty liver with morphologic changes of cirrhosis. Small ascites. 3. Aortic Atherosclerosis (ICD10-I70.0). Electronically Signed   By: Elgie Collard M.D.   On: 02/13/2021 02:42   CT KNEE RIGHT WO CONTRAST  Result Date: 02/13/2021 CLINICAL DATA:  Knee instability, prior surgery on right knee due to osteomyelitis EXAM: CT  OF THE right KNEE WITHOUT CONTRAST TECHNIQUE: Multidetector CT imaging of the right knee was performed according to the standard protocol. Multiplanar CT image reconstructions were also generated. COMPARISON:  Partially visualized on prior MRI in June 2022, radiograph 09/27/2020 FINDINGS: Bones/Joint/Cartilage There is tricompartment osteophyte formation, subchondral sclerosis, and cystic change. There is severe medial and lateral joint space narrowing, which appears progressed since the prior radiograph. There is marked articular surface irregularity in the medial and lateral compartments. There is articular surface collapse measuring up to 6 mm deep and 2.7 x 1.8 cm wide along the periphery of the lateral tibial plateau (coronal image 41, sagittal image 31). There is subchondral lucency along the lateral femoral condyle anteriorly. There is the appearance of focal bony erosion at the far posterolateral aspect of the lateral tibial plateau (sagittal image 8, coronal image 55, and similar erosive change at the anterior aspect of the fibular head at the tibiofibular joint. These osseous changes are not clearly seen on prior MRI, though the knee is partially imaged on that exam. There is a small to moderate size knee joint effusion, which appears somewhat heterogeneous. Ligaments Suboptimally assessed by CT. Muscles and Tendons No muscle atrophy. An ovoid collection just posterior to the fibula  which appears to course between the popliteus and soleus muscles measuring up to 2.8 x 2.2 x 4.2 cm (series 4, image 53, coronal image 62). Soft tissues There is diffuse soft tissue swelling along the knee. IMPRESSION: Findings are suspicious for ongoing septic arthritis of the right knee. Recommend knee MRI and joint aspiration. Bones: Severe progressive arthritis with marked articular surface irregularity, erosive changes at the far posterolateral aspect of the lateral tibial plateau and fibular head at the tibiofibular joint. New depressed lateral tibial plateau fracture with 6 mm articular surface depression, which could potentially be pathologic due to underlying infection. Fluid/Soft tissues: Small to moderate-sized joint effusion which appears heterogeneous. Possible fluid collection extending from the posterolateral knee joint inferiorly between the popliteus and soleus muscles which measures 2.8 x 2.2 x 4.2 cm. Electronically Signed   By: Caprice Renshaw M.D.   On: 02/13/2021 08:48   IR ABDOMEN US LIMITED  Result Date: 02/14/2021 CLINICAL DATA:  60 year old male presenting for paracentesis. EXAM: LIMITED ABDOMEN ULTRASOUND FOR ASCITES TECHNIQUE: Limited ultrasound survey for ascites was performed in all four abdominal quadrants. COMPARISON:  CT abdomen pelvis from 02/13/2021 FINDINGS: Trace lower abdominal ascites.  No safe window for paracentesis. IMPRESSION: Trace ascites.  No paracentesis performed. Marliss Coots, MD [facility] Electronically Signed   By: Marliss Coots M.D.   On: 02/14/2021 07:24   US Abdomen Limited RUQ (LIVER/GB)  Result Date: 02/12/2021 CLINICAL DATA:  Jaundice. EXAM: ULTRASOUND ABDOMEN LIMITED RIGHT UPPER QUADRANT COMPARISON:  None. FINDINGS: Gallbladder: There is sludge within the gallbladder. The gallbladder wall is diffusely thickened measuring up to 5 mm. There is a small pericholecystic fluid. This may be related  to underlying liver disease, given negative sonographic Murphy's sign. Acute cholecystitis is not entirely excluded. Common bile duct: Diameter: 4 mm Liver: Diffuse increased liver echogenicity with nodular contour consistent with cirrhosis. Portal vein is patent on color Doppler imaging with normal direction of blood flow towards the liver. Other: Small perihepatic sinus. IMPRESSION: 1. Cirrhosis and small ascites. 2. Patent main portal vein with hepatopetal flow. 3. Gallbladder sludge. Diffuse thickened appearance of the gallbladder wall, likely related to underlying liver disease. Clinical correlation is recommended. Electronically Signed   By: Burtis Junes  Radparvar M.D.   On: 02/12/2021 23:32    Labs: BNP (last 3 results) No results for input(s): BNP in the last 8760 hours. Basic Metabolic Panel: Recent Labs  Lab 02/12/21 1903 02/13/21 0752 02/14/21 0150 02/15/21 0210 02/16/21 0819  NA 128* 128* 131* 132* 131*  K 3.4* 3.2* 3.7 3.4* 3.7  CL 99 98 103 106 104  CO2 20* 21* 22 20* 22  GLUCOSE 90 82 106* 98 100*  BUN 35* 34* 24* 15 16  CREATININE 2.69* 1.88* 1.14 0.85 1.00  CALCIUM 8.2* 7.9* 8.4* 8.4* 8.6*   Liver Function Tests: Recent Labs  Lab 02/12/21 1903 02/13/21 0752 02/14/21 0150 02/15/21 0210 02/16/21 0819  AST 155* 131* 104* 102* 100*  ALT 52* 46* 38 41 39  ALKPHOS 273* 260* 223* 211* 215*  BILITOT 23.7* 21.0* 22.3* 21.0* 22.8*  PROT 7.5 6.8 7.0 6.6 7.0  ALBUMIN 2.2* 1.9* 2.6* 2.5* 2.3*   Recent Labs  Lab 02/12/21 1903  LIPASE 98*   No results for input(s): AMMONIA in the last 168 hours. CBC: Recent Labs  Lab 02/12/21 1903 02/13/21 0752 02/14/21 0150 02/15/21 0210 02/16/21 0819  WBC 9.8 7.7 7.1 7.4 7.0  NEUTROABS  --  4.8  --   --   --   HGB 10.2* 9.0* 8.7* 8.7* 8.8*  HCT 29.5* 26.5* 24.6* 25.1* 26.4*  MCV 103.9* 103.9* 101.7* 105.0* 107.3*  PLT 185 183 176 201 226   Cardiac Enzymes: No results for input(s): CKTOTAL, CKMB, CKMBINDEX, TROPONINI in the  last 168 hours. BNP: Invalid input(s): POCBNP CBG: No results for input(s): GLUCAP in the last 168 hours. D-Dimer No results for input(s): DDIMER in the last 72 hours. Hgb A1c No results for input(s): HGBA1C in the last 72 hours. Lipid Profile No results for input(s): CHOL, HDL, LDLCALC, TRIG, CHOLHDL, LDLDIRECT in the last 72 hours. Thyroid function studies No results for input(s): TSH, T4TOTAL, T3FREE, THYROIDAB in the last 72 hours.  Invalid input(s): FREET3 Anemia work up No results for input(s): VITAMINB12, FOLATE, FERRITIN, TIBC, IRON, RETICCTPCT in the last 72 hours. Urinalysis    Component Value Date/Time   COLORURINE BROWN (A) 02/12/2021 1915   APPEARANCEUR TURBID (A) 02/12/2021 1915   LABSPEC 1.025 02/12/2021 1915   PHURINE 6.5 02/12/2021 1915   GLUCOSEU 100 (A) 02/12/2021 1915   HGBUR SMALL (A) 02/12/2021 1915   BILIRUBINUR NEGATIVE 02/12/2021 1915   KETONESUR NEGATIVE 02/12/2021 1915   PROTEINUR 100 (A) 02/12/2021 1915   NITRITE NEGATIVE 02/12/2021 1915   LEUKOCYTESUR TRACE (A) 02/12/2021 1915   Sepsis Labs Invalid input(s): PROCALCITONIN,  WBC,  LACTICIDVEN Microbiology Recent Results (from the past 240 hour(s))  Resp Panel by RT-PCR (Flu A&B, Covid) Nasopharyngeal Swab     Status: None   Collection Time: 02/13/21  3:43 AM   Specimen: Nasopharyngeal Swab; Nasopharyngeal(NP) swabs in vial transport medium  Result Value Ref Range Status   SARS Coronavirus 2 by RT PCR NEGATIVE NEGATIVE Final    Comment: (NOTE) SARS-CoV-2 target nucleic acids are NOT DETECTED.  The SARS-CoV-2 RNA is generally detectable in upper respiratory specimens during the acute phase of infection. The lowest concentration of SARS-CoV-2 viral copies this assay can detect is 138 copies/mL. A negative result does not preclude SARS-Cov-2 infection and should not be used as the sole basis for treatment or other patient management decisions. A negative result may occur with  improper  specimen collection/handling, submission of specimen other than nasopharyngeal swab, presence of viral mutation(s) within the areas  targeted by this assay, and inadequate number of viral copies(<138 copies/mL). A negative result must be combined with clinical observations, patient history, and epidemiological information. The expected result is Negative.  Fact Sheet for Patients:  BloggerCourse.com  Fact Sheet for Healthcare Providers:  SeriousBroker.it  This test is no t yet approved or cleared by the [place] FDA and  has been authorized for detection and/or diagnosis of SARS-CoV-2 by FDA under an Emergency Use Authorization (EUA). This EUA will remain  in effect (meaning this test can be used) for the duration of the COVID-19 declaration under Section 564(b)(1) of the Act, 21 U.S.C.section 360bbb-3(b)(1), unless the authorization is terminated  or revoked sooner.       Influenza A by PCR NEGATIVE NEGATIVE Final   Influenza B by PCR NEGATIVE NEGATIVE Final    Comment: (NOTE) The Xpert Xpress SARS-CoV-2/FLU/RSV plus assay is intended as an aid in the diagnosis of influenza from Nasopharyngeal swab specimens and should not be used as a sole basis for treatment. Nasal washings and aspirates are unacceptable for Xpert Xpress SARS-CoV-2/FLU/RSV testing.  Fact Sheet for Patients: BloggerCourse.com  Fact Sheet for Healthcare Providers: SeriousBroker.it  This test is not yet approved or cleared by the [place] FDA and has been authorized for detection and/or diagnosis of SARS-CoV-2 by FDA under an Emergency Use Authorization (EUA). This EUA will remain in effect (meaning this test can be used) for the duration of the COVID-19 declaration under Section 564(b)(1) of the Act, 21 U.S.C. section 360bbb-3(b)(1), unless the authorization is terminated or revoked.  Performed at  [facility], [address] [place], [place] [address]   Body fluid culture w Gram Stain     Status: None   Collection Time: 02/13/21  5:32 PM   Specimen: Body Fluid  Result Value Ref Range Status   Specimen Description FLUID RIGHT KNEE  Final   Special Requests NONE  Final   Gram Stain   Final    MODERATE WBC PRESENT, PREDOMINANTLY MONONUCLEAR NO ORGANISMS SEEN    Culture   Final    NO GROWTH 3 DAYS Performed at [facility], [address] [place], [place] [address]    Report Status 02/16/2021 FINAL  Final     Time coordinating discharge: 35 minutes  SIGNED: Lanae Boast, MD  [facility] 02/16/2021, 10:36 AM  If 7PM-7AM, please contact night-coverage [email or web address]

## 2021-02-16 NOTE — Plan of Care (Signed)

## 2021-02-17 ENCOUNTER — Encounter (HOSPITAL_COMMUNITY): Payer: Self-pay | Admitting: Gastroenterology

## 2021-02-17 LAB — ANTINUCLEAR ANTIBODIES, IFA: ANA Ab, IFA: NEGATIVE

## 2021-02-19 LAB — SURGICAL PATHOLOGY

## 2021-02-24 ENCOUNTER — Encounter: Payer: Self-pay | Admitting: Gastroenterology

## 2021-03-15 ENCOUNTER — Encounter (HOSPITAL_COMMUNITY): Payer: Self-pay

## 2021-03-15 ENCOUNTER — Emergency Department (HOSPITAL_COMMUNITY): Payer: Medicaid Other

## 2021-03-15 ENCOUNTER — Inpatient Hospital Stay (HOSPITAL_COMMUNITY)
Admission: EM | Admit: 2021-03-15 | Discharge: 2021-03-22 | DRG: 432 | Disposition: A | Discharge status: Home / Self Care | Payer: Medicaid Other | Attending: Internal Medicine | Admitting: Internal Medicine

## 2021-03-15 ENCOUNTER — Encounter (HOSPITAL_COMMUNITY): Payer: Self-pay | Admitting: Emergency Medicine

## 2021-03-15 ENCOUNTER — Other Ambulatory Visit: Payer: Self-pay

## 2021-03-15 DIAGNOSIS — D6959 Other secondary thrombocytopenia: Secondary | ICD-10-CM | POA: Diagnosis present

## 2021-03-15 DIAGNOSIS — R188 Other ascites: Secondary | ICD-10-CM

## 2021-03-15 DIAGNOSIS — D696 Thrombocytopenia, unspecified: Secondary | ICD-10-CM | POA: Diagnosis not present

## 2021-03-15 DIAGNOSIS — K767 Hepatorenal syndrome: Secondary | ICD-10-CM | POA: Diagnosis present

## 2021-03-15 DIAGNOSIS — E871 Hypo-osmolality and hyponatremia: Secondary | ICD-10-CM | POA: Diagnosis present

## 2021-03-15 DIAGNOSIS — T39395A Adverse effect of other nonsteroidal anti-inflammatory drugs [NSAID], initial encounter: Secondary | ICD-10-CM | POA: Diagnosis present

## 2021-03-15 DIAGNOSIS — Z8249 Family history of ischemic heart disease and other diseases of the circulatory system: Secondary | ICD-10-CM

## 2021-03-15 DIAGNOSIS — Z881 Allergy status to other antibiotic agents status: Secondary | ICD-10-CM

## 2021-03-15 DIAGNOSIS — Z20822 Contact with and (suspected) exposure to covid-19: Secondary | ICD-10-CM | POA: Diagnosis present

## 2021-03-15 DIAGNOSIS — E876 Hypokalemia: Secondary | ICD-10-CM | POA: Diagnosis not present

## 2021-03-15 DIAGNOSIS — D689 Coagulation defect, unspecified: Secondary | ICD-10-CM | POA: Diagnosis not present

## 2021-03-15 DIAGNOSIS — R7989 Other specified abnormal findings of blood chemistry: Secondary | ICD-10-CM

## 2021-03-15 DIAGNOSIS — D539 Nutritional anemia, unspecified: Secondary | ICD-10-CM | POA: Diagnosis present

## 2021-03-15 DIAGNOSIS — Z841 Family history of disorders of kidney and ureter: Secondary | ICD-10-CM

## 2021-03-15 DIAGNOSIS — K7682 Hepatic encephalopathy: Secondary | ICD-10-CM | POA: Diagnosis present

## 2021-03-15 DIAGNOSIS — R4182 Altered mental status, unspecified: Secondary | ICD-10-CM | POA: Diagnosis present

## 2021-03-15 DIAGNOSIS — Z6834 Body mass index (BMI) 34.0-34.9, adult: Secondary | ICD-10-CM

## 2021-03-15 DIAGNOSIS — Z823 Family history of stroke: Secondary | ICD-10-CM | POA: Diagnosis not present

## 2021-03-15 DIAGNOSIS — K7011 Alcoholic hepatitis with ascites: Secondary | ICD-10-CM | POA: Diagnosis present

## 2021-03-15 DIAGNOSIS — K259 Gastric ulcer, unspecified as acute or chronic, without hemorrhage or perforation: Secondary | ICD-10-CM

## 2021-03-15 DIAGNOSIS — E872 Acidosis, unspecified: Secondary | ICD-10-CM | POA: Diagnosis not present

## 2021-03-15 DIAGNOSIS — K254 Chronic or unspecified gastric ulcer with hemorrhage: Secondary | ICD-10-CM | POA: Diagnosis present

## 2021-03-15 DIAGNOSIS — Z79899 Other long term (current) drug therapy: Secondary | ICD-10-CM

## 2021-03-15 DIAGNOSIS — E43 Unspecified severe protein-calorie malnutrition: Secondary | ICD-10-CM | POA: Diagnosis present

## 2021-03-15 DIAGNOSIS — I8510 Secondary esophageal varices without bleeding: Secondary | ICD-10-CM | POA: Diagnosis present

## 2021-03-15 DIAGNOSIS — K31811 Angiodysplasia of stomach and duodenum with bleeding: Secondary | ICD-10-CM | POA: Diagnosis present

## 2021-03-15 DIAGNOSIS — K269 Duodenal ulcer, unspecified as acute or chronic, without hemorrhage or perforation: Secondary | ICD-10-CM | POA: Diagnosis present

## 2021-03-15 DIAGNOSIS — R609 Edema, unspecified: Secondary | ICD-10-CM | POA: Diagnosis not present

## 2021-03-15 DIAGNOSIS — K766 Portal hypertension: Secondary | ICD-10-CM | POA: Diagnosis present

## 2021-03-15 DIAGNOSIS — K7031 Alcoholic cirrhosis of liver with ascites: Secondary | ICD-10-CM | POA: Diagnosis present

## 2021-03-15 DIAGNOSIS — Z880 Allergy status to penicillin: Secondary | ICD-10-CM

## 2021-03-15 DIAGNOSIS — F1020 Alcohol dependence, uncomplicated: Secondary | ICD-10-CM | POA: Diagnosis present

## 2021-03-15 DIAGNOSIS — N179 Acute kidney failure, unspecified: Secondary | ICD-10-CM | POA: Diagnosis present

## 2021-03-15 DIAGNOSIS — D62 Acute posthemorrhagic anemia: Secondary | ICD-10-CM | POA: Diagnosis not present

## 2021-03-15 LAB — CBC WITH DIFFERENTIAL/PLATELET
Abs Immature Granulocytes: 0.1 10*3/uL — ABNORMAL HIGH (ref 0.00–0.07)
Basophils Absolute: 0.1 10*3/uL (ref 0.0–0.1)
Basophils Relative: 1 %
Eosinophils Absolute: 0.3 10*3/uL (ref 0.0–0.5)
Eosinophils Relative: 3 %
HCT: 23.4 % — ABNORMAL LOW (ref 39.0–52.0)
Hemoglobin: 7.9 g/dL — ABNORMAL LOW (ref 13.0–17.0)
Immature Granulocytes: 1 %
Lymphocytes Relative: 14 %
Lymphs Abs: 1.4 10*3/uL (ref 0.7–4.0)
MCH: 35.9 pg — ABNORMAL HIGH (ref 26.0–34.0)
MCHC: 33.8 g/dL (ref 30.0–36.0)
MCV: 106.4 fL — ABNORMAL HIGH (ref 80.0–100.0)
Monocytes Absolute: 1.1 10*3/uL — ABNORMAL HIGH (ref 0.1–1.0)
Monocytes Relative: 11 %
Neutro Abs: 7.1 10*3/uL (ref 1.7–7.7)
Neutrophils Relative %: 70 %
Platelets: 129 10*3/uL — ABNORMAL LOW (ref 150–400)
RBC: 2.2 MIL/uL — ABNORMAL LOW (ref 4.22–5.81)
RDW: 15 % (ref 11.5–15.5)
WBC: 10 10*3/uL (ref 4.0–10.5)
nRBC: 0 % (ref 0.0–0.2)

## 2021-03-15 LAB — CBC
HCT: 21.4 % — ABNORMAL LOW (ref 39.0–52.0)
Hemoglobin: 7.4 g/dL — ABNORMAL LOW (ref 13.0–17.0)
MCH: 36.1 pg — ABNORMAL HIGH (ref 26.0–34.0)
MCHC: 34.6 g/dL (ref 30.0–36.0)
MCV: 104.4 fL — ABNORMAL HIGH (ref 80.0–100.0)
Platelets: 123 10*3/uL — ABNORMAL LOW (ref 150–400)
RBC: 2.05 MIL/uL — ABNORMAL LOW (ref 4.22–5.81)
RDW: 15 % (ref 11.5–15.5)
WBC: 8.4 10*3/uL (ref 4.0–10.5)
nRBC: 0 % (ref 0.0–0.2)

## 2021-03-15 LAB — URINALYSIS, ROUTINE W REFLEX MICROSCOPIC
Glucose, UA: NEGATIVE mg/dL
Hgb urine dipstick: NEGATIVE
Ketones, ur: NEGATIVE mg/dL
Leukocytes,Ua: NEGATIVE
Nitrite: NEGATIVE
Protein, ur: NEGATIVE mg/dL
Specific Gravity, Urine: 1.015 (ref 1.005–1.030)
pH: 5 (ref 5.0–8.0)

## 2021-03-15 LAB — COMPREHENSIVE METABOLIC PANEL
ALT: 51 U/L — ABNORMAL HIGH (ref 0–44)
AST: 140 U/L — ABNORMAL HIGH (ref 15–41)
Albumin: 1.7 g/dL — ABNORMAL LOW (ref 3.5–5.0)
Alkaline Phosphatase: 221 U/L — ABNORMAL HIGH (ref 38–126)
Anion gap: 7 (ref 5–15)
BUN: 56 mg/dL — ABNORMAL HIGH (ref 6–20)
CO2: 21 mmol/L — ABNORMAL LOW (ref 22–32)
Calcium: 8.4 mg/dL — ABNORMAL LOW (ref 8.9–10.3)
Chloride: 103 mmol/L (ref 98–111)
Creatinine, Ser: 4.69 mg/dL — ABNORMAL HIGH (ref 0.61–1.24)
GFR, Estimated: 14 mL/min — ABNORMAL LOW (ref >60)
Glucose, Bld: 103 mg/dL — ABNORMAL HIGH (ref 70–99)
Potassium: 3.8 mmol/L (ref 3.5–5.1)
Sodium: 131 mmol/L — ABNORMAL LOW (ref 135–145)
Total Bilirubin: 11.9 mg/dL — ABNORMAL HIGH (ref 0.3–1.2)
Total Protein: 7.5 g/dL (ref 6.5–8.1)

## 2021-03-15 LAB — CBG MONITORING, ED: Glucose-Capillary: 82 mg/dL (ref 70–99)

## 2021-03-15 LAB — AMMONIA: Ammonia: 153 umol/L — ABNORMAL HIGH (ref 9–35)

## 2021-03-15 LAB — RESP PANEL BY RT-PCR (FLU A&B, COVID) ARPGX2
Influenza A by PCR: NEGATIVE
Influenza B by PCR: NEGATIVE
SARS Coronavirus 2 by RT PCR: NEGATIVE

## 2021-03-15 LAB — CREATININE, URINE, RANDOM: Creatinine, Urine: 301.57 mg/dL

## 2021-03-15 LAB — SODIUM, URINE, RANDOM: Sodium, Ur: <10 mmol/L

## 2021-03-15 LAB — PROTIME-INR
INR: 1.5 — ABNORMAL HIGH (ref 0.8–1.2)
Prothrombin Time: 18.4 seconds — ABNORMAL HIGH (ref 11.4–15.2)

## 2021-03-15 LAB — OSMOLALITY, URINE: Osmolality, Ur: 393 mOsm/kg (ref 300–900)

## 2021-03-15 MED ORDER — ALBUMIN HUMAN 25 % IV SOLN
12.5000 g | Freq: Once | INTRAVENOUS | Status: AC
Start: 2021-03-15 — End: 2021-03-15
  Administered 2021-03-15: 12.5 g via INTRAVENOUS
  Filled 2021-03-15: qty 50

## 2021-03-15 MED ORDER — FUROSEMIDE 10 MG/ML IJ SOLN
40.0000 mg | Freq: Once | INTRAMUSCULAR | Status: AC
  Administered 2021-03-15: 40 mg via INTRAVENOUS
  Filled 2021-03-15: qty 4

## 2021-03-15 MED ORDER — SODIUM CHLORIDE 0.9 % IV SOLN
50.0000 ug/h | INTRAVENOUS | Status: AC
  Administered 2021-03-15 – 2021-03-18 (×7): 50 ug/h via INTRAVENOUS
  Filled 2021-03-15 (×9): qty 1

## 2021-03-15 MED ORDER — CIPROFLOXACIN IN D5W 200 MG/100ML IV SOLN: 200.0000 mg | INTRAVENOUS | Status: DC

## 2021-03-15 MED ORDER — OCTREOTIDE LOAD VIA INFUSION
50.0000 ug | Freq: Once | INTRAVENOUS | Status: AC
  Administered 2021-03-15: 50 ug via INTRAVENOUS
  Filled 2021-03-15: qty 25

## 2021-03-15 MED ORDER — LACTULOSE 10 GM/15ML PO SOLN: 30.0000 g | Freq: Three times a day (TID) | ORAL | Status: DC

## 2021-03-15 MED ORDER — ONDANSETRON HCL 4 MG PO TABS: 4.0000 mg | ORAL_TABLET | Freq: Four times a day (QID) | ORAL | Status: DC | PRN

## 2021-03-15 MED ORDER — ONDANSETRON HCL 4 MG/2ML IJ SOLN: 4.0000 mg | Freq: Four times a day (QID) | INTRAMUSCULAR | Status: DC | PRN

## 2021-03-15 MED ORDER — LACTULOSE 10 GM/15ML PO SOLN
30.0000 g | ORAL | Status: DC
  Administered 2021-03-15 – 2021-03-17 (×8): 30 g via ORAL
  Filled 2021-03-15: qty 60
  Filled 2021-03-15 (×8): qty 45

## 2021-03-15 MED ORDER — ALBUMIN HUMAN 25 % IV SOLN
50.0000 g | Freq: Two times a day (BID) | INTRAVENOUS | Status: AC
  Administered 2021-03-15 – 2021-03-18 (×6): 50 g via INTRAVENOUS
  Filled 2021-03-15 (×6): qty 200

## 2021-03-15 MED ORDER — LACTULOSE 10 GM/15ML PO SOLN
30.0000 g | Freq: Once | ORAL | Status: AC
  Administered 2021-03-15: 30 g via ORAL
  Filled 2021-03-15: qty 60

## 2021-03-15 MED ORDER — SODIUM CHLORIDE 0.9% FLUSH
3.0000 mL | Freq: Two times a day (BID) | INTRAVENOUS | Status: DC
  Administered 2021-03-15 – 2021-03-21 (×11): 3 mL via INTRAVENOUS

## 2021-03-15 MED ORDER — ALBUTEROL SULFATE (2.5 MG/3ML) 0.083% IN NEBU: 2.5000 mg | INHALATION_SOLUTION | Freq: Four times a day (QID) | RESPIRATORY_TRACT | Status: DC | PRN

## 2021-03-15 MED ORDER — SODIUM CHLORIDE 0.9 % IV SOLN
1.0000 g | INTRAVENOUS | Status: DC
  Administered 2021-03-15 – 2021-03-16 (×2): 1 g via INTRAVENOUS
  Filled 2021-03-15 (×2): qty 10

## 2021-03-15 MED ORDER — PANTOPRAZOLE SODIUM 40 MG PO TBEC
40.0000 mg | DELAYED_RELEASE_TABLET | Freq: Two times a day (BID) | ORAL | Status: DC
  Administered 2021-03-15 – 2021-03-20 (×11): 40 mg via ORAL
  Filled 2021-03-15 (×11): qty 1

## 2021-03-15 NOTE — ED Notes (Signed)
Patient transported to X-ray 

## 2021-03-15 NOTE — H&P (Addendum)
History and Physical    SOU AHMAD [id number] DOB: December 31, 1960 DOA: 03/15/2021  Referring MD/NP/PA: Franchot Heidelberg, PA-C PCP: Ricard Dillon, MD  Patient coming from: Home (lives with sister) via EMS  Chief Complaint: Confusion  I have personally briefly reviewed patient's old medical records in [place]   HPI: Kyle Pitts is a 60 y.o. male with medical history significant of alcoholic cirrhosis with esophageal varices and septic arthritis of right knee who presents after being noted to be more confused by sister this morning.  History is limited from patient as he is confused and his nephew who is present at bedside tries to provide additional history.  He had just recently been hospitalized 10/26-10/30 with likely alcoholic cirrhosis.  Patient had undergone EGD on 10/29 noting grade 2 esophageal varices along with gastric and duodenal ulcers.  Since getting home patient reports that since getting home he reports to continued use of Mobic although appears to have been recommended to discontinue NSAIDs during his last hospitalization.  Last drink was reported prior to previous hospitalization last month.  He has reported dark stools.  His nephew reports that the patient did not urinate yesterday and felt like he could not urinate today, but has been unable to do so.  His sister noted that he was talking abnormal this morning for which she called EMS.  ED Course: Upon admission into the emergency department patient was seen to be afebrile with vital signs otherwise stable.  Labs significant for hemoglobin 7.9, MCV 106.4, MCH 35.9, sodium 131, BUN 56, creatinine 4.69, glucose 103, alkaline phosphatase 221, albumin 1.7, AST 140, ALT 51, total bilirubin 11.9, ammonia 153, and INR 1.5.  Chest x-ray noted low lung volumes with mild bibasilar atelectasis.  CT scan of the abdomen and pelvis noted significant progression of ascites with generalized soft tissue edema consistent with  anasarca with liver noted to be decreased in size with improve steatosis and mild nonspecific right colonic wall thickening.  COVID-19 and influenza screening were negative.  Patient had been given lactulose 30 g x 1 dose.  TRH called to admit.  Review of Systems  Unable to perform ROS: Mental status change  Gastrointestinal:  Positive for melena.  Genitourinary:        Positive for decreased urine output  Neurological:  Positive for speech change.   Past Medical History:  Diagnosis Date   Allergic reaction 11/21/2020   Cirrhosis ([place])    Hyponatremia    Myofasciitis 11/21/2020   Osteomyelitis of right leg ([place]) 11/21/2020   Septic arthritis ([place])    Streptococcus infection, group A 11/21/2020    Past Surgical History:  Procedure Laterality Date   BIOPSY  02/15/2021   Procedure: BIOPSY;  Surgeon: Daryel November, MD;  Location: [facility] ENDOSCOPY;  Service: Gastroenterology;;   ESOPHAGOGASTRODUODENOSCOPY N/A 02/15/2021   Procedure: ESOPHAGOGASTRODUODENOSCOPY (EGD);  Surgeon: Daryel November, MD;  Location: [facility];  Service: Gastroenterology;  Laterality: N/A;   I & D EXTREMITY Right 10/09/2020   Procedure: IRRIGATION AND DEBRIDEMENT EXTREMITY;  Surgeon: Nicholes Stairs, MD;  Location: [facility] ORS;  Service: Orthopedics;  Laterality: Right;   KNEE ARTHROSCOPY Right 09/27/2020   Procedure: ARTHROSCOPY KNEE, [place];  Surgeon: Melina Schools, MD;  Location: [facility] ORS;  Service: Orthopedics;  Laterality: Right;   KNEE ARTHROSCOPY Right 10/09/2020   Procedure: ARTHROSCOPY KNEE,ARTHROSCOPIC I & D, CALF ABSCESS;  Surgeon: Nicholes Stairs, MD;  Location: [facility] OR[facility];  Service: Orthopedics;  Laterality: Right;  reports that he has never smoked. He has never used smokeless tobacco. He reports current alcohol use of about 24.0 standard drinks per week. He reports that he does not currently use drugs after having used the following drugs: Cocaine and Marijuana.  Allergies  Allergen  Reactions   Amoxicillin Other (See Comments)    Itching around eyes and scalp on amoxicilin   Cephalexin Nausea Only    Upset stomach   Penicillins Rash    Family History  Problem Relation Age of Onset   Hepatitis Mother    Cirrhosis Mother    Stroke Father    Heart failure Father     Prior to Admission medications   Medication Sig Start Date End Date Taking? Authorizing Provider  folic acid (FOLVITE) 1 MG tablet Take 1 tablet (1 mg total) by mouth daily. 02/17/21 03/19/21  Antonieta Pert, MD  Multiple Vitamin (MULTIVITAMIN) tablet Take 1 tablet by mouth daily.    [provider]  pantoprazole (PROTONIX) 40 MG tablet Take 1 tablet (40 mg total) by mouth 2 (two) times daily. 02/16/21 04/17/21  Antonieta Pert, MD  propranolol (INDERAL) 20 MG tablet Take 1 tablet (20 mg total) by mouth 2 (two) times daily. 02/16/21 03/18/21  Antonieta Pert, MD  thiamine 100 MG tablet Take 1 tablet (100 mg total) by mouth daily. 02/17/21 03/19/21  Antonieta Pert, MD    Physical Exam:  Constitutional: Middle-age male who appears to be acutely altered and has difficulty Vitals:   03/15/21 1245 03/15/21 1300 03/15/21 1315 03/15/21 1330  BP: 118/85 117/85 116/85 125/74  Pulse: 63 62 62 (!) 58  Resp: 15 12 (!) 21 20  Temp:      TempSrc:      SpO2: 100% 100% 100% 100%   Eyes: PERRL, sclera icterus appreciated ENMT: Mucous membranes are moist. Posterior pharynx clear of any exudate or lesions.   Neck: normal, supple, no masses, no thyromegaly Respiratory: Normal respiratory effort with positive crackles heard in bilateral bases.  O2 saturations currently maintained on room air Cardiovascular: Regular rate and rhythm, no murmurs / rubs / gallops.  3+ pitting bilateral lower extremity edema with edema going up into the abdomen. Abdomen: Protuberant abdomen with positive fluid wave.   Bowel sounds positive.  Musculoskeletal: no clubbing / cyanosis. No joint bowel sounds present deformity upper and lower  extremities. Good ROM, no contractures. Normal muscle tone.  Skin: Jaundiced with spider angiomas noted of the upper chest. Neurologic: CN 2-12 grossly intact.  Able to move all extremities Psychiatric: Altered oriented to self.    Labs on Admission: I have personally reviewed following labs and imaging studies  CBC: Recent Labs  Lab 03/15/21 0958  WBC 10.0  NEUTROABS 7.1  HGB 7.9*  HCT 23.4*  MCV 106.4*  PLT [id number]*   Basic Metabolic Panel: Recent Labs  Lab 03/15/21 0958  NA 131*  K 3.8  CL 103  CO2 21*  GLUCOSE 103*  BUN 56*  CREATININE 4.69*  CALCIUM 8.4*   GFR: CrCl cannot be calculated (Unknown ideal weight.). Liver Function Tests: Recent Labs  Lab 03/15/21 0958  AST 140*  ALT 51*  ALKPHOS 221*  BILITOT 11.9*  PROT 7.5  ALBUMIN 1.7*   No results for input(s): LIPASE, AMYLASE in the last 168 hours. Recent Labs  Lab 03/15/21 0958  AMMONIA 153*   Coagulation Profile: Recent Labs  Lab 03/15/21 1239  INR 1.5*   Cardiac Enzymes: No results for input(s): CKTOTAL, CKMB, CKMBINDEX, TROPONINI in the  last 168 hours. BNP (last 3 results) No results for input(s): PROBNP in the last 8760 hours. HbA1C: No results for input(s): HGBA1C in the last 72 hours. CBG: Recent Labs  Lab 03/15/21 1013  GLUCAP 82   Lipid Profile: No results for input(s): CHOL, HDL, LDLCALC, TRIG, CHOLHDL, LDLDIRECT in the last 72 hours. Thyroid Function Tests: No results for input(s): TSH, T4TOTAL, FREET4, T3FREE, THYROIDAB in the last 72 hours. Anemia Panel: No results for input(s): VITAMINB12, FOLATE, FERRITIN, TIBC, IRON, RETICCTPCT in the last 72 hours. Urine analysis:    Component Value Date/Time   COLORURINE BROWN (A) 02/12/2021 1915   APPEARANCEUR TURBID (A) 02/12/2021 1915   LABSPEC 1.025 02/12/2021 1915   PHURINE 6.5 02/12/2021 1915   GLUCOSEU 100 (A) 02/12/2021 1915   HGBUR SMALL (A) 02/12/2021 [place] 02/12/2021 [place]  02/12/2021 1915   PROTEINUR 100 (A) 02/12/2021 1915   NITRITE NEGATIVE 02/12/2021 1915   LEUKOCYTESUR TRACE (A) 02/12/2021 1915   Sepsis Labs: Recent Results (from the past 240 hour(s))  Resp Panel by RT-PCR (Flu A&B, Covid) Nasopharyngeal Swab     Status: None   Collection Time: 03/15/21 10:14 AM   Specimen: Nasopharyngeal Swab; Nasopharyngeal(NP) swabs in vial transport medium  Result Value Ref Range Status   SARS Coronavirus 2 by RT PCR NEGATIVE NEGATIVE Final    Comment: (NOTE) SARS-CoV-2 target nucleic acids are NOT DETECTED.  The SARS-CoV-2 RNA is generally detectable in upper respiratory specimens during the acute phase of infection. The lowest concentration of SARS-CoV-2 viral copies this assay can detect is 138 copies/mL. A negative result does not preclude SARS-Cov-2 infection and should not be used as the sole basis for treatment or other patient management decisions. A negative result may occur with  improper specimen collection/handling, submission of specimen other than nasopharyngeal swab, presence of viral mutation(s) within the areas targeted by this assay, and inadequate number of viral copies(<138 copies/mL). A negative result must be combined with clinical observations, patient history, and epidemiological information. The expected result is Negative.  Fact Sheet for Patients:  EntrepreneurPulse.com.au  Fact Sheet for Healthcare Providers:  IncredibleEmployment.be  This test is no t yet approved or cleared by the [place] FDA and  has been authorized for detection and/or diagnosis of SARS-CoV-2 by FDA under an Emergency Use Authorization (EUA). This EUA will remain  in effect (meaning this test can be used) for the duration of the COVID-19 declaration under Section 564(b)(1) of the Act, 21 U.S.C.section 360bbb-3(b)(1), unless the authorization is terminated  or revoked sooner.       Influenza A by PCR NEGATIVE  NEGATIVE Final   Influenza B by PCR NEGATIVE NEGATIVE Final    Comment: (NOTE) The Xpert Xpress SARS-CoV-2/FLU/RSV plus assay is intended as an aid in the diagnosis of influenza from Nasopharyngeal swab specimens and should not be used as a sole basis for treatment. Nasal washings and aspirates are unacceptable for Xpert Xpress SARS-CoV-2/FLU/RSV testing.  Fact Sheet for Patients: EntrepreneurPulse.com.au  Fact Sheet for Healthcare Providers: IncredibleEmployment.be  This test is not yet approved or cleared by the [place] FDA and has been authorized for detection and/or diagnosis of SARS-CoV-2 by FDA under an Emergency Use Authorization (EUA). This EUA will remain in effect (meaning this test can be used) for the duration of the COVID-19 declaration under Section 564(b)(1) of the Act, 21 U.S.C. section 360bbb-3(b)(1), unless the authorization is terminated or revoked.  Performed at [facility],  [address] [place], [place] [address]      Radiological Exams on Admission: CT ABDOMEN PELVIS WO CONTRAST  Result Date: 03/15/2021 CLINICAL DATA:  Edema in the abdomen and extremities for 1 month. History of cirrhosis. Jaundice and abdominal pain. EXAM: CT ABDOMEN AND PELVIS WITHOUT CONTRAST TECHNIQUE: Multidetector CT imaging of the abdomen and pelvis was performed following the standard protocol without IV contrast. COMPARISON:  Abdominopelvic CT 02/13/2021. FINDINGS: Lower chest: Images through the lung bases are mildly degraded by breathing artifact. There is mildly increased atelectasis in the right middle lobe. No significant pleural or pericardial effusion. Coronary artery atherosclerosis noted. Hepatobiliary: Again demonstrated are changes of advanced cirrhosis with diffuse contour irregularity of the liver and relative enlargement of the caudate and left lobes. No focal abnormalities are identified on noncontrast imaging. Compared  with the recent study, the liver has decreased in size, and previously demonstrated diffuse steatosis as improved. Probable mild dependent sludge in the gallbladder lumen. No discrete gallstones, gallbladder wall thickening or biliary dilatation. Pancreas: Unremarkable. No pancreatic ductal dilatation or surrounding inflammatory changes. Spleen: Normal in size without focal abnormality. Adrenals/Urinary Tract: Both adrenal glands appear normal. Both kidneys appear unremarkable. No evidence of urinary tract calculus or hydronephrosis. The bladder appears normal. Stomach/Bowel: No enteric contrast administered. The stomach appears unremarkable for its degree of distention. Mild nonspecific right colonic wall thickening, likely related to cirrhosis and ascites. No evidence of bowel distension or surrounding focal inflammation. Vascular/Lymphatic: There are no enlarged abdominal or pelvic lymph nodes. Mild aortic and branch vessel atherosclerosis. Reproductive: The prostate gland and seminal vesicles appear unremarkable. Other: Interval significant increase in volume of ascites, now moderate to large. In addition, there is generalized soft tissue edema throughout the subcutaneous and intra-fat. No focal extraluminal fluid or air collections are seen. Musculoskeletal: No acute or significant osseous findings. Severe multilevel thoracolumbar spondylosis with multilevel disc and endplate degeneration, grossly unchanged. There is the potential for nerve root encroachment, especially within the L5-S1 foramina bilaterally. IMPRESSION: 1. Significant progression of ascites with generalized soft tissue edema consistent with anasarca, likely related to cirrhosis and hepatic dysfunction. 2. Over the last month, the liver has decreased in size with improved steatosis. 3. Mild nonspecific right colonic wall thickening, likely related to liver disease/cirrhosis. 4.  Aortic Atherosclerosis (ICD10-I70.0). Electronically Signed   By:  Carey Bullocks M.D.   On: 03/15/2021 12:31   DG Chest 2 View  Result Date: 03/15/2021 CLINICAL DATA:  Altered mental status. EXAM: CHEST - 2 VIEW COMPARISON:  10/07/2020 chest radiograph FINDINGS: Cardiomediastinal silhouette is unremarkable. This is a low volume study with mild bibasilar atelectasis. There is no evidence of focal airspace disease, pulmonary edema, suspicious pulmonary nodule/mass, pleural effusion, or pneumothorax. No acute bony abnormalities are identified. IMPRESSION: Low volume study with mild bibasilar atelectasis. Electronically Signed   By: Harmon Pier M.D.   On: 03/15/2021 12:13    EKG: Independently reviewed.  Appears more so to be sinus rhythm at 60 bpm  Assessment/Plan Hepatic encephalopathy secondary to decompensated alcoholic cirrhosis with ascites: Acute.  Patient presents after being noted to be acutely altered.  Found to have ammonia elevated at 156.  Just recently hospitalized for suspected alcoholic cirrhosis.  Had extensive work-up GI for which alcoholic hepatitis with thought to be less likely by GI.  Noted to have autoimmune markers-IgG elevated to 2342, normal ceruloplasmin hep B surface antigen alpha antitrypsin, pending antimitochondrial antibody ANA anti-smooth muscle antibody.  Underwent EGD evaluation 10/29 noted to have  gastric and duodenal ulcers.  During his last hospitalization he was not noted to be a candidate for transplant given recent alcohol use.  Patient has been given lactulose 30 g p.o. Patient's MELD score is 35 with a 52.6% estimated 85-month mortality. -Admit to a telemetry bed -N.p.o. except for sips with meds -Lactulose 30 g 3 times daily was increased to every 4 hours per GI recommendation -Continue propranolol as tolerated -Ultrasound-guided paracentesis -Albumin 25% and give Lasix 40 mg IV x1 dose -GI consulted, will follow-up for any further recommendations -Rocephin IV started for SBP prophylaxis  Acute renal failure: Patient  reports no urine output in the last 1 to 2 days.  Creatinine of 4.69 with BUN 56.  Baseline creatinine previously had been around 1 and discharged on 10/30.  Question if related to patient's Mobic use versus hepatorenal syndrome -Strict I&Os -Avoid nephrotoxic agents -Nephrology consulted, we will follow-up for any further recommendations  Transaminitis and hyperbilirubinemia: Chronic. Alkaline phosphatase 221,  AST 140, ALT 51, and total bilirubin 11.9.  Total bilirubin appears to be improved from previous 22.8. -Continue to monitor  Macrocytic anemia secondary to suspect GI bleed: Acute on chronic.  Patient reports having dark stools at home prior to coming in and admitted to still using Mobic.  Last EGD from 10/29 noted gastric and duodenal ulcers.  Hemoglobin on admission noted to be 7.9 with elevated MCV and MCH.  Previously had been 8.8 on discharge on 10/30. -Type and screen for possible need of blood prior -Check stool guaiac -GI on board and started patient on octreotide given history of esophageal varices  Thrombocytopenia: Acute.  Platelet count 129 on admission. -Continue to monitor  Hyponatremia: Acute on chronic.  Suspected secondary to cirrhosis. -Continue to monitor  Severe protein calorie malnutrition: On admission albumin noted to be low at 1.7. -Add on prealbumin  Gastric/duodenal ulcers: Underwent EGD evaluation 10/29 that shows gastric and duodenal ulcer  -Continue Protonix   DVT prophylaxis: SCDs due to concern for bleeding Code Status: Full Family Communication: Nephew updated at bedside Disposition Plan: To be determined Consults called: GI and nephrology Admission status: Inpatient require more than 2 midnight stay  Norval Morton MD [facility]   If 7PM-7AM, please contact night-coverage   03/15/2021, 1:56 PM

## 2021-03-15 NOTE — ED Provider Notes (Signed)
[facility] EMERGENCY DEPARTMENT Provider Note   CSN: [id number] Arrival date & time: 03/15/21  6160     History Chief Complaint  Patient presents with   Altered Mental Status    Kyle Pitts is a 60 y.o. male presenting for evaluation of altered mental status.  Level 5 caveat due to AMS.  Patient unable to participate at all in the history.  Per sister via phone, who patient lives with, patient has been confused intermittently for the past week, and last night symptoms worsened.  She also reports of the last 2 weeks, he has had worsening swelling, making it hard for him to breathe.  Sister states patient was admitted to the hospital just over a month ago, diagnosed with alcoholic cirrhosis and jaundice.  Patient has been sober for over a month now.  Jaundice has not improved, but also not worsened.   HPI     Past Medical History:  Diagnosis Date   Allergic reaction 11/21/2020   Cirrhosis (HCC)    Hyponatremia    Myofasciitis 11/21/2020   Osteomyelitis of right leg (HCC) 11/21/2020   Septic arthritis (HCC)    Streptococcus infection, group A 11/21/2020    Patient Active Problem List   Diagnosis Date Noted   Alcoholic cirrhosis of liver with ascites (HCC)    Esophageal varices in alcoholic cirrhosis (HCC)    Gastric ulcer due to nonsteroidal anti-inflammatory drug (NSAID)    Duodenal ulcer    Portal hypertensive gastropathy (HCC)    Acute blood loss anemia    ARF (acute renal failure) (HCC) 02/13/2021   Elevated LFTs 02/13/2021   Allergic reaction 11/21/2020   Osteomyelitis of right leg (HCC) 11/21/2020   Streptococcus infection, group A 11/21/2020   Myofasciitis 11/21/2020   PICC line infection 11/08/2020   Anemia 11/08/2020   Hyponatremia 10/07/2020   Leucocytosis 10/07/2020   Cellulitis and abscess of right leg 10/07/2020   Septic arthritis of knee (HCC) 09/28/2020   ALLERGIC RHINITIS 01/13/2007   ASTHMA 01/13/2007    Past Surgical  History:  Procedure Laterality Date   BIOPSY  02/15/2021   Procedure: BIOPSY;  Surgeon: Jenel Lucks, MD;  Location: [facility] ENDOSCOPY;  Service: Gastroenterology;;   ESOPHAGOGASTRODUODENOSCOPY N/A 02/15/2021   Procedure: ESOPHAGOGASTRODUODENOSCOPY (EGD);  Surgeon: Jenel Lucks, MD;  Location: [facility] ENDOSCOPY;  Service: Gastroenterology;  Laterality: N/A;   I & D EXTREMITY Right 10/09/2020   Procedure: IRRIGATION AND DEBRIDEMENT EXTREMITY;  Surgeon: Yolonda Kida, MD;  Location: [facility] ORS;  Service: Orthopedics;  Laterality: Right;   KNEE ARTHROSCOPY Right 09/27/2020   Procedure: ARTHROSCOPY KNEE, WASH OUT;  Surgeon: Venita Lick, MD;  Location: [facility] ORS;  Service: Orthopedics;  Laterality: Right;   KNEE ARTHROSCOPY Right 10/09/2020   Procedure: ARTHROSCOPY KNEE,ARTHROSCOPIC I & D, CALF ABSCESS;  Surgeon: Yolonda Kida, MD;  Location: [facility] ORS;  Service: Orthopedics;  Laterality: Right;       Family History  Problem Relation Age of Onset   Hepatitis Mother    Cirrhosis Mother    Stroke Father    Heart failure Father     Social History   Tobacco Use   Smoking status: Never   Smokeless tobacco: Never  Vaping Use   Vaping Use: Never used  Substance Use Topics   Alcohol use: Yes    Alcohol/week: 24.0 standard drinks    Types: 24 Cans of beer per week    Comment: 3-4 when home from work   Drug use: Not  Currently    Types: Cocaine, Marijuana    Comment: last used 1 month ago    Home Medications Prior to Admission medications   Medication Sig Start Date End Date Taking? Authorizing Provider  folic acid (FOLVITE) 1 MG tablet Take 1 tablet (1 mg total) by mouth daily. 02/17/21 03/19/21  Lanae Boast, MD  Multiple Vitamin (MULTIVITAMIN) tablet Take 1 tablet by mouth daily.    [provider]  pantoprazole (PROTONIX) 40 MG tablet Take 1 tablet (40 mg total) by mouth 2 (two) times daily. 02/16/21 04/17/21  Lanae Boast, MD  propranolol (INDERAL) 20 MG tablet Take  1 tablet (20 mg total) by mouth 2 (two) times daily. 02/16/21 03/18/21  Lanae Boast, MD  thiamine 100 MG tablet Take 1 tablet (100 mg total) by mouth daily. 02/17/21 03/19/21  Lanae Boast, MD    Allergies    Amoxicillin, Cephalexin, and Penicillins  Review of Systems   Review of Systems  Unable to perform ROS: Mental status change  Cardiovascular:  Positive for leg swelling.  Gastrointestinal:  Positive for abdominal distention.  Psychiatric/Behavioral:  Positive for confusion.    Physical Exam Updated Vital Signs BP 125/74   Pulse (!) 58   Temp 98.6 F (37 C) (Oral)   Resp 20   SpO2 100%   Physical Exam Vitals and nursing note reviewed.  HENT:     Head: Normocephalic and atraumatic.  Eyes:     General: Scleral icterus present.  Cardiovascular:     Rate and Rhythm: Normal rate and regular rhythm.     Pulses: Normal pulses.  Pulmonary:     Effort: Pulmonary effort is normal. No respiratory distress.     Breath sounds: Normal breath sounds. No wheezing.     Comments: Speaking in full sentences.  Clear lung sounds in all fields. Abdominal:     General: There is distension.     Tenderness: There is abdominal tenderness.  Musculoskeletal:        General: Swelling present.     Cervical back: Normal range of motion and neck supple.     Comments: Bilateral pitting edema, will right greater than left.  Temperature and skin color changes from the right to the left, however pedal pulses dopplerable bilaterally (unable to palpate due to edema) Wound of the right anterior shin, appears improved from pictures.  Skin:    General: Skin is warm and dry.     Coloration: Skin is jaundiced.  Neurological:     Mental Status: He is confused.     GCS: GCS eye subscore is 4. GCS verbal subscore is 5. GCS motor subscore is 6.     Comments: A&Ox2.   Psychiatric:        Mood and Affect: Mood and affect normal.        Speech: Speech normal.        Behavior: Behavior normal.    ED Results /  Procedures / Treatments   Labs (all labs ordered are listed, but only abnormal results are displayed) Labs Reviewed  CBC WITH DIFFERENTIAL/PLATELET - Abnormal; Notable for the following components:      Result Value   RBC 2.20 (*)    Hemoglobin 7.9 (*)    HCT 23.4 (*)    MCV 106.4 (*)    MCH 35.9 (*)    Platelets 129 (*)    Monocytes Absolute 1.1 (*)    Abs Immature Granulocytes 0.10 (*)    All other components within normal limits  COMPREHENSIVE METABOLIC PANEL - Abnormal; Notable for the following components:   Sodium 131 (*)    CO2 21 (*)    Glucose, Bld 103 (*)    BUN 56 (*)    Creatinine, Ser 4.69 (*)    Calcium 8.4 (*)    Albumin 1.7 (*)    AST 140 (*)    ALT 51 (*)    Alkaline Phosphatase 221 (*)    Total Bilirubin 11.9 (*)    GFR, Estimated 14 (*)    All other components within normal limits  AMMONIA - Abnormal; Notable for the following components:   Ammonia 153 (*)    All other components within normal limits  PROTIME-INR - Abnormal; Notable for the following components:   Prothrombin Time 18.4 (*)    INR 1.5 (*)    All other components within normal limits  RESP PANEL BY RT-PCR (FLU A&B, COVID) ARPGX2  URINALYSIS, ROUTINE W REFLEX MICROSCOPIC  CBG MONITORING, ED    EKG None  Radiology CT ABDOMEN PELVIS WO CONTRAST  Result Date: 03/15/2021 CLINICAL DATA:  Edema in the abdomen and extremities for 1 month. History of cirrhosis. Jaundice and abdominal pain. EXAM: CT ABDOMEN AND PELVIS WITHOUT CONTRAST TECHNIQUE: Multidetector CT imaging of the abdomen and pelvis was performed following the standard protocol without IV contrast. COMPARISON:  Abdominopelvic CT 02/13/2021. FINDINGS: Lower chest: Images through the lung bases are mildly degraded by breathing artifact. There is mildly increased atelectasis in the right middle lobe. No significant pleural or pericardial effusion. Coronary artery atherosclerosis noted. Hepatobiliary: Again demonstrated are changes of  advanced cirrhosis with diffuse contour irregularity of the liver and relative enlargement of the caudate and left lobes. No focal abnormalities are identified on noncontrast imaging. Compared with the recent study, the liver has decreased in size, and previously demonstrated diffuse steatosis as improved. Probable mild dependent sludge in the gallbladder lumen. No discrete gallstones, gallbladder wall thickening or biliary dilatation. Pancreas: Unremarkable. No pancreatic ductal dilatation or surrounding inflammatory changes. Spleen: Normal in size without focal abnormality. Adrenals/Urinary Tract: Both adrenal glands appear normal. Both kidneys appear unremarkable. No evidence of urinary tract calculus or hydronephrosis. The bladder appears normal. Stomach/Bowel: No enteric contrast administered. The stomach appears unremarkable for its degree of distention. Mild nonspecific right colonic wall thickening, likely related to cirrhosis and ascites. No evidence of bowel distension or surrounding focal inflammation. Vascular/Lymphatic: There are no enlarged abdominal or pelvic lymph nodes. Mild aortic and branch vessel atherosclerosis. Reproductive: The prostate gland and seminal vesicles appear unremarkable. Other: Interval significant increase in volume of ascites, now moderate to large. In addition, there is generalized soft tissue edema throughout the subcutaneous and intra-fat. No focal extraluminal fluid or air collections are seen. Musculoskeletal: No acute or significant osseous findings. Severe multilevel thoracolumbar spondylosis with multilevel disc and endplate degeneration, grossly unchanged. There is the potential for nerve root encroachment, especially within the L5-S1 foramina bilaterally. IMPRESSION: 1. Significant progression of ascites with generalized soft tissue edema consistent with anasarca, likely related to cirrhosis and hepatic dysfunction. 2. Over the last month, the liver has decreased in  size with improved steatosis. 3. Mild nonspecific right colonic wall thickening, likely related to liver disease/cirrhosis. 4.  Aortic Atherosclerosis (ICD10-I70.0). Electronically Signed   By: Carey Bullocks M.D.   On: 03/15/2021 12:31   DG Chest 2 View  Result Date: 03/15/2021 CLINICAL DATA:  Altered mental status. EXAM: CHEST - 2 VIEW COMPARISON:  10/07/2020 chest radiograph FINDINGS: Cardiomediastinal silhouette is unremarkable. This  is a low volume study with mild bibasilar atelectasis. There is no evidence of focal airspace disease, pulmonary edema, suspicious pulmonary nodule/mass, pleural effusion, or pneumothorax. No acute bony abnormalities are identified. IMPRESSION: Low volume study with mild bibasilar atelectasis. Electronically Signed   By: Harmon Pier M.D.   On: 03/15/2021 12:13    Procedures Procedures   Medications Ordered in ED Medications  lactulose (CHRONULAC) 10 GM/15ML solution 30 g (30 g Oral Given 03/15/21 1239)    ED Course  I have reviewed the triage vital signs and the nursing notes.  Pertinent labs & imaging results that were available during my care of the patient were reviewed by me and considered in my medical decision making (see chart for details).   MDM Rules/Calculators/A&P                           Patient presenting for evaluation of confusion.  On exam, patient appears ill.  He is alert to person and place, but not event or time.  He is clearly jaundiced.  He has pitting edema and distention of the abdomen, most consistent with ascites.  He is afebrile and, while he has diffuse abdominal pain, doubt SBP.  Labs obtained from triage interpreted by me, shows anemia, likely due to chronic disease.  CMP shows improvement of LFTs, however significant increase in patient's creatinine at 4.69.  Additionally, patient's ammonia is elevated at 153, likely the cause of his confusion.  In the setting of new AKI, will obtain CT abdomen pelvis without.  X-ray to rule  out pulmonary concerns.  Will give lactulose for confusion and hepatic encephalopathy.  CT shows ascites c/w cirrhosis. No obstructive pattern. CXR viewed and independently interpreted by me, no pneumonia, pnx, effusion. Pt will need to be admitted.   Discussed with Dr. Katrinka Blazing from [facility] hospitalist service, patient to be admitted.   Final Clinical Impression(s) / ED Diagnoses Final diagnoses:  Hepatic encephalopathy  AKI (acute kidney injury) (HCC)  Alcoholic cirrhosis of liver with ascites [facility])    Rx / DC Orders ED Discharge Orders     None        Alveria Apley, PA-C 03/15/21 1358    Melene Plan, DO 03/15/21 1506

## 2021-03-15 NOTE — ED Notes (Signed)
Sister Marcio Hoque [phone] would like an update

## 2021-03-15 NOTE — Consult Note (Signed)
Reason for Consult: Acute kidney injury, anasarca Referring Physician: Madelyn Flavors, MD [facility])  HPI: (History obtained from chart review/admitting physician as patient is encephalopathic) 60 year old man with past medical history significant for recent admission to the hospital 1 month ago when he was diagnosed with alcoholic cirrhosis with portal hypertensive gastropathy, upper GI bleed with gastric and duodenal ulcers as well as esophageal varices, prior history of right knee septic arthritis treated with arthroscopic debridement/fasciotomy and prolonged antibiotics.  He appears to have suffered acute kidney injury last month noted during his admission associated with use of NSAIDs and had a normal creatinine at the time of discharge from the hospital.  He was brought into the emergency room today by EMS after family members found him to have progressive altered mental status over the last 24 to 48 hours along with minimal to no urine output.  There is no reported constitutional complains of fevers or chills and other than black stools with ongoing use of meloxicam, no other significant history obtained.  When I saw the patient, he was urinating into a urometer and had collected about 150 cc of tea colored urine.  Evaluation in the emergency room shows acute rise of creatinine to 4.7 from 1.0 about 4 weeks ago, BUN of 56, sodium of 131, elevated transaminases with a total bilirubin of 11.9 and ammonia of 153.  CT scan of the abdomen and pelvis showed significant progression of ascites with generalized soft tissue edema consistent with anasarca and the liver was noted to be decreased in size with improved steatosis and mild nonspecific colonic wall thickening.  No hydronephrosis was seen.  His medications were reviewed and significant for not taking a diuretic or RAAS blockers.  Past Medical History:  Diagnosis Date   Allergic reaction 11/21/2020   Cirrhosis (HCC)    Hyponatremia    Myofasciitis  11/21/2020   Osteomyelitis of right leg (HCC) 11/21/2020   Septic arthritis (HCC)    Streptococcus infection, group A 11/21/2020    Past Surgical History:  Procedure Laterality Date   BIOPSY  02/15/2021   Procedure: BIOPSY;  Surgeon: Jenel Lucks, MD;  Location: [facility] ENDOSCOPY;  Service: Gastroenterology;;   ESOPHAGOGASTRODUODENOSCOPY N/A 02/15/2021   Procedure: ESOPHAGOGASTRODUODENOSCOPY (EGD);  Surgeon: Jenel Lucks, MD;  Location: [facility] ENDOSCOPY;  Service: Gastroenterology;  Laterality: N/A;   I & D EXTREMITY Right 10/09/2020   Procedure: IRRIGATION AND DEBRIDEMENT EXTREMITY;  Surgeon: Yolonda Kida, MD;  Location: [facility] ORS;  Service: Orthopedics;  Laterality: Right;   KNEE ARTHROSCOPY Right 09/27/2020   Procedure: ARTHROSCOPY KNEE, WASH OUT;  Surgeon: Venita Lick, MD;  Location: [facility] ORS;  Service: Orthopedics;  Laterality: Right;   KNEE ARTHROSCOPY Right 10/09/2020   Procedure: ARTHROSCOPY KNEE,ARTHROSCOPIC I & D, CALF ABSCESS;  Surgeon: Yolonda Kida, MD;  Location: [facility] ORS;  Service: Orthopedics;  Laterality: Right;    Family History  Problem Relation Age of Onset   Hepatitis Mother    Cirrhosis Mother    Stroke Father    Heart failure Father    Kidney failure Brother     Social History:  reports that he has never smoked. He has never used smokeless tobacco. He reports current alcohol use of about 24.0 standard drinks per week. He reports that he does not currently use drugs after having used the following drugs: Cocaine and Marijuana.  Allergies:  Allergies  Allergen Reactions   Amoxicillin Other (See Comments)    Itching around eyes and scalp on amoxicilin  Cephalexin Nausea Only    Upset stomach   Penicillins Rash    Medications: I have reviewed the patient's current medications. Scheduled:  lactulose  30 g Oral Q4H   pantoprazole  40 mg Oral BID   sodium chloride flush  3 mL Intravenous Q12H   BMP Latest Ref Rng & Units 03/15/2021  02/16/2021 02/15/2021  Glucose 70 - 99 mg/dL 103(H) 100(H) 98  BUN 6 - 20 mg/dL 56(H) 16 15  Creatinine 0.61 - 1.24 mg/dL 4.69(H) 1.00 0.85  BUN/Creat Ratio 6 - 22 (calc) - - -  Sodium 135 - 145 mmol/L 131(L) 131(L) 132(L)  Potassium 3.5 - 5.1 mmol/L 3.8 3.7 3.4(L)  Chloride 98 - 111 mmol/L 103 104 106  CO2 22 - 32 mmol/L 21(L) 22 20(L)  Calcium 8.9 - 10.3 mg/dL 8.4(L) 8.6(L) 8.4(L)   CBC Latest Ref Rng & Units 03/15/2021 02/16/2021 02/15/2021  WBC 4.0 - 10.5 K/uL 10.0 7.0 7.4  Hemoglobin 13.0 - 17.0 g/dL 7.9(L) 8.8(L) 8.7(L)  Hematocrit 39.0 - 52.0 % 23.4(L) 26.4(L) 25.1(L)  Platelets 150 - 400 K/uL 129(L) 226 201      CT ABDOMEN PELVIS WO CONTRAST  Result Date: 03/15/2021 CLINICAL DATA:  Edema in the abdomen and extremities for 1 month. History of cirrhosis. Jaundice and abdominal pain. EXAM: CT ABDOMEN AND PELVIS WITHOUT CONTRAST TECHNIQUE: Multidetector CT imaging of the abdomen and pelvis was performed following the standard protocol without IV contrast. COMPARISON:  Abdominopelvic CT 02/13/2021. FINDINGS: Lower chest: Images through the lung bases are mildly degraded by breathing artifact. There is mildly increased atelectasis in the right middle lobe. No significant pleural or pericardial effusion. Coronary artery atherosclerosis noted. Hepatobiliary: Again demonstrated are changes of advanced cirrhosis with diffuse contour irregularity of the liver and relative enlargement of the caudate and left lobes. No focal abnormalities are identified on noncontrast imaging. Compared with the recent study, the liver has decreased in size, and previously demonstrated diffuse steatosis as improved. Probable mild dependent sludge in the gallbladder lumen. No discrete gallstones, gallbladder wall thickening or biliary dilatation. Pancreas: Unremarkable. No pancreatic ductal dilatation or surrounding inflammatory changes. Spleen: Normal in size without focal abnormality. Adrenals/Urinary Tract: Both  adrenal glands appear normal. Both kidneys appear unremarkable. No evidence of urinary tract calculus or hydronephrosis. The bladder appears normal. Stomach/Bowel: No enteric contrast administered. The stomach appears unremarkable for its degree of distention. Mild nonspecific right colonic wall thickening, likely related to cirrhosis and ascites. No evidence of bowel distension or surrounding focal inflammation. Vascular/Lymphatic: There are no enlarged abdominal or pelvic lymph nodes. Mild aortic and branch vessel atherosclerosis. Reproductive: The prostate gland and seminal vesicles appear unremarkable. Other: Interval significant increase in volume of ascites, now moderate to large. In addition, there is generalized soft tissue edema throughout the subcutaneous and intra-fat. No focal extraluminal fluid or air collections are seen. Musculoskeletal: No acute or significant osseous findings. Severe multilevel thoracolumbar spondylosis with multilevel disc and endplate degeneration, grossly unchanged. There is the potential for nerve root encroachment, especially within the L5-S1 foramina bilaterally. IMPRESSION: 1. Significant progression of ascites with generalized soft tissue edema consistent with anasarca, likely related to cirrhosis and hepatic dysfunction. 2. Over the last month, the liver has decreased in size with improved steatosis. 3. Mild nonspecific right colonic wall thickening, likely related to liver disease/cirrhosis. 4.  Aortic Atherosclerosis (ICD10-I70.0). Electronically Signed   By: Richardean Sale M.D.   On: 03/15/2021 12:31   DG Chest 2 View  Result Date: 03/15/2021 CLINICAL DATA:  Altered mental status. EXAM: CHEST - 2 VIEW COMPARISON:  10/07/2020 chest radiograph FINDINGS: Cardiomediastinal silhouette is unremarkable. This is a low volume study with mild bibasilar atelectasis. There is no evidence of focal airspace disease, pulmonary edema, suspicious pulmonary nodule/mass, pleural  effusion, or pneumothorax. No acute bony abnormalities are identified. IMPRESSION: Low volume study with mild bibasilar atelectasis. Electronically Signed   By: Margarette Canada M.D.   On: 03/15/2021 12:13    Review of Systems  Unable to perform ROS: Mental status change  Blood pressure 119/79, pulse (!) 59, temperature 98.6 F (37 C), temperature source Oral, resp. rate 11, SpO2 100 %. Physical Exam Vitals and nursing note reviewed.  Constitutional:      Appearance: He is obese. He is ill-appearing and toxic-appearing.  HENT:     Head: Normocephalic and atraumatic.     Right Ear: External ear normal.     Left Ear: External ear normal.     Nose: Nose normal.     Mouth/Throat:     Mouth: Mucous membranes are dry.     Pharynx: Oropharynx is clear.  Eyes:     General: Scleral icterus present.     Extraocular Movements: Extraocular movements intact.     Conjunctiva/sclera: Conjunctivae normal.  Cardiovascular:     Rate and Rhythm: Normal rate and regular rhythm.     Pulses: Normal pulses.     Heart sounds: Normal heart sounds.  Pulmonary:     Breath sounds: Normal breath sounds. No wheezing or rales.     Comments: Poor inspiratory effort with decreased breath sounds over bases Abdominal:     General: Bowel sounds are normal. There is distension.     Comments: Firm moderate to severe global distention of the abdomen  Musculoskeletal:     Cervical back: Normal range of motion and neck supple.     Right lower leg: Edema present.     Left lower leg: Edema present.     Comments: 3+ edema over lower extremities with shallow ulcer right pretibial  Skin:    General: Skin is warm and dry.     Coloration: Skin is jaundiced and pale.  Neurological:     Mental Status: He is disoriented.     Comments: Arouses with ease but unable to engage in meaningful conversation drifting off to sleep    Assessment/Plan: 1. Acute kidney injury: Appears anuric to oliguric on presentation based on history  provided on admission.  Differential diagnoses here include hepatorenal syndrome type I given the abrupt worsening of renal function, intra-abdominal hypertension given the presence of significant ascites and hemodynamically mediated renal injury in the setting of meloxicam use.  I recommend pursuing paracentesis along with administration of intravenous albumin for attempts at increasing effective arterial blood volume.  Initial blood pressures appear to have been soft and if these decrease, would benefit from addition of midodrine to help with systemic vasoconstriction.  I will obtain a urinalysis and urine electrolytes to further guide management.  Discontinue meloxicam and avoid NSAIDs in the future (as he was recommended to do at the time of his last discharge but did not adhere to). Avoid nephrotoxic medications including NSAIDs and iodinated intravenous contrast exposure unless the latter is absolutely indicated.  Preferred narcotic agents for pain control are hydromorphone, fentanyl, and methadone. Morphine should not be used. Avoid Baclofen and avoid oral sodium phosphate and magnesium citrate based laxatives / bowel preps. Continue strict Input and Output monitoring. Will monitor the patient closely with you  and intervene or adjust therapy as indicated by changes in clinical status/labs  2. Hyponatremia: Secondary to cirrhosis and inappropriate ADH activation from decreased effective arterial blood volume.  Monitor with volume expansion using albumin infusion.  Limit oral fluid intake to <1.2 L a day.  We will check serum osmolality and urine osmolality to corroborate clinical suspicion. 3.  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  Less likely to have an azotemic component contributing to encephalopathy but not entirely ruled out. 4.  Anemia: Likely secondary to cirrhosis with associated GI bleed from  recent gastritis and duodenitis in the setting of ongoing meloxicam use.  Restart PPI. 5.  Thrombocytopenia: Secondary to portal hypertension and splenic sequestration, no evidence of bleeding diathesis.  Chibuike Fleek K. 03/15/2021, 4:12 PM

## 2021-03-15 NOTE — ED Triage Notes (Addendum)
Pt to triage via [facility] from home.  Sister reports AMS x 4 days, edema in abd and extremities x 1 month.  Pt denies any complaints.  Pt has some slurred speech.  Diagnosed with cirrhosis 1 month ago.  Pt jaundice.  EMS- 22g L Hand CBG 190 125/85

## 2021-03-15 NOTE — Consult Note (Addendum)
Referring Provider: [facility] PCP: Ricard Dillon, MD  Gastroenterologist: Althia Forts Reason for consultation:   possible upper GI bleed /  cirrhosis           ASSESSMENT / PLAN   #  60 yo male with decompensated cirrhosis, ( presumably Etoh) being admitted with AMS / AKI / probable upper GI bleed with dark stool on exam and reports of dark stool at home,  progressive anemia ( hgb 8.8 >>> 7.9).            --Increasing lactulose to Q4 until mental status improves --Diagnostic tap ( no LVP given renal function) --Antibiotics for SBP prophylaxis. He cannot have Rocephin due to PCN allergy. Giving reduced dose of Cipro after discussing with Nephrology --Eventual repeat EGD.   --Octreotide infusion --Continue IV PPI --Monitor CBC transfuse if needed --Suspect ongoing Etoh given AST / ALT ratio --Nephrology will be seeing patient for AKI. Hopefully this isn't HRS.   ADDENDUM: Pharmacy contacted me. Patient has PCN on allergy list but he has recently received amoxicillin, cefepime and rocephin without problems. Will change SBP prophylaxis to Rocephin.    # Hx of PUD, ongoing Meloxicam use --Continue BID PPI --Eventual follow up EGD  # Additional medical history listed below.   HISTORY OF PRESENT ILLNESS                                                                                                                         Chief Complaint: none from patient  Kyle Pitts is a 60 y.o. male with a past medical history significant for cirrhosis, PUD See PMH for any additional medical problems.   We saw patient in the hospital in October 2022 for abdominal pain and new onset jaundice / Etoh hepatitis.  Imaging suggested cirrhosis with small amount of ascites. Inpatient EGD >>  grade II esophageal varices, non-bleeding or with stigmata of recent bleeding, - Portal hypertensive gastropathy and non-bleeding gastric and duodenal ulcers. He was advised to discontinue NSAIDS. Appears  there was has not yet been  outpatient follow up with [place].   Patient brought to ED today by EMS. Sister reported AMS over last few days. Unable to obtain reliable history from patient due to probable hepatic encephalopathy. Family not present.  [facility] admitting team concerned about GI bleed, patient had reported black stools. He was apparently still be taking Meloxicam.   ED labs:  Na 131, Cr 4.69, alk phos 221, AST 140, ALT 50, Tbili 11.9, ammonia 153. INR 1.5 WBC 7.9, Hgb 7.9 ( 8.8 in late October), platelets 129. Non-con CTAP >> ascites / anasarca.   IMAGING:  CT ABDOMEN PELVIS WO CONTRAST  Result Date: 03/15/2021 CLINICAL DATA:  Edema in the abdomen and extremities for 1 month. History of cirrhosis. Jaundice and abdominal pain. EXAM: CT ABDOMEN AND PELVIS WITHOUT CONTRAST TECHNIQUE: Multidetector CT imaging of the abdomen and pelvis was performed following the standard protocol without IV contrast. COMPARISON:  Abdominopelvic CT 02/13/2021.  FINDINGS: Lower chest: Images through the lung bases are mildly degraded by breathing artifact. There is mildly increased atelectasis in the right middle lobe. No significant pleural or pericardial effusion. Coronary artery atherosclerosis noted. Hepatobiliary: Again demonstrated are changes of advanced cirrhosis with diffuse contour irregularity of the liver and relative enlargement of the caudate and left lobes. No focal abnormalities are identified on noncontrast imaging. Compared with the recent study, the liver has decreased in size, and previously demonstrated diffuse steatosis as improved. Probable mild dependent sludge in the gallbladder lumen. No discrete gallstones, gallbladder wall thickening or biliary dilatation. Pancreas: Unremarkable. No pancreatic ductal dilatation or surrounding inflammatory changes. Spleen: Normal in size without focal abnormality. Adrenals/Urinary Tract: Both adrenal glands appear normal. Both kidneys appear unremarkable. No  evidence of urinary tract calculus or hydronephrosis. The bladder appears normal. Stomach/Bowel: No enteric contrast administered. The stomach appears unremarkable for its degree of distention. Mild nonspecific right colonic wall thickening, likely related to cirrhosis and ascites. No evidence of bowel distension or surrounding focal inflammation. Vascular/Lymphatic: There are no enlarged abdominal or pelvic lymph nodes. Mild aortic and branch vessel atherosclerosis. Reproductive: The prostate gland and seminal vesicles appear unremarkable. Other: Interval significant increase in volume of ascites, now moderate to large. In addition, there is generalized soft tissue edema throughout the subcutaneous and intra-fat. No focal extraluminal fluid or air collections are seen. Musculoskeletal: No acute or significant osseous findings. Severe multilevel thoracolumbar spondylosis with multilevel disc and endplate degeneration, grossly unchanged. There is the potential for nerve root encroachment, especially within the L5-S1 foramina bilaterally. IMPRESSION: 1. Significant progression of ascites with generalized soft tissue edema consistent with anasarca, likely related to cirrhosis and hepatic dysfunction. 2. Over the last month, the liver has decreased in size with improved steatosis. 3. Mild nonspecific right colonic wall thickening, likely related to liver disease/cirrhosis. 4.  Aortic Atherosclerosis (ICD10-I70.0). Electronically Signed   By: Richardean Sale M.D.   On: 03/15/2021 12:31   DG Chest 2 View  Result Date: 03/15/2021 CLINICAL DATA:  Altered mental status. EXAM: CHEST - 2 VIEW COMPARISON:  10/07/2020 chest radiograph FINDINGS: Cardiomediastinal silhouette is unremarkable. This is a low volume study with mild bibasilar atelectasis. There is no evidence of focal airspace disease, pulmonary edema, suspicious pulmonary nodule/mass, pleural effusion, or pneumothorax. No acute bony abnormalities are identified.  IMPRESSION: Low volume study with mild bibasilar atelectasis. Electronically Signed   By: Margarette Canada M.D.   On: 03/15/2021 12:13     PREVIOUS ENDOSCOPIC EVALUATIONS  / IMAGING STUDIES   Oct 2022 A1AT negative Ceruloplasmin negative F-Actin IgG 20 AMA negative Ferritin 832 TIBC 182 / 26 % sat HCV ab negative Hep B surface ab negative  02/15/21 EGD for anemia and cirrhosis --Yellowish discoloration of the pharynx secondary to hyperbilirubinemia. Otherwise the examined portions of the nasopharynx, oropharynx and larynx were normal. - Grade II esophageal varices. No red wale signs. Not banded. - Portal hypertensive gastropathy. - Non-bleeding gastric ulcer with no stigmata of bleeding. Biopsied. - Non-bleeding duodenal ulcer with no stigmata of bleeding. - Non-bleeding duodenal ulcer with no stigmata of bleeding. Biopsied. - Biopsies were taken with a cold forceps for Helicobacter pylori testing. - The patient's abdominal pain, drop in hgb and dark stools can be attributed to bleeding peptic ulcers, likely NSAID-related. The GI bleed also probably contributed to the patients hyperbilirubinemia  FINAL MICROSCOPIC DIAGNOSIS:   A. DUODENUM, ULCER, BIOPSY:  - Duodenal mucosa with inflammation and fibrosis consistent with ulcer.  - No adenomatous  change or carcinoma.   B. STOMACH, ULCER, BIOPSY:  - Gastric mucosa with inflamed granulation tissue consistent with ulcer.  - No intestinal metaplasia, dysplasia or carcinoma.   C. STOMACH, BIOPSY:  - Gastric mucosa with slight inflammation.  - Warthin-Starry negative for Helicobacter pylori.  - No intestinal metaplasia, dysplasia or carcinoma.   Past Medical History:  Diagnosis Date   Allergic reaction 11/21/2020   Cirrhosis ([place])    Hyponatremia    Myofasciitis 11/21/2020   Osteomyelitis of right leg ([place]) 11/21/2020   Septic arthritis ([place])    Streptococcus infection, group A 11/21/2020    Past Surgical History:  Procedure  Laterality Date   BIOPSY  02/15/2021   Procedure: BIOPSY;  Surgeon: Daryel November, MD;  Location: [facility] ENDOSCOPY;  Service: Gastroenterology;;   ESOPHAGOGASTRODUODENOSCOPY N/A 02/15/2021   Procedure: ESOPHAGOGASTRODUODENOSCOPY (EGD);  Surgeon: Daryel November, MD;  Location: [facility];  Service: Gastroenterology;  Laterality: N/A;   I & D EXTREMITY Right 10/09/2020   Procedure: IRRIGATION AND DEBRIDEMENT EXTREMITY;  Surgeon: Nicholes Stairs, MD;  Location: [facility];  Service: Orthopedics;  Laterality: Right;   KNEE ARTHROSCOPY Right 09/27/2020   Procedure: ARTHROSCOPY KNEE, [place];  Surgeon: Melina Schools, MD;  Location: [facility];  Service: Orthopedics;  Laterality: Right;   KNEE ARTHROSCOPY Right 10/09/2020   Procedure: ARTHROSCOPY KNEE,ARTHROSCOPIC I & D, CALF ABSCESS;  Surgeon: Nicholes Stairs, MD;  Location: [facility];  Service: Orthopedics;  Laterality: Right;    Prior to Admission medications   Medication Sig Start Date End Date Taking? Authorizing Provider  folic acid (FOLVITE) 1 MG tablet Take 1 tablet (1 mg total) by mouth daily. 02/17/21 03/19/21 Yes Antonieta Pert, MD  meloxicam (MOBIC) 7.5 MG tablet Take 7.5 mg by mouth daily.   Yes [provider]  Multiple Vitamin (MULTIVITAMIN) tablet Take 1 tablet by mouth daily.   Yes [provider]  pantoprazole (PROTONIX) 40 MG tablet Take 1 tablet (40 mg total) by mouth 2 (two) times daily. 02/16/21 04/17/21 Yes Antonieta Pert, MD  propranolol (INDERAL) 20 MG tablet Take 1 tablet (20 mg total) by mouth 2 (two) times daily. 02/16/21 03/18/21 Yes Antonieta Pert, MD  thiamine 100 MG tablet Take 1 tablet (100 mg total) by mouth daily. 02/17/21 03/19/21 Yes Antonieta Pert, MD    Current Facility-Administered Medications  Medication Dose Route Frequency Provider Last Rate Last Admin   albumin human 25 % solution 12.5 g  12.5 g Intravenous Once Tamala Julian, Rondell A, MD       albuterol (PROVENTIL) (2.5 MG/3ML) 0.083% nebulizer  solution 2.5 mg  2.5 mg Nebulization Q6H PRN Tamala Julian, Rondell A, MD       lactulose (CHRONULAC) 10 GM/15ML solution 30 g  30 g Oral TID Fuller Plan A, MD       ondansetron (ZOFRAN) tablet 4 mg  4 mg Oral Q6H PRN Fuller Plan A, MD       Or   ondansetron (ZOFRAN) injection 4 mg  4 mg Intravenous Q6H PRN Smith, Rondell A, MD       pantoprazole (PROTONIX) EC tablet 40 mg  40 mg Oral BID Smith, Rondell A, MD   40 mg at 03/15/21 1526   sodium chloride flush (NS) 0.9 % injection 3 mL  3 mL Intravenous Q12H Smith, Rondell A, MD   3 mL at 03/15/21 1528   Current Outpatient Medications  Medication Sig Dispense Refill   folic acid (FOLVITE) 1 MG tablet Take 1 tablet (1 mg  total) by mouth daily. 30 tablet 0   meloxicam (MOBIC) 7.5 MG tablet Take 7.5 mg by mouth daily.     Multiple Vitamin (MULTIVITAMIN) tablet Take 1 tablet by mouth daily.     pantoprazole (PROTONIX) 40 MG tablet Take 1 tablet (40 mg total) by mouth 2 (two) times daily. 60 tablet 1   propranolol (INDERAL) 20 MG tablet Take 1 tablet (20 mg total) by mouth 2 (two) times daily. 60 tablet 0   thiamine 100 MG tablet Take 1 tablet (100 mg total) by mouth daily. 30 tablet 0    Allergies as of 03/15/2021 - Review Complete 03/15/2021  Allergen Reaction Noted   Amoxicillin Other (See Comments) 11/21/2020   Cephalexin Nausea Only 01/13/2007   Penicillins Rash 01/13/2007    Family History  Problem Relation Age of Onset   Hepatitis Mother    Cirrhosis Mother    Stroke Father    Heart failure Father    Kidney failure Brother     Social History   Socioeconomic History   Marital status: Divorced    Spouse name: Not on file   Number of children: Not on file   Years of education: Not on file   Highest education level: Not on file  Occupational History   Not on file  Tobacco Use   Smoking status: Never   Smokeless tobacco: Never  Vaping Use   Vaping Use: Never used  Substance and Sexual Activity   Alcohol use: Yes     Alcohol/week: 24.0 standard drinks    Types: 24 Cans of beer per week    Comment: 3-4 when home from work   Drug use: Not Currently    Types: Cocaine, Marijuana    Comment: last used 1 month ago   Sexual activity: Not Currently  Other Topics Concern   Not on file  Social History Narrative   Not on file   Social Determinants of Health   Financial Resource Strain: Not on file  Food Insecurity: Not on file  Transportation Needs: Not on file  Physical Activity: Not on file  Stress: Not on file  Social Connections: Not on file  Intimate Partner Violence: Not on file    Review of Systems: Unable to obtain secondary to altered mental status  OBJECTIVE    Physical Exam: Vital signs in last 24 hours: Temp:  [98.2 F (36.8 C)-98.6 F (37 C)] 98.6 F (37 C) (11/26 1240) Pulse Rate:  [58-65] 63 (11/26 1445) Resp:  [11-21] 15 (11/26 1445) BP: (110-125)/(67-109) 110/75 (11/26 1445) SpO2:  [93 %-100 %] 100 % (11/26 1445)   General:  Alert but confused male in NAD Psych:  Cooperative Eyes: Icteric sclera Ears:  Normal auditory acuity Nose: No deformity, discharge or lesions Neck:  Supple, no masses felt Lungs:  Clear to auscultation.  Heart:  Regular rate, regular rhythm. Pitting BLE edema Abdomen:  Soft, distended, , active bowel sounds, no masses felt Rectal :  Deferred Msk: Symmetrical without gross deformities.  Neurologic:  Alert, oriented, grossly normal neurologically Skin:  Intact without significant lesions.    Scheduled inpatient medications  lactulose  30 g Oral TID   pantoprazole  40 mg Oral BID   sodium chloride flush  3 mL Intravenous Q12H    Intake/Output from previous day: No intake/output data recorded. Intake/Output this shift: No intake/output data recorded.   Lab Results: Recent Labs    03/15/21 0958  WBC 10.0  HGB 7.9*  HCT 23.4*  PLT 129*  BMET Recent Labs    03/15/21 0958  NA 131*  K 3.8  CL 103  CO2 21*  GLUCOSE 103*  BUN  56*  CREATININE 4.69*  CALCIUM 8.4*   LFTs Recent Labs    03/15/21 0958  PROT 7.5  ALBUMIN 1.7*  AST 140*  ALT 51*  ALKPHOS 221*  BILITOT 11.9*   PT/INR Recent Labs    03/15/21 1239  LABPROT 18.4*  INR 1.5*   Hepatitis Panel No results for input(s): HEPBSAG, HCVAB, HEPAIGM, HEPBIGM in the last 72 hours.   . CBC Latest Ref Rng & Units 03/15/2021 02/16/2021 02/15/2021  WBC 4.0 - 10.5 K/uL 10.0 7.0 7.4  Hemoglobin 13.0 - 17.0 g/dL 7.9(L) 8.8(L) 8.7(L)  Hematocrit 39.0 - 52.0 % 23.4(L) 26.4(L) 25.1(L)  Platelets 150 - 400 K/uL 129(L) 226 201    . CMP Latest Ref Rng & Units 03/15/2021 02/16/2021 02/15/2021  Glucose 70 - 99 mg/dL 103(H) 100(H) 98  BUN 6 - 20 mg/dL 56(H) 16 15  Creatinine 0.61 - 1.24 mg/dL 4.69(H) 1.00 0.85  Sodium 135 - 145 mmol/L 131(L) 131(L) 132(L)  Potassium 3.5 - 5.1 mmol/L 3.8 3.7 3.4(L)  Chloride 98 - 111 mmol/L 103 104 106  CO2 22 - 32 mmol/L 21(L) 22 20(L)  Calcium 8.9 - 10.3 mg/dL 8.4(L) 8.6(L) 8.4(L)  Total Protein 6.5 - 8.1 g/dL 7.5 7.0 6.6  Total Bilirubin 0.3 - 1.2 mg/dL 11.9(H) 22.8(HH) 21.0(HH)  Alkaline Phos 38 - 126 U/L 221(H) 215(H) 211(H)  AST 15 - 41 U/L 140(H) 100(H) 102(H)  ALT 0 - 44 U/L 51(H) 39 41     Principal Problem:   Hepatic encephalopathy Active Problems:   Hyponatremia   Acute renal failure (ARF) (HCC)   Elevated LFTs   Alcoholic cirrhosis of liver with ascites (HCC)   Gastric ulcer due to nonsteroidal anti-inflammatory drug (NSAID)   Duodenal ulcer   Severe protein-calorie malnutrition (HCC)   Thrombocytopenia ([place])    Tye Savoy, NP-C @  03/15/2021, 3:44 PM

## 2021-03-15 NOTE — ED Provider Notes (Signed)
Emergency Medicine Provider Triage Evaluation Note  NOSSON WENDER , a 60 y.o. male  was evaluated in triage.  Pt complains of confusion.  Review of Systems  Positive: Confusion, fluid retention, jaundice Negative: Fever, cough, abd pain  Physical Exam  BP (!) 122/109 (BP Location: Left Arm)   Pulse 65   Temp 98.2 F (36.8 C)   Resp 18   SpO2 93%  Gen:   Awake, no distress, jaundice Resp:  Normal effort  MSK:   Moves extremities without difficulty  Other:    Medical Decision Making  Medically screening exam initiated at 9:54 AM.  Appropriate orders placed.  [name] was informed that the remainder of the evaluation will be completed by another provider, this initial triage assessment does not replace that evaluation, and the importance of remaining in the ED until their evaluation is complete.  Pt recently diagnosed with alcoholic liver cirrhosis brought here due to increased confusion x 4 days, abdominal distension and jaundice.  Pt without c/o abd pain, but does have painless jaundice.  Also hx of alcohol abuse.     Fayrene Helper, PA-C 03/15/21 0959    Melene Plan, DO 03/15/21 1243

## 2021-03-15 NOTE — TOC Initial Note (Signed)
Transition of Care [facility]) - Initial/Assessment Note    Patient Details  Name: Kyle Pitts MRN: [id number] Date of Birth: [birth year]  Transition [facility]) CM/SW Contact:    Lockie Pares, RN Phone Number: 03/15/2021, 1:17 PM  Clinical Narrative:                 Patient presented to the ED with [facility]. History of ETOH abuse. Ammonia level 163, LFT's elevated. . CT abdomen showed liver more enlarged and acites.  Patient is uninsured. Will need FU, does have PCP listed.  May need MATCH. For medications CM will follow for needs, recommendations, and transitions    Barriers to Discharge: Continued Medical Work up, Inadequate or no insurance   Patient Goals and CMS Choice        Expected Discharge Plan and Services     Discharge Planning Services: CM Consult   Living arrangements for the past 2 months: Single Family Home                                      Prior Living Arrangements/Services Living arrangements for the past 2 months: Single Family Home   Patient language and need for interpreter reviewed:: Yes        Need for Family Participation in Patient Care: Yes (Comment) Care giver support system in place?: Yes (comment)   Criminal Activity/Legal Involvement Pertinent to Current Situation/Hospitalization: No - Comment as needed  Activities of Daily Living      Permission Sought/Granted                  Emotional Assessment       Orientation: : Fluctuating Orientation (Suspected and/or reported Sundowners) Alcohol / Substance Use: Alcohol Use Psych Involvement: No (comment)  Admission diagnosis:  AMS Patient Active Problem List   Diagnosis Date Noted   Alcoholic cirrhosis of liver with ascites (HCC)    Esophageal varices in alcoholic cirrhosis (HCC)    Gastric ulcer due to nonsteroidal anti-inflammatory drug (NSAID)    Duodenal ulcer    Portal hypertensive gastropathy (HCC)    Acute blood loss anemia    ARF (acute renal  failure) (HCC) 02/13/2021   Elevated LFTs 02/13/2021   Allergic reaction 11/21/2020   Osteomyelitis of right leg (HCC) 11/21/2020   Streptococcus infection, group A 11/21/2020   Myofasciitis 11/21/2020   PICC line infection 11/08/2020   Anemia 11/08/2020   Hyponatremia 10/07/2020   Leucocytosis 10/07/2020   Cellulitis and abscess of right leg 10/07/2020   Septic arthritis of knee (HCC) 09/28/2020   ALLERGIC RHINITIS 01/13/2007   ASTHMA 01/13/2007   PCP:  [name], MD Pharmacy:   [facility] [phone] [place], [place] - [address] AT [facility] [address] [place] [address] Phone: [phone] Fax: [phone]     Social Determinants of Health (SDOH) Interventions    Readmission Risk Interventions No flowsheet data found.

## 2021-03-16 ENCOUNTER — Inpatient Hospital Stay (HOSPITAL_COMMUNITY): Payer: Medicaid Other

## 2021-03-16 DIAGNOSIS — E872 Acidosis, unspecified: Secondary | ICD-10-CM

## 2021-03-16 DIAGNOSIS — R609 Edema, unspecified: Secondary | ICD-10-CM

## 2021-03-16 DIAGNOSIS — K7682 Hepatic encephalopathy: Secondary | ICD-10-CM

## 2021-03-16 LAB — BODY FLUID CELL COUNT WITH DIFFERENTIAL
Eos, Fluid: 0 %
Lymphs, Fluid: 38 %
Monocyte-Macrophage-Serous Fluid: 59 % (ref 50–90)
Neutrophil Count, Fluid: 3 % (ref 0–25)
Total Nucleated Cell Count, Fluid: 141 cu mm (ref 0–1000)

## 2021-03-16 LAB — COMPREHENSIVE METABOLIC PANEL
ALT: 42 U/L (ref 0–44)
AST: 134 U/L — ABNORMAL HIGH (ref 15–41)
Albumin: 2.4 g/dL — ABNORMAL LOW (ref 3.5–5.0)
Alkaline Phosphatase: 180 U/L — ABNORMAL HIGH (ref 38–126)
Anion gap: 10 (ref 5–15)
BUN: 60 mg/dL — ABNORMAL HIGH (ref 6–20)
CO2: 19 mmol/L — ABNORMAL LOW (ref 22–32)
Calcium: 8.6 mg/dL — ABNORMAL LOW (ref 8.9–10.3)
Chloride: 104 mmol/L (ref 98–111)
Creatinine, Ser: 4.53 mg/dL — ABNORMAL HIGH (ref 0.61–1.24)
GFR, Estimated: 14 mL/min — ABNORMAL LOW (ref >60)
Glucose, Bld: 106 mg/dL — ABNORMAL HIGH (ref 70–99)
Potassium: 3.6 mmol/L (ref 3.5–5.1)
Sodium: 133 mmol/L — ABNORMAL LOW (ref 135–145)
Total Bilirubin: 11.7 mg/dL — ABNORMAL HIGH (ref 0.3–1.2)
Total Protein: 7.5 g/dL (ref 6.5–8.1)

## 2021-03-16 LAB — CBC
HCT: 20.3 % — ABNORMAL LOW (ref 39.0–52.0)
HCT: 17.2 % — ABNORMAL LOW (ref 39.0–52.0)
Hemoglobin: 7.2 g/dL — ABNORMAL LOW (ref 13.0–17.0)
Hemoglobin: 5.8 g/dL — CL (ref 13.0–17.0)
MCH: 36.9 pg — ABNORMAL HIGH (ref 26.0–34.0)
MCH: 35.6 pg — ABNORMAL HIGH (ref 26.0–34.0)
MCHC: 35.5 g/dL (ref 30.0–36.0)
MCHC: 33.7 g/dL (ref 30.0–36.0)
MCV: 104.1 fL — ABNORMAL HIGH (ref 80.0–100.0)
MCV: 105.5 fL — ABNORMAL HIGH (ref 80.0–100.0)
Platelets: 124 10*3/uL — ABNORMAL LOW (ref 150–400)
Platelets: 111 10*3/uL — ABNORMAL LOW (ref 150–400)
RBC: 1.95 MIL/uL — ABNORMAL LOW (ref 4.22–5.81)
RBC: 1.63 MIL/uL — ABNORMAL LOW (ref 4.22–5.81)
RDW: 15.1 % (ref 11.5–15.5)
RDW: 14.8 % (ref 11.5–15.5)
WBC: 8.5 10*3/uL (ref 4.0–10.5)
WBC: 7.3 10*3/uL (ref 4.0–10.5)
nRBC: 0 % (ref 0.0–0.2)
nRBC: 0 % (ref 0.0–0.2)

## 2021-03-16 LAB — ALBUMIN, PLEURAL OR PERITONEAL FLUID: Albumin, Fluid: <1.5 g/dL

## 2021-03-16 LAB — PROTEIN, PLEURAL OR PERITONEAL FLUID: Total protein, fluid: <3 g/dL

## 2021-03-16 LAB — LACTATE DEHYDROGENASE, PLEURAL OR PERITONEAL FLUID: LD, Fluid: 36 U/L — ABNORMAL HIGH (ref 3–23)

## 2021-03-16 LAB — PREPARE RBC (CROSSMATCH)

## 2021-03-16 LAB — GLUCOSE, PLEURAL OR PERITONEAL FLUID: Glucose, Fluid: 112 mg/dL

## 2021-03-16 LAB — OSMOLALITY: Osmolality: 304 mOsm/kg — ABNORMAL HIGH (ref 275–295)

## 2021-03-16 LAB — AMMONIA: Ammonia: 56 umol/L — ABNORMAL HIGH (ref 9–35)

## 2021-03-16 LAB — MAGNESIUM: Magnesium: 2.2 mg/dL (ref 1.7–2.4)

## 2021-03-16 MED ORDER — ALBUMIN HUMAN 25 % IV SOLN
12.5000 g | Freq: Once | INTRAVENOUS | Status: AC
  Administered 2021-03-16: 15:00:00 12.5 g via INTRAVENOUS
  Filled 2021-03-16: qty 50

## 2021-03-16 MED ORDER — RIFAXIMIN 550 MG PO TABS
550.0000 mg | ORAL_TABLET | Freq: Two times a day (BID) | ORAL | Status: DC
  Administered 2021-03-16 – 2021-03-22 (×12): 550 mg via ORAL
  Filled 2021-03-16 (×13): qty 1

## 2021-03-16 MED ORDER — MIDODRINE HCL 5 MG PO TABS
5.0000 mg | ORAL_TABLET | Freq: Three times a day (TID) | ORAL | Status: DC
  Administered 2021-03-16 – 2021-03-22 (×18): 5 mg via ORAL
  Filled 2021-03-16 (×19): qty 1

## 2021-03-16 MED ORDER — SODIUM CHLORIDE 0.9% IV SOLUTION: Freq: Once | INTRAVENOUS | Status: AC

## 2021-03-16 MED ORDER — LIDOCAINE HCL (PF) 1 % IJ SOLN
INTRAMUSCULAR | Status: AC
  Filled 2021-03-16: qty 30

## 2021-03-16 NOTE — Progress Notes (Signed)
Progress Note Hospital Day: 2  Chief Complaint:  decompensated cirrhosis       ASSESSMENT AND PLAN   # 60 yo male with decompensated cirrhosis ( presumably Etoh) with HE, ascites. Also with AKI / recurrent GI bleed with melena. MELD 35  in setting of AKI).  Non-bleeding grade II esophageal varices, portal hypertensive gastropathy, gastric ulcer and duodenal ulcer on EGD last month. Still taking Meloxicam at home --Despite AST / ALT ratio patient says he hasn't had Etoh in a month or more --Hepatic encephalopathy : improving. He is alert and oriented today. Still with asterixis on exam. Only about two BMs today on Q 4 lactulose. Continue Lactulose at present dose. I spoke with RN, she can reduce frequency if having more than 3 BMs / day           --Start clear liquids --He had 4L LVP today ( Nephrology gave extra albumin). Diagnostic tap ( no LVP given renal function). Fluid studies pending. Continue Rocephin for SBP prophylaxis, on day #  --Continue Octreotide infusion --Continue IV PPI --Hgb slowly drifting with ongoing dark stools 7.9 >> 7.2. Transfuse if needed --Will need EGD this admission, possibly tomorrow though we may wait until improved renal function ?   --Nephrology will be seeing patient for AKI. Anuric overnight. Some urine output today. Receiving albumin, midodrine. Cr 4.69 >> 4.53.  He has anasarca,. Net fluid balance  is ~ + 1 liter.     # Hx of PUD, ongoing Meloxicam use --Continue BID IV PPI --Eventual follow up EGD --I told him that he should not take Meloxicam given recent history of PUD / cirrhosis and now AKI      # LLE swelling / discomfort. Doppler study negative for clot   SUBJECTIVE   No complaints except he wants some sprite or water. Dark brown and yellowish liquid stool x 2 on lactulose   OBJECTIVE      Scheduled inpatient medications:   lactulose  30 g Oral Q4H   lidocaine (PF)       midodrine  5 mg Oral TID WC   pantoprazole  40 mg  Oral BID   sodium chloride flush  3 mL Intravenous Q12H   Continuous inpatient infusions:   albumin human     albumin human 50 g (03/16/21 1109)   cefTRIAXone (ROCEPHIN)  IV Stopped (03/15/21 1816)   octreotide  (SANDOSTATIN)    IV infusion 50 mcg/hr (03/16/21 1353)   PRN inpatient medications: albuterol, ondansetron **OR** ondansetron (ZOFRAN) IV  Vital signs in last 24 hours: Temp:  [97.2 F (36.2 C)-98.2 F (36.8 C)] 97.4 F (36.3 C) (11/27 1121) Pulse Rate:  [51-76] 63 (11/27 1121) Resp:  [10-20] 18 (11/27 1121) BP: (91-131)/(56-95) 91/62 (11/27 1210) SpO2:  [93 %-100 %] 99 % (11/27 1121) Weight:  [111.9 kg] 111.9 kg (11/27 0509) Last BM Date: 03/16/21  Intake/Output Summary (Last 24 hours) at 03/16/2021 1451 Last data filed at 03/16/2021 1125 Gross per 24 hour  Intake 620.72 ml  Output 100 ml  Net 520.72 ml     Physical Exam:  General: Alert male in NAD Heart:  Regular rate and rhythm. Anasarca. Pitting edema BLE. Significant Left pedal edema.  Pulmonary: Normal respiratory effort Abdomen: Soft, distended, nontender. Normal bowel sounds.  Neurologic: Alert and oriented but still + asterixis Psych: Pleasant. Cooperative.   Filed Weights   03/16/21 0027 03/16/21 0509  Weight: 111.9 kg 111.9 kg    Intake/Output from previous  day: 11/26 0701 - 11/27 0700 In: 617.7 [I.V.:320.5; IV Piggyback:297.3] Out: 100 [Urine:100] Intake/Output this shift: Total I/O In: 3 [I.V.:3] Out: -     Lab Results: Recent Labs    03/15/21 0958 03/15/21 1830 03/16/21 0323  WBC 10.0 8.4 8.5  HGB 7.9* 7.4* 7.2*  HCT 23.4* 21.4* 20.3*  PLT 129* 123* 124*   BMET Recent Labs    03/15/21 0958 03/16/21 0323  NA 131* 133*  K 3.8 3.6  CL 103 104  CO2 21* 19*  GLUCOSE 103* 106*  BUN 56* 60*  CREATININE 4.69* 4.53*  CALCIUM 8.4* 8.6*   LFT Recent Labs    03/16/21 0323  PROT 7.5  ALBUMIN 2.4*  AST 134*  ALT 42  ALKPHOS 180*  BILITOT 11.7*   PT/INR Recent Labs     03/15/21 1239  LABPROT 18.4*  INR 1.5*   Hepatitis Panel No results for input(s): HEPBSAG, HCVAB, HEPAIGM, HEPBIGM in the last 72 hours.  CT ABDOMEN PELVIS WO CONTRAST  Result Date: 03/15/2021 CLINICAL DATA:  Edema in the abdomen and extremities for 1 month. History of cirrhosis. Jaundice and abdominal pain. EXAM: CT ABDOMEN AND PELVIS WITHOUT CONTRAST TECHNIQUE: Multidetector CT imaging of the abdomen and pelvis was performed following the standard protocol without IV contrast. COMPARISON:  Abdominopelvic CT 02/13/2021. FINDINGS: Lower chest: Images through the lung bases are mildly degraded by breathing artifact. There is mildly increased atelectasis in the right middle lobe. No significant pleural or pericardial effusion. Coronary artery atherosclerosis noted. Hepatobiliary: Again demonstrated are changes of advanced cirrhosis with diffuse contour irregularity of the liver and relative enlargement of the caudate and left lobes. No focal abnormalities are identified on noncontrast imaging. Compared with the recent study, the liver has decreased in size, and previously demonstrated diffuse steatosis as improved. Probable mild dependent sludge in the gallbladder lumen. No discrete gallstones, gallbladder wall thickening or biliary dilatation. Pancreas: Unremarkable. No pancreatic ductal dilatation or surrounding inflammatory changes. Spleen: Normal in size without focal abnormality. Adrenals/Urinary Tract: Both adrenal glands appear normal. Both kidneys appear unremarkable. No evidence of urinary tract calculus or hydronephrosis. The bladder appears normal. Stomach/Bowel: No enteric contrast administered. The stomach appears unremarkable for its degree of distention. Mild nonspecific right colonic wall thickening, likely related to cirrhosis and ascites. No evidence of bowel distension or surrounding focal inflammation. Vascular/Lymphatic: There are no enlarged abdominal or pelvic lymph nodes. Mild  aortic and branch vessel atherosclerosis. Reproductive: The prostate gland and seminal vesicles appear unremarkable. Other: Interval significant increase in volume of ascites, now moderate to large. In addition, there is generalized soft tissue edema throughout the subcutaneous and intra-fat. No focal extraluminal fluid or air collections are seen. Musculoskeletal: No acute or significant osseous findings. Severe multilevel thoracolumbar spondylosis with multilevel disc and endplate degeneration, grossly unchanged. There is the potential for nerve root encroachment, especially within the L5-S1 foramina bilaterally. IMPRESSION: 1. Significant progression of ascites with generalized soft tissue edema consistent with anasarca, likely related to cirrhosis and hepatic dysfunction. 2. Over the last month, the liver has decreased in size with improved steatosis. 3. Mild nonspecific right colonic wall thickening, likely related to liver disease/cirrhosis. 4.  Aortic Atherosclerosis (ICD10-I70.0). Electronically Signed   By: Richardean Sale M.D.   On: 03/15/2021 12:31   DG Chest 2 View  Result Date: 03/15/2021 CLINICAL DATA:  Altered mental status. EXAM: CHEST - 2 VIEW COMPARISON:  10/07/2020 chest radiograph FINDINGS: Cardiomediastinal silhouette is unremarkable. This is a low volume study with mild  bibasilar atelectasis. There is no evidence of focal airspace disease, pulmonary edema, suspicious pulmonary nodule/mass, pleural effusion, or pneumothorax. No acute bony abnormalities are identified. IMPRESSION: Low volume study with mild bibasilar atelectasis. Electronically Signed   By: Margarette Canada M.D.   On: 03/15/2021 12:13   US Paracentesis  Result Date: 03/16/2021 INDICATION: Abdominal distention. Ascites due to alcoholic cirrhosis. Request for diagnostic and therapeutic paracentesis up to 4 L max. EXAM: ULTRASOUND GUIDED RIGHT LOWER QUADRANT PARACENTESIS MEDICATIONS: 1% plain lidocaine, 5 mL COMPLICATIONS: None  immediate. PROCEDURE: Informed written consent was obtained from the patient after a discussion of the risks, benefits and alternatives to treatment. A timeout was performed prior to the initiation of the procedure. Initial ultrasound scanning demonstrates a large amount of ascites within the right lower abdominal quadrant. The right lower abdomen was prepped and draped in the usual sterile fashion. 1% lidocaine was used for local anesthesia. Following this, a 19 gauge, 7-cm, Yueh catheter was introduced. An ultrasound image was saved for documentation purposes. The paracentesis was performed. The catheter was removed and a dressing was applied. The patient tolerated the procedure well without immediate post procedural complication. FINDINGS: A total of approximately 4 L of clear yellow fluid was removed. Samples were sent to the laboratory as requested by the clinical team. IMPRESSION: Successful ultrasound-guided paracentesis yielding 4 liters of peritoneal fluid. Read by: Ascencion Dike PA-C Electronically Signed   By: Sandi Mariscal M.D.   On: 03/16/2021 13:21   VAS [place] LOWER EXTREMITY VENOUS (DVT) (7a-7p)  Result Date: 03/16/2021  Lower Venous DVT Study Patient Name:  Kyle Pitts  Date of Exam:   03/16/2021 Medical Rec #: [id number]        Accession #:    [id number] Date of Birth: 01/16/1961        Patient Gender: M Patient Age:   82 years Exam Location:  [facility] Procedure:      VAS [place] LOWER EXTREMITY VENOUS (DVT) Referring Phys: SOPHIA CACCAVALE --------------------------------------------------------------------------------  Indications: Edema (bilateral pitting). Other Indications: Cirrhosis, anasarca. Comparison Study: Previous exam 10/07/2020 was negative for DVT in LLE Performing Technologist: Rogelia Rohrer RVT, RDMS  Examination Guidelines: A complete evaluation includes B-mode imaging, spectral Doppler, color Doppler, and power Doppler as needed of all accessible portions of each vessel.  Bilateral testing is considered an integral part of a complete examination. Limited examinations for reoccurring indications may be performed as noted. The reflux portion of the exam is performed with the patient in reverse Trendelenburg.  +---------+---------------+---------+-----------+----------+--------------+ RIGHT    CompressibilityPhasicitySpontaneityPropertiesThrombus Aging +---------+---------------+---------+-----------+----------+--------------+ CFV      Full           Yes      Yes                                 +---------+---------------+---------+-----------+----------+--------------+ SFJ      Full                                                        +---------+---------------+---------+-----------+----------+--------------+ FV Prox  Full           Yes      Yes                                 +---------+---------------+---------+-----------+----------+--------------+  FV Mid   Full           Yes      Yes                                 +---------+---------------+---------+-----------+----------+--------------+ FV DistalFull           Yes      Yes                                 +---------+---------------+---------+-----------+----------+--------------+ PFV      Full                                                        +---------+---------------+---------+-----------+----------+--------------+ POP      Full           Yes      Yes                                 +---------+---------------+---------+-----------+----------+--------------+ PTV      Full                                                        +---------+---------------+---------+-----------+----------+--------------+ PERO     Full                                                        +---------+---------------+---------+-----------+----------+--------------+   +---------+---------------+---------+-----------+----------+--------------+ LEFT      CompressibilityPhasicitySpontaneityPropertiesThrombus Aging +---------+---------------+---------+-----------+----------+--------------+ CFV      Full           Yes      Yes                                 +---------+---------------+---------+-----------+----------+--------------+ SFJ      Full                                                        +---------+---------------+---------+-----------+----------+--------------+ FV Prox  Full           Yes      Yes                                 +---------+---------------+---------+-----------+----------+--------------+ FV Mid   Full           Yes      Yes                                 +---------+---------------+---------+-----------+----------+--------------+ FV DistalFull  Yes      Yes                                 +---------+---------------+---------+-----------+----------+--------------+ PFV      Full                                                        +---------+---------------+---------+-----------+----------+--------------+ POP      Full           Yes      Yes                                 +---------+---------------+---------+-----------+----------+--------------+ PTV      Full                                                        +---------+---------------+---------+-----------+----------+--------------+ PERO     Full                                                        +---------+---------------+---------+-----------+----------+--------------+     Summary: BILATERAL: - No evidence of deep vein thrombosis seen in the lower extremities, bilaterally. - No evidence of superficial venous thrombosis in the lower extremities, bilaterally. -No evidence of popliteal cyst, bilaterally. RIGHT: - Ultrasound characteristics of enlarged lymph nodes are noted in the groin. Diffuse subcutaneous edema noted throughout.  LEFT: - Ultrasound characteristics of enlarged lymph nodes noted in the  groin. Diffuse subcutaneous edema noted throughout.  *See table(s) above for measurements and observations.    Preliminary       Principal Problem:   Hepatic encephalopathy Active Problems:   Hyponatremia   Acute renal failure (ARF) (HCC)   Elevated LFTs   Alcoholic cirrhosis of liver with ascites (HCC)   Gastric ulcer due to nonsteroidal anti-inflammatory drug (NSAID)   Duodenal ulcer   Severe protein-calorie malnutrition (HCC)   Thrombocytopenia (HCC)   Metabolic acidosis     LOS: 1 day   Tye Savoy ,NP 03/16/2021, 2:51 PM

## 2021-03-16 NOTE — Plan of Care (Signed)
  Problem: Health Behavior/Discharge Planning: Goal: Ability to manage health-related needs will improve Outcome: Progressing   Problem: Clinical Measurements: Goal: Ability to maintain clinical measurements within normal limits will improve Outcome: Progressing   

## 2021-03-16 NOTE — Progress Notes (Signed)
Patient ID: Kyle Pitts, male   DOB: 11/01/1960, 60 y.o.   MRN: [id number] [facility] Progress Note   Assessment/ Plan:   1. Acute kidney injury: Anuric urine output charted overnight, will order for bladder scan to see if he needs placement of a Foley catheter although his mental status has improved significantly since I saw him yesterday and I instructed for him to urinate in urinal.  Collection.  Renal function essentially unchanged overnight and the plan is to monitor him with continued albumin infusions and ongoing octreotide.  We will begin low-dose midodrine and await paracentesis today which I suspect will help improve intra-abdominal pressures/renal perfusion as well.  No acute indication for dialysis. 2. Hyponatremia: Secondary to cirrhosis and inappropriate ADH activation from decreased effective arterial blood volume.  Continue to limit oral fluid intake and monitor with efforts at volume expansion using albumin. 3.  Encephalopathy: This appears to have been predominantly hepatic encephalopathy with significant improvement overnight following administration of lactulose. 4.  Anemia: Likely secondary to cirrhosis with associated GI bleed from recent gastritis and duodenitis in the setting of ongoing meloxicam use.  Restart PPI. 5.  Thrombocytopenia: Secondary to portal hypertension and splenic sequestration, no evidence of bleeding diathesis. Subjective:   Reports to be feeling better and asks about drinking some water.   Objective:   BP 102/61 (BP Location: Right Arm)   Pulse 71   Temp 97.7 F (36.5 C) (Oral)   Resp 17   Ht 6' (1.829 m)   Wt 111.9 kg   SpO2 97%   BMI 33.47 kg/m   Intake/Output Summary (Last 24 hours) at 03/16/2021 0741 Last data filed at 03/16/2021 7026 Gross per 24 hour  Intake 617.72 ml  Output 100 ml  Net 517.72 ml   Weight change:   Physical Exam: Gen: Comfortably resting in bed CVS: Pulse regular rhythm, normal rate, ejection  systolic murmur audible Resp: Clear to auscultation bilaterally, no rales/rhonchi Abd: Soft, moderate global distention, firm, bowel sounds normal Ext: 2+ bilateral pitting lower extremity edema  Imaging: CT ABDOMEN PELVIS WO CONTRAST  Result Date: 03/15/2021 CLINICAL DATA:  Edema in the abdomen and extremities for 1 month. History of cirrhosis. Jaundice and abdominal pain. EXAM: CT ABDOMEN AND PELVIS WITHOUT CONTRAST TECHNIQUE: Multidetector CT imaging of the abdomen and pelvis was performed following the standard protocol without IV contrast. COMPARISON:  Abdominopelvic CT 02/13/2021. FINDINGS: Lower chest: Images through the lung bases are mildly degraded by breathing artifact. There is mildly increased atelectasis in the right middle lobe. No significant pleural or pericardial effusion. Coronary artery atherosclerosis noted. Hepatobiliary: Again demonstrated are changes of advanced cirrhosis with diffuse contour irregularity of the liver and relative enlargement of the caudate and left lobes. No focal abnormalities are identified on noncontrast imaging. Compared with the recent study, the liver has decreased in size, and previously demonstrated diffuse steatosis as improved. Probable mild dependent sludge in the gallbladder lumen. No discrete gallstones, gallbladder wall thickening or biliary dilatation. Pancreas: Unremarkable. No pancreatic ductal dilatation or surrounding inflammatory changes. Spleen: Normal in size without focal abnormality. Adrenals/Urinary Tract: Both adrenal glands appear normal. Both kidneys appear unremarkable. No evidence of urinary tract calculus or hydronephrosis. The bladder appears normal. Stomach/Bowel: No enteric contrast administered. The stomach appears unremarkable for its degree of distention. Mild nonspecific right colonic wall thickening, likely related to cirrhosis and ascites. No evidence of bowel distension or surrounding focal inflammation. Vascular/Lymphatic:  There are no enlarged abdominal or pelvic lymph nodes. Mild  aortic and branch vessel atherosclerosis. Reproductive: The prostate gland and seminal vesicles appear unremarkable. Other: Interval significant increase in volume of ascites, now moderate to large. In addition, there is generalized soft tissue edema throughout the subcutaneous and intra-fat. No focal extraluminal fluid or air collections are seen. Musculoskeletal: No acute or significant osseous findings. Severe multilevel thoracolumbar spondylosis with multilevel disc and endplate degeneration, grossly unchanged. There is the potential for nerve root encroachment, especially within the L5-S1 foramina bilaterally. IMPRESSION: 1. Significant progression of ascites with generalized soft tissue edema consistent with anasarca, likely related to cirrhosis and hepatic dysfunction. 2. Over the last month, the liver has decreased in size with improved steatosis. 3. Mild nonspecific right colonic wall thickening, likely related to liver disease/cirrhosis. 4.  Aortic Atherosclerosis (ICD10-I70.0). Electronically Signed   By: Carey Bullocks M.D.   On: 03/15/2021 12:31   DG Chest 2 View  Result Date: 03/15/2021 CLINICAL DATA:  Altered mental status. EXAM: CHEST - 2 VIEW COMPARISON:  10/07/2020 chest radiograph FINDINGS: Cardiomediastinal silhouette is unremarkable. This is a low volume study with mild bibasilar atelectasis. There is no evidence of focal airspace disease, pulmonary edema, suspicious pulmonary nodule/mass, pleural effusion, or pneumothorax. No acute bony abnormalities are identified. IMPRESSION: Low volume study with mild bibasilar atelectasis. Electronically Signed   By: Harmon Pier M.D.   On: 03/15/2021 12:13    Labs: BMET Recent Labs  Lab 03/15/21 0958 03/16/21 0323  NA 131* 133*  K 3.8 3.6  CL 103 104  CO2 21* 19*  GLUCOSE 103* 106*  BUN 56* 60*  CREATININE 4.69* 4.53*  CALCIUM 8.4* 8.6*   CBC Recent Labs  Lab 03/15/21 0958  03/15/21 1830 03/16/21 0323  WBC 10.0 8.4 8.5  NEUTROABS 7.1  --   --   HGB 7.9* 7.4* 7.2*  HCT 23.4* 21.4* 20.3*  MCV 106.4* 104.4* 104.1*  PLT 129* 123* 124*    Medications:     lactulose  30 g Oral Q4H   pantoprazole  40 mg Oral BID   sodium chloride flush  3 mL Intravenous Q12H   Zetta Bills, MD 03/16/2021, 7:41 AM

## 2021-03-16 NOTE — Progress Notes (Signed)
PROGRESS NOTE    DEVERY OBERHELMAN  [id number] DOB: October 18, 1960 DOA: 03/15/2021 PCP: Default, Provider, MD   Brief Narrative:  HPI: Kyle Pitts is a 60 y.o. male with medical history significant of alcoholic cirrhosis with esophageal varices and septic arthritis of right knee who presents after being noted to be more confused by sister this morning.  History is limited from patient as he is confused and his nephew who is present at bedside tries to provide additional history.  He had just recently been hospitalized 10/26-10/30 with likely alcoholic cirrhosis.  Patient had undergone EGD on 10/29 noting grade 2 esophageal varices along with gastric and duodenal ulcers.  Since getting home patient reports that since getting home he reports to continued use of Mobic although appears to have been recommended to discontinue NSAIDs during his last hospitalization.  Last drink was reported prior to previous hospitalization last month.  He has reported dark stools.  His nephew reports that the patient did not urinate yesterday and felt like he could not urinate today, but has been unable to do so.  His sister noted that he was talking abnormal this morning for which she called EMS.   ED Course: Upon admission into the emergency department patient was seen to be afebrile with vital signs otherwise stable.  Labs significant for hemoglobin 7.9, MCV 106.4, MCH 35.9, sodium 131, BUN 56, creatinine 4.69, glucose 103, alkaline phosphatase 221, albumin 1.7, AST 140, ALT 51, total bilirubin 11.9, ammonia 153, and INR 1.5.  Chest x-ray noted low lung volumes with mild bibasilar atelectasis.  CT scan of the abdomen and pelvis noted significant progression of ascites with generalized soft tissue edema consistent with anasarca with liver noted to be decreased in size with improve steatosis and mild nonspecific right colonic wall thickening.  COVID-19 and influenza screening were negative.  Patient had been given lactulose  30 g x 1 dose.  TRH called to admit.  Assessment & Plan:   Principal Problem:   Hepatic encephalopathy Active Problems:   Hyponatremia   Acute renal failure (ARF) (HCC)   Elevated LFTs   Alcoholic cirrhosis of liver with ascites (HCC)   Gastric ulcer due to nonsteroidal anti-inflammatory drug (NSAID)   Duodenal ulcer   Severe protein-calorie malnutrition (HCC)   Thrombocytopenia (HCC)   Metabolic acidosis   Hepatic encephalopathy secondary to decompensated alcoholic cirrhosis with ascites:  ammonia elevated at 156.  Just recently hospitalized for suspected alcoholic cirrhosis.  Had extensive work-up GI for which alcoholic hepatitis was thought to be less likely by GI.  Noted to have autoimmune markers-IgG elevated to 2342, normal ceruloplasmin hep B surface antigen alpha antitrypsin, pending antimitochondrial antibody ANA anti-smooth muscle antibody.  Underwent EGD evaluation 10/29 noted to have gastric and duodenal ulcers.  During his last hospitalization he was not noted to be a candidate for transplant given recent alcohol use. Patient's MELD score is 35 with a 52.6% estimated 80-month mortality.  Seen by GI.  Started on 4 times daily lactulose.  Mental status improved, currently alert and oriented.  Repeating ammonia level.  Also some suspicion of SBP for which she was started on Rocephin.  Diagnostic paracentesis ordered but still pending.   Acute renal failure with metabolic acidosis: Patient reports no urine output in the last 1 to 2 days prior to admission.  Creatinine of 4.69 with BUN 56.  Baseline creatinine previously had been around 1 and discharged on 10/30.  Likely hepatorenal syndrome.  Not much urine output and  no improvement in creatinine.  Nephrology on board and managing and we appreciate their help.   Transaminitis and hyperbilirubinemia: Chronic. Alkaline phosphatase 221,  AST 140, ALT 51, and total bilirubin 11.9.  Total bilirubin appears to be improved from previous  22.8. -Continue to monitor   Macrocytic anemia secondary to suspect GI bleed,Acute on chronic/gastric and duodenal ulcers: Patient reports having dark stools at home prior to coming in and admitted to still using Mobic.  Last EGD from 10/29 noted gastric and duodenal ulcers.  Hemoglobin on admission noted to be 7.9 with elevated MCV and MCH.  Previously had been 8.8 on discharge on 10/30.  Now hemoglobin dropped to 7.2.  Patient remains on octreotide and PPI drips.  GI on board and plan for EGD at some point in time.  Will need transfusion if hemoglobin drops less than 7.  We will recheck CBC later today and tomorrow morning.   Acute thrombocytopenia: Around 129 and stable since yesterday.  Likely secondary to chronic alcoholism.   Hyponatremia: Acute on chronic.  Suspected secondary to cirrhosis.  Currently 133.  Nephrology managing.   Severe protein calorie malnutrition: On admission albumin noted to be low at 1.7.  Will consult nutrition.   DVT prophylaxis: SCDs Start: 03/15/21 1420   Code Status: Full Code  Family Communication:  None present at bedside.  Plan of care discussed with patient in length and he verbalized understanding and agreed with it.  Status is: Inpatient  Remains inpatient appropriate because: Needs further management of possible GI bleed and acute renal failure.   Estimated body mass index is 33.47 kg/m as calculated from the following:   Height as of this encounter: 6' (1.829 m).   Weight as of this encounter: 111.9 kg.     Nutritional Assessment: Body mass index is 33.47 kg/m.Marland Kitchen Seen by dietician.  I agree with the assessment and plan as outlined below: Nutrition Status:        .  Skin Assessment: I have examined the patient's skin and I agree with the wound assessment as performed by the wound care RN as outlined below:    Consultants:  Nephrology GI  Procedures:  None  Antimicrobials:  Anti-infectives (From admission, onward)    Start      Dose/Rate Route Frequency Ordered Stop   03/15/21 1730  cefTRIAXone (ROCEPHIN) 1 g in sodium chloride 0.9 % 100 mL IVPB        1 g 200 mL/hr over 30 Minutes Intravenous Every 24 hours 03/15/21 1709     03/15/21 1645  ciprofloxacin (CIPRO) IVPB 200 mg  Status:  Discontinued        200 mg 100 mL/hr over 60 Minutes Intravenous Every 24 hours 03/15/21 1644 03/15/21 1709          Subjective: Patient seen and examined.  He is fully alert and oriented.  He has no complaints.  Objective: Vitals:   03/16/21 0027 03/16/21 0504 03/16/21 0509 03/16/21 0732  BP: 117/66 102/61    Pulse: 67 71    Resp: 16 17    Temp: (!) 97.2 F (36.2 C) 97.6 F (36.4 C)  97.7 F (36.5 C)  TempSrc: Oral Oral  Oral  SpO2: 93% 97%    Weight: 111.9 kg  111.9 kg   Height: 6' (1.829 m)       Intake/Output Summary (Last 24 hours) at 03/16/2021 1016 Last data filed at 03/16/2021 0857 Gross per 24 hour  Intake 617.72 ml  Output 100 ml  Net 517.72 ml   Filed Weights   03/16/21 0027 03/16/21 0509  Weight: 111.9 kg 111.9 kg    Examination:  General exam: Appears calm and comfortable, visibly icteric Respiratory system: Clear to auscultation. Respiratory effort normal. Cardiovascular system: S1 & S2 heard, RRR. No JVD, murmurs, rubs, gallops or clicks.  +3 pitting edema bilateral lower extremity Gastrointestinal system: Abdomen is slightly distended, soft, nontender with positive fluid shift. No organomegaly or masses felt. Normal bowel sounds heard. Central nervous system: Alert and oriented. No focal neurological deficits. Extremities: Symmetric 5 x 5 power. Skin: No rashes, lesions or ulcers Psychiatry: Judgement and insight appear poor   Data Reviewed: I have personally reviewed following labs and imaging studies  CBC: Recent Labs  Lab 03/15/21 0958 03/15/21 1830 03/16/21 0323  WBC 10.0 8.4 8.5  NEUTROABS 7.1  --   --   HGB 7.9* 7.4* 7.2*  HCT 23.4* 21.4* 20.3*  MCV 106.4* 104.4* 104.1*   PLT 129* 123* [id number]*   Basic Metabolic Panel: Recent Labs  Lab 03/15/21 0958 03/16/21 0323  NA 131* 133*  K 3.8 3.6  CL 103 104  CO2 21* 19*  GLUCOSE 103* 106*  BUN 56* 60*  CREATININE 4.69* 4.53*  CALCIUM 8.4* 8.6*   GFR: Estimated Creatinine Clearance: 23 mL/min (A) (by C-G formula based on SCr of 4.53 mg/dL (H)). Liver Function Tests: Recent Labs  Lab 03/15/21 0958 03/16/21 0323  AST 140* 134*  ALT 51* 42  ALKPHOS 221* 180*  BILITOT 11.9* 11.7*  PROT 7.5 7.5  ALBUMIN 1.7* 2.4*   No results for input(s): LIPASE, AMYLASE in the last 168 hours. Recent Labs  Lab 03/15/21 0958  AMMONIA 153*   Coagulation Profile: Recent Labs  Lab 03/15/21 1239  INR 1.5*   Cardiac Enzymes: No results for input(s): CKTOTAL, CKMB, CKMBINDEX, TROPONINI in the last 168 hours. BNP (last 3 results) No results for input(s): PROBNP in the last 8760 hours. HbA1C: No results for input(s): HGBA1C in the last 72 hours. CBG: Recent Labs  Lab 03/15/21 1013  GLUCAP 82   Lipid Profile: No results for input(s): CHOL, HDL, LDLCALC, TRIG, CHOLHDL, LDLDIRECT in the last 72 hours. Thyroid Function Tests: No results for input(s): TSH, T4TOTAL, FREET4, T3FREE, THYROIDAB in the last 72 hours. Anemia Panel: No results for input(s): VITAMINB12, FOLATE, FERRITIN, TIBC, IRON, RETICCTPCT in the last 72 hours. Sepsis Labs: No results for input(s): PROCALCITON, LATICACIDVEN in the last 168 hours.  Recent Results (from the past 240 hour(s))  Resp Panel by RT-PCR (Flu A&B, Covid) Nasopharyngeal Swab     Status: None   Collection Time: 03/15/21 10:14 AM   Specimen: Nasopharyngeal Swab; Nasopharyngeal(NP) swabs in vial transport medium  Result Value Ref Range Status   SARS Coronavirus 2 by RT PCR NEGATIVE NEGATIVE Final    Comment: (NOTE) SARS-CoV-2 target nucleic acids are NOT DETECTED.  The SARS-CoV-2 RNA is generally detectable in upper respiratory specimens during the acute phase of infection.  The lowest concentration of SARS-CoV-2 viral copies this assay can detect is 138 copies/mL. A negative result does not preclude SARS-Cov-2 infection and should not be used as the sole basis for treatment or other patient management decisions. A negative result may occur with  improper specimen collection/handling, submission of specimen other than nasopharyngeal swab, presence of viral mutation(s) within the areas targeted by this assay, and inadequate number of viral copies(<138 copies/mL). A negative result must be combined with clinical observations, patient history, and epidemiological information. The expected  result is Negative.  Fact Sheet for Patients:  EntrepreneurPulse.com.au  Fact Sheet for Healthcare Providers:  IncredibleEmployment.be  This test is no t yet approved or cleared by the [place] FDA and  has been authorized for detection and/or diagnosis of SARS-CoV-2 by FDA under an Emergency Use Authorization (EUA). This EUA will remain  in effect (meaning this test can be used) for the duration of the COVID-19 declaration under Section 564(b)(1) of the Act, 21 U.S.C.section 360bbb-3(b)(1), unless the authorization is terminated  or revoked sooner.       Influenza A by PCR NEGATIVE NEGATIVE Final   Influenza B by PCR NEGATIVE NEGATIVE Final    Comment: (NOTE) The Xpert Xpress SARS-CoV-2/FLU/RSV plus assay is intended as an aid in the diagnosis of influenza from Nasopharyngeal swab specimens and should not be used as a sole basis for treatment. Nasal washings and aspirates are unacceptable for Xpert Xpress SARS-CoV-2/FLU/RSV testing.  Fact Sheet for Patients: EntrepreneurPulse.com.au  Fact Sheet for Healthcare Providers: IncredibleEmployment.be  This test is not yet approved or cleared by the [place] FDA and has been authorized for detection and/or diagnosis of SARS-CoV-2 by FDA  under an Emergency Use Authorization (EUA). This EUA will remain in effect (meaning this test can be used) for the duration of the COVID-19 declaration under Section 564(b)(1) of the Act, 21 U.S.C. section 360bbb-3(b)(1), unless the authorization is terminated or revoked.  Performed at [facility], [place] [address], [place], [place] [address]       Radiology Studies: CT ABDOMEN PELVIS WO CONTRAST  Result Date: 03/15/2021 CLINICAL DATA:  Edema in the abdomen and extremities for 1 month. History of cirrhosis. Jaundice and abdominal pain. EXAM: CT ABDOMEN AND PELVIS WITHOUT CONTRAST TECHNIQUE: Multidetector CT imaging of the abdomen and pelvis was performed following the standard protocol without IV contrast. COMPARISON:  Abdominopelvic CT 02/13/2021. FINDINGS: Lower chest: Images through the lung bases are mildly degraded by breathing artifact. There is mildly increased atelectasis in the right middle lobe. No significant pleural or pericardial effusion. Coronary artery atherosclerosis noted. Hepatobiliary: Again demonstrated are changes of advanced cirrhosis with diffuse contour irregularity of the liver and relative enlargement of the caudate and left lobes. No focal abnormalities are identified on noncontrast imaging. Compared with the recent study, the liver has decreased in size, and previously demonstrated diffuse steatosis as improved. Probable mild dependent sludge in the gallbladder lumen. No discrete gallstones, gallbladder wall thickening or biliary dilatation. Pancreas: Unremarkable. No pancreatic ductal dilatation or surrounding inflammatory changes. Spleen: Normal in size without focal abnormality. Adrenals/Urinary Tract: Both adrenal glands appear normal. Both kidneys appear unremarkable. No evidence of urinary tract calculus or hydronephrosis. The bladder appears normal. Stomach/Bowel: No enteric contrast administered. The stomach appears unremarkable for its degree of distention.  Mild nonspecific right colonic wall thickening, likely related to cirrhosis and ascites. No evidence of bowel distension or surrounding focal inflammation. Vascular/Lymphatic: There are no enlarged abdominal or pelvic lymph nodes. Mild aortic and branch vessel atherosclerosis. Reproductive: The prostate gland and seminal vesicles appear unremarkable. Other: Interval significant increase in volume of ascites, now moderate to large. In addition, there is generalized soft tissue edema throughout the subcutaneous and intra-fat. No focal extraluminal fluid or air collections are seen. Musculoskeletal: No acute or significant osseous findings. Severe multilevel thoracolumbar spondylosis with multilevel disc and endplate degeneration, grossly unchanged. There is the potential for nerve root encroachment, especially within the L5-S1 foramina bilaterally. IMPRESSION: 1. Significant progression of ascites with generalized soft tissue  edema consistent with anasarca, likely related to cirrhosis and hepatic dysfunction. 2. Over the last month, the liver has decreased in size with improved steatosis. 3. Mild nonspecific right colonic wall thickening, likely related to liver disease/cirrhosis. 4.  Aortic Atherosclerosis (ICD10-I70.0). Electronically Signed   By: Richardean Sale M.D.   On: 03/15/2021 12:31   DG Chest 2 View  Result Date: 03/15/2021 CLINICAL DATA:  Altered mental status. EXAM: CHEST - 2 VIEW COMPARISON:  10/07/2020 chest radiograph FINDINGS: Cardiomediastinal silhouette is unremarkable. This is a low volume study with mild bibasilar atelectasis. There is no evidence of focal airspace disease, pulmonary edema, suspicious pulmonary nodule/mass, pleural effusion, or pneumothorax. No acute bony abnormalities are identified. IMPRESSION: Low volume study with mild bibasilar atelectasis. Electronically Signed   By: Margarette Canada M.D.   On: 03/15/2021 12:13    Scheduled Meds:  lactulose  30 g Oral Q4H   midodrine  5  mg Oral TID WC   pantoprazole  40 mg Oral BID   sodium chloride flush  3 mL Intravenous Q12H   Continuous Infusions:  albumin human 50 g (03/15/21 2239)   cefTRIAXone (ROCEPHIN)  IV Stopped (03/15/21 1816)   octreotide  (SANDOSTATIN)    IV infusion 50 mcg/hr (03/16/21 0230)     LOS: 1 day   Time spent: 37 minutes   Darliss Cheney, MD [facility]  03/16/2021, 10:16 AM  Please page via [facility] and do not message via secure chat for anything urgent. Secure chat can be used for anything non urgent.  How to contact the [facility] Attending or Consulting provider [place] or covering provider during after hours [place], for this patient?  Check the care team in [facility] [place] and look for a) attending/consulting [facility] provider listed and b) the [facility] team listed. Page or secure chat [phone]. Log into [email or web address] and use [place]'s universal password to access. If you do not have the password, please contact the hospital operator. Locate the [facility] provider you are looking for under [facility] and page to a number that you can be directly reached. If you still have difficulty reaching the provider, please page the [facility] (Director on Call) for the Hospitalists listed on amion for assistance.

## 2021-03-16 NOTE — Progress Notes (Signed)
BLE venous duplex has been completed.    Results can be found under chart review under CV PROC. 03/16/2021 10:05 AM Harriette Tovey RVT, RDMS

## 2021-03-16 NOTE — Plan of Care (Signed)

## 2021-03-17 DIAGNOSIS — D696 Thrombocytopenia, unspecified: Secondary | ICD-10-CM

## 2021-03-17 DIAGNOSIS — K259 Gastric ulcer, unspecified as acute or chronic, without hemorrhage or perforation: Secondary | ICD-10-CM

## 2021-03-17 DIAGNOSIS — E871 Hypo-osmolality and hyponatremia: Secondary | ICD-10-CM

## 2021-03-17 LAB — RENAL FUNCTION PANEL
Albumin: 2.7 g/dL — ABNORMAL LOW (ref 3.5–5.0)
Anion gap: 10 (ref 5–15)
BUN: 55 mg/dL — ABNORMAL HIGH (ref 6–20)
CO2: 19 mmol/L — ABNORMAL LOW (ref 22–32)
Calcium: 8.3 mg/dL — ABNORMAL LOW (ref 8.9–10.3)
Chloride: 104 mmol/L (ref 98–111)
Creatinine, Ser: 3.44 mg/dL — ABNORMAL HIGH (ref 0.61–1.24)
GFR, Estimated: 20 mL/min — ABNORMAL LOW (ref >60)
Glucose, Bld: 88 mg/dL (ref 70–99)
Phosphorus: 5.1 mg/dL — ABNORMAL HIGH (ref 2.5–4.6)
Potassium: 2.9 mmol/L — ABNORMAL LOW (ref 3.5–5.1)
Sodium: 133 mmol/L — ABNORMAL LOW (ref 135–145)

## 2021-03-17 LAB — CBC
HCT: 19.5 % — ABNORMAL LOW (ref 39.0–52.0)
HCT: 19.8 % — ABNORMAL LOW (ref 39.0–52.0)
Hemoglobin: 6.9 g/dL — CL (ref 13.0–17.0)
Hemoglobin: 6.7 g/dL — CL (ref 13.0–17.0)
MCH: 34.5 pg — ABNORMAL HIGH (ref 26.0–34.0)
MCH: 33.8 pg (ref 26.0–34.0)
MCHC: 35.4 g/dL (ref 30.0–36.0)
MCHC: 33.8 g/dL (ref 30.0–36.0)
MCV: 97.5 fL (ref 80.0–100.0)
MCV: 100 fL (ref 80.0–100.0)
Platelets: 91 10*3/uL — ABNORMAL LOW (ref 150–400)
Platelets: 91 10*3/uL — ABNORMAL LOW (ref 150–400)
RBC: 2 MIL/uL — ABNORMAL LOW (ref 4.22–5.81)
RBC: 1.98 MIL/uL — ABNORMAL LOW (ref 4.22–5.81)
RDW: 18.8 % — ABNORMAL HIGH (ref 11.5–15.5)
RDW: 19.4 % — ABNORMAL HIGH (ref 11.5–15.5)
WBC: 7.6 10*3/uL (ref 4.0–10.5)
WBC: 6.5 10*3/uL (ref 4.0–10.5)
nRBC: 0 % (ref 0.0–0.2)
nRBC: 0 % (ref 0.0–0.2)

## 2021-03-17 LAB — COMPREHENSIVE METABOLIC PANEL
ALT: 29 U/L (ref 0–44)
AST: 87 U/L — ABNORMAL HIGH (ref 15–41)
Albumin: 2.7 g/dL — ABNORMAL LOW (ref 3.5–5.0)
Alkaline Phosphatase: 112 U/L (ref 38–126)
Anion gap: 8 (ref 5–15)
BUN: 54 mg/dL — ABNORMAL HIGH (ref 6–20)
CO2: 21 mmol/L — ABNORMAL LOW (ref 22–32)
Calcium: 8.5 mg/dL — ABNORMAL LOW (ref 8.9–10.3)
Chloride: 106 mmol/L (ref 98–111)
Creatinine, Ser: 3.55 mg/dL — ABNORMAL HIGH (ref 0.61–1.24)
GFR, Estimated: 19 mL/min — ABNORMAL LOW (ref >60)
Glucose, Bld: 90 mg/dL (ref 70–99)
Potassium: 2.9 mmol/L — ABNORMAL LOW (ref 3.5–5.1)
Sodium: 135 mmol/L (ref 135–145)
Total Bilirubin: 10.1 mg/dL — ABNORMAL HIGH (ref 0.3–1.2)
Total Protein: 6.3 g/dL — ABNORMAL LOW (ref 6.5–8.1)

## 2021-03-17 LAB — GRAM STAIN: Gram Stain: NONE SEEN

## 2021-03-17 LAB — PREPARE RBC (CROSSMATCH)

## 2021-03-17 MED ORDER — POTASSIUM CHLORIDE CRYS ER 20 MEQ PO TBCR
40.0000 meq | EXTENDED_RELEASE_TABLET | ORAL | Status: AC
  Administered 2021-03-17 (×2): 40 meq via ORAL
  Filled 2021-03-17 (×2): qty 2

## 2021-03-17 MED ORDER — SODIUM CHLORIDE 0.9 % IV SOLN
2.0000 g | INTRAVENOUS | Status: DC
  Administered 2021-03-17 – 2021-03-19 (×3): 2 g via INTRAVENOUS
  Filled 2021-03-17 (×3): qty 20

## 2021-03-17 MED ORDER — POTASSIUM CHLORIDE CRYS ER 20 MEQ PO TBCR
40.0000 meq | EXTENDED_RELEASE_TABLET | Freq: Once | ORAL | Status: AC
Start: 2021-03-17 — End: 2021-03-17
  Administered 2021-03-17: 21:00:00 40 meq via ORAL
  Filled 2021-03-17: qty 2

## 2021-03-17 MED ORDER — LACTULOSE 10 GM/15ML PO SOLN
30.0000 g | Freq: Two times a day (BID) | ORAL | Status: DC
  Administered 2021-03-17: 21:00:00 30 g via ORAL
  Filled 2021-03-17 (×2): qty 45

## 2021-03-17 MED ORDER — SODIUM CHLORIDE 0.9% IV SOLUTION
Freq: Once | INTRAVENOUS | Status: AC
  Administered 2021-03-19: 300 mL via INTRAVENOUS

## 2021-03-17 MED ORDER — SODIUM CHLORIDE 0.9% IV SOLUTION: Freq: Once | INTRAVENOUS | Status: AC

## 2021-03-17 NOTE — Progress Notes (Signed)
PROGRESS NOTE    Kyle Pitts  [id number] DOB: 11/21/1960 DOA: 03/15/2021 PCP: Default, Provider, MD   Brief Narrative:  Kyle Pitts is a 60 y.o. male with medical history significant of alcoholic cirrhosis with esophageal varices and septic arthritis of right knee who presented to ED after being noted to be more confused by sister on the morning of admission.  He had just recently been hospitalized 10/26-10/30 with likely alcoholic cirrhosis.  Patient had undergone EGD on 10/29 noting grade 2 esophageal varices along with gastric and duodenal ulcers. since getting home he reports to continued use of Mobic although appears to have been recommended to discontinue NSAIDs during his last hospitalization.  Last drink was reported prior to previous hospitalization last month.  He has reported dark stools.    Upon arrival to ED, he was hemodynamically stable. hemoglobin 7.9, creatinine 4.69, AST 140, ALT 51, total bilirubin 11.9, ammonia 153, and INR 1.5. CT scan of the abdomen and pelvis noted significant progression of ascites with generalized soft tissue edema consistent with anasarca with liver noted to be decreased in size with improve steatosis and mild nonspecific right colonic wall thickening.  COVID-19 and influenza screening were negative.  Patient had been given lactulose 30 g x 1 dose.  TRH called to admit.  Assessment & Plan:   Principal Problem:   Hepatic encephalopathy Active Problems:   Hyponatremia   Acute renal failure (ARF) (HCC)   Elevated LFTs   Alcoholic cirrhosis of liver with ascites (HCC)   Gastric ulcer due to nonsteroidal anti-inflammatory drug (NSAID)   Duodenal ulcer   Severe protein-calorie malnutrition (HCC)   Thrombocytopenia (HCC)   Metabolic acidosis   Hepatic encephalopathy secondary to decompensated alcoholic cirrhosis with ascites:  ammonia elevated at 156 upon admission, improved to 56 on 03/16/2021.  Just recently hospitalized for suspected  alcoholic cirrhosis.  Had extensive work-up GI for which alcoholic hepatitis was thought to be less likely by GI.  Noted to have autoimmune markers-IgG elevated to 2342, normal ceruloplasmin hep B surface antigen alpha antitrypsin, pending antimitochondrial antibody ANA anti-smooth muscle antibody.  Underwent EGD evaluation 10/29 noted to have gastric and duodenal ulcers.  During his last hospitalization he was not noted to be a candidate for transplant given recent alcohol use. Patient's MELD score is 35 with a 52.6% estimated 57-month mortality.  Seen by GI.  Started on 4 times daily lactulose.  Mental status improved, currently alert and oriented.  Also some suspicion of SBP for which she was started on Rocephin.  Underwent diagnostic and therapeutic paracentesis with 4 L of fluid removed on 03/16/2021.  Labs so far not indicative of SBP.  Will defer to GI about antibiotics.  They are planning to do EGD at some point in time.   Nonoliguric acute renal failure with metabolic acidosis: Patient reports no urine output in the last 1 to 2 days prior to admission.  Creatinine of 4.69 with BUN 56Upon admission, improving now down to 3.44.  CO2 also getting worse at 19 today..  Baseline creatinine previously had been around 1 and discharged on 10/30.  Likely hepatorenal syndrome.  Not much urine output and no improvement in creatinine.  Nephrology on board and managing and we appreciate their help.  Hypokalemia: 2.9.  Will replace.   Transaminitis and hyperbilirubinemia: Chronic. Alkaline phosphatase 221,  AST 140, ALT 51, and total bilirubin 11.9.  Total bilirubin appears to be improved from previous 22.8. -Continue to monitor   Macrocytic anemia secondary  to suspect GI bleed,Acute on chronic/gastric and duodenal ulcers: Patient reports having dark stools at home prior to coming in and admitted to still using Mobic.  Last EGD from 10/29 noted gastric and duodenal ulcers.  Hemoglobin on admission noted to be 7.9  with elevated MCV and MCH.  Previously had been 8.8 on discharge on 10/30.  Dropped to 5.8 on afternoon of 03/16/2021, received 2 unit of PRBC transfusion.  Hemoglobin still 6.9.  Will transfuse 1 more unit.  GI on board.  He remains on octreotide drip and twice daily PPI.  Defer to GI for timing of EGD.    Acute thrombocytopenia: Around 129 and stable since yesterday.  Likely secondary to chronic alcoholism.  Slightly lower today at 91.  Still no indication of transfusion.   Hyponatremia: Acute on chronic.  Suspected secondary to cirrhosis.  Currently 133.  Nephrology managing.   Severe protein calorie malnutrition: On admission albumin noted to be low at 1.7.  Will consult nutrition.   DVT prophylaxis: SCDs Start: 03/15/21 1420   Code Status: Full Code  Family Communication:  None present at bedside.  Plan of care discussed with patient in length and he verbalized understanding and agreed with it.  Status is: Inpatient  Remains inpatient appropriate because: Needs further management of possible GI bleed and acute renal failure.   Estimated body mass index is 32.48 kg/m as calculated from the following:   Height as of this encounter: 6' (1.829 m).   Weight as of this encounter: 108.6 kg.     Nutritional Assessment: Body mass index is 32.48 kg/m.Marland Kitchen Seen by dietician.  I agree with the assessment and plan as outlined below: Nutrition Status:   Skin Assessment: I have examined the patient's skin and I agree with the wound assessment as performed by the wound care RN as outlined below:    Consultants:  Nephrology GI  Procedures:  None  Antimicrobials:  Anti-infectives (From admission, onward)    Start     Dose/Rate Route Frequency Ordered Stop   03/17/21 1730  cefTRIAXone (ROCEPHIN) 2 g in sodium chloride 0.9 % 100 mL IVPB        2 g 200 mL/hr over 30 Minutes Intravenous Every 24 hours 03/17/21 0721     03/16/21 2200  rifaximin (XIFAXAN) tablet 550 mg        550 mg Oral 2  times daily 03/16/21 2048     03/15/21 1730  cefTRIAXone (ROCEPHIN) 1 g in sodium chloride 0.9 % 100 mL IVPB  Status:  Discontinued        1 g 200 mL/hr over 30 Minutes Intravenous Every 24 hours 03/15/21 1709 03/17/21 0721   03/15/21 1645  ciprofloxacin (CIPRO) IVPB 200 mg  Status:  Discontinued        200 mg 100 mL/hr over 60 Minutes Intravenous Every 24 hours 03/15/21 1644 03/15/21 1709          Subjective:  Patient seen and examined.  He has no complaints.  He is alert and oriented.  Slightly anxious.  Objective: Vitals:   03/17/21 0000 03/17/21 0116 03/17/21 0136 03/17/21 0502  BP: 99/62 109/62 (!) 114/50 97/68  Pulse: (!) 58  63 62  Resp: 16 15 18 15   Temp: 97.6 F (36.4 C) (!) 97.2 F (36.2 C) 97.9 F (36.6 C) (!) 97.2 F (36.2 C)  TempSrc: Oral Oral Oral Oral  SpO2: 97% 96% 98% 98%  Weight:    108.6 kg  Height:  Intake/Output Summary (Last 24 hours) at 03/17/2021 1039 Last data filed at 03/17/2021 1013 Gross per 24 hour  Intake 2859.04 ml  Output 775 ml  Net 2084.04 ml    Filed Weights   03/16/21 0027 03/16/21 0509 03/17/21 0502  Weight: 111.9 kg 111.9 kg 108.6 kg    Examination:  General exam: Appears calm and comfortable, obese and slightly icteric, Respiratory system: Clear to auscultation. Respiratory effort normal. Cardiovascular system: S1 & S2 heard, RRR. No JVD, murmurs, rubs, gallops or clicks.  +3 pitting edema bilateral lower extremity Gastrointestinal system: Abdomen is soft, slightly distended, nontender with positive fluid shift. No organomegaly or masses felt. Normal bowel sounds heard. Central nervous system: Alert and oriented. No focal neurological deficits. Extremities: Symmetric 5 x 5 power. Skin: No rashes, lesions or ulcers.  Psychiatry: Judgement and insight appear poor   Data Reviewed: I have personally reviewed following labs and imaging studies  CBC: Recent Labs  Lab 03/15/21 0958 03/15/21 1830 03/16/21 0323  03/16/21 1632 03/17/21 0638  WBC 10.0 8.4 8.5 7.3 7.6  NEUTROABS 7.1  --   --   --   --   HGB 7.9* 7.4* 7.2* 5.8* 6.9*  HCT 23.4* 21.4* 20.3* 17.2* 19.5*  MCV 106.4* 104.4* 104.1* 105.5* 97.5  PLT 129* 123* 124* 111* 91*    Basic Metabolic Panel: Recent Labs  Lab 03/15/21 0958 03/16/21 0323 03/16/21 1037 03/17/21 0638  NA 131* 133*  --  133*  135  K 3.8 3.6  --  2.9*  2.9*  CL 103 104  --  104  106  CO2 21* 19*  --  19*  21*  GLUCOSE 103* 106*  --  88  90  BUN 56* 60*  --  55*  54*  CREATININE 4.69* 4.53*  --  3.44*  3.55*  CALCIUM 8.4* 8.6*  --  8.3*  8.5*  MG  --   --  2.2  --   PHOS  --   --   --  5.1*    GFR: Estimated Creatinine Clearance: 28.9 mL/min (A) (by C-G formula based on SCr of 3.55 mg/dL (H)). Liver Function Tests: Recent Labs  Lab 03/15/21 0958 03/16/21 0323 03/17/21 0638  AST 140* 134* 87*  ALT 51* 42 29  ALKPHOS 221* 180* 112  BILITOT 11.9* 11.7* 10.1*  PROT 7.5 7.5 6.3*  ALBUMIN 1.7* 2.4* 2.7*  2.7*    No results for input(s): LIPASE, AMYLASE in the last 168 hours. Recent Labs  Lab 03/15/21 0958 03/16/21 1037  AMMONIA 153* 56*    Coagulation Profile: Recent Labs  Lab 03/15/21 1239  INR 1.5*    Cardiac Enzymes: No results for input(s): CKTOTAL, CKMB, CKMBINDEX, TROPONINI in the last 168 hours. BNP (last 3 results) No results for input(s): PROBNP in the last 8760 hours. HbA1C: No results for input(s): HGBA1C in the last 72 hours. CBG: Recent Labs  Lab 03/15/21 1013  GLUCAP 82    Lipid Profile: No results for input(s): CHOL, HDL, LDLCALC, TRIG, CHOLHDL, LDLDIRECT in the last 72 hours. Thyroid Function Tests: No results for input(s): TSH, T4TOTAL, FREET4, T3FREE, THYROIDAB in the last 72 hours. Anemia Panel: No results for input(s): VITAMINB12, FOLATE, FERRITIN, TIBC, IRON, RETICCTPCT in the last 72 hours. Sepsis Labs: No results for input(s): PROCALCITON, LATICACIDVEN in the last 168 hours.  Recent Results  (from the past 240 hour(s))  Resp Panel by RT-PCR (Flu A&B, Covid) Nasopharyngeal Swab     Status: None   Collection  Time: 03/15/21 10:14 AM   Specimen: Nasopharyngeal Swab; Nasopharyngeal(NP) swabs in vial transport medium  Result Value Ref Range Status   SARS Coronavirus 2 by RT PCR NEGATIVE NEGATIVE Final    Comment: (NOTE) SARS-CoV-2 target nucleic acids are NOT DETECTED.  The SARS-CoV-2 RNA is generally detectable in upper respiratory specimens during the acute phase of infection. The lowest concentration of SARS-CoV-2 viral copies this assay can detect is 138 copies/mL. A negative result does not preclude SARS-Cov-2 infection and should not be used as the sole basis for treatment or other patient management decisions. A negative result may occur with  improper specimen collection/handling, submission of specimen other than nasopharyngeal swab, presence of viral mutation(s) within the areas targeted by this assay, and inadequate number of viral copies(<138 copies/mL). A negative result must be combined with clinical observations, patient history, and epidemiological information. The expected result is Negative.  Fact Sheet for Patients:  BloggerCourse.com  Fact Sheet for Healthcare Providers:  SeriousBroker.it  This test is no t yet approved or cleared by the [place] FDA and  has been authorized for detection and/or diagnosis of SARS-CoV-2 by FDA under an Emergency Use Authorization (EUA). This EUA will remain  in effect (meaning this test can be used) for the duration of the COVID-19 declaration under Section 564(b)(1) of the Act, 21 U.S.C.section 360bbb-3(b)(1), unless the authorization is terminated  or revoked sooner.       Influenza A by PCR NEGATIVE NEGATIVE Final   Influenza B by PCR NEGATIVE NEGATIVE Final    Comment: (NOTE) The Xpert Xpress SARS-CoV-2/FLU/RSV plus assay is intended as an aid in the  diagnosis of influenza from Nasopharyngeal swab specimens and should not be used as a sole basis for treatment. Nasal washings and aspirates are unacceptable for Xpert Xpress SARS-CoV-2/FLU/RSV testing.  Fact Sheet for Patients: BloggerCourse.com  Fact Sheet for Healthcare Providers: SeriousBroker.it  This test is not yet approved or cleared by the [place] FDA and has been authorized for detection and/or diagnosis of SARS-CoV-2 by FDA under an Emergency Use Authorization (EUA). This EUA will remain in effect (meaning this test can be used) for the duration of the COVID-19 declaration under Section 564(b)(1) of the Act, 21 U.S.C. section 360bbb-3(b)(1), unless the authorization is terminated or revoked.  Performed at [facility], [address], [place], [place] [address]   Gram stain     Status: None   Collection Time: 03/16/21 12:20 PM   Specimen: Fluid  Result Value Ref Range Status   Specimen Description FLUID  Final   Special Requests  PERITONEAL  Final   Gram Stain   Final    NO WBC SEEN NO ORGANISMS SEEN Performed at [facility], [address], [place], [place] [address]    Report Status 03/17/2021 FINAL  Final  Culture, body fluid w Gram Stain-bottle     Status: None (Preliminary result)   Collection Time: 03/16/21 12:20 PM   Specimen: Fluid  Result Value Ref Range Status   Specimen Description FLUID  Final   Special Requests  PERITONEAL  Final   Culture   Final    NO GROWTH < 24 HOURS Performed at [facility], [address] [place], [place] [address]    Report Status PENDING  Incomplete       Radiology Studies: CT ABDOMEN PELVIS WO CONTRAST  Result Date: 03/15/2021 CLINICAL DATA:  Edema in the abdomen and extremities for 1 month. History of cirrhosis. Jaundice  and abdominal pain. EXAM: CT ABDOMEN AND PELVIS WITHOUT CONTRAST TECHNIQUE: Multidetector CT imaging of the abdomen and  pelvis was performed following the standard protocol without IV contrast. COMPARISON:  Abdominopelvic CT 02/13/2021. FINDINGS: Lower chest: Images through the lung bases are mildly degraded by breathing artifact. There is mildly increased atelectasis in the right middle lobe. No significant pleural or pericardial effusion. Coronary artery atherosclerosis noted. Hepatobiliary: Again demonstrated are changes of advanced cirrhosis with diffuse contour irregularity of the liver and relative enlargement of the caudate and left lobes. No focal abnormalities are identified on noncontrast imaging. Compared with the recent study, the liver has decreased in size, and previously demonstrated diffuse steatosis as improved. Probable mild dependent sludge in the gallbladder lumen. No discrete gallstones, gallbladder wall thickening or biliary dilatation. Pancreas: Unremarkable. No pancreatic ductal dilatation or surrounding inflammatory changes. Spleen: Normal in size without focal abnormality. Adrenals/Urinary Tract: Both adrenal glands appear normal. Both kidneys appear unremarkable. No evidence of urinary tract calculus or hydronephrosis. The bladder appears normal. Stomach/Bowel: No enteric contrast administered. The stomach appears unremarkable for its degree of distention. Mild nonspecific right colonic wall thickening, likely related to cirrhosis and ascites. No evidence of bowel distension or surrounding focal inflammation. Vascular/Lymphatic: There are no enlarged abdominal or pelvic lymph nodes. Mild aortic and branch vessel atherosclerosis. Reproductive: The prostate gland and seminal vesicles appear unremarkable. Other: Interval significant increase in volume of ascites, now moderate to large. In addition, there is generalized soft tissue edema throughout the subcutaneous and intra-fat. No focal extraluminal fluid or air collections are seen. Musculoskeletal: No acute or significant osseous findings. Severe multilevel  thoracolumbar spondylosis with multilevel disc and endplate degeneration, grossly unchanged. There is the potential for nerve root encroachment, especially within the L5-S1 foramina bilaterally. IMPRESSION: 1. Significant progression of ascites with generalized soft tissue edema consistent with anasarca, likely related to cirrhosis and hepatic dysfunction. 2. Over the last month, the liver has decreased in size with improved steatosis. 3. Mild nonspecific right colonic wall thickening, likely related to liver disease/cirrhosis. 4.  Aortic Atherosclerosis (ICD10-I70.0). Electronically Signed   By: Richardean Sale M.D.   On: 03/15/2021 12:31   DG Chest 2 View  Result Date: 03/15/2021 CLINICAL DATA:  Altered mental status. EXAM: CHEST - 2 VIEW COMPARISON:  10/07/2020 chest radiograph FINDINGS: Cardiomediastinal silhouette is unremarkable. This is a low volume study with mild bibasilar atelectasis. There is no evidence of focal airspace disease, pulmonary edema, suspicious pulmonary nodule/mass, pleural effusion, or pneumothorax. No acute bony abnormalities are identified. IMPRESSION: Low volume study with mild bibasilar atelectasis. Electronically Signed   By: Margarette Canada M.D.   On: 03/15/2021 12:13   US Paracentesis  Result Date: 03/16/2021 INDICATION: Abdominal distention. Ascites due to alcoholic cirrhosis. Request for diagnostic and therapeutic paracentesis up to 4 L max. EXAM: ULTRASOUND GUIDED RIGHT LOWER QUADRANT PARACENTESIS MEDICATIONS: 1% plain lidocaine, 5 mL COMPLICATIONS: None immediate. PROCEDURE: Informed written consent was obtained from the patient after a discussion of the risks, benefits and alternatives to treatment. A timeout was performed prior to the initiation of the procedure. Initial ultrasound scanning demonstrates a large amount of ascites within the right lower abdominal quadrant. The right lower abdomen was prepped and draped in the usual sterile fashion. 1% lidocaine was used  for local anesthesia. Following this, a 19 gauge, 7-cm, Yueh catheter was introduced. An ultrasound image was saved for documentation purposes. The paracentesis was performed. The catheter was removed and a dressing was applied. The patient tolerated the  procedure well without immediate post procedural complication. FINDINGS: A total of approximately 4 L of clear yellow fluid was removed. Samples were sent to the laboratory as requested by the clinical team. IMPRESSION: Successful ultrasound-guided paracentesis yielding 4 liters of peritoneal fluid. Read by: Brayton ElKevin Bruning PA-C Electronically Signed   By: Simonne ComeJohn  Watts M.D.   On: 03/16/2021 13:21   VAS US LOWER EXTREMITY VENOUS (DVT) (7a-7p)  Result Date: 03/17/2021  Lower Venous DVT Study Patient Name:  Donivan ScullERRY A Chaney  Date of Exam:   03/16/2021 Medical Rec #: [id number]        Accession #:    [id number] Date of Birth: 06/11/1960        Patient Gender: M Patient Age:   [age] years Exam [facility] Procedure:      VAS US LOWER EXTREMITY VENOUS (DVT) Referring Phys: SOPHIA CACCAVALE --------------------------------------------------------------------------------  Indications: Edema (bilateral pitting). Other Indications: Cirrhosis, anasarca. Comparison Study: Previous exam 10/07/2020 was negative for DVT in LLE Performing Technologist: Ernestene MentionJody Hill RVT, RDMS  Examination Guidelines: A complete evaluation includes B-mode imaging, spectral Doppler, color Doppler, and power Doppler as needed of all accessible portions of each vessel. Bilateral testing is considered an integral part of a complete examination. Limited examinations for reoccurring indications may be performed as noted. The reflux portion of the exam is performed with the patient in reverse Trendelenburg.  +---------+---------------+---------+-----------+----------+--------------+ RIGHT    CompressibilityPhasicitySpontaneityPropertiesThrombus Aging  +---------+---------------+---------+-----------+----------+--------------+ CFV      Full           Yes      Yes                                 +---------+---------------+---------+-----------+----------+--------------+ SFJ      Full                                                        +---------+---------------+---------+-----------+----------+--------------+ FV Prox  Full           Yes      Yes                                 +---------+---------------+---------+-----------+----------+--------------+ FV Mid   Full           Yes      Yes                                 +---------+---------------+---------+-----------+----------+--------------+ FV DistalFull           Yes      Yes                                 +---------+---------------+---------+-----------+----------+--------------+ PFV      Full                                                        +---------+---------------+---------+-----------+----------+--------------+ POP      Full  Yes      Yes                                 +---------+---------------+---------+-----------+----------+--------------+ PTV      Full                                                        +---------+---------------+---------+-----------+----------+--------------+ PERO     Full                                                        +---------+---------------+---------+-----------+----------+--------------+   +---------+---------------+---------+-----------+----------+--------------+ LEFT     CompressibilityPhasicitySpontaneityPropertiesThrombus Aging +---------+---------------+---------+-----------+----------+--------------+ CFV      Full           Yes      Yes                                 +---------+---------------+---------+-----------+----------+--------------+ SFJ      Full                                                         +---------+---------------+---------+-----------+----------+--------------+ FV Prox  Full           Yes      Yes                                 +---------+---------------+---------+-----------+----------+--------------+ FV Mid   Full           Yes      Yes                                 +---------+---------------+---------+-----------+----------+--------------+ FV DistalFull           Yes      Yes                                 +---------+---------------+---------+-----------+----------+--------------+ PFV      Full                                                        +---------+---------------+---------+-----------+----------+--------------+ POP      Full           Yes      Yes                                 +---------+---------------+---------+-----------+----------+--------------+ PTV      Full                                                        +---------+---------------+---------+-----------+----------+--------------+  PERO     Full                                                        +---------+---------------+---------+-----------+----------+--------------+     Summary: BILATERAL: - No evidence of deep vein thrombosis seen in the lower extremities, bilaterally. - No evidence of superficial venous thrombosis in the lower extremities, bilaterally. -No evidence of popliteal cyst, bilaterally. RIGHT: - Ultrasound characteristics of enlarged lymph nodes are noted in the groin. Diffuse subcutaneous edema noted throughout.  LEFT: - Ultrasound characteristics of enlarged lymph nodes noted in the groin. Diffuse subcutaneous edema noted throughout.  *See table(s) above for measurements and observations. Electronically signed by Orlie Pollen on 03/17/2021 at 10:21:06 AM.    Final     Scheduled Meds:  lactulose  30 g Oral Q4H   midodrine  5 mg Oral TID WC   pantoprazole  40 mg Oral BID   rifaximin  550 mg Oral BID   sodium chloride flush  3 mL Intravenous  Q12H   Continuous Infusions:  albumin human Stopped (03/16/21 2330)   cefTRIAXone (ROCEPHIN)  IV     octreotide  (SANDOSTATIN)    IV infusion 50 mcg/hr (03/17/21 0138)     LOS: 2 days   Time spent: 30 minutes   Darliss Cheney, MD [facility]  03/17/2021, 10:39 AM  Please page via Shea Evans and do not message via secure chat for anything urgent. Secure chat can be used for anything non urgent.  How to contact the [facility] Attending or Consulting provider [place] or covering provider during after hours [place], for this patient?  Check the care team in [facility] and look for a) attending/consulting TR[facility] provider listed and b) the [facility] team listed. Page or secure chat [phone]. Log into [email or web address] and use [place]'s universal password to access. If you do not have the password, please contact the hospital operator. Locate the [facility] provider you are looking for under [facility] and page to a number that you can be directly reached. If you still have difficulty reaching the provider, please page the [facility] (Director on Call) for the Hospitalists listed on amion for assistance.

## 2021-03-17 NOTE — Plan of Care (Signed)
  Problem: Clinical Measurements: Goal: Ability to maintain clinical measurements within normal limits will improve Outcome: Progressing Goal: Diagnostic test results will improve Outcome: Progressing   Problem: Activity: Goal: Risk for activity intolerance will decrease Outcome: Progressing   Problem: Nutrition: Goal: Adequate nutrition will be maintained Outcome: Progressing   Problem: Pain Managment: Goal: General experience of comfort will improve Outcome: Progressing   

## 2021-03-17 NOTE — Progress Notes (Addendum)
Attending physician's note   I have taken an interval history, reviewed the chart and examined the patient. I agree with the Advanced Practitioner's note, impression, and recommendations as outlined.   1) Decompensated EtOH cirrhosis 2) Esophageal varices 3) Portal hypertensive gastropathy 4) Anemia 5) Hepatic encephalopathy (new diagnosis) 6) Thrombocytopenia 7) Hyponatremia 8) Hypokalemia 9) Coagulopathy 10) History of gastric and duodenal ulcers on EGD 01/2021 11) Ascites (negative for SBP) 11) AKI/Hepatorenal syndrome  - MELD 35; Child Pugh C - Transfusing another 1 unit today (total 2 units on this admission) - Follow-up posttransfusion CBC with additional blood products as needed per protocol.  Goal hemoglobin no more than 8 given history of esophageal varices - Continue octreotide x72 hours - Continue Rocephin x5 days - Continue rifaximin/lactulose - Continue high-dose PPI - Management of HRS per consulting Nephrology service - Will eventually plan for repeat EGD for diagnostic and therapeutic intent and band ligation later on this admission when more clinically stable - GI service will continue to follow  Doristine Locks, DO, FACG [phone] office                Daily Rounding Note  03/17/2021, 3:13 PM  LOS: 2 days   SUBJECTIVE:   Chief complaint:   Decompensated cirrhosis.  Had 4 loose, brown stools overnight and 2 or 3 so far today.  No nausea or vomiting.  Hungry with diet restricted to clear liquids.  No abdominal pain.  No difficulty breathing.  OBJECTIVE:         Vital signs in last 24 hours:    Temp:  [97.2 F (36.2 C)-98.4 F (36.9 C)] 98.4 F (36.9 C) (11/28 1050) Pulse Rate:  [56-63] 62 (11/28 0502) Resp:  [14-18] 14 (11/28 1050) BP: (97-114)/(50-76) 113/73 (11/28 1050) SpO2:  [96 %-100 %] 97 % (11/28 1050) Weight:  [108.6 kg] 108.6 kg (11/28 0502) Last BM Date: 03/17/21 Filed  Weights   03/16/21 0027 03/16/21 0509 03/17/21 0502  Weight: 111.9 kg 111.9 kg 108.6 kg   General: Looks jaundice, somewhat ill. Heart: RRR. Chest: Diminished breath sounds but clear.  No labored breathing or cough. Abdomen: Tense, protuberant/distended.  No tenderness.  Bowel sounds active. Extremities: Left sided pitting edema in the ankle/foot. Neuro/Psych: No asterixis.  Oriented x3.  Appropriate.  Intake/Output from previous day: 11/27 0701 - 11/28 0700 In: 1902 [P.O.:480; I.V.:242.6; Blood:713; IV Piggyback:466.5] Out: 600 [Urine:600]  Intake/Output this shift: Total I/O In: 1437 [P.O.:1437] Out: 425 [Urine:425]  Lab Results: Recent Labs    03/16/21 0323 03/16/21 1632 03/17/21 0638  WBC 8.5 7.3 7.6  HGB 7.2* 5.8* 6.9*  HCT 20.3* 17.2* 19.5*  PLT 124* 111* 91*   BMET Recent Labs    03/15/21 0958 03/16/21 0323 03/17/21 0638  NA 131* 133* 133*  135  K 3.8 3.6 2.9*  2.9*  CL 103 104 104  106  CO2 21* 19* 19*  21*  GLUCOSE 103* 106* 88  90  BUN 56* 60* 55*  54*  CREATININE 4.69* 4.53* 3.44*  3.55*  CALCIUM 8.4* 8.6* 8.3*  8.5*   LFT Recent Labs    03/15/21 0958 03/16/21 0323 03/17/21 0638  PROT 7.5 7.5 6.3*  ALBUMIN 1.7* 2.4* 2.7*  2.7*  AST 140* 134* 87*  ALT 51* 42 29  ALKPHOS 221* 180* 112  BILITOT 11.9* 11.7* 10.1*   PT/INR Recent Labs    03/15/21 1239  LABPROT 18.4*  INR 1.5*   Hepatitis Panel No results for  input(s): HEPBSAG, HCVAB, HEPAIGM, HEPBIGM in the last 72 hours.  Studies/Results: US Paracentesis  Result Date: 03/16/2021 INDICATION: Abdominal distention. Ascites due to alcoholic cirrhosis. Request for diagnostic and therapeutic paracentesis up to 4 L max. EXAM: ULTRASOUND GUIDED RIGHT LOWER QUADRANT PARACENTESIS MEDICATIONS: 1% plain lidocaine, 5 mL COMPLICATIONS: None immediate. PROCEDURE: Informed written consent was obtained from the patient after a discussion of the risks, benefits and alternatives to treatment. A  timeout was performed prior to the initiation of the procedure. Initial ultrasound scanning demonstrates a large amount of ascites within the right lower abdominal quadrant. The right lower abdomen was prepped and draped in the usual sterile fashion. 1% lidocaine was used for local anesthesia. Following this, a 19 gauge, 7-cm, Yueh catheter was introduced. An ultrasound image was saved for documentation purposes. The paracentesis was performed. The catheter was removed and a dressing was applied. The patient tolerated the procedure well without immediate post procedural complication. FINDINGS: A total of approximately 4 L of clear yellow fluid was removed. Samples were sent to the laboratory as requested by the clinical team. IMPRESSION: Successful ultrasound-guided paracentesis yielding 4 liters of peritoneal fluid. Read by: Ascencion Dike PA-C Electronically Signed   By: Sandi Mariscal M.D.   On: 03/16/2021 13:21   VAS [place] LOWER EXTREMITY VENOUS (DVT) (7a-7p)  Result Date: 03/17/2021  Lower Venous DVT Study Patient Name:  Kyle Pitts  Date of Exam:   03/16/2021 Medical Rec #: [id number]        Accession #:    [id number] Date of Birth: [birth year]        Patient Gender: M Patient Age:   60 years Exam Location:  [facility] Procedure:      VAS [place] LOWER EXTREMITY VENOUS (DVT) Referring Phys: SOPHIA CACCAVALE --------------------------------------------------------------------------------  Indications: Edema (bilateral pitting). Other Indications: Cirrhosis, anasarca. Comparison Study: Previous exam 10/07/2020 was negative for DVT in LLE Performing Technologist: Rogelia Rohrer RVT, RDMS  Examination Guidelines: A complete evaluation includes B-mode imaging, spectral Doppler, color Doppler, and power Doppler as needed of all accessible portions of each vessel. Bilateral testing is considered an integral part of a complete examination. Limited examinations for reoccurring indications may be performed as noted. The  reflux portion of the exam is performed with the patient in reverse Trendelenburg.  +---------+---------------+---------+-----------+----------+--------------+ RIGHT    CompressibilityPhasicitySpontaneityPropertiesThrombus Aging +---------+---------------+---------+-----------+----------+--------------+ CFV      Full           Yes      Yes                                 +---------+---------------+---------+-----------+----------+--------------+ SFJ      Full                                                        +---------+---------------+---------+-----------+----------+--------------+ FV Prox  Full           Yes      Yes                                 +---------+---------------+---------+-----------+----------+--------------+ FV Mid   Full           Yes      Yes                                 +---------+---------------+---------+-----------+----------+--------------+  FV DistalFull           Yes      Yes                                 +---------+---------------+---------+-----------+----------+--------------+ PFV      Full                                                        +---------+---------------+---------+-----------+----------+--------------+ POP      Full           Yes      Yes                                 +---------+---------------+---------+-----------+----------+--------------+ PTV      Full                                                        +---------+---------------+---------+-----------+----------+--------------+ PERO     Full                                                        +---------+---------------+---------+-----------+----------+--------------+   +---------+---------------+---------+-----------+----------+--------------+ LEFT     CompressibilityPhasicitySpontaneityPropertiesThrombus Aging +---------+---------------+---------+-----------+----------+--------------+ CFV      Full           Yes       Yes                                 +---------+---------------+---------+-----------+----------+--------------+ SFJ      Full                                                        +---------+---------------+---------+-----------+----------+--------------+ FV Prox  Full           Yes      Yes                                 +---------+---------------+---------+-----------+----------+--------------+ FV Mid   Full           Yes      Yes                                 +---------+---------------+---------+-----------+----------+--------------+ FV DistalFull           Yes      Yes                                 +---------+---------------+---------+-----------+----------+--------------+ PFV      Full                                                        +---------+---------------+---------+-----------+----------+--------------+  POP      Full           Yes      Yes                                 +---------+---------------+---------+-----------+----------+--------------+ PTV      Full                                                        +---------+---------------+---------+-----------+----------+--------------+ PERO     Full                                                        +---------+---------------+---------+-----------+----------+--------------+     Summary: BILATERAL: - No evidence of deep vein thrombosis seen in the lower extremities, bilaterally. - No evidence of superficial venous thrombosis in the lower extremities, bilaterally. -No evidence of popliteal cyst, bilaterally. RIGHT: - Ultrasound characteristics of enlarged lymph nodes are noted in the groin. Diffuse subcutaneous edema noted throughout.  LEFT: - Ultrasound characteristics of enlarged lymph nodes noted in the groin. Diffuse subcutaneous edema noted throughout.  *See table(s) above for measurements and observations. Electronically signed by Gerarda Fraction on 03/17/2021 at 10:21:06 AM.     Final     Scheduled Meds:  sodium chloride   Intravenous Once   lactulose  30 g Oral Q4H   midodrine  5 mg Oral TID WC   pantoprazole  40 mg Oral BID   potassium chloride  40 mEq Oral Q4H   rifaximin  550 mg Oral BID   sodium chloride flush  3 mL Intravenous Q12H   Continuous Infusions:  albumin human 50 g (03/17/21 1117)   cefTRIAXone (ROCEPHIN)  IV     octreotide  (SANDOSTATIN)    IV infusion 50 mcg/hr (03/17/21 1324)   PRN Meds:.albuterol, ondansetron **OR** ondansetron (ZOFRAN) IV   ASSESMENT:      Cirrhosis of the liver presumed secondary to EtOH.  Now decompensated with ascites (no SBP), hepatic encephalopathy.  Meld 35.  LFTs improving.  GI bleed, melena.  EGD 01/2021 with nonbleeding, grade 2 esophageal varices not ligated, portal hypertensive gastropathy, gastric and duodenal ulcers.  Hgb 5.8 >> 1 PRBC yesterday >> 6.9 this AM >> 2nd PRBC.  Suspect the bleeding is coming from portal gastropathy.  Protonix 40 mg po bid in place.     AKI.  BUN/creatinine improving.  Urine na < 10.  Receiving albumin infusions since 11/27, now BID through 11/30.    Ascites.  4 L tap 11/27, no evidence for SBP.  Day 3 Rocephin for SBP prophylaxis.  HE.  On Xifaxan, large dose lactulose.  Ammonia level 153 ... 56.  Thrombocytopenia.  Platelets now 91K.     Hyponatremia at 133.     Hypokalemia at 2.9.  Enlarged lymph nodes of the groin per Doppler ultrasound studies of lower extremities.     PLAN     72 h octreotide finishes tomorrow at 1645.  Follow CBC.   Rocephin for 5 d total.  Reduce lactulose from every 4 hours to bid.   Advance to heart  healthy diet.  ?  Timing of next EGD, should probably address the varices at that point and relook at ulcers.    Azucena Freed  03/17/2021, 3:13 PM Phone [phone]

## 2021-03-17 NOTE — Progress Notes (Signed)
Critical Hgb called to RN. H/H drawn prior to previously ordered blood transfusion. Ordered dc'd for 2unit of blood per Dr Jacqulyn Bath. Oncoming RN made aware.

## 2021-03-17 NOTE — Progress Notes (Signed)
[facility] NEPHROLOGY PROGRESS NOTE  Assessment/ Plan:  # Acute kidney injury, nonoliguric: Likely type I hepatorenal syndrome.  UA bland, urine sodium <10 and kidneys unremarkable and CT scan.  The serum creatinine level is trending down with albumin, midodrine and octreotide.  I will continue current management and strict ins and outs and daily lab.  No need for dialysis.  # Hyponatremia: Secondary to cirrhosis and inappropriate ADH activation from decreased effective arterial blood volume.  Continue to limit oral fluid intake and monitor with efforts at volume expansion using albumin.  Sodium level is stable.  #Acute hepatic encephalopathy: Improved mental status, treated with lactulose.  #Acute decompensated liver cirrhosis: Seen by GI.  Diagnostic paracentesis was negative for SBP.  Added Xifaxan.  #  Anemia: Likely secondary to cirrhosis with associated GI bleed from recent gastritis and duodenitis in the setting of ongoing meloxicam use.  PPI, blood transfusion per primary team.  #Thrombocytopenia: Secondary to portal hypertension and splenic sequestration.   Subjective: Seen and examined at bedside.  He reports feeling good without any complaint or concern.  Urine output is around 600 cc recorded.  Labs are improving.  No new event. Objective Vital signs in last 24 hours: Vitals:   03/17/21 0000 03/17/21 0116 03/17/21 0136 03/17/21 0502  BP: 99/62 109/62 (!) 114/50 97/68  Pulse: (!) 58  63 62  Resp: 16 15 18 15   Temp: 97.6 F (36.4 C) (!) 97.2 F (36.2 C) 97.9 F (36.6 C) (!) 97.2 F (36.2 C)  TempSrc: Oral Oral Oral Oral  SpO2: 97% 96% 98% 98%  Weight:    108.6 kg  Height:       Weight change: -3.311 kg  Intake/Output Summary (Last 24 hours) at 03/17/2021 0925 Last data filed at 03/17/2021 0758 Gross per 24 hour  Intake 2859.04 ml  Output 600 ml  Net 2259.04 ml       Labs: Basic Metabolic Panel: Recent Labs  Lab 03/15/21 0958 03/16/21 0323  03/17/21 0638  NA 131* 133* 133*  135  K 3.8 3.6 2.9*  2.9*  CL 103 104 104  106  CO2 21* 19* 19*  21*  GLUCOSE 103* 106* 88  90  BUN 56* 60* 55*  54*  CREATININE 4.69* 4.53* 3.44*  3.55*  CALCIUM 8.4* 8.6* 8.3*  8.5*  PHOS  --   --  5.1*   Liver Function Tests: Recent Labs  Lab 03/15/21 0958 03/16/21 0323 03/17/21 0638  AST 140* 134* 87*  ALT 51* 42 29  ALKPHOS 221* 180* 112  BILITOT 11.9* 11.7* 10.1*  PROT 7.5 7.5 6.3*  ALBUMIN 1.7* 2.4* 2.7*  2.7*   No results for input(s): LIPASE, AMYLASE in the last 168 hours. Recent Labs  Lab 03/15/21 0958 03/16/21 1037  AMMONIA 153* 56*   CBC: Recent Labs  Lab 03/15/21 0958 03/15/21 1830 03/16/21 0323 03/16/21 1632 03/17/21 0638  WBC 10.0 8.4 8.5 7.3 7.6  NEUTROABS 7.1  --   --   --   --   HGB 7.9* 7.4* 7.2* 5.8* 6.9*  HCT 23.4* 21.4* 20.3* 17.2* 19.5*  MCV 106.4* 104.4* 104.1* 105.5* 97.5  PLT 129* 123* 124* 111* 91*   Cardiac Enzymes: No results for input(s): CKTOTAL, CKMB, CKMBINDEX, TROPONINI in the last 168 hours. CBG: Recent Labs  Lab 03/15/21 1013  GLUCAP 82    Iron Studies: No results for input(s): IRON, TIBC, TRANSFERRIN, FERRITIN in the last 72 hours. Studies/Results: CT ABDOMEN PELVIS WO CONTRAST  Result Date: 03/15/2021 CLINICAL DATA:  Edema in the abdomen and extremities for 1 month. History of cirrhosis. Jaundice and abdominal pain. EXAM: CT ABDOMEN AND PELVIS WITHOUT CONTRAST TECHNIQUE: Multidetector CT imaging of the abdomen and pelvis was performed following the standard protocol without IV contrast. COMPARISON:  Abdominopelvic CT 02/13/2021. FINDINGS: Lower chest: Images through the lung bases are mildly degraded by breathing artifact. There is mildly increased atelectasis in the right middle lobe. No significant pleural or pericardial effusion. Coronary artery atherosclerosis noted. Hepatobiliary: Again demonstrated are changes of advanced cirrhosis with diffuse contour irregularity of  the liver and relative enlargement of the caudate and left lobes. No focal abnormalities are identified on noncontrast imaging. Compared with the recent study, the liver has decreased in size, and previously demonstrated diffuse steatosis as improved. Probable mild dependent sludge in the gallbladder lumen. No discrete gallstones, gallbladder wall thickening or biliary dilatation. Pancreas: Unremarkable. No pancreatic ductal dilatation or surrounding inflammatory changes. Spleen: Normal in size without focal abnormality. Adrenals/Urinary Tract: Both adrenal glands appear normal. Both kidneys appear unremarkable. No evidence of urinary tract calculus or hydronephrosis. The bladder appears normal. Stomach/Bowel: No enteric contrast administered. The stomach appears unremarkable for its degree of distention. Mild nonspecific right colonic wall thickening, likely related to cirrhosis and ascites. No evidence of bowel distension or surrounding focal inflammation. Vascular/Lymphatic: There are no enlarged abdominal or pelvic lymph nodes. Mild aortic and branch vessel atherosclerosis. Reproductive: The prostate gland and seminal vesicles appear unremarkable. Other: Interval significant increase in volume of ascites, now moderate to large. In addition, there is generalized soft tissue edema throughout the subcutaneous and intra-fat. No focal extraluminal fluid or air collections are seen. Musculoskeletal: No acute or significant osseous findings. Severe multilevel thoracolumbar spondylosis with multilevel disc and endplate degeneration, grossly unchanged. There is the potential for nerve root encroachment, especially within the L5-S1 foramina bilaterally. IMPRESSION: 1. Significant progression of ascites with generalized soft tissue edema consistent with anasarca, likely related to cirrhosis and hepatic dysfunction. 2. Over the last month, the liver has decreased in size with improved steatosis. 3. Mild nonspecific right  colonic wall thickening, likely related to liver disease/cirrhosis. 4.  Aortic Atherosclerosis (ICD10-I70.0). Electronically Signed   By: Carey Bullocks M.D.   On: 03/15/2021 12:31   DG Chest 2 View  Result Date: 03/15/2021 CLINICAL DATA:  Altered mental status. EXAM: CHEST - 2 VIEW COMPARISON:  10/07/2020 chest radiograph FINDINGS: Cardiomediastinal silhouette is unremarkable. This is a low volume study with mild bibasilar atelectasis. There is no evidence of focal airspace disease, pulmonary edema, suspicious pulmonary nodule/mass, pleural effusion, or pneumothorax. No acute bony abnormalities are identified. IMPRESSION: Low volume study with mild bibasilar atelectasis. Electronically Signed   By: Harmon Pier M.D.   On: 03/15/2021 12:13   US Paracentesis  Result Date: 03/16/2021 INDICATION: Abdominal distention. Ascites due to alcoholic cirrhosis. Request for diagnostic and therapeutic paracentesis up to 4 L max. EXAM: ULTRASOUND GUIDED RIGHT LOWER QUADRANT PARACENTESIS MEDICATIONS: 1% plain lidocaine, 5 mL COMPLICATIONS: None immediate. PROCEDURE: Informed written consent was obtained from the patient after a discussion of the risks, benefits and alternatives to treatment. A timeout was performed prior to the initiation of the procedure. Initial ultrasound scanning demonstrates a large amount of ascites within the right lower abdominal quadrant. The right lower abdomen was prepped and draped in the usual sterile fashion. 1% lidocaine was used for local anesthesia. Following this, a 19 gauge, 7-cm, Yueh catheter was introduced. An ultrasound image was saved for documentation  purposes. The paracentesis was performed. The catheter was removed and a dressing was applied. The patient tolerated the procedure well without immediate post procedural complication. FINDINGS: A total of approximately 4 L of clear yellow fluid was removed. Samples were sent to the laboratory as requested by the clinical team.  IMPRESSION: Successful ultrasound-guided paracentesis yielding 4 liters of peritoneal fluid. Read by: Brayton El PA-C Electronically Signed   By: Simonne Come M.D.   On: 03/16/2021 13:21   VAS [place] LOWER EXTREMITY VENOUS (DVT) (7a-7p)  Result Date: 03/16/2021  Lower Venous DVT Study Patient Name:  JADARIOUS DOBBINS  Date of Exam:   03/16/2021 Medical Rec #: [id number]        Accession #:    [id number] Date of Birth: 01/16/1961        Patient Gender: M Patient Age:   10 years Exam Location:  [facility] Procedure:      VAS [place] LOWER EXTREMITY VENOUS (DVT) Referring Phys: SOPHIA CACCAVALE --------------------------------------------------------------------------------  Indications: Edema (bilateral pitting). Other Indications: Cirrhosis, anasarca. Comparison Study: Previous exam 10/07/2020 was negative for DVT in LLE Performing Technologist: Ernestene Mention RVT, RDMS  Examination Guidelines: A complete evaluation includes B-mode imaging, spectral Doppler, color Doppler, and power Doppler as needed of all accessible portions of each vessel. Bilateral testing is considered an integral part of a complete examination. Limited examinations for reoccurring indications may be performed as noted. The reflux portion of the exam is performed with the patient in reverse Trendelenburg.  +---------+---------------+---------+-----------+----------+--------------+ RIGHT    CompressibilityPhasicitySpontaneityPropertiesThrombus Aging +---------+---------------+---------+-----------+----------+--------------+ CFV      Full           Yes      Yes                                 +---------+---------------+---------+-----------+----------+--------------+ SFJ      Full                                                        +---------+---------------+---------+-----------+----------+--------------+ FV Prox  Full           Yes      Yes                                  +---------+---------------+---------+-----------+----------+--------------+ FV Mid   Full           Yes      Yes                                 +---------+---------------+---------+-----------+----------+--------------+ FV DistalFull           Yes      Yes                                 +---------+---------------+---------+-----------+----------+--------------+ PFV      Full                                                        +---------+---------------+---------+-----------+----------+--------------+  POP      Full           Yes      Yes                                 +---------+---------------+---------+-----------+----------+--------------+ PTV      Full                                                        +---------+---------------+---------+-----------+----------+--------------+ PERO     Full                                                        +---------+---------------+---------+-----------+----------+--------------+   +---------+---------------+---------+-----------+----------+--------------+ LEFT     CompressibilityPhasicitySpontaneityPropertiesThrombus Aging +---------+---------------+---------+-----------+----------+--------------+ CFV      Full           Yes      Yes                                 +---------+---------------+---------+-----------+----------+--------------+ SFJ      Full                                                        +---------+---------------+---------+-----------+----------+--------------+ FV Prox  Full           Yes      Yes                                 +---------+---------------+---------+-----------+----------+--------------+ FV Mid   Full           Yes      Yes                                 +---------+---------------+---------+-----------+----------+--------------+ FV DistalFull           Yes      Yes                                  +---------+---------------+---------+-----------+----------+--------------+ PFV      Full                                                        +---------+---------------+---------+-----------+----------+--------------+ POP      Full           Yes      Yes                                 +---------+---------------+---------+-----------+----------+--------------+ PTV  Full                                                        +---------+---------------+---------+-----------+----------+--------------+ PERO     Full                                                        +---------+---------------+---------+-----------+----------+--------------+     Summary: BILATERAL: - No evidence of deep vein thrombosis seen in the lower extremities, bilaterally. - No evidence of superficial venous thrombosis in the lower extremities, bilaterally. -No evidence of popliteal cyst, bilaterally. RIGHT: - Ultrasound characteristics of enlarged lymph nodes are noted in the groin. Diffuse subcutaneous edema noted throughout.  LEFT: - Ultrasound characteristics of enlarged lymph nodes noted in the groin. Diffuse subcutaneous edema noted throughout.  *See table(s) above for measurements and observations.    Preliminary     Medications: Infusions:  albumin human Stopped (03/16/21 2330)   cefTRIAXone (ROCEPHIN)  IV     octreotide  (SANDOSTATIN)    IV infusion 50 mcg/hr (03/17/21 0138)    Scheduled Medications:  lactulose  30 g Oral Q4H   midodrine  5 mg Oral TID WC   pantoprazole  40 mg Oral BID   rifaximin  550 mg Oral BID   sodium chloride flush  3 mL Intravenous Q12H    have reviewed scheduled and prn medications.  Physical Exam: General:NAD, comfortable Heart:RRR, s1s2 nl Lungs:clear b/l, no crackle Abdomen:soft, Non-tender, non-distended Extremities:b/l leg edema + Neurology: Alert, awake and following commands  Kyle Pitts Kyle Pitts 03/17/2021,9:25 AM  LOS: 2 days

## 2021-03-18 ENCOUNTER — Inpatient Hospital Stay (HOSPITAL_COMMUNITY): Payer: Medicaid Other

## 2021-03-18 HISTORY — PX: IR PARACENTESIS: IMG2679

## 2021-03-18 LAB — CBC
HCT: 22 % — ABNORMAL LOW (ref 39.0–52.0)
HCT: 23 % — ABNORMAL LOW (ref 39.0–52.0)
Hemoglobin: 7.4 g/dL — ABNORMAL LOW (ref 13.0–17.0)
Hemoglobin: 7.9 g/dL — ABNORMAL LOW (ref 13.0–17.0)
MCH: 32.9 pg (ref 26.0–34.0)
MCH: 33.9 pg (ref 26.0–34.0)
MCHC: 33.6 g/dL (ref 30.0–36.0)
MCHC: 34.3 g/dL (ref 30.0–36.0)
MCV: 97.8 fL (ref 80.0–100.0)
MCV: 98.7 fL (ref 80.0–100.0)
Platelets: 85 10*3/uL — ABNORMAL LOW (ref 150–400)
Platelets: 83 10*3/uL — ABNORMAL LOW (ref 150–400)
RBC: 2.25 MIL/uL — ABNORMAL LOW (ref 4.22–5.81)
RBC: 2.33 MIL/uL — ABNORMAL LOW (ref 4.22–5.81)
RDW: 19.8 % — ABNORMAL HIGH (ref 11.5–15.5)
RDW: 19.9 % — ABNORMAL HIGH (ref 11.5–15.5)
WBC: 8.1 10*3/uL (ref 4.0–10.5)
WBC: 11.4 10*3/uL — ABNORMAL HIGH (ref 4.0–10.5)
nRBC: 0 % (ref 0.0–0.2)
nRBC: 0 % (ref 0.0–0.2)

## 2021-03-18 LAB — COMPREHENSIVE METABOLIC PANEL
ALT: 28 U/L (ref 0–44)
AST: 90 U/L — ABNORMAL HIGH (ref 15–41)
Albumin: 3 g/dL — ABNORMAL LOW (ref 3.5–5.0)
Alkaline Phosphatase: 112 U/L (ref 38–126)
Anion gap: 7 (ref 5–15)
BUN: 47 mg/dL — ABNORMAL HIGH (ref 6–20)
CO2: 19 mmol/L — ABNORMAL LOW (ref 22–32)
Calcium: 8.4 mg/dL — ABNORMAL LOW (ref 8.9–10.3)
Chloride: 107 mmol/L (ref 98–111)
Creatinine, Ser: 3.05 mg/dL — ABNORMAL HIGH (ref 0.61–1.24)
GFR, Estimated: 23 mL/min — ABNORMAL LOW (ref >60)
Glucose, Bld: 109 mg/dL — ABNORMAL HIGH (ref 70–99)
Potassium: 3.6 mmol/L (ref 3.5–5.1)
Sodium: 133 mmol/L — ABNORMAL LOW (ref 135–145)
Total Bilirubin: 10.5 mg/dL — ABNORMAL HIGH (ref 0.3–1.2)
Total Protein: 6.7 g/dL (ref 6.5–8.1)

## 2021-03-18 MED ORDER — ALBUMIN HUMAN 25 % IV SOLN
50.0000 g | Freq: Two times a day (BID) | INTRAVENOUS | Status: DC
  Administered 2021-03-18 – 2021-03-19 (×2): 50 g via INTRAVENOUS
  Filled 2021-03-18 (×2): qty 200

## 2021-03-18 MED ORDER — ADULT MULTIVITAMIN W/MINERALS CH
1.0000 | ORAL_TABLET | Freq: Every day | ORAL | Status: DC
  Administered 2021-03-18 – 2021-03-22 (×5): 1 via ORAL
  Filled 2021-03-18 (×4): qty 1

## 2021-03-18 MED ORDER — ENSURE ENLIVE PO LIQD
237.0000 mL | Freq: Two times a day (BID) | ORAL | Status: DC
  Administered 2021-03-18 – 2021-03-22 (×7): 237 mL via ORAL

## 2021-03-18 MED ORDER — LIDOCAINE HCL 1 % IJ SOLN
INTRAMUSCULAR | Status: AC
  Filled 2021-03-18: qty 20

## 2021-03-18 MED ORDER — ADULT MULTIVITAMIN W/MINERALS CH
ORAL_TABLET | ORAL | Status: AC
  Filled 2021-03-18: qty 1

## 2021-03-18 MED ORDER — LACTULOSE 10 GM/15ML PO SOLN
20.0000 g | Freq: Two times a day (BID) | ORAL | Status: DC
  Administered 2021-03-18 – 2021-03-22 (×6): 20 g via ORAL
  Filled 2021-03-18 (×7): qty 30

## 2021-03-18 MED ORDER — LIDOCAINE HCL (PF) 1 % IJ SOLN
INTRAMUSCULAR | Status: DC | PRN
  Administered 2021-03-18: 10 mL

## 2021-03-18 NOTE — Progress Notes (Signed)
Initial Nutrition Assessment  DOCUMENTATION CODES:  Obesity unspecified  INTERVENTION:  Add Ensure Plus High Protein po BID, each supplement provides 350 kcal and 20 grams of protein.   Add 8 pm nourishment snack - RD to order.  Add MVI with minerals daily.  Encourage PO and supplement intake.  Order daily weights to track fluid shifts.  NUTRITION DIAGNOSIS:  Increased nutrient needs related to chronic illness (cirrhosis) as evidenced by estimated needs.  GOAL:  Patient will meet greater than or equal to 90% of their needs  MONITOR:  PO intake, Supplement acceptance, Labs, Weight trends, I & O's  REASON FOR ASSESSMENT:  Consult Assessment of nutrition requirement/status  ASSESSMENT:  60 yo male with a PMH of alcoholic cirrhosis with esophageal varices and septic arthritis of right knee who presented to ED after being noted to be more confused by sister on the morning of admission.  He had just recently been hospitalized 10/26-10/30 with likely alcoholic cirrhosis.  Patient had undergone EGD on 10/29 noting grade 2 esophageal varices along with gastric and duodenal ulcers. since getting home he reports to continued use of Mobic although appears to have been recommended to discontinue NSAIDs during his last hospitalization.  Last drink was reported prior to previous hospitalization last month.  He has reported dark stools.  RD working remotely. Attempted to call patient's room phone. Pt did not answer.  Albumin is not an indicator of nutritional status, but rather an indicator of morbidity and mortality, and recovery from acute and chronic illness.  Per Epic, pt ate 100% of dinner last night.  Per Epic, pt's weight has increased since 11/21/2020. This may be fluid related given patient having ascites.  Of note, pt with severe BLE edema. This is likely masking loss in legs and contributing to weight gain.  Pt at risk for malnutrition given past EtOH abuse.  RD to order Ensure  BID, MVI with minerals, and 8 pm snack for cirrhosis.  Medications: reviewed; lactulose BID, midodrine TID, Protonix BID  Labs: reviewed; Na 133 (L), Glucose 109 (H), BUN 47 (H - trending down), Crt 3.05 (H - trending down)  NUTRITION - FOCUSED PHYSICAL EXAM: Unable to perform - defer to follow-up  Diet Order:   Diet Order             Diet Heart Room service appropriate? Yes; Fluid consistency: Thin  Diet effective now                  EDUCATION NEEDS:  No education needs have been identified at this time  Skin:  Skin Assessment: Reviewed RN Assessment (Jaundice)  Last BM:  03/17/21 - x4  Height:  Ht Readings from Last 1 Encounters:  03/16/21 6' (1.829 m)   Weight:  Wt Readings from Last 1 Encounters:  03/18/21 113.8 kg   BMI:  Body mass index is 34.03 kg/m.  Estimated Nutritional Needs:  Kcal:  2200-2400 Protein:  145-160 grams Fluid:  >2.2 L  Vertell Limber, RD, LDN (she/her/hers) Clinical Inpatient Dietitian RD Pager/After-Hours/Weekend Pager # in [phone]

## 2021-03-18 NOTE — Procedures (Signed)
Ultrasound-guided therapeutic paracentesis performed yielding 2.5 liters of bright yellow colored fluid.  No immediate complications. EBL is none.

## 2021-03-18 NOTE — H&P (View-Only) (Signed)
  Attending physician's note   I have taken an interval history, reviewed the chart and examined the patient. I agree with the Advanced Practitioner's note, impression, and recommendations as outlined.   1) Decompensated EtOH cirrhosis 2) Esophageal varices 3) Portal hypertensive gastropathy 4) Anemia 5) Hepatic encephalopathy (new diagnosis) 6) Thrombocytopenia 7) Hyponatremia 8) Hypokalemia 9) Coagulopathy 10) History of gastric and duodenal ulcers on EGD 01/2021 11) Ascites (negative for SBP) 12) AKI/Hepatorenal syndrome  - Plan for EGD tomorrow for diagnostic and potentially therapeutic intent - Did have esophageal varices on index EGD last month.  Was started on propranolol.  Depending on endoscopic findings, may either resume nonselective beta-blocker vs variceal ligation - Renal function continues to slowly improve with albumin, midodrine, octreotide.  Per Nephrology, may discontinue octreotide and albumin tomorrow if continued recovery - Continue Rocephin x5 days total then discontinue - Plan for therapeutic paracentesis.  Would limit fluid removal to <4L given HRS - Continue rifaximin/lactulose.  Mentation continues to improve - Was transfused 1 unit PRBCs again yesterday with H/H 7.4/22 today - I discussed his case with his sister by phone.   I discussed the risk/benefits of EGD with the patient today along with his sister by phone and both agreed to proceed with procedure as scheduled.  Thomasenia Dowse, DO, FACG [phone] office                Daily Rounding Note  03/18/2021, 8:55 AM  LOS: 3 days   SUBJECTIVE:   Chief complaint:  decompensated cirrhosis.      Eating 100% of meals.   Urine output 1.57 L yesterday.  At least 4 BM's yesterday.  3 bowel movements so far today, 1 in the wee hours, 2 this morning.  Rectum feels raw.  Total I/O of 2.4 L yesterday.  No activity OOB for at least 2 d.  Gets  SOB w speech due to abdominal distention which is recurrent since tap.     OBJECTIVE:         Vital signs in last 24 hours:    Temp:  [97.1 F (36.2 C)-98.9 F (37.2 C)] 98.9 F (37.2 C) (11/29 0447) Pulse Rate:  [57-65] 64 (11/29 0447) Resp:  [14-24] 15 (11/29 0447) BP: (110-127)/(71-79) 127/71 (11/29 0447) SpO2:  [91 %-100 %] 91 % (11/29 0447) Weight:  [113.8 kg] 113.8 kg (11/29 0447) Last BM Date: 03/18/21 Filed Weights   03/16/21 0509 03/17/21 0502 03/18/21 0447  Weight: 111.9 kg 108.6 kg 113.8 kg   General: looks ill, jaundiced.  comfortable   Heart: RRR Chest: clear bil.  Dyspnea w speaking Abdomen: Protuberant/distended, tense.  Nontender.  Bowel sounds active. Extremities: Pitting edema on the left foot. Neuro/Psych: Alert.  Appropriate.  Oriented x3.  No asterixis.  Intake/Output from previous day: 11/28 0701 - 11/29 0700 In: 4009.2 [P.O.:2577; I.V.:492.6; Blood:439; IV Piggyback:500.5] Out: 1570 [Urine:1570]  Intake/Output this shift: Total I/O In: 240 [P.O.:240] Out: 150 [Urine:150]  Lab Results: Recent Labs    03/17/21 0638 03/17/21 1630 03/18/21 0015  WBC 7.6 6.5 8.1  HGB 6.9* 6.7* 7.4*  HCT 19.5* 19.8* 22.0*  PLT 91* 91* 85*   BMET Recent Labs    03/16/21 0323 03/17/21 0638 03/18/21 0015  NA 133* 133*  135 133*  K 3.6 2.9*  2.9* 3.6  CL 104 104  106 107  CO2 19* 19*  21* 19*  GLUCOSE 106* 88  90 109*  BUN 60* 55*  54* 47*  CREATININE 4.53*   3.44*  3.55* 3.05*  CALCIUM 8.6* 8.3*  8.5* 8.4*   LFT Recent Labs    03/16/21 0323 03/17/21 0638 03/18/21 0015  PROT 7.5 6.3* 6.7  ALBUMIN 2.4* 2.7*  2.7* 3.0*  AST 134* 87* 90*  ALT 42 29 28  ALKPHOS 180* 112 112  BILITOT 11.7* 10.1* 10.5*   PT/INR Recent Labs    03/15/21 1239  LABPROT 18.4*  INR 1.5*   Hepatitis Panel No results for input(s): HEPBSAG, HCVAB, HEPAIGM, HEPBIGM in the last 72 hours.  Studies/Results: US Paracentesis  Result Date: 03/16/2021 INDICATION:  Abdominal distention. Ascites due to alcoholic cirrhosis. Request for diagnostic and therapeutic paracentesis up to 4 L max. EXAM: ULTRASOUND GUIDED RIGHT LOWER QUADRANT PARACENTESIS MEDICATIONS: 1% plain lidocaine, 5 mL COMPLICATIONS: None immediate. PROCEDURE: Informed written consent was obtained from the patient after a discussion of the risks, benefits and alternatives to treatment. A timeout was performed prior to the initiation of the procedure. Initial ultrasound scanning demonstrates a large amount of ascites within the right lower abdominal quadrant. The right lower abdomen was prepped and draped in the usual sterile fashion. 1% lidocaine was used for local anesthesia. Following this, a 19 gauge, 7-cm, Yueh catheter was introduced. An ultrasound image was saved for documentation purposes. The paracentesis was performed. The catheter was removed and a dressing was applied. The patient tolerated the procedure well without immediate post procedural complication. FINDINGS: A total of approximately 4 L of clear yellow fluid was removed. Samples were sent to the laboratory as requested by the clinical team. IMPRESSION: Successful ultrasound-guided paracentesis yielding 4 liters of peritoneal fluid. Read by: Ascencion Dike PA-C Electronically Signed   By: Sandi Mariscal M.D.   On: 03/16/2021 13:21   VAS [place] LOWER EXTREMITY VENOUS (DVT) (7a-7p)  Result Date: 03/17/2021  Lower Venous DVT Study Patient Name:  Kyle Pitts  Date of Exam:   03/16/2021 Medical Rec #: [id number]        Accession #:    [id number] Date of Birth: 02-06-1961        Patient Gender: M Patient Age:   3 years Exam Location:  [facility] Procedure:      VAS [place] LOWER EXTREMITY VENOUS (DVT) Referring Phys: SOPHIA CACCAVALE --------------------------------------------------------------------------------  Indications: Edema (bilateral pitting). Other Indications: Cirrhosis, anasarca. Comparison Study: Previous exam 10/07/2020 was negative  for DVT in LLE Performing Technologist: Rogelia Rohrer RVT, RDMS  Examination Guidelines: A complete evaluation includes B-mode imaging, spectral Doppler, color Doppler, and power Doppler as needed of all accessible portions of each vessel. Bilateral testing is considered an integral part of a complete examination. Limited examinations for reoccurring indications may be performed as noted. The reflux portion of the exam is performed with the patient in reverse Trendelenburg.  +---------+---------------+---------+-----------+----------+--------------+ RIGHT    CompressibilityPhasicitySpontaneityPropertiesThrombus Aging +---------+---------------+---------+-----------+----------+--------------+ CFV      Full           Yes      Yes                                 +---------+---------------+---------+-----------+----------+--------------+ SFJ      Full                                                        +---------+---------------+---------+-----------+----------+--------------+  FV Prox  Full           Yes      Yes                                 +---------+---------------+---------+-----------+----------+--------------+ FV Mid   Full           Yes      Yes                                 +---------+---------------+---------+-----------+----------+--------------+ FV DistalFull           Yes      Yes                                 +---------+---------------+---------+-----------+----------+--------------+ PFV      Full                                                        +---------+---------------+---------+-----------+----------+--------------+ POP      Full           Yes      Yes                                 +---------+---------------+---------+-----------+----------+--------------+ PTV      Full                                                        +---------+---------------+---------+-----------+----------+--------------+ PERO     Full                                                         +---------+---------------+---------+-----------+----------+--------------+   +---------+---------------+---------+-----------+----------+--------------+ LEFT     CompressibilityPhasicitySpontaneityPropertiesThrombus Aging +---------+---------------+---------+-----------+----------+--------------+ CFV      Full           Yes      Yes                                 +---------+---------------+---------+-----------+----------+--------------+ SFJ      Full                                                        +---------+---------------+---------+-----------+----------+--------------+ FV Prox  Full           Yes      Yes                                 +---------+---------------+---------+-----------+----------+--------------+ FV Mid   Full  Yes      Yes                                 +---------+---------------+---------+-----------+----------+--------------+ FV DistalFull           Yes      Yes                                 +---------+---------------+---------+-----------+----------+--------------+ PFV      Full                                                        +---------+---------------+---------+-----------+----------+--------------+ POP      Full           Yes      Yes                                 +---------+---------------+---------+-----------+----------+--------------+ PTV      Full                                                        +---------+---------------+---------+-----------+----------+--------------+ PERO     Full                                                        +---------+---------------+---------+-----------+----------+--------------+     Summary: BILATERAL: - No evidence of deep vein thrombosis seen in the lower extremities, bilaterally. - No evidence of superficial venous thrombosis in the lower extremities, bilaterally. -No evidence of popliteal cyst,  bilaterally. RIGHT: - Ultrasound characteristics of enlarged lymph nodes are noted in the groin. Diffuse subcutaneous edema noted throughout.  LEFT: - Ultrasound characteristics of enlarged lymph nodes noted in the groin. Diffuse subcutaneous edema noted throughout.  *See table(s) above for measurements and observations. Electronically signed by Gerarda Fraction on 03/17/2021 at 10:21:06 AM.    Final     Scheduled Meds:  sodium chloride   Intravenous Once   lactulose  30 g Oral BID   midodrine  5 mg Oral TID WC   pantoprazole  40 mg Oral BID   rifaximin  550 mg Oral BID   sodium chloride flush  3 mL Intravenous Q12H   Continuous Infusions:  albumin human 50 g (03/17/21 2252)   cefTRIAXone (ROCEPHIN)  IV 2 g (03/17/21 1723)   octreotide  (SANDOSTATIN)    IV infusion 50 mcg/hr (03/18/21 0023)   PRN Meds:.albuterol, ondansetron **OR** ondansetron (ZOFRAN) IV   ASSESMENT:    Cirrhosis of the liver presumed secondary to EtOH.  Now decompensated with ascites (no SBP), hepatic encephalopathy.  Meld 35.  LFTs improving.   GI bleed, melena.  EGD 01/2021 with nonbleeding, grade 2 esophageal varices not ligated, portal hypertensive gastropathy, gastric and duodenal ulcers.  Inderal PTA.  Completed 72 h Octreotide this AM.   Anemia.  Hgb  Nadir 5.8, 6.7 ... 7.4 last 24 h.   3 PRBCs thus far.  Protonix 40 mg po bid in place.      AKI.  GFR better but still only 23.  Urine na < 10.  Albumin infusions since 11/27, now BID through 11/30.  Midodrine in place.     Ascites.  4 L tap 11/27, no SBP.  Day 4 of 5 Rocephin for SBP prophylaxis.  No diuretics now or PTA.     HE.  On Xifaxan, Lactulose.  Ammonia level 153 ... 56.   Thrombocytopenia.  Platelets declining, 85 K today.         Hyponatremia at 133.   Enlarged lymph nodes of the groin per Doppler ultrasound studies of lower extremities.     PLAN     EGD w aim for variceal banding (VBL) prior to discharge, timing TBD.      Avoid overshooting  Hgb w transfusion, goal is Hgb between 7 and 8.    Adjust lactulose dose downward.  From 30 g bid to 20 g bid    Looking to renal sevice to assist w volume mgt.  ? Does he need to continue octreotide for AKI/HRS?    Azucena Freed  03/18/2021, 8:55 AM Phone [phone]

## 2021-03-18 NOTE — Progress Notes (Signed)
PROGRESS NOTE    Kyle Pitts  [id number] DOB: 08-Aug-1960 DOA: 03/15/2021 PCP: Default, Provider, MD   Brief Narrative:  Kyle Pitts is a 60 y.o. male with medical history significant of alcoholic cirrhosis with esophageal varices and septic arthritis of right knee who presented to ED after being noted to be more confused by sister on the morning of admission.  He had just recently been hospitalized 10/26-10/30 with likely alcoholic cirrhosis.  Patient had undergone EGD on 10/29 noting grade 2 esophageal varices along with gastric and duodenal ulcers. since getting home he reports to continued use of Mobic although appears to have been recommended to discontinue NSAIDs during his last hospitalization.  Last drink was reported prior to previous hospitalization last month.  He has reported dark stools.    Upon arrival to ED, he was hemodynamically stable. hemoglobin 7.9, creatinine 4.69, AST 140, ALT 51, total bilirubin 11.9, ammonia 153, and INR 1.5. CT scan of the abdomen and pelvis noted significant progression of ascites with generalized soft tissue edema consistent with anasarca with liver noted to be decreased in size with improve steatosis and mild nonspecific right colonic wall thickening.  COVID-19 and influenza screening were negative.  Patient had been given lactulose 30 g x 1 dose.  TRH called to admit.  Assessment & Plan:   Principal Problem:   Hepatic encephalopathy Active Problems:   Hyponatremia   Acute renal failure (ARF) (HCC)   Elevated LFTs   Alcoholic cirrhosis of liver with ascites (HCC)   Gastric ulcer due to nonsteroidal anti-inflammatory drug (NSAID)   Duodenal ulcer   Severe protein-calorie malnutrition (HCC)   Thrombocytopenia (HCC)   Metabolic acidosis   Hepatic encephalopathy secondary to decompensated alcoholic cirrhosis with ascites:  ammonia elevated at 156 upon admission, improved to 56 on 03/16/2021.  Just recently hospitalized for suspected  alcoholic cirrhosis.  Had extensive work-up GI for which alcoholic hepatitis was thought to be less likely by GI.  Noted to have autoimmune markers-IgG elevated to 2342, normal ceruloplasmin hep B surface antigen alpha antitrypsin, antimitochondrial antibody ANA but borderline elevated ASMA.Marland Kitchen  Underwent EGD evaluation 10/29 noted to have gastric and duodenal ulcers.  During his last hospitalization he was not noted to be a candidate for transplant given recent alcohol use. Patient's MELD score is 35 with a 52.6% estimated 52-month mortality.  Seen by GI.  Started on high-dose lactulose, has been having several bowel movements, de-escalating lactulose.  Mental status improved, fully alert and oriented.  Has been started on Rocephin and GI recommends continuing for 5 days.  Underwent diagnostic and therapeutic paracentesis with 4 L of fluid removed on 03/16/2021.  Labs so far not indicative of SBP.  Still has large ascites, and I think he will benefit from another paracentesis, discussed with GI and nephrology, nephrology recommends removing no more than 4 L of fluid.  Consult placed for IR to do so.  He received albumin for 3 days/doses, will repeat 3 more doses./doses.   Nonoliguric acute renal failure with metabolic acidosis: Patient reports no urine output in the last 1 to 2 days prior to admission.  Creatinine of 4.69 with BUN 56Upon admission, improving now down to 3.05.  CO2 also at 19, stable today..  Baseline creatinine previously had been around 1 and discharged on 10/30.  Likely hepatorenal syndrome.  Urine output now picking up.  Nephrology on board and management per them.  Hypokalemia: Resolved.   Transaminitis and hyperbilirubinemia: Chronic. Alkaline phosphatase 221,  AST  140, ALT 51, and total bilirubin 11.9.  Total bilirubin appears to be improved from previous 22.8. -Continue to monitor   Macrocytic anemia secondary to suspect GI bleed,Acute on chronic/gastric and duodenal ulcers: Patient  reports having dark stools at home prior to coming in and admitted to still using Mobic.  Last EGD from 10/29 noted gastric and duodenal ulcers.  Hemoglobin on admission noted to be 7.9 with elevated MCV and MCH.  Previously had been 8.8 on discharge on 10/30.  Dropped to 5.8 on afternoon of 03/16/2021, has received 3 unit of PRBC transfusion so far, hemoglobin 7.4.  Goal to keep it between 7 and 8 per GI.  He remains on octreotide drip and twice daily PPI.  Defer to GI for timing of EGD.    Acute thrombocytopenia: Drifting down slowly, 85 today.  Not on any heparin products.  Monitor closely.   Hyponatremia: Acute on chronic.  Suspected secondary to cirrhosis.  Currently 133.  Nephrology managing.   Severe protein calorie malnutrition: On admission albumin noted to be low at 1.7.  Nutrition consulted.   DVT prophylaxis: SCDs Start: 03/15/21 1420   Code Status: Full Code  Family Communication:  None present at bedside.  Plan of care discussed with patient in length and he verbalized understanding and agreed with it.  I then discussed plan of care with the sister over the phone.  She requested GI provider to call her, I have notified Dr. Salena Saner.  Status is: Inpatient  Remains inpatient appropriate because: Needs further management of possible GI bleed and acute renal failure.   Estimated body mass index is 34.03 kg/m as calculated from the following:   Height as of this encounter: 6' (1.829 m).   Weight as of this encounter: 113.8 kg.  Nutritional Assessment: Body mass index is 34.03 kg/m.Marland Kitchen Seen by dietician.  I agree with the assessment and plan as outlined below: Nutrition Status: Nutrition Problem: Increased nutrient needs Etiology: chronic illness (cirrhosis) Skin Assessment: I have examined the patient's skin and I agree with the wound assessment as performed by the wound care RN as outlined below:    Consultants:  Nephrology GI  Procedures:  None  Antimicrobials:   Anti-infectives (From admission, onward)    Start     Dose/Rate Route Frequency Ordered Stop   03/17/21 1730  cefTRIAXone (ROCEPHIN) 2 g in sodium chloride 0.9 % 100 mL IVPB        2 g 200 mL/hr over 30 Minutes Intravenous Every 24 hours 03/17/21 0721 03/22/21 1729   03/16/21 2200  rifaximin (XIFAXAN) tablet 550 mg        550 mg Oral 2 times daily 03/16/21 2048     03/15/21 1730  cefTRIAXone (ROCEPHIN) 1 g in sodium chloride 0.9 % 100 mL IVPB  Status:  Discontinued        1 g 200 mL/hr over 30 Minutes Intravenous Every 24 hours 03/15/21 1709 03/17/21 0721   03/15/21 1645  ciprofloxacin (CIPRO) IVPB 200 mg  Status:  Discontinued        200 mg 100 mL/hr over 60 Minutes Intravenous Every 24 hours 03/15/21 1644 03/15/21 1709          Subjective:  Patient seen and examined.  He has no complaints and he feels better.  Objective: Vitals:   03/17/21 1832 03/17/21 1900 03/17/21 2206 03/18/21 0447  BP: 110/74 118/79 111/71 127/71  Pulse: (!) 59  65 64  Resp: 19 18 (!) 24 15  Temp: 98.1  F (36.7 C) (!) 97.3 F (36.3 C) 98.4 F (36.9 C) 98.9 F (37.2 C)  TempSrc: Oral Oral Oral Oral  SpO2: 100% 94% 93% 91%  Weight:    113.8 kg  Height:        Intake/Output Summary (Last 24 hours) at 03/18/2021 1046 Last data filed at 03/18/2021 0854 Gross per 24 hour  Intake 3292.15 ml  Output 1545 ml  Net 1747.15 ml    Filed Weights   03/16/21 0509 03/17/21 0502 03/18/21 0447  Weight: 111.9 kg 108.6 kg 113.8 kg    Examination:  General exam: Appears calm and comfortable, obese, visually icteric Respiratory system: Clear to auscultation. Respiratory effort normal. Cardiovascular system: S1 & S2 heard, RRR. No JVD, murmurs, rubs, gallops or clicks.  +1 pitting edema bilateral lower extremity. Gastrointestinal system: Abdomen is distended, soft and nontender with positive fluid shift, no organomegaly or masses felt. Normal bowel sounds heard. Central nervous system: Alert and oriented.  No focal neurological deficits. Extremities: Symmetric 5 x 5 power. Skin: No rashes, lesions or ulcers.  Psychiatry: Judgement and insight appear normal. Mood & affect appropriate.    Data Reviewed: I have personally reviewed following labs and imaging studies  CBC: Recent Labs  Lab 03/15/21 0958 03/15/21 1830 03/16/21 0323 03/16/21 1632 03/17/21 0638 03/17/21 1630 03/18/21 0015  WBC 10.0   < > 8.5 7.3 7.6 6.5 8.1  NEUTROABS 7.1  --   --   --   --   --   --   HGB 7.9*   < > 7.2* 5.8* 6.9* 6.7* 7.4*  HCT 23.4*   < > 20.3* 17.2* 19.5* 19.8* 22.0*  MCV 106.4*   < > 104.1* 105.5* 97.5 100.0 97.8  PLT 129*   < > 124* 111* 91* 91* 85*   < > = values in this interval not displayed.    Basic Metabolic Panel: Recent Labs  Lab 03/15/21 0958 03/16/21 0323 03/16/21 1037 03/17/21 0638 03/18/21 0015  NA 131* 133*  --  133*  135 133*  K 3.8 3.6  --  2.9*  2.9* 3.6  CL 103 104  --  104  106 107  CO2 21* 19*  --  19*  21* 19*  GLUCOSE 103* 106*  --  88  90 109*  BUN 56* 60*  --  55*  54* 47*  CREATININE 4.69* 4.53*  --  3.44*  3.55* 3.05*  CALCIUM 8.4* 8.6*  --  8.3*  8.5* 8.4*  MG  --   --  2.2  --   --   PHOS  --   --   --  5.1*  --     GFR: Estimated Creatinine Clearance: 34.4 mL/min (A) (by C-G formula based on SCr of 3.05 mg/dL (H)). Liver Function Tests: Recent Labs  Lab 03/15/21 0958 03/16/21 0323 03/17/21 0638 03/18/21 0015  AST 140* 134* 87* 90*  ALT 51* 42 29 28  ALKPHOS 221* 180* 112 112  BILITOT 11.9* 11.7* 10.1* 10.5*  PROT 7.5 7.5 6.3* 6.7  ALBUMIN 1.7* 2.4* 2.7*  2.7* 3.0*    No results for input(s): LIPASE, AMYLASE in the last 168 hours. Recent Labs  Lab 03/15/21 0958 03/16/21 1037  AMMONIA 153* 56*    Coagulation Profile: Recent Labs  Lab 03/15/21 1239  INR 1.5*    Cardiac Enzymes: No results for input(s): CKTOTAL, CKMB, CKMBINDEX, TROPONINI in the last 168 hours. BNP (last 3 results) No results for input(s): PROBNP in the  last 8760 hours. HbA1C: No results for input(s): HGBA1C in the last 72 hours. CBG: Recent Labs  Lab 03/15/21 1013  GLUCAP 82    Lipid Profile: No results for input(s): CHOL, HDL, LDLCALC, TRIG, CHOLHDL, LDLDIRECT in the last 72 hours. Thyroid Function Tests: No results for input(s): TSH, T4TOTAL, FREET4, T3FREE, THYROIDAB in the last 72 hours. Anemia Panel: No results for input(s): VITAMINB12, FOLATE, FERRITIN, TIBC, IRON, RETICCTPCT in the last 72 hours. Sepsis Labs: No results for input(s): PROCALCITON, LATICACIDVEN in the last 168 hours.  Recent Results (from the past 240 hour(s))  Resp Panel by RT-PCR (Flu A&B, Covid) Nasopharyngeal Swab     Status: None   Collection Time: 03/15/21 10:14 AM   Specimen: Nasopharyngeal Swab; Nasopharyngeal(NP) swabs in vial transport medium  Result Value Ref Range Status   SARS Coronavirus 2 by RT PCR NEGATIVE NEGATIVE Final    Comment: (NOTE) SARS-CoV-2 target nucleic acids are NOT DETECTED.  The SARS-CoV-2 RNA is generally detectable in upper respiratory specimens during the acute phase of infection. The lowest concentration of SARS-CoV-2 viral copies this assay can detect is 138 copies/mL. A negative result does not preclude SARS-Cov-2 infection and should not be used as the sole basis for treatment or other patient management decisions. A negative result may occur with  improper specimen collection/handling, submission of specimen other than nasopharyngeal swab, presence of viral mutation(s) within the areas targeted by this assay, and inadequate number of viral copies(<138 copies/mL). A negative result must be combined with clinical observations, patient history, and epidemiological information. The expected result is Negative.  Fact Sheet for Patients:  EntrepreneurPulse.com.au  Fact Sheet for Healthcare Providers:  IncredibleEmployment.be  This test is no t yet approved or cleared by the  [place] FDA and  has been authorized for detection and/or diagnosis of SARS-CoV-2 by FDA under an Emergency Use Authorization (EUA). This EUA will remain  in effect (meaning this test can be used) for the duration of the COVID-19 declaration under Section 564(b)(1) of the Act, 21 U.S.C.section 360bbb-3(b)(1), unless the authorization is terminated  or revoked sooner.       Influenza A by PCR NEGATIVE NEGATIVE Final   Influenza B by PCR NEGATIVE NEGATIVE Final    Comment: (NOTE) The Xpert Xpress SARS-CoV-2/FLU/RSV plus assay is intended as an aid in the diagnosis of influenza from Nasopharyngeal swab specimens and should not be used as a sole basis for treatment. Nasal washings and aspirates are unacceptable for Xpert Xpress SARS-CoV-2/FLU/RSV testing.  Fact Sheet for Patients: EntrepreneurPulse.com.au  Fact Sheet for Healthcare Providers: IncredibleEmployment.be  This test is not yet approved or cleared by the [place] FDA and has been authorized for detection and/or diagnosis of SARS-CoV-2 by FDA under an Emergency Use Authorization (EUA). This EUA will remain in effect (meaning this test can be used) for the duration of the COVID-19 declaration under Section 564(b)(1) of the Act, 21 U.S.C. section 360bbb-3(b)(1), unless the authorization is terminated or revoked.  Performed at [facility], [place] [address], [place], [place] [address]   Gram stain     Status: None   Collection Time: 03/16/21 12:20 PM   Specimen: Fluid  Result Value Ref Range Status   Specimen Description FLUID  Final   Special Requests  PERITONEAL  Final   Gram Stain   Final    NO WBC SEEN NO ORGANISMS SEEN Performed at [facility], [place] [address] [place], [place] [address]    Report Status 03/17/2021  FINAL  Final  Culture, body fluid w Gram Stain-bottle     Status: None (Preliminary result)   Collection Time: 03/16/21 12:20 PM   Specimen:  Fluid  Result Value Ref Range Status   Specimen Description FLUID  Final   Special Requests  PERITONEAL  Final   Culture   Final    NO GROWTH 2 DAYS Performed at [facility], [address] [place], [place] [address]    Report Status PENDING  Incomplete       Radiology Studies: US Paracentesis  Result Date: 03/16/2021 INDICATION: Abdominal distention. Ascites due to alcoholic cirrhosis. Request for diagnostic and therapeutic paracentesis up to 4 L max. EXAM: ULTRASOUND GUIDED RIGHT LOWER QUADRANT PARACENTESIS MEDICATIONS: 1% plain lidocaine, 5 mL COMPLICATIONS: None immediate. PROCEDURE: Informed written consent was obtained from the patient after a discussion of the risks, benefits and alternatives to treatment. A timeout was performed prior to the initiation of the procedure. Initial ultrasound scanning demonstrates a large amount of ascites within the right lower abdominal quadrant. The right lower abdomen was prepped and draped in the usual sterile fashion. 1% lidocaine was used for local anesthesia. Following this, a 19 gauge, 7-cm, Yueh catheter was introduced. An ultrasound image was saved for documentation purposes. The paracentesis was performed. The catheter was removed and a dressing was applied. The patient tolerated the procedure well without immediate post procedural complication. FINDINGS: A total of approximately 4 L of clear yellow fluid was removed. Samples were sent to the laboratory as requested by the clinical team. IMPRESSION: Successful ultrasound-guided paracentesis yielding 4 liters of peritoneal fluid. Read by: Ascencion Dike PA-C Electronically Signed   By: Sandi Mariscal M.D.   On: 03/16/2021 13:21    Scheduled Meds:  sodium chloride   Intravenous Once   feeding supplement  237 mL Oral BID BM   lactulose  20 g Oral BID   midodrine  5 mg Oral TID WC   multivitamin with minerals  1 tablet Oral Daily   pantoprazole  40 mg Oral BID   rifaximin  550 mg Oral BID    sodium chloride flush  3 mL Intravenous Q12H   Continuous Infusions:  cefTRIAXone (ROCEPHIN)  IV 2 g (03/17/21 1723)   octreotide  (SANDOSTATIN)    IV infusion 50 mcg/hr (03/18/21 0023)     LOS: 3 days   Time spent: 33 minutes   Darliss Cheney, MD [facility]  03/18/2021, 10:46 AM  Please page via [facility] and do not message via secure chat for anything urgent. Secure chat can be used for anything non urgent.  How to contact the [facility] Attending or Consulting provider [place] or covering provider during after hours [place], for this patient?  Check the care team in [facility] and look for a) attending/consulting [facility] provider listed and b) the [facility] team listed. Page or secure chat [phone]. Log into [email or web address] and use [place]'s universal password to access. If you do not have the password, please contact the hospital operator. Locate the [facility] provider you are looking for under [facility] and page to a number that you can be directly reached. If you still have difficulty reaching the provider, please page the [facility] (Director on Call) for the Hospitalists listed on amion for assistance.

## 2021-03-18 NOTE — Progress Notes (Signed)
[name]  Assessment/ Plan:  # Acute kidney injury, nonoliguric: Likely type I hepatorenal syndrome.  UA bland, urine sodium <10 and kidneys unremarkable and CT scan.  The serum creatinine level is trending down with albumin, midodrine and octreotide.  I will continue current management and strict ins and outs and daily lab.  No need for dialysis.  May discontinue octreotide and albumin tomorrow if continue to have renal recovery.  # Hyponatremia: Secondary to cirrhosis and inappropriate ADH activation from decreased effective arterial blood volume.  Continue to limit oral fluid intake and monitor with efforts at volume expansion using albumin.  Sodium level is stable.  #Acute hepatic encephalopathy: Improved mental status, treated with lactulose.  #Acute decompensated liver cirrhosis: Seen by GI.  Diagnostic paracentesis was negative for SBP.  Added Xifaxan.  #  Anemia: Likely secondary to cirrhosis with associated GI bleed from recent gastritis and duodenitis in the setting of ongoing meloxicam use.  PPI, blood transfusion per primary team.  Plan for EGD with variceal banding prior to discharge per GI.  #Thrombocytopenia: Secondary to portal hypertension and splenic sequestration.   Subjective: Seen and examined at bedside.  Urine output around 1.5 L.  Denies nausea, vomiting, chest pain or shortness of breath.  No new event. Objective Vital signs in last 24 hours: Vitals:   03/17/21 1832 03/17/21 1900 03/17/21 2206 03/18/21 0447  BP: 110/74 118/79 111/71 127/71  Pulse: (!) 59  65 64  Resp: 19 18 (!) 24 15  Temp: 98.1 F (36.7 C) (!) 97.3 F (36.3 C) 98.4 F (36.9 C) 98.9 F (37.2 C)  TempSrc: Oral Oral Oral Oral  SpO2: 100% 94% 93% 91%  Weight:    113.8 kg  Height:       Weight change: 5.164 kg  Intake/Output Summary (Last 24 hours) at 03/18/2021 0937 Last data filed at 03/18/2021 0854 Gross per 24 hour  Intake 3292.15 ml  Output  1720 ml  Net 1572.15 ml        Labs: Basic Metabolic Panel: Recent Labs  Lab 03/16/21 0323 03/17/21 0638 03/18/21 0015  NA 133* 133*  135 133*  K 3.6 2.9*  2.9* 3.6  CL 104 104  106 107  CO2 19* 19*  21* 19*  GLUCOSE 106* 88  90 109*  BUN 60* 55*  54* 47*  CREATININE 4.53* 3.44*  3.55* 3.05*  CALCIUM 8.6* 8.3*  8.5* 8.4*  PHOS  --  5.1*  --     Liver Function Tests: Recent Labs  Lab 03/16/21 0323 03/17/21 0638 03/18/21 0015  AST 134* 87* 90*  ALT 42 29 28  ALKPHOS 180* 112 112  BILITOT 11.7* 10.1* 10.5*  PROT 7.5 6.3* 6.7  ALBUMIN 2.4* 2.7*  2.7* 3.0*    No results for input(s): LIPASE, AMYLASE in the last 168 hours. Recent Labs  Lab 03/15/21 0958 03/16/21 1037  AMMONIA 153* 56*    CBC: Recent Labs  Lab 03/15/21 0958 03/15/21 1830 03/16/21 0323 03/16/21 1632 03/17/21 0638 03/17/21 1630 03/18/21 0015  WBC 10.0   < > 8.5 7.3 7.6 6.5 8.1  NEUTROABS 7.1  --   --   --   --   --   --   HGB 7.9*   < > 7.2* 5.8* 6.9* 6.7* 7.4*  HCT 23.4*   < > 20.3* 17.2* 19.5* 19.8* 22.0*  MCV 106.4*   < > 104.1* 105.5* 97.5 100.0 97.8  PLT 129*   < >  124* 111* 91* 91* 85*   < > = values in this interval not displayed.    Cardiac Enzymes: No results for input(s): CKTOTAL, CKMB, CKMBINDEX, TROPONINI in the last 168 hours. CBG: Recent Labs  Lab 03/15/21 1013  GLUCAP 82     Iron Studies: No results for input(s): IRON, TIBC, TRANSFERRIN, FERRITIN in the last 72 hours. Studies/Results: US Paracentesis  Result Date: 03/16/2021 INDICATION: Abdominal distention. Ascites due to alcoholic cirrhosis. Request for diagnostic and therapeutic paracentesis up to 4 L max. EXAM: ULTRASOUND GUIDED RIGHT LOWER QUADRANT PARACENTESIS MEDICATIONS: 1% plain lidocaine, 5 mL COMPLICATIONS: None immediate. PROCEDURE: Informed written consent was obtained from the patient after a discussion of the risks, benefits and alternatives to treatment. A timeout was performed prior to  the initiation of the procedure. Initial ultrasound scanning demonstrates a large amount of ascites within the right lower abdominal quadrant. The right lower abdomen was prepped and draped in the usual sterile fashion. 1% lidocaine was used for local anesthesia. Following this, a 19 gauge, 7-cm, Yueh catheter was introduced. An ultrasound image was saved for documentation purposes. The paracentesis was performed. The catheter was removed and a dressing was applied. The patient tolerated the procedure well without immediate post procedural complication. FINDINGS: A total of approximately 4 L of clear yellow fluid was removed. Samples were sent to the laboratory as requested by the clinical team. IMPRESSION: Successful ultrasound-guided paracentesis yielding 4 liters of peritoneal fluid. Read by: Brayton El PA-C Electronically Signed   By: Simonne Come M.D.   On: 03/16/2021 13:21   VAS [place] LOWER EXTREMITY VENOUS (DVT) (7a-7p)  Result Date: 03/17/2021  Lower Venous DVT Study Patient Name:  Kyle Pitts  Date of Exam:   03/16/2021 Medical Rec #: [id number]        Accession #:    [id number] Date of Birth: [birth year]        Patient Gender: M Patient Age:   60 years Exam Location:  [facility] Procedure:      VAS [place] LOWER EXTREMITY VENOUS (DVT) Referring Phys: SOPHIA CACCAVALE --------------------------------------------------------------------------------  Indications: Edema (bilateral pitting). Other Indications: Cirrhosis, anasarca. Comparison Study: Previous exam 10/07/2020 was negative for DVT in LLE Performing Technologist: Ernestene Mention RVT, RDMS  Examination Guidelines: A complete evaluation includes B-mode imaging, spectral Doppler, color Doppler, and power Doppler as needed of all accessible portions of each vessel. Bilateral testing is considered an integral part of a complete examination. Limited examinations for reoccurring indications may be performed as noted. The reflux portion of the exam is  performed with the patient in reverse Trendelenburg.  +---------+---------------+---------+-----------+----------+--------------+ RIGHT    CompressibilityPhasicitySpontaneityPropertiesThrombus Aging +---------+---------------+---------+-----------+----------+--------------+ CFV      Full           Yes      Yes                                 +---------+---------------+---------+-----------+----------+--------------+ SFJ      Full                                                        +---------+---------------+---------+-----------+----------+--------------+ FV Prox  Full           Yes      Yes                                 +---------+---------------+---------+-----------+----------+--------------+  FV Mid   Full           Yes      Yes                                 +---------+---------------+---------+-----------+----------+--------------+ FV DistalFull           Yes      Yes                                 +---------+---------------+---------+-----------+----------+--------------+ PFV      Full                                                        +---------+---------------+---------+-----------+----------+--------------+ POP      Full           Yes      Yes                                 +---------+---------------+---------+-----------+----------+--------------+ PTV      Full                                                        +---------+---------------+---------+-----------+----------+--------------+ PERO     Full                                                        +---------+---------------+---------+-----------+----------+--------------+   +---------+---------------+---------+-----------+----------+--------------+ LEFT     CompressibilityPhasicitySpontaneityPropertiesThrombus Aging +---------+---------------+---------+-----------+----------+--------------+ CFV      Full           Yes      Yes                                  +---------+---------------+---------+-----------+----------+--------------+ SFJ      Full                                                        +---------+---------------+---------+-----------+----------+--------------+ FV Prox  Full           Yes      Yes                                 +---------+---------------+---------+-----------+----------+--------------+ FV Mid   Full           Yes      Yes                                 +---------+---------------+---------+-----------+----------+--------------+ FV DistalFull  Yes      Yes                                 +---------+---------------+---------+-----------+----------+--------------+ PFV      Full                                                        +---------+---------------+---------+-----------+----------+--------------+ POP      Full           Yes      Yes                                 +---------+---------------+---------+-----------+----------+--------------+ PTV      Full                                                        +---------+---------------+---------+-----------+----------+--------------+ PERO     Full                                                        +---------+---------------+---------+-----------+----------+--------------+     Summary: BILATERAL: - No evidence of deep vein thrombosis seen in the lower extremities, bilaterally. - No evidence of superficial venous thrombosis in the lower extremities, bilaterally. -No evidence of popliteal cyst, bilaterally. RIGHT: - Ultrasound characteristics of enlarged lymph nodes are noted in the groin. Diffuse subcutaneous edema noted throughout.  LEFT: - Ultrasound characteristics of enlarged lymph nodes noted in the groin. Diffuse subcutaneous edema noted throughout.  *See table(s) above for measurements and observations. Electronically signed by Gerarda Fraction on 03/17/2021 at 10:21:06 AM.    Final      Medications: Infusions:  albumin human 50 g (03/17/21 2252)   cefTRIAXone (ROCEPHIN)  IV 2 g (03/17/21 1723)   octreotide  (SANDOSTATIN)    IV infusion 50 mcg/hr (03/18/21 0023)    Scheduled Medications:  sodium chloride   Intravenous Once   feeding supplement  237 mL Oral BID BM   lactulose  20 g Oral BID   midodrine  5 mg Oral TID WC   multivitamin with minerals  1 tablet Oral Daily   pantoprazole  40 mg Oral BID   rifaximin  550 mg Oral BID   sodium chloride flush  3 mL Intravenous Q12H    have reviewed scheduled and prn medications.  Physical Exam: General: Not in distress, comfortable Heart:RRR, s1s2 nl Lungs:clear b/l, no crackle Abdomen:soft, Non-tender, non-distended Extremities:b/l leg edema +, stable. Neurology: Alert, awake and following commands  Helene Bernstein Jaynie Collins 03/18/2021,9:37 AM  LOS: 3 days

## 2021-03-18 NOTE — Progress Notes (Addendum)
Attending physician's note   I have taken an interval history, reviewed the chart and examined the patient. I agree with the Advanced Practitioner's note, impression, and recommendations as outlined.   1) Decompensated EtOH cirrhosis 2) Esophageal varices 3) Portal hypertensive gastropathy 4) Anemia 5) Hepatic encephalopathy (new diagnosis) 6) Thrombocytopenia 7) Hyponatremia 8) Hypokalemia 9) Coagulopathy 10) History of gastric and duodenal ulcers on EGD 01/2021 11) Ascites (negative for SBP) 12) AKI/Hepatorenal syndrome  - Plan for EGD tomorrow for diagnostic and potentially therapeutic intent - Did have esophageal varices on index EGD last month.  Was started on propranolol.  Depending on endoscopic findings, may either resume nonselective beta-blocker vs variceal ligation - Renal function continues to slowly improve with albumin, midodrine, octreotide.  Per Nephrology, may discontinue octreotide and albumin tomorrow if continued recovery - Continue Rocephin x5 days total then discontinue - Plan for therapeutic paracentesis.  Would limit fluid removal to <4L given HRS - Continue rifaximin/lactulose.  Mentation continues to improve - Was transfused 1 unit PRBCs again yesterday with H/H 7.4/22 today - I discussed his case with his sister by phone.   I discussed the risk/benefits of EGD with the patient today along with his sister by phone and both agreed to proceed with procedure as scheduled.  [place], DO, FACG [phone] office                Daily Rounding Note  03/18/2021, 8:55 AM  LOS: 3 days   SUBJECTIVE:   Chief complaint:  decompensated cirrhosis.      Eating 100% of meals.   Urine output 1.57 L yesterday.  At least 4 BM's yesterday.  3 bowel movements so far today, 1 in the wee hours, 2 this morning.  Rectum feels raw.  Total I/O of 2.4 L yesterday.  No activity OOB for at least 2 d.  Gets  SOB w speech due to abdominal distention which is recurrent since tap.     OBJECTIVE:         Vital signs in last 24 hours:    Temp:  [97.1 F (36.2 C)-98.9 F (37.2 C)] 98.9 F (37.2 C) (11/29 0447) Pulse Rate:  [57-65] 64 (11/29 0447) Resp:  [14-24] 15 (11/29 0447) BP: (110-127)/(71-79) 127/71 (11/29 0447) SpO2:  [91 %-100 %] 91 % (11/29 0447) Weight:  [113.8 kg] 113.8 kg (11/29 0447) Last BM Date: 03/18/21 Filed Weights   03/16/21 0509 03/17/21 0502 03/18/21 0447  Weight: 111.9 kg 108.6 kg 113.8 kg   General: looks ill, jaundiced.  comfortable   Heart: RRR Chest: clear bil.  Dyspnea w speaking Abdomen: Protuberant/distended, tense.  Nontender.  Bowel sounds active. Extremities: Pitting edema on the left foot. Neuro/Psych: Alert.  Appropriate.  Oriented x3.  No asterixis.  Intake/Output from previous day: 11/28 0701 - 11/29 0700 In: 4009.2 [P.O.:2577; I.V.:492.6; Blood:439; IV Piggyback:500.5] Out: 1570 [Urine:1570]  Intake/Output this shift: Total I/O In: 240 [P.O.:240] Out: 150 [Urine:150]  Lab Results: Recent Labs    03/17/21 0638 03/17/21 1630 03/18/21 0015  WBC 7.6 6.5 8.1  HGB 6.9* 6.7* 7.4*  HCT 19.5* 19.8* 22.0*  PLT 91* 91* 85*   BMET Recent Labs    03/16/21 0323 03/17/21 0638 03/18/21 0015  NA 133* 133*  135 133*  K 3.6 2.9*  2.9* 3.6  CL 104 104  106 107  CO2 19* 19*  21* 19*  GLUCOSE 106* 88  90 109*  BUN 60* 55*  54* 47*  CREATININE 4.53*  3.44*  3.55* 3.05*  CALCIUM 8.6* 8.3*  8.5* 8.4*   LFT Recent Labs    03/16/21 0323 03/17/21 0638 03/18/21 0015  PROT 7.5 6.3* 6.7  ALBUMIN 2.4* 2.7*  2.7* 3.0*  AST 134* 87* 90*  ALT 42 29 28  ALKPHOS 180* 112 112  BILITOT 11.7* 10.1* 10.5*   PT/INR Recent Labs    03/15/21 1239  LABPROT 18.4*  INR 1.5*   Hepatitis Panel No results for input(s): HEPBSAG, HCVAB, HEPAIGM, HEPBIGM in the last 72 hours.  Studies/Results: US Paracentesis  Result Date: 03/16/2021 INDICATION:  Abdominal distention. Ascites due to alcoholic cirrhosis. Request for diagnostic and therapeutic paracentesis up to 4 L max. EXAM: ULTRASOUND GUIDED RIGHT LOWER QUADRANT PARACENTESIS MEDICATIONS: 1% plain lidocaine, 5 mL COMPLICATIONS: None immediate. PROCEDURE: Informed written consent was obtained from the patient after a discussion of the risks, benefits and alternatives to treatment. A timeout was performed prior to the initiation of the procedure. Initial ultrasound scanning demonstrates a large amount of ascites within the right lower abdominal quadrant. The right lower abdomen was prepped and draped in the usual sterile fashion. 1% lidocaine was used for local anesthesia. Following this, a 19 gauge, 7-cm, Yueh catheter was introduced. An ultrasound image was saved for documentation purposes. The paracentesis was performed. The catheter was removed and a dressing was applied. The patient tolerated the procedure well without immediate post procedural complication. FINDINGS: A total of approximately 4 L of clear yellow fluid was removed. Samples were sent to the laboratory as requested by the clinical team. IMPRESSION: Successful ultrasound-guided paracentesis yielding 4 liters of peritoneal fluid. Read by: Ascencion Dike PA-C Electronically Signed   By: Sandi Mariscal M.D.   On: 03/16/2021 13:21   VAS [place] LOWER EXTREMITY VENOUS (DVT) (7a-7p)  Result Date: 03/17/2021  Lower Venous DVT Study Patient Name:  Kyle Pitts  Date of Exam:   03/16/2021 Medical Rec #: [id number]        Accession #:    [id number] Date of Birth: 12-18-1960        Patient Gender: M Patient Age:   60 years Exam Location:  [facility] Procedure:      VAS [place] LOWER EXTREMITY VENOUS (DVT) Referring Phys: SOPHIA CACCAVALE --------------------------------------------------------------------------------  Indications: Edema (bilateral pitting). Other Indications: Cirrhosis, anasarca. Comparison Study: Previous exam 10/07/2020 was negative  for DVT in LLE Performing Technologist: Rogelia Rohrer RVT, RDMS  Examination Guidelines: A complete evaluation includes B-mode imaging, spectral Doppler, color Doppler, and power Doppler as needed of all accessible portions of each vessel. Bilateral testing is considered an integral part of a complete examination. Limited examinations for reoccurring indications may be performed as noted. The reflux portion of the exam is performed with the patient in reverse Trendelenburg.  +---------+---------------+---------+-----------+----------+--------------+ RIGHT    CompressibilityPhasicitySpontaneityPropertiesThrombus Aging +---------+---------------+---------+-----------+----------+--------------+ CFV      Full           Yes      Yes                                 +---------+---------------+---------+-----------+----------+--------------+ SFJ      Full                                                        +---------+---------------+---------+-----------+----------+--------------+  FV Prox  Full           Yes      Yes                                 +---------+---------------+---------+-----------+----------+--------------+ FV Mid   Full           Yes      Yes                                 +---------+---------------+---------+-----------+----------+--------------+ FV DistalFull           Yes      Yes                                 +---------+---------------+---------+-----------+----------+--------------+ PFV      Full                                                        +---------+---------------+---------+-----------+----------+--------------+ POP      Full           Yes      Yes                                 +---------+---------------+---------+-----------+----------+--------------+ PTV      Full                                                        +---------+---------------+---------+-----------+----------+--------------+ PERO     Full                                                         +---------+---------------+---------+-----------+----------+--------------+   +---------+---------------+---------+-----------+----------+--------------+ LEFT     CompressibilityPhasicitySpontaneityPropertiesThrombus Aging +---------+---------------+---------+-----------+----------+--------------+ CFV      Full           Yes      Yes                                 +---------+---------------+---------+-----------+----------+--------------+ SFJ      Full                                                        +---------+---------------+---------+-----------+----------+--------------+ FV Prox  Full           Yes      Yes                                 +---------+---------------+---------+-----------+----------+--------------+ FV Mid   Full  Yes      Yes                                 +---------+---------------+---------+-----------+----------+--------------+ FV DistalFull           Yes      Yes                                 +---------+---------------+---------+-----------+----------+--------------+ PFV      Full                                                        +---------+---------------+---------+-----------+----------+--------------+ POP      Full           Yes      Yes                                 +---------+---------------+---------+-----------+----------+--------------+ PTV      Full                                                        +---------+---------------+---------+-----------+----------+--------------+ PERO     Full                                                        +---------+---------------+---------+-----------+----------+--------------+     Summary: BILATERAL: - No evidence of deep vein thrombosis seen in the lower extremities, bilaterally. - No evidence of superficial venous thrombosis in the lower extremities, bilaterally. -No evidence of popliteal cyst,  bilaterally. RIGHT: - Ultrasound characteristics of enlarged lymph nodes are noted in the groin. Diffuse subcutaneous edema noted throughout.  LEFT: - Ultrasound characteristics of enlarged lymph nodes noted in the groin. Diffuse subcutaneous edema noted throughout.  *See table(s) above for measurements and observations. Electronically signed by Gerarda Fraction on 03/17/2021 at 10:21:06 AM.    Final     Scheduled Meds:  sodium chloride   Intravenous Once   lactulose  30 g Oral BID   midodrine  5 mg Oral TID WC   pantoprazole  40 mg Oral BID   rifaximin  550 mg Oral BID   sodium chloride flush  3 mL Intravenous Q12H   Continuous Infusions:  albumin human 50 g (03/17/21 2252)   cefTRIAXone (ROCEPHIN)  IV 2 g (03/17/21 1723)   octreotide  (SANDOSTATIN)    IV infusion 50 mcg/hr (03/18/21 0023)   PRN Meds:.albuterol, ondansetron **OR** ondansetron (ZOFRAN) IV   ASSESMENT:    Cirrhosis of the liver presumed secondary to EtOH.  Now decompensated with ascites (no SBP), hepatic encephalopathy.  Meld 35.  LFTs improving.   GI bleed, melena.  EGD 01/2021 with nonbleeding, grade 2 esophageal varices not ligated, portal hypertensive gastropathy, gastric and duodenal ulcers.  Inderal PTA.  Completed 72 h Octreotide this AM.   Anemia.  Hgb  Nadir 5.8, 6.7 ... 7.4 last 24 h.   3 PRBCs thus far.  Protonix 40 mg po bid in place.      AKI.  GFR better but still only 23.  Urine na < 10.  Albumin infusions since 11/27, now BID through 11/30.  Midodrine in place.     Ascites.  4 L tap 11/27, no SBP.  Day 4 of 5 Rocephin for SBP prophylaxis.  No diuretics now or PTA.     HE.  On Xifaxan, Lactulose.  Ammonia level 153 ... 56.   Thrombocytopenia.  Platelets declining, 85 K today.         Hyponatremia at 133.   Enlarged lymph nodes of the groin per Doppler ultrasound studies of lower extremities.     PLAN     EGD w aim for variceal banding (VBL) prior to discharge, timing TBD.      Avoid overshooting  Hgb w transfusion, goal is Hgb between 7 and 8.    Adjust lactulose dose downward.  From 30 g bid to 20 g bid    Looking to renal sevice to assist w volume mgt.  ? Does he need to continue octreotide for AKI/HRS?    Azucena Freed  03/18/2021, 8:55 AM Phone [phone]

## 2021-03-18 NOTE — Plan of Care (Signed)
  Problem: Clinical Measurements: Goal: Diagnostic test results will improve Outcome: Progressing Goal: Respiratory complications will improve Outcome: Progressing   Problem: Nutrition: Goal: Adequate nutrition will be maintained Outcome: Progressing   Problem: Elimination: Goal: Will not experience complications related to bowel motility Outcome: Progressing   

## 2021-03-18 NOTE — TOC Progression Note (Signed)
Transition of Care [facility]) - Progression Note    Patient Details  Name: Kyle Pitts MRN: [id number] Date of Birth: January 26, 1961  [facility] CM/SW Contact  Leone Haven, RN Phone Number: 03/18/2021, 4:19 PM  Clinical Narrative:    from home with sister,GIB , ascites, for paracentesis today,  receiving lactulose,  albumin bid, AKI resolving no need for dialysis. TOC will continue to follow for dc needs.      Barriers to Discharge: Continued Medical Work up, Inadequate or no insurance  Expected Discharge Plan and Services     Discharge Planning Services: CM Consult   Living arrangements for the past 2 months: Single Family Home                                       Social Determinants of Health (SDOH) Interventions    Readmission Risk Interventions No flowsheet data found.

## 2021-03-19 ENCOUNTER — Inpatient Hospital Stay (HOSPITAL_COMMUNITY): Payer: Medicaid Other | Admitting: Anesthesiology

## 2021-03-19 ENCOUNTER — Encounter (HOSPITAL_COMMUNITY)
Admission: EM | Disposition: A | Discharge status: Home / Self Care | Payer: Self-pay | Source: Home / Self Care | Attending: Family Medicine

## 2021-03-19 DIAGNOSIS — I8510 Secondary esophageal varices without bleeding: Secondary | ICD-10-CM

## 2021-03-19 DIAGNOSIS — K766 Portal hypertension: Secondary | ICD-10-CM

## 2021-03-19 DIAGNOSIS — K254 Chronic or unspecified gastric ulcer with hemorrhage: Secondary | ICD-10-CM

## 2021-03-19 HISTORY — PX: HEMOSTASIS CLIP PLACEMENT: SHX6857

## 2021-03-19 HISTORY — PX: ESOPHAGOGASTRODUODENOSCOPY (EGD) WITH PROPOFOL: SHX5813

## 2021-03-19 LAB — BPAM RBC
Blood Product Expiration Date: 202212142359
Blood Product Expiration Date: 202212162359
Blood Product Expiration Date: 202212162359
Blood Product Expiration Date: 202212172359
ISSUE DATE / TIME: 202211271813
ISSUE DATE / TIME: 202211280112
ISSUE DATE / TIME: 202211281814
Unit Type and Rh: 6200
Unit Type and Rh: 6200
Unit Type and Rh: 6200
Unit Type and Rh: 6200

## 2021-03-19 LAB — TYPE AND SCREEN
ABO/RH(D): A POS
Antibody Screen: NEGATIVE
Unit division: 0
Unit division: 0
Unit division: 0
Unit division: 0

## 2021-03-19 LAB — COMPREHENSIVE METABOLIC PANEL
ALT: 24 U/L (ref 0–44)
AST: 80 U/L — ABNORMAL HIGH (ref 15–41)
Albumin: 3.2 g/dL — ABNORMAL LOW (ref 3.5–5.0)
Alkaline Phosphatase: 93 U/L (ref 38–126)
Anion gap: 8 (ref 5–15)
BUN: 37 mg/dL — ABNORMAL HIGH (ref 6–20)
CO2: 19 mmol/L — ABNORMAL LOW (ref 22–32)
Calcium: 8.6 mg/dL — ABNORMAL LOW (ref 8.9–10.3)
Chloride: 108 mmol/L (ref 98–111)
Creatinine, Ser: 2.11 mg/dL — ABNORMAL HIGH (ref 0.61–1.24)
GFR, Estimated: 36 mL/min — ABNORMAL LOW (ref >60)
Glucose, Bld: 104 mg/dL — ABNORMAL HIGH (ref 70–99)
Potassium: 3.5 mmol/L (ref 3.5–5.1)
Sodium: 135 mmol/L (ref 135–145)
Total Bilirubin: 9 mg/dL — ABNORMAL HIGH (ref 0.3–1.2)
Total Protein: 6.1 g/dL — ABNORMAL LOW (ref 6.5–8.1)

## 2021-03-19 LAB — CBC
HCT: 20.8 % — ABNORMAL LOW (ref 39.0–52.0)
HCT: 23.7 % — ABNORMAL LOW (ref 39.0–52.0)
Hemoglobin: 7.4 g/dL — ABNORMAL LOW (ref 13.0–17.0)
Hemoglobin: 8.3 g/dL — ABNORMAL LOW (ref 13.0–17.0)
MCH: 34.7 pg — ABNORMAL HIGH (ref 26.0–34.0)
MCH: 34.9 pg — ABNORMAL HIGH (ref 26.0–34.0)
MCHC: 35.6 g/dL (ref 30.0–36.0)
MCHC: 35 g/dL (ref 30.0–36.0)
MCV: 97.7 fL (ref 80.0–100.0)
MCV: 99.6 fL (ref 80.0–100.0)
Platelets: 73 10*3/uL — ABNORMAL LOW (ref 150–400)
Platelets: 77 10*3/uL — ABNORMAL LOW (ref 150–400)
RBC: 2.13 MIL/uL — ABNORMAL LOW (ref 4.22–5.81)
RBC: 2.38 MIL/uL — ABNORMAL LOW (ref 4.22–5.81)
RDW: 19.5 % — ABNORMAL HIGH (ref 11.5–15.5)
RDW: 19.5 % — ABNORMAL HIGH (ref 11.5–15.5)
WBC: 12.3 10*3/uL — ABNORMAL HIGH (ref 4.0–10.5)
WBC: 11.4 10*3/uL — ABNORMAL HIGH (ref 4.0–10.5)
nRBC: 0 % (ref 0.0–0.2)
nRBC: 0 % (ref 0.0–0.2)

## 2021-03-19 SURGERY — ESOPHAGOGASTRODUODENOSCOPY (EGD) WITH PROPOFOL
Anesthesia: Monitor Anesthesia Care

## 2021-03-19 MED ORDER — EPHEDRINE SULFATE-NACL 50-0.9 MG/10ML-% IV SOSY
PREFILLED_SYRINGE | INTRAVENOUS | Status: DC | PRN
  Administered 2021-03-19: 10 mg via INTRAVENOUS
  Administered 2021-03-19: 5 mg via INTRAVENOUS

## 2021-03-19 MED ORDER — PROPOFOL 500 MG/50ML IV EMUL
INTRAVENOUS | Status: DC | PRN
  Administered 2021-03-19: 130 ug/kg/min via INTRAVENOUS

## 2021-03-19 MED ORDER — PROPOFOL 10 MG/ML IV BOLUS
INTRAVENOUS | Status: DC | PRN
  Administered 2021-03-19: 20 mg via INTRAVENOUS
  Administered 2021-03-19: 30 mg via INTRAVENOUS

## 2021-03-19 MED ORDER — PHENYLEPHRINE HCL-NACL 20-0.9 MG/250ML-% IV SOLN
INTRAVENOUS | Status: DC | PRN
  Administered 2021-03-19: 60 ug/min via INTRAVENOUS

## 2021-03-19 MED ORDER — GLYCOPYRROLATE PF 0.2 MG/ML IJ SOSY
PREFILLED_SYRINGE | INTRAMUSCULAR | Status: DC | PRN
  Administered 2021-03-19 (×2): .1 mg via INTRAVENOUS

## 2021-03-19 MED ORDER — PHENYLEPHRINE 40 MCG/ML (10ML) SYRINGE FOR IV PUSH (FOR BLOOD PRESSURE SUPPORT)
PREFILLED_SYRINGE | INTRAVENOUS | Status: DC | PRN
  Administered 2021-03-19 (×3): 120 ug via INTRAVENOUS

## 2021-03-19 MED ORDER — LIDOCAINE HCL (CARDIAC) PF 100 MG/5ML IV SOSY
PREFILLED_SYRINGE | INTRAVENOUS | Status: DC | PRN
  Administered 2021-03-19: 100 mg via INTRATRACHEAL

## 2021-03-19 SURGICAL SUPPLY — 15 items

## 2021-03-19 NOTE — Op Note (Signed)
[facility] Patient Name: Kyle Pitts Procedure Date : 03/19/2021 MRN: [id number] Attending MD: Georgian Co ,  Date of Birth: [birth year] CSN: [id number] Age: 60 Admit Type: Inpatient Procedure:                Upper GI endoscopy Indications:              Anemia Providers:                Adline Mango" Georga Hacking, RN, Frazier Richards, Technician Referring MD:             Hospitalist team Medicines:                Monitored Anesthesia Care Complications:            No immediate complications. Estimated Blood Loss:     Estimated blood loss was minimal. Procedure:                Pre-Anesthesia Assessment:                           - Prior to the procedure, a History and Physical                            was performed, and patient medications and                            allergies were reviewed. The patient's tolerance of                            previous anesthesia was also reviewed. The risks                            and benefits of the procedure and the sedation                            options and risks were discussed with the patient.                            All questions were answered, and informed consent                            was obtained. Prior Anticoagulants: The patient has                            taken no previous anticoagulant or antiplatelet                            agents. ASA Grade Assessment: III - A patient with                            severe systemic disease. After reviewing the risks  and benefits, the patient was deemed in                            satisfactory condition to undergo the procedure.                           After obtaining informed consent, the endoscope was                            passed under direct vision. Throughout the                            procedure, the patient's blood pressure, pulse, and                            oxygen  saturations were monitored continuously. The                            GIF-H190 ([id number]) Olympus endoscope was introduced                            through the mouth, and advanced to the second part                            of duodenum. The upper GI endoscopy was                            accomplished without difficulty. The patient                            tolerated the procedure well. Scope In: Scope Out: Findings:      Grade II varices were found in the middle third of the esophagus and in       the lower third of the esophagus.      Portal hypertensive gastropathy was found in the gastric body.      Many oozing cratered gastric ulcers with an adherent clot (Forrest Class       IIb) were found in the gastric antrum. There was a large blood clot       visualized, which was cleared and revealed underlying oozing. For       hemostasis, two hemostatic clips were successfully placed. There was no       bleeding at the end of the procedure.      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      The examined duodenum was normal. Impression:               - Grade II esophageal varices.                           - Portal hypertensive gastropathy.                           - Oozing gastric ulcers with an adherent clot                            (  Forrest Class IIb). Clips were placed.                           - Gastric antral vascular ectasia without bleeding.                           - Normal examined duodenum.                           - No specimens collected. Recommendation:           - Return patient to hospital ward for ongoing care.                           - It is suspected that the patient's drop in                            hemoglobin is likely due to the gastric ulcers that                            were present in the gastric antrum, which were                            treated with clips. Patient has underlying GAVE,                             but this was not treated today because the gastric                            ulcers seemed to be the most likely etiology.                           - Use a proton pump inhibitor PO BID for 8 weeks.                           - Complete 7 day course of antibiotics with either                            ceftriaxone or ciprofloxacin for SBP prophylaxis in                            the setting of active GI bleed.                           - Avoid NSAIDs/aspirin.                           - Repeat upper endoscopy in 8 weeks to check                            healing.                           - The findings and recommendations were discussed  with the patient. Procedure Code(s):        --- Professional ---                           [phone], Esophagogastroduodenoscopy, flexible,                            transoral; with control of bleeding, any method Diagnosis Code(s):        --- Professional ---                           I85.00, Esophageal varices without bleeding                           K76.6, Portal hypertension                           K31.89, Other diseases of stomach and duodenum                           K25.4, Chronic or unspecified gastric ulcer with                            hemorrhage                           K31.819, Angiodysplasia of stomach and duodenum                            without bleeding                           D64.9, Anemia, unspecified CPT copyright 2019 [employer]. All rights reserved. The codes documented in this report are preliminary and upon coder review may  be revised to meet current compliance requirements. Sonny Masters "Christia Reading,  03/19/2021 11:36:17 AM Number of Addenda: 0

## 2021-03-19 NOTE — Plan of Care (Signed)

## 2021-03-19 NOTE — Transfer of Care (Signed)
Immediate Anesthesia Transfer of Care Note  Patient: Kyle Pitts  Procedure(s) Performed: ESOPHAGOGASTRODUODENOSCOPY (EGD) WITH PROPOFOL HEMOSTASIS CLIP PLACEMENT  Patient Location: PACU  Anesthesia Type:MAC  Level of Consciousness: drowsy  Airway & Oxygen Therapy: Patient Spontanous Breathing and Patient connected to nasal cannula oxygen  Post-op Assessment: Report given to RN and Post -op Vital signs reviewed and stable  Post vital signs: Reviewed and stable  Last Vitals:  Vitals Value Taken Time  BP 102/60 03/19/21 1115  Temp    Pulse 79 03/19/21 1118  Resp 15 03/19/21 1118  SpO2 94 % 03/19/21 1118  Vitals shown include unvalidated device data.  Last Pain:  Vitals:   03/19/21 0930  TempSrc: Temporal  PainSc: 0-No pain         Complications: No notable events documented.

## 2021-03-19 NOTE — Anesthesia Procedure Notes (Signed)
Procedure Name: MAC Date/Time: 03/19/2021 10:54 AM Performed by: Erick Colace, CRNA Pre-anesthesia Checklist: Patient identified, Emergency Drugs available and Suction available Patient Re-evaluated:Patient Re-evaluated prior to induction Oxygen Delivery Method: Simple face mask Preoxygenation: Pre-oxygenation with 100% oxygen Induction Type: IV induction

## 2021-03-19 NOTE — Anesthesia Preprocedure Evaluation (Addendum)
Anesthesia Evaluation  Patient identified by MRN, date of birth, ID band Patient awake    Reviewed: Allergy & Precautions, H&P , NPO status , Patient's Chart, lab work & pertinent test results  Airway Mallampati: II   Neck ROM: full    Dental no notable dental hx.    Pulmonary neg pulmonary ROS,    breath sounds clear to auscultation       Cardiovascular negative cardio ROS Normal cardiovascular exam Rhythm:regular Rate:Normal     Neuro/Psych negative psych ROS   GI/Hepatic (+) Cirrhosis     substance abuse  alcohol use,   Endo/Other  negative endocrine ROS  Renal/GU Renal InsufficiencyRenal disease  negative genitourinary   Musculoskeletal   Abdominal (+) + obese,   Peds  Hematology  (+) Blood dyscrasia, anemia ,   Anesthesia Other Findings   Reproductive/Obstetrics                             Anesthesia Physical  Anesthesia Plan  ASA: 3  Anesthesia Plan: MAC   Post-op Pain Management:    Induction: Intravenous  PONV Risk Score and Plan: 1 and Propofol infusion and Treatment may vary due to age or medical condition  Airway Management Planned: Natural Airway and Mask  Additional Equipment:   Intra-op Plan:   Post-operative Plan:   Informed Consent: I have reviewed the patients History and Physical, chart, labs and discussed the procedure including the risks, benefits and alternatives for the proposed anesthesia with the patient or authorized representative who has indicated his/her understanding and acceptance.     Dental advisory given  Plan Discussed with: CRNA  Anesthesia Plan Comments:        Anesthesia Quick Evaluation

## 2021-03-19 NOTE — Interval H&P Note (Signed)
History and Physical Interval Note:  03/19/2021 10:35 AM  [name]  has presented today for surgery, with the diagnosis of anemia, melena, known esoph varices.  The various methods of treatment have been discussed with the patient and family. After consideration of risks, benefits and other options for treatment, the patient has consented to  Procedure(s): ESOPHAGOGASTRODUODENOSCOPY (EGD) WITH PROPOFOL (N/A) ESOPHAGEAL BANDING (N/A) as a surgical intervention.  The patient's history has been reviewed, patient examined, no change in status, stable for surgery.  I have reviewed the patient's chart and labs.  Questions were answered to the patient's satisfaction.     Kyle Pitts

## 2021-03-19 NOTE — Progress Notes (Signed)
PROGRESS NOTE    Kyle Pitts  [id number] DOB: [birth year] DOA: 03/15/2021 PCP: Default, Provider, MD   Brief Narrative:  Kyle Pitts is a 60 y.o. male with medical history significant of alcoholic cirrhosis with esophageal varices and septic arthritis of right knee who presented to ED after being noted to be more confused by sister on the morning of admission.  He had just recently been hospitalized 10/26-10/30 with likely alcoholic cirrhosis.  Patient had undergone EGD on 10/29 noting grade 2 esophageal varices along with gastric and duodenal ulcers. since getting home he reports to continued use of Mobic although appears to have been recommended to discontinue NSAIDs during his last hospitalization.  Last drink was reported prior to previous hospitalization last month.  He has reported dark stools.    Upon arrival to ED, he was hemodynamically stable. hemoglobin 7.9, creatinine 4.69, AST 140, ALT 51, total bilirubin 11.9, ammonia 153, and INR 1.5. CT scan of the abdomen and pelvis noted significant progression of ascites with generalized soft tissue edema consistent with anasarca with liver noted to be decreased in size with improve steatosis and mild nonspecific right colonic wall thickening.  COVID-19 and influenza screening were negative.  Patient had been given lactulose 30 g x 1 dose.  TRH called to admit.  Assessment & Plan:   Principal Problem:   Hepatic encephalopathy Active Problems:   Hyponatremia   Acute renal failure (ARF) (HCC)   Elevated LFTs   Alcoholic cirrhosis of liver with ascites (HCC)   Gastric ulcer due to nonsteroidal anti-inflammatory drug (NSAID)   Duodenal ulcer   Severe protein-calorie malnutrition (HCC)   Thrombocytopenia (HCC)   Metabolic acidosis   Hepatic encephalopathy secondary to decompensated alcoholic cirrhosis with ascites:  ammonia elevated at 156 upon admission, improved to 56 on 03/16/2021.  Just recently hospitalized for suspected  alcoholic cirrhosis.  Had extensive work-up GI for which alcoholic hepatitis was thought to be less likely by GI.  Noted to have autoimmune markers-IgG elevated to 2342, normal ceruloplasmin hep B surface antigen alpha antitrypsin, antimitochondrial antibody ANA but borderline elevated ASMA.Marland Kitchen  Underwent EGD evaluation 10/29 noted to have gastric and duodenal ulcers.  During his last hospitalization he was not noted to be a candidate for transplant given recent alcohol use. Patient's MELD score is 35 with a 52.6% estimated 36-month mortality.  Seen by GI.  Started on high-dose lactulose, has been having several bowel movements, de-escalated lactulose.  Mental status improved, fully alert and oriented.  Has been started on Rocephin and GI recommends continuing for 7 days due to active GI bleeding.  Underwent diagnostic and therapeutic paracentesis with 4 L of fluid removed on 03/16/2021.  Labs so far not indicative of SBP.  Had another therapeutic paracentesis done on 03/18/2021 with a goal of 2.5 L.  He received albumin.   Nonoliguric acute renal failure with metabolic acidosis: Patient reports no urine output in the last 1 to 2 days prior to admission.  Creatinine of 4.69 with BUN 56 Upon admission, improving now down to 2.  CO2 also at 19, stable today..  Baseline creatinine previously had been around 1 and discharged on 10/30.  Likely hepatorenal syndrome.  Urine output now picking up.  Nephrology on board and management per them.  Hypokalemia: Resolved.   Transaminitis and hyperbilirubinemia: Chronic. Alkaline phosphatase 221,  AST 140, ALT 51, and total bilirubin 11.9.  Total bilirubin appears to be improved from previous 22.8. -Continue to monitor   Macrocytic anemia secondary  to suspect GI bleed,Acute on chronic/gastric and duodenal ulcers: Patient reports having dark stools at home prior to coming in and admitted to still using Mobic.  Last EGD from 10/29 noted gastric and duodenal ulcers.   Hemoglobin on admission noted to be 7.9 with elevated MCV and MCH.  Previously had been 8.8 on discharge on 10/30.  Dropped to 5.8 on afternoon of 03/16/2021, has received 3 unit of PRBC transfusion so far, hemoglobin 7.4.  Goal to keep it between 7 and 8 per GI.  He completed 72 hours of octreotide drip.  EGD completed 03/19/2021 showed bleeding gastric ulcers in the antrum, which were treated with 2x clips. This is suspected to be the source of the patient's hemoglobin drop. Patient has underlying GAVE, but this was not treated today because the gastric ulcers seemed to be the most likely etiology. He also still has large EV and PHG.  GI rec PPI PO BID for 8 weeks. Complete 7 day course of antibiotics with either ceftriaxone or ciprofloxacin for SBP prophylaxis in the setting of active GI bleed. Avoid NSAIDs/aspirin. Repeat upper endoscopy in 8 weeks to check healing.   Acute thrombocytopenia: Drifting down slowly, 73 today.  Not on any heparin products.  Monitor closely.  No indication of transfusion.   Hyponatremia: Acute on chronic.  Resolved.   Severe protein calorie malnutrition: On admission albumin noted to be low at 1.7.  Nutrition consulted.   DVT prophylaxis: SCDs Start: 03/15/21 1420   Code Status: Full Code  Family Communication:  None present at bedside.  Plan of care discussed with patient in length and he verbalized understanding and agreed with it.  I then discussed plan of care with the sister over the phone yesterday.  Status is: Inpatient  Remains inpatient appropriate because: Needs further management of possible GI bleed and acute renal failure.   Estimated body mass index is 32.41 kg/m as calculated from the following:   Height as of this encounter: 6' (1.829 m).   Weight as of this encounter: 108.4 kg.  Nutritional Assessment: Body mass index is 32.41 kg/m.Marland Kitchen Seen by dietician.  I agree with the assessment and plan as outlined below: Nutrition Status: Nutrition  Problem: Increased nutrient needs Etiology: chronic illness (cirrhosis) Skin Assessment: I have examined the patient's skin and I agree with the wound assessment as performed by the wound care RN as outlined below:    Consultants:  Nephrology GI  Procedures:  As above Antimicrobials:  Anti-infectives (From admission, onward)    Start     Dose/Rate Route Frequency Ordered Stop   03/17/21 1730  [MAR Hold]  cefTRIAXone (ROCEPHIN) 2 g in sodium chloride 0.9 % 100 mL IVPB        (MAR Hold since Wed 03/19/2021 at 0918.Hold Reason: Transfer to a Procedural area)   2 g 200 mL/hr over 30 Minutes Intravenous Every 24 hours 03/17/21 0721 03/22/21 1729   03/16/21 2200  [MAR Hold]  rifaximin (XIFAXAN) tablet 550 mg        (MAR Hold since Wed 03/19/2021 at 0918.Hold Reason: Transfer to a Procedural area)   550 mg Oral 2 times daily 03/16/21 2048     03/15/21 1730  cefTRIAXone (ROCEPHIN) 1 g in sodium chloride 0.9 % 100 mL IVPB  Status:  Discontinued        1 g 200 mL/hr over 30 Minutes Intravenous Every 24 hours 03/15/21 1709 03/17/21 0721   03/15/21 1645  ciprofloxacin (CIPRO) IVPB 200 mg  Status:  Discontinued        200 mg 100 mL/hr over 60 Minutes Intravenous Every 24 hours 03/15/21 1644 03/15/21 1709          Subjective:  Seen and examined.  No complaints.  Objective: Vitals:   03/19/21 0526 03/19/21 0930 03/19/21 1115 03/19/21 1130  BP: 112/70 (!) 127/59 102/60 108/71  Pulse: 70 65 85 70  Resp: 14 19 15 15   Temp: 99.3 F (37.4 C) (!) 97.4 F (36.3 C) 97.9 F (36.6 C)   TempSrc: Oral Temporal    SpO2: 93% 99% 93% 93%  Weight: 108.4 kg 108.4 kg    Height:  6' (1.829 m)      Intake/Output Summary (Last 24 hours) at 03/19/2021 1142 Last data filed at 03/19/2021 0611 Gross per 24 hour  Intake 1425.28 ml  Output 1475 ml  Net -49.72 ml    Filed Weights   03/18/21 0447 03/19/21 0526 03/19/21 0930  Weight: 113.8 kg 108.4 kg 108.4 kg    Examination:  General exam:  Appears calm and comfortable, visually icteric Respiratory system: Clear to auscultation. Respiratory effort normal. Cardiovascular system: S1 & S2 heard, RRR. No JVD, murmurs, rubs, gallops or clicks.  +2-3 pitting edema bilateral lower extremity Gastrointestinal system: Abdomen is slightly distended but soft and nontender, very minimal positive fluid shift. No organomegaly or masses felt. Normal bowel sounds heard. Central nervous system: Alert and oriented. No focal neurological deficits. Extremities: Symmetric 5 x 5 power. Skin: No rashes, lesions or ulcers.  Psychiatry: Judgement and insight appear poor   Data Reviewed: I have personally reviewed following labs and imaging studies  CBC: Recent Labs  Lab 03/15/21 0958 03/15/21 1830 03/17/21 0638 03/17/21 1630 03/18/21 0015 03/18/21 1612 03/19/21 0503  WBC 10.0   < > 7.6 6.5 8.1 11.4* 12.3*  NEUTROABS 7.1  --   --   --   --   --   --   HGB 7.9*   < > 6.9* 6.7* 7.4* 7.9* 7.4*  HCT 23.4*   < > 19.5* 19.8* 22.0* 23.0* 20.8*  MCV 106.4*   < > 97.5 100.0 97.8 98.7 97.7  PLT 129*   < > 91* 91* 85* 83* 73*   < > = values in this interval not displayed.    Basic Metabolic Panel: Recent Labs  Lab 03/15/21 0958 03/16/21 0323 03/16/21 1037 03/17/21 0638 03/18/21 0015 03/19/21 0503  NA 131* 133*  --  133*  135 133* 135  K 3.8 3.6  --  2.9*  2.9* 3.6 3.5  CL 103 104  --  104  106 107 108  CO2 21* 19*  --  19*  21* 19* 19*  GLUCOSE 103* 106*  --  88  90 109* 104*  BUN 56* 60*  --  55*  54* 47* 37*  CREATININE 4.69* 4.53*  --  3.44*  3.55* 3.05* 2.11*  CALCIUM 8.4* 8.6*  --  8.3*  8.5* 8.4* 8.6*  MG  --   --  2.2  --   --   --   PHOS  --   --   --  5.1*  --   --     GFR: Estimated Creatinine Clearance: 48.5 mL/min (A) (by C-G formula based on SCr of 2.11 mg/dL (H)). Liver Function Tests: Recent Labs  Lab 03/15/21 0958 03/16/21 0323 03/17/21 0638 03/18/21 0015 03/19/21 0503  AST 140* 134* 87* 90* 80*  ALT 51*  42 29 28 24   ALKPHOS 221*  180* 112 112 93  BILITOT 11.9* 11.7* 10.1* 10.5* 9.0*  PROT 7.5 7.5 6.3* 6.7 6.1*  ALBUMIN 1.7* 2.4* 2.7*  2.7* 3.0* 3.2*    No results for input(s): LIPASE, AMYLASE in the last 168 hours. Recent Labs  Lab 03/15/21 0958 03/16/21 1037  AMMONIA 153* 56*    Coagulation Profile: Recent Labs  Lab 03/15/21 1239  INR 1.5*    Cardiac Enzymes: No results for input(s): CKTOTAL, CKMB, CKMBINDEX, TROPONINI in the last 168 hours. BNP (last 3 results) No results for input(s): PROBNP in the last 8760 hours. HbA1C: No results for input(s): HGBA1C in the last 72 hours. CBG: Recent Labs  Lab 03/15/21 1013  GLUCAP 82    Lipid Profile: No results for input(s): CHOL, HDL, LDLCALC, TRIG, CHOLHDL, LDLDIRECT in the last 72 hours. Thyroid Function Tests: No results for input(s): TSH, T4TOTAL, FREET4, T3FREE, THYROIDAB in the last 72 hours. Anemia Panel: No results for input(s): VITAMINB12, FOLATE, FERRITIN, TIBC, IRON, RETICCTPCT in the last 72 hours. Sepsis Labs: No results for input(s): PROCALCITON, LATICACIDVEN in the last 168 hours.  Recent Results (from the past 240 hour(s))  Resp Panel by RT-PCR (Flu A&B, Covid) Nasopharyngeal Swab     Status: None   Collection Time: 03/15/21 10:14 AM   Specimen: Nasopharyngeal Swab; Nasopharyngeal(NP) swabs in vial transport medium  Result Value Ref Range Status   SARS Coronavirus 2 by RT PCR NEGATIVE NEGATIVE Final    Comment: (NOTE) SARS-CoV-2 target nucleic acids are NOT DETECTED.  The SARS-CoV-2 RNA is generally detectable in upper respiratory specimens during the acute phase of infection. The lowest concentration of SARS-CoV-2 viral copies this assay can detect is 138 copies/mL. A negative result does not preclude SARS-Cov-2 infection and should not be used as the sole basis for treatment or other patient management decisions. A negative result may occur with  improper specimen collection/handling, submission  of specimen other than nasopharyngeal swab, presence of viral mutation(s) within the areas targeted by this assay, and inadequate number of viral copies(<138 copies/mL). A negative result must be combined with clinical observations, patient history, and epidemiological information. The expected result is Negative.  Fact Sheet for Patients:  EntrepreneurPulse.com.au  Fact Sheet for Healthcare Providers:  IncredibleEmployment.be  This test is no t yet approved or cleared by the [place] FDA and  has been authorized for detection and/or diagnosis of SARS-CoV-2 by FDA under an Emergency Use Authorization (EUA). This EUA will remain  in effect (meaning this test can be used) for the duration of the COVID-19 declaration under Section 564(b)(1) of the Act, 21 U.S.C.section 360bbb-3(b)(1), unless the authorization is terminated  or revoked sooner.       Influenza A by PCR NEGATIVE NEGATIVE Final   Influenza B by PCR NEGATIVE NEGATIVE Final    Comment: (NOTE) The Xpert Xpress SARS-CoV-2/FLU/RSV plus assay is intended as an aid in the diagnosis of influenza from Nasopharyngeal swab specimens and should not be used as a sole basis for treatment. Nasal washings and aspirates are unacceptable for Xpert Xpress SARS-CoV-2/FLU/RSV testing.  Fact Sheet for Patients: EntrepreneurPulse.com.au  Fact Sheet for Healthcare Providers: IncredibleEmployment.be  This test is not yet approved or cleared by the [place] FDA and has been authorized for detection and/or diagnosis of SARS-CoV-2 by FDA under an Emergency Use Authorization (EUA). This EUA will remain in effect (meaning this test can be used) for the duration of the COVID-19 declaration under Section 564(b)(1) of the Act, 21 U.S.C. section 360bbb-3(b)(1), unless the  authorization is terminated or revoked.  Performed at [facility], [address],  [place], [place] [address]   Gram stain     Status: None   Collection Time: 03/16/21 12:20 PM   Specimen: Fluid  Result Value Ref Range Status   Specimen Description FLUID  Final   Special Requests  PERITONEAL  Final   Gram Stain   Final    NO WBC SEEN NO ORGANISMS SEEN Performed at [facility], [address], [place], [place] [address]    Report Status 03/17/2021 FINAL  Final  Culture, body fluid w Gram Stain-bottle     Status: None (Preliminary result)   Collection Time: 03/16/21 12:20 PM   Specimen: Fluid  Result Value Ref Range Status   Specimen Description FLUID  Final   Special Requests  PERITONEAL  Final   Culture   Final    NO GROWTH 3 DAYS Performed at [facility], [address], [place], [place] [address]    Report Status PENDING  Incomplete       Radiology Studies: IR Paracentesis  Result Date: 03/19/2021 INDICATION: Patient with history of alcoholic cirrhosis with ascites. Request is for therapeutic paracentesis. 4 L maximum EXAM: ULTRASOUND GUIDED THERAPEUTIC PARACENTESIS MEDICATIONS: Lidocaine 1% 10 mL COMPLICATIONS: None immediate. PROCEDURE: Informed written consent was obtained from the patient after a discussion of the risks, benefits and alternatives to treatment. A timeout was performed prior to the initiation of the procedure. Initial ultrasound scanning demonstrates a small amount of ascites within the right lower abdominal quadrant. The right lower abdomen was prepped and draped in the usual sterile fashion. 1% lidocaine was used for local anesthesia. Following this, a 19 gauge, 7-cm, Yueh catheter was introduced. An ultrasound image was saved for documentation purposes. The paracentesis was performed. The catheter was removed and a dressing was applied. The patient tolerated the procedure well without immediate post procedural complication. FINDINGS: A total of approximately 2.5 L of bright yellow colored fluid was removed. IMPRESSION: Successful  ultrasound-guided therapeutic paracentesis yielding 2.5 liters of peritoneal fluid. Read by: Anders Grant, NP Electronically Signed   By: Acquanetta Belling M.D.   On: 03/19/2021 07:57    Scheduled Meds:  [MAR Hold] feeding supplement  237 mL Oral BID BM   [MAR Hold] lactulose  20 g Oral BID   [MAR Hold] midodrine  5 mg Oral TID WC   [MAR Hold] multivitamin with minerals  1 tablet Oral Daily   [MAR Hold] pantoprazole  40 mg Oral BID   [MAR Hold] rifaximin  550 mg Oral BID   [MAR Hold] sodium chloride flush  3 mL Intravenous Q12H   Continuous Infusions:  [MAR Hold] albumin human 50 g (03/19/21 0818)   [MAR Hold] cefTRIAXone (ROCEPHIN)  IV Stopped (03/18/21 1632)     LOS: 4 days   Time spent: 30 minutes   Hughie Closs, MD [facility]  03/19/2021, 11:42 AM  Please page via Loretha Stapler and do not message via secure chat for anything urgent. Secure chat can be used for anything non urgent.  How to contact the [facility] Attending or Consulting provider 7A - 7P or covering provider during after hours 7P -7A, for this patient?  Check the care team in [facility] and look for a) attending/consulting TR[facility] provider listed and b) the [facility] team listed. Page or secure chat 7A-7P. Log into [email or web address] and use [facility] universal password to access. If you do not have the password, please  contact the hospital operator. Locate the [facility] provider you are looking for under [facility] and page to a number that you can be directly reached. If you still have difficulty reaching the provider, please page the [facility] (Director on Call) for the Hospitalists listed on amion for assistance.

## 2021-03-19 NOTE — Progress Notes (Signed)
[facility] NEPHROLOGY PROGRESS NOTE  Assessment/ Plan:  # Acute kidney injury, nonoliguric: Likely type I hepatorenal syndrome.  UA bland, urine sodium <10 and kidneys unremarkable and CT scan.  Patient is nonoliguric and serum creatinine level significantly improved with albumin, midodrine and octreotide.  Discontinue albumin and octreotide however can continue midodrine for hypotension.  Continue to monitor lab.  # Hyponatremia: Secondary to cirrhosis and inappropriate ADH activation from decreased effective arterial blood volume.  Serum sodium level improved.  #Acute hepatic encephalopathy: Improved mental status, treated with lactulose.  #Acute decompensated liver cirrhosis: Seen by GI.  Diagnostic paracentesis was negative for SBP.  Added Xifaxan.  Underwent endoscopy today with grade second varices, portal hypertensive gastropathy and many oozing gastric ulcers.  Hemostatic clips were successfully placed.  #  Anemia: Likely secondary to cirrhosis with associated GI bleed from recent gastritis and duodenitis in the setting of ongoing meloxicam use.  PPI, blood transfusion per primary team.  EGD today.  #Thrombocytopenia: Secondary to portal hypertension and splenic sequestration.   Patient is having renal recovery.  Nothing further to add.  Sign off, please call back with question.  Subjective: Seen and examined at bedside.  Urine output 1.6 L.  Underwent endoscopy today.  Denies nausea vomiting chest pain or shortness of breath.  Objective Vital signs in last 24 hours: Vitals:   03/19/21 1115 03/19/21 1130 03/19/21 1145 03/19/21 1220  BP: 102/60 108/71 119/81 125/74  Pulse: 85 70 72 62  Resp: 15 15 18 19   Temp: 97.9 F (36.6 C)  97.9 F (36.6 C) 98 F (36.7 C)  TempSrc:    Oral  SpO2: 93% 93% 95% 95%  Weight:      Height:       Weight change: -5.436 kg  Intake/Output Summary (Last 24 hours) at 03/19/2021 1615 Last data filed at 03/19/2021 03/21/2021 Gross per  24 hour  Intake 1185.28 ml  Output 1275 ml  Net -89.72 ml        Labs: Basic Metabolic Panel: Recent Labs  Lab 03/17/21 0638 03/18/21 0015 03/19/21 0503  NA 133*  135 133* 135  K 2.9*  2.9* 3.6 3.5  CL 104  106 107 108  CO2 19*  21* 19* 19*  GLUCOSE 88  90 109* 104*  BUN 55*  54* 47* 37*  CREATININE 3.44*  3.55* 3.05* 2.11*  CALCIUM 8.3*  8.5* 8.4* 8.6*  PHOS 5.1*  --   --     Liver Function Tests: Recent Labs  Lab 03/17/21 0638 03/18/21 0015 03/19/21 0503  AST 87* 90* 80*  ALT 29 28 24   ALKPHOS 112 112 93  BILITOT 10.1* 10.5* 9.0*  PROT 6.3* 6.7 6.1*  ALBUMIN 2.7*  2.7* 3.0* 3.2*    No results for input(s): LIPASE, AMYLASE in the last 168 hours. Recent Labs  Lab 03/15/21 0958 03/16/21 1037  AMMONIA 153* 56*    CBC: Recent Labs  Lab 03/15/21 0958 03/15/21 1830 03/17/21 0638 03/17/21 1630 03/18/21 0015 03/18/21 1612 03/19/21 0503  WBC 10.0   < > 7.6 6.5 8.1 11.4* 12.3*  NEUTROABS 7.1  --   --   --   --   --   --   HGB 7.9*   < > 6.9* 6.7* 7.4* 7.9* 7.4*  HCT 23.4*   < > 19.5* 19.8* 22.0* 23.0* 20.8*  MCV 106.4*   < > 97.5 100.0 97.8 98.7 97.7  PLT 129*   < > 91* 91* 85* 83* 73*   < > =  values in this interval not displayed.    Cardiac Enzymes: No results for input(s): CKTOTAL, CKMB, CKMBINDEX, TROPONINI in the last 168 hours. CBG: Recent Labs  Lab 03/15/21 1013  GLUCAP 82     Iron Studies: No results for input(s): IRON, TIBC, TRANSFERRIN, FERRITIN in the last 72 hours. Studies/Results: IR Paracentesis  Result Date: 03/19/2021 INDICATION: Patient with history of alcoholic cirrhosis with ascites. Request is for therapeutic paracentesis. 4 L maximum EXAM: ULTRASOUND GUIDED THERAPEUTIC PARACENTESIS MEDICATIONS: Lidocaine 1% 10 mL COMPLICATIONS: None immediate. PROCEDURE: Informed written consent was obtained from the patient after a discussion of the risks, benefits and alternatives to treatment. A timeout was performed prior to  the initiation of the procedure. Initial ultrasound scanning demonstrates a small amount of ascites within the right lower abdominal quadrant. The right lower abdomen was prepped and draped in the usual sterile fashion. 1% lidocaine was used for local anesthesia. Following this, a 19 gauge, 7-cm, Yueh catheter was introduced. An ultrasound image was saved for documentation purposes. The paracentesis was performed. The catheter was removed and a dressing was applied. The patient tolerated the procedure well without immediate post procedural complication. FINDINGS: A total of approximately 2.5 L of bright yellow colored fluid was removed. IMPRESSION: Successful ultrasound-guided therapeutic paracentesis yielding 2.5 liters of peritoneal fluid. Read by: Rushie Nyhan, NP Electronically Signed   By: Miachel Roux M.D.   On: 03/19/2021 07:57    Medications: Infusions:  albumin human 50 g (03/19/21 0818)   cefTRIAXone (ROCEPHIN)  IV Stopped (03/18/21 1632)    Scheduled Medications:  feeding supplement  237 mL Oral BID BM   lactulose  20 g Oral BID   midodrine  5 mg Oral TID WC   multivitamin with minerals  1 tablet Oral Daily   pantoprazole  40 mg Oral BID   rifaximin  550 mg Oral BID   sodium chloride flush  3 mL Intravenous Q12H    have reviewed scheduled and prn medications.  Physical Exam: General: Not in distress, comfortable Heart:RRR, s1s2 nl Lungs:clear b/l, no crackle Abdomen:soft, Non-tender Extremities: Minimal leg edema which is stable. Neurology: Alert, awake and following commands  Kyle Pitts 03/19/2021,4:15 PM  LOS: 4 days

## 2021-03-19 NOTE — Anesthesia Postprocedure Evaluation (Signed)
Anesthesia Post Note  Patient: Kyle Pitts  Procedure(s) Performed: ESOPHAGOGASTRODUODENOSCOPY (EGD) WITH PROPOFOL HEMOSTASIS CLIP PLACEMENT     Patient location during evaluation: Endoscopy Anesthesia Type: MAC Level of consciousness: awake Pain management: pain level controlled Vital Signs Assessment: post-procedure vital signs reviewed and stable Respiratory status: spontaneous breathing Cardiovascular status: stable Postop Assessment: no apparent nausea or vomiting Anesthetic complications: no   No notable events documented.  Last Vitals:  Vitals:   03/19/21 1130 03/19/21 1145  BP: 108/71   Pulse: 70 72  Resp: 15 18  Temp:    SpO2: 93% 95%    Last Pain:  Vitals:   03/19/21 1145  TempSrc:   PainSc: 0-No pain                 Huston Foley

## 2021-03-20 ENCOUNTER — Telehealth: Payer: Self-pay

## 2021-03-20 ENCOUNTER — Other Ambulatory Visit: Payer: Self-pay

## 2021-03-20 DIAGNOSIS — K767 Hepatorenal syndrome: Secondary | ICD-10-CM

## 2021-03-20 DIAGNOSIS — K7290 Hepatic failure, unspecified without coma: Secondary | ICD-10-CM

## 2021-03-20 DIAGNOSIS — K7460 Unspecified cirrhosis of liver: Secondary | ICD-10-CM

## 2021-03-20 LAB — CBC
HCT: 23 % — ABNORMAL LOW (ref 39.0–52.0)
Hemoglobin: 7.9 g/dL — ABNORMAL LOW (ref 13.0–17.0)
MCH: 34.1 pg — ABNORMAL HIGH (ref 26.0–34.0)
MCHC: 34.3 g/dL (ref 30.0–36.0)
MCV: 99.1 fL (ref 80.0–100.0)
Platelets: 77 10*3/uL — ABNORMAL LOW (ref 150–400)
RBC: 2.32 MIL/uL — ABNORMAL LOW (ref 4.22–5.81)
RDW: 18.9 % — ABNORMAL HIGH (ref 11.5–15.5)
WBC: 10.7 10*3/uL — ABNORMAL HIGH (ref 4.0–10.5)
nRBC: 0 % (ref 0.0–0.2)

## 2021-03-20 MED ORDER — PANTOPRAZOLE SODIUM 40 MG IV SOLR
40.0000 mg | Freq: Two times a day (BID) | INTRAVENOUS | Status: AC
  Administered 2021-03-20 (×2): 40 mg via INTRAVENOUS
  Filled 2021-03-20 (×2): qty 40

## 2021-03-20 MED ORDER — PANTOPRAZOLE SODIUM 40 MG PO TBEC
40.0000 mg | DELAYED_RELEASE_TABLET | Freq: Two times a day (BID) | ORAL | Status: DC
  Administered 2021-03-21 – 2021-03-22 (×2): 40 mg via ORAL
  Filled 2021-03-20 (×2): qty 1

## 2021-03-20 NOTE — Telephone Encounter (Signed)
-----   Message from Jenel Lucks, MD sent at 03/20/2021  1:54 PM EST ----- Yes please ----- Message ----- From: Chrystie Nose, RN Sent: 03/20/2021   1:41 PM EST To: Jenel Lucks, MD  Orders for labs in epic. You have a lot of appts available on 04/03/21. Scheduled him to see you 12/15@10 :30am. Do I need to notify the pt? ----- Message ----- From: Jenel Lucks, MD Sent: 03/20/2021   1:15 PM EST To: Chrystie Nose, RN  Bonita Quin,  Can you order the CBC and CMP and see if I have any clinic slots in the next 2-3 weeks for Mr. Miyoshi? Thanks ----- Message ----- From: Shellia Cleverly, DO Sent: 03/20/2021  10:56 AM EST To: Missy Sabins, RN, Jenel Lucks, MD  Getting this patient set up for discharge in the next 24-48 hours hopefully.  Hoping to coordinate the following:  - Repeat CBC and BMP in 7 days - Cannot restart Lasix/Aldactone now due to recovery from hepatorenal syndrome, but can hopefully start as an outpatient as renal function and BP allow - Similarly, cannot restart propranolol as he is being treated with midodrine for BP support and might need to remain on that as outpatient for a while - Office follow-up with Dr. Tomasa Rand or one of the APPs in the next 2-3 weeks if possible - Repeat EGD as outpatient in the next 8 weeks or so.  If unable to continue beta-blocker as outpatient, might need to consider doing serial EGD with variceal banding as treatment  Thanks!

## 2021-03-20 NOTE — Progress Notes (Signed)
PROGRESS NOTE    Kyle Pitts  [id number] DOB: 30-Apr-1960 DOA: 03/15/2021 PCP: Default, Provider, MD   Brief Narrative:  Kyle Pitts is a 60 y.o. male with medical history significant of alcoholic cirrhosis with esophageal varices and septic arthritis of right knee who presented to ED after being noted to be more confused by sister on the morning of admission.  He had just recently been hospitalized 10/26-10/30 with likely alcoholic cirrhosis.  Patient had undergone EGD on 10/29 noting grade 2 esophageal varices along with gastric and duodenal ulcers. since getting home he reports to continued use of Mobic although appears to have been recommended to discontinue NSAIDs during his last hospitalization.  Home in the AM with close GI follow up?  Assessment & Plan:   Principal Problem:   Hepatic encephalopathy Active Problems:   Hyponatremia   Acute renal failure (ARF) (HCC)   Elevated LFTs   Alcoholic cirrhosis of liver with ascites (HCC)   Gastric ulcer due to nonsteroidal anti-inflammatory drug (NSAID)   Duodenal ulcer   Severe protein-calorie malnutrition (HCC)   Thrombocytopenia (HCC)   Metabolic acidosis   Hepatorenal syndrome (HCC)   Hepatic encephalopathy secondary to decompensated alcoholic cirrhosis with ascites:   -ammonia elevated at 156 upon admission, improved to 56 on 03/16/2021.  J -  Had extensive work-up GI for which alcoholic hepatitis was thought to be less likely by GI.  Noted to have autoimmune markers-IgG elevated to 2342, normal ceruloplasmin hep B surface antigen alpha antitrypsin, antimitochondrial antibody ANA but borderline elevated ASMA..   -Underwent EGD evaluation 10/29 noted to have gastric and duodenal ulcers.  During his last hospitalization he was not noted to be a candidate for transplant given recent alcohol use. Patient's MELD score is 35 with a 52.6% estimated 62-month mortality.  GI consult:  - started on Rocephin and GI recommends  continuing for 7 days due to active GI bleeding.  Underwent diagnostic and therapeutic paracentesis with 4 L of fluid removed on 03/16/2021.  Labs so far not indicative of SBP.  Had another therapeutic paracentesis done on 03/18/2021 with a goal of 2.5 L.  He received albumin. - Will eventually need to start on Lasix/Aldactone when renal function allows.  Would not start at this juncture given active recovery from HRS.  When renal function improves enough, will start at Lasix 20 mg/Aldactone 50 mg and titrate   Nonoliguric acute renal failure with metabolic acidosis:  -likely HRS -Cr trending down  Hypokalemia: treated   Transaminitis and hyperbilirubinemia:  -outpatient GI follow up   anemia secondary to suspect GI bleed,Acute on chronic/gastric and duodenal ulcers:  - EGD completed 03/19/2021 showed bleeding gastric ulcers in the antrum, which were treated with 2x clips. This is suspected to be the source of the patient's hemoglobin drop. Patient has underlying GAVE, but this was not treated today because the gastric ulcers seemed to be the most likely etiology. He also still has large EV and PHG.  GI rec PPI PO BID for 8 weeks. Complete 7 day course of antibiotics with either ceftriaxone or ciprofloxacin for SBP prophylaxis in the setting of active GI bleed. Avoid NSAIDs/aspirin. Repeat upper endoscopy in 8 weeks to check healing.   Acute thrombocytopenia: -stable -no sign of bleeding   Hyponatremia:  -stable   Severe protein calorie malnutrition:  Nutrition Status: Nutrition Problem: Increased nutrient needs Etiology: chronic illness (cirrhosis) Signs/Symptoms: estimated needs Interventions: Ensure Enlive (each supplement provides 350kcal and 20 grams of protein), Snacks,  MVI     DVT prophylaxis: SCDs Start: 03/15/21 1420   Code Status: Full Code  Family Communication:  sister on phone for extended period of time  Status is: Inpatient  Remains inpatient appropriate because:  home in the AM   Consultants:  Nephrology GI     Subjective: Knows he needs to take his lactulose   Objective: Vitals:   03/19/21 1922 03/20/21 0325 03/20/21 0328 03/20/21 1117  BP: 94/77 109/73  106/70  Pulse: 68 70  68  Resp: 15 16  20   Temp: 98.1 F (36.7 C) 99.1 F (37.3 C)  99.1 F (37.3 C)  TempSrc: Oral Oral  Oral  SpO2: 96% 97%  96%  Weight:   110.3 kg   Height:        Intake/Output Summary (Last 24 hours) at 03/20/2021 1650 Last data filed at 03/20/2021 1232 Gross per 24 hour  Intake 1923 ml  Output 875 ml  Net 1048 ml   Filed Weights   03/19/21 0526 03/19/21 0930 03/20/21 0328  Weight: 108.4 kg 108.4 kg 110.3 kg    Examination:   General: Appearance:    Obese male in no acute distress     Lungs:     respirations unlabored  Heart:    Normal heart rate.    MS:   All extremities are intact.    Neurologic:   Awake, alert, oriented x 3.      Data Reviewed: I have personally reviewed following labs and imaging studies  CBC: Recent Labs  Lab 03/15/21 0958 03/15/21 1830 03/18/21 0015 03/18/21 1612 03/19/21 0503 03/19/21 1637 03/20/21 0453  WBC 10.0   < > 8.1 11.4* 12.3* 11.4* 10.7*  NEUTROABS 7.1  --   --   --   --   --   --   HGB 7.9*   < > 7.4* 7.9* 7.4* 8.3* 7.9*  HCT 23.4*   < > 22.0* 23.0* 20.8* 23.7* 23.0*  MCV 106.4*   < > 97.8 98.7 97.7 99.6 99.1  PLT 129*   < > 85* 83* 73* 77* 77*   < > = values in this interval not displayed.   Basic Metabolic Panel: Recent Labs  Lab 03/15/21 0958 03/16/21 0323 03/16/21 1037 03/17/21 0638 03/18/21 0015 03/19/21 0503  NA 131* 133*  --  133*  135 133* 135  K 3.8 3.6  --  2.9*  2.9* 3.6 3.5  CL 103 104  --  104  106 107 108  CO2 21* 19*  --  19*  21* 19* 19*  GLUCOSE 103* 106*  --  88  90 109* 104*  BUN 56* 60*  --  55*  54* 47* 37*  CREATININE 4.69* 4.53*  --  3.44*  3.55* 3.05* 2.11*  CALCIUM 8.4* 8.6*  --  8.3*  8.5* 8.4* 8.6*  MG  --   --  2.2  --   --   --   PHOS  --   --    --  5.1*  --   --    GFR: Estimated Creatinine Clearance: 49 mL/min (A) (by C-G formula based on SCr of 2.11 mg/dL (H)). Liver Function Tests: Recent Labs  Lab 03/15/21 0958 03/16/21 0323 03/17/21 0638 03/18/21 0015 03/19/21 0503  AST 140* 134* 87* 90* 80*  ALT 51* 42 29 28 24   ALKPHOS 221* 180* 112 112 93  BILITOT 11.9* 11.7* 10.1* 10.5* 9.0*  PROT 7.5 7.5 6.3* 6.7 6.1*  ALBUMIN 1.7* 2.4*  2.7*  2.7* 3.0* 3.2*   No results for input(s): LIPASE, AMYLASE in the last 168 hours. Recent Labs  Lab 03/15/21 0958 03/16/21 1037  AMMONIA 153* 56*   Coagulation Profile: Recent Labs  Lab 03/15/21 1239  INR 1.5*   Cardiac Enzymes: No results for input(s): CKTOTAL, CKMB, CKMBINDEX, TROPONINI in the last 168 hours. BNP (last 3 results) No results for input(s): PROBNP in the last 8760 hours. HbA1C: No results for input(s): HGBA1C in the last 72 hours. CBG: Recent Labs  Lab 03/15/21 1013  GLUCAP 82   Lipid Profile: No results for input(s): CHOL, HDL, LDLCALC, TRIG, CHOLHDL, LDLDIRECT in the last 72 hours. Thyroid Function Tests: No results for input(s): TSH, T4TOTAL, FREET4, T3FREE, THYROIDAB in the last 72 hours. Anemia Panel: No results for input(s): VITAMINB12, FOLATE, FERRITIN, TIBC, IRON, RETICCTPCT in the last 72 hours. Sepsis Labs: No results for input(s): PROCALCITON, LATICACIDVEN in the last 168 hours.  Recent Results (from the past 240 hour(s))  Resp Panel by RT-PCR (Flu A&B, Covid) Nasopharyngeal Swab     Status: None   Collection Time: 03/15/21 10:14 AM   Specimen: Nasopharyngeal Swab; Nasopharyngeal(NP) swabs in vial transport medium  Result Value Ref Range Status   SARS Coronavirus 2 by RT PCR NEGATIVE NEGATIVE Final    Comment: (NOTE) SARS-CoV-2 target nucleic acids are NOT DETECTED.  The SARS-CoV-2 RNA is generally detectable in upper respiratory specimens during the acute phase of infection. The lowest concentration of SARS-CoV-2 viral copies this  assay can detect is 138 copies/mL. A negative result does not preclude SARS-Cov-2 infection and should not be used as the sole basis for treatment or other patient management decisions. A negative result may occur with  improper specimen collection/handling, submission of specimen other than nasopharyngeal swab, presence of viral mutation(s) within the areas targeted by this assay, and inadequate number of viral copies(<138 copies/mL). A negative result must be combined with clinical observations, patient history, and epidemiological information. The expected result is Negative.  Fact Sheet for Patients:  BloggerCourse.com  Fact Sheet for Healthcare Providers:  SeriousBroker.it  This test is no t yet approved or cleared by the [place] FDA and  has been authorized for detection and/or diagnosis of SARS-CoV-2 by FDA under an Emergency Use Authorization (EUA). This EUA will remain  in effect (meaning this test can be used) for the duration of the COVID-19 declaration under Section 564(b)(1) of the Act, 21 U.S.C.section 360bbb-3(b)(1), unless the authorization is terminated  or revoked sooner.       Influenza A by PCR NEGATIVE NEGATIVE Final   Influenza B by PCR NEGATIVE NEGATIVE Final    Comment: (NOTE) The Xpert Xpress SARS-CoV-2/FLU/RSV plus assay is intended as an aid in the diagnosis of influenza from Nasopharyngeal swab specimens and should not be used as a sole basis for treatment. Nasal washings and aspirates are unacceptable for Xpert Xpress SARS-CoV-2/FLU/RSV testing.  Fact Sheet for Patients: BloggerCourse.com  Fact Sheet for Healthcare Providers: SeriousBroker.it  This test is not yet approved or cleared by the [place] FDA and has been authorized for detection and/or diagnosis of SARS-CoV-2 by FDA under an Emergency Use Authorization (EUA). This EUA will  remain in effect (meaning this test can be used) for the duration of the COVID-19 declaration under Section 564(b)(1) of the Act, 21 U.S.C. section 360bbb-3(b)(1), unless the authorization is terminated or revoked.  Performed at [facility], [address], [place], [place] [address]   Gram stain  Status: None   Collection Time: 03/16/21 12:20 PM   Specimen: Fluid  Result Value Ref Range Status   Specimen Description FLUID  Final   Special Requests  PERITONEAL  Final   Gram Stain   Final    NO WBC SEEN NO ORGANISMS SEEN Performed at [facility], [address] [place], [place] [address]    Report Status 03/17/2021 FINAL  Final  Culture, body fluid w Gram Stain-bottle     Status: None (Preliminary result)   Collection Time: 03/16/21 12:20 PM   Specimen: Fluid  Result Value Ref Range Status   Specimen Description FLUID  Final   Special Requests  PERITONEAL  Final   Culture   Final    NO GROWTH 4 DAYS Performed at [facility] [address] [place], [place] [address]    Report Status PENDING  Incomplete       Radiology Studies: No results found.  Scheduled Meds:  feeding supplement  237 mL Oral BID BM   lactulose  20 g Oral BID   midodrine  5 mg Oral TID WC   multivitamin with minerals  1 tablet Oral Daily   [START ON 03/21/2021] pantoprazole  40 mg Oral BID   pantoprazole (PROTONIX) IV  40 mg Intravenous Q12H   rifaximin  550 mg Oral BID   sodium chloride flush  3 mL Intravenous Q12H   Continuous Infusions:  cefTRIAXone (ROCEPHIN)  IV 2 g (03/19/21 1656)     LOS: 5 days   Time spent: 30 minutes   Geradine Girt, DO [facility]  03/20/2021, 4:50 PM  Please page via [place] and do not message via secure chat for anything urgent. Secure chat can be used for anything non urgent.  How to contact the [facility] Attending or Consulting provider [place] or covering provider during after hours [place], for this patient?  Check the care team  in [facility] and look for a) attending/consulting TR[facility] provider listed and b) the [facility] team listed. Page or secure chat [phone]. Log into [email or web address] and use [place]'s universal password to access. If you do not have the password, please contact the hospital operator. Locate the [facility] provider you are looking for under [facility] and page to a number that you can be directly reached. If you still have difficulty reaching the provider, please page the [facility] (Director on Call) for the Hospitalists listed on amion for assistance.

## 2021-03-20 NOTE — Telephone Encounter (Signed)
Pt knows to come for labs next week, orders in epic. Pt scheduled to see Dr. Tomasa Rand 04/03/21@10 :30am. Pt aware.

## 2021-03-20 NOTE — Progress Notes (Addendum)
[facility] ROUNDING NOTE   Subjective: EGD completed yesterday notable for grade 2 esophageal varices, portal hypertensive gastropathy, gastric ulcers with active oozing, treated with hemostatic clips, along with nonbleeding GAVE.  Also underwent paracentesis with 2.5 L removed.  No complaints this morning.  Objective: Vital signs in last 24 hours: Temp:  [97.9 F (36.6 C)-99.1 F (37.3 C)] 99.1 F (37.3 C) (12/01 0325) Pulse Rate:  [62-85] 70 (12/01 0325) Resp:  [15-19] 16 (12/01 0325) BP: (94-125)/(60-81) 109/73 (12/01 0325) SpO2:  [93 %-97 %] 97 % (12/01 0325) Weight:  [110.3 kg] 110.3 kg (12/01 0328) Last BM Date: 03/18/21 General: NAD Lungs:  CTA b/l, no w/r/r Heart:  RRR, no m/r/g Abdomen:  Soft, NT, ND, +BS Ext: 2+ pitting edema bilaterally   Intake/Output from previous day: 11/30 0701 - 12/01 0700 In: 907 [P.O.:907] Out: 600 [Urine:600] Intake/Output this shift: Total I/O In: 476 [P.O.:476] Out: 275 [Urine:275]   Lab Results: Recent Labs    03/19/21 0503 03/19/21 1637 03/20/21 0453  WBC 12.3* 11.4* 10.7*  HGB 7.4* 8.3* 7.9*  PLT 73* 77* 77*  MCV 97.7 99.6 99.1   BMET Recent Labs    03/18/21 0015 03/19/21 0503  NA 133* 135  K 3.6 3.5  CL 107 108  CO2 19* 19*  GLUCOSE 109* 104*  BUN 47* 37*  CREATININE 3.05* 2.11*  CALCIUM 8.4* 8.6*   LFT Recent Labs    03/18/21 0015 03/19/21 0503  PROT 6.7 6.1*  ALBUMIN 3.0* 3.2*  AST 90* 80*  ALT 28 24  ALKPHOS 112 93  BILITOT 10.5* 9.0*   PT/INR No results for input(s): INR in the last 72 hours.    Imaging/Other results: IR Paracentesis  Result Date: 03/19/2021 INDICATION: Patient with history of alcoholic cirrhosis with ascites. Request is for therapeutic paracentesis. 4 L maximum EXAM: ULTRASOUND GUIDED THERAPEUTIC PARACENTESIS MEDICATIONS: Lidocaine 1% 10 mL COMPLICATIONS: None immediate. PROCEDURE: Informed written consent was obtained from the patient after a discussion of  the risks, benefits and alternatives to treatment. A timeout was performed prior to the initiation of the procedure. Initial ultrasound scanning demonstrates a small amount of ascites within the right lower abdominal quadrant. The right lower abdomen was prepped and draped in the usual sterile fashion. 1% lidocaine was used for local anesthesia. Following this, a 19 gauge, 7-cm, Yueh catheter was introduced. An ultrasound image was saved for documentation purposes. The paracentesis was performed. The catheter was removed and a dressing was applied. The patient tolerated the procedure well without immediate post procedural complication. FINDINGS: A total of approximately 2.5 L of bright yellow colored fluid was removed. IMPRESSION: Successful ultrasound-guided therapeutic paracentesis yielding 2.5 liters of peritoneal fluid. Read by: Anders Grant, NP Electronically Signed   By: Acquanetta Belling M.D.   On: 03/19/2021 07:57      Assessment and Plan:  1) EtOH cirrhosis 2) Esophageal varices 3) Ascites (negative for SBP) 4) Portal hypertensive gastropathy 5) Hepatic encephalopathy 6) Thrombocytopenia 7) Coagulopathy - Continue rifaximin/lactulose - Will eventually plan to restart nonselective beta-blocker as outpatient if BP allows.  Was previously on propranolol 20 mg bid with goal to titrate HR 55-60, but currently on midodrine for BP support.  If running into hypotension issues, may need to discontinue with consideration of serial EGD with EVL as outpatient - Will eventually need to start on Lasix/Aldactone when renal function allows.  Would not start at this juncture given active recovery from HRS.  When renal function improves enough, will start  at Lasix 20 mg/Aldactone 50 mg and titrate - Can discontinue ABX - Plan for repeat labs as outpatient to recalculate MELD as renal function improves - Continue low-sodium diet, <2 gm per day  8) Acute blood loss anemia 9) Gastric ulcers with  bleeding - Continue Protonix 40 mg IV twice daily today, then convert to po bid x8 weeks, then reduce to 40 mg daily - Plan for repeat upper endoscopy in about 8 weeks as outpatient - H/H stable 7.9/23 after unit PRBC transfusion earlier this week.  No continued overt bleeding - Repeat CBC 7 days after hospital discharge  10) HRS - Renal function improving.  Creatinine 2.11 yesterday.  Completed octreotide.  Midodrine was continued for BP support - Repeat BMP 7 days after hospital discharge to ensure continued improvement/normalization - Can hopefully start Lasix/Aldactone as outpatient when renal function returns near baseline and as BP allows  GI service will sign off at this time.  Will coordinate outpatient follow-up.  Please do not hesitate to contact inpatient service with additional questions or concerns.  Lavena Bullion, DO  03/20/2021, 10:37 AM [facility] Pager [phone]

## 2021-03-21 ENCOUNTER — Inpatient Hospital Stay (HOSPITAL_COMMUNITY): Payer: Medicaid Other

## 2021-03-21 ENCOUNTER — Other Ambulatory Visit (HOSPITAL_COMMUNITY): Payer: Self-pay

## 2021-03-21 ENCOUNTER — Encounter (HOSPITAL_COMMUNITY): Payer: Self-pay | Admitting: Internal Medicine

## 2021-03-21 LAB — CBC
HCT: 23.6 % — ABNORMAL LOW (ref 39.0–52.0)
Hemoglobin: 8 g/dL — ABNORMAL LOW (ref 13.0–17.0)
MCH: 33.8 pg (ref 26.0–34.0)
MCHC: 33.9 g/dL (ref 30.0–36.0)
MCV: 99.6 fL (ref 80.0–100.0)
Platelets: 80 10*3/uL — ABNORMAL LOW (ref 150–400)
RBC: 2.37 MIL/uL — ABNORMAL LOW (ref 4.22–5.81)
RDW: 18.3 % — ABNORMAL HIGH (ref 11.5–15.5)
WBC: 11.3 10*3/uL — ABNORMAL HIGH (ref 4.0–10.5)
nRBC: 0 % (ref 0.0–0.2)

## 2021-03-21 LAB — BASIC METABOLIC PANEL
Anion gap: 4 — ABNORMAL LOW (ref 5–15)
BUN: 25 mg/dL — ABNORMAL HIGH (ref 6–20)
CO2: 22 mmol/L (ref 22–32)
Calcium: 8.6 mg/dL — ABNORMAL LOW (ref 8.9–10.3)
Chloride: 107 mmol/L (ref 98–111)
Creatinine, Ser: 1.69 mg/dL — ABNORMAL HIGH (ref 0.61–1.24)
GFR, Estimated: 46 mL/min — ABNORMAL LOW (ref >60)
Glucose, Bld: 116 mg/dL — ABNORMAL HIGH (ref 70–99)
Potassium: 3.5 mmol/L (ref 3.5–5.1)
Sodium: 133 mmol/L — ABNORMAL LOW (ref 135–145)

## 2021-03-21 LAB — CULTURE, BODY FLUID W GRAM STAIN -BOTTLE: Culture: NO GROWTH

## 2021-03-21 MED ORDER — MIDODRINE HCL 5 MG PO TABS
5.0000 mg | ORAL_TABLET | Freq: Three times a day (TID) | ORAL | 0 refills | Status: DC
  Filled 2021-03-21: qty 90, 30d supply, fill #0

## 2021-03-21 MED ORDER — LACTULOSE ENCEPHALOPATHY 10 GM/15ML PO SOLN
20.0000 g | Freq: Two times a day (BID) | ORAL | 0 refills | Status: DC
Start: 2021-03-21 — End: 2021-04-10
  Filled 2021-03-21: qty 236, 4d supply, fill #0

## 2021-03-21 MED ORDER — PANTOPRAZOLE SODIUM 40 MG PO TBEC
40.0000 mg | DELAYED_RELEASE_TABLET | Freq: Two times a day (BID) | ORAL | 1 refills | Status: DC
  Filled 2021-03-21: qty 60, 30d supply, fill #0

## 2021-03-21 NOTE — Progress Notes (Signed)
Mobility Specialist Progress Note:   03/21/21 1436  Mobility  Activity Ambulated in hall  Level of Assistance Contact guard assist, steadying assist  Assistive Device None  Distance Ambulated (ft) 350 ft  Mobility Ambulated with assistance in hallway  Mobility Response Tolerated well  Mobility performed by Mobility specialist  $Mobility charge 1 Mobility   Pt received in bed willing to participate in mobility. No complaints of pain. Pt requesting to use BSC. Pt left on BSC with call bell in reach and all needs met.     Mobility Specialist Primary Phone [phone] Secondary Phone [phone]  

## 2021-03-21 NOTE — TOC Progression Note (Signed)
Transition of Care [facility]) - Progression Note    Patient Details  Name: Kyle Pitts MRN: [id number] Date of Birth: [birth year]  Transition of [facility]) CM/SW Contact  Leone Haven, RN Phone Number: 03/21/2021, 3:15 PM  Clinical Narrative:    Meds sent to [facility] on Friday, to have filled for patient to be discharged on Saturday.      Barriers to Discharge: Continued Medical Work up, Inadequate or no insurance  Expected Discharge Plan and Services     Discharge Planning Services: CM Consult   Living arrangements for the past 2 months: Single Family Home Expected Discharge Date: 03/21/21                                     Social Determinants of Health (SDOH) Interventions    Readmission Risk Interventions No flowsheet data found.

## 2021-03-21 NOTE — Progress Notes (Signed)
PROGRESS NOTE    Kyle Pitts  [id number] DOB: 1961-02-13 DOA: 03/15/2021 PCP: Default, Provider, MD   Brief Narrative:  Kyle Pitts is a 60 y.o. male with medical history significant of alcoholic cirrhosis with esophageal varices and septic arthritis of right knee who presented to ED after being noted to be more confused by sister on the morning of admission.  He had just recently been hospitalized 10/26-10/30 with likely alcoholic cirrhosis.  Patient had undergone EGD on 10/29 noting grade 2 esophageal varices along with gastric and duodenal ulcers. since getting home he reports to continued use of Mobic although appears to have been recommended to discontinue NSAIDs during his last hospitalization.  Home in the AM with close GI follow up?  Assessment & Plan:   Principal Problem:   Hepatic encephalopathy Active Problems:   Hyponatremia   Acute renal failure (ARF) (HCC)   Elevated LFTs   Alcoholic cirrhosis of liver with ascites (HCC)   Gastric ulcer due to nonsteroidal anti-inflammatory drug (NSAID)   Duodenal ulcer   Severe protein-calorie malnutrition (HCC)   Thrombocytopenia (HCC)   Metabolic acidosis   Hepatorenal syndrome (HCC)   Hepatic encephalopathy secondary to decompensated alcoholic cirrhosis with ascites:   -ammonia elevated at 156 upon admission, improved to 56 on 03/16/2021.  J -  Had extensive work-up GI for which alcoholic hepatitis was thought to be less likely by GI.  Noted to have autoimmune markers-IgG elevated to 2342, normal ceruloplasmin hep B surface antigen alpha antitrypsin, antimitochondrial antibody ANA but borderline elevated ASMA..   -Underwent EGD evaluation 10/29 noted to have gastric and duodenal ulcers.  During his last hospitalization he was not noted to be a candidate for transplant given recent alcohol use. Patient's MELD score is 35 with a 52.6% estimated 98-month mortality.  GI consult:  - started on Rocephin and GI recommends  continuing for 7 days due to active GI bleeding.  Underwent diagnostic and therapeutic paracentesis with 4 L of fluid removed on 03/16/2021.  Labs so far not indicative of SBP.  Had another therapeutic paracentesis done on 03/18/2021 with a goal of 2.5 L.  He received albumin. - Will eventually need to start on Lasix/Aldactone when renal function allows.  Would not start at this juncture given active recovery from HRS.  When renal function improves enough, will start at Lasix 20 mg/Aldactone 50 mg and titrate   Nonoliguric acute renal failure with metabolic acidosis:  -likely HRS -Cr trending down  Hypokalemia: treated   Transaminitis and hyperbilirubinemia:  -outpatient GI follow up   anemia secondary to suspect GI bleed,Acute on chronic/gastric and duodenal ulcers:  - EGD completed 03/19/2021 showed bleeding gastric ulcers in the antrum, which were treated with 2x clips. This is suspected to be the source of the patient's hemoglobin drop. Patient has underlying GAVE, but this was not treated today because the gastric ulcers seemed to be the most likely etiology. He also still has large EV and PHG.  GI rec PPI PO BID for 8 weeks. Complete 7 day course of antibiotics with either ceftriaxone or ciprofloxacin for SBP prophylaxis in the setting of active GI bleed. Avoid NSAIDs/aspirin. Repeat upper endoscopy in 8 weeks to check healing.   Acute thrombocytopenia: -stable -no sign of bleeding   Hyponatremia:  -stable   Severe protein calorie malnutrition:  Nutrition Status: Nutrition Problem: Increased nutrient needs Etiology: chronic illness (cirrhosis) Signs/Symptoms: estimated needs Interventions: Ensure Enlive (each supplement provides 350kcal and 20 grams of protein), Snacks,  MVI   Dizziness -check orthos -ambulate q shift   DVT prophylaxis: SCDs Start: 03/15/21 1420   Code Status: Full Code  Family Communication:  sister on phone for extended period of time 12/1  Status is:  Inpatient  Remains inpatient appropriate because: home in the AM   Consultants:  Nephrology GI     Subjective: C/o dizziness after walking  Objective: Vitals:   03/20/21 1117 03/20/21 1954 03/21/21 0337 03/21/21 1146  BP: 106/70 119/82 126/78 100/74  Pulse: 68 71 68 70  Resp: 20 16 16 16   Temp: 99.1 F (37.3 C) 98.2 F (36.8 C) 98.5 F (36.9 C) 98.1 F (36.7 C)  TempSrc: Oral Oral Oral Oral  SpO2: 96% 99% 97% 98%  Weight:   112.4 kg   Height:        Intake/Output Summary (Last 24 hours) at 03/21/2021 1458 Last data filed at 03/21/2021 1307 Gross per 24 hour  Intake 2157 ml  Output 950 ml  Net 1207 ml   Filed Weights   03/19/21 0930 03/20/21 0328 03/21/21 0337  Weight: 108.4 kg 110.3 kg 112.4 kg    Examination:  Up walking in the hallway     Data Reviewed: I have personally reviewed following labs and imaging studies  CBC: Recent Labs  Lab 03/15/21 0958 03/15/21 1830 03/18/21 1612 03/19/21 0503 03/19/21 1637 03/20/21 0453 03/21/21 0012  WBC 10.0   < > 11.4* 12.3* 11.4* 10.7* 11.3*  NEUTROABS 7.1  --   --   --   --   --   --   HGB 7.9*   < > 7.9* 7.4* 8.3* 7.9* 8.0*  HCT 23.4*   < > 23.0* 20.8* 23.7* 23.0* 23.6*  MCV 106.4*   < > 98.7 97.7 99.6 99.1 99.6  PLT 129*   < > 83* 73* 77* 77* 80*   < > = values in this interval not displayed.   Basic Metabolic Panel: Recent Labs  Lab 03/16/21 0323 03/16/21 1037 03/17/21 0638 03/18/21 0015 03/19/21 0503 03/21/21 0012  NA 133*  --  133*  135 133* 135 133*  K 3.6  --  2.9*  2.9* 3.6 3.5 3.5  CL 104  --  104  106 107 108 107  CO2 19*  --  19*  21* 19* 19* 22  GLUCOSE 106*  --  88  90 109* 104* 116*  BUN 60*  --  55*  54* 47* 37* 25*  CREATININE 4.53*  --  3.44*  3.55* 3.05* 2.11* 1.69*  CALCIUM 8.6*  --  8.3*  8.5* 8.4* 8.6* 8.6*  MG  --  2.2  --   --   --   --   PHOS  --   --  5.1*  --   --   --    GFR: Estimated Creatinine Clearance: 61.7 mL/min (A) (by C-G formula based on SCr of  1.69 mg/dL (H)). Liver Function Tests: Recent Labs  Lab 03/15/21 0958 03/16/21 0323 03/17/21 0638 03/18/21 0015 03/19/21 0503  AST 140* 134* 87* 90* 80*  ALT 51* 42 29 28 24   ALKPHOS 221* 180* 112 112 93  BILITOT 11.9* 11.7* 10.1* 10.5* 9.0*  PROT 7.5 7.5 6.3* 6.7 6.1*  ALBUMIN 1.7* 2.4* 2.7*  2.7* 3.0* 3.2*   No results for input(s): LIPASE, AMYLASE in the last 168 hours. Recent Labs  Lab 03/15/21 0958 03/16/21 1037  AMMONIA 153* 56*   Coagulation Profile: Recent Labs  Lab 03/15/21 1239  INR  1.5*   Cardiac Enzymes: No results for input(s): CKTOTAL, CKMB, CKMBINDEX, TROPONINI in the last 168 hours. BNP (last 3 results) No results for input(s): PROBNP in the last 8760 hours. HbA1C: No results for input(s): HGBA1C in the last 72 hours. CBG: Recent Labs  Lab 03/15/21 1013  GLUCAP 82   Lipid Profile: No results for input(s): CHOL, HDL, LDLCALC, TRIG, CHOLHDL, LDLDIRECT in the last 72 hours. Thyroid Function Tests: No results for input(s): TSH, T4TOTAL, FREET4, T3FREE, THYROIDAB in the last 72 hours. Anemia Panel: No results for input(s): VITAMINB12, FOLATE, FERRITIN, TIBC, IRON, RETICCTPCT in the last 72 hours. Sepsis Labs: No results for input(s): PROCALCITON, LATICACIDVEN in the last 168 hours.  Recent Results (from the past 240 hour(s))  Resp Panel by RT-PCR (Flu A&B, Covid) Nasopharyngeal Swab     Status: None   Collection Time: 03/15/21 10:14 AM   Specimen: Nasopharyngeal Swab; Nasopharyngeal(NP) swabs in vial transport medium  Result Value Ref Range Status   SARS Coronavirus 2 by RT PCR NEGATIVE NEGATIVE Final    Comment: (NOTE) SARS-CoV-2 target nucleic acids are NOT DETECTED.  The SARS-CoV-2 RNA is generally detectable in upper respiratory specimens during the acute phase of infection. The lowest concentration of SARS-CoV-2 viral copies this assay can detect is 138 copies/mL. A negative result does not preclude SARS-Cov-2 infection and should not be  used as the sole basis for treatment or other patient management decisions. A negative result may occur with  improper specimen collection/handling, submission of specimen other than nasopharyngeal swab, presence of viral mutation(s) within the areas targeted by this assay, and inadequate number of viral copies(<138 copies/mL). A negative result must be combined with clinical observations, patient history, and epidemiological information. The expected result is Negative.  Fact Sheet for Patients:  BloggerCourse.com  Fact Sheet for Healthcare Providers:  SeriousBroker.it  This test is no t yet approved or cleared by the [place] FDA and  has been authorized for detection and/or diagnosis of SARS-CoV-2 by FDA under an Emergency Use Authorization (EUA). This EUA will remain  in effect (meaning this test can be used) for the duration of the COVID-19 declaration under Section 564(b)(1) of the Act, 21 U.S.C.section 360bbb-3(b)(1), unless the authorization is terminated  or revoked sooner.       Influenza A by PCR NEGATIVE NEGATIVE Final   Influenza B by PCR NEGATIVE NEGATIVE Final    Comment: (NOTE) The Xpert Xpress SARS-CoV-2/FLU/RSV plus assay is intended as an aid in the diagnosis of influenza from Nasopharyngeal swab specimens and should not be used as a sole basis for treatment. Nasal washings and aspirates are unacceptable for Xpert Xpress SARS-CoV-2/FLU/RSV testing.  Fact Sheet for Patients: BloggerCourse.com  Fact Sheet for Healthcare Providers: SeriousBroker.it  This test is not yet approved or cleared by the [place] FDA and has been authorized for detection and/or diagnosis of SARS-CoV-2 by FDA under an Emergency Use Authorization (EUA). This EUA will remain in effect (meaning this test can be used) for the duration of the COVID-19 declaration under Section  564(b)(1) of the Act, 21 U.S.C. section 360bbb-3(b)(1), unless the authorization is terminated or revoked.  Performed at [facility], [address], [place], [place] [address]   Gram stain     Status: None   Collection Time: 03/16/21 12:20 PM   Specimen: Fluid  Result Value Ref Range Status   Specimen Description FLUID  Final   Special Requests  PERITONEAL  Final   Gram Stain  Final    NO WBC SEEN NO ORGANISMS SEEN Performed at [facility], [place] [address], [place], [place] [address]    Report Status 03/17/2021 FINAL  Final  Culture, body fluid w Gram Stain-bottle     Status: None   Collection Time: 03/16/21 12:20 PM   Specimen: Fluid  Result Value Ref Range Status   Specimen Description FLUID  Final   Special Requests  PERITONEAL  Final   Culture   Final    NO GROWTH 5 DAYS Performed at [facility] [address] [place], [place] [address]    Report Status 03/21/2021 FINAL  Final       Radiology Studies: [place] ASCITES (ABDOMEN LIMITED)  Result Date: 03/21/2021 CLINICAL DATA:  60 year old male with ascites, status post paracentesis 03/18/2021. EXAM: LIMITED ABDOMEN ULTRASOUND FOR ASCITES TECHNIQUE: Limited ultrasound survey for ascites was performed in all four abdominal quadrants. COMPARISON:  CT Abdomen and Pelvis 03/15/2021. FINDINGS: Ascites, more pronounced in the right upper and lower quadrants than in the left abdomen. Volume less than that on 03/15/2021, and probably quantity insufficient for repeat paracentesis. IMPRESSION: Persistent ascites, but less than that demonstrated by CT on November 26th and probably quantity insufficient for repeat paracentesis. Electronically Signed   By: Genevie Ann M.D.   On: 03/21/2021 10:45    Scheduled Meds:  feeding supplement  237 mL Oral BID BM   lactulose  20 g Oral BID   midodrine  5 mg Oral TID WC   multivitamin with minerals  1 tablet Oral Daily   pantoprazole  40 mg Oral BID   rifaximin  550 mg Oral BID    sodium chloride flush  3 mL Intravenous Q12H   Continuous Infusions:     LOS: 6 days   Time spent: 30 minutes   Geradine Girt, DO [facility]  03/21/2021, 2:58 PM  Please page via [place] and do not message via secure chat for anything urgent. Secure chat can be used for anything non urgent.  How to contact the [facility] Attending or Consulting provider [place] or covering provider during after hours [place], for this patient?  Check the care team in [facility] and look for a) attending/consulting [facility] provider listed and b) the [facility] team listed. Page or secure chat [phone]. Log into [email or web address] and use [place]'s universal password to access. If you do not have the password, please contact the hospital operator. Locate the [facility] provider you are looking for under [facility] and page to a number that you can be directly reached. If you still have difficulty reaching the provider, please page the [facility] (Director on Call) for the Hospitalists listed on amion for assistance.

## 2021-03-22 NOTE — Discharge Summary (Signed)
Physician Discharge Summary  [name] [id number] DOB: 1961/01/05 DOA: 03/15/2021  PCP: Default, Provider, MD- patient scheduled a new visit  Admit date: 03/15/2021 Discharge date: 03/22/2021  Admitted From: home Discharge disposition: home   Recommendations for Outpatient Follow-Up:   Once BP better will need propanolol Also will need lasix/aldactone started once CR stable Close GI follow up High risk of returning to the hospital Repeat upper endoscopy in 8 weeks to check healing.  Protonix PO BID x 8 weeks   Discharge Diagnosis:   Principal Problem:   Hepatic encephalopathy Active Problems:   Hyponatremia   Acute renal failure (ARF) (HCC)   Elevated LFTs   Alcoholic cirrhosis of liver with ascites (HCC)   Gastric ulcer due to nonsteroidal anti-inflammatory drug (NSAID)   Duodenal ulcer   Severe protein-calorie malnutrition (HCC)   Thrombocytopenia (HCC)   Metabolic acidosis   Hepatorenal syndrome ([place])    Discharge Condition: Improved.  Diet recommendation: Low sodium, heart healthy..  Wound care: None.  Code status: Full.   History of Present Illness:   Kyle Pitts is a 60 y.o. male with medical history significant of alcoholic cirrhosis with esophageal varices and septic arthritis of right knee who presents after being noted to be more confused by sister this morning.  History is limited from patient as he is confused and his nephew who is present at bedside tries to provide additional history.  He had just recently been hospitalized 10/26-10/30 with likely alcoholic cirrhosis.  Patient had undergone EGD on 10/29 noting grade 2 esophageal varices along with gastric and duodenal ulcers.  Since getting home patient reports that since getting home he reports to continued use of Mobic although appears to have been recommended to discontinue NSAIDs during his last hospitalization.  Last drink was reported prior to previous hospitalization last  month.  He has reported dark stools.  His nephew reports that the patient did not urinate yesterday and felt like he could not urinate today, but has been unable to do so.  His sister noted that he was talking abnormal this morning for which she called EMS.   Hospital Course by Problem:   Hepatic encephalopathy secondary to decompensated alcoholic cirrhosis with ascites:   -ammonia elevated at 156 upon admission, improved to 56 on 03/16/2021.  J -  Had extensive work-up GI for which alcoholic hepatitis was thought to be less likely by GI.  Noted to have autoimmune markers-IgG elevated to 2342, normal ceruloplasmin hep B surface antigen alpha antitrypsin, antimitochondrial antibody ANA but borderline elevated ASMA..   -Underwent EGD evaluation 10/29 noted to have gastric and duodenal ulcers.  During his last hospitalization he was not noted to be a candidate for transplant given recent alcohol use. Patient's MELD score is 35 with a 52.6% estimated 45-month mortality.  GI consult:  - started on Rocephin and GI recommends continued for 7 days due to active GI bleeding.  Underwent diagnostic and therapeutic paracentesis with 4 L of fluid removed on 03/16/2021.  Labs so far not indicative of SBP.  Had another therapeutic paracentesis done on 03/18/2021 with a goal of 2.5 L.  He received albumin. - Will eventually need to start on Lasix/Aldactone when renal function allows.  Would not start at this juncture given active recovery from HRS.  When renal function improves enough, will start at Lasix 20 mg/Aldactone 50 mg and titrate  -12/2 he thought fluid was re-accumulating but U/S did not show enough ascites  for another paracentesis  Nonoliguric acute renal failure with metabolic acidosis:  -likely HRS -Cr trending down   Hypokalemia: treated   Transaminitis and hyperbilirubinemia:  -outpatient GI follow up   anemia secondary to suspect GI bleed,Acute on chronic/gastric and duodenal ulcers:  - EGD  completed 03/19/2021 showed bleeding gastric ulcers in the antrum, which were treated with 2x clips. This is suspected to be the source of the patient's hemoglobin drop. Patient has underlying GAVE, but this was not treated today because the gastric ulcers seemed to be the most likely etiology. He also still has large EV and PHG.  GI rec PPI PO BID for 8 weeks. Complete 7 day course of antibiotics with either ceftriaxone or ciprofloxacin for SBP prophylaxis in the setting of active GI bleed. Avoid NSAIDs/aspirin. Repeat upper endoscopy in 8 weeks to check healing.    Acute thrombocytopenia: -stable -no sign of bleeding   Hyponatremia:  -stable -PRN BMP   Severe protein calorie malnutrition:  Nutrition Status: Nutrition Problem: Increased nutrient needs Etiology: chronic illness (cirrhosis) Signs/Symptoms: estimated needs Interventions: Ensure Enlive (each supplement provides 350kcal and 20 grams of protein), Snacks, MVI   Dizziness -resolved    Medical Consultants:   GI    Discharge Exam:   Vitals:   03/21/21 1931 03/22/21 0336  BP: 114/69 124/70  Pulse: 63 72  Resp: 18 18  Temp: 98.7 F (37.1 C) 98.5 F (36.9 C)  SpO2: 96% 96%   Vitals:   03/21/21 1146 03/21/21 1500 03/21/21 1931 03/22/21 0336  BP: 100/74  114/69 124/70  Pulse: 70  63 72  Resp: 16  18 18   Temp: 98.1 F (36.7 C)  98.7 F (37.1 C) 98.5 F (36.9 C)  TempSrc: Oral  Oral Oral  SpO2: 98% 100% 96% 96%  Weight:    112.6 kg  Height:        General exam: Appears calm and comfortable.   The results of significant diagnostics from this hospitalization (including imaging, microbiology, ancillary and laboratory) are listed below for reference.     Procedures and Diagnostic Studies:   CT ABDOMEN PELVIS WO CONTRAST  Result Date: 03/15/2021 CLINICAL DATA:  Edema in the abdomen and extremities for 1 month. History of cirrhosis. Jaundice and abdominal pain. EXAM: CT ABDOMEN AND PELVIS WITHOUT CONTRAST  TECHNIQUE: Multidetector CT imaging of the abdomen and pelvis was performed following the standard protocol without IV contrast. COMPARISON:  Abdominopelvic CT 02/13/2021. FINDINGS: Lower chest: Images through the lung bases are mildly degraded by breathing artifact. There is mildly increased atelectasis in the right middle lobe. No significant pleural or pericardial effusion. Coronary artery atherosclerosis noted. Hepatobiliary: Again demonstrated are changes of advanced cirrhosis with diffuse contour irregularity of the liver and relative enlargement of the caudate and left lobes. No focal abnormalities are identified on noncontrast imaging. Compared with the recent study, the liver has decreased in size, and previously demonstrated diffuse steatosis as improved. Probable mild dependent sludge in the gallbladder lumen. No discrete gallstones, gallbladder wall thickening or biliary dilatation. Pancreas: Unremarkable. No pancreatic ductal dilatation or surrounding inflammatory changes. Spleen: Normal in size without focal abnormality. Adrenals/Urinary Tract: Both adrenal glands appear normal. Both kidneys appear unremarkable. No evidence of urinary tract calculus or hydronephrosis. The bladder appears normal. Stomach/Bowel: No enteric contrast administered. The stomach appears unremarkable for its degree of distention. Mild nonspecific right colonic wall thickening, likely related to cirrhosis and ascites. No evidence of bowel distension or surrounding focal inflammation. Vascular/Lymphatic: There are no enlarged  abdominal or pelvic lymph nodes. Mild aortic and branch vessel atherosclerosis. Reproductive: The prostate gland and seminal vesicles appear unremarkable. Other: Interval significant increase in volume of ascites, now moderate to large. In addition, there is generalized soft tissue edema throughout the subcutaneous and intra-fat. No focal extraluminal fluid or air collections are seen. Musculoskeletal: No  acute or significant osseous findings. Severe multilevel thoracolumbar spondylosis with multilevel disc and endplate degeneration, grossly unchanged. There is the potential for nerve root encroachment, especially within the L5-S1 foramina bilaterally. IMPRESSION: 1. Significant progression of ascites with generalized soft tissue edema consistent with anasarca, likely related to cirrhosis and hepatic dysfunction. 2. Over the last month, the liver has decreased in size with improved steatosis. 3. Mild nonspecific right colonic wall thickening, likely related to liver disease/cirrhosis. 4.  Aortic Atherosclerosis (ICD10-I70.0). Electronically Signed   By: Richardean Sale M.D.   On: 03/15/2021 12:31   DG Chest 2 View  Result Date: 03/15/2021 CLINICAL DATA:  Altered mental status. EXAM: CHEST - 2 VIEW COMPARISON:  10/07/2020 chest radiograph FINDINGS: Cardiomediastinal silhouette is unremarkable. This is a low volume study with mild bibasilar atelectasis. There is no evidence of focal airspace disease, pulmonary edema, suspicious pulmonary nodule/mass, pleural effusion, or pneumothorax. No acute bony abnormalities are identified. IMPRESSION: Low volume study with mild bibasilar atelectasis. Electronically Signed   By: Margarette Canada M.D.   On: 03/15/2021 12:13   US Paracentesis  Result Date: 03/16/2021 INDICATION: Abdominal distention. Ascites due to alcoholic cirrhosis. Request for diagnostic and therapeutic paracentesis up to 4 L max. EXAM: ULTRASOUND GUIDED RIGHT LOWER QUADRANT PARACENTESIS MEDICATIONS: 1% plain lidocaine, 5 mL COMPLICATIONS: None immediate. PROCEDURE: Informed written consent was obtained from the patient after a discussion of the risks, benefits and alternatives to treatment. A timeout was performed prior to the initiation of the procedure. Initial ultrasound scanning demonstrates a large amount of ascites within the right lower abdominal quadrant. The right lower abdomen was prepped and  draped in the usual sterile fashion. 1% lidocaine was used for local anesthesia. Following this, a 19 gauge, 7-cm, Yueh catheter was introduced. An ultrasound image was saved for documentation purposes. The paracentesis was performed. The catheter was removed and a dressing was applied. The patient tolerated the procedure well without immediate post procedural complication. FINDINGS: A total of approximately 4 L of clear yellow fluid was removed. Samples were sent to the laboratory as requested by the clinical team. IMPRESSION: Successful ultrasound-guided paracentesis yielding 4 liters of peritoneal fluid. Read by: Ascencion Dike PA-C Electronically Signed   By: Sandi Mariscal M.D.   On: 03/16/2021 13:21   VAS [place] LOWER EXTREMITY VENOUS (DVT) (7a-7p)  Result Date: 03/17/2021  Lower Venous DVT Study Patient Name:  TAIMUR FEICHT  Date of Exam:   03/16/2021 Medical Rec #: [id number]        Accession #:    [id number] Date of Birth: [birth year]        Patient Gender: M Patient Age:   3 years Exam Location:  [facility] Procedure:      VAS [place] LOWER EXTREMITY VENOUS (DVT) Referring Phys: SOPHIA CACCAVALE --------------------------------------------------------------------------------  Indications: Edema (bilateral pitting). Other Indications: Cirrhosis, anasarca. Comparison Study: Previous exam 10/07/2020 was negative for DVT in LLE Performing Technologist: Rogelia Rohrer RVT, RDMS  Examination Guidelines: A complete evaluation includes B-mode imaging, spectral Doppler, color Doppler, and power Doppler as needed of all accessible portions of each vessel. Bilateral testing is considered an integral part of a complete examination. Limited  examinations for reoccurring indications may be performed as noted. The reflux portion of the exam is performed with the patient in reverse Trendelenburg.  +---------+---------------+---------+-----------+----------+--------------+ RIGHT     CompressibilityPhasicitySpontaneityPropertiesThrombus Aging +---------+---------------+---------+-----------+----------+--------------+ CFV      Full           Yes      Yes                                 +---------+---------------+---------+-----------+----------+--------------+ SFJ      Full                                                        +---------+---------------+---------+-----------+----------+--------------+ FV Prox  Full           Yes      Yes                                 +---------+---------------+---------+-----------+----------+--------------+ FV Mid   Full           Yes      Yes                                 +---------+---------------+---------+-----------+----------+--------------+ FV DistalFull           Yes      Yes                                 +---------+---------------+---------+-----------+----------+--------------+ PFV      Full                                                        +---------+---------------+---------+-----------+----------+--------------+ POP      Full           Yes      Yes                                 +---------+---------------+---------+-----------+----------+--------------+ PTV      Full                                                        +---------+---------------+---------+-----------+----------+--------------+ PERO     Full                                                        +---------+---------------+---------+-----------+----------+--------------+   +---------+---------------+---------+-----------+----------+--------------+ LEFT     CompressibilityPhasicitySpontaneityPropertiesThrombus Aging +---------+---------------+---------+-----------+----------+--------------+ CFV      Full           Yes      Yes                                 +---------+---------------+---------+-----------+----------+--------------+  SFJ      Full                                                         +---------+---------------+---------+-----------+----------+--------------+ FV Prox  Full           Yes      Yes                                 +---------+---------------+---------+-----------+----------+--------------+ FV Mid   Full           Yes      Yes                                 +---------+---------------+---------+-----------+----------+--------------+ FV DistalFull           Yes      Yes                                 +---------+---------------+---------+-----------+----------+--------------+ PFV      Full                                                        +---------+---------------+---------+-----------+----------+--------------+ POP      Full           Yes      Yes                                 +---------+---------------+---------+-----------+----------+--------------+ PTV      Full                                                        +---------+---------------+---------+-----------+----------+--------------+ PERO     Full                                                        +---------+---------------+---------+-----------+----------+--------------+     Summary: BILATERAL: - No evidence of deep vein thrombosis seen in the lower extremities, bilaterally. - No evidence of superficial venous thrombosis in the lower extremities, bilaterally. -No evidence of popliteal cyst, bilaterally. RIGHT: - Ultrasound characteristics of enlarged lymph nodes are noted in the groin. Diffuse subcutaneous edema noted throughout.  LEFT: - Ultrasound characteristics of enlarged lymph nodes noted in the groin. Diffuse subcutaneous edema noted throughout.  *See table(s) above for measurements and observations. Electronically signed by Orlie Pollen on 03/17/2021 at 10:21:06 AM.    Final      Labs:   Basic Metabolic Panel: Recent Labs  Lab 03/16/21 0323 03/16/21 1037 03/17/21 [id number] 03/18/21 0015 03/19/21 0503 03/21/21 0012  NA 133*  --   133*  135 133* 135 133*  K 3.6  --  2.9*  2.9* 3.6 3.5 3.5  CL 104  --  104  106 107 108 107  CO2 19*  --  19*  21* 19* 19* 22  GLUCOSE 106*  --  88  90 109* 104* 116*  BUN 60*  --  55*  54* 47* 37* 25*  CREATININE 4.53*  --  3.44*  3.55* 3.05* 2.11* 1.69*  CALCIUM 8.6*  --  8.3*  8.5* 8.4* 8.6* 8.6*  MG  --  2.2  --   --   --   --   PHOS  --   --  5.1*  --   --   --    GFR Estimated Creatinine Clearance: 61.7 mL/min (A) (by C-G formula based on SCr of 1.69 mg/dL (H)). Liver Function Tests: Recent Labs  Lab 03/15/21 0958 03/16/21 0323 03/17/21 0638 03/18/21 0015 03/19/21 0503  AST 140* 134* 87* 90* 80*  ALT 51* 42 29 28 24   ALKPHOS 221* 180* 112 112 93  BILITOT 11.9* 11.7* 10.1* 10.5* 9.0*  PROT 7.5 7.5 6.3* 6.7 6.1*  ALBUMIN 1.7* 2.4* 2.7*  2.7* 3.0* 3.2*   No results for input(s): LIPASE, AMYLASE in the last 168 hours. Recent Labs  Lab 03/15/21 0958 03/16/21 1037  AMMONIA 153* 56*   Coagulation profile Recent Labs  Lab 03/15/21 1239  INR 1.5*    CBC: Recent Labs  Lab 03/15/21 0958 03/15/21 1830 03/18/21 1612 03/19/21 0503 03/19/21 1637 03/20/21 0453 03/21/21 0012  WBC 10.0   < > 11.4* 12.3* 11.4* 10.7* 11.3*  NEUTROABS 7.1  --   --   --   --   --   --   HGB 7.9*   < > 7.9* 7.4* 8.3* 7.9* 8.0*  HCT 23.4*   < > 23.0* 20.8* 23.7* 23.0* 23.6*  MCV 106.4*   < > 98.7 97.7 99.6 99.1 99.6  PLT 129*   < > 83* 73* 77* 77* 80*   < > = values in this interval not displayed.   Cardiac Enzymes: No results for input(s): CKTOTAL, CKMB, CKMBINDEX, TROPONINI in the last 168 hours. BNP: Invalid input(s): POCBNP CBG: Recent Labs  Lab 03/15/21 1013  GLUCAP 82   D-Dimer No results for input(s): DDIMER in the last 72 hours. Hgb A1c No results for input(s): HGBA1C in the last 72 hours. Lipid Profile No results for input(s): CHOL, HDL, LDLCALC, TRIG, CHOLHDL, LDLDIRECT in the last 72 hours. Thyroid function studies No results for input(s): TSH, T4TOTAL,  T3FREE, THYROIDAB in the last 72 hours.  Invalid input(s): FREET3 Anemia work up No results for input(s): VITAMINB12, FOLATE, FERRITIN, TIBC, IRON, RETICCTPCT in the last 72 hours. Microbiology Recent Results (from the past 240 hour(s))  Resp Panel by RT-PCR (Flu A&B, Covid) Nasopharyngeal Swab     Status: None   Collection Time: 03/15/21 10:14 AM   Specimen: Nasopharyngeal Swab; Nasopharyngeal(NP) swabs in vial transport medium  Result Value Ref Range Status   SARS Coronavirus 2 by RT PCR NEGATIVE NEGATIVE Final    Comment: (NOTE) SARS-CoV-2 target nucleic acids are NOT DETECTED.  The SARS-CoV-2 RNA is generally detectable in upper respiratory specimens during the acute phase of infection. The lowest concentration of SARS-CoV-2 viral copies this assay can detect is 138 copies/mL. A negative result does not preclude SARS-Cov-2 infection and should not be used as the sole basis for treatment or other patient management decisions. A negative result may occur with  improper specimen collection/handling, submission of specimen other than nasopharyngeal swab, presence of viral mutation(s) within the areas targeted by this assay, and inadequate number of viral copies(<138 copies/mL). A negative result must be combined with clinical observations, patient history, and epidemiological information. The expected result is Negative.  Fact Sheet for Patients:  EntrepreneurPulse.com.au  Fact Sheet for Healthcare Providers:  IncredibleEmployment.be  This test is no t yet approved or cleared by the [place] FDA and  has been authorized for detection and/or diagnosis of SARS-CoV-2 by FDA under an Emergency Use Authorization (EUA). This EUA will remain  in effect (meaning this test can be used) for the duration of the COVID-19 declaration under Section 564(b)(1) of the Act, 21 U.S.C.section 360bbb-3(b)(1), unless the authorization is terminated  or  revoked sooner.       Influenza A by PCR NEGATIVE NEGATIVE Final   Influenza B by PCR NEGATIVE NEGATIVE Final    Comment: (NOTE) The Xpert Xpress SARS-CoV-2/FLU/RSV plus assay is intended as an aid in the diagnosis of influenza from Nasopharyngeal swab specimens and should not be used as a sole basis for treatment. Nasal washings and aspirates are unacceptable for Xpert Xpress SARS-CoV-2/FLU/RSV testing.  Fact Sheet for Patients: EntrepreneurPulse.com.au  Fact Sheet for Healthcare Providers: IncredibleEmployment.be  This test is not yet approved or cleared by the [place] FDA and has been authorized for detection and/or diagnosis of SARS-CoV-2 by FDA under an Emergency Use Authorization (EUA). This EUA will remain in effect (meaning this test can be used) for the duration of the COVID-19 declaration under Section 564(b)(1) of the Act, 21 U.S.C. section 360bbb-3(b)(1), unless the authorization is terminated or revoked.  Performed at [facility], [place] [address] [place], [place] [address]   Gram stain     Status: None   Collection Time: 03/16/21 12:20 PM   Specimen: Fluid  Result Value Ref Range Status   Specimen Description FLUID  Final   Special Requests  PERITONEAL  Final   Gram Stain   Final    NO WBC SEEN NO ORGANISMS SEEN Performed at [facility], [place] [address], [place], [place] [address]    Report Status 03/17/2021 FINAL  Final  Culture, body fluid w Gram Stain-bottle     Status: None   Collection Time: 03/16/21 12:20 PM   Specimen: Fluid  Result Value Ref Range Status   Specimen Description FLUID  Final   Special Requests  PERITONEAL  Final   Culture   Final    NO GROWTH 5 DAYS Performed at [place] [address], [place], [place] [address]    Report Status 03/21/2021 FINAL  Final     Discharge Instructions:   Discharge Instructions     Diet - low sodium heart healthy   Complete by: As  directed    Increase activity slowly   Complete by: As directed       Allergies as of 03/22/2021       Reactions   Amoxicillin Other (See Comments)   Itching around eyes and scalp on amoxicilin   Cephalexin Nausea Only   Upset stomach Ceftriaxone is ok   Penicillins Rash        Medication List     STOP taking these medications    folic acid 1 MG tablet Commonly known as: FOLVITE   meloxicam 7.5 MG tablet Commonly known as: MOBIC   propranolol 20 MG tablet Commonly known as: INDERAL   thiamine 100 MG  tablet       TAKE these medications    lactulose (encephalopathy) 10 GM/15ML Soln Commonly known as: CHRONULAC Take 30 mLs (20 g total) by mouth 2 (two) times daily.   midodrine 5 MG tablet Commonly known as: PROAMATINE Take 1 tablet (5 mg total) by mouth 3 (three) times daily with meals.   multivitamin tablet Take 1 tablet by mouth daily.   pantoprazole 40 MG tablet Commonly known as: PROTONIX Take 1 tablet (40 mg total) by mouth 2 (two) times daily.          Time coordinating discharge: 35 min  Signed:  Joseph Art DO  [facility] 03/22/2021, 9:14 AM

## 2021-03-22 NOTE — Discharge Instructions (Signed)
BMp 1 week

## 2021-03-22 NOTE — TOC Transition Note (Signed)
Transition of Care [facility]) - CM/SW Discharge Note   Patient Details  Name: ALANDO COLLERAN MRN: [id number] Date of Birth: 07/02/1960  Transition of Care [facility] CM/SW Contact:  Leone Haven, RN Phone Number: 03/22/2021, 9:36 AM   Clinical Narrative:    Patient is for dc today, TOC has filled his medications for him.  He will have transport home.    Final next level of care: Home/Self Care Barriers to Discharge: No Barriers Identified   Patient Goals and CMS Choice Patient states their goals for this hospitalization and ongoing recovery are:: return home   Choice offered to / list presented to : NA  Discharge Placement                       Discharge Plan and Services In-house Referral: NA Discharge Planning Services: CM Consult Post Acute Care Choice: NA            DME Agency: NA       HH Arranged: NA          Social Determinants of Health (SDOH) Interventions     Readmission Risk Interventions Readmission Risk Prevention Plan 03/22/2021  Transportation Screening Complete  PCP or Specialist Appt within 3-5 Days Complete  HRI or Home Care Consult Complete  Social Work Consult for Recovery Care Planning/Counseling Complete  Palliative Care Screening Not Applicable  Medication Review Oceanographer) Complete  Some recent data might be hidden

## 2021-03-22 NOTE — TOC Initial Note (Signed)
Transition of Care [facility]) - Initial/Assessment Note    Patient Details  Name: Kyle Pitts MRN: [id number] Date of Birth: 29-Aug-1960  [facility]) CM/SW Contact:    Leone Haven, RN Phone Number: 03/22/2021, 9:35 AM  Clinical Narrative:                 Patient is for dc today, TOC has filled his medications for him.  He will have transport home.   Expected Discharge Plan: Home/Self Care Barriers to Discharge: No Barriers Identified   Patient Goals and CMS Choice Patient states their goals for this hospitalization and ongoing recovery are:: return home   Choice offered to / list presented to : NA  Expected Discharge Plan and Services Expected Discharge Plan: Home/Self Care In-house Referral: NA Discharge Planning Services: CM Consult Post Acute Care Choice: NA Living arrangements for the past 2 months: Single Family Home Expected Discharge Date: 03/22/21                 DME Agency: NA       HH Arranged: NA          Prior Living Arrangements/Services Living arrangements for the past 2 months: Single Family Home Lives with:: Relatives Patient language and need for interpreter reviewed:: Yes Do you feel safe going back to the place where you live?: Yes      Need for Family Participation in Patient Care: Yes (Comment) Care giver support system in place?: Yes (comment) Current home services:  (cane, walker) Criminal Activity/Legal Involvement Pertinent to Current Situation/Hospitalization: No - Comment as needed  Activities of Daily Living   ADL Screening (condition at time of admission) Patient's cognitive ability adequate to safely complete daily activities?: Yes Is the patient deaf or have difficulty hearing?: No Does the patient have difficulty seeing, even when wearing glasses/contacts?: No Does the patient have difficulty concentrating, remembering, or making decisions?: Yes Patient able to express need for assistance with ADLs?:  Yes Does the patient have difficulty dressing or bathing?: Yes Independently performs ADLs?: No Does the patient have difficulty walking or climbing stairs?: Yes  Permission Sought/Granted                  Emotional Assessment Appearance:: Appears stated age Attitude/Demeanor/Rapport: Engaged Affect (typically observed): Appropriate Orientation: : Oriented to Self, Oriented to Place, Oriented to  Time, Oriented to Situation Alcohol / Substance Use: Not Applicable Psych Involvement: No (comment)  Admission diagnosis:  Hepatic encephalopathy [K76.82] Alcoholic cirrhosis of liver with ascites (HCC) [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] AKI (acute kidney injury) (HCC) [N17.9] Ascites [R18.8] Patient Active Problem List   Diagnosis Date Noted   Hepatorenal syndrome (HCC)    Metabolic acidosis 03/16/2021   Hepatic encephalopathy 03/15/2021   Severe protein-calorie malnutrition (HCC) 03/15/2021   Thrombocytopenia (HCC) 03/15/2021   Alcoholic cirrhosis of liver with ascites (HCC)    Esophageal varices in alcoholic cirrhosis (HCC)    Gastric ulcer due to nonsteroidal anti-inflammatory drug (NSAID)    Duodenal ulcer    Portal hypertensive gastropathy (HCC)    Acute blood loss anemia    Acute renal failure (ARF) (HCC) 02/13/2021   Elevated LFTs 02/13/2021   Allergic reaction 11/21/2020   Osteomyelitis of right leg (HCC) 11/21/2020   Streptococcus infection, group A 11/21/2020   Myofasciitis 11/21/2020   PICC line infection 11/08/2020   Anemia 11/08/2020   Decompensated hepatic cirrhosis (HCC) 10/07/2020   Hyponatremia 10/07/2020   Leucocytosis 10/07/2020   Cellulitis and abscess of right leg  10/07/2020   Septic arthritis of knee (HCC) 09/28/2020   ALLERGIC RHINITIS 01/13/2007   ASTHMA 01/13/2007   PCP:  Default, Provider, MD Pharmacy:   [facility] [phone] - [place], [place] - [address] AT [facility] [address]  [place] [address] Phone: [phone] Fax: [phone]  [facility] [address] [place] [address] Phone: [phone] Fax: [phone]     Social Determinants of Health (SDOH) Interventions    Readmission Risk Interventions Readmission Risk Prevention Plan 03/22/2021  Transportation Screening Complete  PCP or Specialist Appt within 3-5 Days Complete  HRI or Home Care Consult Complete  Social Work Consult for Recovery Care Planning/Counseling Complete  Palliative Care Screening Not Applicable  Medication Review Oceanographer) Complete  Some recent data might be hidden

## 2021-03-27 ENCOUNTER — Other Ambulatory Visit (INDEPENDENT_AMBULATORY_CARE_PROVIDER_SITE_OTHER): Payer: Medicaid Other

## 2021-03-27 DIAGNOSIS — K7290 Hepatic failure, unspecified without coma: Secondary | ICD-10-CM

## 2021-03-27 DIAGNOSIS — K7460 Unspecified cirrhosis of liver: Secondary | ICD-10-CM | POA: Diagnosis not present

## 2021-03-27 LAB — COMPREHENSIVE METABOLIC PANEL
ALT: 21 U/L (ref 0–53)
AST: 64 U/L — ABNORMAL HIGH (ref 0–37)
Albumin: 3.2 g/dL — ABNORMAL LOW (ref 3.5–5.2)
Alkaline Phosphatase: 147 U/L — ABNORMAL HIGH (ref 39–117)
BUN: 16 mg/dL (ref 6–23)
CO2: 23 mEq/L (ref 19–32)
Calcium: 9.1 mg/dL (ref 8.4–10.5)
Chloride: 105 mEq/L (ref 96–112)
Creatinine, Ser: 1.27 mg/dL (ref 0.40–1.50)
GFR: 62.06 mL/min (ref >60.00)
Glucose, Bld: 97 mg/dL (ref 70–99)
Potassium: 3.9 mEq/L (ref 3.5–5.1)
Sodium: 134 mEq/L — ABNORMAL LOW (ref 135–145)
Total Bilirubin: 8.1 mg/dL — ABNORMAL HIGH (ref 0.2–1.2)
Total Protein: 6.9 g/dL (ref 6.0–8.3)

## 2021-03-28 ENCOUNTER — Encounter (HOSPITAL_COMMUNITY): Payer: Self-pay | Admitting: Emergency Medicine

## 2021-03-28 ENCOUNTER — Emergency Department (HOSPITAL_COMMUNITY)
Admission: EM | Admit: 2021-03-28 | Discharge: 2021-03-29 | Disposition: A | Discharge status: Home / Self Care | Payer: Medicaid Other | Attending: Emergency Medicine | Admitting: Emergency Medicine

## 2021-03-28 ENCOUNTER — Emergency Department (HOSPITAL_COMMUNITY): Payer: Medicaid Other

## 2021-03-28 DIAGNOSIS — R188 Other ascites: Secondary | ICD-10-CM | POA: Insufficient documentation

## 2021-03-28 DIAGNOSIS — J45909 Unspecified asthma, uncomplicated: Secondary | ICD-10-CM | POA: Diagnosis not present

## 2021-03-28 DIAGNOSIS — R0602 Shortness of breath: Secondary | ICD-10-CM | POA: Insufficient documentation

## 2021-03-28 DIAGNOSIS — K7031 Alcoholic cirrhosis of liver with ascites: Secondary | ICD-10-CM

## 2021-03-28 LAB — CBC WITH DIFFERENTIAL/PLATELET
Abs Immature Granulocytes: 0.02 10*3/uL (ref 0.00–0.07)
Basophils Absolute: 0 10*3/uL (ref 0.0–0.1)
Basophils Absolute: 0.1 10*3/uL (ref 0.0–0.1)
Basophils Relative: 0.9 % (ref 0.0–3.0)
Basophils Relative: 1 %
Eosinophils Absolute: 0.2 10*3/uL (ref 0.0–0.7)
Eosinophils Absolute: 0.3 10*3/uL (ref 0.0–0.5)
Eosinophils Relative: 4.1 % (ref 0.0–5.0)
Eosinophils Relative: 4 %
HCT: 24.6 % — ABNORMAL LOW (ref 39.0–52.0)
HCT: 25.3 % — ABNORMAL LOW (ref 39.0–52.0)
Hemoglobin: 8.5 g/dL — ABNORMAL LOW (ref 13.0–17.0)
Hemoglobin: 8.5 g/dL — ABNORMAL LOW (ref 13.0–17.0)
Immature Granulocytes: 0 %
Lymphocytes Relative: 20.3 % (ref 12.0–46.0)
Lymphocytes Relative: 19 %
Lymphs Abs: 1.2 10*3/uL (ref 0.7–4.0)
Lymphs Abs: 1.4 10*3/uL (ref 0.7–4.0)
MCH: 34.4 pg — ABNORMAL HIGH (ref 26.0–34.0)
MCHC: 34.4 g/dL (ref 30.0–36.0)
MCHC: 33.6 g/dL (ref 30.0–36.0)
MCV: 102.2 fl — ABNORMAL HIGH (ref 78.0–100.0)
MCV: 102.4 fL — ABNORMAL HIGH (ref 80.0–100.0)
Monocytes Absolute: 0.7 10*3/uL (ref 0.1–1.0)
Monocytes Absolute: 1 10*3/uL (ref 0.1–1.0)
Monocytes Relative: 11.4 % (ref 3.0–12.0)
Monocytes Relative: 13 %
Neutro Abs: 3.6 10*3/uL (ref 1.4–7.7)
Neutro Abs: 4.5 10*3/uL (ref 1.7–7.7)
Neutrophils Relative %: 63.3 % (ref 43.0–77.0)
Neutrophils Relative %: 63 %
Platelets: 167 10*3/uL (ref 150.0–400.0)
Platelets: 170 10*3/uL (ref 150–400)
RBC: 2.4 Mil/uL — ABNORMAL LOW (ref 4.22–5.81)
RBC: 2.47 MIL/uL — ABNORMAL LOW (ref 4.22–5.81)
RDW: 18.5 % — ABNORMAL HIGH (ref 11.5–15.5)
RDW: 18.7 % — ABNORMAL HIGH (ref 11.5–15.5)
WBC: 5.7 10*3/uL (ref 4.0–10.5)
WBC: 7.2 10*3/uL (ref 4.0–10.5)
nRBC: 0 % (ref 0.0–0.2)

## 2021-03-28 LAB — COMPREHENSIVE METABOLIC PANEL
ALT: 26 U/L (ref 0–44)
AST: 75 U/L — ABNORMAL HIGH (ref 15–41)
Albumin: 2.9 g/dL — ABNORMAL LOW (ref 3.5–5.0)
Alkaline Phosphatase: 136 U/L — ABNORMAL HIGH (ref 38–126)
Anion gap: 7 (ref 5–15)
BUN: 13 mg/dL (ref 6–20)
CO2: 21 mmol/L — ABNORMAL LOW (ref 22–32)
Calcium: 8.9 mg/dL (ref 8.9–10.3)
Chloride: 106 mmol/L (ref 98–111)
Creatinine, Ser: 1.39 mg/dL — ABNORMAL HIGH (ref 0.61–1.24)
GFR, Estimated: 58 mL/min — ABNORMAL LOW (ref >60)
Glucose, Bld: 97 mg/dL (ref 70–99)
Potassium: 3.8 mmol/L (ref 3.5–5.1)
Sodium: 134 mmol/L — ABNORMAL LOW (ref 135–145)
Total Bilirubin: 8.2 mg/dL — ABNORMAL HIGH (ref 0.3–1.2)
Total Protein: 7.3 g/dL (ref 6.5–8.1)

## 2021-03-28 LAB — BRAIN NATRIURETIC PEPTIDE: B Natriuretic Peptide: 184.4 pg/mL — ABNORMAL HIGH (ref 0.0–100.0)

## 2021-03-28 LAB — LIPASE, BLOOD: Lipase: 63 U/L — ABNORMAL HIGH (ref 11–51)

## 2021-03-28 LAB — APTT: aPTT: 42 seconds — ABNORMAL HIGH (ref 24–36)

## 2021-03-28 LAB — AMMONIA: Ammonia: 41 umol/L — ABNORMAL HIGH (ref 9–35)

## 2021-03-28 LAB — PROTIME-INR
INR: 1.7 — ABNORMAL HIGH (ref 0.8–1.2)
Prothrombin Time: 19.6 seconds — ABNORMAL HIGH (ref 11.4–15.2)

## 2021-03-28 NOTE — ED Provider Notes (Signed)
Emergency Medicine Provider Triage Evaluation Note  Kyle Pitts , a 60 y.o. male  was evaluated in triage.  Pt complains of abdominal distention/tightness and SOB worsening over the last couple of days. H/o liver cirrhosis and ascites with 2L taken off last week.   Review of Systems  Positive: Abdominal distension, SOB  Negative: Chest pain, N/V/D  Physical Exam  BP 120/69   Pulse 75   Temp 98.2 F (36.8 C) (Oral)   Resp 18   SpO2 100%  Gen:   Awake, no distress, scleral icterus Resp:  Normal effort. Speaking in full sentences MSK:   Moves extremities without difficulty  Other:  Distended, tympanous abdomen. Non tender. Edema noted to bilateral lower extremities.  Medical Decision Making  Medically screening exam initiated at 5:44 PM.  Appropriate orders placed.  Kyle Pitts was informed that the remainder of the evaluation will be completed by another provider, this initial triage assessment does not replace that evaluation, and the importance of remaining in the ED until their evaluation is complete.  Labs placed   Kyle Pitts, [place] 03/28/21 1753    Pitts, Kyle Brim, MD 03/28/21 [phone]

## 2021-03-28 NOTE — ED Triage Notes (Signed)
Patient with history of ascites and paracentesis last week here with complaint of shortness of breath after return of abdominal distension. Patient alert, oriented, speaking in complete sentences, and expresses he believes he needs another paracentesis.

## 2021-03-29 ENCOUNTER — Emergency Department (HOSPITAL_COMMUNITY): Payer: Medicaid Other

## 2021-03-29 MED ORDER — LIDOCAINE HCL (PF) 1 % IJ SOLN
INTRAMUSCULAR | Status: AC
  Filled 2021-03-29: qty 30

## 2021-03-29 NOTE — Procedures (Signed)
Ultrasound-guided  therapeutic paracentesis performed yielding 6.1 liters of golden yellow fluid. No immediate complications. EBL < 2 cc.

## 2021-03-29 NOTE — ED Provider Notes (Signed)
[facility] EMERGENCY DEPARTMENT Provider Note   CSN: [id number] Arrival date & time: 03/28/21  1626     History Chief Complaint  Patient presents with   Ascites    Kyle Pitts is a 60 y.o. male.  The history is provided by the patient.  Abdominal Pain Pain location:  Generalized Pain quality: bloating   Pain radiates to:  Does not radiate Pain severity:  Moderate Onset quality:  Gradual Timing:  Constant Progression:  Worsening Chronicity:  Recurrent Relieved by:  Nothing Worsened by:  Position changes Associated symptoms: shortness of breath   Associated symptoms: no chest pain, no fever and no vomiting   Patient with extensive history including cirrhosis presents with abdominal pain and distention.  Patient reports he feels like he needs a paracentesis.  He was recently in the hospital and required 1 while inpatient He reports mild shortness of breath but this is likely due to the distention.  He also reports lower extremity edema.  No fevers or vomiting.  He reports medication compliance    Past Medical History:  Diagnosis Date   Allergic reaction 11/21/2020   Cirrhosis (HCC)    Hyponatremia    Myofasciitis 11/21/2020   Osteomyelitis of right leg (HCC) 11/21/2020   Septic arthritis (HCC)    Streptococcus infection, group A 11/21/2020    Patient Active Problem List   Diagnosis Date Noted   Hepatorenal syndrome (HCC)    Metabolic acidosis 03/16/2021   Hepatic encephalopathy 03/15/2021   Severe protein-calorie malnutrition (HCC) 03/15/2021   Thrombocytopenia (HCC) 03/15/2021   Alcoholic cirrhosis of liver with ascites (HCC)    Esophageal varices in alcoholic cirrhosis (HCC)    Gastric ulcer due to nonsteroidal anti-inflammatory drug (NSAID)    Duodenal ulcer    Portal hypertensive gastropathy (HCC)    Acute blood loss anemia    Acute renal failure (ARF) (HCC) 02/13/2021   Elevated LFTs 02/13/2021   Allergic reaction 11/21/2020    Osteomyelitis of right leg (HCC) 11/21/2020   Streptococcus infection, group A 11/21/2020   Myofasciitis 11/21/2020   PICC line infection 11/08/2020   Anemia 11/08/2020   Decompensated hepatic cirrhosis (HCC) 10/07/2020   Hyponatremia 10/07/2020   Leucocytosis 10/07/2020   Cellulitis and abscess of right leg 10/07/2020   Septic arthritis of knee (HCC) 09/28/2020   ALLERGIC RHINITIS 01/13/2007   ASTHMA 01/13/2007    Past Surgical History:  Procedure Laterality Date   BIOPSY  02/15/2021   Procedure: BIOPSY;  Surgeon: Jenel Lucks, MD;  Location: [facility] ENDOSCOPY;  Service: Gastroenterology;;   ESOPHAGOGASTRODUODENOSCOPY N/A 02/15/2021   Procedure: ESOPHAGOGASTRODUODENOSCOPY (EGD);  Surgeon: Jenel Lucks, MD;  Location: [facility] ENDOSCOPY;  Service: Gastroenterology;  Laterality: N/A;   ESOPHAGOGASTRODUODENOSCOPY (EGD) WITH PROPOFOL N/A 03/19/2021   Procedure: ESOPHAGOGASTRODUODENOSCOPY (EGD) WITH PROPOFOL;  Surgeon: Imogene Burn, MD;  Location: [facility] ENDOSCOPY;  Service: Gastroenterology;  Laterality: N/A;   HEMOSTASIS CLIP PLACEMENT  03/19/2021   Procedure: HEMOSTASIS CLIP PLACEMENT;  Surgeon: Imogene Burn, MD;  Location: [facility] ENDOSCOPY;  Service: Gastroenterology;;   I & D EXTREMITY Right 10/09/2020   Procedure: IRRIGATION AND DEBRIDEMENT EXTREMITY;  Surgeon: Yolonda Kida, MD;  Location: [facility] ORS;  Service: Orthopedics;  Laterality: Right;   IR PARACENTESIS  03/18/2021   KNEE ARTHROSCOPY Right 09/27/2020   Procedure: ARTHROSCOPY KNEE, WASH OUT;  Surgeon: Venita Lick, MD;  Location: [facility] ORS;  Service: Orthopedics;  Laterality: Right;   KNEE ARTHROSCOPY Right 10/09/2020   Procedure: ARTHROSCOPY KNEE,ARTHROSCOPIC I &  D, CALF ABSCESS;  Surgeon: Yolonda Kida, MD;  Location: [facility] ORS;  Service: Orthopedics;  Laterality: Right;       Family History  Problem Relation Age of Onset   Hepatitis Mother    Cirrhosis Mother    Stroke Father    Heart failure Father    Kidney  failure Brother     Social History   Tobacco Use   Smoking status: Never   Smokeless tobacco: Never  Vaping Use   Vaping Use: Never used  Substance Use Topics   Alcohol use: Yes    Alcohol/week: 24.0 standard drinks    Types: 24 Cans of beer per week    Comment: 3-4 when home from work   Drug use: Not Currently    Types: Cocaine, Marijuana    Comment: last used 1 month ago    Home Medications Prior to Admission medications   Medication Sig Start Date End Date Taking? Authorizing Provider  lactulose, encephalopathy, (CHRONULAC) 10 GM/15ML SOLN Take 30 mLs (20 g total) by mouth 2 (two) times daily. 03/21/21  Yes Vann, Jessica U, DO  midodrine (PROAMATINE) 5 MG tablet Take 1 tablet (5 mg total) by mouth 3 (three) times daily with meals. 03/21/21  Yes Joseph Art, DO  Multiple Vitamin (MULTIVITAMIN) tablet Take 1 tablet by mouth daily.   Yes [provider]  pantoprazole (PROTONIX) 40 MG tablet Take 1 tablet (40 mg total) by mouth 2 (two) times daily. 03/21/21 05/20/21 Yes Joseph Art, DO    Allergies    Amoxicillin, Cephalexin, and Penicillins  Review of Systems   Review of Systems  Constitutional:  Negative for fever.  Respiratory:  Positive for shortness of breath.   Cardiovascular:  Positive for leg swelling. Negative for chest pain.  Gastrointestinal:  Positive for abdominal pain. Negative for vomiting.  All other systems reviewed and are negative.  Physical Exam Updated Vital Signs BP 119/74   Pulse 84   Temp 98.2 F (36.8 C) (Oral)   Resp 15   SpO2 100%   Physical Exam CONSTITUTIONAL: Chronically ill-appearing, no acute distress HEAD: Normocephalic/atraumatic EYES: EOMI/PERRL, scleral icterus noted ENMT: Mucous membranes moist NECK: supple no meningeal signs SPINE/BACK:entire spine nontender CV: S1/S2 noted, no murmurs/rubs/gallops noted LUNGS: Lungs are clear to auscultation bilaterally, no apparent distress ABDOMEN: soft, distended, but no  focal tenderness GU:no cva tenderness NEURO: Pt is awake/alert/appropriate, moves all extremitiesx4.  No facial droop.   EXTREMITIES: pulses normal/equal, full ROM, pitting edema noted bilateral lower extremities SKIN: Jaundice PSYCH: no abnormalities of mood noted, alert and oriented to situation  ED Results / Procedures / Treatments   Labs (all labs ordered are listed, but only abnormal results are displayed) Labs Reviewed  PROTIME-INR - Abnormal; Notable for the following components:      Result Value   Prothrombin Time 19.6 (*)    INR 1.7 (*)    All other components within normal limits  APTT - Abnormal; Notable for the following components:   aPTT 42 (*)    All other components within normal limits  COMPREHENSIVE METABOLIC PANEL - Abnormal; Notable for the following components:   Sodium 134 (*)    CO2 21 (*)    Creatinine, Ser 1.39 (*)    Albumin 2.9 (*)    AST 75 (*)    Alkaline Phosphatase 136 (*)    Total Bilirubin 8.2 (*)    GFR, Estimated 58 (*)    All other components within normal limits  LIPASE, BLOOD - Abnormal; Notable for the following components:   Lipase 63 (*)    All other components within normal limits  CBC WITH DIFFERENTIAL/PLATELET - Abnormal; Notable for the following components:   RBC 2.47 (*)    Hemoglobin 8.5 (*)    HCT 25.3 (*)    MCV 102.4 (*)    MCH 34.4 (*)    RDW 18.7 (*)    All other components within normal limits  BRAIN NATRIURETIC PEPTIDE - Abnormal; Notable for the following components:   B Natriuretic Peptide 184.4 (*)    All other components within normal limits  AMMONIA - Abnormal; Notable for the following components:   Ammonia 41 (*)    All other components within normal limits    EKG EKG Interpretation  Date/Time:  Friday March 28 2021 17:35:10 EST Ventricular Rate:  72 PR Interval:  160 QRS Duration: 86 QT Interval:  422 QTC Calculation: 462 R Axis:   0 Text Interpretation: Sinus rhythm with Premature atrial  complexes Low voltage QRS Cannot rule out Anterior infarct , age undetermined Abnormal ECG Confirmed by Zadie Rhine ([id number]) on 03/29/2021 5:43:04 AM  Radiology DG Chest 2 View  Result Date: 03/28/2021 CLINICAL DATA:  Shortness of breath EXAM: CHEST - 2 VIEW COMPARISON:  03/15/2021 FINDINGS: Lungs are clear. No frank interstitial edema. No pleural effusion or pneumothorax. The heart is normal in size. Degenerative changes of the visualized thoracolumbar spine. IMPRESSION: Normal chest radiographs. Electronically Signed   By: Charline Bills M.D.   On: 03/28/2021 19:07    Procedures Procedures   Medications Ordered in ED Medications - No data to display  ED Course  I have reviewed the triage vital signs and the nursing notes.  Pertinent labs & imaging results that were available during my care of the patient were reviewed by me and considered in my medical decision making (see chart for details).    MDM Rules/Calculators/A&P                           6:54 AM Patient with recent admission for hepatic encephalopathy, due to alcoholic cirrhosis also noted to have gastric ulcer due to NSAIDs His last paracentesis was around November 30. Given his appearance, patient would benefit from IR guided paracentesis.  Patient afebrile without any vomiting, low suspicion for SBP Plan for IR guided paracentesis 7:08 AM Signed out to Dr. Anitra Lauth at shift change  Final Clinical Impression(s) / ED Diagnoses Final diagnoses:  None    Rx / DC Orders ED Discharge Orders     None        Zadie Rhine, MD 03/29/21 [phone]

## 2021-03-29 NOTE — ED Notes (Signed)
Patient transported to IR 

## 2021-03-29 NOTE — Discharge Instructions (Signed)
Follow-up with your doctor as planned.  Do not drink more than 2 bottles of water per day like you have been doing.  Stick with a low salt diet.  Because your kidney function is improving they are probably going to start you back on a medicine to try to keep the fluid off.  All your lab work today is improved from prior.

## 2021-03-29 NOTE — ED Notes (Addendum)
Patient awaiting IR at this time. IR called to inquire if labs need to be drawn off of fluid removed during paracentesis. Provider only wants fluid removed; no labs needed. Attempted to reach IR to pass along information, however phone number they called from is not available from caller ID. Called general IR number; no answer.

## 2021-04-03 ENCOUNTER — Ambulatory Visit: Payer: Self-pay | Admitting: Gastroenterology

## 2021-04-04 ENCOUNTER — Emergency Department (HOSPITAL_COMMUNITY): Payer: Medicaid Other

## 2021-04-04 ENCOUNTER — Other Ambulatory Visit: Payer: Self-pay

## 2021-04-04 ENCOUNTER — Encounter (HOSPITAL_COMMUNITY): Payer: Self-pay

## 2021-04-04 ENCOUNTER — Inpatient Hospital Stay (HOSPITAL_COMMUNITY)
Admission: EM | Admit: 2021-04-04 | Discharge: 2021-04-10 | DRG: 432 | Disposition: A | Discharge status: Home / Self Care | Payer: Medicaid Other | Attending: Internal Medicine | Admitting: Internal Medicine

## 2021-04-04 DIAGNOSIS — R188 Other ascites: Secondary | ICD-10-CM | POA: Diagnosis present

## 2021-04-04 DIAGNOSIS — Z880 Allergy status to penicillin: Secondary | ICD-10-CM | POA: Diagnosis not present

## 2021-04-04 DIAGNOSIS — Z91040 Latex allergy status: Secondary | ICD-10-CM | POA: Diagnosis not present

## 2021-04-04 DIAGNOSIS — N1831 Chronic kidney disease, stage 3a: Secondary | ICD-10-CM | POA: Diagnosis present

## 2021-04-04 DIAGNOSIS — E871 Hypo-osmolality and hyponatremia: Secondary | ICD-10-CM | POA: Diagnosis not present

## 2021-04-04 DIAGNOSIS — Z8249 Family history of ischemic heart disease and other diseases of the circulatory system: Secondary | ICD-10-CM

## 2021-04-04 DIAGNOSIS — Z841 Family history of disorders of kidney and ureter: Secondary | ICD-10-CM

## 2021-04-04 DIAGNOSIS — Z823 Family history of stroke: Secondary | ICD-10-CM

## 2021-04-04 DIAGNOSIS — K767 Hepatorenal syndrome: Secondary | ICD-10-CM | POA: Diagnosis present

## 2021-04-04 DIAGNOSIS — D6959 Other secondary thrombocytopenia: Secondary | ICD-10-CM | POA: Diagnosis present

## 2021-04-04 DIAGNOSIS — K766 Portal hypertension: Secondary | ICD-10-CM | POA: Diagnosis present

## 2021-04-04 DIAGNOSIS — Z28310 Unvaccinated for covid-19: Secondary | ICD-10-CM

## 2021-04-04 DIAGNOSIS — Z881 Allergy status to other antibiotic agents status: Secondary | ICD-10-CM

## 2021-04-04 DIAGNOSIS — K7682 Hepatic encephalopathy: Secondary | ICD-10-CM | POA: Diagnosis present

## 2021-04-04 DIAGNOSIS — X58XXXD Exposure to other specified factors, subsequent encounter: Secondary | ICD-10-CM | POA: Diagnosis present

## 2021-04-04 DIAGNOSIS — Z6830 Body mass index (BMI) 30.0-30.9, adult: Secondary | ICD-10-CM

## 2021-04-04 DIAGNOSIS — D61818 Other pancytopenia: Secondary | ICD-10-CM | POA: Diagnosis present

## 2021-04-04 DIAGNOSIS — I8510 Secondary esophageal varices without bleeding: Secondary | ICD-10-CM | POA: Diagnosis present

## 2021-04-04 DIAGNOSIS — N179 Acute kidney failure, unspecified: Secondary | ICD-10-CM | POA: Diagnosis present

## 2021-04-04 DIAGNOSIS — Z91018 Allergy to other foods: Secondary | ICD-10-CM

## 2021-04-04 DIAGNOSIS — D731 Hypersplenism: Secondary | ICD-10-CM | POA: Diagnosis present

## 2021-04-04 DIAGNOSIS — E876 Hypokalemia: Secondary | ICD-10-CM | POA: Diagnosis not present

## 2021-04-04 DIAGNOSIS — L03115 Cellulitis of right lower limb: Secondary | ICD-10-CM | POA: Diagnosis present

## 2021-04-04 DIAGNOSIS — U071 COVID-19: Secondary | ICD-10-CM | POA: Diagnosis present

## 2021-04-04 DIAGNOSIS — E872 Acidosis, unspecified: Secondary | ICD-10-CM | POA: Diagnosis not present

## 2021-04-04 DIAGNOSIS — F1010 Alcohol abuse, uncomplicated: Secondary | ICD-10-CM | POA: Diagnosis present

## 2021-04-04 DIAGNOSIS — E870 Hyperosmolality and hypernatremia: Secondary | ICD-10-CM | POA: Diagnosis not present

## 2021-04-04 DIAGNOSIS — R0602 Shortness of breath: Secondary | ICD-10-CM

## 2021-04-04 DIAGNOSIS — K3189 Other diseases of stomach and duodenum: Secondary | ICD-10-CM | POA: Diagnosis present

## 2021-04-04 DIAGNOSIS — K7031 Alcoholic cirrhosis of liver with ascites: Secondary | ICD-10-CM | POA: Diagnosis present

## 2021-04-04 DIAGNOSIS — E669 Obesity, unspecified: Secondary | ICD-10-CM | POA: Diagnosis present

## 2021-04-04 DIAGNOSIS — E8770 Fluid overload, unspecified: Secondary | ICD-10-CM | POA: Diagnosis not present

## 2021-04-04 DIAGNOSIS — Z8711 Personal history of peptic ulcer disease: Secondary | ICD-10-CM

## 2021-04-04 DIAGNOSIS — S81801D Unspecified open wound, right lower leg, subsequent encounter: Secondary | ICD-10-CM

## 2021-04-04 DIAGNOSIS — Z8614 Personal history of Methicillin resistant Staphylococcus aureus infection: Secondary | ICD-10-CM

## 2021-04-04 DIAGNOSIS — R7401 Elevation of levels of liver transaminase levels: Secondary | ICD-10-CM | POA: Diagnosis not present

## 2021-04-04 DIAGNOSIS — Z79899 Other long term (current) drug therapy: Secondary | ICD-10-CM

## 2021-04-04 LAB — COMPREHENSIVE METABOLIC PANEL
ALT: 25 U/L (ref 0–44)
AST: 90 U/L — ABNORMAL HIGH (ref 15–41)
Albumin: 2.5 g/dL — ABNORMAL LOW (ref 3.5–5.0)
Alkaline Phosphatase: 170 U/L — ABNORMAL HIGH (ref 38–126)
Anion gap: 9 (ref 5–15)
BUN: 29 mg/dL — ABNORMAL HIGH (ref 6–20)
CO2: 19 mmol/L — ABNORMAL LOW (ref 22–32)
Calcium: 8.3 mg/dL — ABNORMAL LOW (ref 8.9–10.3)
Chloride: 107 mmol/L (ref 98–111)
Creatinine, Ser: 2.46 mg/dL — ABNORMAL HIGH (ref 0.61–1.24)
GFR, Estimated: 29 mL/min — ABNORMAL LOW (ref >60)
Glucose, Bld: 113 mg/dL — ABNORMAL HIGH (ref 70–99)
Potassium: 3.6 mmol/L (ref 3.5–5.1)
Sodium: 135 mmol/L (ref 135–145)
Total Bilirubin: 6.4 mg/dL — ABNORMAL HIGH (ref 0.3–1.2)
Total Protein: 6.9 g/dL (ref 6.5–8.1)

## 2021-04-04 LAB — PROTIME-INR
INR: 1.5 — ABNORMAL HIGH (ref 0.8–1.2)
Prothrombin Time: 18.5 seconds — ABNORMAL HIGH (ref 11.4–15.2)

## 2021-04-04 LAB — RESP PANEL BY RT-PCR (FLU A&B, COVID) ARPGX2
Influenza A by PCR: NEGATIVE
Influenza B by PCR: NEGATIVE
SARS Coronavirus 2 by RT PCR: POSITIVE — AB

## 2021-04-04 LAB — CBC WITH DIFFERENTIAL/PLATELET
Abs Immature Granulocytes: 0.01 10*3/uL (ref 0.00–0.07)
Basophils Absolute: 0 10*3/uL (ref 0.0–0.1)
Basophils Relative: 0 %
Eosinophils Absolute: 0 10*3/uL (ref 0.0–0.5)
Eosinophils Relative: 1 %
HCT: 28.2 % — ABNORMAL LOW (ref 39.0–52.0)
Hemoglobin: 9.6 g/dL — ABNORMAL LOW (ref 13.0–17.0)
Immature Granulocytes: 0 %
Lymphocytes Relative: 27 %
Lymphs Abs: 1.1 10*3/uL (ref 0.7–4.0)
MCH: 33.9 pg (ref 26.0–34.0)
MCHC: 34 g/dL (ref 30.0–36.0)
MCV: 99.6 fL (ref 80.0–100.0)
Monocytes Absolute: 0.4 10*3/uL (ref 0.1–1.0)
Monocytes Relative: 10 %
Neutro Abs: 2.3 10*3/uL (ref 1.7–7.7)
Neutrophils Relative %: 62 %
Platelets: 97 10*3/uL — ABNORMAL LOW (ref 150–400)
RBC: 2.83 MIL/uL — ABNORMAL LOW (ref 4.22–5.81)
RDW: 19.1 % — ABNORMAL HIGH (ref 11.5–15.5)
WBC: 3.8 10*3/uL — ABNORMAL LOW (ref 4.0–10.5)
nRBC: 0 % (ref 0.0–0.2)

## 2021-04-04 LAB — AMMONIA: Ammonia: 59 umol/L — ABNORMAL HIGH (ref 9–35)

## 2021-04-04 LAB — LIPASE, BLOOD: Lipase: 67 U/L — ABNORMAL HIGH (ref 11–51)

## 2021-04-04 LAB — BRAIN NATRIURETIC PEPTIDE: B Natriuretic Peptide: 97.6 pg/mL (ref 0.0–100.0)

## 2021-04-04 MED ORDER — VANCOMYCIN HCL 2000 MG/400ML IV SOLN
2000.0000 mg | Freq: Once | INTRAVENOUS | Status: AC
  Administered 2021-04-04: 2000 mg via INTRAVENOUS
  Filled 2021-04-04: qty 400

## 2021-04-04 MED ORDER — SODIUM CHLORIDE 0.9 % IV SOLN
200.0000 mg | Freq: Once | INTRAVENOUS | Status: AC
  Administered 2021-04-04: 200 mg via INTRAVENOUS
  Filled 2021-04-04: qty 40

## 2021-04-04 MED ORDER — SODIUM CHLORIDE 0.9 % IV SOLN
2.0000 g | INTRAVENOUS | Status: DC
  Administered 2021-04-05 – 2021-04-08 (×4): 2 g via INTRAVENOUS
  Filled 2021-04-04 (×4): qty 20

## 2021-04-04 MED ORDER — SODIUM CHLORIDE 0.9 % IV SOLN
100.0000 mg | Freq: Every day | INTRAVENOUS | Status: DC
  Filled 2021-04-04: qty 20

## 2021-04-04 MED ORDER — VANCOMYCIN VARIABLE DOSE PER UNSTABLE RENAL FUNCTION (PHARMACIST DOSING): Status: DC

## 2021-04-04 MED ORDER — SODIUM CHLORIDE 0.9 % IV SOLN
2.0000 g | Freq: Once | INTRAVENOUS | Status: AC
  Administered 2021-04-04: 2 g via INTRAVENOUS
  Filled 2021-04-04: qty 20

## 2021-04-04 NOTE — ED Triage Notes (Signed)
C/o SOB progressively since Saturday when released from hospital. No  CP c/o of fever earlier this weak.  Hx of cirrhosis

## 2021-04-04 NOTE — Progress Notes (Signed)
Pharmacy Antibiotic Note  Kyle Pitts is a 60 y.o. male admitted on 04/04/2021 with cellulitis.  Pharmacy has been consulted for vancomycin dosing.  Patient with a history of alcoholic cirrhosis, esophageal varices, gastric/duodenal ulcer, septic right knee . Patient presenting with SOB and abdominal ascites. Patient with noted  right LE pre-tibial wound cellulitis. Patient is also COVID positive (remdesivir course ordered)  SCr 2.46 - baseline is variable WBC 3.8; T 98 F  Plan: Vancomycin 2000mg  once subsequent dosing per level unless renal function proves to be stable Trend WBC, Fever, Renal function, & Clinical course F/u cultures, clinical course, WBC, fever De-escalate when able  Height: 6' (182.9 cm) Weight: 112.6 kg (248 lb 3.8 oz) IBW/kg (Calculated) : 77.6  Temp (24hrs), Avg:98 F (36.7 C), Min:98 F (36.7 C), Max:98 F (36.7 C)  Recent Labs  Lab 04/04/21 1558  WBC 3.8*  CREATININE 2.46*    Estimated Creatinine Clearance: 41.9 mL/min (A) (by C-G formula based on SCr of 2.46 mg/dL (H)).    Allergies  Allergen Reactions   Latex Itching   Tape Itching   Amoxicillin Other (See Comments)    Itching around eyes and scalp on amoxicilin   Cephalexin Nausea Only    Upset stomach Ceftriaxone is ok   Penicillins Rash   Antimicrobials this admission: vancomycin 12/16 >>   Microbiology results: Pending  Thank you for allowing pharmacy to be a part of this patients care.  1/17, PharmD, BCPS 04/04/2021 7:43 PM ED Clinical Pharmacist -  [phone]

## 2021-04-04 NOTE — H&P (Signed)
History and Physical    Kyle Pitts WTU:[id number] DOB: 08/04/1960 DOA: 04/04/2021  PCP: Default, Provider, MD  Patient coming from: Home  I have personally briefly reviewed patient's old medical records in [facility]  Chief Complaint: Increasing shortness of breath and ascites  HPI: Kyle Pitts is a 60 y.o. male with medical history significant for alcoholic cirrhosis, esophageal varices, gastric/duodenal ulcer, septic right knee who presents with concerns of increasing shortness of breath and abdominal ascites.  For the past week, he has noticed gradual increasing distention of his abdomen to the point where it made him feel more short of breath constantly.  He also notes some sinus congestion and cough.  About 4 days ago had 3 days of fever.  Denies abdominal pain.  Reports fluid restriction to 2 bottles of water daily.  Last alcohol use back in October.  He was recently admitted from 11/26-12/3 for hepatic encephalopathy with decompensated cirrhosis with ascites and had melena. He underwent EGD and found to have gastric and duodenal ulcer. 03/16/2021-underwent diagnostic and therapeutic paracentesis with 4 L of fluid removed.  Lab work not indicative of SBP. 03/18/2021 had removal of 2.5 L and also received albumin He was not started on Lasix or Aldactone at that time due to hepatorenal syndrome.  03/29/2021-therapeutic paracentesis outpatient with approximately 6.1 L removed  ED Course: He was afebrile and normotensive on room air. WBC 3.8, hemoglobin 9.6, platelet of 97. Sodium of 135, K3.6, creatinine of 2.46 which is elevated from 1.39, BG of 113 LFTs remain mildly elevated but stable.  T bili of 6.4 which is improved from 8 prior.  COVID PCR positive.  He was noted to have right lower extremity wound cellulitis and started on IV Rocephin.   Review of Systems: Constitutional: No Weight Change, No Fever ENT/Mouth: No sore throat, No Rhinorrhea Eyes: No Eye  Pain, No Vision Changes Cardiovascular: No Chest Pain,+ SOB Respiratory: + Cough, No Sputum, No Wheezing, no Dyspnea  Gastrointestinal: No Nausea, No Vomiting, No Diarrhea, No Constipation, No Pain Genitourinary: no Urinary Incontinence, Musculoskeletal: No Arthralgias, No Myalgias Skin: No Skin Lesions, No Pruritus, Neuro: no Weakness, No Numbness Psych: No Anxiety/Panic, No Depression, no decrease appetite Heme/Lymph: No Bruising, No Bleeding  Past Medical History:  Diagnosis Date   Allergic reaction 11/21/2020   Cirrhosis (HCC)    Hyponatremia    Myofasciitis 11/21/2020   Osteomyelitis of right leg (HCC) 11/21/2020   Septic arthritis (HCC)    Streptococcus infection, group A 11/21/2020    Past Surgical History:  Procedure Laterality Date   BIOPSY  02/15/2021   Procedure: BIOPSY;  Surgeon: Jenel Lucks, MD;  Location: [facility] ENDOSCOPY;  Service: Gastroenterology;;   ESOPHAGOGASTRODUODENOSCOPY N/A 02/15/2021   Procedure: ESOPHAGOGASTRODUODENOSCOPY (EGD);  Surgeon: Jenel Lucks, MD;  Location: [facility] ENDOSCOPY;  Service: Gastroenterology;  Laterality: N/A;   ESOPHAGOGASTRODUODENOSCOPY (EGD) WITH PROPOFOL N/A 03/19/2021   Procedure: ESOPHAGOGASTRODUODENOSCOPY (EGD) WITH PROPOFOL;  Surgeon: Imogene Burn, MD;  Location: [facility] ENDOSCOPY;  Service: Gastroenterology;  Laterality: N/A;   HEMOSTASIS CLIP PLACEMENT  03/19/2021   Procedure: HEMOSTASIS CLIP PLACEMENT;  Surgeon: Imogene Burn, MD;  Location: [facility] ENDOSCOPY;  Service: Gastroenterology;;   I & D EXTREMITY Right 10/09/2020   Procedure: IRRIGATION AND DEBRIDEMENT EXTREMITY;  Surgeon: Yolonda Kida, MD;  Location: [facility] ORS;  Service: Orthopedics;  Laterality: Right;   IR PARACENTESIS  03/18/2021   KNEE ARTHROSCOPY Right 09/27/2020   Procedure: ARTHROSCOPY KNEE, WASH OUT;  Surgeon: Venita Lick,  MD;  Location: [facility] ORS;  Service: Orthopedics;  Laterality: Right;   KNEE ARTHROSCOPY Right 10/09/2020   Procedure: ARTHROSCOPY  KNEE,ARTHROSCOPIC I & D, CALF ABSCESS;  Surgeon: Yolonda Kida, MD;  Location: [facility] ORS;  Service: Orthopedics;  Laterality: Right;     reports that he has never smoked. He has never used smokeless tobacco. He reports current alcohol use of about 24.0 standard drinks per week. He reports that he does not currently use drugs after having used the following drugs: Cocaine and Marijuana. Social History  Allergies  Allergen Reactions   Latex Itching   Tape Itching   Amoxicillin Other (See Comments)    Itching around eyes and scalp on amoxicilin   Cephalexin Nausea Only    Upset stomach Ceftriaxone is ok   Penicillins Rash    Family History  Problem Relation Age of Onset   Hepatitis Mother    Cirrhosis Mother    Stroke Father    Heart failure Father    Kidney failure Brother      Prior to Admission medications   Medication Sig Start Date End Date Taking? Authorizing Provider  lactulose, encephalopathy, (CHRONULAC) 10 GM/15ML SOLN Take 30 mLs (20 g total) by mouth 2 (two) times daily. 03/21/21  Yes Vann, Jessica U, DO  midodrine (PROAMATINE) 5 MG tablet Take 1 tablet (5 mg total) by mouth 3 (three) times daily with meals. 03/21/21  Yes Vann, Jessica U, DO  pantoprazole (PROTONIX) 40 MG tablet Take 1 tablet (40 mg total) by mouth 2 (two) times daily. 03/21/21 05/20/21 Yes Joseph Art, DO    Physical Exam: Vitals:   04/04/21 1715 04/04/21 1730 04/04/21 1815 04/04/21 1830  BP: 132/84 119/84 105/86 116/89  Pulse: 76 74 68 66  Resp: 13 (!) Temp:      TempSrc:      SpO2: 99% 96% 98% 99%  Weight:      Height:        Constitutional: NAD, calm, comfortable, nontoxic chronically ill-appearing male with generalized jaundice laying at approximately 40 degree incline in bed Vitals:   04/04/21 1715 04/04/21 1730 04/04/21 1815 04/04/21 1830  BP: 132/84 119/84 105/86 116/89  Pulse: 76 74 68 66  Resp: 13 (!) Temp:      TempSrc:      SpO2: 99% 96% 98% 99%   Weight:      Height:       Eyes: PERRL, lids and conjunctivae normal ENMT: Mucous membranes are moist.  Neck: normal, supple Respiratory: clear to auscultation bilaterally, no wheezing, no crackles. Normal respiratory effort. No accessory muscle use.  Cardiovascular: Regular rate and rhythm, no murmurs / rubs / gallops.  +3 pitting edema bilateral lower extremity up to knee.   Abdomen: Large, distended tight abdomen without tenderness with palpation  musculoskeletal: no clubbing / cyanosis. No joint deformity upper and lower extremities. Good ROM, no contractures. Normal muscle tone.  Skin: Large right pretibial erythematous wound with purulent drainage.  No surrounding erythema or increase in warmth.   Neurologic: CN 2-12 grossly intact. Strength 5/5 in all 4.  Psychiatric: Normal judgment and insight. Alert and oriented x 3. Normal mood.     Labs on Admission: I have personally reviewed following labs and imaging studies  CBC: Recent Labs  Lab 04/04/21 1558  WBC 3.8*  NEUTROABS 2.3  HGB 9.6*  HCT 28.2*  MCV 99.6  PLT 97*   Basic Metabolic Panel: Recent Labs  Lab 04/04/21 1558  NA 135  K 3.6  CL 107  CO2 19*  GLUCOSE 113*  BUN 29*  CREATININE 2.46*  CALCIUM 8.3*   GFR: Estimated Creatinine Clearance: 41.9 mL/min (A) (by C-G formula based on SCr of 2.46 mg/dL (H)). Liver Function Tests: Recent Labs  Lab 04/04/21 1558  AST 90*  ALT 25  ALKPHOS 170*  BILITOT 6.4*  PROT 6.9  ALBUMIN 2.5*   Recent Labs  Lab 04/04/21 1558  LIPASE 67*   Recent Labs  Lab 03/28/21 2140 04/04/21 1558  AMMONIA 41* 59*   Coagulation Profile: Recent Labs  Lab 04/04/21 1558  INR 1.5*   Cardiac Enzymes: No results for input(s): CKTOTAL, CKMB, CKMBINDEX, TROPONINI in the last 168 hours. BNP (last 3 results) No results for input(s): PROBNP in the last 8760 hours. HbA1C: No results for input(s): HGBA1C in the last 72 hours. CBG: No results for input(s): GLUCAP in the  last 168 hours. Lipid Profile: No results for input(s): CHOL, HDL, LDLCALC, TRIG, CHOLHDL, LDLDIRECT in the last 72 hours. Thyroid Function Tests: No results for input(s): TSH, T4TOTAL, FREET4, T3FREE, THYROIDAB in the last 72 hours. Anemia Panel: No results for input(s): VITAMINB12, FOLATE, FERRITIN, TIBC, IRON, RETICCTPCT in the last 72 hours. Urine analysis:    Component Value Date/Time   COLORURINE AMBER (A) 03/15/2021 1634   APPEARANCEUR HAZY (A) 03/15/2021 1634   LABSPEC 1.015 03/15/2021 1634   PHURINE 5.0 03/15/2021 1634   GLUCOSEU NEGATIVE 03/15/2021 1634   HGBUR NEGATIVE 03/15/2021 1634   BILIRUBINUR SMALL (A) 03/15/2021 1634   KETONESUR NEGATIVE 03/15/2021 1634   PROTEINUR NEGATIVE 03/15/2021 1634   NITRITE NEGATIVE 03/15/2021 1634   LEUKOCYTESUR NEGATIVE 03/15/2021 1634    Radiological Exams on Admission: DG Chest Port 1 View  Result Date: 04/04/2021 CLINICAL DATA:  Shortness of breath EXAM: PORTABLE CHEST 1 VIEW COMPARISON:  03/28/2021 FINDINGS: Low lung volumes with subsegmental atelectasis at the left base. No consolidation or effusion. Normal cardiac size IMPRESSION: Hypoventilatory changes with subsegmental atelectasis or scar at the left base Electronically Signed   By: Jasmine Pang M.D.   On: 04/04/2021 17:01      Assessment/Plan  Decompensated alcoholic cirrhosis with ascites  Last therapeutic paracentesis on 12/10//2022 with 6.1L removed Paracentesis ordered to be done in the morning  -reports fluid restriction of 2 bottles of water a day - Last alcohol drink in Oct- previously not candidate for transplant due to ongoing alcohol use  -Current MELD score of 30 with 52.6% 11-month mortality   COVID-19 viral infection suspect dyspnea in part due to ascites and COVID infection start remdesivir  Right LE pre-tibial wound cellulitis -hx of right septic arthritis and extensive severe cellulitis and fasciitis requiring I&D, synovectomy, anterior and lateral  compartment fasciotomy in June 2022.  Reports a small wound since then that has gradually enlarged. Has only been doing self-wound care at home -IV Vanc added to IV Rocephin since he had hx of MRSA in the past -wound care consult   AKI/Heptorenal syndrome -creatinine worse at 2.46 compare to 1.39 a week ago -not able to be started on Lasix/Aldacone at last admission due to renal function   Pancytopenia  -low WBC secondary to COVID infection. Hgb and plt stable at baseline. No signs of bleeding.   Transaminitis stable at baseline. Monitor closely while on remdesivir  DVT prophylaxis:.SCD Code Status: Full Family Communication: Plan discussed with patient at bedside  disposition Plan: Home with at least 2 midnight stays  Consults called:  Admission status: inpatient   Level of care: Med-Surg  Status is: Inpatient  Remains inpatient appropriate because: critically ill pt with high mortality rate from decompensated liver cirrhosis         Kyle Brindle T Remington Skalsky DO [facility]   If 7PM-7AM, please contact night-coverage [email or web address]   04/04/2021, 7:40 PM

## 2021-04-04 NOTE — ED Provider Notes (Signed)
[facility] Provider [facility]   CSN: [id number] Arrival date & time: 04/04/21  1547     History Chief Complaint  Patient presents with   Shortness of Breath    Kyle Pitts is a 60 y.o. male.  60 yo M with a cc of sob.  Going on for the past week.  The patient was just in the hospital for the same ended up having 6 L of fluid drawn off.  Since then has had progressive worsening edema and difficulty breathing.  Has had a fever at home as high as 101 over the past 24 hours but does not think he had 1 today.  Has had a cough and a bit of sputum production.  Mild diffuse abdominal pain that he thinks is due to the edema.  The history is provided by the patient.  Shortness of Breath Severity:  Moderate Onset quality:  Gradual Duration:  1 week Timing:  Constant Progression:  Worsening Chronicity:  New Relieved by:  Nothing Worsened by:  Nothing Ineffective treatments:  None tried Associated symptoms: cough and fever   Associated symptoms: no abdominal pain, no chest pain, no headaches, no rash and no vomiting       Past Medical History:  Diagnosis Date   Allergic reaction 11/21/2020   Cirrhosis (HCC)    Hyponatremia    Myofasciitis 11/21/2020   Osteomyelitis of right leg (HCC) 11/21/2020   Septic arthritis (HCC)    Streptococcus infection, group A 11/21/2020    Patient Active Problem List   Diagnosis Date Noted   COVID-19 virus infection 04/04/2021   AKI (acute kidney injury) (HCC) 04/04/2021   Cellulitis of right leg 04/04/2021   Pancytopenia (HCC) 04/04/2021   Transaminitis 04/04/2021   Hepatorenal syndrome (HCC)    Metabolic acidosis 03/16/2021   Hepatic encephalopathy 03/15/2021   Severe protein-calorie malnutrition (HCC) 03/15/2021   Thrombocytopenia (HCC) 03/15/2021   Alcoholic cirrhosis of liver with ascites (HCC)    Esophageal varices in alcoholic cirrhosis (HCC)    Gastric ulcer due to nonsteroidal anti-inflammatory  drug (NSAID)    Duodenal ulcer    Portal hypertensive gastropathy (HCC)    Acute blood loss anemia    Acute renal failure (ARF) (HCC) 02/13/2021   Elevated LFTs 02/13/2021   Allergic reaction 11/21/2020   Osteomyelitis of right leg (HCC) 11/21/2020   Streptococcus infection, group A 11/21/2020   Myofasciitis 11/21/2020   PICC line infection 11/08/2020   Anemia 11/08/2020   Decompensated hepatic cirrhosis (HCC) 10/07/2020   Hyponatremia 10/07/2020   Leucocytosis 10/07/2020   Cellulitis and abscess of right leg 10/07/2020   Septic arthritis of knee (HCC) 09/28/2020   ALLERGIC RHINITIS 01/13/2007   ASTHMA 01/13/2007    Past Surgical History:  Procedure Laterality Date   BIOPSY  02/15/2021   Procedure: BIOPSY;  Surgeon: Jenel Lucksunningham, Scott E, MD;  [facility];  Service: Gastroenterology;;   ESOPHAGOGASTRODUODENOSCOPY N/A 02/15/2021   Procedure: ESOPHAGOGASTRODUODENOSCOPY (EGD);  Surgeon: Jenel Lucksunningham, Scott E, MD;  [facility];  Service: Gastroenterology;  Laterality: N/A;   ESOPHAGOGASTRODUODENOSCOPY (EGD) WITH PROPOFOL N/A 03/19/2021   Procedure: ESOPHAGOGASTRODUODENOSCOPY (EGD) WITH PROPOFOL;  Surgeon: Imogene Burnorsey, Ying C, MD;  [facility];  Service: Gastroenterology;  Laterality: N/A;   HEMOSTASIS CLIP PLACEMENT  03/19/2021   Procedure: HEMOSTASIS CLIP PLACEMENT;  Surgeon: Imogene Burnorsey, Ying C, MD;  [facility];  Service: Gastroenterology;;   I & D EXTREMITY Right 10/09/2020   Procedure: IRRIGATION AND DEBRIDEMENT EXTREMITY;  Surgeon: Aundria Rudogers,  Elly Modena, MD;  Location: [facility] ORS;  Service: Orthopedics;  Laterality: Right;   IR PARACENTESIS  03/18/2021   KNEE ARTHROSCOPY Right 09/27/2020   Procedure: ARTHROSCOPY KNEE, [place] OUT;  Surgeon: Melina Schools, MD;  Location: [facility] ORS;  Service: Orthopedics;  Laterality: Right;   KNEE ARTHROSCOPY Right 10/09/2020   Procedure: ARTHROSCOPY KNEE,ARTHROSCOPIC I & D, CALF ABSCESS;  Surgeon: Nicholes Stairs, MD;   Location: [facility] ORS;  Service: Orthopedics;  Laterality: Right;       Family History  Problem Relation Age of Onset   Hepatitis Mother    Cirrhosis Mother    Stroke Father    Heart failure Father    Kidney failure Brother     Social History   Tobacco Use   Smoking status: Never   Smokeless tobacco: Never  Vaping Use   Vaping Use: Never used  Substance Use Topics   Alcohol use: Yes    Alcohol/week: 24.0 standard drinks    Types: 24 Cans of beer per week    Comment: 3-4 when home from work   Drug use: Not Currently    Types: Cocaine, Marijuana    Comment: last used 1 month ago    Home Medications Prior to Admission medications   Medication Sig Start Date End Date Taking? Authorizing Provider  lactulose, encephalopathy, (CHRONULAC) 10 GM/15ML SOLN Take 30 mLs (20 g total) by mouth 2 (two) times daily. 03/21/21  Yes Vann, Jessica U, DO  midodrine (PROAMATINE) 5 MG tablet Take 1 tablet (5 mg total) by mouth 3 (three) times daily with meals. 03/21/21  Yes Vann, Jessica U, DO  pantoprazole (PROTONIX) 40 MG tablet Take 1 tablet (40 mg total) by mouth 2 (two) times daily. 03/21/21 05/20/21 Yes Vann, Janett Billow U, DO    Allergies    Latex, Tape, Amoxicillin, Cephalexin, and Penicillins  Review of Systems   Review of Systems  Constitutional:  Positive for fever. Negative for chills.  HENT:  Negative for congestion and facial swelling.   Eyes:  Negative for discharge and visual disturbance.  Respiratory:  Positive for cough and shortness of breath.   Cardiovascular:  Negative for chest pain and palpitations.  Gastrointestinal:  Negative for abdominal pain, diarrhea and vomiting.  Musculoskeletal:  Negative for arthralgias and myalgias.  Skin:  Negative for color change and rash.  Neurological:  Negative for tremors, syncope and headaches.  Psychiatric/Behavioral:  Negative for confusion and dysphoric mood.    Physical Exam Updated Vital Signs BP 118/83    Pulse 69    Temp 98 F  (36.7 C) (Oral)    Resp 19    Ht 6' (1.829 m)    Wt 112.6 kg    SpO2 100%    BMI 33.67 kg/m   Physical Exam Vitals and nursing note reviewed.  Constitutional:      Appearance: He is well-developed.  HENT:     Head: Normocephalic and atraumatic.  Eyes:     Pupils: Pupils are equal, round, and reactive to light.     Comments: jaundiced  Neck:     Vascular: No JVD.  Cardiovascular:     Rate and Rhythm: Normal rate and regular rhythm.     Heart sounds: No murmur heard.   No friction rub. No gallop.  Pulmonary:     Effort: No respiratory distress.     Breath sounds: No wheezing.  Abdominal:     General: There is no distension.     Tenderness: There is abdominal tenderness.  There is no guarding or rebound.     Comments: Tensely distended abdomen with fluid wave  Musculoskeletal:        General: Normal range of motion.     Cervical back: Normal range of motion and neck supple.     Comments: Purulent wound to the lateral aspect of the RLE.  No obvious area of induration or fluctuance.   Skin:    Coloration: Skin is not pale.     Findings: No rash.  Neurological:     Mental Status: He is alert and oriented to person, place, and time.  Psychiatric:        Behavior: Behavior normal.    ED Results / Procedures / Treatments   Labs (all labs ordered are listed, but only abnormal results are displayed) Labs Reviewed  RESP PANEL BY RT-PCR (FLU A&B, COVID) ARPGX2 - Abnormal; Notable for the following components:      Result Value   SARS Coronavirus 2 by RT PCR POSITIVE (*)    All other components within normal limits  CBC WITH DIFFERENTIAL/PLATELET - Abnormal; Notable for the following components:   WBC 3.8 (*)    RBC 2.83 (*)    Hemoglobin 9.6 (*)    HCT 28.2 (*)    RDW 19.1 (*)    Platelets 97 (*)    All other components within normal limits  COMPREHENSIVE METABOLIC PANEL - Abnormal; Notable for the following components:   CO2 19 (*)    Glucose, Bld 113 (*)    BUN 29 (*)     Creatinine, Ser 2.46 (*)    Calcium 8.3 (*)    Albumin 2.5 (*)    AST 90 (*)    Alkaline Phosphatase 170 (*)    Total Bilirubin 6.4 (*)    GFR, Estimated 29 (*)    All other components within normal limits  LIPASE, BLOOD - Abnormal; Notable for the following components:   Lipase 67 (*)    All other components within normal limits  AMMONIA - Abnormal; Notable for the following components:   Ammonia 59 (*)    All other components within normal limits  PROTIME-INR - Abnormal; Notable for the following components:   Prothrombin Time 18.5 (*)    INR 1.5 (*)    All other components within normal limits  CULTURE, BLOOD (ROUTINE X 2)  CULTURE, BLOOD (ROUTINE X 2)  BRAIN NATRIURETIC PEPTIDE  COMPREHENSIVE METABOLIC PANEL  CBC    EKG EKG Interpretation  Date/Time:  Friday April 04 2021 15:49:49 EST Ventricular Rate:  78 PR Interval:  167 QRS Duration: 104 QT Interval:  410 QTC Calculation: 467 R Axis:   -31 Text Interpretation: Sinus rhythm Atrial premature complex Left axis deviation Low voltage, extremity and precordial leads Anteroseptal infarct, old No significant change since last tracing Confirmed by Deno Etienne [phone]) on 04/04/2021 4:05:59 PM  Radiology DG Chest Port 1 View  Result Date: 04/04/2021 CLINICAL DATA:  Shortness of breath EXAM: PORTABLE CHEST 1 VIEW COMPARISON:  03/28/2021 FINDINGS: Low lung volumes with subsegmental atelectasis at the left base. No consolidation or effusion. Normal cardiac size IMPRESSION: Hypoventilatory changes with subsegmental atelectasis or scar at the left base Electronically Signed   By: Donavan Foil M.D.   On: 04/04/2021 17:01    Procedures Procedures   Medications Ordered in ED Medications  cefTRIAXone (ROCEPHIN) 2 g in sodium chloride 0.9 % 100 mL IVPB (has no administration in time range)  remdesivir 200 mg in sodium chloride 0.9%  250 mL IVPB (200 mg Intravenous New Bag/Given 04/04/21 2153)    Followed by  remdesivir 100  mg in sodium chloride 0.9 % 100 mL IVPB (has no administration in time range)  vancomycin variable dose per unstable renal function (pharmacist dosing) (has no administration in time range)  cefTRIAXone (ROCEPHIN) 2 g in sodium chloride 0.9 % 100 mL IVPB (0 g Intravenous Stopped 04/04/21 2032)  vancomycin (VANCOREADY) IVPB 2000 mg/400 mL (2,000 mg Intravenous New Bag/Given 04/04/21 2041)    ED Course  I have reviewed the triage vital signs and the nursing notes.  Pertinent labs & imaging results that were available during my care of the patient were reviewed by me and considered in my medical decision making (see chart for details).    MDM Rules/Calculators/A&P                         60 yo M recently in the hospital for ascites comes in with a similar complaint.  Patient had very rapid reaccumulation of fluid.  He is also reporting fevers at home over the past couple days.  We will obtain a laboratory evaluation and chest x-ray a COVID test.  Patient has an AKI.  I am a little bit cautious about giving him IV fluids with his level of ascites.  We will start him on antibiotics for possible worsening cellulitis.  Will discuss with medicine for possible admission for large volume paracentesis.  The patients results and plan were reviewed and discussed.   Any x-rays performed were independently reviewed by myself.   Differential diagnosis were considered with the presenting HPI.  Medications  cefTRIAXone (ROCEPHIN) 2 g in sodium chloride 0.9 % 100 mL IVPB (has no administration in time range)  remdesivir 200 mg in sodium chloride 0.9% 250 mL IVPB (200 mg Intravenous New Bag/Given 04/04/21 2153)    Followed by  remdesivir 100 mg in sodium chloride 0.9 % 100 mL IVPB (has no administration in time range)  vancomycin variable dose per unstable renal function (pharmacist dosing) (has no administration in time range)  cefTRIAXone (ROCEPHIN) 2 g in sodium chloride 0.9 % 100 mL IVPB (0 g  Intravenous Stopped 04/04/21 2032)  vancomycin (VANCOREADY) IVPB 2000 mg/400 mL (2,000 mg Intravenous New Bag/Given 04/04/21 2041)    Vitals:   04/04/21 1830 04/04/21 2035 04/04/21 2100 04/04/21 2145  BP: 116/89 (!) 135/95 119/84 118/83  Pulse: 66 81 71 69  Resp: 19 (!) 22 16 19   Temp:      TempSrc:      SpO2: 99% 95% 99% 100%  Weight:      Height:        Final diagnoses:  Ascites    Admission/ observation were discussed with the admitting physician, patient and/or family and they are comfortable with the plan.      Final Clinical Impression(s) / ED Diagnoses Final diagnoses:  Ascites    Rx / DC Orders ED Discharge Orders     None        , DO 04/04/21 2244

## 2021-04-05 ENCOUNTER — Inpatient Hospital Stay (HOSPITAL_COMMUNITY): Payer: Medicaid Other

## 2021-04-05 LAB — CBC
HCT: 24.6 % — ABNORMAL LOW (ref 39.0–52.0)
Hemoglobin: 8.3 g/dL — ABNORMAL LOW (ref 13.0–17.0)
MCH: 33.5 pg (ref 26.0–34.0)
MCHC: 33.7 g/dL (ref 30.0–36.0)
MCV: 99.2 fL (ref 80.0–100.0)
Platelets: 85 10*3/uL — ABNORMAL LOW (ref 150–400)
RBC: 2.48 MIL/uL — ABNORMAL LOW (ref 4.22–5.81)
RDW: 18.7 % — ABNORMAL HIGH (ref 11.5–15.5)
WBC: 5 10*3/uL (ref 4.0–10.5)
nRBC: 0 % (ref 0.0–0.2)

## 2021-04-05 LAB — COMPREHENSIVE METABOLIC PANEL
ALT: 18 U/L (ref 0–44)
AST: 83 U/L — ABNORMAL HIGH (ref 15–41)
Albumin: 2.2 g/dL — ABNORMAL LOW (ref 3.5–5.0)
Alkaline Phosphatase: 156 U/L — ABNORMAL HIGH (ref 38–126)
Anion gap: 8 (ref 5–15)
BUN: 28 mg/dL — ABNORMAL HIGH (ref 6–20)
CO2: 18 mmol/L — ABNORMAL LOW (ref 22–32)
Calcium: 7.9 mg/dL — ABNORMAL LOW (ref 8.9–10.3)
Chloride: 107 mmol/L (ref 98–111)
Creatinine, Ser: 2.45 mg/dL — ABNORMAL HIGH (ref 0.61–1.24)
GFR, Estimated: 30 mL/min — ABNORMAL LOW (ref >60)
Glucose, Bld: 107 mg/dL — ABNORMAL HIGH (ref 70–99)
Potassium: 3.8 mmol/L (ref 3.5–5.1)
Sodium: 133 mmol/L — ABNORMAL LOW (ref 135–145)
Total Bilirubin: 5.4 mg/dL — ABNORMAL HIGH (ref 0.3–1.2)
Total Protein: 6.2 g/dL — ABNORMAL LOW (ref 6.5–8.1)

## 2021-04-05 LAB — SODIUM, URINE, RANDOM: Sodium, Ur: <10 mmol/L

## 2021-04-05 LAB — CREATININE, URINE, RANDOM: Creatinine, Urine: 147.32 mg/dL

## 2021-04-05 MED ORDER — SODIUM CHLORIDE 0.9 % IV SOLN
100.0000 mg | Freq: Every day | INTRAVENOUS | Status: AC
  Administered 2021-04-05 – 2021-04-06 (×2): 100 mg via INTRAVENOUS
  Filled 2021-04-05: qty 20
  Filled 2021-04-05: qty 100

## 2021-04-05 MED ORDER — LIDOCAINE HCL (PF) 1 % IJ SOLN
INTRAMUSCULAR | Status: AC
  Filled 2021-04-05: qty 30

## 2021-04-05 MED ORDER — MIDODRINE HCL 5 MG PO TABS
5.0000 mg | ORAL_TABLET | Freq: Three times a day (TID) | ORAL | Status: DC
  Administered 2021-04-05 (×2): 5 mg via ORAL
  Filled 2021-04-05 (×2): qty 1

## 2021-04-05 MED ORDER — ALBUMIN HUMAN 25 % IV SOLN
25.0000 g | Freq: Four times a day (QID) | INTRAVENOUS | Status: DC
  Administered 2021-04-05 – 2021-04-09 (×17): 25 g via INTRAVENOUS
  Filled 2021-04-05 (×17): qty 100

## 2021-04-05 MED ORDER — PANTOPRAZOLE SODIUM 40 MG PO TBEC
40.0000 mg | DELAYED_RELEASE_TABLET | Freq: Two times a day (BID) | ORAL | Status: DC
  Administered 2021-04-05 – 2021-04-10 (×11): 40 mg via ORAL
  Filled 2021-04-05 (×11): qty 1

## 2021-04-05 MED ORDER — RIFAXIMIN 550 MG PO TABS
550.0000 mg | ORAL_TABLET | Freq: Two times a day (BID) | ORAL | Status: DC
  Administered 2021-04-05 – 2021-04-10 (×11): 550 mg via ORAL
  Filled 2021-04-05 (×11): qty 1

## 2021-04-05 MED ORDER — MIDODRINE HCL 5 MG PO TABS
10.0000 mg | ORAL_TABLET | Freq: Three times a day (TID) | ORAL | Status: DC
  Administered 2021-04-05 – 2021-04-10 (×15): 10 mg via ORAL
  Filled 2021-04-05 (×14): qty 2

## 2021-04-05 MED ORDER — VANCOMYCIN HCL 1250 MG/250ML IV SOLN
1250.0000 mg | Freq: Once | INTRAVENOUS | Status: AC
  Administered 2021-04-05: 1250 mg via INTRAVENOUS
  Filled 2021-04-05: qty 250

## 2021-04-05 MED ORDER — OCTREOTIDE ACETATE 100 MCG/ML IJ SOLN
200.0000 ug | Freq: Two times a day (BID) | INTRAMUSCULAR | Status: AC
  Administered 2021-04-05 – 2021-04-08 (×6): 200 ug via SUBCUTANEOUS
  Filled 2021-04-05 (×8): qty 2

## 2021-04-05 MED ORDER — LACTULOSE 10 GM/15ML PO SOLN
20.0000 g | Freq: Two times a day (BID) | ORAL | Status: DC
  Administered 2021-04-05 – 2021-04-08 (×6): 20 g via ORAL
  Filled 2021-04-05 (×10): qty 30

## 2021-04-05 NOTE — Progress Notes (Signed)
PROGRESS NOTE        PATIENT DETAILS Name: Kyle Pitts Age: 60 y.o. Sex: male Date of Birth: Aug 20, 1960 Admit Date: 04/04/2021 Admitting Physician Orene Desanctis, DO SQ:[id number], Provider, MD  Brief Narrative: Patient is a 60 y.o. male with history of EtOH cirrhosis with esophageal varices/portal hypertensive gastropathy-recent hospitalization (11/26-12/3) for upper GI bleeding with acute blood loss anemia and hepatorenal syndrome-presented to the ED on 12/16 with worsening abdominal distention and shortness of breath.  His last paracentesis was on 12/10 as an outpatient (6 L of acetic fluid was removed).  Patient was found to have very tense ascites-AKI-COVID-19 infection-and subsequently admitted to the hospitalist service.  See below for further details.   Subjective: Appears uncomfortable due to tense ascites-slightly tachypneic but not in any distress.  Objective: Vitals: Blood pressure (!) 130/92, pulse 73, temperature 98.5 F (36.9 C), temperature source Oral, resp. rate 20, height 6' (1.829 m), weight 100.8 kg, SpO2 97 %.   Exam: Gen Exam:Alert awake-not in any distress HEENT:atraumatic, normocephalic Chest: B/L clear to auscultation anteriorly CVS:S1S2 regular Abdomen: Soft-but tense ascites present-fluid thrill present. Extremities:+++ edema Neurology: Non focal Skin: no rash  Pertinent Labs/Radiology: Recent Labs  Lab 04/04/21 1558 04/05/21 0153  WBC 3.8* 5.0  HGB 9.6* 8.3*  PLT 97* 85*  NA 135 133*  K 3.6 3.8  CREATININE 2.46* 2.45*  AST 90* 83*  ALT 25 18  ALKPHOS 170* 156*  BILITOT 6.4* 5.4*    Assessment/Plan: Decompensated alcoholic liver cirrhosis with tense ascites: Scheduled for paracentesis earlier this morning-starting albumin-on empiric Rocephin for SBP prophylaxis.  Unable to use diuretics due to hepatorenal syndrome//AKI.  AKI: Concern for hepatorenal syndrome-not on diuretics-no history of vomiting/diarrhea/GI  bleeding-do not see any offending medications.  Starting IV albumin/midodrine-have consulted nephrology.  Recent UA on 11/26 with no proteinuria.  Recent CT scan on 11/26 without hydronephrosis/obstructive uropathy.  Avoid nephrotoxic agents-and follow renal function closely-we will await further recommendations from nephrology.  History of recent UGI bleeding due to gastric ulcers and acute blood loss anemia: Recent EGD on 11/30 showed multiple oozing gastric ulcers with an adherent clot.  Patient denies any history of hematochezia/melena/hematemesis.  Hemoglobin seems to be relatively stable-resume PPI twice daily dosing (8 weeks recommended per GI)-GI was planning to repeat second endoscopy in 8 weeks as outpatient.  Watch closely-follow CBC-if any signs of GI bleeding develops-we will need to reconsult GI.  Hepatic encephalopathy: Resume lactulose and rifaximin-he is awake and alert.  Alcoholic liver cirrhosis with portal hypertensive gastropathy/esophageal varices: Not on any beta blocking agents-given AKI-soft BP-we will hold off on starting for now.  Normocytic anemia: Multifactorial-due to recent blood loss-underlying chronic liver disease.  Watch closely.  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  Cytopenia:  Right pretibial wound with infection: Continue vancomycin/Rocephin-appreciate wound care evaluation.  COVID-19 infection: Unvaccinated-apart from cough-no other symptoms-likely a incidental finding-we will plan on Remdesivir x3 days.  CXR without pneumonia.  Obesity Estimated body mass index is 30.14 kg/m as calculated from the following:   Height as of this encounter: 6' (1.829 m).   Weight as of this encounter: 100.8 kg.    Procedures: None Consults: Renal DVT Prophylaxis: SCD's Code Status:Full code  Family Communication: None at bedside  Time spent: 35 minutes-Greater than 50% of this time was spent in counseling,  explanation of diagnosis, planning of further management, and coordination of care.   Disposition Plan: Status is: Inpatient  Remains inpatient appropriate because: Severe ascites-for paracentesis-AKI with likely hepatorenal syndrome-not yet stable for discharge.  Diet: Diet Order             Diet Heart Room service appropriate? Yes; Fluid consistency: Thin  Diet effective now                     Antimicrobial agents: Anti-infectives (From admission, onward)    Start     Dose/Rate Route Frequency Ordered Stop   04/05/21 1000  cefTRIAXone (ROCEPHIN) 2 g in sodium chloride 0.9 % 100 mL IVPB        2 g 200 mL/hr over 30 Minutes Intravenous Every 24 hours 04/04/21 1935 04/12/21 0959   04/05/21 1000  remdesivir 100 mg in sodium chloride 0.9 % 100 mL IVPB       See Hyperspace for full Linked Orders Report.   100 mg 200 mL/hr over 30 Minutes Intravenous Daily 04/04/21 1945 04/09/21 0959   04/04/21 2020  vancomycin variable dose per unstable renal function (pharmacist dosing)         Does not apply See admin instructions 04/04/21 2020     04/04/21 1945  vancomycin (VANCOREADY) IVPB 2000 mg/400 mL        2,000 mg 200 mL/hr over 120 Minutes Intravenous  Once 04/04/21 1935 04/05/21 0003   04/04/21 1945  remdesivir 200 mg in sodium chloride 0.9% 250 mL IVPB       See Hyperspace for full Linked Orders Report.   200 mg 580 mL/hr over 30 Minutes Intravenous Once 04/04/21 1945 04/05/21 0003   04/04/21 1815  cefTRIAXone (ROCEPHIN) 2 g in sodium chloride 0.9 % 100 mL IVPB        2 g 200 mL/hr over 30 Minutes Intravenous  Once 04/04/21 1804 04/04/21 2032        MEDICATIONS: Scheduled Meds:  lactulose  20 g Oral BID   lidocaine (PF)       midodrine  5 mg Oral TID WC   pantoprazole  40 mg Oral BID   vancomycin variable dose per unstable renal function (pharmacist dosing)   Does not apply See admin instructions   Continuous Infusions:  albumin human 25 g (04/05/21 0818)    cefTRIAXone (ROCEPHIN)  IV     remdesivir 100 mg in NS 100 mL     PRN Meds:.   I have personally reviewed following labs and imaging studies  LABORATORY DATA: CBC: Recent Labs  Lab 04/04/21 1558 04/05/21 0153  WBC 3.8* 5.0  NEUTROABS 2.3  --   HGB 9.6* 8.3*  HCT 28.2* 24.6*  MCV 99.6 99.2  PLT 97* 85*    Basic Metabolic Panel: Recent Labs  Lab 04/04/21 1558 04/05/21 0153  NA 135 133*  K 3.6 3.8  CL 107 107  CO2 19* 18*  GLUCOSE 113* 107*  BUN 29* 28*  CREATININE 2.46* 2.45*  CALCIUM 8.3* 7.9*    GFR: Estimated Creatinine Clearance: 39.9 mL/min (A) (by C-G formula based on SCr of 2.45 mg/dL (H)).  Liver Function Tests: Recent Labs  Lab 04/04/21 1558 04/05/21 0153  AST 90* 83*  ALT 25 18  ALKPHOS 170* 156*  BILITOT 6.4* 5.4*  PROT 6.9 6.2*  ALBUMIN 2.5* 2.2*   Recent Labs  Lab 04/04/21 1558  LIPASE 67*   Recent Labs  Lab 04/04/21 1558  AMMONIA 59*  Coagulation Profile: Recent Labs  Lab 04/04/21 1558  INR 1.5*    Cardiac Enzymes: No results for input(s): CKTOTAL, CKMB, CKMBINDEX, TROPONINI in the last 168 hours.  BNP (last 3 results) No results for input(s): PROBNP in the last 8760 hours.  Lipid Profile: No results for input(s): CHOL, HDL, LDLCALC, TRIG, CHOLHDL, LDLDIRECT in the last 72 hours.  Thyroid Function Tests: No results for input(s): TSH, T4TOTAL, FREET4, T3FREE, THYROIDAB in the last 72 hours.  Anemia Panel: No results for input(s): VITAMINB12, FOLATE, FERRITIN, TIBC, IRON, RETICCTPCT in the last 72 hours.  Urine analysis:    Component Value Date/Time   COLORURINE AMBER (A) 03/15/2021 1634   APPEARANCEUR HAZY (A) 03/15/2021 1634   LABSPEC 1.015 03/15/2021 1634   PHURINE 5.0 03/15/2021 1634   GLUCOSEU NEGATIVE 03/15/2021 1634   HGBUR NEGATIVE 03/15/2021 1634   BILIRUBINUR SMALL (A) 03/15/2021 1634   KETONESUR NEGATIVE 03/15/2021 1634   PROTEINUR NEGATIVE 03/15/2021 1634   NITRITE NEGATIVE 03/15/2021 1634    LEUKOCYTESUR NEGATIVE 03/15/2021 1634    Sepsis Labs: Lactic Acid, Venous    Component Value Date/Time   LATICACIDVEN 2.9 (HH) 11/07/2020 2335    MICROBIOLOGY: Recent Results (from the past 240 hour(s))  Resp Panel by RT-PCR (Flu A&B, Covid) Nasopharyngeal Swab     Status: Abnormal   Collection Time: 04/04/21  4:46 PM   Specimen: Nasopharyngeal Swab; Nasopharyngeal(NP) swabs in vial transport medium  Result Value Ref Range Status   SARS Coronavirus 2 by RT PCR POSITIVE (A) NEGATIVE Final    Comment: (NOTE) SARS-CoV-2 target nucleic acids are DETECTED.  The SARS-CoV-2 RNA is generally detectable in upper respiratory specimens during the acute phase of infection. Positive results are indicative of the presence of the identified virus, but do not rule out bacterial infection or co-infection with other pathogens not detected by the test. Clinical correlation with patient history and other diagnostic information is necessary to determine patient infection status. The expected result is Negative.  Fact Sheet for Patients: EntrepreneurPulse.com.au  Fact Sheet for Healthcare Providers: IncredibleEmployment.be  This test is not yet approved or cleared by the [place] FDA and  has been authorized for detection and/or diagnosis of SARS-CoV-2 by FDA under an Emergency Use Authorization (EUA).  This EUA will remain in effect (meaning this test can be used) for the duration of  the COVID-19 declaration under Section 564(b)(1) of the A ct, 21 U.S.C. section 360bbb-3(b)(1), unless the authorization is terminated or revoked sooner.     Influenza A by PCR NEGATIVE NEGATIVE Final   Influenza B by PCR NEGATIVE NEGATIVE Final    Comment: (NOTE) The Xpert Xpress SARS-CoV-2/FLU/RSV plus assay is intended as an aid in the diagnosis of influenza from Nasopharyngeal swab specimens and should not be used as a sole basis for treatment. Nasal washings  and aspirates are unacceptable for Xpert Xpress SARS-CoV-2/FLU/RSV testing.  Fact Sheet for Patients: EntrepreneurPulse.com.au  Fact Sheet for Healthcare Providers: IncredibleEmployment.be  This test is not yet approved or cleared by the [place] FDA and has been authorized for detection and/or diagnosis of SARS-CoV-2 by FDA under an Emergency Use Authorization (EUA). This EUA will remain in effect (meaning this test can be used) for the duration of the COVID-19 declaration under Section 564(b)(1) of the Act, 21 U.S.C. section 360bbb-3(b)(1), unless the authorization is terminated or revoked.  Performed at [facility], [place] [address], [place], [place] [address]     RADIOLOGY STUDIES/RESULTS: DG Chest [facility] [address]  Result Date: 04/04/2021 CLINICAL DATA:  Shortness of breath EXAM: PORTABLE CHEST 1 VIEW COMPARISON:  03/28/2021 FINDINGS: Low lung volumes with subsegmental atelectasis at the left base. No consolidation or effusion. Normal cardiac size IMPRESSION: Hypoventilatory changes with subsegmental atelectasis or scar at the left base Electronically Signed   By: Donavan Foil M.D.   On: 04/04/2021 17:01     LOS: 1 day   Oren Binet, MD  [facility]    To contact the attending provider between 7A-7P or the covering provider during after hours 7P-7A, please log into the web site [email or web address] and access using universal [place] password for that web site. If you do not have the password, please call the hospital operator.  04/05/2021, 9:02 AM

## 2021-04-05 NOTE — Procedures (Signed)
PROCEDURE SUMMARY:  Successful US guided paracentesis from right lateral abdomen.  Yielded 8.6 liters of clear yellow fluid.  No immediate complications.  Patient tolerated well.  EBL = trace  Gabrianna Fassnacht S Shacarra Choe PA-C 04/05/2021 11:10 AM

## 2021-04-05 NOTE — Progress Notes (Signed)
Pharmacy Antibiotic Note  Kyle Pitts is a 60 y.o. male admitted on 04/04/2021 with cellulitis.  Pharmacy has been consulted for vancomycin dosing.  Patient with a history of alcoholic cirrhosis, esophageal varices, gastric/duodenal ulcer, septic right knee . Patient presenting with SOB and abdominal ascites. Patient with noted  right LE pre-tibial wound cellulitis. Patient is also COVID positive (remdesivir course ordered)  SCr 2.45 - baseline is variable WBC 5.0; T 98.1 F  Plan: Vancomycin 1250 mg IV x1 24h after loading dose      eAUC 506.9, cmin 14.4 Trend WBC, Fever, Renal function, & Clinical course F/u cultures, clinical course, WBC, fever De-escalate when able  Height: 6' (182.9 cm) Weight: 100.8 kg (222 lb 3.6 oz) IBW/kg (Calculated) : 77.6  Temp (24hrs), Avg:98.2 F (36.8 C), Min:98 F (36.7 C), Max:98.5 F (36.9 C)  Recent Labs  Lab 04/04/21 1558 04/05/21 0153  WBC 3.8* 5.0  CREATININE 2.46* 2.45*     Estimated Creatinine Clearance: 39.9 mL/min (A) (by C-G formula based on SCr of 2.45 mg/dL (H)).    Allergies  Allergen Reactions   Latex Itching   Tape Itching   Amoxicillin Other (See Comments)    Itching around eyes and scalp on amoxicilin   Cephalexin Nausea Only    Upset stomach Ceftriaxone is ok   Penicillins Rash   Antimicrobials this admission: CTX 12/16>>(12/23) Vancomycin 12/16 >>  Remdesivir 12/16>>(12/19)  Microbiology results: 12/16 BCx: NGTD  Thank you for allowing pharmacy to be a part of this patients care.  Georgeann Oppenheim, PharmD Pharmacy Resident 04/05/2021, 2:57 PM

## 2021-04-05 NOTE — Plan of Care (Signed)

## 2021-04-05 NOTE — Consult Note (Signed)
WOC Nurse Consult Note: Consult is performed remotely after reviewing EHR including photo obtained in the ED yesterday and with assistance from the Bedside RN. Reason for Consult:Right pretibial wound.   Wound type: infectious Pressure Injury POA: N/A Measurement:To be obtained by the Bedside RN today and documented on the Nursing Flow Sheet.  (Requested via Secure Chat) Wound bed: Red, moist Drainage (amount, consistency, odor) yellow Periwound:mild erythema, no fluctuance or induration according to MD Dressing procedure/placement/frequency: I have implemented a POC consisting of twice daily cleansing followed by placement of a antimicrobial astringent and nonadherent dressing, xeroform. Additionally, guidance for PI prevention interventions (sacral foam, pressure off loading boots, turning and repositioning) are placed.  WOC nursing team will not follow, but will remain available to this patient, the nursing and medical teams.  Please re-consult if needed. Thanks, Ladona Mow, MSN, RN, GNP, [place]  Pager# [phone]

## 2021-04-05 NOTE — Consult Note (Addendum)
Renal Service Consult Note [facility]  Kyle Pitts 04/05/2021 [name], MD Requesting Physician: Dr. Sloan Leiter  Reason for Consult: Renal failure HPI: The patient is a 60 y.o. year-old w/ hx of cirrhosis, etoh abuse, hx gastric / duodenal ulcers, hx esoph varices, hx septic R knee presented to ED w/ c/o SOB and distended abdomen.  Had fever about 3 days ago.  Pt recently admitted in last November for decomp cirrhosis w/ AMS/ hepatic enceph. Pt EGD which found ulcers. In ED pt had creat 2.46 up from baseline 1.2- 1.4.  Tbili 5-6 range.  Also he was + for COVID infection. Also was noted to have RLE cellulitis and was started on IV abx.  Asked to see for renal failure.  Pt seen in room. Drank heavily "all my life", quit in October.  Worked in Industrial/product designer, traveled a bit.  Divorced since 2008.  Lives alone, although his sister recently moved in w/ him.  Family had 8 children, 3 of whom have ESRD and renal transplant, including sister that just moved in with him.    He states that he "just got back and they pulled off 8.5 L of fluid from my belly".   ROS - denies CP, no joint pain, no HA, no blurry vision, no rash, no diarrhea, no nausea/ vomiting, no dysuria, no difficulty voiding   Past Medical History  Past Medical History:  Diagnosis Date   Allergic reaction 11/21/2020   Cirrhosis ([place])    Hyponatremia    Myofasciitis 11/21/2020   Osteomyelitis of right leg ([place]) 11/21/2020   Septic arthritis ([place])    Streptococcus infection, group A 11/21/2020   Past Surgical History  Past Surgical History:  Procedure Laterality Date   BIOPSY  02/15/2021   Procedure: BIOPSY;  Surgeon: Daryel November, MD;  Location[facility] ENDOSCOPY;  Service: Gastroenterology;;   ESOPHAGOGASTRODUODENOSCOPY N/A 02/15/2021   Procedure: ESOPHAGOGASTRODUODENOSCOPY (EGD);  Surgeon: Daryel November, MD;  Location: [place];  Service: Gastroenterology;  Laterality: N/A;    ESOPHAGOGASTRODUODENOSCOPY (EGD) WITH PROPOFOL N/A 03/19/2021   Procedure: ESOPHAGOGASTRODUODENOSCOPY (EGD) WITH PROPOFOL;  Surgeon: Sharyn Creamer, MD;  Location: [place];  Service: Gastroenterology;  Laterality: N/A;   HEMOSTASIS CLIP PLACEMENT  03/19/2021   Procedure: HEMOSTASIS CLIP PLACEMENT;  Surgeon: Sharyn Creamer, MD;  Location: [facility] ENDOSCOPY;  Service: Gastroenterology;;   I & D EXTREMITY Right 10/09/2020   Procedure: IRRIGATION AND DEBRIDEMENT EXTREMITY;  Surgeon: Nicholes Stairs, MD;  Location: [facility];  Service: Orthopedics;  Laterality: Right;   IR PARACENTESIS  03/18/2021   KNEE ARTHROSCOPY Right 09/27/2020   Procedure: ARTHROSCOPY KNEE, [place] OUT;  Surgeon: Melina Schools, MD;  Location: [facility];  Service: Orthopedics;  Laterality: Right;   KNEE ARTHROSCOPY Right 10/09/2020   Procedure: ARTHROSCOPY KNEE,ARTHROSCOPIC I & D, CALF ABSCESS;  Surgeon: Nicholes Stairs, MD;  Location: [facility];  Service: Orthopedics;  Laterality: Right;   Family History  Family History  Problem Relation Age of Onset   Hepatitis Mother    Cirrhosis Mother    Stroke Father    Heart failure Father    Kidney failure Brother    Social History  reports that he has never smoked. He has never used smokeless tobacco. He reports current alcohol use of about 24.0 standard drinks per week. He reports that he does not currently use drugs after having used the following drugs: Cocaine and Marijuana. Allergies  Allergies  Allergen Reactions   Latex Itching  Tape Itching   Amoxicillin Other (See Comments)    Itching around eyes and scalp on amoxicilin   Cephalexin Nausea Only    Upset stomach Ceftriaxone is ok   Penicillins Rash   Home medications Prior to Admission medications   Medication Sig Start Date End Date Taking? Authorizing Provider  lactulose, encephalopathy, (CHRONULAC) 10 GM/15ML SOLN Take 30 mLs (20 g total) by mouth 2 (two) times daily. 03/21/21  Yes Vann, Jessica U, DO   midodrine (PROAMATINE) 5 MG tablet Take 1 tablet (5 mg total) by mouth 3 (three) times daily with meals. 03/21/21  Yes Vann, Jessica U, DO  pantoprazole (PROTONIX) 40 MG tablet Take 1 tablet (40 mg total) by mouth 2 (two) times daily. 03/21/21 05/20/21 Yes Vann, Tomi Bamberger, DO     Vitals:   04/05/21 0935 04/05/21 0940 04/05/21 0950 04/05/21 1227  BP: 111/71 103/62 119/71 (!) 104/47  Pulse:    63  Resp:    19  Temp:    98.1 F (36.7 C)  TempSrc:    Oral  SpO2:      Weight:      Height:       Exam Gen alert, no distress, pleasant No rash, cyanosis or gangrene Sclera anicteric, throat clear  No jvd or bruits Chest clear bilat to bases, no rales/ wheezing RRR no MRG Abd soft ntnd no mass or ascites +bs GU normal male MS R shin bandaged, no joint effusions or deformity Ext 1+ pretib edema bilat, no wounds or ulcers Neuro is alert, Ox 3 , nf, no asterixis    Home meds include - midodrine 5 tid, lactulose 20 gm bid, prontoix 40 bid     Date    Creat   eGFR   2009- 2012   0.8- 0.26 September 2020   0.98 >> 0.58   July -Aug 2022  0.37- 0.52 > 60 ml/min   Oct 26- 30, 2022  2.69 >> 0.85 27 > greater than 60 ml/min   Nov 26- Mar 21, 2021 4.69 >> 1.69 14 >> 46 ml/min   Dec 8- 9, 2022  1.27- 1.39 58- 62 ml/min   Dec 16   2.46  29   Apr 05, 2021   2.45  30           Mar 15 2021 > UA negative, UNa <10, UCr 301   UA pending    UNa, UCr pending     CXR 12/16 - IMPRESSION: Hypoventilatory changes with subsegmental atelectasis or scar at the left base      US paracentesis - IMPRESSION: Successful ultrasound-guided paracentesis yielding 8.6 liters of peritoneal fluid.       Na 133 K 3.8  CO2 18  BUN 28  Creat 2.45  Alb 2.2  AST 83 ALT 18         NH3 59 tbili 5.4   eGFR 30 ml/min        WBC 5K  Hb 8.3  plt 85k   INR 1.5            COVID -19 +         BP's here 113- 1.35/ 75-90   HR 65-75, RR 16- 28  afeb  on RA 99%        I/o since admit = 850 cc in and 550 cc out = +300  cc   Assessment/ Plan: AKI on CKD 3a - b/l creat 1.2- 1.4 from early Dec 2022,  eGFR 58- 62 ml/min. Pt admitted for worsening ascites and found to have decomp liver cirrhosis. SP LVP today of 8.5 L.  Pt is not hypotensive. Started on lactulose for hepatic encephalopathy. UA is pending and UNa/ UCr are pending. Slight edema in LE's, no other edema and o/w euvolemic. Prior admits this fall his urine lytes suggested HRS and his renal function improved (26 > 0.8, 4.6 > 1.6). 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 Agree w/ IV albumin. Will ^midodrine to 10 tid and add octreotide SQ for 2-3 days. Get renal US. Strict I/O's. Poor overall prognosis. Will follow.  COVID + infection - per pmd Cirrhosis / recurrent ascites - d/t etoh abuse, decompensated. Getting lactulose and had LVP today 8.5 L.  H/o duodenal/ gastric ulcers - by EGD prior admission Anemia/ thrombocytopenia - prob related to cirrhosis/ liver failure ETOH abuse - states he quit in October      Kelly Splinter  MD 04/05/2021, 1:38 PM  Recent Labs  Lab 04/04/21 1558 04/05/21 0153  WBC 3.8* 5.0  HGB 9.6* 8.3*   Recent Labs  Lab 04/04/21 1558 04/05/21 0153  K 3.6 3.8  BUN 29* 28*  CREATININE 2.46* 2.45*  CALCIUM 8.3* 7.9*

## 2021-04-06 ENCOUNTER — Inpatient Hospital Stay (HOSPITAL_COMMUNITY): Payer: Medicaid Other

## 2021-04-06 DIAGNOSIS — K7031 Alcoholic cirrhosis of liver with ascites: Principal | ICD-10-CM

## 2021-04-06 LAB — CBC
HCT: 26.3 % — ABNORMAL LOW (ref 39.0–52.0)
Hemoglobin: 8.5 g/dL — ABNORMAL LOW (ref 13.0–17.0)
MCH: 33.5 pg (ref 26.0–34.0)
MCHC: 32.3 g/dL (ref 30.0–36.0)
MCV: 103.5 fL — ABNORMAL HIGH (ref 80.0–100.0)
Platelets: 58 10*3/uL — ABNORMAL LOW (ref 150–400)
RBC: 2.54 MIL/uL — ABNORMAL LOW (ref 4.22–5.81)
RDW: 19.2 % — ABNORMAL HIGH (ref 11.5–15.5)
WBC: 3.2 10*3/uL — ABNORMAL LOW (ref 4.0–10.5)
nRBC: 0 % (ref 0.0–0.2)

## 2021-04-06 LAB — COMPREHENSIVE METABOLIC PANEL
ALT: 15 U/L (ref 0–44)
AST: 71 U/L — ABNORMAL HIGH (ref 15–41)
Albumin: 2.6 g/dL — ABNORMAL LOW (ref 3.5–5.0)
Alkaline Phosphatase: 128 U/L — ABNORMAL HIGH (ref 38–126)
Anion gap: 7 (ref 5–15)
BUN: 21 mg/dL — ABNORMAL HIGH (ref 6–20)
CO2: 18 mmol/L — ABNORMAL LOW (ref 22–32)
Calcium: 7.8 mg/dL — ABNORMAL LOW (ref 8.9–10.3)
Chloride: 109 mmol/L (ref 98–111)
Creatinine, Ser: 1.98 mg/dL — ABNORMAL HIGH (ref 0.61–1.24)
GFR, Estimated: 38 mL/min — ABNORMAL LOW (ref >60)
Glucose, Bld: 140 mg/dL — ABNORMAL HIGH (ref 70–99)
Potassium: 3.8 mmol/L (ref 3.5–5.1)
Sodium: 134 mmol/L — ABNORMAL LOW (ref 135–145)
Total Bilirubin: 4.2 mg/dL — ABNORMAL HIGH (ref 0.3–1.2)
Total Protein: 6 g/dL — ABNORMAL LOW (ref 6.5–8.1)

## 2021-04-06 LAB — C-REACTIVE PROTEIN: CRP: 2.6 mg/dL — ABNORMAL HIGH (ref <1.0)

## 2021-04-06 LAB — D-DIMER, QUANTITATIVE: D-Dimer, Quant: 4.11 ug/mL-FEU — ABNORMAL HIGH (ref 0.00–0.50)

## 2021-04-06 LAB — MRSA NEXT GEN BY PCR, NASAL: MRSA by PCR Next Gen: DETECTED — AB

## 2021-04-06 MED ORDER — VANCOMYCIN HCL 1500 MG/300ML IV SOLN
1500.0000 mg | Freq: Once | INTRAVENOUS | Status: AC
  Administered 2021-04-06: 21:00:00 1500 mg via INTRAVENOUS
  Filled 2021-04-06: qty 300

## 2021-04-06 NOTE — Progress Notes (Signed)
[facility] Progress Note  Subjective: UOP 550 cc yest and 325 cc today.  Creat down   Vitals:   04/05/21 1610 04/05/21 2025 04/06/21 0141 04/06/21 0400  BP: 109/67 101/63 107/69 111/70  Pulse: 63 60 63 60  Resp: 17 18 19 20   Temp: 98.1 F (36.7 C) 98 F (36.7 C) 98.5 F (36.9 C) 98.8 F (37.1 C)  TempSrc: Oral Oral Oral Oral  SpO2: 97% 94% 92% 95%  Weight:      Height:        Exam:  alert, nad   no jvd  Chest cta bilat  Cor reg no RG  Abd soft ntnd no ascites   Ext 1+ bilat LE edema   Alert, NF, ox3    Home meds include - midodrine 5 tid, lactulose 20 gm bid, prontoix 40 bid      Date                                      Creat               eGFR   2009- 2012                            0.8- 0.26 September 2020                             0.98 >> 0.58   July -Aug 2022                      0.37- 0.52        > 60 ml/min   Oct 26- 30, 2022                   2.69 >> 0.85    27 > greater than 60 ml/min   Nov 26- Mar 21, 2021           4.69 >> 1.69    14 >> 46 ml/min   Dec 8- 9, 2022                      1.27- 1.39        58- 62 ml/min   Dec 16                                   2.46                 29   Apr 05, 2021                        2.45                 30               Mar 15 2021 > UA negative   UA pending    UNa < 10, UR 147     CXR 12/16 - IMPRESSION: Hypoventilatory changes with atelectasis L base...      US paracentesis - IMPRESSION: Successful ultrasound-guided paracentesis yielding 8.6 liters  of peritoneal fluid.       Renal US - 12-14 cm kidneys w/o hydro, nl echo       Na 133 K 3.8  CO2 18  BUN 28  Creat 2.45  Alb 2.2  AST 83 ALT 18         NH3 59 tbili 5.4   eGFR 30 ml/min        WBC 5K  Hb 8.3  plt 85k   INR 1.5            COVID -19 +         BP's here 113- 1.35/ 75-90   HR 65-75, RR 16- 28  afeb  on RA 99%        I/o since admit = 850 cc in and 550 cc out = +300 cc     Assessment/ Plan: AKI on CKD 3a - b/l creat  1.2- 1.4 from early Dec 2022, eGFR 58- 62 ml/min. Pt admitted for worsening ascites and found to have decomp liver cirrhosis. SP LVP here of 8.5 L on 12/17.  Not hypotensive. Started on lactulose for hepatic encephalopathy. UNa <10 and UCr high.  Renal US is wnl. UA pending. Slight edema in LE's, no other edema and o/w euvolemic. On 2 prior admissions this fall his presentation suggested HRS type 1 and renal fxn improved significantly on both admits (26 > 0.8, 4.6 > 1.6). 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 Pt is getting IV albumin, midodrine 10 tid and SQ octreotide as HRS protocol. Creat down 1.9 today, cont protocol.  Will follow.  COVID + infection - per pmd Cirrhosis / recurrent ascites - d/t etoh abuse, decompensated. Getting lactulose and sp LVP 8.5 L on 12/17.  H/o duodenal/ gastric ulcers - by EGD prior admission Anemia/ thrombocytopenia - prob related to cirrhosis/ liver failure ETOH abuse - for many years. States he quit in October.         Rob Jabre Heo 04/06/2021, 8:15 AM   Recent Labs  Lab 04/05/21 0153 04/06/21 0205  K 3.8 3.8  BUN 28* 21*  CREATININE 2.45* 1.98*  CALCIUM 7.9* 7.8*  HGB 8.3* 8.5*   Inpatient medications:  lactulose  20 g Oral BID   midodrine  10 mg Oral TID WC   octreotide  200 mcg Subcutaneous Q12H   pantoprazole  40 mg Oral BID   rifaximin  550 mg Oral BID   vancomycin variable dose per unstable renal function (pharmacist dosing)   Does not apply See admin instructions    albumin human 25 g (04/06/21 0300)   cefTRIAXone (ROCEPHIN)  IV 2 g (04/05/21 1038)   remdesivir 100 mg in NS 100 mL 100 mg (04/05/21 1133)

## 2021-04-06 NOTE — Progress Notes (Signed)
PROGRESS NOTE        PATIENT DETAILS Name: Kyle Pitts Age: 60 y.o. Sex: male Date of Birth: 1961/01/19 Admit Date: 04/04/2021 Admitting Physician Orene Desanctis, DO SQ:[id number], Provider, MD  Brief Narrative: Patient is a 60 y.o. male with history of EtOH cirrhosis with esophageal varices/portal hypertensive gastropathy-recent hospitalization (11/26-12/3) for upper GI bleeding with acute blood loss anemia and hepatorenal syndrome-presented to the ED on 12/16 with worsening abdominal distention and shortness of breath.  His last paracentesis was on 12/10 as an outpatient (6 L of acetic fluid was removed).  Patient was found to have very tense ascites-AKI-COVID-19 infection-and subsequently admitted to the hospitalist service.  See below for further details.   Subjective:  Patient in bed, appears comfortable, denies any headache, no fever, no chest pain or pressure, no shortness of breath , no abdominal pain. No focal weakness.  Objective: Vitals: Blood pressure (!) 100/52, pulse (!) 55, temperature 98.3 F (36.8 C), temperature source Oral, resp. rate 18, height 6' (1.829 m), weight 100.8 kg, SpO2 95 %.   Exam:  Awake Alert, No new F.N deficits, Normal affect [place].AT,PERRAL Supple Neck, No JVD,   Symmetrical Chest wall movement, Good air movement bilaterally, CTAB RRR,No Gallops, Rubs or new Murmurs,  +ve B.Sounds, Abd distended but nontender, 2+ edema, right lower extremity mild cellulitis   Pertinent Labs/Radiology: Recent Labs  Lab 04/04/21 1558 04/05/21 0153 04/06/21 0205  WBC 3.8* 5.0 3.2*  HGB 9.6* 8.3* 8.5*  PLT 97* 85* 58*  NA 135 133* 134*  K 3.6 3.8 3.8  CREATININE 2.46* 2.45* 1.98*  AST 90* 83* 71*  ALT 25 18 15   ALKPHOS 170* 156* 128*  BILITOT 6.4* 5.4* 4.2*    Assessment/Plan: Decompensated alcoholic liver cirrhosis with tense ascites: Ultrasound-guided paracentesis on 04/05/2021 with 8 L of fluid removed-continue on albumin-on  empiric Rocephin for SBP prophylaxis.  Unable to use diuretics due to hepatorenal syndrome//AKI.  AKI: Concern for hepatorenal syndrome-not on diuretics-no history of vomiting/diarrhea/GI bleeding-do not see any offending medications.  Starting IV albumin/midodrine-have consulted nephrology.  Recent UA on 11/26 with no proteinuria.  Recent CT scan on 11/26 without hydronephrosis/obstructive uropathy.  Avoid nephrotoxic agents-and follow renal function closely-we will await further recommendations from nephrology.  History of recent UGI bleeding due to gastric ulcers and acute blood loss anemia: Recent EGD on 11/30 showed multiple oozing gastric ulcers with an adherent clot.  Patient denies any history of hematochezia/melena/hematemesis.  Hemoglobin seems to be relatively stable-resume PPI twice daily dosing (8 weeks recommended per GI)-GI was planning to repeat second endoscopy in 8 weeks as outpatient.  Watch closely-follow CBC-if any signs of GI bleeding develops-we will need to reconsult GI.  Hepatic encephalopathy: Resumed lactulose and rifaximin-resolved.  Alcoholic liver cirrhosis with portal hypertensive gastropathy/esophageal varices: Not on any beta blocking agents-given AKI-soft BP-we will hold off on starting for now.  Normocytic anemia: Multifactorial-due to recent blood loss-underlying chronic liver disease.  Watch closely.  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  Cytopenia:  Right pretibial wound with infection: Continue vancomycin/Rocephin-appreciate wound care evaluation.  COVID-19 infection: Unvaccinated-apart from cough-no other symptoms-likely a incidental finding-we will plan on Remdesivir x3 days.  CXR without pneumonia.  Obesity Estimated body mass index is 30.14 kg/m as calculated from the following:   Height as of this encounter: 6' (1.829  m).   Weight as of this encounter: 100.8 kg.    Procedures: None Consults:  Renal DVT Prophylaxis: SCD's Code Status:Full code  Family Communication: None at bedside  Time spent: 35 minutes-Greater than 50% of this time was spent in counseling, explanation of diagnosis, planning of further management, and coordination of care.   Disposition Plan: Status is: Inpatient  Remains inpatient appropriate because: Severe ascites-for paracentesis-AKI with likely hepatorenal syndrome-not yet stable for discharge.  Diet: Diet Order             Diet Heart Room service appropriate? Yes; Fluid consistency: Thin  Diet effective now                     Antimicrobial agents: Anti-infectives (From admission, onward)    Start     Dose/Rate Route Frequency Ordered Stop   04/05/21 2000  vancomycin (VANCOREADY) IVPB 1250 mg/250 mL        1,250 mg 166.7 mL/hr over 90 Minutes Intravenous  Once 04/05/21 1455 04/05/21 2321   04/05/21 1015  rifaximin (XIFAXAN) tablet 550 mg        550 mg Oral 2 times daily 04/05/21 0916     04/05/21 1000  cefTRIAXone (ROCEPHIN) 2 g in sodium chloride 0.9 % 100 mL IVPB        2 g 200 mL/hr over 30 Minutes Intravenous Every 24 hours 04/04/21 1935 04/11/21 0959   04/05/21 1000  remdesivir 100 mg in sodium chloride 0.9 % 100 mL IVPB  Status:  Discontinued       See Hyperspace for full Linked Orders Report.   100 mg 200 mL/hr over 30 Minutes Intravenous Daily 04/04/21 1945 04/05/21 0919   04/05/21 1000  remdesivir 100 mg in sodium chloride 0.9 % 100 mL IVPB       See Hyperspace for full Linked Orders Report.   100 mg 200 mL/hr over 30 Minutes Intravenous Daily 04/05/21 0919 04/06/21 1022   04/04/21 2020  vancomycin variable dose per unstable renal function (pharmacist dosing)         Does not apply See admin instructions 04/04/21 2020     04/04/21 1945  vancomycin (VANCOREADY) IVPB 2000 mg/400 mL        2,000 mg 200 mL/hr over 120 Minutes Intravenous  Once 04/04/21 1935 04/05/21 0003   04/04/21 1945  remdesivir 200 mg in sodium chloride  0.9% 250 mL IVPB       See Hyperspace for full Linked Orders Report.   200 mg 580 mL/hr over 30 Minutes Intravenous Once 04/04/21 1945 04/05/21 0003   04/04/21 1815  cefTRIAXone (ROCEPHIN) 2 g in sodium chloride 0.9 % 100 mL IVPB        2 g 200 mL/hr over 30 Minutes Intravenous  Once 04/04/21 1804 04/04/21 2032        MEDICATIONS: Scheduled Meds:  lactulose  20 g Oral BID   midodrine  10 mg Oral TID WC   octreotide  200 mcg Subcutaneous Q12H   pantoprazole  40 mg Oral BID   rifaximin  550 mg Oral BID   vancomycin variable dose per unstable renal function (pharmacist dosing)   Does not apply See admin instructions   Continuous Infusions:  albumin human 25 g (04/06/21 0858)   cefTRIAXone (ROCEPHIN)  IV 2 g (04/06/21 0904)   PRN Meds:.   I have personally reviewed following labs and imaging studies  LABORATORY DATA:  Recent Labs  Lab 04/04/21 1558 04/05/21 0153 04/06/21  0205  WBC 3.8* 5.0 3.2*  HGB 9.6* 8.3* 8.5*  HCT 28.2* 24.6* 26.3*  PLT 97* 85* 58*  MCV 99.6 99.2 103.5*  MCH 33.9 33.5 33.5  MCHC 34.0 33.7 32.3  RDW 19.1* 18.7* 19.2*  LYMPHSABS 1.1  --   --   MONOABS 0.4  --   --   EOSABS 0.0  --   --   BASOSABS 0.0  --   --     Recent Labs  Lab 04/04/21 1558 04/05/21 0153 04/06/21 0205 04/06/21 0823  NA 135 133* 134*  --   K 3.6 3.8 3.8  --   CL 107 107 109  --   CO2 19* 18* 18*  --   GLUCOSE 113* 107* 140*  --   BUN 29* 28* 21*  --   CREATININE 2.46* 2.45* 1.98*  --   CALCIUM 8.3* 7.9* 7.8*  --   AST 90* 83* 71*  --   ALT 25 18 15   --   ALKPHOS 170* 156* 128*  --   BILITOT 6.4* 5.4* 4.2*  --   ALBUMIN 2.5* 2.2* 2.6*  --   CRP  --   --   --  2.6*  DDIMER  --   --   --  4.11*  INR 1.5*  --   --   --   AMMONIA 59*  --   --   --   BNP 97.6  --   --   --           RADIOLOGY STUDIES/RESULTS: US RENAL  Result Date: 04/05/2021 CLINICAL DATA:  Acute renal failure EXAM: RENAL / URINARY TRACT ULTRASOUND COMPLETE COMPARISON:  CT abdomen and  pelvis 03/15/2021. FINDINGS: Right Kidney: Renal measurements: 13.8 x 5.7 x 5.7 cm = volume: 235 mL. Echogenicity within normal limits. No mass or hydronephrosis visualized. Left Kidney: Renal measurements: 12.4 x 6.2 x 5.0 cm = volume: 202 mL. Echogenicity within normal limits. No mass or hydronephrosis visualized. Bladder: Appears normal for degree of bladder distention. Other: None. IMPRESSION: Normal renal ultrasound. Electronically Signed   By: Ronney Asters M.D.   On: 04/05/2021 15:34   US Paracentesis  Result Date: 04/05/2021 INDICATION: Recurrent ascites secondary to alcoholic cirrhosis. Request for therapeutic paracentesis. EXAM: ULTRASOUND GUIDED PARACENTESIS MEDICATIONS: 1% lidocaine 10 mL COMPLICATIONS: None immediate. PROCEDURE: Informed written consent was obtained from the patient after a discussion of the risks, benefits and alternatives to treatment. A timeout was performed prior to the initiation of the procedure. Initial ultrasound scanning demonstrates a large amount of ascites within the right lateral abdomen. The right lateral abdomen was prepped and draped in the usual sterile fashion. 1% lidocaine was used for local anesthesia. Following this, a 19 gauge, 7-cm, Yueh catheter was introduced. An ultrasound image was saved for documentation purposes. The paracentesis was performed. The catheter was removed and a dressing was applied. The patient tolerated the procedure well without immediate post procedural complication. Patient received post-procedure intravenous albumin; see nursing notes for details. FINDINGS: A total of approximately 8.6 L of clear yellow fluid was removed. IMPRESSION: Successful ultrasound-guided paracentesis yielding 8.6 liters of peritoneal fluid. Read by: Gareth Eagle, PA-C Electronically Signed   By: Sandi Mariscal M.D.   On: 04/05/2021 11:35   DG Chest Port 1 View  Result Date: 04/06/2021 CLINICAL DATA:  Shortness of breath. EXAM: PORTABLE CHEST 1 VIEW  COMPARISON:  Chest radiograph 04/04/2021 FINDINGS: The heart size and mediastinal contours are within normal limits. Improved aeration  of the left lung base compared to 04/04/2021. No new focal consolidation, pleural effusion, or pneumothorax. Multilevel degenerative changes in the mid to distal thoracic spine. IMPRESSION: Improved aeration at the left lung base compared to prior exam. No new focal consolidation or pleural effusion. Electronically Signed   By: Ileana Roup M.D.   On: 04/06/2021 09:02   DG Chest Port 1 View  Result Date: 04/04/2021 CLINICAL DATA:  Shortness of breath EXAM: PORTABLE CHEST 1 VIEW COMPARISON:  03/28/2021 FINDINGS: Low lung volumes with subsegmental atelectasis at the left base. No consolidation or effusion. Normal cardiac size IMPRESSION: Hypoventilatory changes with subsegmental atelectasis or scar at the left base Electronically Signed   By: Donavan Foil M.D.   On: 04/04/2021 17:01     LOS: 2 days   Signature  Lala Lund M.D on 04/06/2021 at 10:44 AM   -  To page go to [email or web address]

## 2021-04-06 NOTE — Evaluation (Signed)
Occupational Therapy Evaluation Patient Details Name: Kyle Pitts MRN: [id number] DOB: 1960-05-03 Today's Date: 04/06/2021   History of Present Illness The patient is a 60 y.o. year-old w/ hx of cirrhosis, etoh abuse, hx gastric / duodenal ulcers, hx esoph varices, hx septic R knee presented to ED w/ c/o SOB and distended abdomen. Pt found to be COVID+ and noted to have RLE cellulitis. 12/17 paracentesis removing 8.6 liters.   Clinical Impression   PTA, pt was living at home alone, pt reports his sister has been staying with him recently.  Pt reports he was independent with ADL/IADL and functional mobility without AD. Pt currently requires supervision to minguard assistance for ADL and functional mobility with use of RW and IV pole. Pt with 1x loss of balance, appeared to be weakness in R knee. Pt reports his knee sx was in June. Pt on RA during session, SpO2 at rest 95% RA, with exertion SpO2 in the 80s. Accuracy was limited due to pulse ox reading poor and reading was different on various fingers. Communicated with RN. Due to decline in current level of function, pt would benefit from acute OT to address established goals to facilitate safe D/C to venue listed below. At this time, no OT follow-up recommended. Will continue to follow acutely.       Recommendations for follow up therapy are one component of a multi-disciplinary discharge planning process, led by the attending physician.  Recommendations may be updated based on patient status, additional functional criteria and insurance authorization.   Follow Up Recommendations  No OT follow up    Assistance Recommended at Discharge Intermittent Supervision/Assistance  Functional Status Assessment  Patient has had a recent decline in their functional status and demonstrates the ability to make significant improvements in function in a reasonable and predictable amount of time.  Equipment Recommendations  None recommended by OT     Recommendations for Other Services PT consult     Precautions / Restrictions Precautions Precautions: Fall Precaution Comments: air/con precautions Restrictions Weight Bearing Restrictions: No      Mobility Bed Mobility Overal bed mobility: Independent                  Transfers Overall transfer level: Needs assistance Equipment used: Rolling walker (2 wheels);None Transfers: Sit to/from Stand Sit to Stand: Min guard           General transfer comment: minguard for initial sit<>stand pt with loss of balance and required assistance from therapist to correct      Balance Overall balance assessment: Needs assistance Sitting-balance support: No upper extremity supported;Feet supported Sitting balance-Leahy Scale: Good     Standing balance support: Single extremity supported;During functional activity Standing balance-Leahy Scale: Good Standing balance comment: 1x loss of balance requiring assistance, mild instability without RW. Pt pushing IV pole.                           ADL either performed or assessed with clinical judgement   ADL Overall ADL's : Needs assistance/impaired Eating/Feeding: Independent   Grooming: Supervision/safety;Standing Grooming Details (indicate cue type and reason): supervision for ECS Upper Body Bathing: Independent   Lower Body Bathing: Min guard   Upper Body Dressing : Independent   Lower Body Dressing: Minimal assistance Lower Body Dressing Details (indicate cue type and reason): assist with donning R sock, pt reports difficulty with figure-4 due to precious R knee sx Toilet Transfer: Min guard;Ambulation Toilet Transfer Details (indicate  cue type and reason): minguard for safety Toileting- Clothing Manipulation and Hygiene: Modified independent       Functional mobility during ADLs: Min guard General ADL Comments: ambulated in room with and without RW;pt demonstrated improved stability with RW. Pt with 1x  minor LOB with initial standing     Vision         Perception     Praxis      Pertinent Vitals/Pain Pain Assessment: No/denies pain     Hand Dominance Right   Extremity/Trunk Assessment Upper Extremity Assessment Upper Extremity Assessment: Overall WFL for tasks assessed   Lower Extremity Assessment Lower Extremity Assessment: Overall WFL for tasks assessed   Cervical / Trunk Assessment Cervical / Trunk Assessment: Normal   Communication Communication Communication: No difficulties   Cognition Arousal/Alertness: Awake/alert Behavior During Therapy: WFL for tasks assessed/performed Overall Cognitive Status: Within Functional Limits for tasks assessed                                       General Comments  SpO2 RA 95% at rest, Pt desated to 87% RA following in room mobility about 81feet    Exercises Exercises: Other exercises Other Exercises Other Exercises: educated pt on energy conservation strategies with ADL/IADL and functional mobility Other Exercises: educated pt on activity tolerance and activity progression   Shoulder Instructions      Home Living Family/patient expects to be discharged to:: Private residence Living Arrangements: Alone Available Help at Discharge: Family;Available PRN/intermittently Type of Home: House Home Access: Stairs to enter [employer] of Steps: 5   Home Layout: One level     Bathroom Shower/Tub: Chief Strategy Officer: Standard     Home Equipment: Cane - single point;BSC/3in1;Shower seat   Additional Comments: sister is living with the pt, potentially moving in. Pt reports sister is not in good health, but she does not need any assistance      Prior Functioning/Environment Prior Level of Function : Independent/Modified Independent;Driving             Mobility Comments: independent without AD; no falls ADLs Comments: independent with all ADL        OT Problem List:  Decreased activity tolerance;Impaired balance (sitting and/or standing);Cardiopulmonary status limiting activity      OT Treatment/Interventions: Self-care/ADL training;Energy conservation;Patient/family education;Balance training    OT Goals(Current goals can be found in the care plan section) Acute Rehab OT Goals Patient Stated Goal: to go home OT Goal Formulation: With patient Time For Goal Achievement: 04/20/21 Potential to Achieve Goals: Good ADL Goals Pt Will Perform Lower Body Dressing: with modified independence;sit to/from stand Pt Will Transfer to Toilet: with modified independence;ambulating Additional ADL Goal #1: Pt will demonstrated independence with 3 energy conservation strategies during ADL completion.  OT Frequency: Min 2X/week   Barriers to D/C:            Co-evaluation              AM-PAC OT "6 Clicks" Daily Activity     Outcome Measure Help from another person eating meals?: None Help from another person taking care of personal grooming?: A Little Help from another person toileting, which includes using toliet, bedpan, or urinal?: A Little Help from another person bathing (including washing, rinsing, drying)?: None Help from another person to put on and taking off regular upper body clothing?: None Help from another person to  put on and taking off regular lower body clothing?: A Little 6 Click Score: 21   End of Session Equipment Utilized During Treatment: Rolling walker (2 wheels) Nurse Communication: Mobility status  Activity Tolerance: Patient tolerated treatment well Patient left: in chair;with call bell/phone within reach;with chair alarm set  OT Visit Diagnosis: Other abnormalities of gait and mobility (R26.89)                Time: 0932-6712 OT Time Calculation (min): 45 min Charges:  OT General Charges $OT Visit: 1 Visit OT Evaluation $OT Eval Moderate Complexity: 1 Mod OT Treatments $Self Care/Home Management : 23-37 mins  Rosey Bath  OTR/L Acute Rehabilitation Services Office: [phone]   Rebeca Alert 04/06/2021, 1:10 PM

## 2021-04-06 NOTE — Progress Notes (Signed)
Pharmacy Antibiotic Note  Kyle Pitts is a 60 y.o. male admitted on 04/04/2021 with cellulitis.  Pharmacy has been consulted for vancomycin dosing.  Patient with a history of alcoholic cirrhosis, esophageal varices, gastric/duodenal ulcer, septic right knee . Patient presenting with SOB and abdominal ascites. Patient with noted  right LE pre-tibial wound cellulitis. Patient is also COVID positive (remdesivir course ordered). Afebrile with normal WBC. SCr down to 1.98 today, baseline is variable.  Plan: Vancomycin 1500 mg IV x1       eAUC 504.2, cmin 12.3 with 1500 q24h dosing Re-calculate dose in 24 hours Trend WBC, Fever, Renal function, & Clinical course F/u cultures, clinical course, WBC, fever De-escalate when able  Height: 6' (182.9 cm) Weight: 100.8 kg (222 lb 3.6 oz) IBW/kg (Calculated) : 77.6  Temp (24hrs), Avg:98.3 F (36.8 C), Min:98 F (36.7 C), Max:98.8 F (37.1 C)  Recent Labs  Lab 04/04/21 1558 04/05/21 0153 04/06/21 0205  WBC 3.8* 5.0 3.2*  CREATININE 2.46* 2.45* 1.98*     Estimated Creatinine Clearance: 49.4 mL/min (A) (by C-G formula based on SCr of 1.98 mg/dL (H)).    Allergies  Allergen Reactions   Latex Itching   Tape Itching   Amoxicillin Other (See Comments)    Itching around eyes and scalp on amoxicilin   Cephalexin Nausea Only    Upset stomach Ceftriaxone is ok   Penicillins Rash   Antimicrobials this admission: CTX 12/16>>(12/23) Vancomycin 12/16 >>  Remdesivir 12/16>>(12/19)  Microbiology results: 12/16 BCx: NGTD  Thank you for allowing pharmacy to be a part of this patients care.  Georgeann Oppenheim, PharmD Pharmacy Resident 04/06/2021, 12:57 PM

## 2021-04-07 LAB — BRAIN NATRIURETIC PEPTIDE: B Natriuretic Peptide: 632.7 pg/mL — ABNORMAL HIGH (ref 0.0–100.0)

## 2021-04-07 LAB — COMPREHENSIVE METABOLIC PANEL
ALT: 14 U/L (ref 0–44)
AST: 51 U/L — ABNORMAL HIGH (ref 15–41)
Albumin: 2.9 g/dL — ABNORMAL LOW (ref 3.5–5.0)
Alkaline Phosphatase: 101 U/L (ref 38–126)
Anion gap: 7 (ref 5–15)
BUN: 16 mg/dL (ref 6–20)
CO2: 21 mmol/L — ABNORMAL LOW (ref 22–32)
Calcium: 7.9 mg/dL — ABNORMAL LOW (ref 8.9–10.3)
Chloride: 106 mmol/L (ref 98–111)
Creatinine, Ser: 1.54 mg/dL — ABNORMAL HIGH (ref 0.61–1.24)
GFR, Estimated: 52 mL/min — ABNORMAL LOW (ref >60)
Glucose, Bld: 112 mg/dL — ABNORMAL HIGH (ref 70–99)
Potassium: 3.4 mmol/L — ABNORMAL LOW (ref 3.5–5.1)
Sodium: 134 mmol/L — ABNORMAL LOW (ref 135–145)
Total Bilirubin: 3.6 mg/dL — ABNORMAL HIGH (ref 0.3–1.2)
Total Protein: 5.6 g/dL — ABNORMAL LOW (ref 6.5–8.1)

## 2021-04-07 LAB — CBC WITH DIFFERENTIAL/PLATELET
Abs Immature Granulocytes: 0 10*3/uL (ref 0.00–0.07)
Basophils Absolute: 0 10*3/uL (ref 0.0–0.1)
Basophils Relative: 0 %
Eosinophils Absolute: 0.1 10*3/uL (ref 0.0–0.5)
Eosinophils Relative: 2 %
HCT: 23.5 % — ABNORMAL LOW (ref 39.0–52.0)
Hemoglobin: 8.2 g/dL — ABNORMAL LOW (ref 13.0–17.0)
Lymphocytes Relative: 17 %
Lymphs Abs: 0.8 10*3/uL (ref 0.7–4.0)
MCH: 34.3 pg — ABNORMAL HIGH (ref 26.0–34.0)
MCHC: 34.9 g/dL (ref 30.0–36.0)
MCV: 98.3 fL (ref 80.0–100.0)
Monocytes Absolute: 0.2 10*3/uL (ref 0.1–1.0)
Monocytes Relative: 4 %
Neutro Abs: 3.7 10*3/uL (ref 1.7–7.7)
Neutrophils Relative %: 77 %
Platelets: 57 10*3/uL — ABNORMAL LOW (ref 150–400)
RBC: 2.39 MIL/uL — ABNORMAL LOW (ref 4.22–5.81)
RDW: 18.6 % — ABNORMAL HIGH (ref 11.5–15.5)
WBC: 4.8 10*3/uL (ref 4.0–10.5)
nRBC: 0 % (ref 0.0–0.2)
nRBC: 2 /100 WBC — ABNORMAL HIGH

## 2021-04-07 LAB — C-REACTIVE PROTEIN: CRP: 2.1 mg/dL — ABNORMAL HIGH (ref <1.0)

## 2021-04-07 LAB — MAGNESIUM: Magnesium: 1.7 mg/dL (ref 1.7–2.4)

## 2021-04-07 LAB — D-DIMER, QUANTITATIVE: D-Dimer, Quant: 3.48 ug/mL-FEU — ABNORMAL HIGH (ref 0.00–0.50)

## 2021-04-07 MED ORDER — POTASSIUM CHLORIDE CRYS ER 20 MEQ PO TBCR
40.0000 meq | EXTENDED_RELEASE_TABLET | Freq: Once | ORAL | Status: AC
  Administered 2021-04-07: 13:00:00 40 meq via ORAL
  Filled 2021-04-07: qty 2

## 2021-04-07 MED ORDER — MUPIROCIN 2 % EX OINT
1.0000 "application " | TOPICAL_OINTMENT | Freq: Two times a day (BID) | CUTANEOUS | Status: DC
  Administered 2021-04-07 – 2021-04-10 (×7): 1 via NASAL
  Filled 2021-04-07 (×2): qty 22

## 2021-04-07 MED ORDER — CHLORHEXIDINE GLUCONATE CLOTH 2 % EX PADS
6.0000 | MEDICATED_PAD | Freq: Every day | CUTANEOUS | Status: DC
  Administered 2021-04-07 – 2021-04-09 (×3): 6 via TOPICAL

## 2021-04-07 MED ORDER — VANCOMYCIN HCL 1750 MG/350ML IV SOLN
1750.0000 mg | Freq: Once | INTRAVENOUS | Status: AC
  Administered 2021-04-07: 22:00:00 1750 mg via INTRAVENOUS
  Filled 2021-04-07: qty 350

## 2021-04-07 NOTE — Progress Notes (Signed)
PROGRESS NOTE        PATIENT DETAILS Name: Kyle Pitts Age: 60 y.o. Sex: male Date of Birth: 01-13-1961 Admit Date: 04/04/2021 Admitting Physician Orene Desanctis, DO EO:[id number], Provider, MD  Brief Narrative: Patient is a 60 y.o. male with history of EtOH cirrhosis with esophageal varices/portal hypertensive gastropathy-recent hospitalization (11/26-12/3) for upper GI bleeding with acute blood loss anemia and hepatorenal syndrome-presented to the ED on 12/16 with worsening abdominal distention and shortness of breath.  His last paracentesis was on 12/10 as an outpatient (6 L of acetic fluid was removed).  Patient was found to have very tense ascites-AKI-COVID-19 infection-and subsequently admitted to the hospitalist service.  See below for further details.   Subjective:  Patient in bed, appears comfortable, denies any headache, no fever, no chest pain or pressure, no shortness of breath , no abdominal pain. No new focal weakness.   Objective: Vitals: Blood pressure 115/68, pulse 60, temperature 97.8 F (36.6 C), temperature source Oral, resp. rate 16, height 6' (1.829 m), weight 95.8 kg, SpO2 95 %.   Exam:  Awake Alert, No new F.N deficits, Normal affect [place].AT,PERRAL Supple Neck, No JVD,   Symmetrical Chest wall movement, Good air movement bilaterally, CTAB RRR,No Gallops, Rubs or new Murmurs,  +ve B.Sounds, Abd Soft, No tenderness,   1+ edema, R leg cellulitis much improved    Pertinent Labs/Radiology: Recent Labs  Lab 04/04/21 1558 04/05/21 0153 04/06/21 0205 04/07/21 0208  WBC 3.8* 5.0 3.2* 4.8  HGB 9.6* 8.3* 8.5* 8.2*  PLT 97* 85* 58* 57*  NA 135 133* 134* 134*  K 3.6 3.8 3.8 3.4*  CREATININE 2.46* 2.45* 1.98* 1.54*  AST 90* 83* 71* 51*  ALT 25 18 15 14   ALKPHOS 170* 156* 128* 101  BILITOT 6.4* 5.4* 4.2* 3.6*    Assessment/Plan:  Decompensated alcoholic liver cirrhosis with tense ascites: Ultrasound-guided paracentesis on  04/05/2021 with 8 L of fluid removed-continue on albumin-on empiric Rocephin for SBP prophylaxis.  Unable to use diuretics due to hepatorenal syndrome//AKI.  AKI: Concern for hepatorenal syndrome-not on diuretics-no history of vomiting/diarrhea/GI bleeding-do not see any offending medications.  Starting IV albumin/midodrine-have consulted nephrology.  Recent UA on 11/26 with no proteinuria.  Recent CT scan on 11/26 without hydronephrosis/obstructive uropathy.  Avoid nephrotoxic agents-and follow renal function closely-we will await further recommendations from nephrology.  History of recent UGI bleeding due to gastric ulcers and acute blood loss anemia: Recent EGD on 11/30 showed multiple oozing gastric ulcers with an adherent clot.  Patient denies any history of hematochezia/melena/hematemesis.  Hemoglobin seems to be relatively stable-resume PPI twice daily dosing (8 weeks recommended per GI)-GI was planning to repeat second endoscopy in 8 weeks as outpatient.  Watch closely-follow CBC-if any signs of GI bleeding develops-we will need to reconsult GI.  Hepatic encephalopathy: Resumed lactulose and rifaximin-resolved.  Alcoholic liver cirrhosis with portal hypertensive gastropathy/esophageal varices: Not on any beta blocking agents-given AKI-soft BP-we will hold off on starting for now.  Normocytic anemia: Multifactorial-due to recent blood loss-underlying chronic liver disease.  Watch closely.  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  Cytopenia:  Right pretibial wound with infection: Continue vancomycin/Rocephin-appreciate wound care evaluation.  COVID-19 infection: Unvaccinated-apart from cough-no other symptoms-likely a incidental finding-we will plan on Remdesivir x3 days.  CXR without pneumonia.  Hypokalemia - replaced.  Obesity Estimated  body mass index is 28.64 kg/m as calculated from the following:   Height as of this encounter: 6' (1.829  m).   Weight as of this encounter: 95.8 kg.    Procedures: None Consults: Renal DVT Prophylaxis: SCD's Code Status:Full code  Family Communication: None at bedside  Time spent: 35 minutes-Greater than 50% of this time was spent in counseling, explanation of diagnosis, planning of further management, and coordination of care.   Disposition Plan: Status is: Inpatient  Remains inpatient appropriate because: Severe ascites-for paracentesis-AKI with likely hepatorenal syndrome-not yet stable for discharge.  Diet: Diet Order             Diet Heart Room service appropriate? Yes; Fluid consistency: Thin  Diet effective now                     Antimicrobial agents: Anti-infectives (From admission, onward)    Start     Dose/Rate Route Frequency Ordered Stop   04/07/21 2100  vancomycin (VANCOREADY) IVPB 1750 mg/350 mL        1,750 mg 175 mL/hr over 120 Minutes Intravenous  Once 04/07/21 1010     04/06/21 2000  vancomycin (VANCOREADY) IVPB 1500 mg/300 mL        1,500 mg 150 mL/hr over 120 Minutes Intravenous  Once 04/06/21 1257 04/06/21 2313   04/05/21 2000  vancomycin (VANCOREADY) IVPB 1250 mg/250 mL        1,250 mg 166.7 mL/hr over 90 Minutes Intravenous  Once 04/05/21 1455 04/05/21 2321   04/05/21 1015  rifaximin (XIFAXAN) tablet 550 mg        550 mg Oral 2 times daily 04/05/21 0916     04/05/21 1000  cefTRIAXone (ROCEPHIN) 2 g in sodium chloride 0.9 % 100 mL IVPB        2 g 200 mL/hr over 30 Minutes Intravenous Every 24 hours 04/04/21 1935 04/11/21 0959   04/05/21 1000  remdesivir 100 mg in sodium chloride 0.9 % 100 mL IVPB  Status:  Discontinued       See Hyperspace for full Linked Orders Report.   100 mg 200 mL/hr over 30 Minutes Intravenous Daily 04/04/21 1945 04/05/21 0919   04/05/21 1000  remdesivir 100 mg in sodium chloride 0.9 % 100 mL IVPB       See Hyperspace for full Linked Orders Report.   100 mg 200 mL/hr over 30 Minutes Intravenous Daily 04/05/21 0919  04/06/21 1022   04/04/21 2020  vancomycin variable dose per unstable renal function (pharmacist dosing)         Does not apply See admin instructions 04/04/21 2020     04/04/21 1945  vancomycin (VANCOREADY) IVPB 2000 mg/400 mL        2,000 mg 200 mL/hr over 120 Minutes Intravenous  Once 04/04/21 1935 04/05/21 0003   04/04/21 1945  remdesivir 200 mg in sodium chloride 0.9% 250 mL IVPB       See Hyperspace for full Linked Orders Report.   200 mg 580 mL/hr over 30 Minutes Intravenous Once 04/04/21 1945 04/05/21 0003   04/04/21 1815  cefTRIAXone (ROCEPHIN) 2 g in sodium chloride 0.9 % 100 mL IVPB        2 g 200 mL/hr over 30 Minutes Intravenous  Once 04/04/21 1804 04/04/21 2032        MEDICATIONS: Scheduled Meds:  lactulose  20 g Oral BID   midodrine  10 mg Oral TID WC   octreotide  200 mcg Subcutaneous Q12H  pantoprazole  40 mg Oral BID   potassium chloride  40 mEq Oral Once   rifaximin  550 mg Oral BID   vancomycin variable dose per unstable renal function (pharmacist dosing)   Does not apply See admin instructions   Continuous Infusions:  albumin human 25 g (04/07/21 0801)   cefTRIAXone (ROCEPHIN)  IV 2 g (04/07/21 1042)   vancomycin     PRN Meds:.   I have personally reviewed following labs and imaging studies  LABORATORY DATA:  Recent Labs  Lab 04/04/21 1558 04/05/21 0153 04/06/21 0205 04/07/21 0208  WBC 3.8* 5.0 3.2* 4.8  HGB 9.6* 8.3* 8.5* 8.2*  HCT 28.2* 24.6* 26.3* 23.5*  PLT 97* 85* 58* 57*  MCV 99.6 99.2 103.5* 98.3  MCH 33.9 33.5 33.5 34.3*  MCHC 34.0 33.7 32.3 34.9  RDW 19.1* 18.7* 19.2* 18.6*  LYMPHSABS 1.1  --   --  0.8  MONOABS 0.4  --   --  0.2  EOSABS 0.0  --   --  0.1  BASOSABS 0.0  --   --  0.0    Recent Labs  Lab 04/04/21 1558 04/05/21 0153 04/06/21 0205 04/06/21 0823 04/07/21 0208  NA 135 133* 134*  --  134*  K 3.6 3.8 3.8  --  3.4*  CL 107 107 109  --  106  CO2 19* 18* 18*  --  21*  GLUCOSE 113* 107* 140*  --  112*  BUN 29*  28* 21*  --  16  CREATININE 2.46* 2.45* 1.98*  --  1.54*  CALCIUM 8.3* 7.9* 7.8*  --  7.9*  AST 90* 83* 71*  --  51*  ALT 25 18 15   --  14  ALKPHOS 170* 156* 128*  --  101  BILITOT 6.4* 5.4* 4.2*  --  3.6*  ALBUMIN 2.5* 2.2* 2.6*  --  2.9*  MG  --   --   --   --  1.7  CRP  --   --   --  2.6* 2.1*  DDIMER  --   --   --  4.11* 3.48*  INR 1.5*  --   --   --   --   AMMONIA 59*  --   --   --   --   BNP 97.6  --   --   --  632.7*          RADIOLOGY STUDIES/RESULTS: US RENAL  Result Date: 04/05/2021 CLINICAL DATA:  Acute renal failure EXAM: RENAL / URINARY TRACT ULTRASOUND COMPLETE COMPARISON:  CT abdomen and pelvis 03/15/2021. FINDINGS: Right Kidney: Renal measurements: 13.8 x 5.7 x 5.7 cm = volume: 235 mL. Echogenicity within normal limits. No mass or hydronephrosis visualized. Left Kidney: Renal measurements: 12.4 x 6.2 x 5.0 cm = volume: 202 mL. Echogenicity within normal limits. No mass or hydronephrosis visualized. Bladder: Appears normal for degree of bladder distention. Other: None. IMPRESSION: Normal renal ultrasound. Electronically Signed   By: Ronney Asters M.D.   On: 04/05/2021 15:34   DG Chest Port 1 View  Result Date: 04/06/2021 CLINICAL DATA:  Shortness of breath. EXAM: PORTABLE CHEST 1 VIEW COMPARISON:  Chest radiograph 04/04/2021 FINDINGS: The heart size and mediastinal contours are within normal limits. Improved aeration of the left lung base compared to 04/04/2021. No new focal consolidation, pleural effusion, or pneumothorax. Multilevel degenerative changes in the mid to distal thoracic spine. IMPRESSION: Improved aeration at the left lung base compared to prior exam. No new focal consolidation  or pleural effusion. Electronically Signed   By: Sherron Ales M.D.   On: 04/06/2021 09:02     LOS: 3 days   Signature  Susa Raring M.D on 04/07/2021 at 11:51 AM   -  To page go to [email or web address]

## 2021-04-07 NOTE — Evaluation (Signed)
Physical Therapy Evaluation and Discharge Patient Details Name: Kyle Pitts MRN: [id number] DOB: Nov 26, 1960 Today's Date: 04/07/2021  History of Present Illness  The patient is a 60 y.o. year-old w/ hx of cirrhosis, etoh abuse, hx gastric / duodenal ulcers, hx esoph varices, hx septic R knee presented to ED w/ c/o SOB and distended abdomen. Pt found to be COVID+ and noted to have RLE cellulitis. 12/17 paracentesis removing 8.6 liters.  Clinical Impression   Patient evaluated by Physical Therapy with no further acute PT needs identified. Patient ambulated independently in room x 100 ft with no device.  PT is signing off. Thank you for this referral.        Recommendations for follow up therapy are one component of a multi-disciplinary discharge planning process, led by the attending physician.  Recommendations may be updated based on patient status, additional functional criteria and insurance authorization.  Follow Up Recommendations No PT follow up    Assistance Recommended at Discharge None  Functional Status Assessment Patient has not had a recent decline in their functional status  Equipment Recommendations  None recommended by PT    Recommendations for Other Services       Precautions / Restrictions Precautions Precautions: Fall Precaution Comments: air/con precautions Restrictions Weight Bearing Restrictions: No      Mobility  Bed Mobility Overal bed mobility: Independent                  Transfers Overall transfer level: Independent Equipment used: None Transfers: Sit to/from Stand Sit to Stand: Independent                Ambulation/Gait Ambulation/Gait assistance: Independent Gait Distance (Feet): 100 Feet Assistive device: None Gait Pattern/deviations: Antalgic;Wide base of support       General Gait Details: no imbalance; reports RLE is slightly tender and therefore is walking "gingerly"  Stairs            Wheelchair Mobility     Modified Rankin (Stroke Patients Only)       Balance Overall balance assessment: Needs assistance Sitting-balance support: No upper extremity supported;Feet supported Sitting balance-Leahy Scale: Good     Standing balance support: During functional activity;No upper extremity supported Standing balance-Leahy Scale: Good                               Pertinent Vitals/Pain Pain Assessment: No/denies pain    Home Living Family/patient expects to be discharged to:: Private residence Living Arrangements: Alone Available Help at Discharge: Family;Available PRN/intermittently Type of Home: House Home Access: Stairs to enter   Entrance Stairs-Number of Steps: 5   Home Layout: One level Home Equipment: Cane - single Producer, television/film/video (2 wheels) Additional Comments: sister is living with the pt, potentially moving in. Pt reports sister is not in good health, but she does not need any assistance    Prior Function Prior Level of Function : Independent/Modified Independent;Driving             Mobility Comments: independent without AD; no falls ADLs Comments: independent with all ADL     Hand Dominance   Dominant Hand: Right    Extremity/Trunk Assessment   Upper Extremity Assessment Upper Extremity Assessment: Overall WFL for tasks assessed    Lower Extremity Assessment Lower Extremity Assessment: Overall WFL for tasks assessed    Cervical / Trunk Assessment Cervical / Trunk Assessment: Normal  Communication   Communication: No difficulties  Cognition Arousal/Alertness: Awake/alert Behavior During Therapy: WFL for tasks assessed/performed Overall Cognitive Status: Within Functional Limits for tasks assessed                                          General Comments      Exercises     Assessment/Plan    PT Assessment Patient does not need any further PT services  PT Problem List         PT  Treatment Interventions      PT Goals (Current goals can be found in the Care Plan section)  Acute Rehab PT Goals Patient Stated Goal: return home when ready PT Goal Formulation: All assessment and education complete, DC therapy    Frequency     Barriers to discharge        Co-evaluation               AM-PAC PT "6 Clicks" Mobility  Outcome Measure Help needed turning from your back to your side while in a flat bed without using bedrails?: None Help needed moving from lying on your back to sitting on the side of a flat bed without using bedrails?: None Help needed moving to and from a bed to a chair (including a wheelchair)?: None Help needed standing up from a chair using your arms (e.g., wheelchair or bedside chair)?: None Help needed to walk in hospital room?: None Help needed climbing 3-5 steps with a railing? : None 6 Click Score: 24    End of Session   Activity Tolerance: Patient tolerated treatment well Patient left: in bed;with call bell/phone within reach Nurse Communication: Other (comment) (IV needs attention) PT Visit Diagnosis: Difficulty in walking, not elsewhere classified (R26.2)    Time: 9030-0923 PT Time Calculation (min) (ACUTE ONLY): 27 min   Charges:   PT Evaluation $PT Eval Low Complexity: 1 Low PT Treatments $Gait Training: 8-22 mins         Jerolyn Center, PT Acute Rehabilitation Services  Pager [phone] Office [phone]   Zena Amos 04/07/2021, 10:32 AM

## 2021-04-07 NOTE — Progress Notes (Signed)
Pharmacy Antibiotic Note  Kyle Pitts is a 60 y.o. male admitted on 04/04/2021 with cellulitis.  Pharmacy has been consulted for vancomycin dosing.  Patient with a history of alcoholic cirrhosis, esophageal varices, gastric/duodenal ulcer, septic right knee . Patient presenting with SOB and abdominal ascites. Patient with noted right LE pre-tibial wound cellulitis. Patient is also COVID positive - remdesivir course completed on 04/06/21. Afebrile with normal WBC. SCr down to 1.54 today, baseline is variable.  Plan: Vancomycin 1750 mg IV x1       eAUC 493.1, cmin 11.5 with 1750 mg q24h dosing Re-calculate dose in 24 hours Trend WBC, Fever, Renal function, & Clinical course F/u cultures, clinical course, WBC, fever curve De-escalate when able  Height: 6' (182.9 cm) Weight: 95.8 kg (211 lb 3.2 oz) IBW/kg (Calculated) : 77.6  Temp (24hrs), Avg:98.2 F (36.8 C), Min:97.8 F (36.6 C), Max:98.4 F (36.9 C)  Recent Labs  Lab 04/04/21 1558 04/05/21 0153 04/06/21 0205 04/07/21 0208  WBC 3.8* 5.0 3.2* 4.8  CREATININE 2.46* 2.45* 1.98* 1.54*     Estimated Creatinine Clearance: 62 mL/min (A) (by C-G formula based on SCr of 1.54 mg/dL (H)).    Allergies  Allergen Reactions   Latex Itching   Tape Itching   Amoxicillin Other (See Comments)    Itching around eyes and scalp on amoxicilin   Cephalexin Nausea Only    Upset stomach Ceftriaxone is ok   Penicillins Rash   Antimicrobials this admission: CTX 12/16>>12/22 Vancomycin 12/16 >>  Remdesivir 12/16>>12/18  Microbiology results: 12/16 BCx: NGTD 12/18 MRSA PCR: positive  Thank you for allowing pharmacy to be a part of this patients care.  Lissa Merlin, PharmD PGY1 Acute Care Pharmacy Resident  Phone: [phone] 04/07/2021  10:09 AM  Please check [email or web address] for unit-specific pharmacy phone numbers.

## 2021-04-07 NOTE — Progress Notes (Addendum)
Occupational Therapy Treatment Patient Details Name: Kyle Pitts MRN: [id number] DOB: 1960-05-04 Today's Date: 04/07/2021   History of present illness The patient is a 60 y.o. year-old w/ hx of cirrhosis, etoh abuse, hx gastric / duodenal ulcers, hx esoph varices, hx septic R knee presented to ED w/ c/o SOB and distended abdomen. Pt found to be COVID+ and noted to have RLE cellulitis. 12/17 paracentesis removing 8.6 liters.   OT comments  Pt received in bed, agreeable to OT session. Pt demonstrates ability to complete LB dressing with minA for donning R sock, pt reports he doesn't wear socks at home. Pt is otherwise independent with ADL and functional mobility without AD. Pt ambulated in room, completed toileting and grooming while maintaining SpO2 >94% RA. Pt has met all OT goals, all education complete and pt with no additional questions. Pt discharged from OT.    Recommendations for follow up therapy are one component of a multi-disciplinary discharge planning process, led by the attending physician.  Recommendations may be updated based on patient status, additional functional criteria and insurance authorization.    Follow Up Recommendations  No OT follow up    Assistance Recommended at Discharge Intermittent Supervision/Assistance  Equipment Recommendations  None recommended by OT    Recommendations for Other Services      Precautions / Restrictions Precautions Precautions: Fall Precaution Comments: air/con precautions Restrictions Weight Bearing Restrictions: No       Mobility Bed Mobility Overal bed mobility: Independent                  Transfers Overall transfer level: Independent Equipment used: None Transfers: Sit to/from Stand Sit to Stand: Independent                 Balance Overall balance assessment: Independent                                         ADL either performed or assessed with clinical judgement   ADL  Overall ADL's : At baseline                                       General ADL Comments: pt demonstrated ability to complete toileting, grooming and ADL at independent level. He continued to rquire assistance to don R due to ROM limitations following previous knee sx. Continued to educate pt on use of AE to assit with dressing and where to purchase. Pt otherwise independent with all ADL.    Extremity/Trunk Assessment Upper Extremity Assessment Upper Extremity Assessment: Overall WFL for tasks assessed   Lower Extremity Assessment Lower Extremity Assessment: Overall WFL for tasks assessed        Vision       Perception     Praxis      Cognition Arousal/Alertness: Awake/alert Behavior During Therapy: WFL for tasks assessed/performed Overall Cognitive Status: Within Functional Limits for tasks assessed                                            Exercises     Shoulder Instructions       General Comments SpO2 >94% on AR with exertion.    Pertinent Vitals/ Pain  Pain Assessment: No/denies pain  Home Living                                          Prior Functioning/Environment              Frequency  Min 2X/week        Progress Toward Goals  OT Goals(current goals can now be found in the care plan section)  Progress towards OT goals: Goals met/education completed, patient discharged from OT  Acute Rehab OT Goals Patient Stated Goal: to go home OT Goal Formulation: With patient Time For Goal Achievement: 04/20/21 Potential to Achieve Goals: Good ADL Goals Pt Will Perform Lower Body Dressing: with modified independence;sit to/from stand Pt Will Transfer to Toilet: with modified independence;ambulating Additional ADL Goal #1: Pt will demonstrated independence with 3 energy conservation strategies during ADL completion.  Plan Discharge plan remains appropriate    Co-evaluation                  AM-PAC OT "6 Clicks" Daily Activity     Outcome Measure   Help from another person eating meals?: None Help from another person taking care of personal grooming?: None Help from another person toileting, which includes using toliet, bedpan, or urinal?: None Help from another person bathing (including washing, rinsing, drying)?: None Help from another person to put on and taking off regular upper body clothing?: None Help from another person to put on and taking off regular lower body clothing?: A Little 6 Click Score: 23    End of Session    OT Visit Diagnosis: Other abnormalities of gait and mobility (R26.89)   Activity Tolerance Patient tolerated treatment well   Patient Left in bed;with call bell/phone within reach   Nurse Communication Mobility status        Time: 0164-2903 OT Time Calculation (min): 14 min  Charges: OT General Charges $OT Visit: 1 Visit OT Treatments $Self Care/Home Management : 8-22 mins  Helene Kelp OTR/L Acute Rehabilitation Services Office: [place] 04/07/2021, 2:30 PM

## 2021-04-07 NOTE — Progress Notes (Signed)
°  Transition of Care [facility]) Screening Note   Patient Details  Name: Kyle Pitts Date of Birth: [birth year]   Transition of Care [facility]) CM/SW Contact:    Mearl Latin, LCSW Phone Number: 04/07/2021, 8:32 AM    Transition of Care Department [facility]) has reviewed patient and no TOC needs have been identified at this time. We will continue to monitor patient advancement through interdisciplinary progression rounds. If new patient transition needs arise, please place a TOC consult.

## 2021-04-07 NOTE — Progress Notes (Signed)
[facility] NEPHROLOGY PROGRESS NOTE  Assessment/ Plan:  Kyle Pitts is a 60 y.o. y.o. 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  #Oliguric acute renal failure likely type 1 hepatorenal syndrome - Decreased urine output over last 24h, 0.3 cc/kg/hr - Renal function continues to improve from arrival, sCr 1.5<1.98. Baseline appears to be ~1.2. - No evidence of obstructive uropathy or hydronephrosis on recent CT and ultrasound - Mild hypokalemia at 3.4 today, otherwise no significant electrolyte abnormalities - With improvement of renal function bicarbonate also improving - Blood pressures appropriate, MAP ~80 - Continue with IV albumin, midodrine, and octreotide for hepatorenal syndrome - Daily renal function panel - Strict ins and outs - Avoid nephrotoxins  #Non-anion gap metabolic acidosis - Bicarb improving with improvement of renal function - Would continue with hepatorenal syndrome protocol as above, hopeful will resolve with continued regimen - Daily renal function panel  #Hypervolemic hyponatremia - Na 134, just mildly hyponatremic. Most likely in setting of acute decompensated cirrhosis - Medications as above  #Acute decompensated cirrhosis with recurrent ascites #Esophageal varices, portal hypertensive gastropathy - Getting daily lactulose and rifaximin in addition to medications above - Paracentesis 12/17 w/ 8.5L removed - Management per primary  #COVID-19 infection - On room air - Management per primary  #Chronic normocytic anemia #Thrombocytopenia - Most likely due to chronic liver failure - No signs of bleeding on exam - Management per  primary  Subjective:    This morning, patient reports he is feeling well. Mentions improvement of abdominal pain after paracentesis. Eating and drinking well.  Objective Vital signs in last 24 hours: Vitals:   04/07/21 0109 04/07/21 0500 04/07/21 0501 04/07/21 0805  BP: 111/69  109/65 115/68  Pulse: (!) 59  (!) 55 60  Resp: 20  19 16   Temp: 98.4 F (36.9 C)  98.4 F (36.9 C) 97.8 F (36.6 C)  TempSrc: Oral  Oral Oral  SpO2: 96%  96% 95%  Weight:  95.8 kg    Height:       Weight change:   Intake/Output Summary (Last 24 hours) at 04/07/2021 1111 Last data filed at 04/07/2021 0505 Gross per 24 hour  Intake --  Output 325 ml  Net -325 ml    Labs: Basic Metabolic Panel: Recent Labs  Lab 04/05/21 0153 04/06/21 0205 04/07/21 0208  NA 133* 134* 134*  K 3.8 3.8 3.4*  CL 107 109 106  CO2 18* 18* 21*  GLUCOSE 107* 140* 112*  BUN 28* 21* 16  CREATININE 2.45* 1.98* 1.54*  CALCIUM 7.9* 7.8* 7.9*   Liver Function Tests: Recent Labs  Lab 04/05/21 0153 04/06/21 0205 04/07/21 0208  AST 83* 71* 51*  ALT 18 15 14   ALKPHOS 156* 128* 101  BILITOT 5.4* 4.2* 3.6*  PROT 6.2* 6.0* 5.6*  ALBUMIN 2.2* 2.6* 2.9*   Recent Labs  Lab 04/04/21 1558  LIPASE 67*   Recent Labs  Lab 04/04/21 1558  AMMONIA 59*   CBC: Recent Labs  Lab 04/04/21 1558 04/05/21 0153 04/06/21 0205 04/07/21 0208  WBC 3.8* 5.0 3.2* 4.8  NEUTROABS 2.3  --   --  3.7  HGB 9.6* 8.3* 8.5* 8.2*  HCT 28.2* 24.6* 26.3* 23.5*  MCV 99.6 99.2 103.5* 98.3  PLT 97* 85*  58* 57*   Cardiac Enzymes: No results for input(s): CKTOTAL, CKMB, CKMBINDEX, TROPONINI in the last 168 hours. CBG: No results for input(s): GLUCAP in the last 168 hours.  Iron Studies: No results for input(s): IRON, TIBC, TRANSFERRIN, FERRITIN in the last 72 hours. Studies/Results: US RENAL  Result Date: 04/05/2021 CLINICAL DATA:  Acute renal failure EXAM: RENAL / URINARY TRACT ULTRASOUND COMPLETE COMPARISON:  CT abdomen and pelvis  03/15/2021. FINDINGS: Right Kidney: Renal measurements: 13.8 x 5.7 x 5.7 cm = volume: 235 mL. Echogenicity within normal limits. No mass or hydronephrosis visualized. Left Kidney: Renal measurements: 12.4 x 6.2 x 5.0 cm = volume: 202 mL. Echogenicity within normal limits. No mass or hydronephrosis visualized. Bladder: Appears normal for degree of bladder distention. Other: None. IMPRESSION: Normal renal ultrasound. Electronically Signed   By: Darliss Cheney M.D.   On: 04/05/2021 15:34   DG Chest Port 1 View  Result Date: 04/06/2021 CLINICAL DATA:  Shortness of breath. EXAM: PORTABLE CHEST 1 VIEW COMPARISON:  Chest radiograph 04/04/2021 FINDINGS: The heart size and mediastinal contours are within normal limits. Improved aeration of the left lung base compared to 04/04/2021. No new focal consolidation, pleural effusion, or pneumothorax. Multilevel degenerative changes in the mid to distal thoracic spine. IMPRESSION: Improved aeration at the left lung base compared to prior exam. No new focal consolidation or pleural effusion. Electronically Signed   By: Sherron Ales M.D.   On: 04/06/2021 09:02    Medications: Infusions:  albumin human 25 g (04/07/21 0801)   cefTRIAXone (ROCEPHIN)  IV 2 g (04/07/21 1042)   vancomycin      Scheduled Medications:  lactulose  20 g Oral BID   midodrine  10 mg Oral TID WC   octreotide  200 mcg Subcutaneous Q12H   pantoprazole  40 mg Oral BID   rifaximin  550 mg Oral BID   vancomycin variable dose per unstable renal function (pharmacist dosing)   Does not apply See admin instructions    have reviewed scheduled and prn medications.  Physical Exam: General: Resting comfortably, no acute distress Heart: Regular rate, rhythm. Normal S1, S2. No murmurs. Lungs: Normal respiratory effort. Clear to auscultation bilaterally. Abdomen: Soft, distended, non-tender. Extremities: 1+ pitting edema bilateral lower extremities. Neuro: Awake, alert, conversing appropriately. No  focal deficits.  Evlyn Kanner, MD Internal Medicine Resident PGY-2 Pager: [phone] 04/07/2021,11:11 AM  LOS: 3 days

## 2021-04-08 LAB — CBC WITH DIFFERENTIAL/PLATELET
Abs Immature Granulocytes: 0.01 10*3/uL (ref 0.00–0.07)
Basophils Absolute: 0 10*3/uL (ref 0.0–0.1)
Basophils Relative: 0 %
Eosinophils Absolute: 0.2 10*3/uL (ref 0.0–0.5)
Eosinophils Relative: 4 %
HCT: 23.7 % — ABNORMAL LOW (ref 39.0–52.0)
Hemoglobin: 8.1 g/dL — ABNORMAL LOW (ref 13.0–17.0)
Immature Granulocytes: 0 %
Lymphocytes Relative: 34 %
Lymphs Abs: 1.7 10*3/uL (ref 0.7–4.0)
MCH: 33.3 pg (ref 26.0–34.0)
MCHC: 34.2 g/dL (ref 30.0–36.0)
MCV: 97.5 fL (ref 80.0–100.0)
Monocytes Absolute: 0.7 10*3/uL (ref 0.1–1.0)
Monocytes Relative: 15 %
Neutro Abs: 2.3 10*3/uL (ref 1.7–7.7)
Neutrophils Relative %: 47 %
Platelets: 51 10*3/uL — ABNORMAL LOW (ref 150–400)
RBC: 2.43 MIL/uL — ABNORMAL LOW (ref 4.22–5.81)
RDW: 18.6 % — ABNORMAL HIGH (ref 11.5–15.5)
WBC: 4.8 10*3/uL (ref 4.0–10.5)
nRBC: 0 % (ref 0.0–0.2)

## 2021-04-08 LAB — COMPREHENSIVE METABOLIC PANEL
ALT: 13 U/L (ref 0–44)
AST: 46 U/L — ABNORMAL HIGH (ref 15–41)
Albumin: 3.5 g/dL (ref 3.5–5.0)
Alkaline Phosphatase: 87 U/L (ref 38–126)
Anion gap: 7 (ref 5–15)
BUN: 12 mg/dL (ref 6–20)
CO2: 20 mmol/L — ABNORMAL LOW (ref 22–32)
Calcium: 8.1 mg/dL — ABNORMAL LOW (ref 8.9–10.3)
Chloride: 108 mmol/L (ref 98–111)
Creatinine, Ser: 1.29 mg/dL — ABNORMAL HIGH (ref 0.61–1.24)
GFR, Estimated: >60 mL/min (ref >60)
Glucose, Bld: 109 mg/dL — ABNORMAL HIGH (ref 70–99)
Potassium: 3.7 mmol/L (ref 3.5–5.1)
Sodium: 135 mmol/L (ref 135–145)
Total Bilirubin: 3.3 mg/dL — ABNORMAL HIGH (ref 0.3–1.2)
Total Protein: 5.9 g/dL — ABNORMAL LOW (ref 6.5–8.1)

## 2021-04-08 LAB — BRAIN NATRIURETIC PEPTIDE: B Natriuretic Peptide: 628.4 pg/mL — ABNORMAL HIGH (ref 0.0–100.0)

## 2021-04-08 LAB — C-REACTIVE PROTEIN: CRP: 1.6 mg/dL — ABNORMAL HIGH (ref <1.0)

## 2021-04-08 LAB — MAGNESIUM: Magnesium: 1.8 mg/dL (ref 1.7–2.4)

## 2021-04-08 MED ORDER — MORPHINE SULFATE (PF) 2 MG/ML IV SOLN
2.0000 mg | INTRAVENOUS | Status: DC | PRN
  Administered 2021-04-08: 06:00:00 2 mg via INTRAVENOUS
  Filled 2021-04-08: qty 1

## 2021-04-08 MED ORDER — MORPHINE SULFATE (PF) 2 MG/ML IV SOLN: 1.0000 mg | INTRAVENOUS | Status: DC | PRN

## 2021-04-08 NOTE — TOC Initial Note (Signed)
Transition of Care [facility]) - Initial/Assessment Note    Patient Details  Name: Kyle Pitts MRN: [id number] Date of Birth: 07-Nov-1960  Transition of Care [facility]) CM/SW Contact:    Lawerance Sabal, RN Phone Number: 04/08/2021, 1:01 PM  Clinical Narrative:            Patient with 5 admissions in past 6 months. Is uninsured. Admitted with ascites, covid. Will have paracentesis again today Has follow up at [facility], on AVS. Could not reach patient by phone.  Please send DC meds through [facility] pharmacy.  CM will follow for MATCH needs        Expected Discharge Plan: Home/Self Care Barriers to Discharge: Continued Medical Work up   Patient Goals and CMS Choice Patient states their goals for this hospitalization and ongoing recovery are:: return home      Expected Discharge Plan and Services Expected Discharge Plan: Home/Self Care                                              Prior Living Arrangements/Services                       Activities of Daily Living Home Assistive Devices/Equipment: None ADL Screening (condition at time of admission) Patient's cognitive ability adequate to safely complete daily activities?: Yes Is the patient deaf or have difficulty hearing?: No Does the patient have difficulty seeing, even when wearing glasses/contacts?: No Does the patient have difficulty concentrating, remembering, or making decisions?: No Patient able to express need for assistance with ADLs?: Yes Does the patient have difficulty dressing or bathing?: No Independently performs ADLs?: Yes (appropriate for developmental age) Does the patient have difficulty walking or climbing stairs?: Yes Weakness of Legs: None Weakness of Arms/Hands: None  Permission Sought/Granted                  Emotional Assessment              Admission diagnosis:  Ascites [R18.8] COVID-19 virus infection [U07.1] Patient Active Problem List   Diagnosis Date Noted    COVID-19 virus infection 04/04/2021   AKI (acute kidney injury) (HCC) 04/04/2021   Cellulitis of right leg 04/04/2021   Pancytopenia (HCC) 04/04/2021   Transaminitis 04/04/2021   Hepatorenal syndrome (HCC)    Metabolic acidosis 03/16/2021   Hepatic encephalopathy 03/15/2021   Severe protein-calorie malnutrition (HCC) 03/15/2021   Thrombocytopenia (HCC) 03/15/2021   Alcoholic cirrhosis of liver with ascites (HCC)    Esophageal varices in alcoholic cirrhosis (HCC)    Gastric ulcer due to nonsteroidal anti-inflammatory drug (NSAID)    Duodenal ulcer    Portal hypertensive gastropathy (HCC)    Acute blood loss anemia    Acute renal failure (ARF) (HCC) 02/13/2021   Elevated LFTs 02/13/2021   Allergic reaction 11/21/2020   Osteomyelitis of right leg (HCC) 11/21/2020   Streptococcus infection, group A 11/21/2020   Myofasciitis 11/21/2020   PICC line infection 11/08/2020   Anemia 11/08/2020   Decompensated hepatic cirrhosis (HCC) 10/07/2020   Hyponatremia 10/07/2020   Leucocytosis 10/07/2020   Cellulitis and abscess of right leg 10/07/2020   Septic arthritis of knee (HCC) 09/28/2020   ALLERGIC RHINITIS 01/13/2007   ASTHMA 01/13/2007   PCP:  Default, Provider, MD Pharmacy:   [id number] - [place], [place] - [address]  [place] [address] Phone: [phone] Fax: [phone]     Social Determinants of Health (SDOH) Interventions    Readmission Risk Interventions Readmission Risk Prevention Plan 03/22/2021  Transportation Screening Complete  PCP or Specialist Appt within 3-5 Days Complete  HRI or Home Care Consult Complete  Social Work Consult for Recovery Care Planning/Counseling Complete  Palliative Care Screening Not Applicable  Medication Review [employer]) Complete  Some recent data might be hidden

## 2021-04-08 NOTE — Progress Notes (Signed)
[facility] NEPHROLOGY PROGRESS NOTE  Assessment/ Plan:  Kyle Pitts is a 60 y.o. y.o. 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  #Oliguric acute renal failure, possibly hepatorenal syndrome although more likely to be intravascularly depleted d/t chronic liver disease - Similar urine output over the last 24h, 0.3 cc/kg/hr - Renal function has improved, sCr 1.29 this morning from 1.54. Baseline sCr ~1.2. - Hypernatremia has resolved. Sill mildly acidotic, likely close to baseline. - Nephrology will sign off today given improvement over last four days and return to baseline renal function. Would continue with intravascular expansion with IV albumin while inpatient. Please contact us back for any further questions or concerns.  #Acute decompensated cirrhosis with recurrent ascites #Esophageal varices, portal hypertensive gastropathy - Getting daily lactulose and rifaximin in addition to medications above - Paracentesis 12/17 with 8.5L removed - Management per primary  #COVID-19 infection - On room air, management per primary  Subjective:    This morning, patient reports he is doing well. Mentions he is looking forward to going home soon. Denies nausea, vomiting, abdominal pain.  Objective Vital signs in last 24 hours: Vitals:   04/08/21 0020 04/08/21 0409 04/08/21 0500 04/08/21 0814  BP: 120/67 119/82  128/87  Pulse: (!) 53 (!) 59  (!) 58  Resp: 18 18  18   Temp: 98.1 F (36.7 C) 98.2 F (36.8 C)  98.2 F (36.8 C)  TempSrc: Oral Oral  Oral  SpO2: 98% 96%  99%  Weight:   92.4 kg   Height:       Weight change: -3.4 kg  Intake/Output Summary (Last 24  hours) at 04/08/2021 1021 Last data filed at 04/08/2021 0900 Gross per 24 hour  Intake 400 ml  Output 575 ml  Net -175 ml    Labs: Basic Metabolic Panel: Recent Labs  Lab 04/06/21 0205 04/07/21 0208 04/08/21 0134  NA 134* 134* 135  K 3.8 3.4* 3.7  CL 109 106 108  CO2 18* 21* 20*  GLUCOSE 140* 112* 109*  BUN 21* 16 12  CREATININE 1.98* 1.54* 1.29*  CALCIUM 7.8* 7.9* 8.1*   Liver Function Tests: Recent Labs  Lab 04/06/21 0205 04/07/21 0208 04/08/21 0134  AST 71* 51* 46*  ALT 15 14 13   ALKPHOS 128* 101 87  BILITOT 4.2* 3.6* 3.3*  PROT 6.0* 5.6* 5.9*  ALBUMIN 2.6* 2.9* 3.5   Recent Labs  Lab 04/04/21 1558  LIPASE 67*   Recent Labs  Lab 04/04/21 1558  AMMONIA 59*   CBC: Recent Labs  Lab 04/04/21 1558 04/05/21 0153 04/06/21 0205 04/07/21 0208 04/08/21 0134  WBC 3.8* 5.0 3.2* 4.8 4.8  NEUTROABS 2.3  --   --  3.7 2.3  HGB 9.6* 8.3* 8.5* 8.2* 8.1*  HCT 28.2* 24.6* 26.3* 23.5* 23.7*  MCV 99.6 99.2 103.5* 98.3 97.5  PLT 97* 85* 58* 57* 51*   Cardiac Enzymes: No results for input(s): CKTOTAL, CKMB, CKMBINDEX, TROPONINI in the last 168 hours. CBG: No results for input(s): GLUCAP in the last 168 hours.  Iron Studies: No results for input(s): IRON, TIBC, TRANSFERRIN, FERRITIN in the last 72 hours. Studies/Results: No results found.  Medications: Infusions:  albumin human 25 g (04/08/21 0806)   cefTRIAXone (ROCEPHIN)  IV 2 g (04/08/21 0913)  Scheduled Medications:  Chlorhexidine Gluconate Cloth  6 each Topical Q0600   lactulose  20 g Oral BID   midodrine  10 mg Oral TID WC   mupirocin ointment  1 application Nasal BID   pantoprazole  40 mg Oral BID   rifaximin  550 mg Oral BID   vancomycin variable dose per unstable renal function (pharmacist dosing)   Does not apply See admin instructions    have reviewed scheduled and prn medications.  Physical Exam: General: Resting comfortably, no acute distress Heart: Regular rate, rhythm. Normal S1,  S2. No murmurs. Lungs: Normal respiratory effort. Clear to auscultation bilaterally. Abdomen: Soft, non-tender, distended Extremities: 2+ pitting edema bilateral lower extremities Neuro: Awake, alert, conversing appropriately. No focal deficits.  Evlyn Kanner, MD Internal Medicine Resident PGY-2 Pager: [phone] 04/08/2021,10:21 AM  LOS: 4 days

## 2021-04-08 NOTE — Progress Notes (Signed)
PROGRESS NOTE        PATIENT DETAILS Name: Kyle Pitts Age: 60 y.o. Sex: male Date of Birth: 03-09-1961 Admit Date: 04/04/2021 Admitting Physician Orene Desanctis, DO SQ:[id number], Provider, MD  Brief Narrative: Patient is a 60 y.o. male with history of EtOH cirrhosis with esophageal varices/portal hypertensive gastropathy-recent hospitalization (11/26-12/3) for upper GI bleeding with acute blood loss anemia and hepatorenal syndrome-presented to the ED on 12/16 with worsening abdominal distention and shortness of breath.  His last paracentesis was on 12/10 as an outpatient (6 L of acetic fluid was removed).  Patient was found to have very tense ascites-AKI-COVID-19 infection-and subsequently admitted to the hospitalist service.  See below for further details.   Subjective:  Patient in bed, appears comfortable, denies any headache, no fever, no chest pain or pressure, no shortness of breath , no abdominal pain. No new focal weakness.    Objective: Vitals: Blood pressure 128/87, pulse (!) 58, temperature 98.2 F (36.8 C), temperature source Oral, resp. rate 18, height 6' (1.829 m), weight 92.4 kg, SpO2 99 %.   Exam:  Awake Alert, No new F.N deficits, Normal affect [place].AT,PERRAL Supple Neck, No JVD,   Symmetrical Chest wall movement, Good air movement bilaterally, CTAB RRR,No Gallops, Rubs or new Murmurs,  +ve B.Sounds, Abd mildly distended but nontender Trace edema, right lower extremity cellulitis almost completely resolved.  Healing chronic right leg wound present on admission.     Pertinent Labs/Radiology: Recent Labs  Lab 04/04/21 1558 04/05/21 0153 04/06/21 0205 04/07/21 0208 04/08/21 0134  WBC 3.8* 5.0 3.2* 4.8 4.8  HGB 9.6* 8.3* 8.5* 8.2* 8.1*  PLT 97* 85* 58* 57* 51*  NA 135 133* 134* 134* 135  K 3.6 3.8 3.8 3.4* 3.7  CREATININE 2.46* 2.45* 1.98* 1.54* 1.29*  AST 90* 83* 71* 51* 46*  ALT 25 18 15 14 13   ALKPHOS 170* 156* 128* 101 87   BILITOT 6.4* 5.4* 4.2* 3.6* 3.3*    Assessment/Plan:  Decompensated alcoholic liver cirrhosis with tense ascites: Ultrasound-guided paracentesis on 04/05/2021 with 8 L of fluid removed-continue on albumin-on empiric Rocephin for SBP prophylaxis for another day.  Unable to use diuretics due to hepatorenal syndrome//AKI.  Repeat ultrasound-guided paracentesis requested for therapeutic purposes on 04/08/2021.  AKI: Concern for hepatorenal syndrome-not on diuretics-no history of vomiting/diarrhea/GI bleeding-do not see any offending medications. Recent UA on 11/26 with no proteinuria.  Recent CT scan on 11/26 without hydronephrosis/obstructive uropathy.  Seen by nephrology and was treated with midodrine, IV octreotide and albumin with good results, renal function much improved.  Continue albumin another day and monitor.  History of recent UGI bleeding due to gastric ulcers and acute blood loss anemia: Recent EGD on 11/30 showed multiple oozing gastric ulcers with an adherent clot.  Patient denies any history of hematochezia/melena/hematemesis.  Hemoglobin seems to be relatively stable-resume PPI twice daily dosing (8 weeks recommended per GI)-GI was planning to repeat second endoscopy in 8 weeks as outpatient.  Watch closely-follow CBC-if any signs of GI bleeding develops-we will need to reconsult GI.  Hepatic encephalopathy: Resumed lactulose and rifaximin-resolved.  Alcoholic liver cirrhosis with portal hypertensive gastropathy/esophageal varices: Not on any beta blocking agents-given AKI-soft BP-we will hold off on starting for now.  Normocytic anemia: Multifactorial-due to recent blood loss-underlying chronic liver disease.  Watch closely.  Thrombocytopenia: Due to hypersplenism in the setting of  liver disease-watch closely.  Cytopenia:  Right pretibial wound with infection: Resolved has finished antibiotic treatment for this problem.  Finished Rocephin treatment today vancomycin has been  stopped.  COVID-19 infection: Unvaccinated-apart from cough-no other symptoms-likely a incidental finding-we will plan on Remdesivir x3 days.  CXR without pneumonia.  Hypokalemia - replaced.  Obesity Estimated body mass index is 27.63 kg/m as calculated from the following:   Height as of this encounter: 6' (1.829 m).   Weight as of this encounter: 92.4 kg.    Procedures: None Consults: Renal DVT Prophylaxis: SCD's Code Status:Full code  Family Communication: None at bedside  Time spent: 35 minutes-Greater than 50% of this time was spent in counseling, explanation of diagnosis, planning of further management, and coordination of care.   Disposition Plan: Status is: Inpatient  Remains inpatient appropriate because: Severe ascites-for paracentesis-AKI with likely hepatorenal syndrome-not yet stable for discharge.  Diet: Diet Order             Diet Heart Room service appropriate? Yes; Fluid consistency: Thin  Diet effective now                     Antimicrobial agents: Anti-infectives (From admission, onward)    Start     Dose/Rate Route Frequency Ordered Stop   04/07/21 2100  vancomycin (VANCOREADY) IVPB 1750 mg/350 mL        1,750 mg 175 mL/hr over 120 Minutes Intravenous  Once 04/07/21 1010 04/08/21 0700   04/06/21 2000  vancomycin (VANCOREADY) IVPB 1500 mg/300 mL        1,500 mg 150 mL/hr over 120 Minutes Intravenous  Once 04/06/21 1257 04/06/21 2313   04/05/21 2000  vancomycin (VANCOREADY) IVPB 1250 mg/250 mL        1,250 mg 166.7 mL/hr over 90 Minutes Intravenous  Once 04/05/21 1455 04/05/21 2321   04/05/21 1015  rifaximin (XIFAXAN) tablet 550 mg        550 mg Oral 2 times daily 04/05/21 0916     04/05/21 1000  cefTRIAXone (ROCEPHIN) 2 g in sodium chloride 0.9 % 100 mL IVPB        2 g 200 mL/hr over 30 Minutes Intravenous Every 24 hours 04/04/21 1935 04/11/21 0959   04/05/21 1000  remdesivir 100 mg in sodium chloride 0.9 % 100 mL IVPB  Status:   Discontinued       See Hyperspace for full Linked Orders Report.   100 mg 200 mL/hr over 30 Minutes Intravenous Daily 04/04/21 1945 04/05/21 0919   04/05/21 1000  remdesivir 100 mg in sodium chloride 0.9 % 100 mL IVPB       See Hyperspace for full Linked Orders Report.   100 mg 200 mL/hr over 30 Minutes Intravenous Daily 04/05/21 0919 04/06/21 1022   04/04/21 2020  vancomycin variable dose per unstable renal function (pharmacist dosing)  Status:  Discontinued         Does not apply See admin instructions 04/04/21 2020 04/08/21 1043   04/04/21 1945  vancomycin (VANCOREADY) IVPB 2000 mg/400 mL        2,000 mg 200 mL/hr over 120 Minutes Intravenous  Once 04/04/21 1935 04/05/21 0003   04/04/21 1945  remdesivir 200 mg in sodium chloride 0.9% 250 mL IVPB       See Hyperspace for full Linked Orders Report.   200 mg 580 mL/hr over 30 Minutes Intravenous Once 04/04/21 1945 04/05/21 0003   04/04/21 1815  cefTRIAXone (ROCEPHIN) 2 g in sodium chloride  0.9 % 100 mL IVPB        2 g 200 mL/hr over 30 Minutes Intravenous  Once 04/04/21 1804 04/04/21 2032        MEDICATIONS: Scheduled Meds:  Chlorhexidine Gluconate Cloth  6 each Topical Q0600   lactulose  20 g Oral BID   midodrine  10 mg Oral TID WC   mupirocin ointment  1 application Nasal BID   pantoprazole  40 mg Oral BID   rifaximin  550 mg Oral BID   Continuous Infusions:  albumin human 25 g (04/08/21 0806)   cefTRIAXone (ROCEPHIN)  IV 2 g (04/08/21 0913)   PRN Meds:.   I have personally reviewed following labs and imaging studies  LABORATORY DATA:  Recent Labs  Lab 04/04/21 1558 04/05/21 0153 04/06/21 0205 04/07/21 0208 04/08/21 0134  WBC 3.8* 5.0 3.2* 4.8 4.8  HGB 9.6* 8.3* 8.5* 8.2* 8.1*  HCT 28.2* 24.6* 26.3* 23.5* 23.7*  PLT 97* 85* 58* 57* 51*  MCV 99.6 99.2 103.5* 98.3 97.5  MCH 33.9 33.5 33.5 34.3* 33.3  MCHC 34.0 33.7 32.3 34.9 34.2  RDW 19.1* 18.7* 19.2* 18.6* 18.6*  LYMPHSABS 1.1  --   --  0.8 1.7  MONOABS  0.4  --   --  0.2 0.7  EOSABS 0.0  --   --  0.1 0.2  BASOSABS 0.0  --   --  0.0 0.0    Recent Labs  Lab 04/04/21 1558 04/05/21 0153 04/06/21 0205 04/06/21 0823 04/07/21 0208 04/08/21 0134  NA 135 133* 134*  --  134* 135  K 3.6 3.8 3.8  --  3.4* 3.7  CL 107 107 109  --  106 108  CO2 19* 18* 18*  --  21* 20*  GLUCOSE 113* 107* 140*  --  112* 109*  BUN 29* 28* 21*  --  16 12  CREATININE 2.46* 2.45* 1.98*  --  1.54* 1.29*  CALCIUM 8.3* 7.9* 7.8*  --  7.9* 8.1*  AST 90* 83* 71*  --  51* 46*  ALT 25 18 15   --  14 13  ALKPHOS 170* 156* 128*  --  101 87  BILITOT 6.4* 5.4* 4.2*  --  3.6* 3.3*  ALBUMIN 2.5* 2.2* 2.6*  --  2.9* 3.5  MG  --   --   --   --  1.7 1.8  CRP  --   --   --  2.6* 2.1* 1.6*  DDIMER  --   --   --  4.11* 3.48*  --   INR 1.5*  --   --   --   --   --   AMMONIA 59*  --   --   --   --   --   BNP 97.6  --   --   --  632.7* 628.4*   \  RADIOLOGY STUDIES/RESULTS: No results found.   LOS: 4 days   Signature  M.D on 04/08/2021 at 11:57 AM   -  To page go to [email or web address]

## 2021-04-09 ENCOUNTER — Inpatient Hospital Stay (HOSPITAL_COMMUNITY): Payer: Medicaid Other

## 2021-04-09 HISTORY — PX: IR PARACENTESIS: IMG2679

## 2021-04-09 LAB — COMPREHENSIVE METABOLIC PANEL WITH GFR
ALT: 13 U/L (ref 0–44)
AST: 40 U/L (ref 15–41)
Albumin: 4 g/dL (ref 3.5–5.0)
Alkaline Phosphatase: 87 U/L (ref 38–126)
Anion gap: 7 (ref 5–15)
BUN: 9 mg/dL (ref 6–20)
CO2: 21 mmol/L — ABNORMAL LOW (ref 22–32)
Calcium: 8.5 mg/dL — ABNORMAL LOW (ref 8.9–10.3)
Chloride: 107 mmol/L (ref 98–111)
Creatinine, Ser: 1.16 mg/dL (ref 0.61–1.24)
GFR, Estimated: >60 mL/min
Glucose, Bld: 109 mg/dL — ABNORMAL HIGH (ref 70–99)
Potassium: 3.4 mmol/L — ABNORMAL LOW (ref 3.5–5.1)
Sodium: 135 mmol/L (ref 135–145)
Total Bilirubin: 3.8 mg/dL — ABNORMAL HIGH (ref 0.3–1.2)
Total Protein: 6.4 g/dL — ABNORMAL LOW (ref 6.5–8.1)

## 2021-04-09 LAB — CULTURE, BLOOD (ROUTINE X 2)
Culture: NO GROWTH
Culture: NO GROWTH
Special Requests: ADEQUATE
Special Requests: ADEQUATE

## 2021-04-09 MED ORDER — LIDOCAINE HCL 1 % IJ SOLN
INTRAMUSCULAR | Status: AC
  Administered 2021-04-09: 14:00:00 10 mL
  Filled 2021-04-09: qty 20

## 2021-04-09 MED ORDER — ALBUMIN HUMAN 25 % IV SOLN: 50.0000 g | Freq: Once | INTRAVENOUS | Status: DC | PRN

## 2021-04-09 MED ORDER — POTASSIUM CHLORIDE CRYS ER 20 MEQ PO TBCR
40.0000 meq | EXTENDED_RELEASE_TABLET | Freq: Once | ORAL | Status: AC
  Administered 2021-04-09: 08:00:00 40 meq via ORAL
  Filled 2021-04-09: qty 2

## 2021-04-09 MED ORDER — DOXYCYCLINE HYCLATE 100 MG PO TABS
100.0000 mg | ORAL_TABLET | Freq: Two times a day (BID) | ORAL | Status: DC
  Administered 2021-04-09 – 2021-04-10 (×3): 100 mg via ORAL
  Filled 2021-04-09 (×3): qty 1

## 2021-04-09 NOTE — Progress Notes (Signed)
PROGRESS NOTE        PATIENT DETAILS Name: Kyle Pitts Age: 60 y.o. Sex: male Date of Birth: 07/19/1960 Admit Date: 04/04/2021 Admitting Physician Anselm Jungling, DO OTL:[id number], Provider, MD  Brief Narrative: Patient is a 60 y.o. male with history of EtOH cirrhosis with esophageal varices/portal hypertensive gastropathy-recent hospitalization (11/26-12/3) for upper GI bleeding with acute blood loss anemia and hepatorenal syndrome-presented to the ED on 12/16 with worsening abdominal distention and shortness of breath.  His last paracentesis was on 12/10 as an outpatient (6 L of acetic fluid was removed).  Patient was found to have very tense ascites-AKI-COVID-19 infection-and subsequently admitted to the hospitalist service.  See below for further details.   Subjective:  Patient in bed, appears comfortable, denies any headache, no fever, no chest pain or pressure, no shortness of breath , no abdominal pain. No new focal weakness.   Objective: Vitals: Blood pressure 117/77, pulse (!) 57, temperature 98.7 F (37.1 C), temperature source Oral, resp. rate 18, height 6' (1.829 m), weight 92.4 kg, SpO2 95 %.   Exam:  Awake Alert, No new F.N deficits, Normal affect Kyle Pitts.AT,PERRAL Supple Neck, No JVD,   Symmetrical Chest wall movement, Good air movement bilaterally, CTAB RRR,No Gallops, Rubs or new Murmurs,  +ve B.Sounds, Abd distended, No tenderness,   Trace edema, right lower extremity cellulitis almost completely resolved.  Healing chronic right leg wound present on admission.     Pertinent Labs/Radiology: Recent Labs  Lab 04/05/21 0153 04/06/21 0205 04/07/21 0208 04/08/21 0134 04/09/21 0149  WBC 5.0 3.2* 4.8 4.8  --   HGB 8.3* 8.5* 8.2* 8.1*  --   PLT 85* 58* 57* 51*  --   NA 133* 134* 134* 135 135  K 3.8 3.8 3.4* 3.7 3.4*  CREATININE 2.45* 1.98* 1.54* 1.29* 1.16  AST 83* 71* 51* 46* 40  ALT 18 15 14 13 13   ALKPHOS 156* 128* 101 87 87  BILITOT  5.4* 4.2* 3.6* 3.3* 3.8*    Assessment/Plan:  Decompensated alcoholic liver cirrhosis with tense ascites: Ultrasound-guided paracentesis on 04/05/2021 with 8 L of fluid removed-continue on albumin-on empiric Rocephin for SBP prophylaxis for another day.  Unable to use diuretics due to hepatorenal syndrome//AKI.  Repeat ultrasound-guided paracentesis requested for therapeutic purposes on 04/08/2021 will likely be done 04/09/2021, albumin IV ordered.  AKI: Concern for hepatorenal syndrome-not on diuretics-no history of vomiting/diarrhea/GI bleeding-do not see any offending medications. Recent UA on 11/26 with no proteinuria.  Recent CT scan on 11/26 without hydronephrosis/obstructive uropathy.  Seen by nephrology and was treated with midodrine, IV octreotide and albumin with good results, renal function much improved.  Continue albumin another day and monitor.  History of recent UGI bleeding due to gastric ulcers and acute blood loss anemia: Recent EGD on 11/30 showed multiple oozing gastric ulcers with an adherent clot.  Patient denies any history of hematochezia/melena/hematemesis.  Hemoglobin seems to be relatively stable-resume PPI twice daily dosing (8 weeks recommended per GI)-GI was planning to repeat second endoscopy in 8 weeks as outpatient.  Watch closely-follow CBC-if any signs of GI bleeding develops-we will need to reconsult GI.  Hepatic encephalopathy: Resumed lactulose and rifaximin-resolved.  Alcoholic liver cirrhosis with portal hypertensive gastropathy/esophageal varices: Not on any beta blocking agents-given AKI-soft BP-we will hold off on starting for now.  Normocytic anemia: Multifactorial-due to recent blood loss-underlying chronic  liver disease.  Watch closely.  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  Cytopenia:  Right pretibial wound with infection: Resolved has finished antibiotic treatment for this problem.  Stopped vancomycin  and Rocephin transition to oral doxycycline.  Clinically much improved.  COVID-19 infection: Unvaccinated-apart from cough-no other symptoms-likely a incidental finding-we will plan on Remdesivir x3 days.  CXR without pneumonia.  Hypokalemia - replaced.  Obesity Estimated body mass index is 27.63 kg/m as calculated from the following:   Height as of this encounter: 6' (1.829 m).   Weight as of this encounter: 92.4 kg.    Procedures: None Consults: Renal DVT Prophylaxis: SCD's Code Status:Full code  Family Communication: None at bedside  Time spent: 35 minutes-Greater than 50% of this time was spent in counseling, explanation of diagnosis, planning of further management, and coordination of care.   Disposition Plan: Status is: Inpatient  Remains inpatient appropriate because: Severe ascites-for paracentesis-AKI with likely hepatorenal syndrome-not yet stable for discharge.  Diet: Diet Order             Diet Heart Room service appropriate? Yes; Fluid consistency: Thin  Diet effective now                     Antimicrobial agents: Anti-infectives (From admission, onward)    Start     Dose/Rate Route Frequency Ordered Stop   04/09/21 1000  doxycycline (VIBRA-TABS) tablet 100 mg        100 mg Oral Every 12 hours 04/09/21 0757     04/07/21 2100  vancomycin (VANCOREADY) IVPB 1750 mg/350 mL        1,750 mg 175 mL/hr over 120 Minutes Intravenous  Once 04/07/21 1010 04/08/21 0700   04/06/21 2000  vancomycin (VANCOREADY) IVPB 1500 mg/300 mL        1,500 mg 150 mL/hr over 120 Minutes Intravenous  Once 04/06/21 1257 04/06/21 2313   04/05/21 2000  vancomycin (VANCOREADY) IVPB 1250 mg/250 mL        1,250 mg 166.7 mL/hr over 90 Minutes Intravenous  Once 04/05/21 1455 04/05/21 2321   04/05/21 1015  rifaximin (XIFAXAN) tablet 550 mg        550 mg Oral 2 times daily 04/05/21 0916     04/05/21 1000  cefTRIAXone (ROCEPHIN) 2 g in sodium chloride 0.9 % 100 mL IVPB  Status:   Discontinued        2 g 200 mL/hr over 30 Minutes Intravenous Every 24 hours 04/04/21 1935 04/09/21 0757   04/05/21 1000  remdesivir 100 mg in sodium chloride 0.9 % 100 mL IVPB  Status:  Discontinued       See Hyperspace for full Linked Orders Report.   100 mg 200 mL/hr over 30 Minutes Intravenous Daily 04/04/21 1945 04/05/21 0919   04/05/21 1000  remdesivir 100 mg in sodium chloride 0.9 % 100 mL IVPB       See Hyperspace for full Linked Orders Report.   100 mg 200 mL/hr over 30 Minutes Intravenous Daily 04/05/21 0919 04/06/21 1022   04/04/21 2020  vancomycin variable dose per unstable renal function (pharmacist dosing)  Status:  Discontinued         Does not apply See admin instructions 04/04/21 2020 04/08/21 1043   04/04/21 1945  vancomycin (VANCOREADY) IVPB 2000 mg/400 mL        2,000 mg 200 mL/hr over 120 Minutes Intravenous  Once 04/04/21 1935 04/05/21 0003   04/04/21 1945  remdesivir  200 mg in sodium chloride 0.9% 250 mL IVPB       See Hyperspace for full Linked Orders Report.   200 mg 580 mL/hr over 30 Minutes Intravenous Once 04/04/21 1945 04/05/21 0003   04/04/21 1815  cefTRIAXone (ROCEPHIN) 2 g in sodium chloride 0.9 % 100 mL IVPB        2 g 200 mL/hr over 30 Minutes Intravenous  Once 04/04/21 1804 04/04/21 2032        MEDICATIONS: Scheduled Meds:  Chlorhexidine Gluconate Cloth  6 each Topical Q0600   doxycycline  100 mg Oral Q12H   lactulose  20 g Oral BID   midodrine  10 mg Oral TID WC   mupirocin ointment  1 application Nasal BID   pantoprazole  40 mg Oral BID   rifaximin  550 mg Oral BID   Continuous Infusions:  albumin human     PRN Meds:.   I have personally reviewed following labs and imaging studies  LABORATORY DATA:  Recent Labs  Lab 04/04/21 1558 04/05/21 0153 04/06/21 0205 04/07/21 0208 04/08/21 0134  WBC 3.8* 5.0 3.2* 4.8 4.8  HGB 9.6* 8.3* 8.5* 8.2* 8.1*  HCT 28.2* 24.6* 26.3* 23.5* 23.7*  PLT 97* 85* 58* 57* 51*  MCV 99.6 99.2 103.5*  98.3 97.5  MCH 33.9 33.5 33.5 34.3* 33.3  MCHC 34.0 33.7 32.3 34.9 34.2  RDW 19.1* 18.7* 19.2* 18.6* 18.6*  LYMPHSABS 1.1  --   --  0.8 1.7  MONOABS 0.4  --   --  0.2 0.7  EOSABS 0.0  --   --  0.1 0.2  BASOSABS 0.0  --   --  0.0 0.0    Recent Labs  Lab 04/04/21 1558 04/05/21 0153 04/06/21 0205 04/06/21 0823 04/07/21 0208 04/08/21 0134 04/09/21 0149  NA 135 133* 134*  --  134* 135 135  K 3.6 3.8 3.8  --  3.4* 3.7 3.4*  CL 107 107 109  --  106 108 107  CO2 19* 18* 18*  --  21* 20* 21*  GLUCOSE 113* 107* 140*  --  112* 109* 109*  BUN 29* 28* 21*  --  16 12 9   CREATININE 2.46* 2.45* 1.98*  --  1.54* 1.29* 1.16  CALCIUM 8.3* 7.9* 7.8*  --  7.9* 8.1* 8.5*  AST 90* 83* 71*  --  51* 46* 40  ALT 25 18 15   --  14 13 13   ALKPHOS 170* 156* 128*  --  101 87 87  BILITOT 6.4* 5.4* 4.2*  --  3.6* 3.3* 3.8*  ALBUMIN 2.5* 2.2* 2.6*  --  2.9* 3.5 4.0  MG  --   --   --   --  1.7 1.8  --   CRP  --   --   --  2.6* 2.1* 1.6*  --   DDIMER  --   --   --  4.11* 3.48*  --   --   INR 1.5*  --   --   --   --   --   --   AMMONIA 59*  --   --   --   --   --   --   BNP 97.6  --   --   --  632.7* 628.4*  --     RADIOLOGY STUDIES/RESULTS: No results found.   LOS: 5 days   Signature  Lala Lund M.D on 04/09/2021 at 11:39 AM   -  To page go to [email or web address]

## 2021-04-09 NOTE — Procedures (Signed)
PROCEDURE SUMMARY:  Successful image-guided paracentesis from the right lower abdomen.  Yielded 5.0 liters of clear yellow fluid.  No immediate complications.  EBL < 1 mL Patient tolerated well.   Specimen was not sent for labs.  Please see imaging section of Epic for full dictation.  Villa Herb PA-C 04/09/2021 1:57 PM

## 2021-04-10 ENCOUNTER — Other Ambulatory Visit (HOSPITAL_COMMUNITY): Payer: Self-pay

## 2021-04-10 LAB — COMPREHENSIVE METABOLIC PANEL
ALT: 12 U/L (ref 0–44)
AST: 40 U/L (ref 15–41)
Albumin: 3.6 g/dL (ref 3.5–5.0)
Alkaline Phosphatase: 85 U/L (ref 38–126)
Anion gap: 5 (ref 5–15)
BUN: 7 mg/dL (ref 6–20)
CO2: 21 mmol/L — ABNORMAL LOW (ref 22–32)
Calcium: 8.5 mg/dL — ABNORMAL LOW (ref 8.9–10.3)
Chloride: 110 mmol/L (ref 98–111)
Creatinine, Ser: 1.09 mg/dL (ref 0.61–1.24)
GFR, Estimated: >60 mL/min (ref >60)
Glucose, Bld: 109 mg/dL — ABNORMAL HIGH (ref 70–99)
Potassium: 3.7 mmol/L (ref 3.5–5.1)
Sodium: 136 mmol/L (ref 135–145)
Total Bilirubin: 3.9 mg/dL — ABNORMAL HIGH (ref 0.3–1.2)
Total Protein: 5.8 g/dL — ABNORMAL LOW (ref 6.5–8.1)

## 2021-04-10 MED ORDER — SPIRONOLACTONE 25 MG PO TABS: 25.0000 mg | ORAL_TABLET | Freq: Every day | ORAL | Status: DC

## 2021-04-10 MED ORDER — SPIRONOLACTONE 25 MG PO TABS
25.0000 mg | ORAL_TABLET | Freq: Every day | ORAL | 0 refills | Status: DC
  Filled 2021-04-10: qty 30, 30d supply, fill #0

## 2021-04-10 MED ORDER — DOXYCYCLINE HYCLATE 100 MG PO TABS
100.0000 mg | ORAL_TABLET | Freq: Two times a day (BID) | ORAL | 0 refills | Status: DC
  Filled 2021-04-10: qty 10, 5d supply, fill #0

## 2021-04-10 MED ORDER — MIDODRINE HCL 10 MG PO TABS
10.0000 mg | ORAL_TABLET | Freq: Three times a day (TID) | ORAL | 0 refills | Status: DC
  Filled 2021-04-10: qty 90, 30d supply, fill #0

## 2021-04-10 MED ORDER — LACTULOSE ENCEPHALOPATHY 10 GM/15ML PO SOLN
20.0000 g | Freq: Two times a day (BID) | ORAL | 0 refills | Status: DC
  Filled 2021-04-10: qty 946, 16d supply, fill #0

## 2021-04-10 NOTE — Plan of Care (Signed)
  Problem: Education: Goal: Knowledge of General Education information will improve Description: Including pain rating scale, medication(s)/side effects and non-pharmacologic comfort measures Outcome: Progressing   Problem: Health Behavior/Discharge Planning: Goal: Ability to manage health-related needs will improve Outcome: Progressing   Problem: Clinical Measurements: Goal: Ability to maintain clinical measurements within normal limits will improve Outcome: Progressing Goal: Will remain free from infection Outcome: Progressing Goal: Cardiovascular complication will be avoided Outcome: Progressing   Problem: Activity: Goal: Risk for activity intolerance will decrease Outcome: Progressing   Problem: Nutrition: Goal: Adequate nutrition will be maintained Outcome: Progressing   Problem: Elimination: Goal: Will not experience complications related to bowel motility Outcome: Progressing Goal: Will not experience complications related to urinary retention Outcome: Progressing   Problem: Pain Managment: Goal: General experience of comfort will improve Outcome: Progressing   Problem: Safety: Goal: Ability to remain free from injury will improve Outcome: Progressing   Problem: Skin Integrity: Goal: Risk for impaired skin integrity will decrease Outcome: Progressing   

## 2021-04-10 NOTE — Discharge Summary (Signed)
Kyle Pitts:[id number] DOB: 08-23-1960 DOA: 04/04/2021  PCP: Default, Provider, MD  Admit date: 04/04/2021  Discharge date: 04/10/2021  Admitted From: Home   Disposition:  Home   Recommendations for Outpatient Follow-up:   Follow up with PCP in 1-2 weeks  PCP Please obtain BMP/CBC, 2 view CXR in 1week,  (see Discharge instructions)   PCP Please follow up on the following pending results: Better blood pressure, CMP closely adjust diuretic dose accordingly.  If blood pressure stabilizes add propanolol at low doses.  Needs close outpatient GI follow-up for advanced alcoholic cirrhosis.   Home Health: RN   Equipment/Devices: none  Consultations: None  Discharge Condition: Fair   CODE STATUS: Full    Diet Recommendation: Heart Healthy with strict 1.5 L fluid restriction per day.  Chief Complaint  Patient presents with   Shortness of Breath     Brief history of present illness from the day of admission and additional interim summary    Patient is a 60 y.o. male with history of EtOH cirrhosis with esophageal varices/portal hypertensive gastropathy-recent hospitalization (11/26-12/3) for upper GI bleeding with acute blood loss anemia and hepatorenal syndrome-presented to the ED on 12/16 with worsening abdominal distention and shortness of breath.  His last paracentesis was on 12/10 as an outpatient (6 L of acetic fluid was removed).  Patient was found to have very tense ascites-AKI-COVID-19 infection-and subsequently admitted to the hospitalist service.  See below for further details.                                                                  Hospital Course    Decompensated alcoholic liver cirrhosis with tense ascites: Ultrasound-guided paracentesis on 04/05/2021 with 8 L of fluid removed-repeat  paracentesis on 04/09/2021 with 5 L of fluid removed again.  No signs of SBP.  Received empiric Rocephin for SBP prophylaxis.  Much improved ascites now placed on low-dose Aldactone with caution, counseled on fluid and salt restriction.  Discharged home with close outpatient PCP and GI follow-up.     AKI: Concern for hepatorenal syndrome-not on diuretics-no history of vomiting/diarrhea/GI bleeding-do not see any offending medications. Recent UA on 11/26 with no proteinuria.  Recent CT scan on 11/26 without hydronephrosis/obstructive uropathy.  Seen by nephrology and was treated with midodrine, IV octreotide and albumin with good results, renal function much improved now at baseline.  Resume Aldactone from tomorrow with caution with close monitoring by PCP.   History of recent UGI bleeding due to gastric ulcers and acute blood loss anemia: Recent EGD on 11/30 showed multiple oozing gastric ulcers with an adherent clot.  Patient denies any history of hematochezia/melena/hematemesis.  Hemoglobin seems to be relatively stable-resume PPI twice daily dosing (8 weeks recommended per GI)-GI was planning to repeat second endoscopy in 8 weeks  as outpatient.  Follow-up with GI outpatient no acute issue this admission.   Hepatic encephalopathy: Resumed lactulose resolved counseled on compliance with lactulose.   Alcoholic liver cirrhosis with portal hypertensive gastropathy/esophageal varices: Not on any beta blocking agents-given AKI-soft BP-placing on low-dose Aldactone due to recurrent ascites, PCP to monitor blood pressure if it remains stable low-dose propanolol can be added.  Also follow closely with GI outpatient.   Normocytic anemia: Multifactorial-due to recent blood loss-underlying chronic liver disease.  Watch closely.   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  ECP to monitor in outpatient setting.   Right pretibial wound with infection: Resolved has  finished antibiotic treatment for this problem.  Stopped vancomycin and Rocephin transition to oral doxycycline for 5 more days upon discharge.  Home nursing if he qualifies for wound care.  Clinically this problem has resolved.   COVID-19 infection: Unvaccinated-apart from cough-no other symptoms-likely a incidental finding-we will plan on Remdesivir x3 days.  CXR without pneumonia.  Clinically resolved.    Discharge diagnosis     Principal Problem:   Alcoholic cirrhosis of liver with ascites (HCC) Active Problems:   Hepatorenal syndrome ([place])   COVID-19 virus infection   AKI (acute kidney injury) ([place])   Cellulitis of right leg   Pancytopenia ([place])   Transaminitis    Discharge instructions    Discharge Instructions     Discharge instructions   Complete by: As directed    Follow with Primary MD in 7 days   Get CBC, CMP, 2 view Chest X ray -  checked next visit within 1 week by Primary MD    Activity: As tolerated with Full fall precautions use walker/cane & assistance as needed  Disposition Home   Diet: Heart Healthy with strict 1.5 L fluid restriction per day  Special Instructions: If you have smoked or chewed Tobacco  in the last 2 yrs please stop smoking, stop any regular Alcohol  and or any Recreational drug use.  On your next visit with your primary care physician please Get Medicines reviewed and adjusted.  Please request your Prim.MD to go over all Hospital Tests and Procedure/Radiological results at the follow up, please get all Hospital records sent to your Prim MD by signing hospital release before you go home.  If you experience worsening of your admission symptoms, develop shortness of breath, life threatening emergency, suicidal or homicidal thoughts you must seek medical attention immediately by calling 911 or calling your MD immediately  if symptoms less severe.  You Must read complete instructions/literature along with all the possible adverse  reactions/side effects for all the Medicines you take and that have been prescribed to you. Take any new Medicines after you have completely understood and accpet all the possible adverse reactions/side effects.   Discharge wound care:   Complete by: As directed    Daily - Wound care to right pretibial wound: Cleanse with NS, pat dry. Cover with folded piece of size appropriate xeroform gauze Kellie Simmering [phone]). Top with dry gauze and secure with a few turns of Kerlix roll gauze/paper tape. Change twice daily.   Increase activity slowly   Complete by: As directed    MyChart COVID-19 home monitoring program   Complete by: Apr 10, 2021    Is the patient willing to use the [place] for home monitoring?: Yes   Temperature monitoring   Complete by: Apr 10, 2021    After how many days would you like to  receive a notification of this patient's flowsheet entries?: 1       Discharge Medications   Allergies as of 04/10/2021       Reactions   Latex Itching   Tape Itching   Amoxicillin Other (See Comments)   Itching around eyes and scalp on amoxicilin   Cephalexin Nausea Only   Upset stomach Ceftriaxone is ok   Penicillins Rash        Medication List     TAKE these medications    doxycycline 100 MG tablet Commonly known as: VIBRA-TABS Take 1 tablet (100 mg total) by mouth every 12 (twelve) hours.   lactulose (encephalopathy) 10 GM/15ML Soln Commonly known as: CHRONULAC Take 30 mLs (20 g total) by mouth 2 (two) times daily.   midodrine 10 MG tablet Commonly known as: PROAMATINE Take 1 tablet (10 mg total) by mouth 3 (three) times daily with meals. What changed:  medication strength how much to take   pantoprazole 40 MG tablet Commonly known as: PROTONIX Take 1 tablet (40 mg total) by mouth 2 (two) times daily.   spironolactone 25 MG tablet Commonly known as: ALDACTONE Take 1 tablet (25 mg total) by mouth daily. Start taking on: April 11, 2021                Discharge Care Instructions  (From admission, onward)           Start     Ordered   04/10/21 0000  Discharge wound care:       Comments: Daily - Wound care to right pretibial wound: Cleanse with NS, pat dry. Cover with folded piece of size appropriate xeroform gauze Kellie Simmering [phone]). Top with dry gauze and secure with a few turns of Kerlix roll gauze/paper tape. Change twice daily.   04/10/21 1127             Follow-up Information     [facility]. Go on 04/15/2021.   Specialty: Family Medicine Why: 12/27 1:30pm Contact information: [place] [phone]        Sharyn Creamer, MD. Schedule an appointment as soon as possible for a visit in 1 week(s).   Specialty: Gastroenterology Why: Alcoholic cirrhosis Contact information: [address] [place] [address] [phone]                 Major procedures and Radiology Reports - PLEASE review detailed and final reports thoroughly  -      Today   Subjective    Roshard Hudelson today has no headache,no chest abdominal pain,no new weakness tingling or numbness, feels much better wants to go home today.    Objective   Blood pressure 103/63, pulse 60, temperature 97.9 F (36.6 C), temperature source Oral, resp. rate 18, height 6' (1.829 m), weight 92 kg, SpO2 92 %.   Intake/Output Summary (Last 24 hours) at 04/10/2021 1127 Last data filed at 04/10/2021 0835 Gross per 24 hour  Intake --  Output 650 ml  Net -650 ml    Exam  Awake Alert, No new F.N deficits, Normal affect [place].AT,PERRAL Supple Neck,No JVD, No cervical lymphadenopathy appriciated.  Symmetrical Chest wall movement, Good air movement bilaterally, CTAB RRR,No Gallops,Rubs or new Murmurs, No Parasternal Heave +ve B.Sounds, Abd Soft minimal ascites, Non tender, No organomegaly appriciated, No rebound -guarding or rigidity. No Cyanosis, right lateral tibial  wound almost completely healed with no surrounding cellulitis   Data Review  CBC w Diff:  Lab Results  Component Value Date   WBC 4.8 04/08/2021   HGB 8.1 (L) 04/08/2021   HCT 23.7 (L) 04/08/2021   PLT 51 (L) 04/08/2021   LYMPHOPCT 34 04/08/2021   BANDSPCT 0 10/13/2020   MONOPCT 15 04/08/2021   EOSPCT 4 04/08/2021   BASOPCT 0 04/08/2021    CMP:  Lab Results  Component Value Date   NA 136 04/10/2021   K 3.7 04/10/2021   CL 110 04/10/2021   CO2 21 (L) 04/10/2021   BUN 7 04/10/2021   CREATININE 1.09 04/10/2021   CREATININE 0.52 (L) 11/21/2020   PROT 5.8 (L) 04/10/2021   ALBUMIN 3.6 04/10/2021   BILITOT 3.9 (H) 04/10/2021   ALKPHOS 85 04/10/2021   AST 40 04/10/2021   ALT 12 04/10/2021  .   Total Time in preparing paper work, data evaluation and todays exam - 64 minutes  Lala Lund M.D on 04/10/2021 at 11:27 AM  [facility]

## 2021-04-10 NOTE — TOC Transition Note (Addendum)
Transition of Care [facility]) - CM/SW Discharge Note   Patient Details  Name: Kyle Pitts MRN: [id number] Date of Birth: [birth year]  Transition of Care [facility] CM/SW Contact:  Harriet Masson, RN Phone Number: 04/10/2021, 11:45 AM   Clinical Narrative:    Patient stable for discharge. Patient has apt at [facility] clinic 12/27. Medications sent to [facility] at no cost. Patient states his brother can pick him up.  1635 Patient states brother is at work and he needs transportation. Transportation arranged for 5:30 through [place] site. RN made aware.   Final next level of care: Home/Self Care Barriers to Discharge: Barriers Resolved   Patient Goals and CMS Choice Patient states their goals for this hospitalization and ongoing recovery are:: return home      Discharge Placement                 home      Discharge Plan and Services                 home                    Social Determinants of Health (SDOH) Interventions     Readmission Risk Interventions Readmission Risk Prevention Plan 03/22/2021  Transportation Screening Complete  PCP or Specialist Appt within 3-5 Days Complete  HRI or Home Care Consult Complete  Social Work Consult for Recovery Care Planning/Counseling Complete  Palliative Care Screening Not Applicable  Medication Review [employer]) Complete  Some recent data might be hidden

## 2021-04-10 NOTE — Discharge Instructions (Signed)
Follow with Primary MD in 7 days   Get CBC, CMP, 2 view Chest X ray -  checked next visit within 1 week by Primary MD    Activity: As tolerated with Full fall precautions use walker/cane & assistance as needed  Disposition Home   Diet: Heart Healthy with strict 1.5 L fluid restriction per day  Special Instructions: If you have smoked or chewed Tobacco  in the last 2 yrs please stop smoking, stop any regular Alcohol  and or any Recreational drug use.  On your next visit with your primary care physician please Get Medicines reviewed and adjusted.  Please request your Prim.MD to go over all Hospital Tests and Procedure/Radiological results at the follow up, please get all Hospital records sent to your Prim MD by signing hospital release before you go home.  If you experience worsening of your admission symptoms, develop shortness of breath, life threatening emergency, suicidal or homicidal thoughts you must seek medical attention immediately by calling 911 or calling your MD immediately  if symptoms less severe.  You Must read complete instructions/literature along with all the possible adverse reactions/side effects for all the Medicines you take and that have been prescribed to you. Take any new Medicines after you have completely understood and accpet all the possible adverse reactions/side effects.

## 2021-04-15 ENCOUNTER — Inpatient Hospital Stay (INDEPENDENT_AMBULATORY_CARE_PROVIDER_SITE_OTHER): Payer: Self-pay | Admitting: Primary Care

## 2021-04-17 ENCOUNTER — Emergency Department (HOSPITAL_COMMUNITY): Payer: Medicaid Other

## 2021-04-17 ENCOUNTER — Emergency Department (HOSPITAL_COMMUNITY)
Admission: EM | Admit: 2021-04-17 | Discharge: 2021-04-18 | Disposition: A | Discharge status: Home / Self Care | Payer: Medicaid Other | Attending: Emergency Medicine | Admitting: Emergency Medicine

## 2021-04-17 DIAGNOSIS — K7031 Alcoholic cirrhosis of liver with ascites: Secondary | ICD-10-CM | POA: Insufficient documentation

## 2021-04-17 DIAGNOSIS — J45909 Unspecified asthma, uncomplicated: Secondary | ICD-10-CM | POA: Insufficient documentation

## 2021-04-17 DIAGNOSIS — R0602 Shortness of breath: Secondary | ICD-10-CM | POA: Diagnosis present

## 2021-04-17 DIAGNOSIS — Z91040 Latex allergy status: Secondary | ICD-10-CM | POA: Insufficient documentation

## 2021-04-17 DIAGNOSIS — Z8616 Personal history of COVID-19: Secondary | ICD-10-CM | POA: Diagnosis not present

## 2021-04-17 LAB — CBC WITH DIFFERENTIAL/PLATELET
Abs Immature Granulocytes: 0.01 10*3/uL (ref 0.00–0.07)
Basophils Absolute: 0 10*3/uL (ref 0.0–0.1)
Basophils Relative: 1 %
Eosinophils Absolute: 0.2 10*3/uL (ref 0.0–0.5)
Eosinophils Relative: 3 %
HCT: 21.7 % — ABNORMAL LOW (ref 39.0–52.0)
Hemoglobin: 7.3 g/dL — ABNORMAL LOW (ref 13.0–17.0)
Immature Granulocytes: 0 %
Lymphocytes Relative: 21 %
Lymphs Abs: 1.3 10*3/uL (ref 0.7–4.0)
MCH: 33 pg (ref 26.0–34.0)
MCHC: 33.6 g/dL (ref 30.0–36.0)
MCV: 98.2 fL (ref 80.0–100.0)
Monocytes Absolute: 0.8 10*3/uL (ref 0.1–1.0)
Monocytes Relative: 14 %
Neutro Abs: 3.6 10*3/uL (ref 1.7–7.7)
Neutrophils Relative %: 61 %
Platelets: 82 10*3/uL — ABNORMAL LOW (ref 150–400)
RBC: 2.21 MIL/uL — ABNORMAL LOW (ref 4.22–5.81)
RDW: 18.4 % — ABNORMAL HIGH (ref 11.5–15.5)
WBC: 5.9 10*3/uL (ref 4.0–10.5)
nRBC: 0 % (ref 0.0–0.2)

## 2021-04-17 LAB — COMPREHENSIVE METABOLIC PANEL
ALT: 14 U/L (ref 0–44)
AST: 45 U/L — ABNORMAL HIGH (ref 15–41)
Albumin: 3.3 g/dL — ABNORMAL LOW (ref 3.5–5.0)
Alkaline Phosphatase: 122 U/L (ref 38–126)
Anion gap: 6 (ref 5–15)
BUN: 18 mg/dL (ref 6–20)
CO2: 21 mmol/L — ABNORMAL LOW (ref 22–32)
Calcium: 8.7 mg/dL — ABNORMAL LOW (ref 8.9–10.3)
Chloride: 106 mmol/L (ref 98–111)
Creatinine, Ser: 1.29 mg/dL — ABNORMAL HIGH (ref 0.61–1.24)
GFR, Estimated: >60 mL/min (ref >60)
Glucose, Bld: 108 mg/dL — ABNORMAL HIGH (ref 70–99)
Potassium: 3.7 mmol/L (ref 3.5–5.1)
Sodium: 133 mmol/L — ABNORMAL LOW (ref 135–145)
Total Bilirubin: 7.3 mg/dL — ABNORMAL HIGH (ref 0.3–1.2)
Total Protein: 6.6 g/dL (ref 6.5–8.1)

## 2021-04-17 LAB — URINALYSIS, ROUTINE W REFLEX MICROSCOPIC
Bacteria, UA: NONE SEEN
Bilirubin Urine: NEGATIVE
Glucose, UA: NEGATIVE mg/dL
Hgb urine dipstick: NEGATIVE
Ketones, ur: NEGATIVE mg/dL
Leukocytes,Ua: NEGATIVE
Nitrite: NEGATIVE
Protein, ur: 30 mg/dL — AB
Specific Gravity, Urine: 1.021 (ref 1.005–1.030)
pH: 5 (ref 5.0–8.0)

## 2021-04-17 LAB — PROTIME-INR
INR: 2.1 — ABNORMAL HIGH (ref 0.8–1.2)
Prothrombin Time: 23.2 seconds — ABNORMAL HIGH (ref 11.4–15.2)

## 2021-04-17 LAB — APTT: aPTT: 51 seconds — ABNORMAL HIGH (ref 24–36)

## 2021-04-17 LAB — AMMONIA: Ammonia: 16 umol/L (ref 9–35)

## 2021-04-17 LAB — LIPASE, BLOOD: Lipase: 58 U/L — ABNORMAL HIGH (ref 11–51)

## 2021-04-17 NOTE — ED Triage Notes (Signed)
Pt here EMS with SOB and recurring ascites. Generalized edema jaundice.  Denies pain at this time.   146/86 HR 79 99% RA

## 2021-04-17 NOTE — ED Provider Notes (Signed)
Emergency Medicine Provider Triage Evaluation Note  Kyle Pitts , a 60 y.o. male  was evaluated in triage.  Pt complains of shortness of breath x 4 days. Associated recurring ascites, jaundice (3 months).  Has not tried any medications for his symptoms.  Denies fever, chills, nausea, vomiting, chest pain, abdominal pain.  Denies recent alcohol use.  Patient notes he quit on Halloween of this year.  Denies NSAID use.  Patient reports he was evaluated in the hospital multiple times recently with paracentesis completed both times.  Review of Systems  Positive: Jaundice, abdominal distention Negative: Abdominal pain, fever, chills  Physical Exam  There were no vitals taken for this visit. Gen:   Awake, no distress   Resp:  Normal effort  MSK:   Moves extremities without difficulty  Other:  Moderate abdominal distention without tenderness to palpation on exam.  Mild jaundice noted on exam.  2+ pitting edema bilaterally to lower extremities.  Medical Decision Making  Medically screening exam initiated at 5:15 PM.  Appropriate orders placed.  Kyle Pitts was informed that the remainder of the evaluation will be completed by another provider, this initial triage assessment does not replace that evaluation, and the importance of remaining in the ED until their evaluation is complete.   Kyle Pitts A, PA-C 04/17/21 1757    Kyle Loveless, MD 04/17/21 2231

## 2021-04-18 ENCOUNTER — Telehealth: Payer: Self-pay | Admitting: Surgery

## 2021-04-18 ENCOUNTER — Telehealth: Payer: Self-pay | Admitting: Gastroenterology

## 2021-04-18 DIAGNOSIS — K7290 Hepatic failure, unspecified without coma: Secondary | ICD-10-CM

## 2021-04-18 DIAGNOSIS — K7031 Alcoholic cirrhosis of liver with ascites: Secondary | ICD-10-CM

## 2021-04-18 NOTE — Telephone Encounter (Signed)
Kyle Pitts,  Please place IR consult for outpatient therapeutic paracentesis and place orders for cell count, gram stain/culture as we do not offer paracentesis here in our clinic.  Patient should be taking the lasix and aldactone he is prescribed, and he should be following a low sodium diet.  I would like to see him in clinic as soon as possible.   Thank you ,  Dr. Salena Saner

## 2021-04-18 NOTE — Telephone Encounter (Signed)
Patient has been scheduled for a paracentesis at [facility] on Tuesday, 04/22/21 at 1 pm. Pt will need to arrive at [facility] at 12:30 pm. Patient has been advised to go pick up Aldactone prescription from the pharmacy and begin taking that because that will help keep some of the fluid down. Pt is aware that he should follow a low sodium diet. Pt is aware that he should be avoiding canned foods, processed meats, etc. Pt is already scheduled for a follow up on 05/14/21 at 2:30 pm. Pt verbalized understanding and had no concerns at the end of the call.

## 2021-04-18 NOTE — ED Provider Notes (Signed)
[facility] EMERGENCY DEPARTMENT Provider Note   CSN: [id number] Arrival date & time: 04/17/21  1647     History Chief Complaint  Patient presents with   Shortness of Breath    Kyle Pitts is a 60 y.o. male.  The history is provided by the patient.  Shortness of Breath Severity:  Moderate Onset quality:  Gradual Duration:  4 days Timing:  Intermittent Progression:  Worsening Chronicity:  Recurrent Relieved by:  Rest Worsened by:  Activity Associated symptoms: cough   Associated symptoms: no chest pain, no fever and no vomiting   Patient history of cirrhosis presents with shortness of breath.  Patient reports increasing shortness of breath due to abdominal distention.  He reports it impedes on his diaphragm and cause him to feel out of breath.  No fever/vomiting/chest pain.  He reports some lower extremity edema.  He reports that he needs to have his abdominal cavity drained    Past Medical History:  Diagnosis Date   Allergic reaction 11/21/2020   Cirrhosis (HCC)    Hyponatremia    Myofasciitis 11/21/2020   Osteomyelitis of right leg (HCC) 11/21/2020   Septic arthritis (HCC)    Streptococcus infection, group A 11/21/2020    Patient Active Problem List   Diagnosis Date Noted   COVID-19 virus infection 04/04/2021   AKI (acute kidney injury) (HCC) 04/04/2021   Cellulitis of right leg 04/04/2021   Pancytopenia (HCC) 04/04/2021   Transaminitis 04/04/2021   Hepatorenal syndrome (HCC)    Metabolic acidosis 03/16/2021   Hepatic encephalopathy 03/15/2021   Severe protein-calorie malnutrition (HCC) 03/15/2021   Thrombocytopenia (HCC) 03/15/2021   Alcoholic cirrhosis of liver with ascites (HCC)    Esophageal varices in alcoholic cirrhosis (HCC)    Gastric ulcer due to nonsteroidal anti-inflammatory drug (NSAID)    Duodenal ulcer    Portal hypertensive gastropathy (HCC)    Acute blood loss anemia    Acute renal failure (ARF) (HCC) 02/13/2021    Elevated LFTs 02/13/2021   Allergic reaction 11/21/2020   Osteomyelitis of right leg (HCC) 11/21/2020   Streptococcus infection, group A 11/21/2020   Myofasciitis 11/21/2020   PICC line infection 11/08/2020   Anemia 11/08/2020   Decompensated hepatic cirrhosis (HCC) 10/07/2020   Hyponatremia 10/07/2020   Leucocytosis 10/07/2020   Cellulitis and abscess of right leg 10/07/2020   Septic arthritis of knee (HCC) 09/28/2020   ALLERGIC RHINITIS 01/13/2007   ASTHMA 01/13/2007    Past Surgical History:  Procedure Laterality Date   BIOPSY  02/15/2021   Procedure: BIOPSY;  Surgeon: Jenel Lucks, MD;  Location: [facility] ENDOSCOPY;  Service: Gastroenterology;;   ESOPHAGOGASTRODUODENOSCOPY N/A 02/15/2021   Procedure: ESOPHAGOGASTRODUODENOSCOPY (EGD);  Surgeon: Jenel Lucks, MD;  Location: [facility] ENDOSCOPY;  Service: Gastroenterology;  Laterality: N/A;   ESOPHAGOGASTRODUODENOSCOPY (EGD) WITH PROPOFOL N/A 03/19/2021   Procedure: ESOPHAGOGASTRODUODENOSCOPY (EGD) WITH PROPOFOL;  Surgeon: Imogene Burn, MD;  Location: [facility] ENDOSCOPY;  Service: Gastroenterology;  Laterality: N/A;   HEMOSTASIS CLIP PLACEMENT  03/19/2021   Procedure: HEMOSTASIS CLIP PLACEMENT;  Surgeon: Imogene Burn, MD;  Location: [facility] ENDOSCOPY;  Service: Gastroenterology;;   I & D EXTREMITY Right 10/09/2020   Procedure: IRRIGATION AND DEBRIDEMENT EXTREMITY;  Surgeon: Yolonda Kida, MD;  Location: [facility] ORS;  Service: Orthopedics;  Laterality: Right;   IR PARACENTESIS  03/18/2021   IR PARACENTESIS  04/09/2021   KNEE ARTHROSCOPY Right 09/27/2020   Procedure: ARTHROSCOPY KNEE, WASH OUT;  Surgeon: Venita Lick, MD;  Location: [facility] ORS;  Service:  Orthopedics;  Laterality: Right;   KNEE ARTHROSCOPY Right 10/09/2020   Procedure: ARTHROSCOPY KNEE,ARTHROSCOPIC I & D, CALF ABSCESS;  Surgeon: Yolonda Kida, MD;  Location: [facility] ORS;  Service: Orthopedics;  Laterality: Right;       Family History  Problem Relation Age of Onset    Hepatitis Mother    Cirrhosis Mother    Stroke Father    Heart failure Father    Kidney failure Brother     Social History   Tobacco Use   Smoking status: Never   Smokeless tobacco: Never  Vaping Use   Vaping Use: Never used  Substance Use Topics   Alcohol use: Yes    Alcohol/week: 24.0 standard drinks    Types: 24 Cans of beer per week    Comment: 3-4 when home from work   Drug use: Not Currently    Types: Cocaine, Marijuana    Comment: last used 1 month ago    Home Medications Prior to Admission medications   Medication Sig Start Date End Date Taking? Authorizing Provider  doxycycline (VIBRA-TABS) 100 MG tablet Take 1 tablet (100 mg total) by mouth every 12 (twelve) hours. 04/10/21   Leroy Sea, MD  lactulose, encephalopathy, (CHRONULAC) 10 GM/15ML SOLN Take 30 mLs (20 g total) by mouth 2 (two) times daily. 04/10/21   Leroy Sea, MD  midodrine (PROAMATINE) 10 MG tablet Take 1 tablet (10 mg total) by mouth 3 (three) times daily with meals. 04/10/21   Leroy Sea, MD  pantoprazole (PROTONIX) 40 MG tablet Take 1 tablet (40 mg total) by mouth 2 (two) times daily. 03/21/21 05/20/21  Joseph Art, DO  spironolactone (ALDACTONE) 25 MG tablet Take 1 tablet (25 mg total) by mouth daily. 04/11/21   Leroy Sea, MD    Allergies    Latex, Tape, Amoxicillin, Cephalexin, and Penicillins  Review of Systems   Review of Systems  Constitutional:  Negative for fever.  Respiratory:  Positive for cough and shortness of breath.   Cardiovascular:  Positive for leg swelling. Negative for chest pain.  Gastrointestinal:  Negative for vomiting.  All other systems reviewed and are negative.  Physical Exam Updated Vital Signs BP 101/76    Pulse 64    Temp 97.8 F (36.6 C) (Oral)    Resp 20    SpO2 99%   Physical Exam CONSTITUTIONAL: Chronically ill-appearing, no acute distress HEAD: Normocephalic/atraumatic EYES: EOMI/PERRL, scleral icterus ENMT: Mucous membranes  moist NECK: supple no meningeal signs SPINE/BACK:entire spine nontender CV: S1/S2 noted LUNGS: Lungs are clear to auscultation bilaterally, no apparent distress ABDOMEN: soft, nontender, no rebound or guarding, bowel sounds noted throughout abdomen, mild distention noted GU:no cva tenderness NEURO: Pt is awake/alert/appropriate, moves all extremitiesx4.  No facial droop.   EXTREMITIES: pulses normal/equal, full ROM, minimal edema to bilateral lower extremities SKIN: warm, jaundiced PSYCH: no abnormalities of mood noted, alert and oriented to situation  ED Results / Procedures / Treatments   Labs (all labs ordered are listed, but only abnormal results are displayed) Labs Reviewed  COMPREHENSIVE METABOLIC PANEL - Abnormal; Notable for the following components:      Result Value   Sodium 133 (*)    CO2 21 (*)    Glucose, Bld 108 (*)    Creatinine, Ser 1.29 (*)    Calcium 8.7 (*)    Albumin 3.3 (*)    AST 45 (*)    Total Bilirubin 7.3 (*)    All other components within normal  limits  LIPASE, BLOOD - Abnormal; Notable for the following components:   Lipase 58 (*)    All other components within normal limits  CBC WITH DIFFERENTIAL/PLATELET - Abnormal; Notable for the following components:   RBC 2.21 (*)    Hemoglobin 7.3 (*)    HCT 21.7 (*)    RDW 18.4 (*)    Platelets 82 (*)    All other components within normal limits  URINALYSIS, ROUTINE W REFLEX MICROSCOPIC - Abnormal; Notable for the following components:   Color, Urine AMBER (*)    Protein, ur 30 (*)    All other components within normal limits  PROTIME-INR - Abnormal; Notable for the following components:   Prothrombin Time 23.2 (*)    INR 2.1 (*)    All other components within normal limits  APTT - Abnormal; Notable for the following components:   aPTT 51 (*)    All other components within normal limits  AMMONIA    EKG EKG Interpretation  Date/Time:  Thursday April 17 2021 17:02:02 EST Ventricular Rate:   83 PR Interval:  154 QRS Duration: 88 QT Interval:  372 QTC Calculation: 437 R Axis:   15 Text Interpretation: Sinus rhythm with marked sinus arrhythmia Low voltage QRS Cannot rule out Anterior infarct , age undetermined Abnormal ECG When compared with ECG of 04-Apr-2021 15:49, PREVIOUS ECG IS PRESENT Interpretation limited secondary to artifact Confirmed by Zadie Rhine ([id number]) on 04/17/2021 11:12:31 PM  Radiology DG Chest 2 View  Result Date: 04/17/2021 CLINICAL DATA:  Short of breath. EXAM: CHEST - 2 VIEW COMPARISON:  04/06/2021 FINDINGS: Heart size and vascularity normal. Mild bibasilar atelectasis or scarring. Negative for pneumonia or effusion. IMPRESSION: No active cardiopulmonary disease. Electronically Signed   By: Marlan Palau M.D.   On: 04/17/2021 18:31    Procedures Procedures   Medications Ordered in ED Medications - No data to display  ED Course  I have reviewed the triage vital signs and the nursing notes.  Pertinent labs & imaging results that were available during my care of the patient were reviewed by me and considered in my medical decision making (see chart for details).  Clinical Course as of 04/18/21 0537  Fri Apr 18, 2021  0048 Labs reveal chronic anemia, anticoagulated [DW]  0051 Hyperbilirubinemia noted [DW]    Clinical Course User Index [DW] Zadie Rhine, MD   MDM Rules/Calculators/A&P                         Patient w/history of alcoholic cirrhosis presents for seventh ER visit in 6 months.  Patient comes in frequently with abdominal distention requiring paracentesis.  He reports he is only short of breath due to abdominal distention.  On my exam patient is in no acute distress.  He is resting comfortably, no respiratory stress, no hypoxia.  I personally reviewed his chest x-ray is negative All the labs are abnormal, they are at his baseline. He has mild abdominal distention but is not as severe as previous episode I stressed the importance  of patient having this performed as an outpatient rather than come to the ER for his treatment At this point he does not require large-volume or emergent paracentesis.  He has no fever or abdominal pain to suggest spontaneous bacterial peritonitis He will call gastroenterology today.  I have also placed a consult for social work to assist patient with PCP follow-up to ensure that his care can be placed in the outside  the hospital    Final Clinical Impression(s) / ED Diagnoses Final diagnoses:  Ascites due to alcoholic cirrhosis [facility])    Rx / DC Orders ED Discharge Orders     None        Zadie Rhine, MD 04/18/21 [phone]

## 2021-04-18 NOTE — Telephone Encounter (Signed)
Called and spoke with patient. He states that his belly is hard and distended. He states that he has not weighed himself but his belly has "grown". Pt states that he did not start the Aldactone because he didn't know that they prescribed that for him. He is taking Lactulose as prescribed. He is only having about 1 BM a day and the last few have been hard. Pt was seen in the ER and they recommended that he contact us to set up outpatient paracentesis. Please advise, thanks.

## 2021-04-18 NOTE — Telephone Encounter (Signed)
ED RNCM attempted to contact patient by phone no VM set up. As per ED [facility] consult to discuss assistance with establishing care, Patient was referred to the [facility] upon discharge from hospital 12/20. Will make a second attempt later 04/20/2021

## 2021-04-18 NOTE — Telephone Encounter (Signed)
Dr. Tomasa Rand saw this patient in the hospital.  Patient went to the ER where he tried to have fluid drawn off his belly.  He is very distended, uncomfortable, and it's making it hard to breathe for him.  The ER suggested he call our office to set up paracenteses on an outpatient basis.  He has an appointment scheduled with Dr. Tomasa Rand on 05/14/21 at 2:30 p.m., but he will not be able to wait that long to get some relief.  Please call patient and advise.  Thank you.

## 2021-04-22 ENCOUNTER — Ambulatory Visit (HOSPITAL_COMMUNITY)
Admission: RE | Admit: 2021-04-22 | Discharge: 2021-04-22 | Disposition: A | Discharge status: Home / Self Care | Payer: Medicaid Other | Source: Ambulatory Visit | Attending: Gastroenterology | Admitting: Gastroenterology

## 2021-04-22 ENCOUNTER — Other Ambulatory Visit (HOSPITAL_COMMUNITY): Payer: Self-pay

## 2021-04-22 ENCOUNTER — Other Ambulatory Visit: Payer: Self-pay

## 2021-04-22 DIAGNOSIS — K7031 Alcoholic cirrhosis of liver with ascites: Secondary | ICD-10-CM | POA: Insufficient documentation

## 2021-04-22 DIAGNOSIS — K7460 Unspecified cirrhosis of liver: Secondary | ICD-10-CM

## 2021-04-22 HISTORY — PX: IR PARACENTESIS: IMG2679

## 2021-04-22 LAB — BODY FLUID CELL COUNT WITH DIFFERENTIAL
Eos, Fluid: 0 %
Lymphs, Fluid: 41 %
Monocyte-Macrophage-Serous Fluid: 59 % (ref 50–90)
Neutrophil Count, Fluid: 0 % (ref 0–25)
Total Nucleated Cell Count, Fluid: 134 cu mm (ref 0–1000)

## 2021-04-22 LAB — GRAM STAIN

## 2021-04-22 MED ORDER — ALBUMIN HUMAN 25 % IV SOLN
INTRAVENOUS | Status: AC
  Administered 2021-04-22: 50 g via INTRAVENOUS
  Filled 2021-04-22: qty 200

## 2021-04-22 MED ORDER — ALBUMIN HUMAN 25 % IV SOLN: 50.0000 g | Freq: Once | INTRAVENOUS | Status: AC

## 2021-04-22 MED ORDER — LIDOCAINE HCL 1 % IJ SOLN
INTRAMUSCULAR | Status: AC
  Filled 2021-04-22: qty 20

## 2021-04-22 MED ORDER — LIDOCAINE HCL 1 % IJ SOLN
INTRAMUSCULAR | Status: DC | PRN
  Administered 2021-04-22: 20 mL

## 2021-04-22 NOTE — Telephone Encounter (Signed)
Inbound call from patient states the medication to help with fluid was not at [facility]

## 2021-04-22 NOTE — Procedures (Signed)
PROCEDURE SUMMARY:  Successful US guided paracentesis from right abdomen.  Yielded 6.6 L of clear yellow fluid.  No immediate complications.  Pt tolerated well.   Specimen sent for labs.  EBL < 2 mL  Theresa Duty, NP 04/22/2021 3:04 PM

## 2021-04-23 ENCOUNTER — Other Ambulatory Visit: Payer: Self-pay

## 2021-04-23 LAB — PATHOLOGIST SMEAR REVIEW

## 2021-04-23 MED ORDER — SPIRONOLACTONE 25 MG PO TABS: 50.0000 mg | ORAL_TABLET | Freq: Every day | ORAL | 3 refills | Status: DC

## 2021-04-27 LAB — CULTURE, BODY FLUID W GRAM STAIN -BOTTLE: Culture: NO GROWTH

## 2021-04-29 ENCOUNTER — Telehealth: Payer: Self-pay | Admitting: Gastroenterology

## 2021-04-29 NOTE — Telephone Encounter (Signed)
Inbound call from patient states he is swelled pretty bad and in need of a paracentesis

## 2021-04-30 ENCOUNTER — Other Ambulatory Visit: Payer: Self-pay | Admitting: Gastroenterology

## 2021-04-30 MED ORDER — FUROSEMIDE 40 MG PO TABS: 40.0000 mg | ORAL_TABLET | Freq: Every day | ORAL | 3 refills | Status: DC

## 2021-04-30 NOTE — Telephone Encounter (Signed)
Pt called back and is requesting para asap. He states the fluid has already come back and when he lays down he feels as if it is coming up in his throat. Please advise how much fluid can be drawn off, if and how much albumin you want given and if you want and labs.

## 2021-04-30 NOTE — Telephone Encounter (Signed)
Patient called and said he is in need of a paracentesis.  He is extremely swollen and feels the liquid up in his throat.  He is asking if this can be set up ASAP as an outpatient or if he should call 911.  He is in extreme distress.  Please call and advise.

## 2021-05-01 ENCOUNTER — Other Ambulatory Visit: Payer: Self-pay

## 2021-05-01 DIAGNOSIS — K7031 Alcoholic cirrhosis of liver with ascites: Secondary | ICD-10-CM

## 2021-05-01 NOTE — Telephone Encounter (Signed)
Orders entered per Dr. Tomasa Rand. Pt scheduled for IR para at [facility] 05/02/21@10am , pt to arrive there at 9:45am.

## 2021-05-02 ENCOUNTER — Other Ambulatory Visit: Payer: Self-pay

## 2021-05-02 ENCOUNTER — Ambulatory Visit (HOSPITAL_COMMUNITY)
Admission: RE | Admit: 2021-05-02 | Discharge: 2021-05-02 | Disposition: A | Discharge status: Home / Self Care | Payer: Medicaid Other | Source: Ambulatory Visit | Attending: Gastroenterology | Admitting: Gastroenterology

## 2021-05-02 DIAGNOSIS — K7031 Alcoholic cirrhosis of liver with ascites: Secondary | ICD-10-CM | POA: Insufficient documentation

## 2021-05-02 HISTORY — PX: IR PARACENTESIS: IMG2679

## 2021-05-02 LAB — BODY FLUID CELL COUNT WITH DIFFERENTIAL
Eos, Fluid: 0 %
Lymphs, Fluid: 66 %
Monocyte-Macrophage-Serous Fluid: 34 % — ABNORMAL LOW (ref 50–90)
Neutrophil Count, Fluid: 0 % (ref 0–25)
Total Nucleated Cell Count, Fluid: 44 cu mm (ref 0–1000)

## 2021-05-02 LAB — GRAM STAIN

## 2021-05-02 MED ORDER — LIDOCAINE HCL 1 % IJ SOLN
INTRAMUSCULAR | Status: AC
  Administered 2021-05-02: 10 mL
  Filled 2021-05-02: qty 20

## 2021-05-02 MED ORDER — ALBUMIN HUMAN 25 % IV SOLN
50.0000 g | Freq: Once | INTRAVENOUS | Status: AC
  Filled 2021-05-02: qty 200

## 2021-05-02 MED ORDER — ALBUMIN HUMAN 25 % IV SOLN
INTRAVENOUS | Status: AC
  Administered 2021-05-02: 50 g via INTRAVENOUS
  Filled 2021-05-02: qty 200

## 2021-05-02 NOTE — Procedures (Signed)
Ultrasound-guided diagnostic and therapeutic paracentesis performed yielding 7 liters of straw colored fluid.  Fluid was sent to lab for analysis. No immediate complications. EBL is none.  

## 2021-05-02 NOTE — Procedures (Signed)
Ultrasound-guided diagnostic and therapeutic paracentesis performed yielding 7.2 liters of straw colored fluid.  Fluid was sent to lab for analysis. No immediate complications. EBL is none.

## 2021-05-06 LAB — PATHOLOGIST SMEAR REVIEW

## 2021-05-07 LAB — CULTURE, BODY FLUID W GRAM STAIN -BOTTLE: Culture: NO GROWTH

## 2021-05-14 ENCOUNTER — Encounter: Payer: Self-pay | Admitting: Gastroenterology

## 2021-05-14 ENCOUNTER — Other Ambulatory Visit (INDEPENDENT_AMBULATORY_CARE_PROVIDER_SITE_OTHER): Payer: Medicaid Other

## 2021-05-14 ENCOUNTER — Ambulatory Visit (INDEPENDENT_AMBULATORY_CARE_PROVIDER_SITE_OTHER): Payer: Medicaid Other | Admitting: Gastroenterology

## 2021-05-14 VITALS — BP 100/68 | HR 97 | Ht 72.0 in | Wt 206.5 lb

## 2021-05-14 DIAGNOSIS — K7030 Alcoholic cirrhosis of liver without ascites: Secondary | ICD-10-CM | POA: Diagnosis not present

## 2021-05-14 DIAGNOSIS — Z1212 Encounter for screening for malignant neoplasm of rectum: Secondary | ICD-10-CM

## 2021-05-14 DIAGNOSIS — Z1211 Encounter for screening for malignant neoplasm of colon: Secondary | ICD-10-CM

## 2021-05-14 DIAGNOSIS — K259 Gastric ulcer, unspecified as acute or chronic, without hemorrhage or perforation: Secondary | ICD-10-CM

## 2021-05-14 DIAGNOSIS — K7031 Alcoholic cirrhosis of liver with ascites: Secondary | ICD-10-CM

## 2021-05-14 DIAGNOSIS — I8510 Secondary esophageal varices without bleeding: Secondary | ICD-10-CM

## 2021-05-14 DIAGNOSIS — K31819 Angiodysplasia of stomach and duodenum without bleeding: Secondary | ICD-10-CM

## 2021-05-14 DIAGNOSIS — Z87448 Personal history of other diseases of urinary system: Secondary | ICD-10-CM

## 2021-05-14 LAB — HEPATIC FUNCTION PANEL
ALT: 15 U/L (ref 0–53)
AST: 45 U/L — ABNORMAL HIGH (ref 0–37)
Albumin: 2.9 g/dL — ABNORMAL LOW (ref 3.5–5.2)
Alkaline Phosphatase: 160 U/L — ABNORMAL HIGH (ref 39–117)
Bilirubin, Direct: 2.3 mg/dL — ABNORMAL HIGH (ref 0.0–0.3)
Total Bilirubin: 4.6 mg/dL — ABNORMAL HIGH (ref 0.2–1.2)
Total Protein: 7.2 g/dL (ref 6.0–8.3)

## 2021-05-14 LAB — BASIC METABOLIC PANEL
BUN: 16 mg/dL (ref 6–23)
CO2: 28 mEq/L (ref 19–32)
Calcium: 8.5 mg/dL (ref 8.4–10.5)
Chloride: 97 mEq/L (ref 96–112)
Creatinine, Ser: 1.28 mg/dL (ref 0.40–1.50)
GFR: 61.42 mL/min (ref >60.00)
Glucose, Bld: 91 mg/dL (ref 70–99)
Potassium: 3.8 mEq/L (ref 3.5–5.1)
Sodium: 130 mEq/L — ABNORMAL LOW (ref 135–145)

## 2021-05-14 LAB — CBC WITH DIFFERENTIAL/PLATELET
Basophils Absolute: 0 10*3/uL (ref 0.0–0.1)
Basophils Relative: 0.5 % (ref 0.0–3.0)
Eosinophils Absolute: 0.2 10*3/uL (ref 0.0–0.7)
Eosinophils Relative: 2.6 % (ref 0.0–5.0)
HCT: 25.3 % — ABNORMAL LOW (ref 39.0–52.0)
Hemoglobin: 8.5 g/dL — ABNORMAL LOW (ref 13.0–17.0)
Lymphocytes Relative: 16.9 % (ref 12.0–46.0)
Lymphs Abs: 1.3 10*3/uL (ref 0.7–4.0)
MCHC: 33.6 g/dL (ref 30.0–36.0)
MCV: 102.3 fl — ABNORMAL HIGH (ref 78.0–100.0)
Monocytes Absolute: 0.7 10*3/uL (ref 0.1–1.0)
Monocytes Relative: 9.9 % (ref 3.0–12.0)
Neutro Abs: 5.3 10*3/uL (ref 1.4–7.7)
Neutrophils Relative %: 70.1 % (ref 43.0–77.0)
Platelets: 152 10*3/uL (ref 150.0–400.0)
RBC: 2.47 Mil/uL — ABNORMAL LOW (ref 4.22–5.81)
RDW: 18 % — ABNORMAL HIGH (ref 11.5–15.5)
WBC: 7.6 10*3/uL (ref 4.0–10.5)

## 2021-05-14 LAB — PROTIME-INR
INR: 1.8 ratio — ABNORMAL HIGH (ref 0.8–1.0)
Prothrombin Time: 18.9 s — ABNORMAL HIGH (ref 9.6–13.1)

## 2021-05-14 MED ORDER — SPIRONOLACTONE 25 MG PO TABS: 50.0000 mg | ORAL_TABLET | Freq: Every day | ORAL | 3 refills | Status: DC

## 2021-05-14 MED ORDER — PANTOPRAZOLE SODIUM 40 MG PO TBEC: 40.0000 mg | DELAYED_RELEASE_TABLET | Freq: Two times a day (BID) | ORAL | 3 refills | Status: DC

## 2021-05-14 MED ORDER — FUROSEMIDE 40 MG PO TABS
40.0000 mg | ORAL_TABLET | Freq: Every day | ORAL | 3 refills | Status: DC
Start: 2021-05-14 — End: 2021-12-05

## 2021-05-14 NOTE — Patient Instructions (Addendum)
If you are age 60 or older, your body mass index should be between 23-30. Your Body mass index is 28.01 kg/m. If this is out of the aforementioned range listed, please consider follow up with your Primary Care Provider.  If you are age 10 or younger, your body mass index should be between 19-25. Your Body mass index is 28.01 kg/m. If this is out of the aformentioned range listed, please consider follow up with your Primary Care Provider.   You have been scheduled for an abdominal paracentesis at [facility] radiology (1st floor of hospital) on 05/16/21 at 10:30am. Please arrive at least 15 minutes prior to your appointment time for registration. Should you need to reschedule this appointment for any reason, please call our office at [phone].  Your provider has requested that you go to the basement level for lab work before leaving today. Press "B" on the elevator. The lab is located at the first door on the left as you exit the elevator.   We have sent the following medications to your pharmacy for you to pick up at your convenience: Protonix 40 mg twice daily until EGD. Lasix 40 mg daily. Aldactone 50 mg daily at bedtime.  We will you to schedule Endo/Colon at [facility].    The [facility] providers would like to encourage you to use [facility] to communicate with providers for non-urgent requests or questions.  Due to long hold times on the telephone, sending your provider a message by [facility] may be a faster and more efficient way to get a response.  Please allow 48 business hours for a response.  Please remember that this is for non-urgent requests.   It was a pleasure to see you today!  Thank you for trusting me with your gastrointestinal care!    Scott E. Candis Schatz , MD

## 2021-05-14 NOTE — Progress Notes (Signed)
HPI : Kyle Pitts is a very pleasant 61 year old male with recently diagnosed decompensated alcoholic cirrhosis with hospitalizations in October and November in which he manifested upper GI bleeding from gastric ulcers, ascites, severe acute kidney injury concerning for HRS, and hepatic encephalopathy.  He was noted to have Grade esophageal varices without bleeding stigmata. Since his discharge at the end of November he has had to get large volume therapeutic paracentesis 3 times.  His diuretics were initially held at discharge due to renal insufficiency, but have been restarted at low doses.  He is currently taking lasix 40 mg PO daily and is prescribed aldactone 50 mg PO daily, but he has not been taking the aldactone (says he wasn't sure if he was supposed to take both, and one time he got very dizzy when he took them all together.  His last paracentesis was January 13, and he is already reaccumulating lots of fluid.  He says he knows to follow a low sodium diet, but it does not seem he is following it strictly (he was unaware that processed meats content high amounts of sodium).  He was started on midodrine during his last hospitalization, and he continues to take this 2-3x/day. He was encephalopathic in the hospital and was started on lactulose and he takes this, but typically only 1-2/day, typically with one bowel movement daily.  He denies any episodes of confusion, slowness of thought or speech.   He reports feeling well other than the abdominal swelling.  He had been having burning epigastric pain which was attributed the his peptic ulcer, but this has resolved.  He stopped taking Mobic which was the most likely cause of his ulcer.  He ran out of his Protonix a couple of weeks ago.  He needs a repeat EGD to assess healing of the gastric ulcer. He reports soft bowel brown movements without blood/melena.  His appetite is good, no nausea or vomiting.  No itching/pruritis.   He reports that his last  drink of alcohol was in October before his admission.  He reported that he has been unable to work since June because of orthopedic complications and he started drinking more when he was at home and not able to work.  The patient is interested in liver transplantation if he is a candidate.  His father died from colon cancer.  He has never had a colonoscopy   Past Medical History:  Diagnosis Date   Allergic reaction 11/21/2020   Cirrhosis ([place])    Hyponatremia    Myofasciitis 11/21/2020   Osteomyelitis of right leg ([place]) 11/21/2020   Septic arthritis ([place])    Streptococcus infection, group A 11/21/2020   EGD: Oct 29th:  Grade II esophageal varices, no stigmata, portal hypertensive gastropathy, clean based cratered prepyloric gastric ulcer, duodenal ulcers  EGD: Nov 30th:  Grade II esophageal varices, no stigmata, portal hypertensive gastropathy, multiple gastric ulcers with adherent clot/oozing, treated with hemostatic clip x2, GAVE, not treated  Past Surgical History:  Procedure Laterality Date   BIOPSY  02/15/2021   Procedure: BIOPSY;  Surgeon: Daryel November, MD;  Location: [place];  Service: Gastroenterology;;   ESOPHAGOGASTRODUODENOSCOPY N/A 02/15/2021   Procedure: ESOPHAGOGASTRODUODENOSCOPY (EGD);  Surgeon: Daryel November, MD;  Location: [place];  Service: Gastroenterology;  Laterality: N/A;   ESOPHAGOGASTRODUODENOSCOPY (EGD) WITH PROPOFOL N/A 03/19/2021   Procedure: ESOPHAGOGASTRODUODENOSCOPY (EGD) WITH PROPOFOL;  Surgeon: Sharyn Creamer, MD;  Location: [place];  Service: Gastroenterology;  Laterality: N/A;   HEMOSTASIS CLIP  PLACEMENT  03/19/2021   Procedure: HEMOSTASIS CLIP PLACEMENT;  Surgeon: Sharyn Creamer, MD;  Location: [facility];  Service: Gastroenterology;;   I & D EXTREMITY Right 10/09/2020   Procedure: IRRIGATION AND DEBRIDEMENT EXTREMITY;  Surgeon: Nicholes Stairs, MD;  Location: [facility];  Service: Orthopedics;  Laterality: Right;   IR  PARACENTESIS  03/18/2021   IR PARACENTESIS  04/09/2021   IR PARACENTESIS  04/22/2021   IR PARACENTESIS  05/02/2021   KNEE ARTHROSCOPY Right 09/27/2020   Procedure: ARTHROSCOPY KNEE, [place];  Surgeon: Melina Schools, MD;  Location: [facility];  Service: Orthopedics;  Laterality: Right;   KNEE ARTHROSCOPY Right 10/09/2020   Procedure: ARTHROSCOPY KNEE,ARTHROSCOPIC I & D, CALF ABSCESS;  Surgeon: Nicholes Stairs, MD;  Location: [facility];  Service: Orthopedics;  Laterality: Right;   Family History  Problem Relation Age of Onset   Hepatitis Mother    Cirrhosis Mother    Stroke Father    Heart failure Father    Colon cancer Father    Kidney failure Brother    Liver cancer Brother    Stomach cancer Neg Hx    Esophageal cancer Neg Hx    Social History   Tobacco Use   Smoking status: Never   Smokeless tobacco: Never  Vaping Use   Vaping Use: Never used  Substance Use Topics   Alcohol use: Yes    Alcohol/week: 24.0 standard drinks    Types: 24 Cans of beer per week    Comment: 3-4 when home from work   Drug use: Not Currently    Types: Cocaine, Marijuana    Comment: last used 1 month ago   Current Outpatient Medications  Medication Sig Dispense Refill   furosemide (LASIX) 40 MG tablet Take 1 tablet (40 mg total) by mouth daily. 90 tablet 3   lactulose, encephalopathy, (CHRONULAC) 10 GM/15ML SOLN Take 30 mLs (20 g total) by mouth 2 (two) times daily. 946 mL 0   midodrine (PROAMATINE) 10 MG tablet Take 1 tablet (10 mg total) by mouth 3 (three) times daily with meals. 90 tablet 0   spironolactone (ALDACTONE) 25 MG tablet Take 2 tablets (50 mg total) by mouth daily. 120 tablet 3   pantoprazole (PROTONIX) 40 MG tablet Take 1 tablet (40 mg total) by mouth 2 (two) times daily. (Patient not taking: Reported on 05/14/2021) 60 tablet 1   No current facility-administered medications for this visit.   Allergies  Allergen Reactions   Latex Itching   Tape Itching   Amoxicillin Other (See  Comments)    Itching around eyes and scalp on amoxicilin   Cephalexin Nausea Only    Upset stomach Ceftriaxone is ok   Penicillins Rash     Review of Systems: All systems reviewed and negative except where noted in HPI.    DG Chest 2 View  Result Date: 04/17/2021 CLINICAL DATA:  Short of breath. EXAM: CHEST - 2 VIEW COMPARISON:  04/06/2021 FINDINGS: Heart size and vascularity normal. Mild bibasilar atelectasis or scarring. Negative for pneumonia or effusion. IMPRESSION: No active cardiopulmonary disease. Electronically Signed   By: Franchot Gallo M.D.   On: 04/17/2021 18:31   IR Paracentesis  Result Date: 05/02/2021 INDICATION: Patient with history of alcoholic cirrhosis with recurrent ascites. Request is for therapeutic and diagnostic paracentesis. Maximum 10 L EXAM: ULTRASOUND GUIDED DIAGNOSTIC AND THERAPEUTIC PARACENTESIS MEDICATIONS: Lidocaine 1% 10 mL COMPLICATIONS: None immediate. PROCEDURE: Informed written consent was obtained from the patient after a discussion of the risks, benefits  and alternatives to treatment. A timeout was performed prior to the initiation of the procedure. Initial ultrasound scanning demonstrates a large amount of ascites within the right lower abdominal quadrant. The right lower abdomen was prepped and draped in the usual sterile fashion. 1% lidocaine was used for local anesthesia. Following this, a 19 gauge, 7-cm, Yueh catheter was introduced. An ultrasound image was saved for documentation purposes. The paracentesis was performed. The catheter was removed and a dressing was applied. The patient tolerated the procedure well without immediate post procedural complication. Patient received post-procedure intravenous albumin; see nursing notes for details. FINDINGS: A total of approximately 7 L of straw-colored fluid was removed. Samples were sent to the laboratory as requested by the clinical team. IMPRESSION: Successful ultrasound-guided diagnostic and therapeutic  paracentesis yielding 7 liters of peritoneal fluid. Read by: Rushie Nyhan, NP Electronically Signed   By: Ruthann Cancer M.D.   On: 05/02/2021 16:18   IR Paracentesis  Result Date: 04/22/2021 INDICATION: Patient with a history of alcoholic cirrhosis with recurrent large volume ascites. Interventional radiology asked to perform a therapeutic and diagnostic paracentesis up to 10 L. EXAM: ULTRASOUND GUIDED PARACENTESIS MEDICATIONS: 1% lidocaine 10 mL COMPLICATIONS: None immediate. PROCEDURE: Informed written consent was obtained from the patient after a discussion of the risks, benefits and alternatives to treatment. A timeout was performed prior to the initiation of the procedure. Initial ultrasound scanning demonstrates a large amount of ascites within the right lower abdominal quadrant. The right lower abdomen was prepped and draped in the usual sterile fashion. 1% lidocaine was used for local anesthesia. Following this, a 19 gauge, 7-cm, Yueh catheter was introduced. An ultrasound image was saved for documentation purposes. The paracentesis was performed. The catheter was removed and a dressing was applied. The patient tolerated the procedure well without immediate post procedural complication. Patient received post-procedure intravenous albumin; see nursing notes for details. FINDINGS: A total of approximately 6.6 L of clear yellow fluid was removed. Samples were sent to the laboratory as requested by the clinical team. IMPRESSION: Successful ultrasound-guided paracentesis yielding 6.6 liters of peritoneal fluid. Read by: Soyla Dryer, NP Electronically Signed   By: Aletta Edouard M.D.   On: 04/22/2021 16:44    Physical Exam: BP 100/68    Pulse 97    Ht 6' (1.829 m)    Wt 206 lb 8 oz (93.7 kg)    SpO2 99%    BMI 28.01 kg/m  Constitutional: Pleasant,well-developed, Caucasian male in no acute distress. HEENT: Normocephalic and atraumatic. Conjunctivae are normal. Scleral icterus present Neck  supple.  Cardiovascular: Normal rate, regular rhythm.  Pulmonary/chest: Effort normal and breath sounds normal. No wheezing, rales or rhonchi. Abdominal: Soft, distended with ascites, but not tense, nontender. Bowel sounds active throughout. There are no masses palpable. No umbilical hernia Extremities: no edema Neurological: Alert and oriented to person place and time.   No asterixis Skin: Mildly jaundiced, Skin is warm and dry. No rashes noted. Psychiatric: Normal mood and affect. Behavior is normal.  CBC    Component Value Date/Time   WBC 5.9 04/17/2021 1708   RBC 2.21 (L) 04/17/2021 1708   HGB 7.3 (L) 04/17/2021 1708   HCT 21.7 (L) 04/17/2021 1708   PLT 82 (L) 04/17/2021 1708   MCV 98.2 04/17/2021 1708   MCH 33.0 04/17/2021 1708   MCHC 33.6 04/17/2021 1708   RDW 18.4 (H) 04/17/2021 1708   LYMPHSABS 1.3 04/17/2021 1708   MONOABS 0.8 04/17/2021 1708   EOSABS 0.2 04/17/2021 1708  BASOSABS 0.0 04/17/2021 1708    CMP     Component Value Date/Time   NA 133 (L) 04/17/2021 1708   K 3.7 04/17/2021 1708   CL 106 04/17/2021 1708   CO2 21 (L) 04/17/2021 1708   GLUCOSE 108 (H) 04/17/2021 1708   BUN 18 04/17/2021 1708   CREATININE 1.29 (H) 04/17/2021 1708   CREATININE 0.52 (L) 11/21/2020 1535   CALCIUM 8.7 (L) 04/17/2021 1708   PROT 6.6 04/17/2021 1708   ALBUMIN 3.3 (L) 04/17/2021 1708   AST 45 (H) 04/17/2021 1708   ALT 14 04/17/2021 1708   ALKPHOS 122 04/17/2021 1708   BILITOT 7.3 (H) 04/17/2021 1708   GFRNONAA >60 04/17/2021 1708   GFRAA  05/13/2010 1718    >60        The eGFR has been calculated using the MDRD equation. This calculation has not been validated in all clinical situations. eGFR's persistently <60 mL/min signify possible Chronic Kidney Disease.     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  He continues to have problems with large volume ascites and has required multiple therapeutic paracenteses.  His problems with the ascites are related to: renal insufficiency requiring judicious use of diuretics, patient confusion about medications (not taking aldactone), and likely lack of adherence to a strict low sodium diet.  Currently, he is not a TIPS candidate because of his hyperbilirubinemia.  He has significant ascites on exam today and is requesting another LVP.  Hopefully, he will regain some more renal function now that he has stopped taking Mobic and stopped drinking, and we will be able to escalate his diuretic regimen and improve his ascites control.  He is interested in a liver transplant. He needs a repeat EGD to assess healing of his gastric ulcer, and he is also overdue for his initial high risk screening colonoscopy (father, CRC).  He was noted to have GAVE on EGD as well.  We can consider ablating the GAVE at the time of his EGD if he continues to be significantly anemic. He has esophageal varices without a history of bleeding, currently not on NSBB due to renal insufficiency and low BP.  We will reassess his kidney function again and discuss beta blockers again vs empiric EVL based on the appearance of his varices at that time.  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, GAVE, PHG, esophageal varices w/o bleeding  - Referral to IR for repeat LVP  - Continue lasix 40 mg PO qam for now; pt will also resume aldactone 50 mg qpm  - Recheck BMP; based on results, will increase diuretic regimen  - Low sodium diet (<2gm Na); offered nutrition referral, pt declined; was given handouts with further information  - Recheck INR, HFP  History of UGIB secondary to PUD (NSAID-induced) - EGD in hospital to assess ulcer healing - Renew Protonix 40 mg; resume BID until repeat EGD - Continue to avoid NSAIDs completely  GAVE, anemia -  EGD as above, possible APC  CRC screening - Colonoscopy  Follow up in clinic in 2 months; if still abstinent, will refer to transplant facility  The details, risks (including bleeding, perforation, infection, missed lesions, medication reactions and possible hospitalization or surgery if complications occur), benefits, and alternatives to EGD/colonoscopy with possible biopsy and possible polypectomy were discussed with the patient and he  consents to proceed.   Tremaine Earwood E. Candis Schatz, MD [facility]  No ref. provider found

## 2021-05-16 ENCOUNTER — Other Ambulatory Visit: Payer: Self-pay

## 2021-05-16 ENCOUNTER — Ambulatory Visit (HOSPITAL_COMMUNITY): Payer: MEDICAID

## 2021-05-16 ENCOUNTER — Ambulatory Visit (HOSPITAL_COMMUNITY)
Admission: RE | Admit: 2021-05-16 | Discharge: 2021-05-16 | Disposition: A | Discharge status: Home / Self Care | Payer: Medicaid Other | Source: Ambulatory Visit | Attending: Gastroenterology | Admitting: Gastroenterology

## 2021-05-16 ENCOUNTER — Encounter: Payer: Self-pay | Admitting: Gastroenterology

## 2021-05-16 DIAGNOSIS — R188 Other ascites: Secondary | ICD-10-CM | POA: Insufficient documentation

## 2021-05-16 DIAGNOSIS — K259 Gastric ulcer, unspecified as acute or chronic, without hemorrhage or perforation: Secondary | ICD-10-CM

## 2021-05-16 DIAGNOSIS — K7031 Alcoholic cirrhosis of liver with ascites: Secondary | ICD-10-CM

## 2021-05-16 DIAGNOSIS — Z1212 Encounter for screening for malignant neoplasm of rectum: Secondary | ICD-10-CM

## 2021-05-16 DIAGNOSIS — Z1211 Encounter for screening for malignant neoplasm of colon: Secondary | ICD-10-CM

## 2021-05-16 HISTORY — PX: IR PARACENTESIS: IMG2679

## 2021-05-16 LAB — BODY FLUID CELL COUNT WITH DIFFERENTIAL
Eos, Fluid: 0 %
Lymphs, Fluid: 27 %
Monocyte-Macrophage-Serous Fluid: 71 % (ref 50–90)
Neutrophil Count, Fluid: 2 % (ref 0–25)
Total Nucleated Cell Count, Fluid: 63 cu mm (ref 0–1000)

## 2021-05-16 LAB — GRAM STAIN

## 2021-05-16 MED ORDER — ALBUMIN HUMAN 25 % IV SOLN
INTRAVENOUS | Status: AC
  Filled 2021-05-16: qty 200

## 2021-05-16 MED ORDER — LIDOCAINE HCL (PF) 1 % IJ SOLN
INTRAMUSCULAR | Status: DC | PRN
  Administered 2021-05-16: 10 mL

## 2021-05-16 MED ORDER — ALBUMIN HUMAN 25 % IV SOLN
50.0000 g | Freq: Once | INTRAVENOUS | Status: AC
Start: 2021-05-16 — End: 2021-05-16
  Administered 2021-05-16: 50 g via INTRAVENOUS

## 2021-05-16 MED ORDER — LIDOCAINE HCL 1 % IJ SOLN
INTRAMUSCULAR | Status: AC
  Filled 2021-05-16: qty 20

## 2021-05-16 NOTE — Procedures (Signed)
PROCEDURE SUMMARY:  Successful US guided diagnostic and therapeutic paracentesis from RLQ.  Yielded 6.5 L of clear, yellow fluid.  No immediate complications.  Pt tolerated well.   Specimen was sent for labs.  EBL < 1 mL  Shon Hough, AGNP 05/16/2021 2:15 PM

## 2021-05-20 ENCOUNTER — Telehealth: Payer: Self-pay | Admitting: Gastroenterology

## 2021-05-20 NOTE — Telephone Encounter (Signed)
Patient called and stated the Protonix that was prescribed for him was too expensive -- about $200 a bottle -- and wanted to know if there was something you could substitute for it that would be more affordable.  Please call patient and advise.  Thank you.

## 2021-05-21 LAB — PATHOLOGIST SMEAR REVIEW: Path Review: INCREASED

## 2021-05-21 NOTE — Telephone Encounter (Signed)
Called patient and let him know he could take Prilosec OTC.

## 2021-05-22 NOTE — Progress Notes (Signed)
Kyle Pitts,  Please let Kyle Pitts know his labs were stable to improving.  His kidney function and electrolytes were okay (his sodium level is low).  He should be taking lasix 40 mg qam and aldactone 50 mg qpm.  I would like to check a BMP in 2 weeks, and if still stable, increase his diuretics again so that he doesn't reaccumulate ascites so quickly.  Please tell him that although his serum sodium is low, this WILL NOT improve by eating more salt.  His low sodium is related to the ascites and the lasix.  He should continue to follow a low sodium diet (<2 grams per day).

## 2021-05-23 ENCOUNTER — Other Ambulatory Visit: Payer: Self-pay

## 2021-05-23 DIAGNOSIS — K7031 Alcoholic cirrhosis of liver with ascites: Secondary | ICD-10-CM

## 2021-05-23 MED ORDER — LACTULOSE ENCEPHALOPATHY 10 GM/15ML PO SOLN: 20.0000 g | Freq: Two times a day (BID) | ORAL | 3 refills | Status: DC

## 2021-05-26 ENCOUNTER — Other Ambulatory Visit: Payer: Self-pay

## 2021-05-26 ENCOUNTER — Telehealth: Payer: Self-pay | Admitting: Gastroenterology

## 2021-05-26 DIAGNOSIS — K7031 Alcoholic cirrhosis of liver with ascites: Secondary | ICD-10-CM

## 2021-05-26 NOTE — Telephone Encounter (Signed)
Patient called to schedule a paracentesis appt. for a Thursday if possible.

## 2021-05-26 NOTE — Telephone Encounter (Signed)
Pt states he has fluid building up in his abdomen again. Asked if he has had any wt gain, he reports he doesn't have any scales but his clothes are fitting tighter and he get a little SOB at times. Requesting to have para done on Thursday. Please advise.

## 2021-05-26 NOTE — Telephone Encounter (Signed)
Jenel Lucks, MD  You 21 minutes ago (3:47 PM)   Julian Ok to place referral to IR for repeat therapeutic paracentesis up to 10 L with albumin infusion if needed.   Please obtain cell count, gram stain/culture on fluid specimen.  Following the paracentesis, he should increase his aldactone to 100 mg PO qhs.     Pt scheduled for IR para per orders above at [facility] 05/29/21 at 10am, pt to arrive there at 9:30am. Pt aware and knows to increase aldactone to 100mg  after the paracentesis.

## 2021-05-29 ENCOUNTER — Ambulatory Visit (HOSPITAL_COMMUNITY)
Admission: RE | Admit: 2021-05-29 | Discharge: 2021-05-29 | Disposition: A | Discharge status: Home / Self Care | Payer: Medicaid Other | Source: Ambulatory Visit | Attending: Gastroenterology | Admitting: Gastroenterology

## 2021-05-29 ENCOUNTER — Other Ambulatory Visit: Payer: Self-pay

## 2021-05-29 DIAGNOSIS — R188 Other ascites: Secondary | ICD-10-CM | POA: Diagnosis not present

## 2021-05-29 DIAGNOSIS — K7031 Alcoholic cirrhosis of liver with ascites: Secondary | ICD-10-CM

## 2021-05-29 HISTORY — PX: IR PARACENTESIS: IMG2679

## 2021-05-29 LAB — GRAM STAIN

## 2021-05-29 LAB — BODY FLUID CELL COUNT WITH DIFFERENTIAL
Lymphs, Fluid: 35 %
Monocyte-Macrophage-Serous Fluid: 64 % (ref 50–90)
Neutrophil Count, Fluid: 1 % (ref 0–25)
Total Nucleated Cell Count, Fluid: 68 cu mm (ref 0–1000)

## 2021-05-29 MED ORDER — LIDOCAINE HCL 1 % IJ SOLN
INTRAMUSCULAR | Status: AC
  Filled 2021-05-29: qty 20

## 2021-05-29 MED ORDER — ALBUMIN HUMAN 25 % IV SOLN
INTRAVENOUS | Status: AC
  Filled 2021-05-29: qty 200

## 2021-05-29 MED ORDER — ALBUMIN HUMAN 25 % IV SOLN
50.0000 g | Freq: Once | INTRAVENOUS | Status: AC
  Administered 2021-05-29: 50 g via INTRAVENOUS

## 2021-05-29 NOTE — Procedures (Signed)
PROCEDURE SUMMARY:  Successful image-guided paracentesis from the left lower abdomen.  Yielded 7.3 liters of clear yellow fluid.  No immediate complications.  EBL < 1 mL Patient tolerated well.   Specimen was sent for labs.  Please see imaging section of Epic for full dictation.  Joaquim Nam PA-C 05/29/2021 10:17 AM

## 2021-05-31 LAB — PATHOLOGIST SMEAR REVIEW

## 2021-06-03 LAB — CULTURE, BODY FLUID W GRAM STAIN -BOTTLE: Culture: NO GROWTH

## 2021-06-06 ENCOUNTER — Telehealth: Payer: Self-pay

## 2021-06-06 NOTE — Telephone Encounter (Signed)
-----   Message from Orion Modest, RN sent at 05/23/2021 10:19 AM EST ----- Regarding: labs Pt needs BMP. Order in epic.

## 2021-06-06 NOTE — Telephone Encounter (Signed)
Spoke with pt and reminded him that he needs to come in for labs, he verbalized understanding.

## 2021-06-10 ENCOUNTER — Telehealth: Payer: Self-pay | Admitting: Gastroenterology

## 2021-06-10 ENCOUNTER — Other Ambulatory Visit: Payer: Self-pay

## 2021-06-10 DIAGNOSIS — K7031 Alcoholic cirrhosis of liver with ascites: Secondary | ICD-10-CM

## 2021-06-10 NOTE — Telephone Encounter (Signed)
Pt scheduled for IR para at [facility] per Dr. Milas Hock orders for 06/12/21 at 1pm, pt needs to arrive there at 12:30pm. Left message for pt to call back.  Spoke with pt and he is aware of appt. Order in for lab. Pt states he will try to come Thursday for the lab.

## 2021-06-10 NOTE — Telephone Encounter (Signed)
Patient returned your call, please advise. 

## 2021-06-10 NOTE — Telephone Encounter (Signed)
Pt calling requesting a paracentesis this week. Asked pt if he has had any wt gain and he states "my belly is blown up like a tick." Pt also reports he is having some SOB also. Pt would like to have para done Thurs or Friday after noon. Please advise.

## 2021-06-10 NOTE — Telephone Encounter (Signed)
Patient called to schedule paracentesis

## 2021-06-12 ENCOUNTER — Ambulatory Visit (HOSPITAL_COMMUNITY): Payer: Medicaid Other

## 2021-06-13 ENCOUNTER — Ambulatory Visit (HOSPITAL_COMMUNITY)
Admission: RE | Admit: 2021-06-13 | Discharge: 2021-06-13 | Disposition: A | Discharge status: Home / Self Care | Payer: Medicaid Other | Source: Ambulatory Visit | Attending: Gastroenterology | Admitting: Gastroenterology

## 2021-06-13 ENCOUNTER — Other Ambulatory Visit: Payer: Self-pay

## 2021-06-13 DIAGNOSIS — K7031 Alcoholic cirrhosis of liver with ascites: Secondary | ICD-10-CM | POA: Diagnosis present

## 2021-06-13 HISTORY — PX: IR PARACENTESIS: IMG2679

## 2021-06-13 LAB — BODY FLUID CELL COUNT WITH DIFFERENTIAL
Eos, Fluid: 0 %
Lymphs, Fluid: 23 %
Monocyte-Macrophage-Serous Fluid: 72 % (ref 50–90)
Neutrophil Count, Fluid: 5 % (ref 0–25)
Total Nucleated Cell Count, Fluid: 111 cu mm (ref 0–1000)

## 2021-06-13 LAB — CULTURE, BODY FLUID W GRAM STAIN -BOTTLE

## 2021-06-13 MED ORDER — LIDOCAINE HCL 1 % IJ SOLN
INTRAMUSCULAR | Status: AC
  Filled 2021-06-13: qty 20

## 2021-06-13 MED ORDER — ALBUMIN HUMAN 25 % IV SOLN
INTRAVENOUS | Status: AC
  Filled 2021-06-13: qty 200

## 2021-06-13 MED ORDER — ALBUMIN HUMAN 25 % IV SOLN
50.0000 g | Freq: Once | INTRAVENOUS | Status: AC
  Administered 2021-06-13: 50 g via INTRAVENOUS
  Filled 2021-06-13: qty 200

## 2021-06-13 MED ORDER — LIDOCAINE HCL (PF) 1 % IJ SOLN
INTRAMUSCULAR | Status: DC | PRN
  Administered 2021-06-13: 10 mL

## 2021-06-13 NOTE — Procedures (Signed)
PROCEDURE SUMMARY:  Successful image-guided paracentesis from the right lower abdomen.  Yielded 8.0 liters of clear yellow fluid.  No immediate complications.  EBL < 1 mL Patient tolerated well.   Specimen was sent for labs.  Please see imaging section of Epic for full dictation.  Villa Herb PA-C 06/13/2021 2:34 PM

## 2021-06-14 LAB — GRAM STAIN

## 2021-06-16 ENCOUNTER — Ambulatory Visit: Payer: Self-pay | Admitting: Internal Medicine

## 2021-06-17 LAB — PATHOLOGIST SMEAR REVIEW

## 2021-06-18 LAB — CULTURE, BODY FLUID W GRAM STAIN -BOTTLE: Culture: NO GROWTH

## 2021-06-19 ENCOUNTER — Telehealth: Payer: Self-pay | Admitting: Gastroenterology

## 2021-06-19 NOTE — Telephone Encounter (Signed)
Patient called requesting refill on Lactulose ?

## 2021-06-20 ENCOUNTER — Other Ambulatory Visit: Payer: Self-pay

## 2021-06-20 MED ORDER — LACTULOSE ENCEPHALOPATHY 10 GM/15ML PO SOLN: 20.0000 g | Freq: Two times a day (BID) | ORAL | 3 refills | Status: DC

## 2021-06-20 NOTE — Telephone Encounter (Signed)
A refill of lactulose has been sent to patients pharmacy. ?

## 2021-06-23 ENCOUNTER — Telehealth: Payer: Self-pay

## 2021-06-23 ENCOUNTER — Telehealth: Payer: Self-pay | Admitting: Gastroenterology

## 2021-06-23 ENCOUNTER — Other Ambulatory Visit: Payer: Self-pay

## 2021-06-23 DIAGNOSIS — K7031 Alcoholic cirrhosis of liver with ascites: Secondary | ICD-10-CM

## 2021-06-23 NOTE — Telephone Encounter (Signed)
Lactulose sent and received by pharmacy 06/20/2021 ?

## 2021-06-23 NOTE — Telephone Encounter (Signed)
Pt states he is getting more fluid build up in his abdomen and is wanting to have another paracentesis done. States he is going to try to get here to the lab tomorrow to have the BMET drawn. Please advise regarding paracentesis. ?

## 2021-06-23 NOTE — Telephone Encounter (Signed)
Pt aware, states he will come for labs tomorrow. IR para scheduled per orders below at [facility] 06/25/21 at 9am, pt to arrive there at 8:30am. Pt aware of appt. ?

## 2021-06-23 NOTE — Telephone Encounter (Signed)
Inbound call from patient requesting medication refill for lactulose ?

## 2021-06-24 ENCOUNTER — Telehealth: Payer: Self-pay | Admitting: Gastroenterology

## 2021-06-24 ENCOUNTER — Other Ambulatory Visit (INDEPENDENT_AMBULATORY_CARE_PROVIDER_SITE_OTHER): Payer: Medicaid Other

## 2021-06-24 DIAGNOSIS — K7031 Alcoholic cirrhosis of liver with ascites: Secondary | ICD-10-CM

## 2021-06-24 LAB — BASIC METABOLIC PANEL
BUN: 19 mg/dL (ref 6–23)
CO2: 25 mEq/L (ref 19–32)
Calcium: 8.8 mg/dL (ref 8.4–10.5)
Chloride: 100 mEq/L (ref 96–112)
Creatinine, Ser: 1.43 mg/dL (ref 0.40–1.50)
GFR: 53.73 mL/min — ABNORMAL LOW (ref >60.00)
Glucose, Bld: 112 mg/dL — ABNORMAL HIGH (ref 70–99)
Potassium: 3.6 mEq/L (ref 3.5–5.1)
Sodium: 130 mEq/L — ABNORMAL LOW (ref 135–145)

## 2021-06-24 NOTE — Progress Notes (Signed)
Kyle Pitts,  ?Please have Mr. Fleissner increase his aldactone to 50 mg PO twice a day.  Plan to repeat BMP in 2 weeks ?Thank you

## 2021-06-24 NOTE — Telephone Encounter (Signed)
Patient requesting refill for Promatine. Please advise.  ?

## 2021-06-25 ENCOUNTER — Ambulatory Visit (HOSPITAL_COMMUNITY)
Admission: RE | Admit: 2021-06-25 | Discharge: 2021-06-25 | Disposition: A | Discharge status: Home / Self Care | Payer: Medicaid Other | Source: Ambulatory Visit | Attending: Gastroenterology | Admitting: Gastroenterology

## 2021-06-25 ENCOUNTER — Other Ambulatory Visit: Payer: Self-pay

## 2021-06-25 DIAGNOSIS — K7031 Alcoholic cirrhosis of liver with ascites: Secondary | ICD-10-CM | POA: Insufficient documentation

## 2021-06-25 HISTORY — PX: IR PARACENTESIS: IMG2679

## 2021-06-25 LAB — BODY FLUID CELL COUNT WITH DIFFERENTIAL
Eos, Fluid: 0 %
Lymphs, Fluid: 60 %
Monocyte-Macrophage-Serous Fluid: 38 % — ABNORMAL LOW (ref 50–90)
Neutrophil Count, Fluid: 2 % (ref 0–25)
Total Nucleated Cell Count, Fluid: 61 cu mm (ref 0–1000)

## 2021-06-25 LAB — GRAM STAIN

## 2021-06-25 MED ORDER — ALBUMIN HUMAN 25 % IV SOLN
INTRAVENOUS | Status: AC
  Filled 2021-06-25: qty 200

## 2021-06-25 MED ORDER — MIDODRINE HCL 10 MG PO TABS: 10.0000 mg | ORAL_TABLET | Freq: Three times a day (TID) | ORAL | 3 refills | Status: DC

## 2021-06-25 MED ORDER — ALBUMIN HUMAN 25 % IV SOLN
50.0000 g | Freq: Once | INTRAVENOUS | Status: AC
  Administered 2021-06-25: 50 g via INTRAVENOUS
  Filled 2021-06-25: qty 200

## 2021-06-25 MED ORDER — LIDOCAINE HCL 1 % IJ SOLN
INTRAMUSCULAR | Status: DC | PRN
  Administered 2021-06-25: 10 mL

## 2021-06-25 MED ORDER — SPIRONOLACTONE 25 MG PO TABS: 50.0000 mg | ORAL_TABLET | Freq: Two times a day (BID) | ORAL | 3 refills | Status: DC

## 2021-06-25 MED ORDER — LIDOCAINE HCL 1 % IJ SOLN
INTRAMUSCULAR | Status: AC
  Filled 2021-06-25: qty 20

## 2021-06-25 NOTE — Telephone Encounter (Signed)
Called patient and let him know to continue taking Proamatine and that a refill will be sent to him pharmacy.  ?

## 2021-06-25 NOTE — Procedures (Signed)
PROCEDURE SUMMARY: ? ?Successful US guided diagnostic and therapeutic paracentesis from RLQ.  ?Yielded 7 L of clear, yellow fluid.  ?No immediate complications.  ?Pt tolerated well.  ? ?Specimen was sent for labs. ? ?EBL < 1 mL ? ?Tyson Alias, AGNP ?06/25/2021 ?9:50 AM ?  ?

## 2021-06-27 LAB — PATHOLOGIST SMEAR REVIEW

## 2021-06-30 LAB — CULTURE, BODY FLUID W GRAM STAIN -BOTTLE: Culture: NO GROWTH

## 2021-07-02 ENCOUNTER — Other Ambulatory Visit: Payer: Self-pay

## 2021-07-02 ENCOUNTER — Telehealth: Payer: Self-pay | Admitting: Gastroenterology

## 2021-07-02 DIAGNOSIS — K7031 Alcoholic cirrhosis of liver with ascites: Secondary | ICD-10-CM

## 2021-07-02 NOTE — Telephone Encounter (Signed)
Patient called and wanted to know if he could have a paracentesis set up for this Friday around noon.  He is asking for that time frame so that a hospice nurse can come sit with his sister while he has it done and they have told him noon-ish is a good time for them.  Please call patient and advise. ?

## 2021-07-02 NOTE — Telephone Encounter (Signed)
[facility] pt calling requesting a paracentesis. Please advise as DOD. Previous orders below: ? ?Please place order for IR LVP, max 10 L with IV albumin, and send fluid for cell count/Gram stain/culture   ?

## 2021-07-02 NOTE — Telephone Encounter (Signed)
IR para scheduled at [facility] per orders below for 07/04/21 at 1pm, pt to arrive there at 12:30pm. Pt aware of appt. ?

## 2021-07-04 ENCOUNTER — Other Ambulatory Visit: Payer: Self-pay

## 2021-07-04 ENCOUNTER — Ambulatory Visit (HOSPITAL_COMMUNITY)
Admission: RE | Admit: 2021-07-04 | Discharge: 2021-07-04 | Disposition: A | Discharge status: Home / Self Care | Payer: Medicaid Other | Source: Ambulatory Visit | Attending: Gastroenterology | Admitting: Gastroenterology

## 2021-07-04 DIAGNOSIS — K7031 Alcoholic cirrhosis of liver with ascites: Secondary | ICD-10-CM | POA: Diagnosis present

## 2021-07-04 HISTORY — PX: IR PARACENTESIS: IMG2679

## 2021-07-04 LAB — BODY FLUID CELL COUNT WITH DIFFERENTIAL
Eos, Fluid: 0 %
Lymphs, Fluid: 27 %
Monocyte-Macrophage-Serous Fluid: 71 % (ref 50–90)
Neutrophil Count, Fluid: 2 % (ref 0–25)
Total Nucleated Cell Count, Fluid: 130 cu mm (ref 0–1000)

## 2021-07-04 LAB — GRAM STAIN

## 2021-07-04 MED ORDER — ALBUMIN HUMAN 25 % IV SOLN: 50.0000 g | Freq: Once | INTRAVENOUS | Status: DC

## 2021-07-04 MED ORDER — LIDOCAINE HCL (PF) 1 % IJ SOLN
INTRAMUSCULAR | Status: DC | PRN
  Administered 2021-07-04: 10 mL

## 2021-07-04 MED ORDER — ALBUMIN HUMAN 25 % IV SOLN
INTRAVENOUS | Status: AC
  Filled 2021-07-04: qty 200

## 2021-07-04 MED ORDER — LIDOCAINE HCL 1 % IJ SOLN
INTRAMUSCULAR | Status: AC
  Filled 2021-07-04: qty 20

## 2021-07-04 NOTE — Procedures (Signed)
PROCEDURE SUMMARY: ? ?Successful US guided diagnostic and therapeutic paracentesis from RLQ. ?Yielded 6.7 L of clear, yellow fluid.  ?No immediate complications.  ?Pt tolerated well.  ? ?Specimen was sent for labs. ? ?EBL < 1 mL ? ?Tyson Alias, AGNP ?07/04/2021 ?1:35 PM ? ? ?

## 2021-07-07 LAB — PATHOLOGIST SMEAR REVIEW

## 2021-07-09 LAB — CULTURE, BODY FLUID W GRAM STAIN -BOTTLE: Culture: NO GROWTH

## 2021-07-10 ENCOUNTER — Other Ambulatory Visit (INDEPENDENT_AMBULATORY_CARE_PROVIDER_SITE_OTHER): Payer: Medicaid Other

## 2021-07-10 DIAGNOSIS — K7031 Alcoholic cirrhosis of liver with ascites: Secondary | ICD-10-CM

## 2021-07-10 LAB — BASIC METABOLIC PANEL WITH GFR
BUN: 20 mg/dL (ref 6–23)
CO2: 23 meq/L (ref 19–32)
Calcium: 8.2 mg/dL — ABNORMAL LOW (ref 8.4–10.5)
Chloride: 97 meq/L (ref 96–112)
Creatinine, Ser: 2.02 mg/dL — ABNORMAL HIGH (ref 0.40–1.50)
GFR: 35.49 mL/min — ABNORMAL LOW
Glucose, Bld: 115 mg/dL — ABNORMAL HIGH (ref 70–99)
Potassium: 3.6 meq/L (ref 3.5–5.1)
Sodium: 126 meq/L — ABNORMAL LOW (ref 135–145)

## 2021-07-11 ENCOUNTER — Telehealth: Payer: Self-pay | Admitting: Gastroenterology

## 2021-07-11 DIAGNOSIS — K7031 Alcoholic cirrhosis of liver with ascites: Secondary | ICD-10-CM

## 2021-07-11 NOTE — Telephone Encounter (Signed)
Dr Tomasa Rand please advise. The pt is requesting a paracentesis.  Last one was on 07/04/21.   ?

## 2021-07-11 NOTE — Telephone Encounter (Signed)
Patient called requesting a paracentesis be set up for him either next Wednesday or Thursday after noon as that is when hospice can take care his sister. ?

## 2021-07-14 ENCOUNTER — Other Ambulatory Visit: Payer: Self-pay

## 2021-07-14 DIAGNOSIS — K7031 Alcoholic cirrhosis of liver with ascites: Secondary | ICD-10-CM

## 2021-07-14 NOTE — Telephone Encounter (Signed)
Left message on machine to call back  

## 2021-07-14 NOTE — Telephone Encounter (Addendum)
I have entered the labs to be completed in 1 week.  I have entered the order for paracentesis.  Cell count, gram stain,and culture entered.   ? ?Paracentesis has been scheduled for 3/29 at 10 am with 930 am arrival time at [facility].  ? ?Sent a message to Dr C to confirm amount of albumin to give (? 25 g) ? ?Also need to confirm what he taking for his diuretics at this point?  I'm wondering if he increased his lasix instead of aldactone 2 weeks ago  ?

## 2021-07-14 NOTE — Telephone Encounter (Signed)
Patient returned your call, please advise. 

## 2021-07-15 ENCOUNTER — Ambulatory Visit: Payer: Self-pay | Admitting: Gastroenterology

## 2021-07-15 ENCOUNTER — Other Ambulatory Visit: Payer: Self-pay

## 2021-07-15 DIAGNOSIS — K7031 Alcoholic cirrhosis of liver with ascites: Secondary | ICD-10-CM

## 2021-07-15 NOTE — Telephone Encounter (Signed)
The pt is aware of the appt for paracentesis for tomorrow at 10 am at [facility] ?He is also aware to have repeat labs in 1 week.  Order entered.  ?He did confirm that he is taking lasix 40 mg daily and aldactone 50 mg twice daily. ?

## 2021-07-16 ENCOUNTER — Ambulatory Visit (HOSPITAL_COMMUNITY)
Admission: RE | Admit: 2021-07-16 | Discharge: 2021-07-16 | Disposition: A | Discharge status: Home / Self Care | Payer: Medicaid Other | Source: Ambulatory Visit | Attending: Gastroenterology | Admitting: Gastroenterology

## 2021-07-16 ENCOUNTER — Other Ambulatory Visit: Payer: Self-pay

## 2021-07-16 DIAGNOSIS — K7031 Alcoholic cirrhosis of liver with ascites: Secondary | ICD-10-CM | POA: Diagnosis not present

## 2021-07-16 HISTORY — PX: IR PARACENTESIS: IMG2679

## 2021-07-16 LAB — GRAM STAIN

## 2021-07-16 LAB — BODY FLUID CELL COUNT WITH DIFFERENTIAL
Eos, Fluid: 0 %
Lymphs, Fluid: 52 %
Monocyte-Macrophage-Serous Fluid: 47 % — ABNORMAL LOW (ref 50–90)
Neutrophil Count, Fluid: 1 % (ref 0–25)
Total Nucleated Cell Count, Fluid: 99 cu mm (ref 0–1000)

## 2021-07-16 MED ORDER — LIDOCAINE HCL 1 % IJ SOLN
INTRAMUSCULAR | Status: AC
  Filled 2021-07-16: qty 20

## 2021-07-16 MED ORDER — LIDOCAINE HCL (PF) 1 % IJ SOLN
INTRAMUSCULAR | Status: DC | PRN
  Administered 2021-07-16: 10 mL

## 2021-07-16 MED ORDER — ALBUMIN HUMAN 25 % IV SOLN
INTRAVENOUS | Status: AC
Start: 2021-07-16 — End: 2021-07-16
  Filled 2021-07-16: qty 200

## 2021-07-16 MED ORDER — ALBUMIN HUMAN 25 % IV SOLN
50.0000 g | Freq: Once | INTRAVENOUS | Status: AC
  Administered 2021-07-16: 50 g via INTRAVENOUS

## 2021-07-16 NOTE — Procedures (Signed)
PROCEDURE SUMMARY: ? ?Successful US guided paracentesis from right lateral abdomen.  ?Yielded 8.3 liters of yellow, clear fluid.  ?No immediate complications.  ?Pt tolerated well.  ? ?Specimen was sent for labs. ? ?EBL < 70mL ? ?Hoyt Koch PA-C ?07/16/2021 ?2:44 PM ? ? ? ?

## 2021-07-17 LAB — PATHOLOGIST SMEAR REVIEW: Path Review: INCREASED

## 2021-07-21 ENCOUNTER — Ambulatory Visit: Payer: Self-pay | Admitting: Gastroenterology

## 2021-07-21 LAB — CULTURE, BODY FLUID W GRAM STAIN -BOTTLE: Culture: NO GROWTH

## 2021-07-22 ENCOUNTER — Other Ambulatory Visit: Payer: Self-pay

## 2021-07-22 ENCOUNTER — Telehealth: Payer: Self-pay | Admitting: Gastroenterology

## 2021-07-22 DIAGNOSIS — K7031 Alcoholic cirrhosis of liver with ascites: Secondary | ICD-10-CM

## 2021-07-22 NOTE — Telephone Encounter (Signed)
Pt scheduled for IR Para at [facility] 4/6 at 1pm, pt to arrive there at 12:30pm. Pt aware that he needs to come to have the labs drawn at our office this week. ?

## 2021-07-22 NOTE — Telephone Encounter (Signed)
Patient called stating he needed a paracentesis; however, the last time he spoke with someone about it, they told him he could make the appointment himself.  He said he would need the phone number.  If you could please call and provide the number for him.  Thank you. ?

## 2021-07-22 NOTE — Telephone Encounter (Signed)
Pt calling requesting to have a paracentesis. Please advise. ?

## 2021-07-24 ENCOUNTER — Other Ambulatory Visit (HOSPITAL_COMMUNITY): Payer: Medicaid Other

## 2021-07-24 ENCOUNTER — Other Ambulatory Visit (INDEPENDENT_AMBULATORY_CARE_PROVIDER_SITE_OTHER): Payer: Medicaid Other

## 2021-07-24 DIAGNOSIS — K7031 Alcoholic cirrhosis of liver with ascites: Secondary | ICD-10-CM | POA: Diagnosis not present

## 2021-07-24 LAB — BASIC METABOLIC PANEL
BUN: 24 mg/dL — ABNORMAL HIGH (ref 6–23)
CO2: 23 mEq/L (ref 19–32)
Calcium: 8.7 mg/dL (ref 8.4–10.5)
Chloride: 100 mEq/L (ref 96–112)
Creatinine, Ser: 1.62 mg/dL — ABNORMAL HIGH (ref 0.40–1.50)
GFR: 46.23 mL/min — ABNORMAL LOW (ref >60.00)
Glucose, Bld: 106 mg/dL — ABNORMAL HIGH (ref 70–99)
Potassium: 3.8 mEq/L (ref 3.5–5.1)
Sodium: 130 mEq/L — ABNORMAL LOW (ref 135–145)

## 2021-07-24 LAB — HEPATIC FUNCTION PANEL
ALT: 14 U/L (ref 0–53)
AST: 34 U/L (ref 0–37)
Albumin: 2.8 g/dL — ABNORMAL LOW (ref 3.5–5.2)
Alkaline Phosphatase: 123 U/L — ABNORMAL HIGH (ref 39–117)
Bilirubin, Direct: 0.7 mg/dL — ABNORMAL HIGH (ref 0.0–0.3)
Total Bilirubin: 1.9 mg/dL — ABNORMAL HIGH (ref 0.2–1.2)
Total Protein: 6.5 g/dL (ref 6.0–8.3)

## 2021-07-24 LAB — PROTIME-INR
INR: 1.3 ratio — ABNORMAL HIGH (ref 0.8–1.0)
Prothrombin Time: 14.2 s — ABNORMAL HIGH (ref 9.6–13.1)

## 2021-07-25 ENCOUNTER — Ambulatory Visit (HOSPITAL_COMMUNITY)
Admission: RE | Admit: 2021-07-25 | Discharge: 2021-07-25 | Disposition: A | Discharge status: Home / Self Care | Payer: Medicaid Other | Source: Ambulatory Visit | Attending: Gastroenterology | Admitting: Gastroenterology

## 2021-07-25 DIAGNOSIS — K7031 Alcoholic cirrhosis of liver with ascites: Secondary | ICD-10-CM | POA: Insufficient documentation

## 2021-07-25 HISTORY — PX: IR PARACENTESIS: IMG2679

## 2021-07-25 LAB — BODY FLUID CELL COUNT WITH DIFFERENTIAL
Eos, Fluid: 0 %
Lymphs, Fluid: 24 %
Monocyte-Macrophage-Serous Fluid: 70 % (ref 50–90)
Neutrophil Count, Fluid: 6 % (ref 0–25)
Total Nucleated Cell Count, Fluid: 104 cu mm (ref 0–1000)

## 2021-07-25 LAB — GRAM STAIN

## 2021-07-25 MED ORDER — ALBUMIN HUMAN 25 % IV SOLN
50.0000 g | Freq: Once | INTRAVENOUS | Status: AC
  Administered 2021-07-25: 50 g via INTRAVENOUS
  Filled 2021-07-25: qty 200

## 2021-07-25 MED ORDER — LIDOCAINE HCL 1 % IJ SOLN
INTRAMUSCULAR | Status: AC
  Filled 2021-07-25: qty 20

## 2021-07-25 MED ORDER — ALBUMIN HUMAN 25 % IV SOLN
INTRAVENOUS | Status: AC
  Filled 2021-07-25: qty 200

## 2021-07-25 MED ORDER — LIDOCAINE HCL (PF) 1 % IJ SOLN
INTRAMUSCULAR | Status: DC | PRN
Start: 2021-07-25 — End: 2021-07-26
  Administered 2021-07-25: 10 mL

## 2021-07-25 NOTE — Progress Notes (Signed)
? ?  Portal Hypertension Clinic Screening Evaluation ? ? ?Indication for evaluation: ?Kyle Pitts is a 61 y.o. male undergoing preliminary evaluation in the [facility]pert[facility] due to recurrent ascites. ? ?Referring Physician/Established Gastroenterologist:  Dustin Flock, [facility] ? ?Etiology of cirrhosis: EtOH ?Initially diagnosed: October 2022 ?# of paracentesis in last month: 4 ?# of paracentesis in last 2 months: 6 ?History of hepatic hydrothorax:  No ?History of hepatic encephalopathy: Yes, mild, controlled medically ? ?Prior evaluation for liver transplant: No ?History of hepatocellular carcinoma: No ? ?Prior esophagogastroduodenoscopy/intervention: Yes - 03/15/21 (Grade II esophageal varices, portal gastropathy, oozing gastric ulcers clipped, GAVE) ?Current esophageal varices: yes ?Current gastric varices: No ?History of hematemesis: No ? ?Current diuretic regimen: Furosemide 40 mg QD, Spironolactone 50 mg QD ?Current pharmacologic encephalopathy prophylaxis/treatment: lactulose 20 g BID ? ?History of renal dysfunction: Yes ?History of hemodialysis: No ? ?History of cardiac dysfunction: No ? ?Other pertinent past medical history: None ? ? ?Imaging: ?Prior cross sectional imaging of portal system: ?Non-con CT AP 03/15/21 ? ?Cirrhosis, ascites, normal caliber main and central portal veins. ? ?Echocardiogram: None available ? ? ?Labs: ?07/24/21 ?Creatinine: 1.62  ?Total Bilirubin: 1.9 ?INR: 1.3 ?Sodium: 130 ?Albumin: 2.8 ? ?Child-Pugh = B points, class 8 ?MELD = 22 (19.6% estimated 3 month mortality) ?[place] Index of Post-TIPS Survival (FIPS) = 1.28 (Overall survival at 1 month 84.8%, at 3 months 59.6%, and at 6 months 47.1%) ? ? ? ?Assessment: ?Kyle Pitts is a 61 y.o. male with history of alcoholic cirrhosis and recurrent ascites (Child Pugh B, MELD 22).  After preliminary evaluation, this patient is a possible candidate for TIPS given  primary MELD elevation is secondary to hepatorenal syndrome, which would likely be improved after TIPS creation, especially given diuretic limitations due to kidney function. ? ?Recommendation: ?Formal consult for TIPS could be considered.  The patient's Gastroenterologist, Dr. Candis Schatz, will be contacted for further discussion. ? ? ? ?Electronically Signed: ?Suzette Battiest, MD ?07/25/2021, 3:59 PM ? ? ? ?

## 2021-07-25 NOTE — Procedures (Addendum)
?  PROCEDURE SUMMARY: ? ?Successful ultrasound guided paracentesis from the right lower quadrant.  ?Yielded 8.4 L of clear, yellow fluid.  ?No immediate complications.  ?The patient tolerated the procedure well.  ? ?Specimen was sent for labs. ? ?EBL < 77mL ? ?The patient has required >/=2 paracenteses in a 30 day period and a formal evaluation by the [facility] Interventional Radiology Portal Hypertension Clinic has been arranged. ? ?  ? ? ?Alex Gardener, AGNP-BC ?07/25/2021, 2:59 PM ? ? ?

## 2021-07-26 LAB — SODIUM, URINE, RANDOM: Sodium, Ur: <10 mmol/L — ABNORMAL LOW (ref 28–272)

## 2021-07-26 LAB — POTASSIUM, URINE, RANDOM: Potassium Urine: 71 mmol/L (ref 12–129)

## 2021-07-26 LAB — CREATININE, URINE, RANDOM: Creatinine, Urine: 181 mg/dL (ref 20–320)

## 2021-07-26 NOTE — Progress Notes (Signed)
I spoke with Mr. Rhines today and informed him that his ascitic fluid was growing GPCs.  He said he was feeling fine and feels much better since getting the fluid removed yesterday.   He denied symptoms of abdominal pain, weakness/lethargy, fevers/chills, nausea/vomiting, decreased appetite.   ?His PNC count was not elevated on the cell count. ? ?I suspect the bacteria are skin contaminant and I informed the patient of this.  We did discuss the fact that SBP can present initially without symptoms and that SBP can be a serious and fatal infection.  Although I suspect the GPCs in his ascites are contaminant, I recommended the patient immediately go to the Emergency Department the moment he felt unwell.  If the GPCs grow out a bacteria that is atypical for skin contaminant, we will treat with antibiotics.  ? ?We also discussed TIPS as an option for better managing his ascites, and he is interested but needs to think it over more.  He has an appointment with me later this month and we can discuss it further then, if needed. ? ?

## 2021-07-28 ENCOUNTER — Other Ambulatory Visit: Payer: Self-pay

## 2021-07-28 ENCOUNTER — Telehealth: Payer: Self-pay | Admitting: Gastroenterology

## 2021-07-28 ENCOUNTER — Ambulatory Visit (HOSPITAL_COMMUNITY): Payer: Medicaid Other

## 2021-07-28 ENCOUNTER — Other Ambulatory Visit: Payer: Self-pay | Admitting: Internal Medicine

## 2021-07-28 DIAGNOSIS — K7031 Alcoholic cirrhosis of liver with ascites: Secondary | ICD-10-CM

## 2021-07-28 LAB — CULTURE, BODY FLUID W GRAM STAIN -BOTTLE

## 2021-07-28 LAB — PATHOLOGIST SMEAR REVIEW

## 2021-07-28 MED ORDER — LACTULOSE ENCEPHALOPATHY 10 GM/15ML PO SOLN: 20.0000 g | Freq: Two times a day (BID) | ORAL | 3 refills | Status: DC

## 2021-07-28 NOTE — Telephone Encounter (Signed)
Inbound call from patient requesting medication refill for Lactulose sent to [facility] on [address]  ?

## 2021-07-28 NOTE — Telephone Encounter (Signed)
Refill of Lactulose sent to patients pharmacy. ?

## 2021-07-29 ENCOUNTER — Telehealth: Payer: Self-pay | Admitting: Gastroenterology

## 2021-07-29 NOTE — Telephone Encounter (Signed)
Inbound call from patient states he need a paracentesis and would like it to be Thursday 07/31/2021 ?

## 2021-07-29 NOTE — Telephone Encounter (Signed)
Pt calling for another paracentesis. Last one was done last week. Previously I believe you had stated ok for him to have one every 2 weeks. Please advise. ?

## 2021-07-30 ENCOUNTER — Other Ambulatory Visit: Payer: Self-pay

## 2021-07-30 ENCOUNTER — Telehealth: Payer: Self-pay | Admitting: Gastroenterology

## 2021-07-30 DIAGNOSIS — K7031 Alcoholic cirrhosis of liver with ascites: Secondary | ICD-10-CM

## 2021-07-30 NOTE — Telephone Encounter (Signed)
Pt scheduled for IR para at [facility] 07/31/21 at 2pm, pt to arrive there at 1:30pm. Pt is scheduled for a phone call visit with IR to discuss tips tomorrow at 3pm. Max amt to be drawn off 10 liters, fluid to be sent for cell count and diff. Albumin per IR protocol. Orders per Dr. Tomasa Rand. ?

## 2021-07-30 NOTE — Telephone Encounter (Signed)
Inbound call from patient would like a call back. States he will need to reschedule his paracentisis for Thursday to a different time because he have a televisit with his doctor at 3 ?

## 2021-07-30 NOTE — Telephone Encounter (Signed)
Pt states he needs to reschedule the TIPS consultation. Pt given the phone number [phone] to reschedule the appt. ?

## 2021-07-31 ENCOUNTER — Ambulatory Visit (HOSPITAL_COMMUNITY)
Admission: RE | Admit: 2021-07-31 | Discharge: 2021-07-31 | Disposition: A | Discharge status: Home / Self Care | Payer: Medicaid Other | Source: Ambulatory Visit | Attending: Gastroenterology | Admitting: Gastroenterology

## 2021-07-31 ENCOUNTER — Telehealth: Payer: Medicaid Other

## 2021-07-31 DIAGNOSIS — K7031 Alcoholic cirrhosis of liver with ascites: Secondary | ICD-10-CM

## 2021-07-31 HISTORY — PX: IR PARACENTESIS: IMG2679

## 2021-07-31 LAB — BODY FLUID CELL COUNT WITH DIFFERENTIAL
Eos, Fluid: 0 %
Lymphs, Fluid: 35 %
Monocyte-Macrophage-Serous Fluid: 62 % (ref 50–90)
Neutrophil Count, Fluid: 3 % (ref 0–25)
Total Nucleated Cell Count, Fluid: 94 cu mm (ref 0–1000)

## 2021-07-31 MED ORDER — LIDOCAINE HCL 1 % IJ SOLN
INTRAMUSCULAR | Status: DC | PRN
  Administered 2021-07-31: 10 mL

## 2021-07-31 MED ORDER — ALBUMIN HUMAN 25 % IV SOLN
50.0000 g | Freq: Once | INTRAVENOUS | Status: AC
Start: 2021-07-31 — End: 2021-07-31
  Administered 2021-07-31: 50 g via INTRAVENOUS
  Filled 2021-07-31: qty 200

## 2021-07-31 MED ORDER — ALBUMIN HUMAN 25 % IV SOLN
INTRAVENOUS | Status: AC
  Filled 2021-07-31: qty 200

## 2021-07-31 MED ORDER — LIDOCAINE HCL 1 % IJ SOLN
INTRAMUSCULAR | Status: AC
  Filled 2021-07-31: qty 20

## 2021-07-31 NOTE — Procedures (Signed)
PROCEDURE SUMMARY: ? ?Successful ultrasound guided paracentesis from the right lower quadrant.  ?Yielded 8.4 liters of clear, yellow fluid.  ?No immediate complications.  ?The patient tolerated the procedure well.  ? ?Specimen was sent for labs. ? ?EBL < 71mL ? ?The patient has previously been formally evaluated by the [facility] Interventional Radiology Portal Hypertension Clinic and is being actively followed for potential future intervention. ? ?Loyce Dys, MS RD PA-C ? ? ?

## 2021-08-01 LAB — PATHOLOGIST SMEAR REVIEW

## 2021-08-05 ENCOUNTER — Telehealth: Payer: Self-pay

## 2021-08-05 ENCOUNTER — Other Ambulatory Visit: Payer: Self-pay

## 2021-08-05 DIAGNOSIS — R188 Other ascites: Secondary | ICD-10-CM

## 2021-08-05 NOTE — Telephone Encounter (Signed)
[facility] pt calling requesting a paracentesis. Pt had a para on 4/13 last. Please advise as DOD. Previous orders below: ?  ?Please place order for IR LVP, max 10 L with IV albumin, and send fluid for cell count/Gram stain/culture   ?

## 2021-08-05 NOTE — Telephone Encounter (Signed)
Pt given the phone number [phone] to move his appt to the date he needs it. ?

## 2021-08-05 NOTE — Telephone Encounter (Signed)
Pt scheduled for IR para as ordered below. Appt at [facility] 08/06/21 at 2pm, pt to arrive there at 1:30pm. Pt aware of appt. ?

## 2021-08-05 NOTE — Telephone Encounter (Signed)
Patient calling back to discuss appointment for 08/06/21. Per patient, he needs appointment for the 20th, cannot make it tomorrow (doesn't have a caretaker for his sister the 19th). Please advise.  ?

## 2021-08-05 NOTE — Telephone Encounter (Signed)
Chart reviewed and agree with need for LVP. ? ?OK for IR Paracentesis. ?Max 8 L. ?IV Albumin should be administered. ?Fluid Cell Count/GS/Total Protein/Culture should be sent. ? ?Thanks. ?GM ?

## 2021-08-06 ENCOUNTER — Telehealth: Payer: Self-pay | Admitting: Gastroenterology

## 2021-08-06 ENCOUNTER — Ambulatory Visit (HOSPITAL_COMMUNITY)
Admission: RE | Admit: 2021-08-06 | Discharge: 2021-08-06 | Disposition: A | Discharge status: Home / Self Care | Payer: Medicaid Other | Source: Ambulatory Visit | Attending: Gastroenterology | Admitting: Gastroenterology

## 2021-08-06 DIAGNOSIS — R188 Other ascites: Secondary | ICD-10-CM | POA: Insufficient documentation

## 2021-08-06 HISTORY — PX: IR PARACENTESIS: IMG2679

## 2021-08-06 LAB — BODY FLUID CELL COUNT WITH DIFFERENTIAL
Eos, Fluid: 0 %
Lymphs, Fluid: 34 %
Monocyte-Macrophage-Serous Fluid: 62 % (ref 50–90)
Neutrophil Count, Fluid: 4 % (ref 0–25)
Total Nucleated Cell Count, Fluid: 27 cu mm (ref 0–1000)

## 2021-08-06 LAB — GRAM STAIN

## 2021-08-06 LAB — PROTEIN, PLEURAL OR PERITONEAL FLUID: Total protein, fluid: <3 g/dL

## 2021-08-06 MED ORDER — LIDOCAINE HCL (PF) 1 % IJ SOLN
INTRAMUSCULAR | Status: DC | PRN
  Administered 2021-08-06: 10 mL

## 2021-08-06 MED ORDER — ALBUMIN HUMAN 25 % IV SOLN
INTRAVENOUS | Status: AC
Start: 2021-08-06 — End: 2021-08-06
  Administered 2021-08-06: 50 g via INTRAVENOUS
  Filled 2021-08-06: qty 200

## 2021-08-06 MED ORDER — ALBUMIN HUMAN 25 % IV SOLN
50.0000 g | Freq: Once | INTRAVENOUS | Status: AC
Start: 2021-08-06 — End: 2021-08-06

## 2021-08-06 MED ORDER — LIDOCAINE HCL 1 % IJ SOLN
INTRAMUSCULAR | Status: AC
  Filled 2021-08-06: qty 20

## 2021-08-06 NOTE — Telephone Encounter (Signed)
Let pt know it is still scheduled for today at 2pm, he knows to be there at 1:30pm. He did not want the date changed. ?

## 2021-08-06 NOTE — Telephone Encounter (Signed)
Patient called this morning and wanted to know if the date of his paracentesis had been changed or if it was still scheduled for today.  Please call patient and advise.  Thank you. ?

## 2021-08-06 NOTE — Procedures (Signed)
PROCEDURE SUMMARY: ? ?Successful ultrasound guided paracentesis from the right lower quadrant.  ?Yielded 8L of clear yellow fluid.  ?No immediate complications.  ?The patient tolerated the procedure well.  ? ?Specimen sent for labs. ? ?EBL < 28mL ? ?The patient has previously been formally evaluated by the [facility] Interventional Radiology Portal Hypertension Clinic and is being actively followed for potential future intervention. ? ? ?Patient scheduled for clinic evaluation 08/11/21 with Dr. Elby Showers.  ? Alwyn Ren, AGACNP-BC ?[phone] ?08/06/2021, 3:35 PM ? ? ? ?

## 2021-08-07 LAB — PATHOLOGIST SMEAR REVIEW

## 2021-08-11 ENCOUNTER — Encounter: Payer: Self-pay | Admitting: *Deleted

## 2021-08-11 ENCOUNTER — Ambulatory Visit
Admission: RE | Admit: 2021-08-11 | Discharge: 2021-08-11 | Disposition: A | Discharge status: Home / Self Care | Payer: Medicaid Other | Source: Ambulatory Visit | Attending: Internal Medicine | Admitting: Internal Medicine

## 2021-08-11 DIAGNOSIS — K7031 Alcoholic cirrhosis of liver with ascites: Secondary | ICD-10-CM

## 2021-08-11 HISTORY — PX: IR RADIOLOGIST EVAL & MGMT: IMG5224

## 2021-08-11 LAB — CULTURE, BODY FLUID W GRAM STAIN -BOTTLE: Culture: NO GROWTH

## 2021-08-11 NOTE — Consult Note (Signed)
? ?Chief Complaint: ?Patient was seen in consultation today for TIPS evaluation ? ?Referring Physician(s):  ?Dustin Flock, MD ? ?History of Present Illness: ?Kyle Pitts is a 61 y.o. male with history of alcoholic cirrhosis, initially diagnosed in 2022.  He has undergone 4 paracenteses in the last month, and 6 over the last 2 months.  Frequent paracenteses have occurred since last November.  He states that right now he feels full and like he needs another as it limits his breathing and mobility.  He has no history of hepatic hydrothorax, and only a single episode of hepatic encephalopathy which has been well controlled on lactulose daily.  He has never been evaluated for liver transplant, but is not opposed.  No history of hepatocellular carcinoma or other malignancy.  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  He has no history of hematemesis or bloody stools.  He's currently taking furosemide 40 mg daily and spironolactone 100 mg daily, increased by Dr. Candis Schatz several months ago.  He states he has seen no difference, and maybe an increase, in his ascitic volume since increasing diuretics.  These have been challenging to manage given his poor renal function.  No history of cardiac dysfunction. ? 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  In addition, he's helping take care of his ill sister who now lives with him.  His frequent paracenteses are significantly limiting what he's able to do. ? ? ?Past Medical History:  ?Diagnosis Date  ? Allergic reaction 11/21/2020  ? Cirrhosis ([place])   ? Hyponatremia   ? Myofasciitis 11/21/2020  ? Osteomyelitis of right leg ([place]) 11/21/2020  ? Septic arthritis ([place])   ? Streptococcus infection, group A 11/21/2020  ? ? ?Past  Surgical History:  ?Procedure Laterality Date  ? BIOPSY  02/15/2021  ? Procedure: BIOPSY;  Surgeon: Daryel November, MD;  Location: [facility] ENDOSCOPY;  Service: Gastroenterology;;  ? ESOPHAGOGASTRODUODENOSCOPY N/A 02/15/2021  ? Procedure: ESOPHAGOGASTRODUODENOSCOPY (EGD);  Surgeon: Daryel November, MD;  Location: [place];  Service: Gastroenterology;  Laterality: N/A;  ? ESOPHAGOGASTRODUODENOSCOPY (EGD) WITH PROPOFOL N/A 03/19/2021  ? Procedure: ESOPHAGOGASTRODUODENOSCOPY (EGD) WITH PROPOFOL;  Surgeon: Sharyn Creamer, MD;  Location: [place];  Service: Gastroenterology;  Laterality: N/A;  ? HEMOSTASIS CLIP PLACEMENT  03/19/2021  ? Procedure: HEMOSTASIS CLIP PLACEMENT;  Surgeon: Sharyn Creamer, MD;  Location: [facility] ENDOSCOPY;  Service: Gastroenterology;;  ? I & D EXTREMITY Right 10/09/2020  ? Procedure: IRRIGATION AND DEBRIDEMENT EXTREMITY;  Surgeon: Nicholes Stairs, MD;  Location: [facility];  Service: Orthopedics;  Laterality: Right;  ? IR PARACENTESIS  03/18/2021  ? IR PARACENTESIS  04/09/2021  ? IR PARACENTESIS  04/22/2021  ? IR PARACENTESIS  05/02/2021  ? IR PARACENTESIS  05/16/2021  ? IR PARACENTESIS  05/29/2021  ? IR PARACENTESIS  06/13/2021  ? IR PARACENTESIS  06/25/2021  ? IR PARACENTESIS  07/04/2021  ? IR PARACENTESIS  07/16/2021  ? IR PARACENTESIS  07/25/2021  ? IR PARACENTESIS  07/31/2021  ? IR PARACENTESIS  08/06/2021  ? KNEE ARTHROSCOPY Right 09/27/2020  ? Procedure: ARTHROSCOPY KNEE, [place] OUT;  Surgeon: Melina Schools, MD;  Location: [facility];  Service: Orthopedics;  Laterality: Right;  ? KNEE ARTHROSCOPY Right 10/09/2020  ? Procedure: ARTHROSCOPY KNEE,ARTHROSCOPIC I & D, CALF  ABSCESS;  Surgeon: Nicholes Stairs, MD;  Location: [facility];  Service: Orthopedics;  Laterality: Right;  ? ? ?Allergies: ?Latex, Tape, Amoxicillin, Cephalexin, and Penicillins ? ?Medications: ?Prior to Admission medications   ?Medication Sig Start Date End Date Taking? Authorizing Provider  ?furosemide (LASIX) 40 MG tablet Take 1  tablet (40 mg total) by mouth daily. 05/14/21 05/09/22  Daryel November, MD  ?lactulose, encephalopathy, (CHRONULAC) 10 GM/15ML SOLN Take 30 mLs (20 g total) by mouth 2 (two) times daily. 07/28/21   Daryel November, MD  ?midodrine (PROAMATINE) 10 MG tablet Take 1 tablet (10 mg total) by mouth 3 (three) times daily with meals. 06/25/21   Daryel November, MD  ?pantoprazole (PROTONIX) 40 MG tablet Take 1 tablet (40 mg total) by mouth 2 (two) times daily. 05/14/21 05/09/22  Daryel November, MD  ?spironolactone (ALDACTONE) 25 MG tablet Take 2 tablets (50 mg total) by mouth 2 (two) times daily. 06/25/21   Daryel November, MD  ?  ? ?Family History  ?Problem Relation Age of Onset  ? Hepatitis Mother   ? Cirrhosis Mother   ? Stroke Father   ? Heart failure Father   ? Colon cancer Father   ? Kidney failure Brother   ? Liver cancer Brother   ? Stomach cancer Neg Hx   ? Esophageal cancer Neg Hx   ? ? ?Social History  ? ?Socioeconomic History  ? Marital status: Divorced  ?  Spouse name: Not on file  ? Number of children: Not on file  ? Years of education: Not on file  ? Highest education level: Not on file  ?Occupational History  ? Not on file  ?Tobacco Use  ? Smoking status: Never  ? Smokeless tobacco: Never  ?Vaping Use  ? Vaping Use: Never used  ?Substance and Sexual Activity  ? Alcohol use: Yes  ?  Alcohol/week: 24.0 standard drinks  ?  Types: 24 Cans of beer per week  ?  Comment: 3-4 when home from work  ? Drug use: Not Currently  ?  Types: Cocaine, Marijuana  ?  Comment: last used 1 month ago  ? Sexual activity: Not Currently  ?Other Topics Concern  ? Not on file  ?Social History Narrative  ? Not on file  ? ?Social Determinants of Health  ? ?Financial Resource Strain: Not on file  ?Food Insecurity: Not on file  ?Transportation Needs: Not on file  ?Physical Activity: Not on file  ?Stress: Not on file  ?Social Connections: Not on file  ? ? ?Review of Systems: A 12 point ROS discussed and pertinent positives are  indicated in the HPI above.  All other systems are negative. ? ?Vital Signs: ?There were no vitals taken for this visit. ? ?No physical examination was performed in lieu of virtual telephone clinic visit. ? ? ?Imaging: ?Prior cross sectional imaging of portal system: ?Non-con CT AP 03/15/21 ?Cirrhosis, ascites, normal caliber main and central portal veins. ?  ?Echocardiogram: None available ? ? ? ?Labs: ?07/24/21 ?Creatinine: 1.62           ?Total Bilirubin: 1.9 ?INR: 1.3 ?Sodium: 130 ?Albumin: 2.8 ?  ?Child-Pugh = B points, class 8 ?MELD = 22 (19.6% estimated 3 month mortality) ?[place] Index of Post-TIPS Survival (FIPS) = 1.28 (Overall survival at 1 month 84.8%, at 3 months 59.6%, and at 6 months 47.1%) ? ? ? ? ?Assessment and Plan: ?MARKEZ ULRICH is a 61 y.o. male with history of alcoholic cirrhosis and  recurrent ascites (Child Pugh B, MELD 22) with a degree of hepatorenal syndrome affecting maximization of diuresis.  Given that elevated MELD is primarily due to renal function, TIPS creation would likely benefit and reverse this process in addition to providing relief from ascites production. ? ?Risks and benefits of TIPS and/or additional variceal embolization were discussed with the patient including, but not limited to, infection, bleeding, damage to adjacent structures, worsening hepatic and/or cardiac function, worsening and/or the development of altered mental status/encephalopathy, non-target embolization and death.  ? ?All of the patient's questions were answered, patient is agreeable to proceed. ? ? ?-will need to obtain echocardiogram prior to TIPS ?-plan for TIPS creation at [facility] with general anesthesia ? ? ? ? ? ?Ruthann Cancer, MD ?Pager: [phone] ?Clinic: [phone] ? ? ? ?I spent a total of  60 Minutes  in virtual telephone clinical consultation, greater than 50% of which was counseling/coordinating care for portal hypertension. ? ?

## 2021-08-12 ENCOUNTER — Other Ambulatory Visit: Payer: Self-pay

## 2021-08-12 ENCOUNTER — Telehealth: Payer: Self-pay | Admitting: Gastroenterology

## 2021-08-12 ENCOUNTER — Ambulatory Visit (INDEPENDENT_AMBULATORY_CARE_PROVIDER_SITE_OTHER): Payer: Medicaid Other | Admitting: Gastroenterology

## 2021-08-12 ENCOUNTER — Ambulatory Visit: Payer: Medicaid Other | Admitting: Gastroenterology

## 2021-08-12 ENCOUNTER — Other Ambulatory Visit (INDEPENDENT_AMBULATORY_CARE_PROVIDER_SITE_OTHER): Payer: Medicaid Other

## 2021-08-12 ENCOUNTER — Telehealth: Payer: Self-pay

## 2021-08-12 ENCOUNTER — Other Ambulatory Visit: Payer: Self-pay | Admitting: Interventional Radiology

## 2021-08-12 ENCOUNTER — Encounter: Payer: Self-pay | Admitting: Gastroenterology

## 2021-08-12 VITALS — BP 126/84 | HR 88 | Ht 72.0 in | Wt 209.8 lb

## 2021-08-12 DIAGNOSIS — K7031 Alcoholic cirrhosis of liver with ascites: Secondary | ICD-10-CM

## 2021-08-12 DIAGNOSIS — K7682 Hepatic encephalopathy: Secondary | ICD-10-CM

## 2021-08-12 DIAGNOSIS — Z800 Family history of malignant neoplasm of digestive organs: Secondary | ICD-10-CM | POA: Diagnosis not present

## 2021-08-12 DIAGNOSIS — Z8711 Personal history of peptic ulcer disease: Secondary | ICD-10-CM

## 2021-08-12 DIAGNOSIS — D500 Iron deficiency anemia secondary to blood loss (chronic): Secondary | ICD-10-CM

## 2021-08-12 DIAGNOSIS — T50905A Adverse effect of unspecified drugs, medicaments and biological substances, initial encounter: Secondary | ICD-10-CM

## 2021-08-12 DIAGNOSIS — R188 Other ascites: Secondary | ICD-10-CM

## 2021-08-12 DIAGNOSIS — K31819 Angiodysplasia of stomach and duodenum without bleeding: Secondary | ICD-10-CM

## 2021-08-12 DIAGNOSIS — N183 Chronic kidney disease, stage 3 unspecified: Secondary | ICD-10-CM

## 2021-08-12 DIAGNOSIS — N62 Hypertrophy of breast: Secondary | ICD-10-CM

## 2021-08-12 LAB — CBC WITH DIFFERENTIAL/PLATELET
Basophils Absolute: 0.1 10*3/uL (ref 0.0–0.1)
Basophils Relative: 0.9 % (ref 0.0–3.0)
Eosinophils Absolute: 0.3 10*3/uL (ref 0.0–0.7)
Eosinophils Relative: 5.8 % — ABNORMAL HIGH (ref 0.0–5.0)
HCT: 27.6 % — ABNORMAL LOW (ref 39.0–52.0)
Hemoglobin: 9.3 g/dL — ABNORMAL LOW (ref 13.0–17.0)
Lymphocytes Relative: 15.8 % (ref 12.0–46.0)
Lymphs Abs: 0.9 10*3/uL (ref 0.7–4.0)
MCHC: 33.7 g/dL (ref 30.0–36.0)
MCV: 97.6 fl (ref 78.0–100.0)
Monocytes Absolute: 0.8 10*3/uL (ref 0.1–1.0)
Monocytes Relative: 13.4 % — ABNORMAL HIGH (ref 3.0–12.0)
Neutro Abs: 3.8 10*3/uL (ref 1.4–7.7)
Neutrophils Relative %: 64.1 % (ref 43.0–77.0)
Platelets: 180 10*3/uL (ref 150.0–400.0)
RBC: 2.83 Mil/uL — ABNORMAL LOW (ref 4.22–5.81)
RDW: 15.6 % — ABNORMAL HIGH (ref 11.5–15.5)
WBC: 5.9 10*3/uL (ref 4.0–10.5)

## 2021-08-12 LAB — BASIC METABOLIC PANEL
BUN: 20 mg/dL (ref 6–23)
CO2: 20 mEq/L (ref 19–32)
Calcium: 8.7 mg/dL (ref 8.4–10.5)
Chloride: 106 mEq/L (ref 96–112)
Creatinine, Ser: 1.69 mg/dL — ABNORMAL HIGH (ref 0.40–1.50)
GFR: 43.93 mL/min — ABNORMAL LOW (ref >60.00)
Glucose, Bld: 112 mg/dL — ABNORMAL HIGH (ref 70–99)
Potassium: 3.4 mEq/L — ABNORMAL LOW (ref 3.5–5.1)
Sodium: 132 mEq/L — ABNORMAL LOW (ref 135–145)

## 2021-08-12 NOTE — Telephone Encounter (Signed)
Patient called, states he has an appointment today. Per patient, he wanted to see if we can arrange for him to have a paracentesis done. Please advise.  ?

## 2021-08-12 NOTE — Patient Instructions (Addendum)
If you are age 60 or older, your body mass index should be between 23-30. Your Body mass index is 28.45 kg/m?Kyle Pitts If this is out of the aforementioned range listed, please consider follow up with your Primary Care Provider. ? ?If you are age 50 or younger, your body mass index should be between 19-25. Your Body mass index is 28.45 kg/m?Kyle Pitts If this is out of the aformentioned range listed, please consider follow up with your Primary Care Provider.  ? ?It has been recommended to you by your physician that you have a(n) Endoscopy and Colonoscopy completed. Per your request, we did not schedule the procedure(s) today. Please contact our office at [phone] should you decide to have the procedure completed. You will be scheduled for a pre-visit and procedure at that time.  ? ?We will send a referral to Transplant and Hepatology. ? ?You have been schedule for a paracentesis at [facility] at 3pm please arrive at 2 pm.   ? ?Your provider has requested that you go to the basement level for lab work before leaving today. Press "B" on the elevator. The lab is located at the first door on the left as you exit the elevator. ? ? ?Take Lasix Twice daily until Paracentesis. ? ?The [facility] providers would like to encourage you to use [facility] to communicate with providers for non-urgent requests or questions.  Due to long hold times on the telephone, sending your provider a message by [facility] may be a faster and more efficient way to get a response.  Please allow 48 business hours for a response.  Please remember that this is for non-urgent requests.  ? ?Due to recent changes in healthcare laws, you may see the results of your imaging and laboratory studies on MyChart before your provider has had a chance to review them.  We understand that in some cases there may be results that are confusing or concerning to you. Not all laboratory results come back in the same time frame and the provider may be waiting for multiple results in  order to interpret others.  Please give [place] 48 hours in order for your provider to thoroughly review all the results before contacting the office for clarification of your results.  ? ?It was a pleasure to see you today! ? ?Thank you for trusting me with your gastrointestinal care!   ? ?Scott E.Tomasa Rand, MD  ? ? ?

## 2021-08-12 NOTE — Telephone Encounter (Signed)
Pt here for OV and very full/abdomen distended. [facility] cannot do para today. [facility] [place] can take off 4 liters of fluid tomorrow but they will not give albumin. Pt scheduled for tomorrow at 3pm, to arrive at 2:30pm. He is also scheduled at [facility] 4/27 at 10am, pt to arrive there at 9:30am. Orders faxed to IR at [phone] for IR para to be done every 7 days, 10 liters removed, fluid for cell count and diff, IV albumin per radiology protocol. ?

## 2021-08-12 NOTE — Telephone Encounter (Signed)
Spoke with pt and let him know radiology usually does not schedule IR paras on the same day. Pt will be here for his appt and discuss further with Dr. Tomasa Rand. ?

## 2021-08-12 NOTE — Progress Notes (Signed)
? ?HPI : Kyle Pitts is a very pleasant 61 year old male with decompensated alcoholic cirrhosis with refractory ascites, hepatic encephalopathy and renal insufficiency presenting for follow-up.  The patient's therapeutic paracentesis requirements have been increasing, now requiring a paracentesis on a weekly basis, frequently removing 8+ L of fluid each time.  He is taking Lasix 40 mg in the morning and Aldactone 50 mg in the evening.  He reports adhering to a low-sodium diet, but admits to not tracking sodium intake carefully or really knowing how much sodium he is getting on average day.  He recently reported eating hotdogs for dinner. ?He recently met with Dr. Serafina Royals of the interventional radiology department, and is planning to proceed with a TIPS procedure. ?Today, the patient reports feeling very distended from his ascites and is requesting an ASAP paracentesis.  His last paracentesis was April 19 and 8 L of fluid was removed. ?Other than his distended abdomen causing difficulty breathing, he feels pretty good.  He has maintained abstinence from alcohol, with his last drink being in October 2022. ?He does report having tender, swollen breast tissue bilaterally. ?He has a history of bleeding peptic ulcer disease, secondary to NSAID use.  He has not had a repeat upper endoscopy to confirm healing of his gastric ulcers.  He also was noted to have gastric antral vascular ectasia, portal hypertensive gastropathy and nonbleeding esophageal varices on his EGD. ?He continues to take Protonix daily. ?He takes lactulose 1-2 times per day for his history of encephalopathy.  He has not had any problems with encephalopathy since his admission in November ? ?The patient is helping take care of his sister who has significant medical problems.  She is currently in the hospital at [facility]. ? ? ?Past Medical History:  ?Diagnosis Date  ? Allergic reaction 11/21/2020  ? Cirrhosis ([place])   ? Hyponatremia   ? Myofasciitis  11/21/2020  ? Osteomyelitis of right leg ([place]) 11/21/2020  ? Septic arthritis ([place])   ? Streptococcus infection, group A 11/21/2020  ? ? ? ?Past Surgical History:  ?Procedure Laterality Date  ? BIOPSY  02/15/2021  ? Procedure: BIOPSY;  Surgeon: Daryel November, MD;  Location: [facility] ENDOSCOPY;  Service: Gastroenterology;;  ? ESOPHAGOGASTRODUODENOSCOPY N/A 02/15/2021  ? Procedure: ESOPHAGOGASTRODUODENOSCOPY (EGD);  Surgeon: Daryel November, MD;  Location: [place];  Service: Gastroenterology;  Laterality: N/A;  ? ESOPHAGOGASTRODUODENOSCOPY (EGD) WITH PROPOFOL N/A 03/19/2021  ? Procedure: ESOPHAGOGASTRODUODENOSCOPY (EGD) WITH PROPOFOL;  Surgeon: Sharyn Creamer, MD;  Location: [place];  Service: Gastroenterology;  Laterality: N/A;  ? HEMOSTASIS CLIP PLACEMENT  03/19/2021  ? Procedure: HEMOSTASIS CLIP PLACEMENT;  Surgeon: Sharyn Creamer, MD;  Location: [facility] ENDOSCOPY;  Service: Gastroenterology;;  ? I & D EXTREMITY Right 10/09/2020  ? Procedure: IRRIGATION AND DEBRIDEMENT EXTREMITY;  Surgeon: Nicholes Stairs, MD;  Location: [facility] ORS;  Service: Orthopedics;  Laterality: Right;  ? IR PARACENTESIS  03/18/2021  ? IR PARACENTESIS  04/09/2021  ? IR PARACENTESIS  04/22/2021  ? IR PARACENTESIS  05/02/2021  ? IR PARACENTESIS  05/16/2021  ? IR PARACENTESIS  05/29/2021  ? IR PARACENTESIS  06/13/2021  ? IR PARACENTESIS  06/25/2021  ? IR PARACENTESIS  07/04/2021  ? IR PARACENTESIS  07/16/2021  ? IR PARACENTESIS  07/25/2021  ? IR PARACENTESIS  07/31/2021  ? IR PARACENTESIS  08/06/2021  ? IR RADIOLOGIST EVAL & MGMT  08/11/2021  ? KNEE ARTHROSCOPY Right 09/27/2020  ? Procedure: ARTHROSCOPY KNEE, [place] OUT;  Surgeon: Melina Schools, MD;  Location: [facility];  Service: Orthopedics;  Laterality: Right;  ? KNEE ARTHROSCOPY Right 10/09/2020  ? Procedure: ARTHROSCOPY KNEE,ARTHROSCOPIC I & D, CALF ABSCESS;  Surgeon: Nicholes Stairs, MD;  Location: [facility] ORS;  Service: Orthopedics;  Laterality: Right;  ? ?Family History  ?Problem Relation Age of  Onset  ? Hepatitis Mother   ? Cirrhosis Mother   ? Stroke Father   ? Heart failure Father   ? Colon cancer Father   ? Kidney failure Brother   ? Liver cancer Brother   ? Stomach cancer Neg Hx   ? Esophageal cancer Neg Hx   ? ?Social History  ? ?Tobacco Use  ? Smoking status: Never  ? Smokeless tobacco: Never  ?Vaping Use  ? Vaping Use: Never used  ?Substance Use Topics  ? Alcohol use: Yes  ?  Alcohol/week: 24.0 standard drinks  ?  Types: 24 Cans of beer per week  ?  Comment: 3-4 when home from work  ? Drug use: Not Currently  ?  Types: Cocaine, Marijuana  ?  Comment: last used 1 month ago  ? ?Current Outpatient Medications  ?Medication Sig Dispense Refill  ? furosemide (LASIX) 40 MG tablet Take 1 tablet (40 mg total) by mouth daily. 90 tablet 3  ? lactulose, encephalopathy, (CHRONULAC) 10 GM/15ML SOLN Take 30 mLs (20 g total) by mouth 2 (two) times daily. 946 mL 3  ? midodrine (PROAMATINE) 10 MG tablet Take 1 tablet (10 mg total) by mouth 3 (three) times daily with meals. 90 tablet 3  ? pantoprazole (PROTONIX) 40 MG tablet Take 1 tablet (40 mg total) by mouth 2 (two) times daily. 180 tablet 3  ? spironolactone (ALDACTONE) 25 MG tablet Take 2 tablets (50 mg total) by mouth 2 (two) times daily. 120 tablet 3  ? ?No current facility-administered medications for this visit.  ? ?Allergies  ?Allergen Reactions  ? Latex Itching  ? Tape Itching  ? Amoxicillin Other (See Comments)  ?  Itching around eyes and scalp on amoxicilin  ? Cephalexin Nausea Only  ?  Upset stomach ?Ceftriaxone is ok  ? Penicillins Rash  ? ? ? ?Review of Systems: ?All systems reviewed and negative except where noted in HPI.  ? ? ?IR Radiologist [place] ? ?Result Date: 08/11/2021 ?Please refer to notes tab for details about interventional procedure. (Op Note) ? ?IR Paracentesis ? ?Result Date: 08/06/2021 ?INDICATION: Patient with a history of alcoholic cirrhosis and recurrent large volume ascites presents today for a diagnostic and therapeutic  paracentesis. 8 L max EXAM: ULTRASOUND GUIDED PARACENTESIS MEDICATIONS: 1% lidocaine 10 mL COMPLICATIONS: None immediate. PROCEDURE: Informed written consent was obtained from the patient after a discussion of the risks, benefits and alternatives to treatment. A timeout was performed prior to the initiation of the procedure. Initial ultrasound scanning demonstrates a large amount of ascites within the right lower abdominal quadrant. The right lower abdomen was prepped and draped in the usual sterile fashion. 1% lidocaine was used for local anesthesia. Following this, a 19 gauge, 7-cm, Yueh catheter was introduced. An ultrasound image was saved for documentation purposes. The paracentesis was performed. The catheter was removed and a dressing was applied. The patient tolerated the procedure well without immediate post procedural complication. Patient received post-procedure intravenous albumin; see nursing notes for details. FINDINGS: A total of approximately 8 L of clear yellow fluid was removed. Samples were sent to the laboratory as requested by the clinical team. IMPRESSION: Successful ultrasound-guided paracentesis yielding 8 liters of peritoneal fluid.  Read by: Soyla Dryer, NP PLAN: The patient has previously been formally evaluated by the [facility] Interventional Radiology Portal Hypertension Clinic and is being actively followed for potential future intervention. This patient is scheduled for clinic evaluation August 11, 2021. Electronically Signed   By: Lucrezia Europe M.D.   On: 08/06/2021 15:48  ? ?IR Paracentesis ? ?Result Date: 07/31/2021 ?INDICATION: Patient with history of alcoholic cirrhosis, recurrent ascites. Request is made for diagnostic and therapeutic paracentesis. EXAM: ULTRASOUND GUIDED DIAGNOSTIC AND THERAPEUTIC PARACENTESIS MEDICATIONS: 10 mL 1% lidocaine COMPLICATIONS: None immediate. PROCEDURE: Informed written consent was obtained from the patient after a discussion of the risks, benefits  and alternatives to treatment. A timeout was performed prior to the initiation of the procedure. Initial ultrasound scanning demonstrates a large amount of ascites within the right lower abdominal quadrant. The righ

## 2021-08-13 ENCOUNTER — Ambulatory Visit (HOSPITAL_COMMUNITY)
Admission: RE | Admit: 2021-08-13 | Discharge: 2021-08-13 | Disposition: A | Discharge status: Home / Self Care | Payer: Medicaid Other | Source: Ambulatory Visit | Attending: Gastroenterology | Admitting: Gastroenterology

## 2021-08-13 DIAGNOSIS — R188 Other ascites: Secondary | ICD-10-CM | POA: Insufficient documentation

## 2021-08-13 LAB — BODY FLUID CELL COUNT WITH DIFFERENTIAL
Lymphs, Fluid: 20 %
Monocyte-Macrophage-Serous Fluid: 74 % (ref 50–90)
Neutrophil Count, Fluid: 6 % (ref 0–25)
Total Nucleated Cell Count, Fluid: 100 cu mm (ref 0–1000)

## 2021-08-13 MED ORDER — LIDOCAINE HCL 1 % IJ SOLN
INTRAMUSCULAR | Status: AC
Start: 2021-08-13 — End: 2021-08-13
  Administered 2021-08-13: 10 mL
  Filled 2021-08-13: qty 20

## 2021-08-14 ENCOUNTER — Inpatient Hospital Stay (HOSPITAL_COMMUNITY): Admission: RE | Admit: 2021-08-14 | Payer: Medicaid Other | Source: Ambulatory Visit

## 2021-08-14 ENCOUNTER — Encounter (HOSPITAL_COMMUNITY): Payer: Self-pay

## 2021-08-14 ENCOUNTER — Ambulatory Visit (HOSPITAL_COMMUNITY)
Admission: RE | Admit: 2021-08-14 | Discharge: 2021-08-14 | Disposition: A | Discharge status: Home / Self Care | Payer: Medicaid Other | Source: Ambulatory Visit | Attending: Gastroenterology | Admitting: Gastroenterology

## 2021-08-14 ENCOUNTER — Other Ambulatory Visit: Payer: Self-pay | Admitting: Gastroenterology

## 2021-08-14 DIAGNOSIS — R188 Other ascites: Secondary | ICD-10-CM

## 2021-08-14 HISTORY — PX: IR PARACENTESIS: IMG2679

## 2021-08-14 LAB — BODY FLUID CELL COUNT WITH DIFFERENTIAL
Eos, Fluid: 0 %
Lymphs, Fluid: 65 %
Monocyte-Macrophage-Serous Fluid: 33 % — ABNORMAL LOW (ref 50–90)
Neutrophil Count, Fluid: 2 % (ref 0–25)
Total Nucleated Cell Count, Fluid: 62 cu mm (ref 0–1000)

## 2021-08-14 MED ORDER — LIDOCAINE HCL 1 % IJ SOLN
INTRAMUSCULAR | Status: AC
  Filled 2021-08-14: qty 20

## 2021-08-14 MED ORDER — ALBUMIN HUMAN 25 % IV SOLN
50.0000 g | Freq: Once | INTRAVENOUS | Status: AC
  Administered 2021-08-14: 50 g via INTRAVENOUS

## 2021-08-14 MED ORDER — ALBUMIN HUMAN 25 % IV SOLN
INTRAVENOUS | Status: AC
Start: 2021-08-14 — End: 2021-08-14
  Filled 2021-08-14: qty 200

## 2021-08-14 MED ORDER — LIDOCAINE HCL 1 % IJ SOLN
INTRAMUSCULAR | Status: DC | PRN
  Administered 2021-08-14: 10 mL

## 2021-08-14 NOTE — Procedures (Signed)
PROCEDURE SUMMARY: ? ?Successful US guided diagnostic and therapeutic paracentesis from RLQ.  ?Yielded 7.3 L of clear, yellow fluid.  ?No immediate complications.  ?Pt tolerated well.  ? ?Specimen was sent for labs. ? ?EBL < 1 mL ? ?Tyson Alias, AGNP ?08/14/2021 ?10:38 AM ?  ?

## 2021-08-15 ENCOUNTER — Other Ambulatory Visit (HOSPITAL_COMMUNITY): Payer: Self-pay | Admitting: Interventional Radiology

## 2021-08-15 DIAGNOSIS — K7290 Hepatic failure, unspecified without coma: Secondary | ICD-10-CM

## 2021-08-18 LAB — PATHOLOGIST SMEAR REVIEW

## 2021-08-21 ENCOUNTER — Ambulatory Visit (HOSPITAL_COMMUNITY)
Admission: RE | Admit: 2021-08-21 | Discharge: 2021-08-21 | Disposition: A | Discharge status: Home / Self Care | Payer: Medicaid Other | Source: Ambulatory Visit | Attending: Gastroenterology | Admitting: Gastroenterology

## 2021-08-21 DIAGNOSIS — R188 Other ascites: Secondary | ICD-10-CM | POA: Insufficient documentation

## 2021-08-21 HISTORY — PX: IR PARACENTESIS: IMG2679

## 2021-08-21 LAB — BODY FLUID CELL COUNT WITH DIFFERENTIAL
Eos, Fluid: 0 %
Lymphs, Fluid: 40 %
Monocyte-Macrophage-Serous Fluid: 55 % (ref 50–90)
Neutrophil Count, Fluid: 5 % (ref 0–25)
Total Nucleated Cell Count, Fluid: 89 cu mm (ref 0–1000)

## 2021-08-21 MED ORDER — LIDOCAINE HCL 1 % IJ SOLN
INTRAMUSCULAR | Status: AC
  Filled 2021-08-21: qty 20

## 2021-08-21 MED ORDER — ALBUMIN HUMAN 25 % IV SOLN
INTRAVENOUS | Status: AC
  Filled 2021-08-21: qty 200

## 2021-08-21 MED ORDER — ALBUMIN HUMAN 25 % IV SOLN
50.0000 g | Freq: Once | INTRAVENOUS | Status: AC
  Administered 2021-08-21: 50 g via INTRAVENOUS

## 2021-08-23 LAB — PATHOLOGIST SMEAR REVIEW

## 2021-08-26 ENCOUNTER — Telehealth: Payer: Self-pay | Admitting: Gastroenterology

## 2021-08-26 ENCOUNTER — Other Ambulatory Visit: Payer: Self-pay | Admitting: Gastroenterology

## 2021-08-26 ENCOUNTER — Other Ambulatory Visit (HOSPITAL_COMMUNITY): Payer: Self-pay | Admitting: Gastroenterology

## 2021-08-26 DIAGNOSIS — R188 Other ascites: Secondary | ICD-10-CM

## 2021-08-26 NOTE — Telephone Encounter (Signed)
Patient called asking if he could have a paracentesis set up for this Friday after 2:00 p.m.  Please call patient and advise.  Thank you. ?

## 2021-08-26 NOTE — Telephone Encounter (Signed)
Pt has standing orders with IR for para every 7 days. Pt given the phone number [phone] to call rad scheduling to set up the appt.  ?

## 2021-08-27 ENCOUNTER — Ambulatory Visit (HOSPITAL_COMMUNITY)
Admission: RE | Admit: 2021-08-27 | Discharge: 2021-08-27 | Disposition: A | Discharge status: Home / Self Care | Payer: Medicaid Other | Source: Ambulatory Visit | Attending: Interventional Radiology | Admitting: Interventional Radiology

## 2021-08-27 DIAGNOSIS — I361 Nonrheumatic tricuspid (valve) insufficiency: Secondary | ICD-10-CM

## 2021-08-27 DIAGNOSIS — K7031 Alcoholic cirrhosis of liver with ascites: Secondary | ICD-10-CM

## 2021-08-27 DIAGNOSIS — I340 Nonrheumatic mitral (valve) insufficiency: Secondary | ICD-10-CM | POA: Diagnosis not present

## 2021-08-27 DIAGNOSIS — I081 Rheumatic disorders of both mitral and tricuspid valves: Secondary | ICD-10-CM | POA: Insufficient documentation

## 2021-08-27 LAB — ECHOCARDIOGRAM COMPLETE
Area-P 1/2: 3.21 cm2
S' Lateral: 2.8 cm

## 2021-08-27 NOTE — Progress Notes (Signed)
?  Echocardiogram ?2D Echocardiogram has been performed. ? ?Kyle Pitts ?08/27/2021, 3:37 PM ?

## 2021-08-29 ENCOUNTER — Ambulatory Visit (HOSPITAL_COMMUNITY)
Admission: RE | Admit: 2021-08-29 | Discharge: 2021-08-29 | Disposition: A | Discharge status: Home / Self Care | Payer: Medicaid Other | Source: Ambulatory Visit | Attending: Gastroenterology | Admitting: Gastroenterology

## 2021-08-29 DIAGNOSIS — R188 Other ascites: Secondary | ICD-10-CM | POA: Insufficient documentation

## 2021-08-29 HISTORY — PX: IR PARACENTESIS: IMG2679

## 2021-08-29 LAB — BODY FLUID CELL COUNT WITH DIFFERENTIAL
Eos, Fluid: 0 %
Lymphs, Fluid: 37 %
Monocyte-Macrophage-Serous Fluid: 58 % (ref 50–90)
Neutrophil Count, Fluid: 5 % (ref 0–25)
Total Nucleated Cell Count, Fluid: 87 cu mm (ref 0–1000)

## 2021-08-29 MED ORDER — ALBUMIN HUMAN 25 % IV SOLN
INTRAVENOUS | Status: AC
  Administered 2021-08-29: 50 g via INTRAVENOUS
  Filled 2021-08-29: qty 200

## 2021-08-29 MED ORDER — ALBUMIN HUMAN 25 % IV SOLN: 50.0000 g | Freq: Once | INTRAVENOUS | Status: AC

## 2021-08-29 MED ORDER — LIDOCAINE HCL 1 % IJ SOLN
INTRAMUSCULAR | Status: AC
  Filled 2021-08-29: qty 20

## 2021-08-29 NOTE — Procedures (Addendum)
PROCEDURE SUMMARY: ? ?Successful ultrasound guided paracentesis from the right lower quadrant.  ?Yielded 6.8 of clear yellow tinged fluid.  ?No immediate complications.  ?The patient tolerated the procedure well.  ? ?Specimen was sent for labs. ? ?EBL < 65mL ? ?The patient has previously been evaluated by the [facility] Interventional Radiology Portal Hypertension Clinic, and deemed not a candidate for intervention.  ? ? ?

## 2021-08-30 NOTE — Progress Notes (Signed)
Surgical Instructions ? ? ? Your procedure is scheduled on May 19. ? Report to [facility] Main Entrance "A" at 10:45 A.M., then check in with the Admitting office. ? Call this number if you have problems the morning of surgery: ? [phone] ? ? If you have any questions prior to your surgery date call [phone]: Open Monday-Friday 8am-4pm ? ? ? Remember: ? Do not eat after midnight the night before your surgery ? ?You may drink clear liquids until 9:45 the morning of your surgery.   ?Clear liquids allowed are: Water, Non-Citrus Juices (without pulp), Carbonated Beverages, Clear Tea, Black Coffee ONLY (NO MILK, CREAM OR POWDERED CREAMER of any kind), and Gatorade ?  ? Take these medicines the morning of surgery with A SIP OF WATER:  ? ?             Midodrine (proamatine) ?             Pantoprazole (protonix) ? ? ?As of today, STOP taking any Aspirin (unless otherwise instructed by your surgeon) Aleve, Naproxen, Ibuprofen, Motrin, Advil, Goody's, BC's, all herbal medications, fish oil, and all vitamins. ? ?         ?Do not wear jewelry. ?Do not wear lotions, powders, perfumes/colognes, or deodorant. ?            Men may shave face and neck. ?Do not bring valuables to the hospital. ?Do not wear nail polish, gel polish, artificial nails, or any other type of covering on natural nails (fingers and toes) ?If you have artificial nails or gel coating that need to be removed by a nail salon, please have this removed prior to surgery. Artificial nails or gel coating may interfere with anesthesia's ability to adequately monitor your vital signs. ? ?[facility] is not responsible for any belongings or valuables. .  ? ?Do NOT Smoke (Tobacco/Vaping)  24 hours prior to your procedure ? ?If you use a CPAP at night, you may bring your mask for your overnight stay. ?  ?Contacts, glasses, hearing aids, dentures or partials may not be worn into surgery, please bring cases for these belongings ?  ?For patients admitted to the  hospital, discharge time will be determined by your treatment team. ?  ?Patients discharged the day of surgery will not be allowed to drive home, and someone needs to stay with them for 24 hours. ? ? ?SURGICAL WAITING ROOM VISITATION ?Patients having surgery or a procedure in a hospital may have two support people. ?Children under the age of 68 must have an adult with them who is not the patient. ?They may stay in the waiting area during the procedure and may switch out with other visitors. If the patient needs to stay at the hospital during part of their recovery, the visitor guidelines for inpatient rooms apply. ? ?Please refer to the [facility] website for the visitor guidelines for Inpatients (after your surgery is over and you are in a regular room).  ? ? ? ? ? ?Special instructions:   ? ?Oral Hygiene is also important to reduce your risk of infection.  Remember - BRUSH YOUR TEETH THE MORNING OF SURGERY WITH YOUR REGULAR TOOTHPASTE ? ? ?[place]- Preparing For Surgery ? ?Before surgery, you can play an important role. Because skin is not sterile, your skin needs to be as free of germs as possible. You can reduce the number of germs on your skin by washing with CHG (chlorahexidine gluconate) Soap before surgery.  CHG is an  antiseptic cleaner which kills germs and bonds with the skin to continue killing germs even after washing.   ? ? ?Please do not use if you have an allergy to CHG or antibacterial soaps. If your skin becomes reddened/irritated stop using the CHG.  ?Do not shave (including legs and underarms) for at least 48 hours prior to first CHG shower. It is OK to shave your face. ? ?Please follow these instructions carefully. ?  ? ? Shower the NIGHT BEFORE SURGERY and the MORNING OF SURGERY with CHG Soap.  ? If you chose to wash your hair, wash your hair first as usual with your normal shampoo. After you shampoo, rinse your hair and body thoroughly to remove the shampoo.  Then [employer] and genitals  (private parts) with your normal soap and rinse thoroughly to remove soap. ? ?After that Use CHG Soap as you would any other liquid soap. You can apply CHG directly to the skin and wash gently with a scrungie or a clean washcloth.  ? ?Apply the CHG Soap to your body ONLY FROM THE NECK DOWN.  Do not use on open wounds or open sores. Avoid contact with your eyes, ears, mouth and genitals (private parts). Wash Face and genitals (private parts)  with your normal soap.  ? ?Wash thoroughly, paying special attention to the area where your surgery will be performed. ? ?Thoroughly rinse your body with warm water from the neck down. ? ?DO NOT shower/wash with your normal soap after using and rinsing off the CHG Soap. ? ?Pat yourself dry with a CLEAN TOWEL. ? ?Wear CLEAN PAJAMAS to bed the night before surgery ? ?Place CLEAN SHEETS on your bed the night before your surgery ? ?DO NOT SLEEP WITH PETS. ? ? ?Day of Surgery: ? ?Take a shower with CHG soap. ?Wear Clean/Comfortable clothing the morning of surgery ?Do not apply any deodorants/lotions.   ?Remember to brush your teeth WITH YOUR REGULAR TOOTHPASTE. ? ? ? ?If you received a COVID test during your pre-op visit, it is requested that you wear a mask when out in public, stay away from anyone that may not be feeling well, and notify your surgeon if you develop symptoms. If you have been in contact with anyone that has tested positive in the last 10 days, please notify your surgeon. ? ?  ?Please read over the following fact sheets that you were given.   ?

## 2021-09-01 ENCOUNTER — Encounter (HOSPITAL_COMMUNITY): Payer: Self-pay

## 2021-09-01 ENCOUNTER — Encounter (HOSPITAL_COMMUNITY)
Admission: RE | Admit: 2021-09-01 | Discharge: 2021-09-01 | Disposition: A | Discharge status: Home / Self Care | Payer: Medicaid Other | Source: Ambulatory Visit | Attending: Interventional Radiology | Admitting: Interventional Radiology

## 2021-09-01 ENCOUNTER — Other Ambulatory Visit: Payer: Self-pay

## 2021-09-01 VITALS — BP 112/72 | HR 82 | Temp 98.4°F | Resp 17 | Ht 72.0 in | Wt 200.0 lb

## 2021-09-01 DIAGNOSIS — K7031 Alcoholic cirrhosis of liver with ascites: Secondary | ICD-10-CM

## 2021-09-01 DIAGNOSIS — Z01818 Encounter for other preprocedural examination: Secondary | ICD-10-CM

## 2021-09-01 DIAGNOSIS — Z01812 Encounter for preprocedural laboratory examination: Secondary | ICD-10-CM | POA: Diagnosis present

## 2021-09-01 LAB — COMPREHENSIVE METABOLIC PANEL
ALT: 18 U/L (ref 0–44)
AST: 40 U/L (ref 15–41)
Albumin: 2.8 g/dL — ABNORMAL LOW (ref 3.5–5.0)
Alkaline Phosphatase: 124 U/L (ref 38–126)
Anion gap: 6 (ref 5–15)
BUN: 24 mg/dL — ABNORMAL HIGH (ref 6–20)
CO2: 19 mmol/L — ABNORMAL LOW (ref 22–32)
Calcium: 8.5 mg/dL — ABNORMAL LOW (ref 8.9–10.3)
Chloride: 106 mmol/L (ref 98–111)
Creatinine, Ser: 1.92 mg/dL — ABNORMAL HIGH (ref 0.61–1.24)
GFR, Estimated: 40 mL/min — ABNORMAL LOW (ref >60)
Glucose, Bld: 126 mg/dL — ABNORMAL HIGH (ref 70–99)
Potassium: 4.1 mmol/L (ref 3.5–5.1)
Sodium: 131 mmol/L — ABNORMAL LOW (ref 135–145)
Total Bilirubin: 1.1 mg/dL (ref 0.3–1.2)
Total Protein: 6.6 g/dL (ref 6.5–8.1)

## 2021-09-01 LAB — SURGICAL PCR SCREEN
MRSA, PCR: POSITIVE — AB
Staphylococcus aureus: POSITIVE — AB

## 2021-09-01 LAB — CBC
HCT: 26.8 % — ABNORMAL LOW (ref 39.0–52.0)
Hemoglobin: 8.7 g/dL — ABNORMAL LOW (ref 13.0–17.0)
MCH: 32.3 pg (ref 26.0–34.0)
MCHC: 32.5 g/dL (ref 30.0–36.0)
MCV: 99.6 fL (ref 80.0–100.0)
Platelets: 140 10*3/uL — ABNORMAL LOW (ref 150–400)
RBC: 2.69 MIL/uL — ABNORMAL LOW (ref 4.22–5.81)
RDW: 16.2 % — ABNORMAL HIGH (ref 11.5–15.5)
WBC: 7.1 10*3/uL (ref 4.0–10.5)
nRBC: 0 % (ref 0.0–0.2)

## 2021-09-01 NOTE — Progress Notes (Signed)
PCP - denies ?Cardiologist - denies ? ?PPM/ICD - denies ? ? ?Chest x-ray - 04/17/21 ?EKG - 04/17/21 ?Stress Test - denies ?ECHO - 08/27/21 ?Cardiac Cath - denies ? ?Sleep Study - denies ? ?DM- denies ? ?ASA/Blood Thinner Instructions: n/a ? ? ?ERAS Protcol - yes, no drink ? ? ?COVID TEST- n/a ? ? ?Anesthesia review: no ? ?Patient denies shortness of breath, fever, cough and chest pain at PAT appointment ? ? ?All instructions explained to the patient, with a verbal understanding of the material. Patient agrees to go over the instructions while at home for a better understanding. Patient also instructed to notify surgeon of any contact with COVID+ person or if he develops any symptoms. The opportunity to ask questions was provided. ?  ?

## 2021-09-02 LAB — PATHOLOGIST SMEAR REVIEW

## 2021-09-04 ENCOUNTER — Other Ambulatory Visit: Payer: Self-pay | Admitting: Radiology

## 2021-09-04 DIAGNOSIS — K7031 Alcoholic cirrhosis of liver with ascites: Secondary | ICD-10-CM

## 2021-09-04 NOTE — Progress Notes (Signed)
Kyle Pitts and Dr. Elby Showers notified of patient's surgical PCR results.  No new orders received at this time.

## 2021-09-05 ENCOUNTER — Ambulatory Visit (HOSPITAL_COMMUNITY)
Admission: RE | Admit: 2021-09-05 | Discharge: 2021-09-05 | Disposition: A | Discharge status: Home / Self Care | Payer: Medicaid Other | Source: Ambulatory Visit | Attending: Interventional Radiology | Admitting: Interventional Radiology

## 2021-09-05 ENCOUNTER — Encounter (HOSPITAL_COMMUNITY): Payer: Self-pay | Admitting: Interventional Radiology

## 2021-09-05 ENCOUNTER — Encounter (HOSPITAL_COMMUNITY)
Admission: RE | Disposition: A | Discharge status: Home / Self Care | Payer: Self-pay | Source: Home / Self Care | Attending: Interventional Radiology

## 2021-09-05 ENCOUNTER — Inpatient Hospital Stay (HOSPITAL_COMMUNITY): Payer: Medicaid Other | Admitting: Physician Assistant

## 2021-09-05 ENCOUNTER — Other Ambulatory Visit: Payer: Self-pay

## 2021-09-05 ENCOUNTER — Inpatient Hospital Stay (HOSPITAL_COMMUNITY): Payer: Medicaid Other | Admitting: Certified Registered Nurse Anesthetist

## 2021-09-05 ENCOUNTER — Inpatient Hospital Stay (HOSPITAL_COMMUNITY)
Admission: RE | Admit: 2021-09-05 | Discharge: 2021-09-07 | DRG: 407 | Disposition: A | Discharge status: Home / Self Care | Payer: Medicaid Other | Attending: Interventional Radiology | Admitting: Interventional Radiology

## 2021-09-05 DIAGNOSIS — R188 Other ascites: Secondary | ICD-10-CM | POA: Diagnosis not present

## 2021-09-05 DIAGNOSIS — K7290 Hepatic failure, unspecified without coma: Secondary | ICD-10-CM | POA: Insufficient documentation

## 2021-09-05 DIAGNOSIS — Z800 Family history of malignant neoplasm of digestive organs: Secondary | ICD-10-CM

## 2021-09-05 DIAGNOSIS — Z95828 Presence of other vascular implants and grafts: Secondary | ICD-10-CM

## 2021-09-05 DIAGNOSIS — Z841 Family history of disorders of kidney and ureter: Secondary | ICD-10-CM

## 2021-09-05 DIAGNOSIS — Z823 Family history of stroke: Secondary | ICD-10-CM

## 2021-09-05 DIAGNOSIS — N289 Disorder of kidney and ureter, unspecified: Secondary | ICD-10-CM | POA: Diagnosis not present

## 2021-09-05 DIAGNOSIS — J45909 Unspecified asthma, uncomplicated: Secondary | ICD-10-CM | POA: Diagnosis present

## 2021-09-05 DIAGNOSIS — M1990 Unspecified osteoarthritis, unspecified site: Secondary | ICD-10-CM | POA: Diagnosis present

## 2021-09-05 DIAGNOSIS — K279 Peptic ulcer, site unspecified, unspecified as acute or chronic, without hemorrhage or perforation: Secondary | ICD-10-CM

## 2021-09-05 DIAGNOSIS — K7460 Unspecified cirrhosis of liver: Secondary | ICD-10-CM | POA: Diagnosis not present

## 2021-09-05 DIAGNOSIS — K7031 Alcoholic cirrhosis of liver with ascites: Principal | ICD-10-CM

## 2021-09-05 DIAGNOSIS — Z20822 Contact with and (suspected) exposure to covid-19: Secondary | ICD-10-CM | POA: Diagnosis present

## 2021-09-05 DIAGNOSIS — Z79899 Other long term (current) drug therapy: Secondary | ICD-10-CM

## 2021-09-05 DIAGNOSIS — Z8249 Family history of ischemic heart disease and other diseases of the circulatory system: Secondary | ICD-10-CM

## 2021-09-05 HISTORY — PX: IR IVUS EACH ADDITIONAL NON CORONARY VESSEL: IMG6086

## 2021-09-05 HISTORY — PX: IR PARACENTESIS: IMG2679

## 2021-09-05 HISTORY — PX: RADIOLOGY WITH ANESTHESIA: SHX6223

## 2021-09-05 HISTORY — PX: IR US GUIDE VASC ACCESS RIGHT: IMG2390

## 2021-09-05 HISTORY — PX: IR TIPS: IMG2295

## 2021-09-05 LAB — PROTIME-INR
INR: 1.3 — ABNORMAL HIGH (ref 0.8–1.2)
Prothrombin Time: 15.7 seconds — ABNORMAL HIGH (ref 11.4–15.2)

## 2021-09-05 LAB — PREPARE RBC (CROSSMATCH)

## 2021-09-05 SURGERY — IR WITH ANESTHESIA
Anesthesia: General

## 2021-09-05 MED ORDER — SPIRONOLACTONE 25 MG PO TABS
25.0000 mg | ORAL_TABLET | Freq: Two times a day (BID) | ORAL | Status: DC
  Administered 2021-09-05 – 2021-09-07 (×4): 25 mg via ORAL
  Filled 2021-09-05 (×4): qty 1

## 2021-09-05 MED ORDER — DEXAMETHASONE SODIUM PHOSPHATE 10 MG/ML IJ SOLN
INTRAMUSCULAR | Status: DC | PRN
  Administered 2021-09-05: 10 mg via INTRAVENOUS

## 2021-09-05 MED ORDER — ALBUMIN HUMAN 5 % IV SOLN: INTRAVENOUS | Status: DC | PRN

## 2021-09-05 MED ORDER — FENTANYL CITRATE (PF) 100 MCG/2ML IJ SOLN
INTRAMUSCULAR | Status: DC | PRN
  Administered 2021-09-05: 50 ug via INTRAVENOUS

## 2021-09-05 MED ORDER — LIDOCAINE 2% (20 MG/ML) 5 ML SYRINGE
INTRAMUSCULAR | Status: DC | PRN
  Administered 2021-09-05: 60 mg via INTRAVENOUS

## 2021-09-05 MED ORDER — ROCURONIUM BROMIDE 10 MG/ML (PF) SYRINGE
PREFILLED_SYRINGE | INTRAVENOUS | Status: DC | PRN
  Administered 2021-09-05: 80 mg via INTRAVENOUS
  Administered 2021-09-05: 20 mg via INTRAVENOUS

## 2021-09-05 MED ORDER — ORAL CARE MOUTH RINSE: 15.0000 mL | Freq: Once | OROMUCOSAL | Status: AC

## 2021-09-05 MED ORDER — HYDRALAZINE HCL 20 MG/ML IJ SOLN: 10.0000 mg | Freq: Four times a day (QID) | INTRAMUSCULAR | Status: DC | PRN

## 2021-09-05 MED ORDER — FENTANYL CITRATE (PF) 100 MCG/2ML IJ SOLN: 25.0000 ug | INTRAMUSCULAR | Status: DC | PRN

## 2021-09-05 MED ORDER — LACTATED RINGERS IV SOLN: INTRAVENOUS | Status: DC | PRN

## 2021-09-05 MED ORDER — MIDODRINE HCL 5 MG PO TABS
10.0000 mg | ORAL_TABLET | Freq: Three times a day (TID) | ORAL | Status: DC
  Administered 2021-09-06 – 2021-09-07 (×5): 10 mg via ORAL
  Filled 2021-09-05 (×5): qty 2

## 2021-09-05 MED ORDER — SUGAMMADEX SODIUM 200 MG/2ML IV SOLN
INTRAVENOUS | Status: DC | PRN
  Administered 2021-09-05: 200 mg via INTRAVENOUS

## 2021-09-05 MED ORDER — PHENYLEPHRINE 80 MCG/ML (10ML) SYRINGE FOR IV PUSH (FOR BLOOD PRESSURE SUPPORT)
PREFILLED_SYRINGE | INTRAVENOUS | Status: DC | PRN
  Administered 2021-09-05 (×5): 80 ug via INTRAVENOUS

## 2021-09-05 MED ORDER — LACTULOSE 10 GM/15ML PO SOLN
20.0000 g | Freq: Three times a day (TID) | ORAL | Status: DC
  Administered 2021-09-05 – 2021-09-07 (×5): 20 g via ORAL
  Filled 2021-09-05 (×8): qty 30

## 2021-09-05 MED ORDER — LACTATED RINGERS IV SOLN: INTRAVENOUS | Status: DC

## 2021-09-05 MED ORDER — LEVOFLOXACIN IN D5W 500 MG/100ML IV SOLN
500.0000 mg | INTRAVENOUS | Status: AC
  Administered 2021-09-05: 500 mg via INTRAVENOUS
  Filled 2021-09-05: qty 100

## 2021-09-05 MED ORDER — LIDOCAINE HCL 1 % IJ SOLN
INTRAMUSCULAR | Status: AC
  Filled 2021-09-05: qty 20

## 2021-09-05 MED ORDER — OXYCODONE HCL 5 MG PO TABS
10.0000 mg | ORAL_TABLET | ORAL | Status: DC | PRN
  Administered 2021-09-07: 10 mg via ORAL
  Filled 2021-09-05: qty 2

## 2021-09-05 MED ORDER — ONDANSETRON HCL 4 MG/2ML IJ SOLN: 4.0000 mg | Freq: Four times a day (QID) | INTRAMUSCULAR | Status: DC | PRN

## 2021-09-05 MED ORDER — ACETAMINOPHEN 325 MG PO TABS: 650.0000 mg | ORAL_TABLET | Freq: Four times a day (QID) | ORAL | Status: DC | PRN

## 2021-09-05 MED ORDER — FUROSEMIDE 40 MG PO TABS
40.0000 mg | ORAL_TABLET | Freq: Every day | ORAL | Status: DC
  Administered 2021-09-06 – 2021-09-07 (×2): 40 mg via ORAL
  Filled 2021-09-05 (×2): qty 1

## 2021-09-05 MED ORDER — OXYCODONE HCL 5 MG PO TABS
5.0000 mg | ORAL_TABLET | ORAL | Status: DC | PRN
  Administered 2021-09-06: 5 mg via ORAL
  Filled 2021-09-05: qty 1

## 2021-09-05 MED ORDER — MIDAZOLAM HCL 2 MG/2ML IJ SOLN
INTRAMUSCULAR | Status: DC | PRN
  Administered 2021-09-05: 2 mg via INTRAVENOUS

## 2021-09-05 MED ORDER — CHLORHEXIDINE GLUCONATE 0.12 % MT SOLN
15.0000 mL | Freq: Once | OROMUCOSAL | Status: AC
  Administered 2021-09-05: 15 mL via OROMUCOSAL
  Filled 2021-09-05: qty 15

## 2021-09-05 MED ORDER — PROPOFOL 10 MG/ML IV BOLUS
INTRAVENOUS | Status: DC | PRN
  Administered 2021-09-05: 30 mg via INTRAVENOUS
  Administered 2021-09-05: 130 mg via INTRAVENOUS
  Administered 2021-09-05: 20 mg via INTRAVENOUS

## 2021-09-05 MED ORDER — ONDANSETRON HCL 4 MG/2ML IJ SOLN
INTRAMUSCULAR | Status: DC | PRN
  Administered 2021-09-05: 4 mg via INTRAVENOUS

## 2021-09-05 MED ORDER — PHENYLEPHRINE HCL-NACL 20-0.9 MG/250ML-% IV SOLN
INTRAVENOUS | Status: DC | PRN
  Administered 2021-09-05: 25 ug/min via INTRAVENOUS

## 2021-09-05 MED ORDER — PANTOPRAZOLE SODIUM 40 MG PO TBEC
40.0000 mg | DELAYED_RELEASE_TABLET | Freq: Two times a day (BID) | ORAL | Status: DC
  Administered 2021-09-05 – 2021-09-07 (×4): 40 mg via ORAL
  Filled 2021-09-05 (×4): qty 1

## 2021-09-05 MED ORDER — SODIUM CHLORIDE 0.9 % IV SOLN: INTRAVENOUS | Status: DC

## 2021-09-05 NOTE — Procedures (Signed)
Interventional Radiology Procedure Note  Procedure:  1) TIPS creation 2) Ultrasound guided paracentesis  Findings: Please refer to procedural dictation for full description. 7+2 cm Viatorr dilated to 8 mm, portosystemic gradient from 14 to 7.  7.4 L ascites drained during procedure  Complications: None immediate  Estimated Blood Loss: 10 mL  Recommendations: Admit to IR Head of bed up to 30 degrees for 2 hours Resume home meds Start lactulose TID tonight CBC, CMP, INR in the morning Hopeful discharge home tomorrow   Marliss Coots, MD Pager: [phone] Clinic: [phone]

## 2021-09-05 NOTE — Transfer of Care (Signed)
Immediate Anesthesia Transfer of Care Note  Patient: Kyle Pitts  Procedure(s) Performed: TIPS  Patient Location: PACU  Anesthesia Type:General  Level of Consciousness: patient cooperative and responds to stimulation  Airway & Oxygen Therapy: Patient Spontanous Breathing and Patient connected to nasal cannula oxygen  Post-op Assessment: Report given to RN, Post -op Vital signs reviewed and stable and Patient moving all extremities X 4  Post vital signs: Reviewed and stable  Last Vitals:  Vitals Value Taken Time  BP 97/56 09/05/21 1608  Temp    Pulse 73 09/05/21 1611  Resp 22 09/05/21 1611  SpO2 97 % 09/05/21 1611  Vitals shown include unvalidated device data.  Last Pain:  Vitals:   09/05/21 1130  TempSrc: Oral  PainSc: 0-No pain         Complications: No notable events documented.

## 2021-09-05 NOTE — Anesthesia Procedure Notes (Signed)
Procedure Name: Intubation Date/Time: 09/05/2021 12:45 PM Performed by: Harden Mo, CRNA Pre-anesthesia Checklist: Patient identified, Emergency Drugs available, Suction available and Patient being monitored Patient Re-evaluated:Patient Re-evaluated prior to induction Oxygen Delivery Method: Circle System Utilized Preoxygenation: Pre-oxygenation with 100% oxygen Induction Type: IV induction Ventilation: Mask ventilation without difficulty Laryngoscope Size: Miller, 2, Glidescope and 4 Grade View: Grade I Tube type: Oral Tube size: 7.5 mm Number of attempts: 1 Airway Equipment and Method: Stylet and Oral airway Placement Confirmation: ETT inserted through vocal cords under direct vision, positive ETCO2 and breath sounds checked- equal and bilateral Secured at: 23 cm Tube secured with: Tape Dental Injury: Teeth and Oropharynx as per pre-operative assessment

## 2021-09-05 NOTE — H&P (Signed)
Chief Complaint: Patient was seen in consultation today for Transjugular intrahepatic portal system shunt placement at the request of Dr Dayle Points  Supervising Physician: Ruthann Cancer  Patient Status: [facility] - Out-pt  History of Present Illness: Kyle Pitts is a 61 y.o. male   Hx alcoholic cirrhosis- Dx [id number] Recurrent ascites requiring serial paracentesis Last para: 08/29/21-- 6.8 L  Feels well today Denies pain; N/V Denies fever/chills No complaints   Consultation with Dr Serafina Royals [id number]:  Alcoholic cirrhosis, initially diagnosed in 2022.  He has undergone 4 paracenteses in the last month, and 6 over the last 2 months.  Frequent paracenteses have occurred since last November.  He states that right now he feels full and like he needs another as it limits his breathing and mobility.  He has no history of hepatic hydrothorax, and only a single episode of hepatic encephalopathy which has been well controlled on lactulose daily.  He has never been evaluated for liver transplant, but is not opposed.  No history of hepatocellular carcinoma or other malignancy.  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  He has no history of hematemesis or bloody stools.  He's currently taking furosemide 40 mg daily and spironolactone 100 mg daily, increased by Dr. Candis Schatz several months ago.  He states he has seen no difference, and maybe an increase, in his ascitic volume since increasing diuretics.  These have been challenging to manage given his poor renal function.  No history of cardiac dysfunction.  Child-Pugh = B points, class 8 MELD = 22 (19.6% estimated 3 month mortality) [place] Index of Post-TIPS Survival (FIPS) = 1.28 (Overall survival at 1 month 84.8%, at 3 months 59.6%, and at 6 months 47.1%)  Assessment and Plan: [name] is a 61 y.o. male with history of alcoholic cirrhosis and recurrent ascites  (Child Pugh B, MELD 22) with a degree of hepatorenal syndrome affecting maximization of diuresis.  Given that elevated MELD is primarily due to renal function, TIPS creation would likely benefit and reverse this process in addition to providing relief from ascites production. Risks and benefits of TIPS and/or additional variceal embolization were discussed with the patient including, but not limited to, infection, bleeding, damage to adjacent structures, worsening hepatic and/or cardiac function, worsening and/or the development of altered mental status/encephalopathy, non-target embolization and death.  Scheduled today for TIPS Pt is aware he will be admitted overnight for observation Plan for DC in am  Past Medical History:  Diagnosis Date   Allergic reaction 11/21/2020   Cirrhosis ([place])    Hyponatremia    Myofasciitis 11/21/2020   Osteomyelitis of right leg ([place]) 11/21/2020   Septic arthritis ([place])    Streptococcus infection, group A 11/21/2020    Past Surgical History:  Procedure Laterality Date   BIOPSY  02/15/2021   Procedure: BIOPSY;  Surgeon: Daryel November, MD;  Location[facility] ENDOSCOPY;  Service: Gastroenterology;;   ESOPHAGOGASTRODUODENOSCOPY N/A 02/15/2021   Procedure: ESOPHAGOGASTRODUODENOSCOPY (EGD);  Surgeon: Daryel November, MD;  Location: [place];  Service: Gastroenterology;  Laterality: N/A;   ESOPHAGOGASTRODUODENOSCOPY (EGD) WITH PROPOFOL N/A 03/19/2021   Procedure: ESOPHAGOGASTRODUODENOSCOPY (EGD) WITH PROPOFOL;  Surgeon: Sharyn Creamer, MD;  Location: [place];  Service: Gastroenterology;  Laterality: N/A;   HEMOSTASIS CLIP PLACEMENT  03/19/2021   Procedure: HEMOSTASIS CLIP PLACEMENT;  Surgeon: Sharyn Creamer, MD;  Location: [facility] ENDOSCOPY;  Service: Gastroenterology;;   I & D EXTREMITY Right 10/09/2020  Procedure: IRRIGATION AND DEBRIDEMENT EXTREMITY;  Surgeon: Nicholes Stairs, MD;  Location: [facility] ORS;  Service: Orthopedics;  Laterality: Right;    IR PARACENTESIS  03/18/2021   IR PARACENTESIS  04/09/2021   IR PARACENTESIS  04/22/2021   IR PARACENTESIS  05/02/2021   IR PARACENTESIS  05/16/2021   IR PARACENTESIS  05/29/2021   IR PARACENTESIS  06/13/2021   IR PARACENTESIS  06/25/2021   IR PARACENTESIS  07/04/2021   IR PARACENTESIS  07/16/2021   IR PARACENTESIS  07/25/2021   IR PARACENTESIS  07/31/2021   IR PARACENTESIS  08/06/2021   IR PARACENTESIS  08/14/2021   IR PARACENTESIS  08/21/2021   IR PARACENTESIS  08/29/2021   IR RADIOLOGIST EVAL & MGMT  08/11/2021   KNEE ARTHROSCOPY Right 09/27/2020   Procedure: ARTHROSCOPY KNEE, [place];  Surgeon: Melina Schools, MD;  Location: [facility] ORS;  Service: Orthopedics;  Laterality: Right;   KNEE ARTHROSCOPY Right 10/09/2020   Procedure: ARTHROSCOPY KNEE,ARTHROSCOPIC I & D, CALF ABSCESS;  Surgeon: Nicholes Stairs, MD;  Location: [facility]S;  Service: Orthopedics;  Laterality: Right;    Allergies: Latex, Tape, Amoxicillin, Cephalexin, and Penicillins  Medications: Prior to Admission medications   Medication Sig Start Date End Date Taking? Authorizing Provider  furosemide (LASIX) 40 MG tablet Take 1 tablet (40 mg total) by mouth daily. 05/14/21 05/09/22 Yes Daryel November, MD  lactulose, encephalopathy, (CHRONULAC) 10 GM/15ML SOLN Take 30 mLs (20 g total) by mouth 2 (two) times daily. 07/28/21  Yes Daryel November, MD  midodrine (PROAMATINE) 10 MG tablet Take 1 tablet (10 mg total) by mouth 3 (three) times daily with meals. 06/25/21  Yes Daryel November, MD  pantoprazole (PROTONIX) 40 MG tablet Take 1 tablet (40 mg total) by mouth 2 (two) times daily. 05/14/21 05/09/22 Yes Daryel November, MD  spironolactone (ALDACTONE) 25 MG tablet Take 2 tablets (50 mg total) by mouth 2 (two) times daily. Patient taking differently: Take 25 mg by mouth 2 (two) times daily. 06/25/21  Yes Daryel November, MD     Family History  Problem Relation Age of Onset   Hepatitis Mother    Cirrhosis Mother    Stroke Father     Heart failure Father    Colon cancer Father    Kidney failure Brother    Liver cancer Brother    Stomach cancer Neg Hx    Esophageal cancer Neg Hx     Social History   Socioeconomic History   Marital status: Divorced    Spouse name: Not on file   Number of children: 2   Years of education: Not on file   Highest education level: Not on file  Occupational History   Not on file  Tobacco Use   Smoking status: Never   Smokeless tobacco: Never  Vaping Use   Vaping Use: Never used  Substance and Sexual Activity   Alcohol use: Not Currently   Drug use: Not Currently   Sexual activity: Not Currently  Other Topics Concern   Not on file  Social History Narrative   Not on file   Social Determinants of Health   Financial Resource Strain: Not on file  Food Insecurity: Not on file  Transportation Needs: Not on file  Physical Activity: Not on file  Stress: Not on file  Social Connections: Not on file    Review of Systems: A 12 point ROS discussed and pertinent positives are indicated in the HPI above.  All other systems are  negative.  Review of Systems  Constitutional:  Negative for activity change, fatigue and fever.  Respiratory:  Negative for cough and shortness of breath.   Cardiovascular:  Negative for chest pain.  Gastrointestinal:  Positive for abdominal distention and abdominal pain. Negative for nausea and vomiting.  Musculoskeletal:  Negative for back pain.  Neurological:  Negative for weakness.  Psychiatric/Behavioral:  Negative for behavioral problems and confusion.    Vital Signs: BP 111/68   Pulse 70   Temp 98.6 F (37 C) (Oral)   Resp 16   Ht 6' (1.829 m)   Wt 190 lb (86.2 kg)   SpO2 98%   BMI 25.77 kg/m   Physical Exam Vitals reviewed.  HENT:     Mouth/Throat:     Mouth: Mucous membranes are moist.  Cardiovascular:     Rate and Rhythm: Normal rate and regular rhythm.     Heart sounds: Normal heart sounds.  Pulmonary:     Effort: Pulmonary  effort is normal.     Breath sounds: Normal breath sounds.  Abdominal:     General: There is distension.     Palpations: Abdomen is soft.     Tenderness: There is no abdominal tenderness.  Musculoskeletal:        General: Normal range of motion.     Cervical back: Normal range of motion.  Skin:    General: Skin is warm.  Neurological:     Mental Status: He is alert and oriented to person, place, and time.  Psychiatric:        Mood and Affect: Mood normal.        Behavior: Behavior normal.        Thought Content: Thought content normal.        Judgment: Judgment normal.    Imaging: US Paracentesis  Result Date: 08/13/2021 INDICATION: Patient with history of alcoholic cirrhosis and recurrent large volume ascites. Request for therapeutic paracentesis up to 4 L max. EXAM: ULTRASOUND GUIDED RIGHT LOWER QUADRANT PARACENTESIS MEDICATIONS: 1% plain lidocaine, 5 mL COMPLICATIONS: None immediate. PROCEDURE: Informed written consent was obtained from the patient after a discussion of the risks, benefits and alternatives to treatment. A timeout was performed prior to the initiation of the procedure. Initial ultrasound scanning demonstrates a large amount of ascites within the right lower abdominal quadrant. The right lower abdomen was prepped and draped in the usual sterile fashion. 1% lidocaine was used for local anesthesia. Following this, a 19 gauge, 7-cm, Yueh catheter was introduced. An ultrasound image was saved for documentation purposes. The paracentesis was performed. The catheter was removed and a dressing was applied. The patient tolerated the procedure well without immediate post procedural complication. FINDINGS: A total of approximately 4 L of clear yellow fluid was removed. IMPRESSION: Successful ultrasound-guided paracentesis yielding 4 liters of peritoneal fluid. Read by: Ascencion Dike PA-C PLAN: If the patient eventually requires >/=2 paracenteses in a 30 day period, candidacy for formal  evaluation by the [place] [facility] will be assessed. Electronically Signed   By: Sandi Mariscal M.D.   On: 08/13/2021 17:27   ECHOCARDIOGRAM COMPLETE  Result Date: 08/27/2021    ECHOCARDIOGRAM REPORT   Patient Name:   TAYVEON OHR Date of Exam: 08/27/2021 Medical Rec #:  [id number]       Height:       72.0 in Accession #:    [id number]      Weight:       209.8 lb Date of  Birth:  04/01/1961       BSA:          2.174 m Patient Age:    3 years        BP:           125/72 mmHg Patient Gender: M               HR:           71 bpm. Exam Location:  Outpatient Procedure: 2D Echo Indications:    pre-operative evaluation  History:        Patient has no prior history of Echocardiogram examinations.                 Cirrhosis.  Sonographer:    [place] Referring Phys: [id number] Stem  1. Left ventricular ejection fraction, by estimation, is 60 to 65%. The left ventricle has normal function. The left ventricle has no regional wall motion abnormalities. Left ventricular diastolic parameters are indeterminate.  2. Right ventricular systolic function is normal. The right ventricular size is normal.  3. Left atrial size was mildly dilated.  4. The mitral valve is normal in structure. Mild mitral valve regurgitation. No evidence of mitral stenosis.  5. Tricuspid valve regurgitation is moderate.  6. The aortic valve is normal in structure. Aortic valve regurgitation is not visualized. No aortic stenosis is present.  7. The inferior vena cava is normal in size with greater than 50% respiratory variability, suggesting right atrial pressure of 3 mmHg. FINDINGS  Left Ventricle: Left ventricular ejection fraction, by estimation, is 60 to 65%. The left ventricle has normal function. The left ventricle has no regional wall motion abnormalities. The left ventricular internal cavity size was normal in size. There is  no left ventricular hypertrophy. Left  ventricular diastolic parameters are indeterminate. Right Ventricle: The right ventricular size is normal. No increase in right ventricular wall thickness. Right ventricular systolic function is normal. Left Atrium: Left atrial size was mildly dilated. Right Atrium: Right atrial size was normal in size. Pericardium: There is no evidence of pericardial effusion. Mitral Valve: The mitral valve is normal in structure. Mild mitral valve regurgitation. No evidence of mitral valve stenosis. Tricuspid Valve: The tricuspid valve is normal in structure. Tricuspid valve regurgitation is moderate . No evidence of tricuspid stenosis. Aortic Valve: The aortic valve is normal in structure. Aortic valve regurgitation is not visualized. No aortic stenosis is present. Pulmonic Valve: The pulmonic valve was normal in structure. Pulmonic valve regurgitation is not visualized. No evidence of pulmonic stenosis. Aorta: The aortic root is normal in size and structure. Venous: The inferior vena cava is normal in size with greater than 50% respiratory variability, suggesting right atrial pressure of 3 mmHg. IAS/Shunts: No atrial level shunt detected by color flow Doppler.  LEFT VENTRICLE PLAX 2D LVIDd:         4.50 cm LVIDs:         2.80 cm LV PW:         0.80 cm LV IVS:        1.10 cm  RIGHT VENTRICLE             IVC RV S prime:     24.00 cm/s  IVC diam: 1.30 cm TAPSE (M-mode): 2.3 cm LEFT ATRIUM           Index        RIGHT ATRIUM           Index  LA diam:      4.40 cm 2.02 cm/m   RA Area:     13.30 cm LA Vol (A2C): 63.6 ml 29.25 ml/m  RA Volume:   28.40 ml  13.06 ml/m   AORTA Ao Asc diam: 3.40 cm MITRAL VALVE MV Area (PHT): 3.21 cm MV Decel Time: 236 msec MV E velocity: 81.20 cm/s MV A velocity: 69.20 cm/s MV E/A ratio:  1.17 Candee Furbish MD Electronically signed by Candee Furbish MD Signature Date/Time: 08/27/2021/4:31:36 PM    Final    IR Radiologist Eval & Mgmt  Result Date: 08/11/2021 Please refer to notes tab for details about  interventional procedure. (Op Note)  IR Paracentesis  Result Date: 08/29/2021 INDICATION: Patient history of alcoholic cirrhosis with recurrent large volume ascites. Request for diagnostic and therapeutic paracentesis. EXAM: ULTRASOUND GUIDED DIAGNOSTIC AND THERAPEUTIC PARACENTESIS MEDICATIONS: Lidocaine 1% 10 mL COMPLICATIONS: None immediate. PROCEDURE: Informed written consent was obtained from the patient after a discussion of the risks, benefits and alternatives to treatment. A timeout was performed prior to the initiation of the procedure. Initial ultrasound scanning demonstrates a large amount of ascites within the right lower abdominal quadrant. The right lower abdomen was prepped and draped in the usual sterile fashion. 1% lidocaine was used for local anesthesia. Following this, a 19 gauge, 7-cm, Yueh catheter was introduced. An ultrasound image was saved for documentation purposes. The paracentesis was performed. The catheter was removed and a dressing was applied. The patient tolerated the procedure well without immediate post procedural complication. Patient received post-procedure intravenous albumin; see nursing notes for details. FINDINGS: A total of approximately 6.8 L of clear yellow tinged fluid was removed. Samples were sent to the laboratory as requested by the clinical team. IMPRESSION: Successful ultrasound-guided therapeutic and diagnostic paracentesis yielding 6.8 liters of peritoneal fluid. Read by: Rushie Nyhan, NP PLAN: If the patient eventually requires >/=2 paracenteses in a 30 day period, candidacy for formal evaluation by the [place] [facility] will be assessed. Electronically Signed   By: Jacqulynn Cadet M.D.   On: 08/29/2021 17:04   IR Paracentesis  Result Date: 08/21/2021 INDICATION: Patient with history of alcoholic cirrhosis and recurrent large volume ascites. Request for diagnostic and therapeutic paracentesis up to 10 L  max EXAM: ULTRASOUND GUIDED RIGHT LOWER QUADRANT PARACENTESIS MEDICATIONS: 1% plain lidocaine, 5 mL COMPLICATIONS: None immediate. PROCEDURE: Informed written consent was obtained from the patient after a discussion of the risks, benefits and alternatives to treatment. A timeout was performed prior to the initiation of the procedure. Initial ultrasound scanning demonstrates a large amount of ascites within the right lower abdominal quadrant. The right lower abdomen was prepped and draped in the usual sterile fashion. 1% lidocaine was used for local anesthesia. Following this, a 19 gauge, 7-cm, Yueh catheter was introduced. An ultrasound image was saved for documentation purposes. The paracentesis was performed. The catheter was removed and a dressing was applied. The patient tolerated the procedure well without immediate post procedural complication. Patient received post-procedure intravenous albumin; see nursing notes for details. FINDINGS: A total of approximately 7.7 L of clear yellow fluid was removed. Samples were sent to the laboratory as requested by the clinical team. IMPRESSION: Successful ultrasound-guided paracentesis yielding 7.7 liters of peritoneal fluid. Read by: Ascencion Dike PA-C Electronically Signed   By: Jerilynn Mages.  Shick M.D.   On: 08/21/2021 16:23   IR Paracentesis  Result Date: 08/14/2021 INDICATION: Patient with history of alcoholic cirrhosis with recurrent large volume ascites. Request for  diagnostic and therapeutic paracentesis with a 10 L maximum. EXAM: ULTRASOUND GUIDED DIAGNOSTIC AND THERAPEUTIC RIGHT LOWER QUADRANT PARACENTESIS MEDICATIONS: 10 mL 1 % lidocaine COMPLICATIONS: None immediate. PROCEDURE: Informed written consent was obtained from the patient after a discussion of the risks, benefits and alternatives to treatment. A timeout was performed prior to the initiation of the procedure. Initial ultrasound scanning demonstrates a large amount of ascites within the right lower abdominal  quadrant. The right lower abdomen was prepped and draped in the usual sterile fashion. 1% lidocaine was used for local anesthesia. Following this, a 19 gauge, 7-cm, Yueh catheter was introduced. An ultrasound image was saved for documentation purposes. The paracentesis was performed. The catheter was removed and a dressing was applied. The patient tolerated the procedure well without immediate post procedural complication. Patient received post-procedure intravenous albumin; see nursing notes for details. FINDINGS: A total of approximately 7.3 L of clear, yellow fluid was removed. Samples were sent to the laboratory as requested by the clinical team. IMPRESSION: Successful ultrasound-guided paracentesis yielding 7.3 liters of peritoneal fluid. Read by: Narda Rutherford, AGNP-BC PLAN: If the patient eventually requires >/=2 paracenteses in a 30 day period, candidacy for formal evaluation by the [facility] Inter[facility]pertension [facility] will be assessed. Electronically Signed   By: Markus Daft M.D.   On: 08/14/2021 11:39   IR Paracentesis  Result Date: 08/06/2021 INDICATION: Patient with a history of alcoholic cirrhosis and recurrent large volume ascites presents today for a diagnostic and therapeutic paracentesis. 8 L max EXAM: ULTRASOUND GUIDED PARACENTESIS MEDICATIONS: 1% lidocaine 10 mL COMPLICATIONS: None immediate. PROCEDURE: Informed written consent was obtained from the patient after a discussion of the risks, benefits and alternatives to treatment. A timeout was performed prior to the initiation of the procedure. Initial ultrasound scanning demonstrates a large amount of ascites within the right lower abdominal quadrant. The right lower abdomen was prepped and draped in the usual sterile fashion. 1% lidocaine was used for local anesthesia. Following this, a 19 gauge, 7-cm, Yueh catheter was introduced. An ultrasound image was saved for documentation purposes. The paracentesis was performed.  The catheter was removed and a dressing was applied. The patient tolerated the procedure well without immediate post procedural complication. Patient received post-procedure intravenous albumin; see nursing notes for details. FINDINGS: A total of approximately 8 L of clear yellow fluid was removed. Samples were sent to the laboratory as requested by the clinical team. IMPRESSION: Successful ultrasound-guided paracentesis yielding 8 liters of peritoneal fluid. Read by: Soyla Dryer, NP PLAN: The patient has previously been formally evaluated by the [facility]pert[facility] and is being actively followed for potential future intervention. This patient is scheduled for clinic evaluation August 11, 2021. Electronically Signed   By: Lucrezia Europe M.D.   On: 08/06/2021 15:48    Labs:  CBC: Recent Labs    04/17/21 1708 05/14/21 1546 08/12/21 1424 09/01/21 1533  WBC 5.9 7.6 5.9 7.1  HGB 7.3* 8.5 Repeated and verified X2.* 9.3* 8.7*  HCT 21.7* 25.3 Repeated and verified X2.* 27.6* 26.8*  PLT 82* 152.0 180.0 140*    COAGS: Recent Labs    03/28/21 1848 04/04/21 1558 04/17/21 1708 05/14/21 1546 07/24/21 1547  INR 1.7* 1.5* 2.1* 1.8* 1.3*  APTT 42*  --  51*  --   --     BMP: Recent Labs    04/09/21 0149 04/10/21 0110 04/17/21 1708 05/14/21 1546 07/10/21 1407 07/24/21 1547 08/12/21 1424 09/01/21 1533  NA 135 136  133*   < > 126* 130* 132* 131*  K 3.4* 3.7 3.7   < > 3.6 3.8 3.4* 4.1  CL 107 110 106   < > 97 100 106 106  CO2 21* 21* 21*   < > 23 23 20  19*  GLUCOSE 109* 109* 108*   < > 115* 106* 112* 126*  BUN 9 7 18    < > 20 24* 20 24*  CALCIUM 8.5* 8.5* 8.7*   < > 8.2* 8.7 8.7 8.5*  CREATININE 1.16 1.09 1.29*   < > 2.02* 1.62* 1.69* 1.92*  GFRNONAA >60 >60 >60  --   --   --   --  40*   < > = values in this interval not displayed.    LIVER FUNCTION TESTS: Recent Labs    04/17/21 1708 05/14/21 1546 07/24/21 1547 09/01/21 1533  BILITOT 7.3*  4.6* 1.9* 1.1  AST 45* 45* 34 40  ALT 14 15 14 18   ALKPHOS 122 160* 123* 124  PROT 6.6 7.2 6.5 6.6  ALBUMIN 3.3* 2.9* 2.8* 2.8*    TUMOR MARKERS: No results for input(s): AFPTM, CEA, CA199, CHROMGRNA in the last 8760 hours.  Assessment and Plan:  Scheduled today for transjugular intrahepatic portal system shunt placement Risks and benefits of TIPS, BRTO and/or additional variceal embolization were discussed with the patient and/or the patient's family including, but not limited to, infection, bleeding, damage to adjacent structures, worsening hepatic and/or cardiac function, worsening and/or the development of altered mental status/encephalopathy, non-target embolization and death.   This interventional procedure involves the use of X-rays and because of the nature of the planned procedure, it is possible that we will have prolonged use of X-ray fluoroscopy.  Potential radiation risks to you include (but are not limited to) the following: - A slightly elevated risk for cancer  several years later in life. This risk is typically less than 0.5% percent. This risk is low in comparison to the normal incidence of human cancer, which is 33% for women and 50% for men according to the [employer]. - Radiation induced injury can include skin redness, resembling a rash, tissue breakdown / ulcers and hair loss (which can be temporary or permanent).   The likelihood of either of these occurring depends on the difficulty of the procedure and whether you are sensitive to radiation due to previous procedures, disease, or genetic conditions.   IF your procedure requires a prolonged use of radiation, you will be notified and given written instructions for further action.  It is your responsibility to monitor the irradiated area for the 2 weeks following the procedure and to notify your physician if you are concerned that you have suffered a radiation induced injury.    All of the patient's  questions were answered, patient is agreeable to proceed.  Consent signed and in chart.   Thank you for this interesting consult.  I greatly enjoyed meeting ZAMAN RINGOLD and look forward to participating in their care.  A copy of this report was sent to the requesting provider on this date.  Electronically Signed: Lavonia Drafts, PA-C 09/05/2021, 11:35 AM   I spent a total of    25 Minutes in face to face in clinical consultation, greater than 50% of which was counseling/coordinating care for TIPS

## 2021-09-05 NOTE — Anesthesia Procedure Notes (Signed)
Arterial Line Insertion Start/End5/19/2023 12:00 PM, 09/05/2021 12:05 PM Performed by: Lonia Mad, CRNA, CRNA  Patient location: Pre-op. Preanesthetic checklist: patient identified, IV checked, site marked, risks and benefits discussed, surgical consent, monitors and equipment checked, pre-op evaluation and anesthesia consent Lidocaine 1% used for infiltration Left, radial was placed Catheter size: 20 G Hand hygiene performed  and maximum sterile barriers used   Attempts: 1 Procedure performed without using ultrasound guided technique. Ultrasound Notes:anatomy identified, needle tip was noted to be adjacent to the nerve/plexus identified and no ultrasound evidence of intravascular and/or intraneural injection Following insertion, dressing applied and Biopatch. Post procedure assessment: normal and unchanged  Patient tolerated the procedure well with no immediate complications.

## 2021-09-05 NOTE — Anesthesia Preprocedure Evaluation (Addendum)
Anesthesia Evaluation  Patient identified by MRN, date of birth, ID band Patient awake    Reviewed: Allergy & Precautions, NPO status , Patient's Chart, lab work & pertinent test results  Airway Mallampati: I  TM Distance: >3 FB Neck ROM: Full    Dental  (+) Missing, Dental Advisory Given, Poor Dentition,    Pulmonary asthma ,    Pulmonary exam normal breath sounds clear to auscultation       Cardiovascular Normal cardiovascular exam Rhythm:Regular Rate:Normal  TTE 2023 1. Left ventricular ejection fraction, by estimation, is 60 to 65%. The  left ventricle has normal function. The left ventricle has no regional  wall motion abnormalities. Left ventricular diastolic parameters are  indeterminate.  2. Right ventricular systolic function is normal. The right ventricular  size is normal.  3. Left atrial size was mildly dilated.  4. The mitral valve is normal in structure. Mild mitral valve  regurgitation. No evidence of mitral stenosis.  5. Tricuspid valve regurgitation is moderate.  6. The aortic valve is normal in structure. Aortic valve regurgitation is  not visualized. No aortic stenosis is present.  7. The inferior vena cava is normal in size with greater than 50%  respiratory variability, suggesting right atrial pressure of 3 mmHg.    Neuro/Psych negative neurological ROS  negative psych ROS   GI/Hepatic PUD, (+) Cirrhosis   ascites  substance abuse  alcohol use,   Endo/Other  negative endocrine ROS  Renal/GU Renal InsufficiencyRenal diseaseLab Results      Component                Value               Date                      CREATININE               1.92 (H)            09/01/2021                BUN                      24 (H)              09/01/2021                NA                       131 (L)             09/01/2021                K                        4.1                 09/01/2021                CL                        106                 09/01/2021                CO2  19 (L)              09/01/2021             negative genitourinary   Musculoskeletal  (+) Arthritis ,   Abdominal   Peds  Hematology  (+) Blood dyscrasia (Hgb 8.7, plt 140), anemia ,   Anesthesia Other Findings   Reproductive/Obstetrics                            Anesthesia Physical Anesthesia Plan  ASA: 3  Anesthesia Plan: General   Post-op Pain Management: Tylenol PO (pre-op)* and Minimal or no pain anticipated   Induction: Intravenous  PONV Risk Score and Plan: 2 and Midazolam, Dexamethasone and Ondansetron  Airway Management Planned: Oral ETT  Additional Equipment: Arterial line  Intra-op Plan:   Post-operative Plan: Extubation in OR  Informed Consent: I have reviewed the patients History and Physical, chart, labs and discussed the procedure including the risks, benefits and alternatives for the proposed anesthesia with the patient or authorized representative who has indicated his/her understanding and acceptance.     Dental advisory given  Plan Discussed with: CRNA  Anesthesia Plan Comments:         Anesthesia Quick Evaluation

## 2021-09-05 NOTE — Sedation Documentation (Signed)
Pre RA 13, PV 27, Gradient 14 Post tips pre angioplasty RA 16, PV 27, Gradient 11 Post tips post angioplasty RA 16, PV 23, Gradient 7

## 2021-09-06 DIAGNOSIS — Z79899 Other long term (current) drug therapy: Secondary | ICD-10-CM | POA: Diagnosis not present

## 2021-09-06 DIAGNOSIS — Z800 Family history of malignant neoplasm of digestive organs: Secondary | ICD-10-CM | POA: Diagnosis not present

## 2021-09-06 DIAGNOSIS — Z841 Family history of disorders of kidney and ureter: Secondary | ICD-10-CM | POA: Diagnosis not present

## 2021-09-06 DIAGNOSIS — Z20822 Contact with and (suspected) exposure to covid-19: Secondary | ICD-10-CM | POA: Diagnosis present

## 2021-09-06 DIAGNOSIS — K7031 Alcoholic cirrhosis of liver with ascites: Secondary | ICD-10-CM | POA: Diagnosis present

## 2021-09-06 DIAGNOSIS — J45909 Unspecified asthma, uncomplicated: Secondary | ICD-10-CM | POA: Diagnosis present

## 2021-09-06 DIAGNOSIS — Z823 Family history of stroke: Secondary | ICD-10-CM | POA: Diagnosis not present

## 2021-09-06 DIAGNOSIS — M1990 Unspecified osteoarthritis, unspecified site: Secondary | ICD-10-CM | POA: Diagnosis present

## 2021-09-06 DIAGNOSIS — Z8249 Family history of ischemic heart disease and other diseases of the circulatory system: Secondary | ICD-10-CM | POA: Diagnosis not present

## 2021-09-06 LAB — POCT I-STAT 7, (LYTES, BLD GAS, ICA,H+H)
Acid-base deficit: 6 mmol/L — ABNORMAL HIGH (ref 0.0–2.0)
Bicarbonate: 20.7 mmol/L (ref 20.0–28.0)
Calcium, Ion: 1.22 mmol/L (ref 1.15–1.40)
HCT: 26 % — ABNORMAL LOW (ref 39.0–52.0)
Hemoglobin: 8.8 g/dL — ABNORMAL LOW (ref 13.0–17.0)
O2 Saturation: 99 %
Patient temperature: 35.6
Potassium: 3.9 mmol/L (ref 3.5–5.1)
Sodium: 133 mmol/L — ABNORMAL LOW (ref 135–145)
TCO2: 22 mmol/L (ref 22–32)
pCO2 arterial: 45 mmHg (ref 32–48)
pH, Arterial: 7.263 — ABNORMAL LOW (ref 7.35–7.45)
pO2, Arterial: 144 mmHg — ABNORMAL HIGH (ref 83–108)

## 2021-09-06 LAB — COMPREHENSIVE METABOLIC PANEL
ALT: 25 U/L (ref 0–44)
AST: 67 U/L — ABNORMAL HIGH (ref 15–41)
Albumin: 2.6 g/dL — ABNORMAL LOW (ref 3.5–5.0)
Alkaline Phosphatase: 73 U/L (ref 38–126)
Anion gap: 7 (ref 5–15)
BUN: 25 mg/dL — ABNORMAL HIGH (ref 6–20)
CO2: 19 mmol/L — ABNORMAL LOW (ref 22–32)
Calcium: 8.5 mg/dL — ABNORMAL LOW (ref 8.9–10.3)
Chloride: 104 mmol/L (ref 98–111)
Creatinine, Ser: 1.82 mg/dL — ABNORMAL HIGH (ref 0.61–1.24)
GFR, Estimated: 42 mL/min — ABNORMAL LOW (ref >60)
Glucose, Bld: 148 mg/dL — ABNORMAL HIGH (ref 70–99)
Potassium: 3.9 mmol/L (ref 3.5–5.1)
Sodium: 130 mmol/L — ABNORMAL LOW (ref 135–145)
Total Bilirubin: 1.6 mg/dL — ABNORMAL HIGH (ref 0.3–1.2)
Total Protein: 5.6 g/dL — ABNORMAL LOW (ref 6.5–8.1)

## 2021-09-06 LAB — CBC
HCT: 21.8 % — ABNORMAL LOW (ref 39.0–52.0)
Hemoglobin: 7.4 g/dL — ABNORMAL LOW (ref 13.0–17.0)
MCH: 33.2 pg (ref 26.0–34.0)
MCHC: 33.9 g/dL (ref 30.0–36.0)
MCV: 97.8 fL (ref 80.0–100.0)
Platelets: 121 10*3/uL — ABNORMAL LOW (ref 150–400)
RBC: 2.23 MIL/uL — ABNORMAL LOW (ref 4.22–5.81)
RDW: 15.6 % — ABNORMAL HIGH (ref 11.5–15.5)
WBC: 6.4 10*3/uL (ref 4.0–10.5)
nRBC: 0 % (ref 0.0–0.2)

## 2021-09-06 LAB — PROTIME-INR
INR: 1.7 — ABNORMAL HIGH (ref 0.8–1.2)
Prothrombin Time: 20.1 seconds — ABNORMAL HIGH (ref 11.4–15.2)

## 2021-09-06 NOTE — Progress Notes (Addendum)
Patient ID: Kyle Pitts, male   DOB: 1960-05-03, 61 y.o.   MRN: [id number]    Referring Physician(s): Cunningham,S  Supervising Physician: Marliss Coots  Patient Status:  [facility] - In-pt  Chief Complaint:  Alcoholic cirrhosis, recurrent ascites  Subjective: Patient doing fairly well today.  Does have some mild right upper quadrant and right lateral abdominal discomfort.  Denies fever, headache, chest pain, worsening dyspnea, back pain, nausea, vomiting or bleeding.   Allergies: Latex, Tape, Amoxicillin, Cephalexin, and Penicillins  Medications: Prior to Admission medications   Medication Sig Start Date End Date Taking? Authorizing Provider  furosemide (LASIX) 40 MG tablet Take 1 tablet (40 mg total) by mouth daily. 05/14/21 05/09/22 Yes Jenel Lucks, MD  lactulose, encephalopathy, (CHRONULAC) 10 GM/15ML SOLN Take 30 mLs (20 g total) by mouth 2 (two) times daily. 07/28/21  Yes Jenel Lucks, MD  midodrine (PROAMATINE) 10 MG tablet Take 1 tablet (10 mg total) by mouth 3 (three) times daily with meals. 06/25/21  Yes Jenel Lucks, MD  pantoprazole (PROTONIX) 40 MG tablet Take 1 tablet (40 mg total) by mouth 2 (two) times daily. 05/14/21 05/09/22 Yes Jenel Lucks, MD  spironolactone (ALDACTONE) 25 MG tablet Take 2 tablets (50 mg total) by mouth 2 (two) times daily. Patient taking differently: Take 25 mg by mouth 2 (two) times daily. 06/25/21  Yes Jenel Lucks, MD     Vital Signs: BP 105/65 (BP Location: Left Arm)   Pulse 72   Temp 98.8 F (37.1 C) (Oral)   Resp 17   Ht 6' (1.829 m)   Wt 190 lb (86.2 kg)   SpO2 95%   BMI 25.77 kg/m   Physical Exam awake, alert.  Chest with sl dim BS bases;  Heart with regular rate and rhythm.  Abdomen slightly distended, soft, positive bowel sounds, some mild tenderness to right upper and lateral portions.  Trace pretibial edema bilaterally.  Puncture sites right IJ and right common femoral vein soft, clean, dry,  nontender.  No hematoma  Imaging: IR Tips  Result Date: 09/05/2021 CLINICAL DATA:  Kyle Pitts is a 61 y.o. male with history of alcoholic cirrhosis and recurrent ascites (Child Pugh B, MELD 22) with a degree of hepatorenal syndrome affecting maximization of diuresis. Given that elevated MELD is primarily due to renal function, TIPS creation would likely benefit and reverse this process in addition to providing relief from ascites production. EXAM: 1. Ultrasound-guided paracentesis 2. Ultrasound-guided access of the right internal jugular vein 3. Ultrasound-guided access of the right common femoral vein 4. Hepatic venogram 5. Intravascular ultrasound 6. Catheterization of the portal vein 7. Portal venous and central manometry 8. Portal venogram 9. Creation of a transhepatic portal vein to hepatic vein shunt MEDICATIONS: As antibiotic prophylaxis, Levaquin 500 mg IV was ordered pre-procedure and administered intravenously within one hour of incision. ANESTHESIA/SEDATION: General - as administered by the Anesthesia department CONTRAST:  100 ML Omnipaque 300, intravenous FLUOROSCOPY TIME:  480 mGy COMPLICATIONS: None immediate. PROCEDURE: Informed written consent was obtained from the patient after a thorough discussion of the procedural risks, benefits and alternatives. All questions were addressed. Maximal Sterile Barrier Technique was utilized including caps, mask, sterile gowns, sterile gloves, sterile drape, hand hygiene and skin antiseptic. A timeout was performed prior to the initiation of the procedure. A preliminary ultrasound of the right groin was performed and demonstrates a patent right common femoral vein. A permanent ultrasound image was recorded. Using a combination of fluoroscopy and ultrasound,  an access site was determined. A small dermatotomy was made at the planned puncture site. Using ultrasound guidance, access into the right common femoral vein was obtained with visualization of needle  entry into the vessel using a standard micropuncture technique. A wire was advanced into the IVC insert all fascial dilation performed. An 8 [place], 11 cm vascular sheath was placed into the external iliac vein. Through this access site, an 78 [place] ICE catheter was advanced with ease under fluoroscopic guidance to the level of the intrahepatic inferior vena cava. A preliminary ultrasound of the right neck was performed and demonstrates a patent internal jugular vein. A permanent ultrasound image was recorded. Using a combination of fluoroscopy and ultrasound, an access site was determined. A small dermatotomy was made at the planned puncture site. Using ultrasound guidance, access into the right internal jugular vein was obtained with visualization of needle entry into the vessel using a standard micropuncture technique. A wire was advanced into the IVC and serial fascial dilation performed. A 9 French tips sheath was placed into the internal jugular vein and advanced to the IVC. The jugular sheath was retracted into the right atrium and manometry was performed. A 5 French angled tip catheter was then directed into the right hepatic vein. Hepatic venogram was performed. These images demonstrated patent hepatic vein with no stenosis. The catheter was advanced to a wedge portion of the a patent vein over which the 9 French sheath was advanced into the right hepatic vein. Using ICE ultrasound visualization the catheter and right hepatic vein as well as the portal anatomy was defined. A planned exit site from the hepatic vein and puncture site from the portal vein was placed into a single sonographic plane. Under direct ultrasound visualization, the score pNX needle was attempted to be advanced into the central right portal vein, without success. Therefore, using ICE ultrasound visualization the catheter was redirected into the middle hepatic vein. A planned exit site from the hepatic vein and puncture site  from the portal vein was placed into a single sonographic plane. Under direct ultrasound visualization, the score pNX needle was advanced into the central right portal vein. Hand injection of contrast confirmed position within the portal system. A Glidewire Advantage was then advanced into the superior mesenteric vein. A 5 French marking pigtail catheter was then advanced over the wire into the main portal vein and wire removed. Portal venogram was performed which demonstrated a patent portal vein. 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 Portal manometry was then performed. The indwelling 9 French sheath was then exchanged over the wire for a 10 French, 45 cm dry seal sheath for anticipated endograft deployment. The tract was then dilated to 8 mm with an 8 mm x 100 mm Athletis balloon. A 8-10 mm by 7 + 2 mm of Viatorr was placed. This was ultimately dilated to 8 mm. After placement of the shunt, right atrial and portal pressures were repeated. Completion portal venogram demonstrates a patent TIPS endograft without significant gastroesophageal varices. The catheters and sheath were removed and manual compression was applied to the right internal jugular and right common femoral venous access sites until hemostasis was achieved. The patient was transferred to the PACU in stable condition. Pre-TIPS Mean Pressures (mmHg): Right atrium: 13 Portal vein: 27 Portosystemic gradient: 14 Post-TIPS Mean Pressures (mmHg): Right atrium:16 Portal vein: 23 Portosystemic gradient: 7 IMPRESSION: 1. Successful transjugular portosystemic shunt creation. 2. Portosystemic gradient of 14 mm Hg (  absolute portal venous pressure 27 mm Hg) before shunt placement and 7 mm Hg (absolute portal venous pressure 23 mm Hg) after shunt placement. PLAN: Interventional Radiology Portal Hypertension Clinic will follow up closely with patient after discharge. Plan for 1 month in person clinic visit. Marliss Coots, MD [facility] Special[facility] Radiology Electronically Signed   By: Marliss Coots M.D.   On: 09/05/2021 16:38   IR US Guide Vasc Access Right  Result Date: 09/05/2021 CLINICAL DATA:  CHEY CHO is a 61 y.o. male with history of alcoholic cirrhosis and recurrent ascites (Child Pugh B, MELD 22) with a degree of hepatorenal syndrome affecting maximization of diuresis. Given that elevated MELD is primarily due to renal function, TIPS creation would likely benefit and reverse this process in addition to providing relief from ascites production. EXAM: 1. Ultrasound-guided paracentesis 2. Ultrasound-guided access of the right internal jugular vein 3. Ultrasound-guided access of the right common femoral vein 4. Hepatic venogram 5. Intravascular ultrasound 6. Catheterization of the portal vein 7. Portal venous and central manometry 8. Portal venogram 9. Creation of a transhepatic portal vein to hepatic vein shunt MEDICATIONS: As antibiotic prophylaxis, Levaquin 500 mg IV was ordered pre-procedure and administered intravenously within one hour of incision. ANESTHESIA/SEDATION: General - as administered by the Anesthesia department CONTRAST:  100 ML Omnipaque 300, intravenous FLUOROSCOPY TIME:  480 mGy COMPLICATIONS: None immediate. PROCEDURE: Informed written consent was obtained from the patient after a thorough discussion of the procedural risks, benefits and alternatives. All questions were addressed. Maximal Sterile Barrier Technique was utilized including caps, mask, sterile gowns, sterile gloves, sterile drape, hand hygiene and skin antiseptic. A timeout was performed prior to the initiation of the procedure. A preliminary ultrasound of the right groin was performed and demonstrates a patent right common femoral vein. A permanent ultrasound image was recorded. Using a combination of fluoroscopy and ultrasound, an access site was determined. A small dermatotomy was made at the planned  puncture site. Using ultrasound guidance, access into the right common femoral vein was obtained with visualization of needle entry into the vessel using a standard micropuncture technique. A wire was advanced into the IVC insert all fascial dilation performed. An 8 [place], 11 cm vascular sheath was placed into the external iliac vein. Through this access site, an 57 [place] ICE catheter was advanced with ease under fluoroscopic guidance to the level of the intrahepatic inferior vena cava. A preliminary ultrasound of the right neck was performed and demonstrates a patent internal jugular vein. A permanent ultrasound image was recorded. Using a combination of fluoroscopy and ultrasound, an access site was determined. A small dermatotomy was made at the planned puncture site. Using ultrasound guidance, access into the right internal jugular vein was obtained with visualization of needle entry into the vessel using a standard micropuncture technique. A wire was advanced into the IVC and serial fascial dilation performed. A 9 French tips sheath was placed into the internal jugular vein and advanced to the IVC. The jugular sheath was retracted into the right atrium and manometry was performed. A 5 French angled tip catheter was then directed into the right hepatic vein. Hepatic venogram was performed. These images demonstrated patent hepatic vein with no stenosis. The catheter was advanced to a wedge portion of the a patent vein over which the 9 French sheath was advanced into the right hepatic vein. Using ICE ultrasound visualization the catheter and right hepatic vein as well as the portal anatomy was  defined. A planned exit site from the hepatic vein and puncture site from the portal vein was placed into a single sonographic plane. Under direct ultrasound visualization, the score pNX needle was attempted to be advanced into the central right portal vein, without success. Therefore, using ICE ultrasound  visualization the catheter was redirected into the middle hepatic vein. A planned exit site from the hepatic vein and puncture site from the portal vein was placed into a single sonographic plane. Under direct ultrasound visualization, the score pNX needle was advanced into the central right portal vein. Hand injection of contrast confirmed position within the portal system. A Glidewire Advantage was then advanced into the superior mesenteric vein. A 5 French marking pigtail catheter was then advanced over the wire into the main portal vein and wire removed. Portal venogram was performed which demonstrated a patent portal vein. 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 Portal manometry was then performed. The indwelling 9 French sheath was then exchanged over the wire for a 10 French, 45 cm dry seal sheath for anticipated endograft deployment. The tract was then dilated to 8 mm with an 8 mm x 100 mm Athletis balloon. A 8-10 mm by 7 + 2 mm of Viatorr was placed. This was ultimately dilated to 8 mm. After placement of the shunt, right atrial and portal pressures were repeated. Completion portal venogram demonstrates a patent TIPS endograft without significant gastroesophageal varices. The catheters and sheath were removed and manual compression was applied to the right internal jugular and right common femoral venous access sites until hemostasis was achieved. The patient was transferred to the PACU in stable condition. Pre-TIPS Mean Pressures (mmHg): Right atrium: 13 Portal vein: 27 Portosystemic gradient: 14 Post-TIPS Mean Pressures (mmHg): Right atrium:16 Portal vein: 23 Portosystemic gradient: 7 IMPRESSION: 1. Successful transjugular portosystemic shunt creation. 2. Portosystemic gradient of 14 mm Hg (absolute portal venous pressure 27 mm Hg) before shunt placement and 7 mm Hg (absolute portal venous pressure 23 mm Hg) after shunt placement. PLAN: Interventional Radiology Portal  Hypertension Clinic will follow up closely with patient after discharge. Plan for 1 month in person clinic visit. Marliss Cootsylan Suttle, MD [facility]iology Special[facility] Electronically Signed   By: Marliss Cootsylan  Suttle M.D.   On: 09/05/2021 16:38   IR IVUS EACH ADDITIONAL NON CORONARY VESSEL  Result Date: 09/05/2021 CLINICAL DATA:  Donivan Scullerry A Birchler is a 61 y.o. male with history of alcoholic cirrhosis and recurrent ascites (Child Pugh B, MELD 22) with a degree of hepatorenal syndrome affecting maximization of diuresis. Given that elevated MELD is primarily due to renal function, TIPS creation would likely benefit and reverse this process in addition to providing relief from ascites production. EXAM: 1. Ultrasound-guided paracentesis 2. Ultrasound-guided access of the right internal jugular vein 3. Ultrasound-guided access of the right common femoral vein 4. Hepatic venogram 5. Intravascular ultrasound 6. Catheterization of the portal vein 7. Portal venous and central manometry 8. Portal venogram 9. Creation of a transhepatic portal vein to hepatic vein shunt MEDICATIONS: As antibiotic prophylaxis, Levaquin 500 mg IV was ordered pre-procedure and administered intravenously within one hour of incision. ANESTHESIA/SEDATION: General - as administered by the Anesthesia department CONTRAST:  100 ML Omnipaque 300, intravenous FLUOROSCOPY TIME:  480 mGy COMPLICATIONS: None immediate. PROCEDURE: Informed written consent was obtained from the patient after a thorough discussion of the procedural risks, benefits and alternatives. All questions were addressed. Maximal Sterile Barrier Technique was utilized including caps,  mask, sterile gowns, sterile gloves, sterile drape, hand hygiene and skin antiseptic. A timeout was performed prior to the initiation of the procedure. A preliminary ultrasound of the right groin was performed and demonstrates a patent right common femoral vein. A permanent  ultrasound image was recorded. Using a combination of fluoroscopy and ultrasound, an access site was determined. A small dermatotomy was made at the planned puncture site. Using ultrasound guidance, access into the right common femoral vein was obtained with visualization of needle entry into the vessel using a standard micropuncture technique. A wire was advanced into the IVC insert all fascial dilation performed. An 8 [place], 11 cm vascular sheath was placed into the external iliac vein. Through this access site, an 15 [place] ICE catheter was advanced with ease under fluoroscopic guidance to the level of the intrahepatic inferior vena cava. A preliminary ultrasound of the right neck was performed and demonstrates a patent internal jugular vein. A permanent ultrasound image was recorded. Using a combination of fluoroscopy and ultrasound, an access site was determined. A small dermatotomy was made at the planned puncture site. Using ultrasound guidance, access into the right internal jugular vein was obtained with visualization of needle entry into the vessel using a standard micropuncture technique. A wire was advanced into the IVC and serial fascial dilation performed. A 9 French tips sheath was placed into the internal jugular vein and advanced to the IVC. The jugular sheath was retracted into the right atrium and manometry was performed. A 5 French angled tip catheter was then directed into the right hepatic vein. Hepatic venogram was performed. These images demonstrated patent hepatic vein with no stenosis. The catheter was advanced to a wedge portion of the a patent vein over which the 9 French sheath was advanced into the right hepatic vein. Using ICE ultrasound visualization the catheter and right hepatic vein as well as the portal anatomy was defined. A planned exit site from the hepatic vein and puncture site from the portal vein was placed into a single sonographic plane. Under direct ultrasound  visualization, the score pNX needle was attempted to be advanced into the central right portal vein, without success. Therefore, using ICE ultrasound visualization the catheter was redirected into the middle hepatic vein. A planned exit site from the hepatic vein and puncture site from the portal vein was placed into a single sonographic plane. Under direct ultrasound visualization, the score pNX needle was advanced into the central right portal vein. Hand injection of contrast confirmed position within the portal system. A Glidewire Advantage was then advanced into the superior mesenteric vein. A 5 French marking pigtail catheter was then advanced over the wire into the main portal vein and wire removed. Portal venogram was performed which demonstrated a patent portal vein. 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 Portal manometry was then performed. The indwelling 9 French sheath was then exchanged over the wire for a 10 French, 45 cm dry seal sheath for anticipated endograft deployment. The tract was then dilated to 8 mm with an 8 mm x 100 mm Athletis balloon. A 8-10 mm by 7 + 2 mm of Viatorr was placed. This was ultimately dilated to 8 mm. After placement of the shunt, right atrial and portal pressures were repeated. Completion portal venogram demonstrates a patent TIPS endograft without significant gastroesophageal varices. The catheters and sheath were removed and manual compression was applied to the right internal jugular and right common femoral venous access sites  until hemostasis was achieved. The patient was transferred to the PACU in stable condition. Pre-TIPS Mean Pressures (mmHg): Right atrium: 13 Portal vein: 27 Portosystemic gradient: 14 Post-TIPS Mean Pressures (mmHg): Right atrium:16 Portal vein: 23 Portosystemic gradient: 7 IMPRESSION: 1. Successful transjugular portosystemic shunt creation. 2. Portosystemic gradient of 14 mm Hg (absolute portal venous pressure 27 mm  Hg) before shunt placement and 7 mm Hg (absolute portal venous pressure 23 mm Hg) after shunt placement. PLAN: Interventional Radiology Portal Hypertension Clinic will follow up closely with patient after discharge. Plan for 1 month in person clinic visit. Marliss Coots, MD Vascular and Interventional Radiology Specialists [facility] Radiology Electronically Signed   By: Marliss Coots M.D.   On: 09/05/2021 16:38   IR Paracentesis  Result Date: 09/05/2021 CLINICAL DATA:  ADAMA FERBER is a 61 y.o. male with history of alcoholic cirrhosis and recurrent ascites (Child Pugh B, MELD 22) with a degree of hepatorenal syndrome affecting maximization of diuresis. Given that elevated MELD is primarily due to renal function, TIPS creation would likely benefit and reverse this process in addition to providing relief from ascites production. EXAM: 1. Ultrasound-guided paracentesis 2. Ultrasound-guided access of the right internal jugular vein 3. Ultrasound-guided access of the right common femoral vein 4. Hepatic venogram 5. Intravascular ultrasound 6. Catheterization of the portal vein 7. Portal venous and central manometry 8. Portal venogram 9. Creation of a transhepatic portal vein to hepatic vein shunt MEDICATIONS: As antibiotic prophylaxis, Levaquin 500 mg IV was ordered pre-procedure and administered intravenously within one hour of incision. ANESTHESIA/SEDATION: General - as administered by the Anesthesia department CONTRAST:  100 ML Omnipaque 300, intravenous FLUOROSCOPY TIME:  480 mGy COMPLICATIONS: None immediate. PROCEDURE: Informed written consent was obtained from the patient after a thorough discussion of the procedural risks, benefits and alternatives. All questions were addressed. Maximal Sterile Barrier Technique was utilized including caps, mask, sterile gowns, sterile gloves, sterile drape, hand hygiene and skin antiseptic. A timeout was performed prior to the initiation of the procedure. A preliminary  ultrasound of the right groin was performed and demonstrates a patent right common femoral vein. A permanent ultrasound image was recorded. Using a combination of fluoroscopy and ultrasound, an access site was determined. A small dermatotomy was made at the planned puncture site. Using ultrasound guidance, access into the right common femoral vein was obtained with visualization of needle entry into the vessel using a standard micropuncture technique. A wire was advanced into the IVC insert all fascial dilation performed. An 8 [place], 11 cm vascular sheath was placed into the external iliac vein. Through this access site, an 61 [place] ICE catheter was advanced with ease under fluoroscopic guidance to the level of the intrahepatic inferior vena cava. A preliminary ultrasound of the right neck was performed and demonstrates a patent internal jugular vein. A permanent ultrasound image was recorded. Using a combination of fluoroscopy and ultrasound, an access site was determined. A small dermatotomy was made at the planned puncture site. Using ultrasound guidance, access into the right internal jugular vein was obtained with visualization of needle entry into the vessel using a standard micropuncture technique. A wire was advanced into the IVC and serial fascial dilation performed. A 9 French tips sheath was placed into the internal jugular vein and advanced to the IVC. The jugular sheath was retracted into the right atrium and manometry was performed. A 5 French angled tip catheter was then directed into the right hepatic vein. Hepatic venogram was performed. These images demonstrated  patent hepatic vein with no stenosis. The catheter was advanced to a wedge portion of the a patent vein over which the 9 French sheath was advanced into the right hepatic vein. Using ICE ultrasound visualization the catheter and right hepatic vein as well as the portal anatomy was defined. A planned exit site from the hepatic vein  and puncture site from the portal vein was placed into a single sonographic plane. Under direct ultrasound visualization, the score pNX needle was attempted to be advanced into the central right portal vein, without success. Therefore, using ICE ultrasound visualization the catheter was redirected into the middle hepatic vein. A planned exit site from the hepatic vein and puncture site from the portal vein was placed into a single sonographic plane. Under direct ultrasound visualization, the score pNX needle was advanced into the central right portal vein. Hand injection of contrast confirmed position within the portal system. A Glidewire Advantage was then advanced into the superior mesenteric vein. A 5 French marking pigtail catheter was then advanced over the wire into the main portal vein and wire removed. Portal venogram was performed which demonstrated a patent portal vein. 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 Portal manometry was then performed. The indwelling 9 French sheath was then exchanged over the wire for a 10 French, 45 cm dry seal sheath for anticipated endograft deployment. The tract was then dilated to 8 mm with an 8 mm x 100 mm Athletis balloon. A 8-10 mm by 7 + 2 mm of Viatorr was placed. This was ultimately dilated to 8 mm. After placement of the shunt, right atrial and portal pressures were repeated. Completion portal venogram demonstrates a patent TIPS endograft without significant gastroesophageal varices. The catheters and sheath were removed and manual compression was applied to the right internal jugular and right common femoral venous access sites until hemostasis was achieved. The patient was transferred to the PACU in stable condition. Pre-TIPS Mean Pressures (mmHg): Right atrium: 13 Portal vein: 27 Portosystemic gradient: 14 Post-TIPS Mean Pressures (mmHg): Right atrium:16 Portal vein: 23 Portosystemic gradient: 7 IMPRESSION: 1. Successful transjugular  portosystemic shunt creation. 2. Portosystemic gradient of 14 mm Hg (absolute portal venous pressure 27 mm Hg) before shunt placement and 7 mm Hg (absolute portal venous pressure 23 mm Hg) after shunt placement. PLAN: Interventional Radiology Portal Hypertension Clinic will follow up closely with patient after discharge. Plan for 1 month in person clinic visit. Marliss Coots, MD [facility] Radiology Electronically Signed   By: Marliss Coots M.D.   On: 09/05/2021 16:38    Labs:  CBC: Recent Labs    05/14/21 1546 08/12/21 1424 09/01/21 1533 09/05/21 1457 09/06/21 0316  WBC 7.6 5.9 7.1  --  6.4  HGB 8.5 Repeated and verified X2.* 9.3* 8.7* 8.8* 7.4*  HCT 25.3 Repeated and verified X2.* 27.6* 26.8* 26.0* 21.8*  PLT 152.0 180.0 140*  --  121*    COAGS: Recent Labs    03/28/21 1848 04/04/21 1558 04/17/21 1708 05/14/21 1546 07/24/21 1547 09/05/21 1200 09/06/21 0316  INR 1.7*   < > 2.1* 1.8* 1.3* 1.3* 1.7*  APTT 42*  --  51*  --   --   --   --    < > = values in this interval not displayed.    BMP: Recent Labs    04/10/21 0110 04/17/21 1708 05/14/21 1546 07/24/21 1547 08/12/21 1424 09/01/21 1533 09/05/21 1457 09/06/21 0316  NA 136  133*   < > 130* 132* 131* 133* 130*  K 3.7 3.7   < > 3.8 3.4* 4.1 3.9 3.9  CL 110 106   < > 100 106 106  --  104  CO2 21* 21*   < > 23 20 19*  --  19*  GLUCOSE 109* 108*   < > 106* 112* 126*  --  148*  BUN 7 18   < > 24* 20 24*  --  25*  CALCIUM 8.5* 8.7*   < > 8.7 8.7 8.5*  --  8.5*  CREATININE 1.09 1.29*   < > 1.62* 1.69* 1.92*  --  1.82*  GFRNONAA >60 >60  --   --   --  40*  --  42*   < > = values in this interval not displayed.    LIVER FUNCTION TESTS: Recent Labs    05/14/21 1546 07/24/21 1547 09/01/21 1533 09/06/21 0316  BILITOT 4.6* 1.9* 1.1 1.6*  AST 45* 34 40 67*  ALT 15 14 18 25   ALKPHOS 160* 123* 124 73  PROT 7.2 6.5 6.6 5.6*  ALBUMIN 2.9* 2.8* 2.8* 2.6*    Assessment  and Plan: Patient with history of alcoholic cirrhosis, recurrent ascites; status post paracentesis and TIPS creation yesterday; afebrile, WBC normal, hemoglobin 7.4 down from 8.8, platelets 121 down from 140k, sodium 130, creatinine 1.82 down from 1.92, total bilirubin 1.6, PT 20.1, INR 1.7; above findings discussed with Dr. ; we will plan to observe additional night and follow-up with labs again in a.m.  Plans discussed with patient.   Electronically Signed: D. Elby Showers, PA-C 09/06/2021, 9:12 AM   I spent a total of 20 minutes at the the patient's bedside AND on the patient's hospital floor or unit, greater than 50% of which was counseling/coordinating care for paracentesis and TIPS creation

## 2021-09-07 ENCOUNTER — Inpatient Hospital Stay (HOSPITAL_COMMUNITY): Payer: Medicaid Other

## 2021-09-07 ENCOUNTER — Encounter (HOSPITAL_COMMUNITY): Payer: Self-pay | Admitting: Interventional Radiology

## 2021-09-07 LAB — COMPREHENSIVE METABOLIC PANEL
ALT: 32 U/L (ref 0–44)
AST: 68 U/L — ABNORMAL HIGH (ref 15–41)
Albumin: 2.7 g/dL — ABNORMAL LOW (ref 3.5–5.0)
Alkaline Phosphatase: 91 U/L (ref 38–126)
Anion gap: 5 (ref 5–15)
BUN: 25 mg/dL — ABNORMAL HIGH (ref 6–20)
CO2: 19 mmol/L — ABNORMAL LOW (ref 22–32)
Calcium: 8.7 mg/dL — ABNORMAL LOW (ref 8.9–10.3)
Chloride: 105 mmol/L (ref 98–111)
Creatinine, Ser: 1.63 mg/dL — ABNORMAL HIGH (ref 0.61–1.24)
GFR, Estimated: 48 mL/min — ABNORMAL LOW (ref >60)
Glucose, Bld: 119 mg/dL — ABNORMAL HIGH (ref 70–99)
Potassium: 4.2 mmol/L (ref 3.5–5.1)
Sodium: 129 mmol/L — ABNORMAL LOW (ref 135–145)
Total Bilirubin: 1.5 mg/dL — ABNORMAL HIGH (ref 0.3–1.2)
Total Protein: 5.7 g/dL — ABNORMAL LOW (ref 6.5–8.1)

## 2021-09-07 LAB — CBC WITH DIFFERENTIAL/PLATELET
Abs Immature Granulocytes: 0.11 10*3/uL — ABNORMAL HIGH (ref 0.00–0.07)
Basophils Absolute: 0 10*3/uL (ref 0.0–0.1)
Basophils Relative: 0 %
Eosinophils Absolute: 0 10*3/uL (ref 0.0–0.5)
Eosinophils Relative: 0 %
HCT: 21.9 % — ABNORMAL LOW (ref 39.0–52.0)
Hemoglobin: 7.2 g/dL — ABNORMAL LOW (ref 13.0–17.0)
Immature Granulocytes: 1 %
Lymphocytes Relative: 5 %
Lymphs Abs: 1.1 10*3/uL (ref 0.7–4.0)
MCH: 32.1 pg (ref 26.0–34.0)
MCHC: 32.9 g/dL (ref 30.0–36.0)
MCV: 97.8 fL (ref 80.0–100.0)
Monocytes Absolute: 1.8 10*3/uL — ABNORMAL HIGH (ref 0.1–1.0)
Monocytes Relative: 9 %
Neutro Abs: 16.9 10*3/uL — ABNORMAL HIGH (ref 1.7–7.7)
Neutrophils Relative %: 85 %
Platelets: 132 10*3/uL — ABNORMAL LOW (ref 150–400)
RBC: 2.24 MIL/uL — ABNORMAL LOW (ref 4.22–5.81)
RDW: 15.9 % — ABNORMAL HIGH (ref 11.5–15.5)
WBC: 19.8 10*3/uL — ABNORMAL HIGH (ref 4.0–10.5)
nRBC: 0 % (ref 0.0–0.2)

## 2021-09-07 LAB — URINALYSIS, COMPLETE (UACMP) WITH MICROSCOPIC
Bacteria, UA: NONE SEEN
Bilirubin Urine: NEGATIVE
Glucose, UA: NEGATIVE mg/dL
Hgb urine dipstick: NEGATIVE
Ketones, ur: NEGATIVE mg/dL
Leukocytes,Ua: NEGATIVE
Nitrite: NEGATIVE
Protein, ur: NEGATIVE mg/dL
Specific Gravity, Urine: 1.011 (ref 1.005–1.030)
pH: 6 (ref 5.0–8.0)

## 2021-09-07 MED ORDER — OXYCODONE HCL 5 MG PO TABS: 5.0000 mg | ORAL_TABLET | Freq: Four times a day (QID) | ORAL | 0 refills | Status: DC | PRN

## 2021-09-07 MED ORDER — LACTULOSE 10 GM/15ML PO SOLN: 20.0000 g | Freq: Three times a day (TID) | ORAL | 0 refills | Status: DC

## 2021-09-07 NOTE — Discharge Instructions (Signed)
Resume home medications including lactulose, avoid strenuous activity for the next 3 to 4 days, do not drive after taking narcotic agents, if taking narcotic agents take with food

## 2021-09-07 NOTE — Discharge Summary (Signed)
Patient ID: Kyle Pitts MRN: [id number] DOB/AGE: Mar 25, 1961 61 y.o.  Admit date: 09/05/2021 Discharge date: 09/07/2021  Supervising Physician: Ruthann Cancer  Patient Status: [facility] - In-pt  Admission Diagnoses: Alcoholic cirrhosis, recurrent ascites  Discharge Diagnoses: Alcoholic cirrhosis, recurrent ascites, status post ultrasound-guided paracentesis and TIPS creation on 09/05/2021 Principal Problem:   Cirrhosis ([place]) Active Problems:   S/P TIPS (transjugular intrahepatic portosystemic shunt)  Past Medical History:  Diagnosis Date   Allergic reaction 11/21/2020   Cirrhosis ([place])    Hyponatremia    Myofasciitis 11/21/2020   Osteomyelitis of right leg ([place]) 11/21/2020   Septic arthritis ([place])    Streptococcus infection, group A 11/21/2020   Past Surgical History:  Procedure Laterality Date   BIOPSY  02/15/2021   Procedure: BIOPSY;  Surgeon: Daryel November, MD;  Location: [facility] ENDOSCOPY;  Service: Gastroenterology;;   ESOPHAGOGASTRODUODENOSCOPY N/A 02/15/2021   Procedure: ESOPHAGOGASTRODUODENOSCOPY (EGD);  Surgeon: Daryel November, MD;  Location: [place];  Service: Gastroenterology;  Laterality: N/A;   ESOPHAGOGASTRODUODENOSCOPY (EGD) WITH PROPOFOL N/A 03/19/2021   Procedure: ESOPHAGOGASTRODUODENOSCOPY (EGD) WITH PROPOFOL;  Surgeon: Sharyn Creamer, MD;  Location: [place];  Service: Gastroenterology;  Laterality: N/A;   HEMOSTASIS CLIP PLACEMENT  03/19/2021   Procedure: HEMOSTASIS CLIP PLACEMENT;  Surgeon: Sharyn Creamer, MD;  Location: [facility] ENDOSCOPY;  Service: Gastroenterology;;   I & D EXTREMITY Right 10/09/2020   Procedure: IRRIGATION AND DEBRIDEMENT EXTREMITY;  Surgeon: Nicholes Stairs, MD;  Location: [facility];  Service: Orthopedics;  Laterality: Right;   IR IVUS EACH ADDITIONAL NON CORONARY VESSEL  09/05/2021   IR PARACENTESIS  03/18/2021   IR PARACENTESIS  04/09/2021   IR PARACENTESIS  04/22/2021   IR PARACENTESIS  05/02/2021   IR PARACENTESIS   05/16/2021   IR PARACENTESIS  05/29/2021   IR PARACENTESIS  06/13/2021   IR PARACENTESIS  06/25/2021   IR PARACENTESIS  07/04/2021   IR PARACENTESIS  07/16/2021   IR PARACENTESIS  07/25/2021   IR PARACENTESIS  07/31/2021   IR PARACENTESIS  08/06/2021   IR PARACENTESIS  08/14/2021   IR PARACENTESIS  08/21/2021   IR PARACENTESIS  08/29/2021   IR PARACENTESIS  09/05/2021   IR RADIOLOGIST EVAL & MGMT  08/11/2021   IR TIPS  09/05/2021   IR US GUIDE VASC ACCESS RIGHT  09/05/2021   KNEE ARTHROSCOPY Right 09/27/2020   Procedure: ARTHROSCOPY KNEE, [place] OUT;  Surgeon: Melina Schools, MD;  Location: [facility];  Service: Orthopedics;  Laterality: Right;   KNEE ARTHROSCOPY Right 10/09/2020   Procedure: ARTHROSCOPY KNEE,ARTHROSCOPIC I & D, CALF ABSCESS;  Surgeon: Nicholes Stairs, MD;  Location: [facility];  Service: Orthopedics;  Laterality: Right;   RADIOLOGY WITH ANESTHESIA N/A 09/05/2021   Procedure: TIPS;  Surgeon: Suzette Battiest, MD;  Location: [place];  Service: Radiology;  Laterality: N/A;     Discharged Condition: good  Hospital Course: Kyle Pitts is a 61 year old male with history of alcoholic cirrhosis and recurrent ascites with a degree  of hepatorenal syndrome affecting maximization of diuresis.  He underwent consultation with Dr. Serafina Royals on 08/11/2021 to discuss TIPS procedure.  On 09/05/2021 he underwent ultrasound-guided paracentesis as well as TIPS creation via general anesthesia at [facility].  He was subsequently admitted for overnight observation.  Postprocedure patient did fairly well with some intermittent epigastric/right upper quadrant discomfort.  He was given oxycodone with some relief.  He was kept an additional day due to slight drop in hemoglobin?  partially dilutional.  On  the day of discharge he was stable.  He rated his epigastric pain as 2 out of 10.  He denied fever, headache, chest pain, worsening dyspnea, cough, back pain, nausea, vomiting or bleeding.  He was able to tolerate his diet  and void without difficulty.  Follow-up CBC revealed WBC of 19.8, hemoglobin 7.2, platelets 132k, Na 129, creatinine 1.63 down from 1.82, total bilirubin 1.5 down from 1.6, additional LFTs stable.  Chest x-ray was negative for any acute process.  UA was negative.  EKG showed sinus rhythm with occasional PACs.  Above findings were discussed with Dr. Serafina Royals and patient was deemed stable for discharge at this time.  He will resume his home medications including lactulose.  Electronic prescription was also sent in for oxycodone to patient's pharmacy.  IR team will follow up with patient in the next 3 to 4 weeks.  He was told to contact our service in the interim with any additional questions or concerns.  Consults: anesthesia  Significant Diagnostic Studies:  Results for orders placed or performed during the hospital encounter of 09/05/21  Protime-INR  Result Value Ref Range   Prothrombin Time 15.7 (H) 11.4 - 15.2 seconds   INR 1.3 (H) 0.8 - 1.2  CBC  Result Value Ref Range   WBC 6.4 4.0 - 10.5 K/uL   RBC 2.23 (L) 4.22 - 5.81 MIL/uL   Hemoglobin 7.4 (L) 13.0 - 17.0 g/dL   HCT 21.8 (L) 39.0 - 52.0 %   MCV 97.8 80.0 - 100.0 fL   MCH 33.2 26.0 - 34.0 pg   MCHC 33.9 30.0 - 36.0 g/dL   RDW 15.6 (H) 11.5 - 15.5 %   Platelets 121 (L) 150 - 400 K/uL   nRBC 0.0 0.0 - 0.2 %  Comprehensive metabolic panel  Result Value Ref Range   Sodium 130 (L) 135 - 145 mmol/L   Potassium 3.9 3.5 - 5.1 mmol/L   Chloride 104 98 - 111 mmol/L   CO2 19 (L) 22 - 32 mmol/L   Glucose, Bld 148 (H) 70 - 99 mg/dL   BUN 25 (H) 6 - 20 mg/dL   Creatinine, Ser 1.82 (H) 0.61 - 1.24 mg/dL   Calcium 8.5 (L) 8.9 - 10.3 mg/dL   Total Protein 5.6 (L) 6.5 - 8.1 g/dL   Albumin 2.6 (L) 3.5 - 5.0 g/dL   AST 67 (H) 15 - 41 U/L   ALT 25 0 - 44 U/L   Alkaline Phosphatase 73 38 - 126 U/L   Total Bilirubin 1.6 (H) 0.3 - 1.2 mg/dL   GFR, Estimated 42 (L) >60 mL/min   Anion gap 7 5 - 15  Protime-INR  Result Value Ref Range    Prothrombin Time 20.1 (H) 11.4 - 15.2 seconds   INR 1.7 (H) 0.8 - 1.2  CBC with Differential/Platelet  Result Value Ref Range   WBC 19.8 (H) 4.0 - 10.5 K/uL   RBC 2.24 (L) 4.22 - 5.81 MIL/uL   Hemoglobin 7.2 (L) 13.0 - 17.0 g/dL   HCT 21.9 (L) 39.0 - 52.0 %   MCV 97.8 80.0 - 100.0 fL   MCH 32.1 26.0 - 34.0 pg   MCHC 32.9 30.0 - 36.0 g/dL   RDW 15.9 (H) 11.5 - 15.5 %   Platelets 132 (L) 150 - 400 K/uL   nRBC 0.0 0.0 - 0.2 %   Neutrophils Relative % 85 %   Neutro Abs 16.9 (H) 1.7 - 7.7 K/uL   Lymphocytes Relative 5 %  Lymphs Abs 1.1 0.7 - 4.0 K/uL   Monocytes Relative 9 %   Monocytes Absolute 1.8 (H) 0.1 - 1.0 K/uL   Eosinophils Relative 0 %   Eosinophils Absolute 0.0 0.0 - 0.5 K/uL   Basophils Relative 0 %   Basophils Absolute 0.0 0.0 - 0.1 K/uL   Immature Granulocytes 1 %   Abs Immature Granulocytes 0.11 (H) 0.00 - 0.07 K/uL  Comprehensive metabolic panel  Result Value Ref Range   Sodium 129 (L) 135 - 145 mmol/L   Potassium 4.2 3.5 - 5.1 mmol/L   Chloride 105 98 - 111 mmol/L   CO2 19 (L) 22 - 32 mmol/L   Glucose, Bld 119 (H) 70 - 99 mg/dL   BUN 25 (H) 6 - 20 mg/dL   Creatinine, Ser 1.63 (H) 0.61 - 1.24 mg/dL   Calcium 8.7 (L) 8.9 - 10.3 mg/dL   Total Protein 5.7 (L) 6.5 - 8.1 g/dL   Albumin 2.7 (L) 3.5 - 5.0 g/dL   AST 68 (H) 15 - 41 U/L   ALT 32 0 - 44 U/L   Alkaline Phosphatase 91 38 - 126 U/L   Total Bilirubin 1.5 (H) 0.3 - 1.2 mg/dL   GFR, Estimated 48 (L) >60 mL/min   Anion gap 5 5 - 15  Urinalysis, Complete w Microscopic Urine, Clean Catch  Result Value Ref Range   Color, Urine YELLOW YELLOW   APPearance CLEAR CLEAR   Specific Gravity, Urine 1.011 1.005 - 1.030   pH 6.0 5.0 - 8.0   Glucose, UA NEGATIVE NEGATIVE mg/dL   Hgb urine dipstick NEGATIVE NEGATIVE   Bilirubin Urine NEGATIVE NEGATIVE   Ketones, ur NEGATIVE NEGATIVE mg/dL   Protein, ur NEGATIVE NEGATIVE mg/dL   Nitrite NEGATIVE NEGATIVE   Leukocytes,Ua NEGATIVE NEGATIVE   RBC / HPF 0-5 0 - 5  RBC/hpf   WBC, UA 0-5 0 - 5 WBC/hpf   Bacteria, UA NONE SEEN NONE SEEN  I-STAT 7, (LYTES, BLD GAS, ICA, H+H)  Result Value Ref Range   pH, Arterial 7.263 (L) 7.35 - 7.45   pCO2 arterial 45.0 32 - 48 mmHg   pO2, Arterial 144 (H) 83 - 108 mmHg   Bicarbonate 20.7 20.0 - 28.0 mmol/L   TCO2 22 22 - 32 mmol/L   O2 Saturation 99 %   Acid-base deficit 6.0 (H) 0.0 - 2.0 mmol/L   Sodium 133 (L) 135 - 145 mmol/L   Potassium 3.9 3.5 - 5.1 mmol/L   Calcium, Ion 1.22 1.15 - 1.40 mmol/L   HCT 26.0 (L) 39.0 - 52.0 %   Hemoglobin 8.8 (L) 13.0 - 17.0 g/dL   Patient temperature 35.6 C    Sample type ARTERIAL   Prepare RBC (crossmatch)  Result Value Ref Range   Order Confirmation      ORDERS RECEIVED TO CROSSMATCH Performed at [facility], [address], [place], [place] [address]      Treatments: Ultrasound-guided paracentesis and TIPS creation via general anesthesia on 09/05/2021  Discharge Exam: Blood pressure (!) 109/59, pulse 73, temperature 98.5 F (36.9 C), temperature source Oral, resp. rate 18, height 6' (1.829 m), weight 190 lb (86.2 kg), SpO2 99 %. Awake, alert.  Chest clear to auscultation bilaterally.  Heart with normal rate, occasional ectopy noted.  Abdomen slightly distended, soft, positive bowel sounds, some mild tenderness to right upper and lateral portions.  Trace pretibial edema bilaterally.  Puncture sites right IJ and right common femoral vein soft, clean, dry, nontender.  No hematoma.  Patient does have known wound to right anterior tibial region with no increased erythema or drainage at site.  Bandage in place.  Disposition: Discharge disposition: 01-Home or Self Care       Discharge Instructions     Call MD for:  difficulty breathing, headache or visual disturbances   Complete by: As directed    Call MD for:  extreme fatigue   Complete by: As directed    Call MD for:  hives   Complete by: As directed    Call MD for:  persistant dizziness or  light-headedness   Complete by: As directed    Call MD for:  persistant nausea and vomiting   Complete by: As directed    Call MD for:  redness, tenderness, or signs of infection (pain, swelling, redness, odor or green/yellow discharge around incision site)   Complete by: As directed    Call MD for:  severe uncontrolled pain   Complete by: As directed    Call MD for:  temperature >100.4   Complete by: As directed    Change dressing (specify)   Complete by: As directed    May change bandages over right neck, right lateral abdominal region and right groin region and place Band-Aids to site daily for the next 2 to 3 days.  May wash sites with soap and water.   Diet - low sodium heart healthy   Complete by: As directed    Discharge instructions   Complete by: As directed    Resume home medications including lactulose, avoid driving after taking narcotic agents, avoid strenuous activity for the next 3 to 4 days, if you do take narcotic agents take with food   Increase activity slowly   Complete by: As directed    May shower / Bathe   Complete by: As directed    May walk up steps   Complete by: As directed       Allergies as of 09/07/2021       Reactions   Latex Itching   Tape Itching   Amoxicillin Itching   Itching around eyes and scalp on amoxicilin   Cephalexin Nausea Only   Upset stomach Ceftriaxone is ok   Penicillins Rash        Medication List     TAKE these medications    furosemide 40 MG tablet Commonly known as: Lasix Take 1 tablet (40 mg total) by mouth daily.   lactulose (encephalopathy) 10 GM/15ML Soln Commonly known as: CHRONULAC Take 30 mLs (20 g total) by mouth 2 (two) times daily.   lactulose 10 GM/15ML solution Commonly known as: CHRONULAC Take 30 mLs (20 g total) by mouth 3 (three) times daily.   midodrine 10 MG tablet Commonly known as: PROAMATINE Take 1 tablet (10 mg total) by mouth 3 (three) times daily with meals.   oxyCODONE 5 MG  immediate release tablet Commonly known as: Oxy IR/ROXICODONE Take 1 tablet (5 mg total) by mouth every 6 (six) hours as needed for moderate pain.   pantoprazole 40 MG tablet Commonly known as: PROTONIX Take 1 tablet (40 mg total) by mouth 2 (two) times daily.   spironolactone 25 MG tablet Commonly known as: ALDACTONE Take 2 tablets (50 mg total) by mouth 2 (two) times daily. What changed: how much to take               Discharge Care Instructions  (From admission, onward)  Start     Ordered   09/07/21 0000  Change dressing (specify)       Comments: May change bandages over right neck, right lateral abdominal region and right groin region and place Band-Aids to site daily for the next 2 to 3 days.  May wash sites with soap and water.   09/07/21 1230            Follow-up Information     Suttle, Rosanne Ashing, MD Follow up.   Specialties: Interventional Radiology, Diagnostic Radiology, Radiology Why: Radiology team will call you with follow-up with Dr. Serafina Royals within the next 3 to 4 weeks; call [phone] or [phone] with any questions Contact information: [place] [address] [id number]         Daryel November, MD Follow up.   Specialty: Gastroenterology Why: Follow-up with Dr. Candis Schatz as scheduled Contact information: [place] [address] [phone]                  Electronically Signed: D. Rowe Robert, PA-C 09/07/2021, 12:37 PM   I have spent Less Than 30 Minutes discharging Kyle Pitts.

## 2021-09-08 NOTE — Anesthesia Postprocedure Evaluation (Signed)
Anesthesia Post Note  Patient: Kyle Pitts  Procedure(s) Performed: TIPS     Patient location during evaluation: PACU Anesthesia Type: General Level of consciousness: awake and alert Pain management: pain level controlled Vital Signs Assessment: post-procedure vital signs reviewed and stable Respiratory status: spontaneous breathing, nonlabored ventilation, respiratory function stable and patient connected to nasal cannula oxygen Cardiovascular status: blood pressure returned to baseline and stable Postop Assessment: no apparent nausea or vomiting Anesthetic complications: no   No notable events documented.  Last Vitals:  Vitals:   09/07/21 0502 09/07/21 0733  BP: (!) 99/54 (!) 109/59  Pulse: 72 73  Resp: 19 18  Temp: 36.9 C 36.9 C  SpO2: 95% 99%    Last Pain:  Vitals:   09/07/21 0733  TempSrc: Oral  PainSc: 0-No pain                 Rafaella Kole L Apollonia Amini

## 2021-09-09 ENCOUNTER — Other Ambulatory Visit: Payer: Self-pay | Admitting: Interventional Radiology

## 2021-09-09 DIAGNOSIS — K7460 Unspecified cirrhosis of liver: Secondary | ICD-10-CM

## 2021-09-10 LAB — TYPE AND SCREEN
ABO/RH(D): A POS
Antibody Screen: NEGATIVE
Unit division: 0
Unit division: 0
Unit division: 0
Unit division: 0
Unit division: 0

## 2021-09-10 LAB — BPAM RBC
Blood Product Expiration Date: 202305272359
Blood Product Expiration Date: 202306062359
Blood Product Expiration Date: 202306062359
Blood Product Expiration Date: 202306072359
Blood Product Expiration Date: 202306082359
ISSUE DATE / TIME: 202305172053
ISSUE DATE / TIME: 202305221206
ISSUE DATE / TIME: 202305232147
ISSUE DATE / TIME: 202305240110
Unit Type and Rh: 6200
Unit Type and Rh: 6200
Unit Type and Rh: 6200
Unit Type and Rh: 6200
Unit Type and Rh: 6200

## 2021-09-17 ENCOUNTER — Telehealth: Payer: Self-pay | Admitting: Student

## 2021-09-17 NOTE — Telephone Encounter (Signed)
   Portal Hypertension Clinic TIPS post-procedure phone call follow-up   Kyle Pitts is a 61 y.o. male who underwent TIPS creation at [facility]  09/05/21 by Dr. Elby Showers.  Patient is 12 days out from procedure.   # of paracentesis in last month: 3 including one during TIPS # of paracentesis in last 2 months: 8  Current diuretic regimen: Lasix 40 mg daily, Spironolactone 50 mg BID Current pharmacologic encephalopathy prophylaxis/treatment: Lactulose 20 gram BID  Post-TIPS Imaging:   Labs: Creatinine: 1.82 Total Bilirubin: 1.6 INR: n/a Sodium: 130 Albumin: 2.6 Ammonia: n/a  Assessment: Kyle Pitts is a 61 y.o. male with history of cirrhosis. Patient is 11 days from TIPS creation and is feeling well. He is eating and drinking well and has decent energy levels. He denies any significant pain or discomfort but states he is feeling some mild abdominal distention. He thinks he may need a paracentesis soon. I explained that it's not uncommon for patients to need a paracentesis or two in the immediate post-TIPS period. I encouraged Kyle Pitts to call his GI doctor to get a paracentesis ordered if he feels he needs one.   Recommendation:  Continue current treatment plan with next Clinic follow up telephone call scheduled for 09/24/21.  Electronically Signed: Mickie Kay, NP 09/17/2021, 2:48 PM

## 2021-09-22 ENCOUNTER — Telehealth: Payer: Self-pay | Admitting: Gastroenterology

## 2021-09-22 NOTE — Telephone Encounter (Signed)
Spoke to pt and let him know to call rad scheduling at [phone] to set up  the paracentesis. Pt has standing orders. Pt verbalized understanding.

## 2021-09-22 NOTE — Telephone Encounter (Signed)
Patient called requesting to have a paracentesis set up sometime this week.  He said Wednesday, Thursday, or Friday, anytime after 12 would work for him.  Please call and advise.  Thank you.

## 2021-09-24 ENCOUNTER — Ambulatory Visit (HOSPITAL_COMMUNITY)
Admission: RE | Admit: 2021-09-24 | Discharge: 2021-09-24 | Disposition: A | Discharge status: Home / Self Care | Payer: Medicaid Other | Source: Ambulatory Visit | Attending: Gastroenterology | Admitting: Gastroenterology

## 2021-09-24 DIAGNOSIS — R188 Other ascites: Secondary | ICD-10-CM | POA: Insufficient documentation

## 2021-09-24 HISTORY — PX: IR PARACENTESIS: IMG2679

## 2021-09-24 LAB — BODY FLUID CELL COUNT WITH DIFFERENTIAL
Eos, Fluid: 0 %
Lymphs, Fluid: 29 %
Monocyte-Macrophage-Serous Fluid: 68 % (ref 50–90)
Neutrophil Count, Fluid: 3 % (ref 0–25)
Total Nucleated Cell Count, Fluid: 85 cu mm (ref 0–1000)

## 2021-09-24 MED ORDER — LIDOCAINE HCL 1 % IJ SOLN
INTRAMUSCULAR | Status: AC
  Filled 2021-09-24: qty 20

## 2021-09-24 NOTE — Procedures (Signed)
PROCEDURE SUMMARY:  Successful ultrasound guided paracentesis from the right upper quadrant.  Yielded 2.1 L of clear yellow fluid.  No immediate complications.  The patient tolerated the procedure well.   Specimen was sent for labs.  EBL < 60mL  Patient is s/p TIPS creation 09/05/21 with Dr. Elby Showers. He is being followed by the [facility] Radiology Portal Hypertension Clinic.  Lynnette Caffey, PA-C

## 2021-09-26 LAB — PATHOLOGIST SMEAR REVIEW

## 2021-10-03 ENCOUNTER — Telehealth: Payer: Self-pay | Admitting: Gastroenterology

## 2021-10-03 ENCOUNTER — Other Ambulatory Visit (HOSPITAL_COMMUNITY): Payer: Self-pay | Admitting: Gastroenterology

## 2021-10-03 DIAGNOSIS — R188 Other ascites: Secondary | ICD-10-CM

## 2021-10-03 MED ORDER — MIDODRINE HCL 10 MG PO TABS: 10.0000 mg | ORAL_TABLET | Freq: Three times a day (TID) | ORAL | 0 refills | Status: DC

## 2021-10-03 MED ORDER — PANTOPRAZOLE SODIUM 40 MG PO TBEC
40.0000 mg | DELAYED_RELEASE_TABLET | Freq: Two times a day (BID) | ORAL | 0 refills | Status: DC
Start: 2021-10-03 — End: 2022-02-02

## 2021-10-03 NOTE — Telephone Encounter (Signed)
Refills sent and patient informed

## 2021-10-03 NOTE — Telephone Encounter (Signed)
Patient called and needs refills for midodrine and pantoprazole.  His pharmacy closes at 6 and is not open on the weekends.  He asked if you could please take care of that this afternoon so he could get the meds before his pharmacy closed.  Thank you.

## 2021-10-07 ENCOUNTER — Ambulatory Visit (HOSPITAL_COMMUNITY)
Admission: RE | Admit: 2021-10-07 | Discharge: 2021-10-07 | Disposition: A | Discharge status: Home / Self Care | Payer: Medicaid Other | Source: Ambulatory Visit | Attending: Gastroenterology | Admitting: Gastroenterology

## 2021-10-07 VITALS — BP 120/64

## 2021-10-07 DIAGNOSIS — K7460 Unspecified cirrhosis of liver: Secondary | ICD-10-CM | POA: Insufficient documentation

## 2021-10-07 DIAGNOSIS — R188 Other ascites: Secondary | ICD-10-CM | POA: Diagnosis not present

## 2021-10-07 DIAGNOSIS — Z95828 Presence of other vascular implants and grafts: Secondary | ICD-10-CM

## 2021-10-07 HISTORY — PX: IR PARACENTESIS: IMG2679

## 2021-10-07 LAB — CBC
HCT: 22.9 % — ABNORMAL LOW (ref 39.0–52.0)
Hemoglobin: 7.9 g/dL — ABNORMAL LOW (ref 13.0–17.0)
MCH: 32.6 pg (ref 26.0–34.0)
MCHC: 34.5 g/dL (ref 30.0–36.0)
MCV: 94.6 fL (ref 80.0–100.0)
Platelets: 129 10*3/uL — ABNORMAL LOW (ref 150–400)
RBC: 2.42 MIL/uL — ABNORMAL LOW (ref 4.22–5.81)
RDW: 15.8 % — ABNORMAL HIGH (ref 11.5–15.5)
WBC: 5.5 10*3/uL (ref 4.0–10.5)
nRBC: 0 % (ref 0.0–0.2)

## 2021-10-07 LAB — COMPREHENSIVE METABOLIC PANEL
ALT: 17 U/L (ref 0–44)
AST: 38 U/L (ref 15–41)
Albumin: 2.3 g/dL — ABNORMAL LOW (ref 3.5–5.0)
Alkaline Phosphatase: 177 U/L — ABNORMAL HIGH (ref 38–126)
Anion gap: 7 (ref 5–15)
BUN: 13 mg/dL (ref 6–20)
CO2: 21 mmol/L — ABNORMAL LOW (ref 22–32)
Calcium: 8.1 mg/dL — ABNORMAL LOW (ref 8.9–10.3)
Chloride: 103 mmol/L (ref 98–111)
Creatinine, Ser: 1.43 mg/dL — ABNORMAL HIGH (ref 0.61–1.24)
GFR, Estimated: 56 mL/min — ABNORMAL LOW (ref >60)
Glucose, Bld: 93 mg/dL (ref 70–99)
Potassium: 3.6 mmol/L (ref 3.5–5.1)
Sodium: 131 mmol/L — ABNORMAL LOW (ref 135–145)
Total Bilirubin: 2.1 mg/dL — ABNORMAL HIGH (ref 0.3–1.2)
Total Protein: 5.8 g/dL — ABNORMAL LOW (ref 6.5–8.1)

## 2021-10-07 LAB — BODY FLUID CELL COUNT WITH DIFFERENTIAL
Eos, Fluid: 0 %
Lymphs, Fluid: 32 %
Monocyte-Macrophage-Serous Fluid: 67 % (ref 50–90)
Neutrophil Count, Fluid: 1 % (ref 0–25)
Total Nucleated Cell Count, Fluid: 18 cu mm (ref 0–1000)

## 2021-10-07 MED ORDER — LIDOCAINE HCL 1 % IJ SOLN
INTRAMUSCULAR | Status: AC
  Administered 2021-10-07: 10 mL
  Filled 2021-10-07: qty 20

## 2021-10-07 NOTE — Progress Notes (Signed)
   Portal Hypertension Clinic TIPS post-procedure follow-up   Kyle Pitts is a 61 y.o. male who underwent TIPS creation at [facility] 09/05/21 by Dr. Elby Showers   Patient is one month from procedure.   # of paracentesis in last month: 2 # of paracentesis in last 2 months: 5 including one during TIPS  Current diuretic regimen: Lasix 40 mg daily, spironolactone 50 mg BID Current pharmacologic encephalopathy prophylaxis/treatment: Lactulose 20 grams BID  Post-TIPS Imaging: None   Labs: Creatinine: 1.82 Total Bilirubin: 1.6 INR: 1.7 Sodium: 130 Albumin: 2.6 Ammonia: n/a  Assessment: Kyle Pitts is a 61 y.o. male with history of Cirrhosis. Patient is one month from TIPS creation and was seen today at [facility] while he was undergoing a paracentesis. 3L removed today. He is feeling well but is having more lower extremity/lower abdominal edema. Physical exam shows some mild anasarca in the lower abdominal area. He endorses good appetite and fair energy levels. He is in good spirits today. He does not know when his next visit with his GI doctor is. He states he is taking all medications as directed.    Recommendation:  Continue current treatment plan. Patient is overdue for his follow up with Dr. Elby Showers - an appointment has been set for Mr. Scheer to have a tele-visit with Dr. Elby Showers on 10/14/21 at 2:30 pm. Mr. Snyders will get labs drawn today: CBC and CMP. All of this information was discussed with Mr. Brannan who is in agreement.    Electronically Signed: Mickie Kay, NP 10/07/2021, 2:34 PM

## 2021-10-07 NOTE — Procedures (Signed)
PROCEDURE SUMMARY:  Successful ultrasound guided paracentesis from the right lower quadrant.  Yielded 3 L of clear yellow fluid.  No immediate complications.  The patient tolerated the procedure well.   Specimen sent for labs.  EBL < 2 mL   Patient is s/p TIPS creation and is being followed by the Portal Hypertension Clinic.  Alwyn Ren, [place] [phone] 10/07/2021, 3:05 PM

## 2021-10-09 LAB — PATHOLOGIST SMEAR REVIEW: Path Review: NEGATIVE

## 2021-10-14 ENCOUNTER — Ambulatory Visit
Admission: RE | Admit: 2021-10-14 | Discharge: 2021-10-14 | Disposition: A | Discharge status: Home / Self Care | Payer: Medicaid Other | Source: Ambulatory Visit | Attending: Radiology | Admitting: Radiology

## 2021-10-14 ENCOUNTER — Encounter: Payer: Self-pay | Admitting: *Deleted

## 2021-10-14 DIAGNOSIS — Z95828 Presence of other vascular implants and grafts: Secondary | ICD-10-CM

## 2021-10-14 DIAGNOSIS — K7031 Alcoholic cirrhosis of liver with ascites: Secondary | ICD-10-CM

## 2021-10-14 HISTORY — PX: IR RADIOLOGIST EVAL & MGMT: IMG5224

## 2021-10-14 NOTE — Procedures (Signed)
Referring Physician(s): Tiajuana Amass, MD  Reason for Follow Up: Virtual telephone visit 1 month after TIPS creation  History of present illness: Kyle Pitts is a 61 y.o. male with history of alcoholic cirrhosis and recurrent ascites (Child Pugh B, MELD 22) with a degree of hepatorenal syndrome affecting maximization of diuresis.  Given that elevated MELD was primarily due to renal function, TIPS creation was performed on 09/05/21 which was uncomplicated.  A 7+2 cm Viatorr was placed and balloon molded to 8 mm with mean portosystemic gradient decrease from 14 to 7 mmHg.  Paracentesis was also performed draining 7.4 L.  Post-procedure course was complicated by pain, which required 2 nights admission.  The pain improved and he was discharged home.  He reports feeling much better.  He has required 2 paracenteses since TIPS, yielding 2.1 and 3 L, down from his prior average of approximately 7 L weekly.  He endorses no hepatic encephalopathy and remains compliant with lactulose.  He endorses some lower extremity swelling in the left leg greater than the right, however is still mobile.  He states this has mildly improved over the past 2 weeks.  No chest pain or shortness of breath.  He remains on furosemide 40 mg QD and spironolactone 50 mg BID.  He had labs drawn on 10/07/21.  Past Medical History:  Diagnosis Date   Allergic reaction 11/21/2020   Cirrhosis (HCC)    Hyponatremia    Myofasciitis 11/21/2020   Osteomyelitis of right leg (HCC) 11/21/2020   Septic arthritis (HCC)    Streptococcus infection, group A 11/21/2020    Past Surgical History:  Procedure Laterality Date   BIOPSY  02/15/2021   Procedure: BIOPSY;  Surgeon: Jenel Lucks, MD;  Location: [facility] ENDOSCOPY;  Service: Gastroenterology;;   ESOPHAGOGASTRODUODENOSCOPY N/A 02/15/2021   Procedure: ESOPHAGOGASTRODUODENOSCOPY (EGD);  Surgeon: Jenel Lucks, MD;  Location: [facility] ENDOSCOPY;  Service: Gastroenterology;   Laterality: N/A;   ESOPHAGOGASTRODUODENOSCOPY (EGD) WITH PROPOFOL N/A 03/19/2021   Procedure: ESOPHAGOGASTRODUODENOSCOPY (EGD) WITH PROPOFOL;  Surgeon: Imogene Burn, MD;  Location: [facility] ENDOSCOPY;  Service: Gastroenterology;  Laterality: N/A;   HEMOSTASIS CLIP PLACEMENT  03/19/2021   Procedure: HEMOSTASIS CLIP PLACEMENT;  Surgeon: Imogene Burn, MD;  Location: [facility] ENDOSCOPY;  Service: Gastroenterology;;   I & D EXTREMITY Right 10/09/2020   Procedure: IRRIGATION AND DEBRIDEMENT EXTREMITY;  Surgeon: Yolonda Kida, MD;  Location: [facility] ORS;  Service: Orthopedics;  Laterality: Right;   IR IVUS EACH ADDITIONAL NON CORONARY VESSEL  09/05/2021   IR PARACENTESIS  03/18/2021   IR PARACENTESIS  04/09/2021   IR PARACENTESIS  04/22/2021   IR PARACENTESIS  05/02/2021   IR PARACENTESIS  05/16/2021   IR PARACENTESIS  05/29/2021   IR PARACENTESIS  06/13/2021   IR PARACENTESIS  06/25/2021   IR PARACENTESIS  07/04/2021   IR PARACENTESIS  07/16/2021   IR PARACENTESIS  07/25/2021   IR PARACENTESIS  07/31/2021   IR PARACENTESIS  08/06/2021   IR PARACENTESIS  08/14/2021   IR PARACENTESIS  08/21/2021   IR PARACENTESIS  08/29/2021   IR PARACENTESIS  09/05/2021   IR PARACENTESIS  09/24/2021   IR PARACENTESIS  10/07/2021   IR RADIOLOGIST EVAL & MGMT  08/11/2021   IR TIPS  09/05/2021   IR US GUIDE VASC ACCESS RIGHT  09/05/2021   KNEE ARTHROSCOPY Right 09/27/2020   Procedure: ARTHROSCOPY KNEE, WASH OUT;  Surgeon: Venita Lick, MD;  Location: [facility] ORS;  Service: Orthopedics;  Laterality: Right;  KNEE ARTHROSCOPY Right 10/09/2020   Procedure: ARTHROSCOPY KNEE,ARTHROSCOPIC I & D, CALF ABSCESS;  Surgeon: Yolonda Kida, MD;  Location: [facility] ORS;  Service: Orthopedics;  Laterality: Right;   RADIOLOGY WITH ANESTHESIA N/A 09/05/2021   Procedure: TIPS;  Surgeon: Bennie Dallas, MD;  Location: [facility] OR;  Service: Radiology;  Laterality: N/A;    Allergies: Latex, Tape, Amoxicillin, Cephalexin, and Penicillins  Medications: Prior to  Admission medications   Medication Sig Start Date End Date Taking? Authorizing Provider  furosemide (LASIX) 40 MG tablet Take 1 tablet (40 mg total) by mouth daily. 05/14/21 05/09/22  Jenel Lucks, MD  lactulose (CHRONULAC) 10 GM/15ML solution Take 30 mLs (20 g total) by mouth 3 (three) times daily. 09/07/21   Allred, Darrell K, PA-C  lactulose, encephalopathy, (CHRONULAC) 10 GM/15ML SOLN Take 30 mLs (20 g total) by mouth 2 (two) times daily. 07/28/21   Jenel Lucks, MD  midodrine (PROAMATINE) 10 MG tablet Take 1 tablet (10 mg total) by mouth 3 (three) times daily with meals. 10/03/21   Jenel Lucks, MD  oxyCODONE (OXY IR/ROXICODONE) 5 MG immediate release tablet Take 1 tablet (5 mg total) by mouth every 6 (six) hours as needed for moderate pain. 09/07/21   Allred, Darrell K, PA-C  pantoprazole (PROTONIX) 40 MG tablet Take 1 tablet (40 mg total) by mouth 2 (two) times daily. 10/03/21 09/28/22  Jenel Lucks, MD  spironolactone (ALDACTONE) 25 MG tablet Take 2 tablets (50 mg total) by mouth 2 (two) times daily. Patient taking differently: Take 25 mg by mouth 2 (two) times daily. 06/25/21   Jenel Lucks, MD     Family History  Problem Relation Age of Onset   Hepatitis Mother    Cirrhosis Mother    Stroke Father    Heart failure Father    Colon cancer Father    Kidney failure Brother    Liver cancer Brother    Stomach cancer Neg Hx    Esophageal cancer Neg Hx     Social History   Socioeconomic History   Marital status: Divorced    Spouse name: Not on file   Number of children: 2   Years of education: Not on file   Highest education level: Not on file  Occupational History   Not on file  Tobacco Use   Smoking status: Never   Smokeless tobacco: Never  Vaping Use   Vaping Use: Never used  Substance and Sexual Activity   Alcohol use: Not Currently   Drug use: Not Currently   Sexual activity: Not Currently  Other Topics Concern   Not on file  Social  History Narrative   Not on file   Social Determinants of Health   Financial Resource Strain: Not on file  Food Insecurity: Not on file  Transportation Needs: Not on file  Physical Activity: Not on file  Stress: Not on file  Social Connections: Not on file     Vital Signs: There were no vitals taken for this visit.  No physical examination was performed in lieu of virtual telephone clinic visit.  Imaging: TIPS 09/05/21   Labs:  CBC: Recent Labs    09/01/21 1533 09/05/21 1457 09/06/21 0316 09/07/21 0104 10/07/21 1505  WBC 7.1  --  6.4 19.8* 5.5  HGB 8.7* 8.8* 7.4* 7.2* 7.9*  HCT 26.8* 26.0* 21.8* 21.9* 22.9*  PLT 140*  --  121* 132* 129*    COAGS: Recent Labs    03/28/21 1848 04/04/21 1558  04/17/21 1708 05/14/21 1546 07/24/21 1547 09/05/21 1200 09/06/21 0316  INR 1.7*   < > 2.1* 1.8* 1.3* 1.3* 1.7*  APTT 42*  --  51*  --   --   --   --    < > = values in this interval not displayed.    BMP: Recent Labs    09/01/21 1533 09/05/21 1457 09/06/21 0316 09/07/21 0104 10/07/21 1505  NA 131* 133* 130* 129* 131*  K 4.1 3.9 3.9 4.2 3.6  CL 106  --  104 105 103  CO2 19*  --  19* 19* 21*  GLUCOSE 126*  --  148* 119* 93  BUN 24*  --  25* 25* 13  CALCIUM 8.5*  --  8.5* 8.7* 8.1*  CREATININE 1.92*  --  1.82* 1.63* 1.43*  GFRNONAA 40*  --  42* 48* 56*    LIVER FUNCTION TESTS: Recent Labs    09/01/21 1533 09/06/21 0316 09/07/21 0104 10/07/21 1505  BILITOT 1.1 1.6* 1.5* 2.1*  AST 40 67* 68* 38  ALT 18 25 32 17  ALKPHOS 124 73 91 177*  PROT 6.6 5.6* 5.7* 5.8*  ALBUMIN 2.8* 2.6* 2.7* 2.3*    Assessment and Plan: DORAIN CICOTTE is a 61 y.o. male with history of alcoholic cirrhosis and recurrent ascites (Child Pugh B, MELD 22-->19 post TIPS) with a degree of hepatorenal syndrome affecting maximization of diuresis.  Given that elevated MELD was primarily due to renal function, TIPS creation was performed on 09/05/21 which was uncomplicated.  He is  recovering well post-procedure with some third spacing into his lower extremities and persistent ascites albeit decreased from pre-TIPS.  We discussed the typical course of auto-diuresis that takes approximately 2-3 months to settle and how lower extremity swelling is not uncommon, but should resolve with time.  He could also possibly increase his diuretics now that his creatinine is improving.  I will discuss with with Dr. Candis Schatz.   -We will plan to follow up again in 1 month to assess fluid status. -Continue lactulose. -Continue diuretics, consider increase given improved renal function s/p TIPS as per Dr. Candis Schatz.   Ruthann Cancer, MD Pager: [phone] Clinic: [phone]    I spent a total of 25 Minutes in virtual telephone clinical consultation, greater than 50% of which was counseling/coordinating care for portal hypertension.

## 2021-10-16 ENCOUNTER — Other Ambulatory Visit: Payer: Self-pay

## 2021-10-16 ENCOUNTER — Telehealth: Payer: Self-pay

## 2021-10-16 DIAGNOSIS — K7031 Alcoholic cirrhosis of liver with ascites: Secondary | ICD-10-CM

## 2021-10-16 MED ORDER — FUROSEMIDE 20 MG PO TABS: ORAL_TABLET | ORAL | 3 refills | Status: DC

## 2021-10-16 NOTE — Telephone Encounter (Signed)
Jenel Lucks, MD  Chrystie Nose, RN Bonita Quin,  Could you please place a script for lasix 20 mg PO qpm for Mr. Bettendorf? He will take this in addition the 40 mg PO qam.  I would like to repeat a BMP in 2 weeks to check his renal function and electrolytes.   Please contact him and let him know about the dose increase and need to repeat labs   Thank you        Prescription sent to pharmacy, lab order and reminder in epic.Marland Kitchen Pt aware.

## 2021-10-23 ENCOUNTER — Ambulatory Visit (HOSPITAL_COMMUNITY)
Admission: RE | Admit: 2021-10-23 | Discharge: 2021-10-23 | Disposition: A | Discharge status: Home / Self Care | Payer: Medicaid Other | Source: Ambulatory Visit | Attending: Gastroenterology | Admitting: Gastroenterology

## 2021-10-23 DIAGNOSIS — R188 Other ascites: Secondary | ICD-10-CM | POA: Insufficient documentation

## 2021-10-23 HISTORY — PX: IR PARACENTESIS: IMG2679

## 2021-10-23 LAB — BODY FLUID CELL COUNT WITH DIFFERENTIAL
Eos, Fluid: 1 %
Lymphs, Fluid: 23 %
Monocyte-Macrophage-Serous Fluid: 70 % (ref 50–90)
Neutrophil Count, Fluid: 6 % (ref 0–25)
Total Nucleated Cell Count, Fluid: 143 cu mm (ref 0–1000)

## 2021-10-23 MED ORDER — LIDOCAINE HCL 1 % IJ SOLN
INTRAMUSCULAR | Status: AC
  Filled 2021-10-23: qty 20

## 2021-10-23 NOTE — Progress Notes (Signed)
PROCEDURE SUMMARY:  Successful US guided paracentesis from right lateral abdomen.  Yielded 1.7 liters of yellow fluid.  No immediate complications.  Pt tolerated well.   Specimen was sent for labs.  EBL < 15mL  Hoyt Koch PA-C 10/23/2021 4:03 PM

## 2021-10-25 LAB — PATHOLOGIST SMEAR REVIEW

## 2021-10-29 ENCOUNTER — Other Ambulatory Visit: Payer: Self-pay | Admitting: Interventional Radiology

## 2021-10-29 DIAGNOSIS — K7031 Alcoholic cirrhosis of liver with ascites: Secondary | ICD-10-CM

## 2021-12-04 ENCOUNTER — Telehealth: Payer: Self-pay | Admitting: Gastroenterology

## 2021-12-04 ENCOUNTER — Other Ambulatory Visit: Payer: Self-pay

## 2021-12-04 MED ORDER — LACTULOSE ENCEPHALOPATHY 10 GM/15ML PO SOLN: 20.0000 g | Freq: Two times a day (BID) | ORAL | 3 refills | Status: DC

## 2021-12-04 MED ORDER — FUROSEMIDE 20 MG PO TABS: ORAL_TABLET | ORAL | 3 refills | Status: DC

## 2021-12-04 NOTE — Telephone Encounter (Signed)
Medication refill sent to patient's pharmacy.  

## 2021-12-04 NOTE — Telephone Encounter (Signed)
PT is calling in to have refills sent in for Furosemide 20 and 40 mg and Lactulose. The [facility] he uses closes on the weekends and he's hoping to have it in by tomorrow if possible. Thank you.

## 2021-12-04 NOTE — Telephone Encounter (Signed)
Patient received the 20mg  but not the 40mg  for Furosemide.

## 2021-12-05 ENCOUNTER — Other Ambulatory Visit: Payer: Self-pay

## 2021-12-05 MED ORDER — FUROSEMIDE 20 MG PO TABS: 20.0000 mg | ORAL_TABLET | Freq: Two times a day (BID) | ORAL | 1 refills | Status: DC

## 2021-12-05 MED ORDER — FUROSEMIDE 20 MG PO TABS: ORAL_TABLET | ORAL | 1 refills | Status: DC

## 2021-12-05 NOTE — Telephone Encounter (Signed)
Left detailed message for patient to return call  

## 2021-12-05 NOTE — Telephone Encounter (Signed)
Patient returned call and was informed to only do Furosamide 20 mg twice daily for now. Patient stated understanding and had no questions at the end of call.

## 2021-12-05 NOTE — Addendum Note (Signed)
Addended by: Justice Britain on: 12/05/2021 01:13 PM   Modules accepted: Orders

## 2021-12-15 ENCOUNTER — Ambulatory Visit
Admission: RE | Admit: 2021-12-15 | Discharge: 2021-12-15 | Disposition: A | Discharge status: Home / Self Care | Payer: Medicaid Other | Source: Ambulatory Visit | Attending: Interventional Radiology | Admitting: Interventional Radiology

## 2021-12-15 ENCOUNTER — Encounter: Payer: Self-pay | Admitting: *Deleted

## 2021-12-15 DIAGNOSIS — K7031 Alcoholic cirrhosis of liver with ascites: Secondary | ICD-10-CM

## 2021-12-15 HISTORY — PX: IR RADIOLOGIST EVAL & MGMT: IMG5224

## 2021-12-15 NOTE — Progress Notes (Signed)
Referring Physician(s): Tiajuana Amass, MD   Reason for Follow Up: Virtual telephone visit 3 months after TIPS creation  History of present illness: Kyle Pitts is a 61 y.o. male with history of alcoholic cirrhosis and recurrent ascites (Child Pugh B, MELD 22) with a degree of hepatorenal syndrome affecting maximization of diuresis.  Given that elevated MELD was primarily due to renal function, TIPS creation was performed on 09/05/21 which was uncomplicated.  A 7+2 cm Viatorr was placed and balloon molded to 8 mm with mean portosystemic gradient decrease from 14 to 7 mmHg.    He reports feeling much better.  He last paracentesis was on 10/22/21, yielding only 1.6 L.  He endorses no hepatic encephalopathy and remains compliant with lactulose PRN.  Prior lower extremity swelling has totally resolved. No chest pain or shortness of breath.  He remains on furosemide 20 mg BID and spironolactone 50 mg QD.  He's overall very happy with his progress. He does complain of some instances of light headedness/dizziness, which he thinks may be dehydration from his diuretic medications.    Past Medical History:  Diagnosis Date   Allergic reaction 11/21/2020   Cirrhosis (HCC)    Hyponatremia    Myofasciitis 11/21/2020   Osteomyelitis of right leg (HCC) 11/21/2020   Septic arthritis (HCC)    Streptococcus infection, group A 11/21/2020    Past Surgical History:  Procedure Laterality Date   BIOPSY  02/15/2021   Procedure: BIOPSY;  Surgeon: Jenel Lucks, MD;  Location: [facility] ENDOSCOPY;  Service: Gastroenterology;;   ESOPHAGOGASTRODUODENOSCOPY N/A 02/15/2021   Procedure: ESOPHAGOGASTRODUODENOSCOPY (EGD);  Surgeon: Jenel Lucks, MD;  Location: [facility] ENDOSCOPY;  Service: Gastroenterology;  Laterality: N/A;   ESOPHAGOGASTRODUODENOSCOPY (EGD) WITH PROPOFOL N/A 03/19/2021   Procedure: ESOPHAGOGASTRODUODENOSCOPY (EGD) WITH PROPOFOL;  Surgeon: Imogene Burn, MD;  Location: [facility] ENDOSCOPY;   Service: Gastroenterology;  Laterality: N/A;   HEMOSTASIS CLIP PLACEMENT  03/19/2021   Procedure: HEMOSTASIS CLIP PLACEMENT;  Surgeon: Imogene Burn, MD;  Location: [facility] ENDOSCOPY;  Service: Gastroenterology;;   I & D EXTREMITY Right 10/09/2020   Procedure: IRRIGATION AND DEBRIDEMENT EXTREMITY;  Surgeon: Yolonda Kida, MD;  Location: [facility] ORS;  Service: Orthopedics;  Laterality: Right;   IR IVUS EACH ADDITIONAL NON CORONARY VESSEL  09/05/2021   IR PARACENTESIS  03/18/2021   IR PARACENTESIS  04/09/2021   IR PARACENTESIS  04/22/2021   IR PARACENTESIS  05/02/2021   IR PARACENTESIS  05/16/2021   IR PARACENTESIS  05/29/2021   IR PARACENTESIS  06/13/2021   IR PARACENTESIS  06/25/2021   IR PARACENTESIS  07/04/2021   IR PARACENTESIS  07/16/2021   IR PARACENTESIS  07/25/2021   IR PARACENTESIS  07/31/2021   IR PARACENTESIS  08/06/2021   IR PARACENTESIS  08/14/2021   IR PARACENTESIS  08/21/2021   IR PARACENTESIS  08/29/2021   IR PARACENTESIS  09/05/2021   IR PARACENTESIS  09/24/2021   IR PARACENTESIS  10/07/2021   IR PARACENTESIS  10/23/2021   IR RADIOLOGIST EVAL & MGMT  08/11/2021   IR RADIOLOGIST EVAL & MGMT  10/14/2021   IR TIPS  09/05/2021   IR US GUIDE VASC ACCESS RIGHT  09/05/2021   KNEE ARTHROSCOPY Right 09/27/2020   Procedure: ARTHROSCOPY KNEE, WASH OUT;  Surgeon: Venita Lick, MD;  Location: [facility] ORS;  Service: Orthopedics;  Laterality: Right;   KNEE ARTHROSCOPY Right 10/09/2020   Procedure: ARTHROSCOPY KNEE,ARTHROSCOPIC I & D, CALF ABSCESS;  Surgeon: Yolonda Kida, MD;  Location:  [facility] ORS;  Service: Orthopedics;  Laterality: Right;   RADIOLOGY WITH ANESTHESIA N/A 09/05/2021   Procedure: TIPS;  Surgeon: Suzette Battiest, MD;  Location: [place];  Service: Radiology;  Laterality: N/A;    Allergies: Latex, Tape, Amoxicillin, Cephalexin, and Penicillins  Medications: Prior to Admission medications   Medication Sig Start Date End Date Taking? Authorizing Provider  furosemide (LASIX) 20 MG tablet Take 1  tablet (20 mg total) by mouth 2 (two) times daily. 12/05/21   Daryel November, MD  lactulose (CHRONULAC) 10 GM/15ML solution Take 30 mLs (20 g total) by mouth 3 (three) times daily. 09/07/21   Allred, Darrell K, PA-C  lactulose, encephalopathy, (CHRONULAC) 10 GM/15ML SOLN Take 30 mLs (20 g total) by mouth 2 (two) times daily. 12/04/21   Daryel November, MD  midodrine (PROAMATINE) 10 MG tablet Take 1 tablet (10 mg total) by mouth 3 (three) times daily with meals. 10/03/21   Daryel November, MD  oxyCODONE (OXY IR/ROXICODONE) 5 MG immediate release tablet Take 1 tablet (5 mg total) by mouth every 6 (six) hours as needed for moderate pain. 09/07/21   Allred, Darrell K, PA-C  pantoprazole (PROTONIX) 40 MG tablet Take 1 tablet (40 mg total) by mouth 2 (two) times daily. 10/03/21 09/28/22  Daryel November, MD  spironolactone (ALDACTONE) 25 MG tablet Take 2 tablets (50 mg total) by mouth 2 (two) times daily. Patient taking differently: Take 25 mg by mouth 2 (two) times daily. 06/25/21   Daryel November, MD     Family History  Problem Relation Age of Onset   Hepatitis Mother    Cirrhosis Mother    Stroke Father    Heart failure Father    Colon cancer Father    Kidney failure Brother    Liver cancer Brother    Stomach cancer Neg Hx    Esophageal cancer Neg Hx     Social History   Socioeconomic History   Marital status: Divorced    Spouse name: Not on file   Number of children: 2   Years of education: Not on file   Highest education level: Not on file  Occupational History   Not on file  Tobacco Use   Smoking status: Never   Smokeless tobacco: Never  Vaping Use   Vaping Use: Never used  Substance and Sexual Activity   Alcohol use: Not Currently   Drug use: Not Currently   Sexual activity: Not Currently  Other Topics Concern   Not on file  Social History Narrative   Not on file   Social Determinants of Health   Financial Resource Strain: Not on file  Food Insecurity:  Not on file  Transportation Needs: Not on file  Physical Activity: Not on file  Stress: Not on file  Social Connections: Not on file     Vital Signs: There were no vitals taken for this visit.  No physical examination was performed in lieu of virtual telephone clinic visit.  Imaging: TIPS 09/05/21   Labs:  CBC: Recent Labs    09/01/21 1533 09/05/21 1457 09/06/21 0316 09/07/21 0104 10/07/21 1505  WBC 7.1  --  6.4 19.8* 5.5  HGB 8.7* 8.8* 7.4* 7.2* 7.9*  HCT 26.8* 26.0* 21.8* 21.9* 22.9*  PLT 140*  --  121* 132* 129*    COAGS: Recent Labs    03/28/21 1848 04/04/21 1558 04/17/21 1708 05/14/21 1546 07/24/21 1547 09/05/21 1200 09/06/21 0316  INR 1.7*   < > 2.1* 1.8*  1.3* 1.3* 1.7*  APTT 42*  --  51*  --   --   --   --    < > = values in this interval not displayed.    BMP: Recent Labs    09/01/21 1533 09/05/21 1457 09/06/21 0316 09/07/21 0104 10/07/21 1505  NA 131* 133* 130* 129* 131*  K 4.1 3.9 3.9 4.2 3.6  CL 106  --  104 105 103  CO2 19*  --  19* 19* 21*  GLUCOSE 126*  --  148* 119* 93  BUN 24*  --  25* 25* 13  CALCIUM 8.5*  --  8.5* 8.7* 8.1*  CREATININE 1.92*  --  1.82* 1.63* 1.43*  GFRNONAA 40*  --  42* 48* 56*    LIVER FUNCTION TESTS: Recent Labs    09/01/21 1533 09/06/21 0316 09/07/21 0104 10/07/21 1505  BILITOT 1.1 1.6* 1.5* 2.1*  AST 40 67* 68* 38  ALT 18 25 32 17  ALKPHOS 124 73 91 177*  PROT 6.6 5.6* 5.7* 5.8*  ALBUMIN 2.8* 2.6* 2.7* 2.3*    Assessment and Plan: Kyle Pitts is a 61 y.o. male with history of alcoholic cirrhosis and recurrent ascites (Child Pugh B, MELD 22-->19 post TIPS) with a degree of hepatorenal syndrome affecting maximization of diuresis.  Given that elevated MELD was primarily due to renal function, TIPS creation was performed on 09/05/21 which was uncomplicated.  He continues to recover very well without encephalopathy and resolution of prior lower extremity third spacing.  He may be over diuresed at  this point given intermittent episodes of dizziness/lightheadedness.     -Decrease furosemide from 20 mg BID to 20 mg in the AM only.  Continue spironolactone.  Will discuss with Dr. Tomasa Rand. -Continue lactulose PRN. -Follow up in 3 months.   Marliss Coots, MD Pager: [phone] Clinic: [phone]    I spent a total of 25 Minutes in virtual telephone clinical consultation, greater than 50% of which was counseling/coordinating care for portal hypertension

## 2021-12-18 ENCOUNTER — Telehealth: Payer: Self-pay | Admitting: Gastroenterology

## 2021-12-18 ENCOUNTER — Other Ambulatory Visit: Payer: Self-pay

## 2021-12-18 MED ORDER — MIDODRINE HCL 10 MG PO TABS: 10.0000 mg | ORAL_TABLET | Freq: Three times a day (TID) | ORAL | 0 refills | Status: DC

## 2021-12-18 NOTE — Telephone Encounter (Signed)
Patient called states he need refill on his midodrine 10 mg. States he would to pick up the medication tomorrow. States pharmacy will be close over the weekend. Thank you

## 2022-01-19 ENCOUNTER — Ambulatory Visit (INDEPENDENT_AMBULATORY_CARE_PROVIDER_SITE_OTHER): Payer: Medicaid Other | Admitting: Gastroenterology

## 2022-01-19 ENCOUNTER — Other Ambulatory Visit (INDEPENDENT_AMBULATORY_CARE_PROVIDER_SITE_OTHER): Payer: Medicaid Other

## 2022-01-19 ENCOUNTER — Encounter: Payer: Self-pay | Admitting: Gastroenterology

## 2022-01-19 VITALS — BP 126/78 | HR 68 | Ht 72.0 in | Wt 209.0 lb

## 2022-01-19 DIAGNOSIS — K7030 Alcoholic cirrhosis of liver without ascites: Secondary | ICD-10-CM | POA: Diagnosis not present

## 2022-01-19 DIAGNOSIS — Z8711 Personal history of peptic ulcer disease: Secondary | ICD-10-CM | POA: Diagnosis not present

## 2022-01-19 DIAGNOSIS — D649 Anemia, unspecified: Secondary | ICD-10-CM

## 2022-01-19 DIAGNOSIS — K7031 Alcoholic cirrhosis of liver with ascites: Secondary | ICD-10-CM | POA: Diagnosis not present

## 2022-01-19 DIAGNOSIS — K7682 Hepatic encephalopathy: Secondary | ICD-10-CM

## 2022-01-19 DIAGNOSIS — Z95828 Presence of other vascular implants and grafts: Secondary | ICD-10-CM | POA: Diagnosis not present

## 2022-01-19 DIAGNOSIS — Z800 Family history of malignant neoplasm of digestive organs: Secondary | ICD-10-CM

## 2022-01-19 LAB — IBC + FERRITIN
Ferritin: 57.9 ng/mL (ref 22.0–322.0)
Iron: 223 ug/dL — ABNORMAL HIGH (ref 42–165)
Saturation Ratios: 95.4 % — ABNORMAL HIGH (ref 20.0–50.0)
TIBC: 233.8 ug/dL — ABNORMAL LOW (ref 250.0–450.0)
Transferrin: 167 mg/dL — ABNORMAL LOW (ref 212.0–360.0)

## 2022-01-19 LAB — CBC WITH DIFFERENTIAL/PLATELET
Basophils Absolute: 0 10*3/uL (ref 0.0–0.1)
Basophils Relative: 0.9 % (ref 0.0–3.0)
Eosinophils Absolute: 0.4 10*3/uL (ref 0.0–0.7)
Eosinophils Relative: 8.4 % — ABNORMAL HIGH (ref 0.0–5.0)
HCT: 25.9 % — ABNORMAL LOW (ref 39.0–52.0)
Hemoglobin: 9 g/dL — ABNORMAL LOW (ref 13.0–17.0)
Lymphocytes Relative: 30.3 % (ref 12.0–46.0)
Lymphs Abs: 1.5 10*3/uL (ref 0.7–4.0)
MCHC: 34.6 g/dL (ref 30.0–36.0)
MCV: 100.1 fl — ABNORMAL HIGH (ref 78.0–100.0)
Monocytes Absolute: 1 10*3/uL (ref 0.1–1.0)
Monocytes Relative: 19.9 % — ABNORMAL HIGH (ref 3.0–12.0)
Neutro Abs: 2 10*3/uL (ref 1.4–7.7)
Neutrophils Relative %: 40.5 % — ABNORMAL LOW (ref 43.0–77.0)
Platelets: 122 10*3/uL — ABNORMAL LOW (ref 150.0–400.0)
RBC: 2.59 Mil/uL — ABNORMAL LOW (ref 4.22–5.81)
RDW: 17.9 % — ABNORMAL HIGH (ref 11.5–15.5)
WBC: 5 10*3/uL (ref 4.0–10.5)

## 2022-01-19 LAB — BASIC METABOLIC PANEL
BUN: 16 mg/dL (ref 6–23)
CO2: 22 mEq/L (ref 19–32)
Calcium: 8.9 mg/dL (ref 8.4–10.5)
Chloride: 102 mEq/L (ref 96–112)
Creatinine, Ser: 1.02 mg/dL (ref 0.40–1.50)
GFR: 80.27 mL/min (ref >60.00)
Glucose, Bld: 85 mg/dL (ref 70–99)
Potassium: 4.5 mEq/L (ref 3.5–5.1)
Sodium: 127 mEq/L — ABNORMAL LOW (ref 135–145)

## 2022-01-19 LAB — VITAMIN B12: Vitamin B-12: 1293 pg/mL — ABNORMAL HIGH (ref 211–911)

## 2022-01-19 LAB — PROTIME-INR
INR: 1.5 ratio — ABNORMAL HIGH (ref 0.8–1.0)
Prothrombin Time: 16.6 s — ABNORMAL HIGH (ref 9.6–13.1)

## 2022-01-19 LAB — HEPATIC FUNCTION PANEL
ALT: 20 U/L (ref 0–53)
AST: 45 U/L — ABNORMAL HIGH (ref 0–37)
Albumin: 2.7 g/dL — ABNORMAL LOW (ref 3.5–5.2)
Alkaline Phosphatase: 176 U/L — ABNORMAL HIGH (ref 39–117)
Bilirubin, Direct: 1 mg/dL — ABNORMAL HIGH (ref 0.0–0.3)
Total Bilirubin: 2.5 mg/dL — ABNORMAL HIGH (ref 0.2–1.2)
Total Protein: 6.3 g/dL (ref 6.0–8.3)

## 2022-01-19 LAB — VITAMIN D 25 HYDROXY (VIT D DEFICIENCY, FRACTURES): VITD: 28.57 ng/mL — ABNORMAL LOW (ref 30.00–100.00)

## 2022-01-19 LAB — FOLATE: Folate: 14.2 ng/mL (ref >5.9)

## 2022-01-19 NOTE — Patient Instructions (Addendum)
_______________________________________________________  If you are age 60 or older, your body mass index should be between 23-30. Your Body mass index is 28.35 kg/m. If this is out of the aforementioned range listed, please consider follow up with your Primary Care Provider.  If you are age 23 or younger, your body mass index should be between 19-25. Your Body mass index is 28.35 kg/m. If this is out of the aformentioned range listed, please consider follow up with your Primary Care Provider.   Your provider has requested that you go to the basement level for lab work before leaving today. Press "B" on the elevator. The lab is located at the first door on the left as you exit the elevator.   It has been recommended to you by your physician that you have a(n) EGD / Colonoscopy  completed. Per your request, we did not schedule the procedure(s) today. Please contact our office at [phone] should you decide to have the procedure completed. You will be scheduled for a pre-visit and procedure at that time.  You will be contacted by [facility] in the next 2 days to arrange a Ultrasound .  The number on your caller ID will be [phone], please answer when they call.  If you have not heard from them in 2 days please call [phone] to schedule.    The [place] [facility] providers would like to encourage you to use [facility] to communicate with providers for non-urgent requests or questions.  Due to long hold times on the telephone, sending your provider a message by [facility] may be a faster and more efficient way to get a response.  Please allow 48 business hours for a response.  Please remember that this is for non-urgent requests.   It was a pleasure to see you today!  Thank you for trusting me with your gastrointestinal care!    Scott E.Candis Schatz, MD

## 2022-01-19 NOTE — Progress Notes (Unsigned)
HPI : Kyle Pitts is a very pleasant 61 year old male with a history of decompensated alcoholic cirrhosis with refractory ascites, nonbleeding esophageal varices and hepatic encephalopathy and bleeding peptic ulcers who presents to our clinic for routine follow-up.  He was last seen in our clinic in April, at which time he was referred for a TIPS procedure for his refractory ascites.  He underwent a successful TIPS in May and has been doing well since then.  His ascites appears to have resolved.  His lower extremity edema is much improved.  He has not required any paracentesis for several months now.  He is still taking a low-dose of Lasix 20 mg daily and periodically takes Aldactone.  He does still continue to take midodrine twice a day.  He admits he is not strict with his low-sodium diet, but does try to limit excess salt intake. Overall he thinks he is doing very well.  He still has problems with decreased stamina and issues with his knee that limit his activity.  He denies feeling short of breath.  He has maintained his abstinence since his admission last October. Reassuringly, he has not had any problems with confusion or encephalopathy since his admission in November 2022.  The TIPS does not appear to have exacerbated his encephalopathy.  He does take his lactulose, but more on an as-needed basis and not every day.  He usually has 1 or 2 bowel movements per day.  He will take his lactulose if he has not had a bowel movement by 11 AM.  His stools are formed and solid.  He is not seeing any blood in the stool.  No melanic stool.  He denies any problems with abdominal pain, nausea or vomiting.  His appetite is good.  His weight is stable. Still having knee problems Sister lives with him (he is primary caretaker)  He is taking Protonix as needed for indigestion symptoms.  He denies heartburn or acid regurgitation. He does admit to taking ibuprofen very rarely for knee pain.    Pertinent GI  history: Admitted October 26 through 30, 2022 with jaundice, melenic stools and anemia.  Diagnosed with alcoholic cirrhosis, no encephalopathy at that time, trace ascites not amenable to paracentesis.  Total bilirubin up to 22.  Not treated for alcoholic hepatitis given absence of abdominal pain, nausea, anorexia.  Started on propranolol for variceal bleeding prophylaxis.  EGD February 15, 2021 (Dr. Candis Schatz): Grade 2 varices, gastric ulcer (clean-based), 2 duodenal ulcers (clean-based), gastric antral vascular ectasia  Admitted November 26 through March 22, 2021 for hepatic encephalopathy/confusion.  Ammonia up to 156 upon admission, improved to 56 the next day.  Active GI bleed from peptic ulcers.  Acute renal injury concerning for HRS.  Large volume ascites, negative for SBP.  Last drink was prior to his October admission, but he had continued to take NSAIDs for his joint pains Discharged on lactulose, midodrine, pantoprazole: Diuretics held due to renal dysfunction.  Beta-blockers also held due to low BP  EGD March 19, 2021 (Dr. Lorenso Courier): Grade 2 varices, portal hypertensive gastropathy, many oozing cratered gastric ulcers with adherent clot in the gastric antrum, 2 hemoclips placed, gastric antral vascular ectasia   Admitted December 16 through 22, 2022 for worsening abdominal distention and shortness of breath, found to have tense ascites, acute kidney injury and positive COVID test.  Underwent therapeutic paracentesis of 8 L on December 17 and 5 L on December 21.  No SBP.  Discharged on Aldactone. Acute kidney injury  treated with midodrine, IV octreotide and albumin with improvement in renal function back to baseline  Office visit May 14, 2021 Abstinent still from alcohol since October Large ascites on exam on Lasix 40 and Aldactone 50 Patient put on list for EGD and colonoscopy in hospital, but never performed  Office visit August 12, 2021 Patient had been requiring frequent  large-volume paracentesis at least twice a month.  Diuresis limited by renal insufficiency Patient scheduled for EGD and colonoscopy in the [place], not performed  Patient underwent successful TIPS placement Sep 05, 2021 by Dr. Tacey Ruiz scores Feb 08, 2021:  34 Mar 15, 2021:  35 May 14, 2021:  25 (MELD 21) July 24, 2021:   22 (MELD 16)  Past Medical History:  Diagnosis Date   Allergic reaction 11/21/2020   Cirrhosis ([place])    Hyponatremia    Myofasciitis 11/21/2020   Osteomyelitis of right leg ([place]) 11/21/2020   Septic arthritis ([place])    Streptococcus infection, group A 11/21/2020   TTE Aug 27, 2021 EF 60 to 65%  Past Surgical History:  Procedure Laterality Date   BIOPSY  02/15/2021   Procedure: BIOPSY;  Surgeon: Daryel November, MD;  Location: [facility] ENDOSCOPY;  Service: Gastroenterology;;   ESOPHAGOGASTRODUODENOSCOPY N/A 02/15/2021   Procedure: ESOPHAGOGASTRODUODENOSCOPY (EGD);  Surgeon: Daryel November, MD;  Location: [place];  Service: Gastroenterology;  Laterality: N/A;   ESOPHAGOGASTRODUODENOSCOPY (EGD) WITH PROPOFOL N/A 03/19/2021   Procedure: ESOPHAGOGASTRODUODENOSCOPY (EGD) WITH PROPOFOL;  Surgeon: Sharyn Creamer, MD;  Location: [place];  Service: Gastroenterology;  Laterality: N/A;   HEMOSTASIS CLIP PLACEMENT  03/19/2021   Procedure: HEMOSTASIS CLIP PLACEMENT;  Surgeon: Sharyn Creamer, MD;  Location: [facility] ENDOSCOPY;  Service: Gastroenterology;;   I & D EXTREMITY Right 10/09/2020   Procedure: IRRIGATION AND DEBRIDEMENT EXTREMITY;  Surgeon: Nicholes Stairs, MD;  Location: [facility]S;  Service: Orthopedics;  Laterality: Right;   IR IVUS EACH ADDITIONAL NON CORONARY VESSEL  09/05/2021   IR PARACENTESIS  03/18/2021   IR PARACENTESIS  04/09/2021   IR PARACENTESIS  04/22/2021   IR PARACENTESIS  05/02/2021   IR PARACENTESIS  05/16/2021   IR PARACENTESIS  05/29/2021   IR PARACENTESIS  06/13/2021   IR PARACENTESIS  06/25/2021   IR PARACENTESIS  07/04/2021   IR  PARACENTESIS  07/16/2021   IR PARACENTESIS  07/25/2021   IR PARACENTESIS  07/31/2021   IR PARACENTESIS  08/06/2021   IR PARACENTESIS  08/14/2021   IR PARACENTESIS  08/21/2021   IR PARACENTESIS  08/29/2021   IR PARACENTESIS  09/05/2021   IR PARACENTESIS  09/24/2021   IR PARACENTESIS  10/07/2021   IR PARACENTESIS  10/23/2021   IR RADIOLOGIST EVAL & MGMT  08/11/2021   IR RADIOLOGIST EVAL & MGMT  10/14/2021   IR RADIOLOGIST EVAL & MGMT  12/15/2021   IR TIPS  09/05/2021   IR US GUIDE VASC ACCESS RIGHT  09/05/2021   KNEE ARTHROSCOPY Right 09/27/2020   Procedure: ARTHROSCOPY KNEE, [place] OUT;  Surgeon: Melina Schools, MD;  Location: [facility];  Service: Orthopedics;  Laterality: Right;   KNEE ARTHROSCOPY Right 10/09/2020   Procedure: ARTHROSCOPY KNEE,ARTHROSCOPIC I & D, CALF ABSCESS;  Surgeon: Nicholes Stairs, MD;  Location: [facility];  Service: Orthopedics;  Laterality: Right;   RADIOLOGY WITH ANESTHESIA N/A 09/05/2021   Procedure: TIPS;  Surgeon: Suzette Battiest, MD;  Location: [place];  Service: Radiology;  Laterality: N/A;   Family History  Problem Relation Age of Onset  Hepatitis Mother    Cirrhosis Mother    Stroke Father    Heart failure Father    Colon cancer Father    Kidney failure Brother    Liver cancer Brother    Stomach cancer Neg Hx    Esophageal cancer Neg Hx    Social History   Tobacco Use   Smoking status: Never   Smokeless tobacco: Never  Vaping Use   Vaping Use: Never used  Substance Use Topics   Alcohol use: Not Currently   Drug use: Not Currently   Current Outpatient Medications  Medication Sig Dispense Refill   furosemide (LASIX) 20 MG tablet Take 1 tablet (20 mg total) by mouth 2 (two) times daily. 120 tablet 1   lactulose (CHRONULAC) 10 GM/15ML solution Take 30 mLs (20 g total) by mouth 3 (three) times daily. 236 mL 0   lactulose, encephalopathy, (CHRONULAC) 10 GM/15ML SOLN Take 30 mLs (20 g total) by mouth 2 (two) times daily. 946 mL 3   midodrine (PROAMATINE) 10 MG tablet  Take 1 tablet (10 mg total) by mouth 3 (three) times daily with meals. 90 tablet 0   oxyCODONE (OXY IR/ROXICODONE) 5 MG immediate release tablet Take 1 tablet (5 mg total) by mouth every 6 (six) hours as needed for moderate pain. 20 tablet 0   pantoprazole (PROTONIX) 40 MG tablet Take 1 tablet (40 mg total) by mouth 2 (two) times daily. 180 tablet 0   spironolactone (ALDACTONE) 25 MG tablet Take 2 tablets (50 mg total) by mouth 2 (two) times daily. (Patient taking differently: Take 25 mg by mouth 2 (two) times daily.) 120 tablet 3   No current facility-administered medications for this visit.   Allergies  Allergen Reactions   Latex Itching   Tape Itching   Amoxicillin Itching    Itching around eyes and scalp on amoxicilin   Cephalexin Nausea Only    Upset stomach Ceftriaxone is ok   Penicillins Rash     Review of Systems: All systems reviewed and negative except where noted in HPI.    No results found.  Physical Exam: BP 126/78   Pulse 68   Ht 6' (1.829 m)   Wt 209 lb (94.8 kg)   BMI 28.35 kg/m  Constitutional: Pleasant,well-developed, Caucasian male in no acute distress. HEENT: Normocephalic and atraumatic. Conjunctivae are normal. No scleral icterus.  Numerous missing teeth Neck supple.  Cardiovascular: Normal rate, regular rhythm.  Pulmonary/chest: Effort normal and breath sounds normal. No wheezing, rales or rhonchi. Abdominal: Soft, nondistended, nontender. Bowel sounds active throughout. There are no masses palpable. No hepatomegaly. Extremities: no edema, right knee swollen slightly warm to touch.  No erythema Neurological: Alert and oriented to person place and time.  No asterixis Skin: Skin is warm and dry. No rashes noted.  Not jaundiced Psychiatric: Normal mood and affect. Behavior is normal.  CBC    Component Value Date/Time   WBC 5.5 10/07/2021 1505   RBC 2.42 (L) 10/07/2021 1505   HGB 7.9 (L) 10/07/2021 1505   HCT 22.9 (L) 10/07/2021 1505   PLT 129 (L)  10/07/2021 1505   MCV 94.6 10/07/2021 1505   MCH 32.6 10/07/2021 1505   MCHC 34.5 10/07/2021 1505   RDW 15.8 (H) 10/07/2021 1505   LYMPHSABS 1.1 09/07/2021 0104   MONOABS 1.8 (H) 09/07/2021 0104   EOSABS 0.0 09/07/2021 0104   BASOSABS 0.0 09/07/2021 0104    CMP     Component Value Date/Time   NA 131 (L) 10/07/2021  1505   K 3.6 10/07/2021 1505   CL 103 10/07/2021 1505   CO2 21 (L) 10/07/2021 1505   GLUCOSE 93 10/07/2021 1505   BUN 13 10/07/2021 1505   CREATININE 1.43 (H) 10/07/2021 1505   CREATININE 0.52 (L) 11/21/2020 1535   CALCIUM 8.1 (L) 10/07/2021 1505   PROT 5.8 (L) 10/07/2021 1505   ALBUMIN 2.3 (L) 10/07/2021 1505   AST 38 10/07/2021 1505   ALT 17 10/07/2021 1505   ALKPHOS 177 (H) 10/07/2021 1505   BILITOT 2.1 (H) 10/07/2021 1505   GFRNONAA 56 (L) 10/07/2021 1505   GFRAA  05/13/2010 1718    >60        The eGFR has been calculated using the MDRD equation. This calculation has not been validated in all clinical situations. eGFR's persistently <60 mL/min signify possible Chronic Kidney Disease.     ASSESSMENT AND PLAN: 61 year old male with decompensated alcoholic cirrhosis with refractory ascites now status post TIPS, history of hepatic encephalopathy and nonbleeding esophageal varices presenting for routine follow-up.  He has been abstinent from alcohol for almost a year now and has had significant clinical improvement in terms of his hepatic and renal dysfunction. The TIPS procedure has been very effective for managing his ascites, and thankfully he has not had any exacerbations of his hepatic encephalopathy.  He is currently on a low-dose Lasix and intermittent Aldactone regimen.  I am hopeful we can maybe discontinue the Aldactone completely, based on his electrolytes today. He has been taking his lactulose on an as-needed basis for the last several months.  He appears to be doing well with this, so I have no problem with him continuing this regimen. He has a  history of bleeding gastric and duodenal ulcers.  We have not been able to reexamine his stomach to ensure resolution of his gastric ulcers.  We will schedule that for him today.  I am also hopeful that his varices will have improved since his TIPS. He has never had a colonoscopy.  His father was diagnosed with colon cancer in his 40s.  We will schedule him for a colonoscopy as well. 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  We will recheck a CBC today.  We can consider treating his GAVE with APC if his anemia continues to be a problem. He is due for [facility] screening with an ultrasound. He would be interested in a liver transplant if found to be a suitable candidate.    Alcoholic cirrhosis with ascites s/p TIPs, HE, esophageal varices (nonbleeding) - Ultrasound for HCC screening, AFP - Updated MELD labs - Continue Lasix 20 mg p.o. daily, Aldactone 50 mg daily: Consider stopping Aldactone based on electrolyte levels today - Continue lactulose as needed as he has been doing - Refer to transplant program based on MELD score - No need for nonselective beta-blockers given absence of variceal bleeding and status post TIPS - Okay to continue midodrine for now  History of peptic ulcer disease (gastric and duodenal ulcers) with hemorrhage -Repeat EGD to assess healing of gastric ulcer - Continue Protonix daily indefinitely - Continue to avoid NSAIDs indefinitely  Family history of colon cancer, no prior history of colonoscopy - Colonoscopy  Anemia, multifactorial - Repeat CBC - Iron panel, B12/folate - Consider scheduling for EGD in hospital with APC ablation of GAVE if labs consistent with severe iron deficiency anemia  Follow-up  in office in 6 months  The details, risks (including bleeding, perforation, infection, missed lesions, medication reactions  and possible hospitalization or surgery if complications occur), benefits, and alternatives to EGD/colonoscopy with possible biopsy and possible polypectomy were discussed with the patient and he consents to proceed.   Maryiah Olvey E. Candis Schatz, MD [facility]   No ref. provider found

## 2022-01-21 LAB — AFP TUMOR MARKER: AFP-Tumor Marker: 8.6 ng/mL — ABNORMAL HIGH (ref <6.1)

## 2022-01-22 ENCOUNTER — Other Ambulatory Visit: Payer: Self-pay

## 2022-01-22 ENCOUNTER — Telehealth: Payer: Self-pay | Admitting: Gastroenterology

## 2022-01-22 DIAGNOSIS — K7031 Alcoholic cirrhosis of liver with ascites: Secondary | ICD-10-CM

## 2022-01-22 MED ORDER — FUROSEMIDE 20 MG PO TABS: 20.0000 mg | ORAL_TABLET | Freq: Two times a day (BID) | ORAL | 1 refills | Status: DC

## 2022-01-22 NOTE — Telephone Encounter (Signed)
Patient called to request refill on Lasik

## 2022-01-22 NOTE — Telephone Encounter (Signed)
Refill sent to patients pharmacy. 

## 2022-01-22 NOTE — Progress Notes (Signed)
Kyle Pitts,  Overall your labs were pretty stable from previous.  Your kidney function is much improved with the lower diuretic dose, but your sodium level is quite low.  Furosemide(lasix) can sometimes cause this.  Given the absence of ascites and swelling, I would recommend you hold the lasix for now. I would like to repeat labs in 2 weeks and see what your electrolytes look like and see how your swelling does.   Although counterintuitive, increasing your dietary salt consumption will NOT increase your blood sodium level and will only make your swelling worse.  It is very important to continue to follow a low sodium diet.  Your MELD score seems to be settling out at around 15.  I think it would be very reasonable for you to meet with a transplant program and get established.  We will place a referral to the [facility] program.  Your tumor marker (AFP) was slightly elevated.  We will see what your ultrasound shows.  If no concerning mass seen, I would like to repeat an AFP in 3 months.  If it is increasing, we should get an MRI.  Your anemia improved a bit which is most likely related to the improvement in your kidney function.  Your vitamin d level was very slightly low.  I would recommend you take an over the counter vitamin D supplement.  Vaughan Basta,  Can you please contact Kyle Pitts and advise him to hold his lasix and aldactone and repeat BMP in 2 weeks? Also place referral for [facility] for consideration for liver transplant.  Thanks

## 2022-01-29 ENCOUNTER — Ambulatory Visit (HOSPITAL_COMMUNITY): Payer: Medicaid Other

## 2022-01-29 ENCOUNTER — Ambulatory Visit (HOSPITAL_COMMUNITY)
Admission: RE | Admit: 2022-01-29 | Discharge: 2022-01-29 | Disposition: A | Discharge status: Home / Self Care | Payer: Medicaid Other | Source: Ambulatory Visit | Attending: Gastroenterology | Admitting: Gastroenterology

## 2022-01-29 DIAGNOSIS — K7031 Alcoholic cirrhosis of liver with ascites: Secondary | ICD-10-CM | POA: Insufficient documentation

## 2022-02-02 ENCOUNTER — Other Ambulatory Visit: Payer: Self-pay | Admitting: Gastroenterology

## 2022-02-03 ENCOUNTER — Other Ambulatory Visit: Payer: Self-pay | Admitting: Interventional Radiology

## 2022-02-03 DIAGNOSIS — K7031 Alcoholic cirrhosis of liver with ascites: Secondary | ICD-10-CM

## 2022-02-04 NOTE — Progress Notes (Signed)
Kyle Pitts, Your ultrasound looked good.  There were no concerning masses or lesions concerning for liver cancer.  There is no mention of any fluid in your abdomen.  This indicates that the TIPS is working very well.  Given your low sodium levels, in the absence of ascites, I would recommend you continue to hold off on the Lasix.  Please remember to go to the lab for the repeat blood test to recheck your sodium level. You do have gallstones, but given your absence of symptoms and your significant operative risk from cirrhosis, there is no role for surgery.  Kyle Pitts, Please relay the above message to Kyle Pitts

## 2022-02-13 ENCOUNTER — Other Ambulatory Visit (INDEPENDENT_AMBULATORY_CARE_PROVIDER_SITE_OTHER): Payer: Medicaid Other

## 2022-02-13 ENCOUNTER — Encounter: Payer: Self-pay | Admitting: Gastroenterology

## 2022-02-13 DIAGNOSIS — K7031 Alcoholic cirrhosis of liver with ascites: Secondary | ICD-10-CM | POA: Diagnosis not present

## 2022-02-13 LAB — BASIC METABOLIC PANEL
BUN: 18 mg/dL (ref 6–23)
CO2: 22 mEq/L (ref 19–32)
Calcium: 8.4 mg/dL (ref 8.4–10.5)
Chloride: 105 mEq/L (ref 96–112)
Creatinine, Ser: 1.04 mg/dL (ref 0.40–1.50)
GFR: 78.38 mL/min (ref >60.00)
Glucose, Bld: 115 mg/dL — ABNORMAL HIGH (ref 70–99)
Potassium: 3.8 mEq/L (ref 3.5–5.1)
Sodium: 130 mEq/L — ABNORMAL LOW (ref 135–145)

## 2022-02-16 NOTE — Progress Notes (Signed)
Kyle Pitts, Your sodium level was back to your baseline, which is still low, but acceptable.  I think it is okay for you to continue to use the lasix on an as needed basis, when you notice some swelling in your legs or abdomen, but you don't have to take it everyday.  You can stop taking the aldactone.    Let's check your electrolytes again in one month and see how your swelling is doing.  Vaughan Basta,  Can you please relay the above message and place an order for a BMP in 1 month?

## 2022-02-17 ENCOUNTER — Other Ambulatory Visit: Payer: Self-pay

## 2022-02-17 ENCOUNTER — Telehealth: Payer: Self-pay | Admitting: Gastroenterology

## 2022-02-17 DIAGNOSIS — K7031 Alcoholic cirrhosis of liver with ascites: Secondary | ICD-10-CM

## 2022-02-17 NOTE — Telephone Encounter (Signed)
Inbound call from patient returning call. Pease call back to further advise.   Thank you

## 2022-02-17 NOTE — Telephone Encounter (Signed)
See result note.  

## 2022-03-16 ENCOUNTER — Ambulatory Visit (AMBULATORY_SURGERY_CENTER): Payer: Self-pay | Admitting: *Deleted

## 2022-03-16 ENCOUNTER — Ambulatory Visit
Admission: RE | Admit: 2022-03-16 | Discharge: 2022-03-16 | Disposition: A | Discharge status: Home / Self Care | Payer: Medicaid Other | Source: Ambulatory Visit | Attending: Interventional Radiology | Admitting: Interventional Radiology

## 2022-03-16 VITALS — Ht 72.0 in | Wt 210.0 lb

## 2022-03-16 DIAGNOSIS — Z800 Family history of malignant neoplasm of digestive organs: Secondary | ICD-10-CM

## 2022-03-16 DIAGNOSIS — Z8711 Personal history of peptic ulcer disease: Secondary | ICD-10-CM

## 2022-03-16 DIAGNOSIS — K7031 Alcoholic cirrhosis of liver with ascites: Secondary | ICD-10-CM

## 2022-03-16 DIAGNOSIS — D509 Iron deficiency anemia, unspecified: Secondary | ICD-10-CM

## 2022-03-16 HISTORY — PX: IR RADIOLOGIST EVAL & MGMT: IMG5224

## 2022-03-16 MED ORDER — PLENVU 140 G PO SOLR: 1.0000 | Freq: Once | ORAL | 0 refills | Status: AC

## 2022-03-16 NOTE — Progress Notes (Signed)
Referring Physician(s): Dustin Flock, MD   Reason for Follow Up: Virtual telephone visit 6 months after TIPS creation   History of present illness: Kyle Pitts is a 61 y.o. male with history of alcoholic cirrhosis and recurrent ascites (Child Pugh B, MELD 22) with a degree of hepatorenal syndrome affecting maximization of diuresis.  Given that elevated MELD was primarily due to renal function, TIPS creation was performed on 09/05/21 which was uncomplicated.  A 7+2 cm Viatorr was placed and balloon molded to 8 mm with mean portosystemic gradient decrease from 14 to 7 mmHg.     He reports feeling much better.  He last paracentesis was on 10/23/21, yielding only 1.6 L.  He endorses no hepatic encephalopathy and remains compliant with lactulose PRN.  Prior lower extremity swelling has totally resolved. No chest pain or shortness of breath.  Prior dizziness resolved after cessation of diuretics.   Past Medical History:  Diagnosis Date   Allergic reaction 11/21/2020   Cirrhosis ([place])    Hyponatremia    Myofasciitis 11/21/2020   Osteomyelitis of right leg ([place]) 11/21/2020   Septic arthritis ([place])    Streptococcus infection, group A 11/21/2020    Past Surgical History:  Procedure Laterality Date   BIOPSY  02/15/2021   Procedure: BIOPSY;  Surgeon: Daryel November, MD;  Location: [facility];  Service: Gastroenterology;;   ESOPHAGOGASTRODUODENOSCOPY N/A 02/15/2021   Procedure: ESOPHAGOGASTRODUODENOSCOPY (EGD);  Surgeon: Daryel November, MD;  Location: [facility];  Service: Gastroenterology;  Laterality: N/A;   ESOPHAGOGASTRODUODENOSCOPY (EGD) WITH PROPOFOL N/A 03/19/2021   Procedure: ESOPHAGOGASTRODUODENOSCOPY (EGD) WITH PROPOFOL;  Surgeon: Sharyn Creamer, MD;  Location: [facility];  Service: Gastroenterology;  Laterality: N/A;   HEMOSTASIS CLIP PLACEMENT  03/19/2021   Procedure: HEMOSTASIS CLIP PLACEMENT;  Surgeon: Sharyn Creamer, MD;  [facility];   Service: Gastroenterology;;   I & D EXTREMITY Right 10/09/2020   Procedure: IRRIGATION AND DEBRIDEMENT EXTREMITY;  Surgeon: Nicholes Stairs, MD;  Location: [facility];  Service: Orthopedics;  Laterality: Right;   IR IVUS EACH ADDITIONAL NON CORONARY VESSEL  09/05/2021   IR PARACENTESIS  03/18/2021   IR PARACENTESIS  04/09/2021   IR PARACENTESIS  04/22/2021   IR PARACENTESIS  05/02/2021   IR PARACENTESIS  05/16/2021   IR PARACENTESIS  05/29/2021   IR PARACENTESIS  06/13/2021   IR PARACENTESIS  06/25/2021   IR PARACENTESIS  07/04/2021   IR PARACENTESIS  07/16/2021   IR PARACENTESIS  07/25/2021   IR PARACENTESIS  07/31/2021   IR PARACENTESIS  08/06/2021   IR PARACENTESIS  08/14/2021   IR PARACENTESIS  08/21/2021   IR PARACENTESIS  08/29/2021   IR PARACENTESIS  09/05/2021   IR PARACENTESIS  09/24/2021   IR PARACENTESIS  10/07/2021   IR PARACENTESIS  10/23/2021   IR RADIOLOGIST EVAL & MGMT  08/11/2021   IR RADIOLOGIST EVAL & MGMT  10/14/2021   IR RADIOLOGIST EVAL & MGMT  12/15/2021   IR TIPS  09/05/2021   IR US GUIDE VASC ACCESS RIGHT  09/05/2021   KNEE ARTHROSCOPY Right 09/27/2020   Procedure: ARTHROSCOPY KNEE, [place] OUT;  Surgeon: Melina Schools, MD;  Location: [facility];  Service: Orthopedics;  Laterality: Right;   KNEE ARTHROSCOPY Right 10/09/2020   Procedure: ARTHROSCOPY KNEE,ARTHROSCOPIC I & D, CALF ABSCESS;  Surgeon: Nicholes Stairs, MD;  Location: [facility];  Service: Orthopedics;  Laterality: Right;   RADIOLOGY WITH ANESTHESIA N/A 09/05/2021   Procedure: TIPS;  Surgeon: Serafina Royals,  Rosanne Ashing, MD;  Location: [place];  Service: Radiology;  Laterality: N/A;    Allergies: Latex, Tape, Amoxicillin, Cephalexin, and Penicillins  Medications: Prior to Admission medications   Medication Sig Start Date End Date Taking? Authorizing Provider  furosemide (LASIX) 20 MG tablet Take 1 tablet (20 mg total) by mouth 2 (two) times daily. 01/22/22   Daryel November, MD  lactulose (CHRONULAC) 10 GM/15ML solution Take 30 mLs (20  g total) by mouth 3 (three) times daily. 09/07/21   Allred, Darrell K, PA-C  lactulose, encephalopathy, (CHRONULAC) 10 GM/15ML SOLN Take 30 mLs (20 g total) by mouth 2 (two) times daily. 12/04/21   Daryel November, MD  midodrine (PROAMATINE) 10 MG tablet Take 1 tablet (10 mg total) by mouth 3 (three) times daily with meals. 12/18/21   Daryel November, MD  oxyCODONE (OXY IR/ROXICODONE) 5 MG immediate release tablet Take 1 tablet (5 mg total) by mouth every 6 (six) hours as needed for moderate pain. 09/07/21   Allred, Darrell K, PA-C  pantoprazole (PROTONIX) 40 MG tablet TAKE 1 TABLET(40 MG) BY MOUTH TWICE DAILY 02/02/22   Daryel November, MD  spironolactone (ALDACTONE) 25 MG tablet Take 2 tablets (50 mg total) by mouth 2 (two) times daily. Patient taking differently: Take 25 mg by mouth 2 (two) times daily. 06/25/21   Daryel November, MD     Family History  Problem Relation Age of Onset   Hepatitis Mother    Cirrhosis Mother    Stroke Father    Heart failure Father    Colon cancer Father    Kidney failure Brother    Liver cancer Brother    Stomach cancer Neg Hx    Esophageal cancer Neg Hx     Social History   Socioeconomic History   Marital status: Divorced    Spouse name: Not on file   Number of children: 2   Years of education: Not on file   Highest education level: Not on file  Occupational History   Not on file  Tobacco Use   Smoking status: Never   Smokeless tobacco: Never  Vaping Use   Vaping Use: Never used  Substance and Sexual Activity   Alcohol use: Not Currently   Drug use: Not Currently   Sexual activity: Not Currently  Other Topics Concern   Not on file  Social History Narrative   Not on file   Social Determinants of Health   Financial Resource Strain: Not on file  Food Insecurity: Not on file  Transportation Needs: Not on file  Physical Activity: Not on file  Stress: Not on file  Social Connections: Not on file     Vital Signs: There  were no vitals taken for this visit.  No physical examination was performed in lieu of virtual telephone clinic visit.   Imaging: TIPS 09/05/21   Labs:  CBC: Recent Labs    09/06/21 0316 09/07/21 0104 10/07/21 1505 01/19/22 1051  WBC 6.4 19.8* 5.5 5.0  HGB 7.4* 7.2* 7.9* 9.0*  HCT 21.8* 21.9* 22.9* 25.9*  PLT 121* 132* 129* 122.0*    COAGS: Recent Labs    03/28/21 1848 04/04/21 1558 04/17/21 1708 05/14/21 1546 07/24/21 1547 09/05/21 1200 09/06/21 0316 01/19/22 1051  INR 1.7*   < > 2.1*   < > 1.3* 1.3* 1.7* 1.5*  APTT 42*  --  51*  --   --   --   --   --    < > =  values in this interval not displayed.    BMP: Recent Labs    09/01/21 1533 09/05/21 1457 09/06/21 0316 09/07/21 0104 10/07/21 1505 01/19/22 1051 02/13/22 1026  NA 131*   < > 130* 129* 131* 127* 130*  K 4.1   < > 3.9 4.2 3.6 4.5 3.8  CL 106  --  104 105 103 102 105  CO2 19*  --  19* 19* 21* 22 22  GLUCOSE 126*  --  148* 119* 93 85 115*  BUN 24*  --  25* 25* 13 16 18   CALCIUM 8.5*  --  8.5* 8.7* 8.1* 8.9 8.4  CREATININE 1.92*  --  1.82* 1.63* 1.43* 1.02 1.04  GFRNONAA 40*  --  42* 48* 56*  --   --    < > = values in this interval not displayed.    LIVER FUNCTION TESTS: Recent Labs    09/06/21 0316 09/07/21 0104 10/07/21 1505 01/19/22 1051  BILITOT 1.6* 1.5* 2.1* 2.5*  AST 67* 68* 38 45*  ALT 25 32 17 20  ALKPHOS 73 91 177* 176*  PROT 5.6* 5.7* 5.8* 6.3  ALBUMIN 2.6* 2.7* 2.3* 2.7*    Assessment and Plan: DAXX TIGGS is a 61 y.o. male with history of alcoholic cirrhosis and recurrent ascites (Child Pugh B, MELD 22-->19-->15 post TIPS) with a degree of hepatorenal syndrome affecting maximization of diuresis.  Given that elevated MELD was primarily due to renal function, TIPS creation was performed on 09/05/21 which was uncomplicated.  He continues to recover very well without encephalopathy and resolution of prior lower extremity third spacing.  He is now off diuretics.    -Continue lactulose PRN. -Follow up in 6 months with CBC, CMP, INR.  09/07/21, MD Pager: [phone] Clinic: [phone] ;;   I spent a total of 25 Minutes in virtual telephone clinical consultation, greater than 50% of which was counseling/coordinating care for portal hypertension.

## 2022-03-16 NOTE — Progress Notes (Signed)

## 2022-03-19 ENCOUNTER — Telehealth: Payer: Self-pay

## 2022-03-19 NOTE — Telephone Encounter (Signed)
-----   Message from Chrystie Nose, RN sent at 02/17/2022  1:33 PM EDT ----- Regarding: BMP Pt needs lab in 

## 2022-03-19 NOTE — Telephone Encounter (Signed)
Noted  

## 2022-03-19 NOTE — Telephone Encounter (Signed)
Patient returning your call. I advised him of labs that are due and to come to the basement in our office at his earliest convenience. Patient verbalized understanding, no questions at the end of the call.

## 2022-03-19 NOTE — Telephone Encounter (Signed)
Left message for pt on his identified voicemail that he is due for labs, orders in epic.

## 2022-04-01 ENCOUNTER — Encounter: Payer: Self-pay | Admitting: Gastroenterology

## 2022-04-01 ENCOUNTER — Other Ambulatory Visit (INDEPENDENT_AMBULATORY_CARE_PROVIDER_SITE_OTHER): Payer: Medicaid Other

## 2022-04-01 ENCOUNTER — Ambulatory Visit (AMBULATORY_SURGERY_CENTER): Payer: Medicaid Other | Admitting: Gastroenterology

## 2022-04-01 VITALS — BP 100/55 | HR 86 | Temp 99.3°F | Resp 15 | Ht 72.0 in | Wt 210.0 lb

## 2022-04-01 DIAGNOSIS — K633 Ulcer of intestine: Secondary | ICD-10-CM

## 2022-04-01 DIAGNOSIS — I8500 Esophageal varices without bleeding: Secondary | ICD-10-CM | POA: Diagnosis not present

## 2022-04-01 DIAGNOSIS — D509 Iron deficiency anemia, unspecified: Secondary | ICD-10-CM | POA: Diagnosis not present

## 2022-04-01 DIAGNOSIS — Z8711 Personal history of peptic ulcer disease: Secondary | ICD-10-CM

## 2022-04-01 DIAGNOSIS — K2950 Unspecified chronic gastritis without bleeding: Secondary | ICD-10-CM | POA: Diagnosis not present

## 2022-04-01 DIAGNOSIS — Z800 Family history of malignant neoplasm of digestive organs: Secondary | ICD-10-CM | POA: Diagnosis not present

## 2022-04-01 DIAGNOSIS — K5289 Other specified noninfective gastroenteritis and colitis: Secondary | ICD-10-CM | POA: Diagnosis not present

## 2022-04-01 DIAGNOSIS — K2970 Gastritis, unspecified, without bleeding: Secondary | ICD-10-CM

## 2022-04-01 DIAGNOSIS — K5190 Ulcerative colitis, unspecified, without complications: Secondary | ICD-10-CM | POA: Diagnosis not present

## 2022-04-01 LAB — CBC
HCT: 27.6 % — ABNORMAL LOW (ref 39.0–52.0)
Hemoglobin: 9.5 g/dL — ABNORMAL LOW (ref 13.0–17.0)
MCHC: 34.5 g/dL (ref 30.0–36.0)
MCV: 101.6 fl — ABNORMAL HIGH (ref 78.0–100.0)
Platelets: 129 10*3/uL — ABNORMAL LOW (ref 150.0–400.0)
RBC: 2.71 Mil/uL — ABNORMAL LOW (ref 4.22–5.81)
RDW: 15.9 % — ABNORMAL HIGH (ref 11.5–15.5)
WBC: 6.6 10*3/uL (ref 4.0–10.5)

## 2022-04-01 LAB — COMPREHENSIVE METABOLIC PANEL
ALT: 20 U/L (ref 0–53)
AST: 48 U/L — ABNORMAL HIGH (ref 0–37)
Albumin: 2.9 g/dL — ABNORMAL LOW (ref 3.5–5.2)
Alkaline Phosphatase: 186 U/L — ABNORMAL HIGH (ref 39–117)
BUN: 10 mg/dL (ref 6–23)
CO2: 21 mEq/L (ref 19–32)
Calcium: 8.8 mg/dL (ref 8.4–10.5)
Chloride: 106 mEq/L (ref 96–112)
Creatinine, Ser: 0.93 mg/dL (ref 0.40–1.50)
GFR: 89.55 mL/min (ref >60.00)
Glucose, Bld: 87 mg/dL (ref 70–99)
Potassium: 4 mEq/L (ref 3.5–5.1)
Sodium: 133 mEq/L — ABNORMAL LOW (ref 135–145)
Total Bilirubin: 5.1 mg/dL — ABNORMAL HIGH (ref 0.2–1.2)
Total Protein: 6.5 g/dL (ref 6.0–8.3)

## 2022-04-01 MED ORDER — MELOXICAM 7.5 MG PO TABS: 7.5000 mg | ORAL_TABLET | Freq: Two times a day (BID) | ORAL | 0 refills | Status: DC | PRN

## 2022-04-01 MED ORDER — SODIUM CHLORIDE 0.9 % IV SOLN: 500.0000 mL | Freq: Once | INTRAVENOUS | Status: DC

## 2022-04-01 NOTE — Progress Notes (Signed)
[facility] History and Physical   Primary Care Physician:  Pcp, No   Reason for Procedure:   Follow up gastric/duodenal ulcers, colon cancer screening (high risk)  Plan:    EGD, colonoscopy     HPI: Kyle Pitts is a 61 y.o. male with a history of decompensated alcohol-induced cirrhosis and history of bleeding peptic ulcers (gastric and duodenal) undergoing repeat EGD to assess healing of ulcers.  He also has a history of refractory ascites and varices.  Ascites markedly improved following TIPS earlier this year.   His father was diagnosed with colon cancer in his 25s.  He has never had a colonoscopy.    Past Medical History:  Diagnosis Date   Allergic reaction 11/21/2020   Anemia    DENIES   Asthma    "SLIGHT"   Blood transfusion without reported diagnosis    Cataract    LEFT EYE,RIGHT EYE REMOVED   Cirrhosis ([facility])    Hyponatremia    Myofasciitis 11/21/2020   Osteomyelitis of right leg ([facility]) 11/21/2020   Septic arthritis ([facility])    Streptococcus infection, group A 11/21/2020    Past Surgical History:  Procedure Laterality Date   BIOPSY  02/15/2021   Procedure: BIOPSY;  Surgeon: Jenel Lucks, MD;  Location: [facility] ENDOSCOPY;  Service: Gastroenterology;;   ESOPHAGOGASTRODUODENOSCOPY N/A 02/15/2021   Procedure: ESOPHAGOGASTRODUODENOSCOPY (EGD);  Surgeon: Jenel Lucks, MD;  Location: [facility] ENDOSCOPY;  Service: Gastroenterology;  Laterality: N/A;   ESOPHAGOGASTRODUODENOSCOPY (EGD) WITH PROPOFOL N/A 03/19/2021   Procedure: ESOPHAGOGASTRODUODENOSCOPY (EGD) WITH PROPOFOL;  Surgeon: Imogene Burn, MD;  Location: [facility] ENDOSCOPY;  Service: Gastroenterology;  Laterality: N/A;   HEMOSTASIS CLIP PLACEMENT  03/19/2021   Procedure: HEMOSTASIS CLIP PLACEMENT;  Surgeon: Imogene Burn, MD;  Location: [facility] ENDOSCOPY;  Service: Gastroenterology;;   I & D EXTREMITY Right 10/09/2020   Procedure: IRRIGATION AND DEBRIDEMENT EXTREMITY;  Surgeon: Yolonda Kida, MD;   Location: [facility];  Service: Orthopedics;  Laterality: Right;   IR IVUS EACH ADDITIONAL NON CORONARY VESSEL  09/05/2021   IR PARACENTESIS  03/18/2021   IR PARACENTESIS  04/09/2021   IR PARACENTESIS  04/22/2021   IR PARACENTESIS  05/02/2021   IR PARACENTESIS  05/16/2021   IR PARACENTESIS  05/29/2021   IR PARACENTESIS  06/13/2021   IR PARACENTESIS  06/25/2021   IR PARACENTESIS  07/04/2021   IR PARACENTESIS  07/16/2021   IR PARACENTESIS  07/25/2021   IR PARACENTESIS  07/31/2021   IR PARACENTESIS  08/06/2021   IR PARACENTESIS  08/14/2021   IR PARACENTESIS  08/21/2021   IR PARACENTESIS  08/29/2021   IR PARACENTESIS  09/05/2021   IR PARACENTESIS  09/24/2021   IR PARACENTESIS  10/07/2021   IR PARACENTESIS  10/23/2021   IR RADIOLOGIST EVAL & MGMT  08/11/2021   IR RADIOLOGIST EVAL & MGMT  10/14/2021   IR RADIOLOGIST EVAL & MGMT  12/15/2021   IR RADIOLOGIST EVAL & MGMT  03/16/2022   IR TIPS  09/05/2021   IR US GUIDE VASC ACCESS RIGHT  09/05/2021   KNEE ARTHROSCOPY Right 09/27/2020   Procedure: ARTHROSCOPY KNEE, [place] OUT;  Surgeon: Venita Lick, MD;  Location: [facility];  Service: Orthopedics;  Laterality: Right;   KNEE ARTHROSCOPY Right 10/09/2020   Procedure: ARTHROSCOPY KNEE,ARTHROSCOPIC I & D, CALF ABSCESS;  Surgeon: Yolonda Kida, MD;  Location: [facility];  Service: Orthopedics;  Laterality: Right;   RADIOLOGY WITH ANESTHESIA N/A 09/05/2021   Procedure: TIPS;  Surgeon: Bennie Dallas, MD;  Location: [facility];  Service: Radiology;  Laterality: N/A;    Prior to Admission medications   Medication Sig Start Date End Date Taking? Authorizing Provider  midodrine (PROAMATINE) 10 MG tablet Take 1 tablet (10 mg total) by mouth 3 (three) times daily with meals. 12/18/21  Yes Jenel Lucks, MD  furosemide (LASIX) 20 MG tablet Take 1 tablet (20 mg total) by mouth 2 (two) times daily. Patient not taking: Reported on 04/01/2022 01/22/22   Jenel Lucks, MD  lactulose (CHRONULAC) 10 GM/15ML solution Take 30 mLs (20 g  total) by mouth 3 (three) times daily. Patient not taking: Reported on 03/16/2022 09/07/21   Allred, Darrell K, PA-C  lactulose, encephalopathy, (CHRONULAC) 10 GM/15ML SOLN Take 30 mLs (20 g total) by mouth 2 (two) times daily. Patient not taking: Reported on 04/01/2022 12/04/21   Jenel Lucks, MD  oxyCODONE (OXY IR/ROXICODONE) 5 MG immediate release tablet Take 1 tablet (5 mg total) by mouth every 6 (six) hours as needed for moderate pain. Patient not taking: Reported on 03/16/2022 09/07/21   Allred, Darrell K, PA-C  pantoprazole (PROTONIX) 40 MG tablet TAKE 1 TABLET(40 MG) BY MOUTH TWICE DAILY Patient not taking: Reported on 03/16/2022 02/02/22   Jenel Lucks, MD  spironolactone (ALDACTONE) 25 MG tablet Take 2 tablets (50 mg total) by mouth 2 (two) times daily. Patient taking differently: Take 25 mg by mouth 2 (two) times daily. 06/25/21   Jenel Lucks, MD    Current Outpatient Medications  Medication Sig Dispense Refill   midodrine (PROAMATINE) 10 MG tablet Take 1 tablet (10 mg total) by mouth 3 (three) times daily with meals. 90 tablet 0   furosemide (LASIX) 20 MG tablet Take 1 tablet (20 mg total) by mouth 2 (two) times daily. (Patient not taking: Reported on 04/01/2022) 120 tablet 1   lactulose (CHRONULAC) 10 GM/15ML solution Take 30 mLs (20 g total) by mouth 3 (three) times daily. (Patient not taking: Reported on 03/16/2022) 236 mL 0   lactulose, encephalopathy, (CHRONULAC) 10 GM/15ML SOLN Take 30 mLs (20 g total) by mouth 2 (two) times daily. (Patient not taking: Reported on 04/01/2022) 946 mL 3   oxyCODONE (OXY IR/ROXICODONE) 5 MG immediate release tablet Take 1 tablet (5 mg total) by mouth every 6 (six) hours as needed for moderate pain. (Patient not taking: Reported on 03/16/2022) 20 tablet 0   pantoprazole (PROTONIX) 40 MG tablet TAKE 1 TABLET(40 MG) BY MOUTH TWICE DAILY (Patient not taking: Reported on 03/16/2022) 180 tablet 0   spironolactone (ALDACTONE) 25 MG tablet  Take 2 tablets (50 mg total) by mouth 2 (two) times daily. (Patient taking differently: Take 25 mg by mouth 2 (two) times daily.) 120 tablet 3   Current Facility-Administered Medications  Medication Dose Route Frequency Provider Last Rate Last Admin   0.9 %  sodium chloride infusion  500 mL Intravenous Once Jenel Lucks, MD        Allergies as of 04/01/2022 - Review Complete 04/01/2022  Allergen Reaction Noted   Latex Itching 04/04/2021   Tape Itching 04/04/2021   Amoxicillin Itching 11/21/2020   Cephalexin Nausea Only 01/13/2007   Penicillins Rash 01/13/2007    Family History  Problem Relation Age of Onset   Hepatitis Mother    Cirrhosis Mother    Stroke Father    Heart failure Father    Colon cancer Father    Kidney failure Brother    Liver cancer Brother    Stomach cancer Neg Hx  Esophageal cancer Neg Hx    Colon polyps Neg Hx    Crohn's disease Neg Hx    Rectal cancer Neg Hx    Ulcerative colitis Neg Hx     Social History   Socioeconomic History   Marital status: Divorced    Spouse name: Not on file   Number of children: 2   Years of education: Not on file   Highest education level: Not on file  Occupational History   Not on file  Tobacco Use   Smoking status: Never    Passive exposure: Never   Smokeless tobacco: Never  Vaping Use   Vaping Use: Never used  Substance and Sexual Activity   Alcohol use: Not Currently   Drug use: Not Currently   Sexual activity: Not Currently  Other Topics Concern   Not on file  Social History Narrative   Not on file   Social Determinants of Health   Financial Resource Strain: Not on file  Food Insecurity: Not on file  Transportation Needs: Not on file  Physical Activity: Not on file  Stress: Not on file  Social Connections: Not on file  Intimate Partner Violence: Not on file    Review of Systems:  All other review of systems negative except as mentioned in the HPI.  Physical Exam: Vital signs BP (!)  111/48   Pulse 71   Temp 99.3 F (37.4 C) (Temporal)   Ht 6' (1.829 m)   Wt 210 lb (95.3 kg)   SpO2 100%   BMI 28.48 kg/m   General:   Alert,  Well-developed, well-nourished, pleasant and cooperative in NAD Airway:  Mallampati 1 Lungs:  Clear throughout to auscultation.   Heart:  Regular rate and rhythm; no murmurs, clicks, rubs,  or gallops. Abdomen:  Soft, nontender and nondistended. Normal bowel sounds.   Neuro/Psych:  Normal mood and affect. A and O x 3   Kosha Jaquith E. Tomasa Rand, MD [facility]

## 2022-04-01 NOTE — Progress Notes (Signed)
Pt's states no medical or surgical changes since previsit or office visit. 

## 2022-04-01 NOTE — Patient Instructions (Signed)
YOU HAD AN ENDOSCOPIC PROCEDURE TODAY AT THE  ENDOSCOPY CENTER:   Refer to the procedure report that was given to you for any specific questions about what was found during the examination.  If the procedure report does not answer your questions, please call your gastroenterologist to clarify.  If you requested that your care partner not be given the details of your procedure findings, then the procedure report has been included in a sealed envelope for you to review at your convenience later.  YOU SHOULD EXPECT: Some feelings of bloating in the abdomen. Passage of more gas than usual.  Walking can help get rid of the air that was put into your GI tract during the procedure and reduce the bloating. If you had a lower endoscopy (such as a colonoscopy or flexible sigmoidoscopy) you may notice spotting of blood in your stool or on the toilet paper. If you underwent a bowel prep for your procedure, you may not have a normal bowel movement for a few days.  Please Note:  You might notice some irritation and congestion in your nose or some drainage.  This is from the oxygen used during your procedure.  There is no need for concern and it should clear up in a day or so.  SYMPTOMS TO REPORT IMMEDIATELY:  Following lower endoscopy (colonoscopy or flexible sigmoidoscopy):  Excessive amounts of blood in the stool  Significant tenderness or worsening of abdominal pains  Swelling of the abdomen that is new, acute  Fever of 100F or higher  Following upper endoscopy (EGD)  Vomiting of blood or coffee ground material  New chest pain or pain under the shoulder blades  Painful or persistently difficult swallowing  New shortness of breath  Fever of 100F or higher  Black, tarry-looking stools  For urgent or emergent issues, a gastroenterologist can be reached at any hour by calling [phone]. Do not use MyChart messaging for urgent concerns.    DIET:  We do recommend a small meal at first, but  then you may proceed to your regular diet.  Drink plenty of fluids but you should avoid alcoholic beverages for 24 hours.  ACTIVITY:  You should plan to take it easy for the rest of today and you should NOT DRIVE or use heavy machinery until tomorrow (because of the sedation medicines used during the test).    FOLLOW UP: Our staff will call the number listed on your records the next business day following your procedure.  We will call around 7:15- 8:00 am to check on you and address any questions or concerns that you may have regarding the information given to you following your procedure. If we do not reach you, we will leave a message.     If any biopsies were taken you will be contacted by phone or by letter within the next 1-3 weeks.  Please call us at [phone] if you have not heard about the biopsies in 3 weeks.    SIGNATURES/CONFIDENTIALITY: You and/or your care partner have signed paperwork which will be entered into your electronic medical record.  These signatures attest to the fact that that the information above on your After Visit Summary has been reviewed and is understood.  Full responsibility of the confidentiality of this discharge information lies with you and/or your care-partner.  

## 2022-04-01 NOTE — Op Note (Signed)
[facility] Patient Name: Kyle Pitts Procedure Date: 04/01/2022 11:55 AM MRN: [id number] Endoscopist: Lorin Picket E. Tomasa Rand , MD, [id number] Age: 61 Referring MD:  Date of Birth: 1960/07/16 Gender: Male Account #: [id number] Procedure:                Upper GI endoscopy Indications:              Follow-up of gastric ulcer with hemorrhage. Patient                            has history of decompensated cirrhosis with UGIB                            secondary gastric/duodenal ulcers, Grade II                            varices, PHG, HE, and refractory ascites, now s/p                            TIPS with controlled ascites. EGD to assess healing                            of peptic ulcers. Medicines:                Monitored Anesthesia Care Procedure:                Pre-Anesthesia Assessment:                           - Prior to the procedure, a History and Physical                            was performed, and patient medications and                            allergies were reviewed. The patient's tolerance of                            previous anesthesia was also reviewed. The risks                            and benefits of the procedure and the sedation                            options and risks were discussed with the patient.                            All questions were answered, and informed consent                            was obtained. Prior Anticoagulants: The patient has                            taken no anticoagulant or antiplatelet agents. ASA  Grade Assessment: III - A patient with severe                            systemic disease. After reviewing the risks and                            benefits, the patient was deemed in satisfactory                            condition to undergo the procedure.                           After obtaining informed consent, the endoscope was                            passed under direct  vision. Throughout the                            procedure, the patient's blood pressure, pulse, and                            oxygen saturations were monitored continuously. The                            GIF [id number] #[id number] was introduced through the                            mouth, and advanced to the second part of duodenum.                            The upper GI endoscopy was accomplished without                            difficulty. The patient tolerated the procedure                            well. Scope In: Scope Out: Findings:                 The examined portions of the nasopharynx,                            oropharynx and larynx were normal.                           Grade I varices were found in the lower third of                            the esophagus. They were diminutive in size.                           The exam of the esophagus was otherwise normal.                           A single localized erosion  with no bleeding and no                            stigmata of recent bleeding was found in the                            prepyloric region of the stomach. Biopsies were                            taken with a cold forceps for Helicobacter pylori                            testing. Estimated blood loss was minimal.                           The exam of the stomach was otherwise normal.                           The examined duodenum was normal. Complications:            No immediate complications. Estimated Blood Loss:     Estimated blood loss was minimal. Impression:               - The examined portions of the nasopharynx,                            oropharynx and larynx were normal.                           - Grade I esophageal varices.                           - Gastric erosion with no bleeding and no stigmata                            of recent bleeding. Biopsied.                           - Normal examined duodenum. Recommendation:           - Patient  has a contact number available for                            emergencies. The signs and symptoms of potential                            delayed complications were discussed with the                            patient. Return to normal activities tomorrow.                            Written discharge instructions were provided to the                            patient.                           -  Resume previous diet.                           - Continue present medications.                           - Await pathology results.                           - No need for further endoscopic surveillance Cheron Coryell E. Tomasa Randunningham, MD 04/01/2022 12:35:21 PM This report has been signed electronically.

## 2022-04-01 NOTE — Progress Notes (Signed)
Sedate, gd SR, tolerated procedure well, VSS, report to RN 

## 2022-04-01 NOTE — Op Note (Signed)
[facility] Patient Name: Kyle Pitts Procedure Date: 04/01/2022 11:54 AM MRN: [id number] Endoscopist: Lorin Picket E. Tomasa Rand , MD, [id number] Age: 61 Referring MD:  Date of Birth: 05-Nov-1960 Gender: Male Account #: [id number] Procedure:                Colonoscopy Indications:              Screening in patient at increased risk: Colorectal                            cancer in father 74 or older Medicines:                Monitored Anesthesia Care Procedure:                Pre-Anesthesia Assessment:                           - Prior to the procedure, a History and Physical                            was performed, and patient medications and                            allergies were reviewed. The patient's tolerance of                            previous anesthesia was also reviewed. The risks                            and benefits of the procedure and the sedation                            options and risks were discussed with the patient.                            All questions were answered, and informed consent                            was obtained. Prior Anticoagulants: The patient has                            taken no anticoagulant or antiplatelet agents. ASA                            Grade Assessment: III - A patient with severe                            systemic disease. After reviewing the risks and                            benefits, the patient was deemed in satisfactory                            condition to undergo the procedure.  After obtaining informed consent, the colonoscope                            was passed under direct vision. Throughout the                            procedure, the patient's blood pressure, pulse, and                            oxygen saturations were monitored continuously. The                            CF [id number] #[id number] was introduced through the anus                            and advanced to  the the cecum, identified by                            appendiceal orifice and ileocecal valve. The                            colonoscopy was performed without difficulty. The                            patient tolerated the procedure well. The quality                            of the bowel preparation was excellent. The                            ileocecal valve, appendiceal orifice, and rectum                            were photographed. The bowel preparation used was                            SUPREP via split dose instruction. Scope In: 12:12:00 PM Scope Out: 12:25:57 PM Scope Withdrawal Time: 0 hours 9 minutes 43 seconds  Total Procedure Duration: 0 hours 13 minutes 57 seconds  Findings:                 The perianal and digital rectal examinations were                            normal. Pertinent negatives include normal                            sphincter tone and no palpable rectal lesions.                           A single medium-sized angioectasia without bleeding                            was found in the cecum.  A single (solitary) five mm superficial linear                            ulcer was found at the hepatic flexure. No bleeding                            was present. No stigmata of recent bleeding were                            seen. Biopsies were taken with a cold forceps for                            histology. Estimated blood loss was minimal.                           The exam was otherwise normal throughout the                            examined colon.                           Non-bleeding internal hemorrhoids were found during                            retroflexion. The hemorrhoids were Grade I                            (internal hemorrhoids that do not prolapse).                           No additional abnormalities were found on                            retroflexion. Complications:            No immediate  complications. Estimated Blood Loss:     Estimated blood loss was minimal. Impression:               - A single non-bleeding colonic angioectasia.                           - A single (solitary) ulcer at the hepatic flexure,                            likely of limited clinical significance. Biopsied.                           - Non-bleeding internal hemorrhoids. Recommendation:           - Patient has a contact number available for                            emergencies. The signs and symptoms of potential                            delayed complications were discussed with the  patient. Return to normal activities tomorrow.                            Written discharge instructions were provided to the                            patient.                           - Resume previous diet.                           - Continue present medications.                           - Await pathology results.                           - Repeat colonoscopy in 10 years for screening                            purposes. Virgle Arth E. Tomasa Randunningham, MD 04/01/2022 12:41:02 PM This report has been signed electronically.

## 2022-04-01 NOTE — Progress Notes (Signed)
Called to room to assist during endoscopic procedure.  Patient ID and intended procedure confirmed with present staff. Received instructions for my participation in the procedure from the performing physician.  

## 2022-04-02 ENCOUNTER — Telehealth: Payer: Self-pay | Admitting: *Deleted

## 2022-04-02 LAB — AFP TUMOR MARKER: AFP-Tumor Marker: 6.9 ng/mL — ABNORMAL HIGH (ref <6.1)

## 2022-04-02 NOTE — Telephone Encounter (Signed)
No answer for post procedure callback. Unable to leave VM. 

## 2022-04-06 ENCOUNTER — Encounter: Payer: Self-pay | Admitting: Gastroenterology

## 2022-04-06 NOTE — Progress Notes (Signed)
Bonita Quin, Can you please let Mr. Kant know that his electrolytes and kidney function looked okay, but his bilirubin has increased quite a bit (from 2.5-5.1), which can be an indicator of progression of liver disease. Also, his anemia has not improved as much as I would have thought since his kidneys have recovered. I would recommend he start taking a daily iron tablet to help with his anemia. Also, can you confirm that he has an appointment with [facility] hepatology scheduled?

## 2022-04-07 ENCOUNTER — Other Ambulatory Visit: Payer: Self-pay

## 2022-04-07 MED ORDER — IRON (FERROUS SULFATE) 325 (65 FE) MG PO TABS: 325.0000 mg | ORAL_TABLET | Freq: Every day | ORAL | 6 refills | Status: DC

## 2022-04-21 ENCOUNTER — Other Ambulatory Visit: Payer: Self-pay | Admitting: Gastroenterology

## 2022-04-30 ENCOUNTER — Other Ambulatory Visit: Payer: Self-pay | Admitting: Gastroenterology

## 2022-04-30 ENCOUNTER — Telehealth: Payer: Self-pay | Admitting: Gastroenterology

## 2022-04-30 ENCOUNTER — Other Ambulatory Visit: Payer: Self-pay

## 2022-04-30 MED ORDER — MELOXICAM 7.5 MG PO TABS: 7.5000 mg | ORAL_TABLET | Freq: Two times a day (BID) | ORAL | 0 refills | Status: DC | PRN

## 2022-04-30 NOTE — Telephone Encounter (Signed)
Patient is calling states he needs a refill on his Mobic Rx would like it sent to [facility] on [address]. Please advise

## 2022-04-30 NOTE — Telephone Encounter (Signed)
Refill sent to patients pharmacy. 

## 2022-06-01 ENCOUNTER — Other Ambulatory Visit: Payer: Self-pay

## 2022-06-01 ENCOUNTER — Telehealth: Payer: Self-pay | Admitting: Gastroenterology

## 2022-06-01 MED ORDER — LACTULOSE 10 GM/15ML PO SOLN: 20.0000 g | Freq: Three times a day (TID) | ORAL | 0 refills | Status: DC

## 2022-06-01 NOTE — Telephone Encounter (Signed)
Inbound call from patient requesting a refill for Lactulose. Please advise.

## 2022-06-01 NOTE — Telephone Encounter (Signed)
Refill sent to patients pharmacy. 

## 2022-06-18 ENCOUNTER — Other Ambulatory Visit: Payer: Self-pay

## 2022-06-18 MED ORDER — LACTULOSE 10 GM/15ML PO SOLN: 20.0000 g | Freq: Three times a day (TID) | ORAL | 0 refills | Status: DC

## 2022-06-18 NOTE — Telephone Encounter (Signed)
Refill sent to patients pharmacy. 

## 2022-06-18 NOTE — Telephone Encounter (Signed)
Inbound call from patient requesting a refill for Lactulose. Please advise.

## 2022-06-29 ENCOUNTER — Inpatient Hospital Stay (HOSPITAL_COMMUNITY): Payer: Medicaid Other

## 2022-06-29 ENCOUNTER — Emergency Department (HOSPITAL_COMMUNITY): Payer: Medicaid Other

## 2022-06-29 ENCOUNTER — Inpatient Hospital Stay (HOSPITAL_COMMUNITY)
Admission: EM | Admit: 2022-06-29 | Discharge: 2022-07-20 | DRG: 871 | Disposition: E | Payer: Medicaid Other | Attending: Internal Medicine | Admitting: Internal Medicine

## 2022-06-29 ENCOUNTER — Inpatient Hospital Stay: Payer: Self-pay

## 2022-06-29 DIAGNOSIS — I2489 Other forms of acute ischemic heart disease: Secondary | ICD-10-CM | POA: Diagnosis present

## 2022-06-29 DIAGNOSIS — A419 Sepsis, unspecified organism: Secondary | ICD-10-CM

## 2022-06-29 DIAGNOSIS — A4102 Sepsis due to Methicillin resistant Staphylococcus aureus: Secondary | ICD-10-CM | POA: Diagnosis present

## 2022-06-29 DIAGNOSIS — J95851 Ventilator associated pneumonia: Secondary | ICD-10-CM | POA: Diagnosis not present

## 2022-06-29 DIAGNOSIS — F1010 Alcohol abuse, uncomplicated: Secondary | ICD-10-CM | POA: Diagnosis present

## 2022-06-29 DIAGNOSIS — I4729 Other ventricular tachycardia: Secondary | ICD-10-CM | POA: Diagnosis not present

## 2022-06-29 DIAGNOSIS — G9341 Metabolic encephalopathy: Secondary | ICD-10-CM | POA: Diagnosis present

## 2022-06-29 DIAGNOSIS — N170 Acute kidney failure with tubular necrosis: Secondary | ICD-10-CM | POA: Diagnosis not present

## 2022-06-29 DIAGNOSIS — D689 Coagulation defect, unspecified: Secondary | ICD-10-CM | POA: Diagnosis not present

## 2022-06-29 DIAGNOSIS — I471 Supraventricular tachycardia, unspecified: Secondary | ICD-10-CM | POA: Diagnosis not present

## 2022-06-29 DIAGNOSIS — Z8711 Personal history of peptic ulcer disease: Secondary | ICD-10-CM

## 2022-06-29 DIAGNOSIS — J811 Chronic pulmonary edema: Secondary | ICD-10-CM | POA: Diagnosis present

## 2022-06-29 DIAGNOSIS — L97919 Non-pressure chronic ulcer of unspecified part of right lower leg with unspecified severity: Secondary | ICD-10-CM | POA: Diagnosis present

## 2022-06-29 DIAGNOSIS — Z9001 Acquired absence of eye: Secondary | ICD-10-CM

## 2022-06-29 DIAGNOSIS — Z8249 Family history of ischemic heart disease and other diseases of the circulatory system: Secondary | ICD-10-CM

## 2022-06-29 DIAGNOSIS — T68XXXA Hypothermia, initial encounter: Secondary | ICD-10-CM

## 2022-06-29 DIAGNOSIS — I8510 Secondary esophageal varices without bleeding: Secondary | ICD-10-CM | POA: Diagnosis present

## 2022-06-29 DIAGNOSIS — K7040 Alcoholic hepatic failure without coma: Secondary | ICD-10-CM | POA: Diagnosis present

## 2022-06-29 DIAGNOSIS — Z881 Allergy status to other antibiotic agents status: Secondary | ICD-10-CM

## 2022-06-29 DIAGNOSIS — Z1152 Encounter for screening for COVID-19: Secondary | ICD-10-CM

## 2022-06-29 DIAGNOSIS — I878 Other specified disorders of veins: Secondary | ICD-10-CM | POA: Diagnosis present

## 2022-06-29 DIAGNOSIS — Z801 Family history of malignant neoplasm of trachea, bronchus and lung: Secondary | ICD-10-CM

## 2022-06-29 DIAGNOSIS — N179 Acute kidney failure, unspecified: Secondary | ICD-10-CM

## 2022-06-29 DIAGNOSIS — K7030 Alcoholic cirrhosis of liver without ascites: Secondary | ICD-10-CM | POA: Diagnosis present

## 2022-06-29 DIAGNOSIS — R7881 Bacteremia: Secondary | ICD-10-CM | POA: Diagnosis not present

## 2022-06-29 DIAGNOSIS — I4719 Other supraventricular tachycardia: Secondary | ICD-10-CM | POA: Diagnosis present

## 2022-06-29 DIAGNOSIS — F1410 Cocaine abuse, uncomplicated: Secondary | ICD-10-CM | POA: Diagnosis present

## 2022-06-29 DIAGNOSIS — E162 Hypoglycemia, unspecified: Principal | ICD-10-CM

## 2022-06-29 DIAGNOSIS — I493 Ventricular premature depolarization: Secondary | ICD-10-CM | POA: Diagnosis present

## 2022-06-29 DIAGNOSIS — D684 Acquired coagulation factor deficiency: Secondary | ICD-10-CM | POA: Diagnosis present

## 2022-06-29 DIAGNOSIS — Z7189 Other specified counseling: Secondary | ICD-10-CM

## 2022-06-29 DIAGNOSIS — Z515 Encounter for palliative care: Secondary | ICD-10-CM | POA: Diagnosis not present

## 2022-06-29 DIAGNOSIS — J15212 Pneumonia due to Methicillin resistant Staphylococcus aureus: Secondary | ICD-10-CM | POA: Diagnosis present

## 2022-06-29 DIAGNOSIS — I10 Essential (primary) hypertension: Secondary | ICD-10-CM | POA: Diagnosis present

## 2022-06-29 DIAGNOSIS — R34 Anuria and oliguria: Secondary | ICD-10-CM | POA: Diagnosis present

## 2022-06-29 DIAGNOSIS — Z79899 Other long term (current) drug therapy: Secondary | ICD-10-CM

## 2022-06-29 DIAGNOSIS — E875 Hyperkalemia: Secondary | ICD-10-CM

## 2022-06-29 DIAGNOSIS — Z823 Family history of stroke: Secondary | ICD-10-CM

## 2022-06-29 DIAGNOSIS — B9562 Methicillin resistant Staphylococcus aureus infection as the cause of diseases classified elsewhere: Secondary | ICD-10-CM | POA: Diagnosis not present

## 2022-06-29 DIAGNOSIS — Z66 Do not resuscitate: Secondary | ICD-10-CM | POA: Diagnosis not present

## 2022-06-29 DIAGNOSIS — F1110 Opioid abuse, uncomplicated: Secondary | ICD-10-CM | POA: Diagnosis present

## 2022-06-29 DIAGNOSIS — I87331 Chronic venous hypertension (idiopathic) with ulcer and inflammation of right lower extremity: Secondary | ICD-10-CM | POA: Diagnosis present

## 2022-06-29 DIAGNOSIS — M009 Pyogenic arthritis, unspecified: Secondary | ICD-10-CM | POA: Diagnosis present

## 2022-06-29 DIAGNOSIS — R6521 Severe sepsis with septic shock: Secondary | ICD-10-CM | POA: Diagnosis present

## 2022-06-29 DIAGNOSIS — D539 Nutritional anemia, unspecified: Secondary | ICD-10-CM | POA: Diagnosis present

## 2022-06-29 DIAGNOSIS — Z880 Allergy status to penicillin: Secondary | ICD-10-CM

## 2022-06-29 DIAGNOSIS — I9589 Other hypotension: Secondary | ICD-10-CM | POA: Diagnosis present

## 2022-06-29 DIAGNOSIS — J9601 Acute respiratory failure with hypoxia: Secondary | ICD-10-CM | POA: Diagnosis present

## 2022-06-29 DIAGNOSIS — M00061 Staphylococcal arthritis, right knee: Secondary | ICD-10-CM | POA: Diagnosis not present

## 2022-06-29 DIAGNOSIS — E871 Hypo-osmolality and hyponatremia: Secondary | ICD-10-CM

## 2022-06-29 DIAGNOSIS — E872 Acidosis, unspecified: Secondary | ICD-10-CM | POA: Diagnosis present

## 2022-06-29 DIAGNOSIS — I472 Ventricular tachycardia, unspecified: Secondary | ICD-10-CM

## 2022-06-29 DIAGNOSIS — Z91040 Latex allergy status: Secondary | ICD-10-CM

## 2022-06-29 DIAGNOSIS — E8770 Fluid overload, unspecified: Secondary | ICD-10-CM | POA: Diagnosis present

## 2022-06-29 DIAGNOSIS — K766 Portal hypertension: Secondary | ICD-10-CM | POA: Diagnosis present

## 2022-06-29 DIAGNOSIS — L03115 Cellulitis of right lower limb: Secondary | ICD-10-CM | POA: Diagnosis present

## 2022-06-29 DIAGNOSIS — D688 Other specified coagulation defects: Secondary | ICD-10-CM | POA: Diagnosis present

## 2022-06-29 DIAGNOSIS — Z800 Family history of malignant neoplasm of digestive organs: Secondary | ICD-10-CM

## 2022-06-29 DIAGNOSIS — G9340 Encephalopathy, unspecified: Secondary | ICD-10-CM

## 2022-06-29 DIAGNOSIS — J45909 Unspecified asthma, uncomplicated: Secondary | ICD-10-CM | POA: Diagnosis present

## 2022-06-29 DIAGNOSIS — Z91048 Other nonmedicinal substance allergy status: Secondary | ICD-10-CM

## 2022-06-29 DIAGNOSIS — Y848 Other medical procedures as the cause of abnormal reaction of the patient, or of later complication, without mention of misadventure at the time of the procedure: Secondary | ICD-10-CM | POA: Diagnosis not present

## 2022-06-29 DIAGNOSIS — R680 Hypothermia, not associated with low environmental temperature: Secondary | ICD-10-CM | POA: Diagnosis present

## 2022-06-29 DIAGNOSIS — D6489 Other specified anemias: Secondary | ICD-10-CM | POA: Diagnosis present

## 2022-06-29 DIAGNOSIS — Z841 Family history of disorders of kidney and ureter: Secondary | ICD-10-CM

## 2022-06-29 DIAGNOSIS — E876 Hypokalemia: Secondary | ICD-10-CM | POA: Diagnosis present

## 2022-06-29 DIAGNOSIS — D65 Disseminated intravascular coagulation [defibrination syndrome]: Secondary | ICD-10-CM | POA: Diagnosis present

## 2022-06-29 LAB — COMPREHENSIVE METABOLIC PANEL
ALT: 23 U/L (ref 0–44)
AST: 93 U/L — ABNORMAL HIGH (ref 15–41)
Albumin: 1.9 g/dL — ABNORMAL LOW (ref 3.5–5.0)
Alkaline Phosphatase: 217 U/L — ABNORMAL HIGH (ref 38–126)
Anion gap: 16 — ABNORMAL HIGH (ref 5–15)
BUN: 61 mg/dL — ABNORMAL HIGH (ref 6–20)
CO2: 17 mmol/L — ABNORMAL LOW (ref 22–32)
Calcium: 7.1 mg/dL — ABNORMAL LOW (ref 8.9–10.3)
Chloride: 89 mmol/L — ABNORMAL LOW (ref 98–111)
Creatinine, Ser: 5.42 mg/dL — ABNORMAL HIGH (ref 0.61–1.24)
GFR, Estimated: 11 mL/min — ABNORMAL LOW (ref >60)
Glucose, Bld: 185 mg/dL — ABNORMAL HIGH (ref 70–99)
Potassium: 5.6 mmol/L — ABNORMAL HIGH (ref 3.5–5.1)
Sodium: 122 mmol/L — ABNORMAL LOW (ref 135–145)
Total Bilirubin: 7.9 mg/dL — ABNORMAL HIGH (ref 0.3–1.2)
Total Protein: 5.1 g/dL — ABNORMAL LOW (ref 6.5–8.1)

## 2022-06-29 LAB — RESP PANEL BY RT-PCR (RSV, FLU A&B, COVID)  RVPGX2
Influenza A by PCR: NEGATIVE
Influenza B by PCR: NEGATIVE
Resp Syncytial Virus by PCR: NEGATIVE
SARS Coronavirus 2 by RT PCR: NEGATIVE

## 2022-06-29 LAB — BASIC METABOLIC PANEL WITH GFR
Anion gap: 18 — ABNORMAL HIGH (ref 5–15)
BUN: 60 mg/dL — ABNORMAL HIGH (ref 6–20)
CO2: 14 mmol/L — ABNORMAL LOW (ref 22–32)
Calcium: 7 mg/dL — ABNORMAL LOW (ref 8.9–10.3)
Chloride: 94 mmol/L — ABNORMAL LOW (ref 98–111)
Creatinine, Ser: 5.26 mg/dL — ABNORMAL HIGH (ref 0.61–1.24)
GFR, Estimated: 12 mL/min — ABNORMAL LOW
Glucose, Bld: 95 mg/dL (ref 70–99)
Potassium: 5.3 mmol/L — ABNORMAL HIGH (ref 3.5–5.1)
Sodium: 126 mmol/L — ABNORMAL LOW (ref 135–145)

## 2022-06-29 LAB — I-STAT VENOUS BLOOD GAS, ED
Acid-base deficit: 7 mmol/L — ABNORMAL HIGH (ref 0.0–2.0)
Bicarbonate: 17.7 mmol/L — ABNORMAL LOW (ref 20.0–28.0)
Calcium, Ion: 0.87 mmol/L — CL (ref 1.15–1.40)
HCT: 25 % — ABNORMAL LOW (ref 39.0–52.0)
Hemoglobin: 8.5 g/dL — ABNORMAL LOW (ref 13.0–17.0)
O2 Saturation: 92 %
Potassium: 5.8 mmol/L — ABNORMAL HIGH (ref 3.5–5.1)
Sodium: 121 mmol/L — ABNORMAL LOW (ref 135–145)
TCO2: 19 mmol/L — ABNORMAL LOW (ref 22–32)
pCO2, Ven: 31.6 mmHg — ABNORMAL LOW (ref 44–60)
pH, Ven: 7.357 (ref 7.25–7.43)
pO2, Ven: 66 mmHg — ABNORMAL HIGH (ref 32–45)

## 2022-06-29 LAB — CBC WITH DIFFERENTIAL/PLATELET
Abs Immature Granulocytes: 0.6 10*3/uL — ABNORMAL HIGH (ref 0.00–0.07)
Band Neutrophils: 33 %
Basophils Absolute: 0 10*3/uL (ref 0.0–0.1)
Basophils Relative: 0 %
Eosinophils Absolute: 0 10*3/uL (ref 0.0–0.5)
Eosinophils Relative: 0 %
HCT: 23.8 % — ABNORMAL LOW (ref 39.0–52.0)
Hemoglobin: 7.9 g/dL — ABNORMAL LOW (ref 13.0–17.0)
Lymphocytes Relative: 4 %
Lymphs Abs: 0.8 10*3/uL (ref 0.7–4.0)
MCH: 35.9 pg — ABNORMAL HIGH (ref 26.0–34.0)
MCHC: 33.2 g/dL (ref 30.0–36.0)
MCV: 108.2 fL — ABNORMAL HIGH (ref 80.0–100.0)
Metamyelocytes Relative: 2 %
Monocytes Absolute: 0.2 10*3/uL (ref 0.1–1.0)
Monocytes Relative: 1 %
Myelocytes: 1 %
Neutro Abs: 18.6 10*3/uL — ABNORMAL HIGH (ref 1.7–7.7)
Neutrophils Relative %: 59 %
Platelets: 40 10*3/uL — ABNORMAL LOW (ref 150–400)
RBC: 2.2 MIL/uL — ABNORMAL LOW (ref 4.22–5.81)
RDW: 16.9 % — ABNORMAL HIGH (ref 11.5–15.5)
WBC Morphology: INCREASED
WBC: 20.2 10*3/uL — ABNORMAL HIGH (ref 4.0–10.5)
nRBC: 0.2 % (ref 0.0–0.2)

## 2022-06-29 LAB — POCT I-STAT 7, (LYTES, BLD GAS, ICA,H+H)
Acid-base deficit: 9 mmol/L — ABNORMAL HIGH (ref 0.0–2.0)
Bicarbonate: 16.7 mmol/L — ABNORMAL LOW (ref 20.0–28.0)
Calcium, Ion: 0.92 mmol/L — ABNORMAL LOW (ref 1.15–1.40)
HCT: 24 % — ABNORMAL LOW (ref 39.0–52.0)
Hemoglobin: 8.2 g/dL — ABNORMAL LOW (ref 13.0–17.0)
O2 Saturation: 76 %
Patient temperature: 35.5
Potassium: 5.2 mmol/L — ABNORMAL HIGH (ref 3.5–5.1)
Sodium: 125 mmol/L — ABNORMAL LOW (ref 135–145)
TCO2: 18 mmol/L — ABNORMAL LOW (ref 22–32)
pCO2 arterial: 30.9 mmHg — ABNORMAL LOW (ref 32–48)
pH, Arterial: 7.334 — ABNORMAL LOW (ref 7.35–7.45)
pO2, Arterial: 40 mmHg — CL (ref 83–108)

## 2022-06-29 LAB — TROPONIN I (HIGH SENSITIVITY)
Troponin I (High Sensitivity): 3201 ng/L (ref <18)
Troponin I (High Sensitivity): 2597 ng/L (ref <18)
Troponin I (High Sensitivity): 3345 ng/L (ref <18)
Troponin I (High Sensitivity): 3256 ng/L

## 2022-06-29 LAB — URINALYSIS, ROUTINE W REFLEX MICROSCOPIC
Glucose, UA: NEGATIVE mg/dL
Hgb urine dipstick: NEGATIVE
Ketones, ur: NEGATIVE mg/dL
Nitrite: NEGATIVE
Protein, ur: 100 mg/dL — AB
Specific Gravity, Urine: 1.021 (ref 1.005–1.030)
pH: 5 (ref 5.0–8.0)

## 2022-06-29 LAB — LACTIC ACID, PLASMA
Lactic Acid, Venous: 5.7 mmol/L (ref 0.5–1.9)
Lactic Acid, Venous: 7 mmol/L (ref 0.5–1.9)
Lactic Acid, Venous: 6.6 mmol/L (ref 0.5–1.9)
Lactic Acid, Venous: 5.7 mmol/L (ref 0.5–1.9)

## 2022-06-29 LAB — GLUCOSE, CAPILLARY
Glucose-Capillary: 67 mg/dL — ABNORMAL LOW (ref 70–99)
Glucose-Capillary: 96 mg/dL (ref 70–99)
Glucose-Capillary: 78 mg/dL (ref 70–99)
Glucose-Capillary: 75 mg/dL (ref 70–99)

## 2022-06-29 LAB — I-STAT CHEM 8, ED
BUN: 68 mg/dL — ABNORMAL HIGH (ref 6–20)
Calcium, Ion: 0.84 mmol/L — CL (ref 1.15–1.40)
Chloride: 91 mmol/L — ABNORMAL LOW (ref 98–111)
Creatinine, Ser: 6.1 mg/dL — ABNORMAL HIGH (ref 0.61–1.24)
Glucose, Bld: 163 mg/dL — ABNORMAL HIGH (ref 70–99)
HCT: 25 % — ABNORMAL LOW (ref 39.0–52.0)
Hemoglobin: 8.5 g/dL — ABNORMAL LOW (ref 13.0–17.0)
Potassium: 5.8 mmol/L — ABNORMAL HIGH (ref 3.5–5.1)
Sodium: 121 mmol/L — ABNORMAL LOW (ref 135–145)
TCO2: 18 mmol/L — ABNORMAL LOW (ref 22–32)

## 2022-06-29 LAB — BRAIN NATRIURETIC PEPTIDE: B Natriuretic Peptide: 956.1 pg/mL — ABNORMAL HIGH (ref 0.0–100.0)

## 2022-06-29 LAB — ETHANOL: Alcohol, Ethyl (B): <10 mg/dL (ref <10)

## 2022-06-29 LAB — RAPID URINE DRUG SCREEN, HOSP PERFORMED
Amphetamines: NOT DETECTED
Barbiturates: NOT DETECTED
Benzodiazepines: NOT DETECTED
Cocaine: POSITIVE — AB
Opiates: POSITIVE — AB
Tetrahydrocannabinol: NOT DETECTED

## 2022-06-29 LAB — CBG MONITORING, ED
Glucose-Capillary: 178 mg/dL — ABNORMAL HIGH (ref 70–99)
Glucose-Capillary: 422 mg/dL — ABNORMAL HIGH (ref 70–99)
Glucose-Capillary: <10 mg/dL — CL (ref 70–99)
Glucose-Capillary: 82 mg/dL (ref 70–99)
Glucose-Capillary: 83 mg/dL (ref 70–99)

## 2022-06-29 LAB — AMMONIA: Ammonia: 74 umol/L — ABNORMAL HIGH (ref 9–35)

## 2022-06-29 LAB — PROTIME-INR
INR: 3.7 — ABNORMAL HIGH (ref 0.8–1.2)
Prothrombin Time: 36.2 seconds — ABNORMAL HIGH (ref 11.4–15.2)

## 2022-06-29 LAB — CK: Total CK: 391 U/L (ref 49–397)

## 2022-06-29 LAB — HIV ANTIBODY (ROUTINE TESTING W REFLEX): HIV Screen 4th Generation wRfx: NONREACTIVE

## 2022-06-29 MED ORDER — PANTOPRAZOLE SODIUM 40 MG IV SOLR
40.0000 mg | INTRAVENOUS | Status: DC
  Administered 2022-06-29: 40 mg via INTRAVENOUS
  Filled 2022-06-29: qty 10

## 2022-06-29 MED ORDER — LACTULOSE ENEMA
300.0000 mL | Freq: Every day | ORAL | Status: DC
  Administered 2022-06-29: 300 mL via RECTAL
  Filled 2022-06-29 (×2): qty 300

## 2022-06-29 MED ORDER — CALCIUM GLUCONATE-NACL 1-0.675 GM/50ML-% IV SOLN
1.0000 g | Freq: Once | INTRAVENOUS | Status: AC
  Administered 2022-06-29: 1000 mg via INTRAVENOUS
  Filled 2022-06-29: qty 50

## 2022-06-29 MED ORDER — VANCOMYCIN VARIABLE DOSE PER UNSTABLE RENAL FUNCTION (PHARMACIST DOSING): Status: DC

## 2022-06-29 MED ORDER — SODIUM CHLORIDE 0.9 % IV SOLN: 250.0000 mL | INTRAVENOUS | Status: DC

## 2022-06-29 MED ORDER — SODIUM CHLORIDE 0.9 % IV BOLUS
500.0000 mL | Freq: Once | INTRAVENOUS | Status: AC
  Administered 2022-06-29: 500 mL via INTRAVENOUS

## 2022-06-29 MED ORDER — DOCUSATE SODIUM 100 MG PO CAPS: 100.0000 mg | ORAL_CAPSULE | Freq: Two times a day (BID) | ORAL | Status: DC | PRN

## 2022-06-29 MED ORDER — VANCOMYCIN HCL IN DEXTROSE 1-5 GM/200ML-% IV SOLN
1000.0000 mg | Freq: Once | INTRAVENOUS | Status: AC
  Administered 2022-06-29: 1000 mg via INTRAVENOUS

## 2022-06-29 MED ORDER — ALBUMIN HUMAN 5 % IV SOLN
25.0000 g | Freq: Once | INTRAVENOUS | Status: AC
  Administered 2022-06-29: 25 g via INTRAVENOUS
  Filled 2022-06-29: qty 500

## 2022-06-29 MED ORDER — SODIUM ZIRCONIUM CYCLOSILICATE 10 G PO PACK
10.0000 g | PACK | Freq: Once | ORAL | Status: AC
  Administered 2022-06-29: 10 g via ORAL
  Filled 2022-06-29: qty 1

## 2022-06-29 MED ORDER — SODIUM CHLORIDE 0.9 % IV BOLUS
1000.0000 mL | Freq: Once | INTRAVENOUS | Status: AC
  Administered 2022-06-29: 1000 mL via INTRAVENOUS

## 2022-06-29 MED ORDER — VANCOMYCIN HCL IN DEXTROSE 1-5 GM/200ML-% IV SOLN: 1000.0000 mg | INTRAVENOUS | Status: DC

## 2022-06-29 MED ORDER — FENTANYL CITRATE PF 50 MCG/ML IJ SOSY
25.0000 ug | PREFILLED_SYRINGE | Freq: Once | INTRAMUSCULAR | Status: AC
  Administered 2022-06-29: 25 ug via INTRAVENOUS
  Filled 2022-06-29: qty 1

## 2022-06-29 MED ORDER — METRONIDAZOLE 500 MG/100ML IV SOLN
500.0000 mg | Freq: Once | INTRAVENOUS | Status: AC
  Administered 2022-06-29: 500 mg via INTRAVENOUS
  Filled 2022-06-29: qty 100

## 2022-06-29 MED ORDER — SODIUM BICARBONATE 8.4 % IV SOLN
50.0000 meq | Freq: Once | INTRAVENOUS | Status: AC
  Administered 2022-06-29: 50 meq via INTRAVENOUS
  Filled 2022-06-29: qty 50

## 2022-06-29 MED ORDER — FENTANYL CITRATE PF 50 MCG/ML IJ SOSY
50.0000 ug | PREFILLED_SYRINGE | Freq: Once | INTRAMUSCULAR | Status: DC
  Filled 2022-06-29: qty 1

## 2022-06-29 MED ORDER — DEXTROSE 50 % IV SOLN
INTRAVENOUS | Status: AC
  Administered 2022-06-29: 50 mL
  Filled 2022-06-29: qty 50

## 2022-06-29 MED ORDER — NOREPINEPHRINE 4 MG/250ML-% IV SOLN
2.0000 ug/min | INTRAVENOUS | Status: DC
  Administered 2022-06-29: 5 ug/min via INTRAVENOUS
  Administered 2022-06-30 (×3): 10 ug/min via INTRAVENOUS
  Filled 2022-06-29 (×3): qty 250

## 2022-06-29 MED ORDER — ONDANSETRON HCL 4 MG/2ML IJ SOLN
4.0000 mg | Freq: Four times a day (QID) | INTRAMUSCULAR | Status: DC | PRN
  Administered 2022-07-03: 4 mg via INTRAVENOUS
  Filled 2022-06-29: qty 2

## 2022-06-29 MED ORDER — VANCOMYCIN HCL IN DEXTROSE 1-5 GM/200ML-% IV SOLN
1000.0000 mg | Freq: Once | INTRAVENOUS | Status: DC
  Filled 2022-06-29: qty 200

## 2022-06-29 MED ORDER — THIAMINE HCL 100 MG/ML IJ SOLN
100.0000 mg | Freq: Every day | INTRAMUSCULAR | Status: DC
  Administered 2022-06-29: 100 mg via INTRAVENOUS
  Filled 2022-06-29: qty 2

## 2022-06-29 MED ORDER — SODIUM CHLORIDE 0.9 % IV SOLN
2.0000 g | INTRAVENOUS | Status: DC
  Administered 2022-06-29: 2 g via INTRAVENOUS
  Filled 2022-06-29: qty 12.5

## 2022-06-29 MED ORDER — DEXTROSE-NACL 5-0.45 % IV SOLN: INTRAVENOUS | Status: DC

## 2022-06-29 MED ORDER — DEXTROSE-NACL 5-0.9 % IV SOLN: INTRAVENOUS | Status: DC

## 2022-06-29 MED ORDER — SODIUM CHLORIDE 0.9 % IV SOLN
1.0000 mg | Freq: Once | INTRAVENOUS | Status: DC
  Filled 2022-06-29: qty 0.2

## 2022-06-29 NOTE — Progress Notes (Addendum)
Pharmacy Antibiotic Note  Kyle Pitts is a 62 y.o. male admitted on 06/29/2022 with sepsis.  Pharmacy has been consulted for vancomycin and cefepime dosing. SCr 6.1 and above baseline. CrCl ~15 ml/min using weight 101 kg from January 2024.   Plan: Vancomycin 2000 mg IV x1  Variable vancomycin per pharmacy due to renal function Cefepime 2 gram IV q24h Monitor renal function Follow signs and symptoms of infection   Temp (24hrs), Avg:93.2 F (34 C), Min:93.2 F (34 C), Max:93.2 F (34 C)  Recent Labs  Lab 06/29/22 1410  CREATININE 6.10*    CrCl cannot be calculated (Unknown ideal weight.).    Allergies  Allergen Reactions   Latex Itching   Tape Itching   Amoxicillin Itching    Itching around eyes and scalp on amoxicilin   Cephalexin Nausea Only    Upset stomach Ceftriaxone is ok   Penicillins Rash   Antimicrobials this admission: Vancomycin 3/11 >>  Cefepime 3/11 >>   Dose adjustments this admission:  Microbiology results:  Thank you for allowing pharmacy to be a part of this patient's care.  Jeneen Rinks, Pharm.D PGY1 Pharmacy Resident 06/29/2022 4:36 PM

## 2022-06-29 NOTE — Progress Notes (Signed)
Assessed at bedside on arrival to ICU.  More alert, waking up / talking & appropriate.  BP improved on levophed.    Following commands.  Assess ABG.  Strong cough.      Noe Gens, MSN, APRN, NP-C, AGACNP-BC [facility] 06/29/2022, 6:11 PM   Please see [email or web address] for pager details.   From 7A-7P if no response, please call [phone] After hours, please call ELink [phone]

## 2022-06-29 NOTE — Progress Notes (Addendum)
Notified Elink of patients critical troponin 3,345 and lactic acid 6.6. Pt also has poor urine output with 30cc in the last 2 hours and increasing levo requirement. CCM ground team will come assess patient at bedside.    Donalda Ewings RN

## 2022-06-29 NOTE — ED Triage Notes (Signed)
Pt to ED via [facility] from home c/o AMS and hypoglycemia. When EMS on seen pt A&O X 1, CBG 36- 25 G D10 Given . Old wound to right lower leg with bandage.  #22 l.forearm  Last VS: 114/50, P 70 R 20, CBG 41

## 2022-06-29 NOTE — ED Notes (Signed)
Returned from CT to find the pt had a BP of 61/35. Pt much less responsive than prior to CT. CC at bedside and notified.

## 2022-06-29 NOTE — Progress Notes (Addendum)
eLink Physician-Brief Progress Note Patient Name: Kyle Pitts DOB: 1960-06-30 MRN: [id number]   Date of Service  06/29/2022  HPI/Events of Note  alcoholic cirrhosis s/p TIPS in 2023, found to be encephalopathic in shock.  Trop and lactic elevated  eICU Interventions  Repeat labs ordered.   2025 - abd xr dc'd due to more recent CT resulted.  2138 - Critical Trop 3345 (was 2597), Lactic trending down to 6.6, BMET also resulted. Maxed out on peripheral levo. Added on albumin bolus, ongoing crystalloid. Plan for temp picc placement. Will discuss CVC with ground team if PICC team unavailable.  Intervention Category Minor Interventions: Routine modifications to care plan (e.g. PRN medications for pain, fever)  Kyle Pitts 06/29/2022, 7:45 PM

## 2022-06-29 NOTE — ED Notes (Signed)
CC updated on BP and mental status

## 2022-06-29 NOTE — ED Provider Notes (Signed)
[facility] Provider Note   CSN: [id number] Arrival date & time: 06/21/2022  1334     History  Chief Complaint  Patient presents with   Hypoglycemia   Altered Mental Status    Kyle Pitts is a 63 y.o. male.  62 year old male with prior medical history as detailed below presents for evaluation.  Patient arrives from home with EMS transport.  Patient's sister apparently called EMS after she could not get her brother to answer the door.  EMS reports the patient was minimally responsive with their initial evaluation.  Initial blood glucose was 36.  Patient was given D10 during transport.  On arrival to the ED the patient is persistently hypoglycemic.  He is also noted to be hypothermic and mildly hypotensive.  Patient appears to be dehydrated.  Patient becomes more coherent and conversational with correction of glucose.  He denies any specific complaint other than feeling thirsty.  Patient cannot quantify exactly how long he was at home prior to EMS being contacted by his family.  The history is provided by the patient, medical records and the EMS personnel.       Home Medications Prior to Admission medications   Medication Sig Start Date End Date Taking? Authorizing Provider  furosemide (LASIX) 20 MG tablet TAKE 1 TABLET(20 MG) BY MOUTH TWICE DAILY 04/21/22   Daryel November, MD  Iron, Ferrous Sulfate, 325 (65 Fe) MG TABS Take 325 mg by mouth daily. 04/07/22   Daryel November, MD  lactulose (CHRONULAC) 10 GM/15ML solution Take 30 mLs (20 g total) by mouth 3 (three) times daily. 06/18/22   Daryel November, MD  lactulose, encephalopathy, (CHRONULAC) 10 GM/15ML SOLN Take 30 mLs (20 g total) by mouth 2 (two) times daily. Patient not taking: Reported on 04/01/2022 12/04/21   Daryel November, MD  meloxicam (MOBIC) 7.5 MG tablet Take 1 tablet (7.5 mg total) by mouth every 12 (twelve) hours as needed for pain. 04/30/22    Daryel November, MD  midodrine (PROAMATINE) 10 MG tablet Take 1 tablet (10 mg total) by mouth 3 (three) times daily with meals. 12/18/21   Daryel November, MD  oxyCODONE (OXY IR/ROXICODONE) 5 MG immediate release tablet Take 1 tablet (5 mg total) by mouth every 6 (six) hours as needed for moderate pain. Patient not taking: Reported on 03/16/2022 09/07/21   Allred, Darrell K, PA-C  pantoprazole (PROTONIX) 40 MG tablet TAKE 1 TABLET(40 MG) BY MOUTH TWICE DAILY 04/30/22   Daryel November, MD  spironolactone (ALDACTONE) 25 MG tablet Take 2 tablets (50 mg total) by mouth 2 (two) times daily. Patient taking differently: Take 25 mg by mouth 2 (two) times daily. 06/25/21   Daryel November, MD      Allergies    Latex, Tape, Amoxicillin, Cephalexin, and Penicillins    Review of Systems   Review of Systems  Unable to perform ROS: Acuity of condition    Physical Exam Updated Vital Signs BP (!) 109/56   Pulse 91   Temp (!) 94.3 F (34.6 C)   Resp (!) 24   SpO2 (!) 89%  Physical Exam Vitals and nursing note reviewed.  Constitutional:      General: He is not in acute distress.    Appearance: He is well-developed.     Comments: Initially somewhat confused, hypoglycemia treated, confusion improved  Patient complains of feeling weak.    HENT:     Head: Normocephalic and atraumatic.  Mouth/Throat:     Mouth: Mucous membranes are dry.  Eyes:     Pupils: Pupils are equal, round, and reactive to light.     Comments: Scleral icterus present  Cardiovascular:     Rate and Rhythm: Normal rate and regular rhythm.     Heart sounds: Normal heart sounds.  Pulmonary:     Effort: Pulmonary effort is normal. No respiratory distress.     Breath sounds: Normal breath sounds.  Abdominal:     General: There is no distension.     Palpations: Abdomen is soft.     Tenderness: There is no abdominal tenderness.  Musculoskeletal:        General: No deformity. Normal range of motion.      Cervical back: Normal range of motion and neck supple.  Skin:    General: Skin is warm and dry.     Comments: Jaundiced  Chronic wound to right lower extremity see images below  Neurological:     General: No focal deficit present.     Mental Status: He is alert and oriented to person, place, and time.     ED Results / Procedures / Treatments   Labs (all labs ordered are listed, but only abnormal results are displayed) Labs Reviewed  URINALYSIS, ROUTINE W REFLEX MICROSCOPIC - Abnormal; Notable for the following components:      Result Value   Color, Urine AMBER (*)    APPearance CLOUDY (*)    Bilirubin Urine SMALL (*)    Protein, ur 100 (*)    Leukocytes,Ua TRACE (*)    Bacteria, UA FEW (*)    All other components within normal limits  CBC WITH DIFFERENTIAL/PLATELET - Abnormal; Notable for the following components:   WBC 20.2 (*)    RBC 2.20 (*)    Hemoglobin 7.9 (*)    HCT 23.8 (*)    MCV 108.2 (*)    MCH 35.9 (*)    RDW 16.9 (*)    Platelets 40 (*)    Neutro Abs 18.6 (*)    Abs Immature Granulocytes 0.60 (*)    All other components within normal limits  PROTIME-INR - Abnormal; Notable for the following components:   Prothrombin Time 36.2 (*)    INR 3.7 (*)    All other components within normal limits  LACTIC ACID, PLASMA - Abnormal; Notable for the following components:   Lactic Acid, Venous 5.7 (*)    All other components within normal limits  COMPREHENSIVE METABOLIC PANEL - Abnormal; Notable for the following components:   Sodium 122 (*)    Potassium 5.6 (*)    Chloride 89 (*)    CO2 17 (*)    Glucose, Bld 185 (*)    BUN 61 (*)    Creatinine, Ser 5.42 (*)    Calcium 7.1 (*)    Total Protein 5.1 (*)    Albumin 1.9 (*)    AST 93 (*)    Alkaline Phosphatase 217 (*)    Total Bilirubin 7.9 (*)    GFR, Estimated 11 (*)    Anion gap 16 (*)    All other components within normal limits  BRAIN NATRIURETIC PEPTIDE - Abnormal; Notable for the following components:    B Natriuretic Peptide 956.1 (*)    All other components within normal limits  AMMONIA - Abnormal; Notable for the following components:   Ammonia 74 (*)    All other components within normal limits  CBG MONITORING, ED - Abnormal; Notable for  the following components:   Glucose-Capillary <10 (*)    All other components within normal limits  CBG MONITORING, ED - Abnormal; Notable for the following components:   Glucose-Capillary 422 (*)    All other components within normal limits  I-STAT VENOUS BLOOD GAS, ED - Abnormal; Notable for the following components:   pCO2, Ven 31.6 (*)    pO2, Ven 66 (*)    Bicarbonate 17.7 (*)    TCO2 19 (*)    Acid-base deficit 7.0 (*)    Sodium 121 (*)    Potassium 5.8 (*)    Calcium, Ion 0.87 (*)    HCT 25.0 (*)    Hemoglobin 8.5 (*)    All other components within normal limits  I-STAT CHEM 8, ED - Abnormal; Notable for the following components:   Sodium 121 (*)    Potassium 5.8 (*)    Chloride 91 (*)    BUN 68 (*)    Creatinine, Ser 6.10 (*)    Glucose, Bld 163 (*)    Calcium, Ion 0.84 (*)    TCO2 18 (*)    Hemoglobin 8.5 (*)    HCT 25.0 (*)    All other components within normal limits  CBG MONITORING, ED - Abnormal; Notable for the following components:   Glucose-Capillary 178 (*)    All other components within normal limits  TROPONIN I (HIGH SENSITIVITY) - Abnormal; Notable for the following components:   Troponin I (High Sensitivity) 3,201 (*)    All other components within normal limits  RESP PANEL BY RT-PCR (RSV, FLU A&B, COVID)  RVPGX2  CULTURE, BLOOD (ROUTINE X 2)  CULTURE, BLOOD (ROUTINE X 2)  ETHANOL  CK  RAPID URINE DRUG SCREEN, HOSP PERFORMED  LACTIC ACID, PLASMA  CBG MONITORING, ED  TROPONIN I (HIGH SENSITIVITY)    EKG EKG Interpretation  Date/Time:  Monday June 29 2022 15:43:12 EDT Ventricular Rate:  82 PR Interval:  180 QRS Duration: 110 QT Interval:  409 QTC Calculation: 478 R Axis:   66 Text  Interpretation: Sinus rhythm Atrial premature complexes Minimal ST depression, anterolateral leads Borderline prolonged QT interval Confirmed by Dene Gentry [phone]) on 06/30/2022 3:55:40 PM  Radiology DG Chest Port 1 View  Result Date: 07/14/2022 CLINICAL DATA:  Provided history: Weakness. Altered mental status. Hypoglycemia. EXAM: PORTABLE CHEST 1 VIEW COMPARISON:  Prior chest radiographs 09/07/2021 and earlier. FINDINGS: Heart size at the upper limits of normal. Small ill-defined opacity within the medial right lung base, with an appearance favoring atelectasis. No appreciable airspace consolidation on the left. No evidence of pulmonary edema. No evidence of pleural effusion or pneumothorax. Degenerative changes of the spine. IMPRESSION: Small ill-defined opacity within the medial right lung base, with an appearance favoring atelectasis. Otherwise, no evidence of acute cardiopulmonary abnormality. Electronically Signed   By: Kellie Simmering D.O.   On: 07/15/2022 14:26    Procedures Procedures    Medications Ordered in ED Medications  vancomycin (VANCOCIN) IVPB 1000 mg/200 mL premix (has no administration in time range)    Followed by  vancomycin (VANCOCIN) IVPB 1000 mg/200 mL premix (1,000 mg Intravenous New Bag/Given 07/10/2022 1536)  ceFEPIme (MAXIPIME) 2 g in sodium chloride 0.9 % 100 mL IVPB (0 g Intravenous Stopped 07/10/2022 1530)  vancomycin (VANCOCIN) IVPB 1000 mg/200 mL premix (has no administration in time range)  dextrose 5 %-0.45 % sodium chloride infusion ( Intravenous New Bag/Given 07/13/2022 1534)  metroNIDAZOLE (FLAGYL) IVPB 500 mg (has no administration in time range)  fentaNYL (  SUBLIMAZE) injection 50 mcg (has no administration in time range)  dextrose 50 % solution (50 mLs  Given 07/01/2022 1345)  sodium chloride 0.9 % bolus 500 mL (0 mLs Intravenous Stopped 07/19/2022 1459)  sodium chloride 0.9 % bolus 1,000 mL (1,000 mLs Intravenous New Bag/Given 07/10/2022 1454)  sodium bicarbonate  injection 50 mEq (50 mEq Intravenous Given 07/05/2022 1445)  sodium zirconium cyclosilicate (LOKELMA) packet 10 g (10 g Oral Given 06/21/2022 1542)  sodium chloride 0.9 % bolus 1,000 mL (1,000 mLs Intravenous New Bag/Given 07/02/2022 1456)  fentaNYL (SUBLIMAZE) injection 25 mcg (25 mcg Intravenous Given 07/02/2022 1530)    ED Course/ Medical Decision Making/ A&P                             Medical Decision Making Amount and/or Complexity of Data Reviewed Labs: ordered. Radiology: ordered.  Risk Prescription drug management. Decision regarding hospitalization.    Medical Screen Complete  This patient presented to the ED with complaint of hypoglycemia, AMS.  This complaint involves an extensive number of treatment options. The initial differential diagnosis includes, but is not limited to, metabolic abnormality, infection, etc.  This presentation is: Acute, Chronic, Self-Limited, Previously Undiagnosed, Uncertain Prognosis, Complicated, Systemic Symptoms, and Threat to Life/Bodily Function  Patient presents with hypoglycemia, hypothermia, hypotension.  Patient given glucose, IV fluids, broad-spectrum antibiotics on arrival.  Patient mentation does improve with correction of hypoglycemia.  Significant metabolic derangements found on initial workup.  Notable abnormalities include sodium of 122, potassium of 5.6, creatinine of 5.  4 2, BUN of 61, ammonia of 74, lactic acid of 5.7.  Additionally patient's white count is 20.2, hemoglobin is 7.9, platelet count is 40.  Troponin is elevated at 3201.  Given significant metabolic derangements critical care consulted for likely admission.  Cardiology made aware of elevated troponin.  Patient without evidence of acute ischemia on EKG.  Patient without complaint of chest pain.  Cardiology will consult if requested by critical care.  Additional history obtained:  Additional history obtained from EMS External records from outside sources obtained  and reviewed including prior ED visits and prior Inpatient records.    Lab Tests:  I ordered and personally interpreted labs.  The pertinent results include: CBC, CMP, i-STAT VBG, BNP, troponin, lactic acid, CK, ethanol, ammonia   Imaging Studies ordered:  I ordered imaging studies including CT head, CT abdomen pelvis, plain films of chest, plain films of pelvis  I agree with the radiologist interpretation.   Cardiac Monitoring:  The patient was maintained on a cardiac monitor.  I personally viewed and interpreted the cardiac monitor which showed an underlying rhythm of: Sinus tach   Medicines ordered:  I ordered medication including IV fluids, Lokelma, fentanyl, sodium bicarb, broad-spectrum antibiotics, dextrose for dehydration, AKI, hyperkalemia, suspected infection, pain Reevaluation of the patient after these medicines showed that the patient: improved  Problem List / ED Course:  AMS, hypothermia, hypoglycemia, hypotension, hyponatremia, AKI, lactic acidosis, elevated troponin, leukocytosis, anemia, thrombocytopenia   Reevaluation:  After the interventions noted above, I reevaluated the patient and found that they have: stayed the same   Disposition:  After consideration of the diagnostic results and the patients response to treatment, I feel that the patent would benefit from admission.   CRITICAL CARE Performed by: Valarie Merino   Total critical care time: 30 minutes  Critical care time was exclusive of separately billable procedures and treating other patients.  Critical care was  necessary to treat or prevent imminent or life-threatening deterioration.  Critical care was time spent personally by me on the following activities: development of treatment plan with patient and/or surrogate as well as nursing, discussions with consultants, evaluation of patient's response to treatment, examination of patient, obtaining history from patient or surrogate, ordering  and performing treatments and interventions, ordering and review of laboratory studies, ordering and review of radiographic studies, pulse oximetry and re-evaluation of patient's condition.          Final Clinical Impression(s) / ED Diagnoses Final diagnoses:  Hypoglycemia  Hypothermia, initial encounter  AKI (acute kidney injury) ([place])  Hyponatremia    Rx / DC Orders ED Discharge Orders     None         Valarie Merino, MD 06/30/2022 [phone]

## 2022-06-29 NOTE — ED Notes (Signed)
Regina Beidleman-[phone] called to ask for an update on the pt

## 2022-06-29 NOTE — ED Notes (Signed)
Bair hugger placed.

## 2022-06-29 NOTE — ED Notes (Signed)
Dr.Messick shown results of Istat Chem8 and VBG. ED-Lab

## 2022-06-29 NOTE — H&P (Addendum)
NAME:  Kyle Pitts, MRN:  [id number], DOB:  04-17-1961, LOS: 0 ADMISSION DATE:  07/16/2022, CONSULTATION DATE:  3/11 REFERRING MD:  Dr. Francia Greaves, CHIEF COMPLAINT:  AMS   History of Present Illness:  62 y/o M who presented to [facility] ER on 3/11 after being found down at home.    Per report, his sister found him down at home. The patient reportedly cares for his sister. Unknown total down time.  EMS called and found his glucose to be 20-30's. Initial ER evaluation notable for multiple metabolic derangements (Na 122, K 5.6, CO2 17, BUn 61/Cr 5.42, iCA 0.84, lactic acid 5.9, WBC 20, Hgb 7.9, platelets 40, and troponin 3,200), hypothermia but stable hemodynamics. COVID/Flu A&B / RSV negative. UDS positive for cocaine & opiates.  He was altered but somewhat interactive on arrival.  He was sent for CT head which was negative for acute process. Additionally, he had a CT ABD that showed scattered airspace opacities in the lung bases R>L, interval TIPS with resolution of the abdominal ascites. CXR showed a small ill-defined opacity in the medial right lung base, suspected to be atelectasis. He was treated with IVF, empiric antibiotics, and pan cultured.  After returning from CT he was hypotensive with SBP in the 60's.  He was given an additional 500 ml NS bolus, additional 50 mEq of sodium bicarbonate and calcium gluconate.   PCCM called for ICU admission.   Pertinent  Medical History  Anemia  ETOH Cirrhosis s/p TIPS Peptic Ulcers - gastric & duodenal  Esophageal Varices  Hyponatremia  Allergic Reaction  Septic Arthritis  Cataracts Group A Strep Infection 2022  Significant Hospital Events: Including procedures, antibiotic start and stop dates in addition to other pertinent events   3/11 Admit after being found down  Interim History / Subjective:  Hypothermic on bair hugger  No family at bedside  As above   Objective   Blood pressure (!) 109/56, pulse 91, temperature (!) 94.3 F (34.6 C), resp.  rate (!) 24, SpO2 (!) 89 %.        Intake/Output Summary (Last 24 hours) at 07/19/2022 1603 Last data filed at 07/04/2022 1459 Gross per 24 hour  Intake 500 ml  Output --  Net 500 ml   There were no vitals filed for this visit.  Examination: General: chronically ill appearing adult male lying on ER stretcher  HENT: MM bloody/dry, missing teeth, mild icterus, pupils =/reactive  Lungs: Non-labored at rest, lungs bilaterally clear with good air entry  Cardiovascular: s1s2 RRR, no m/r/g, SR on monitor Abdomen: obese/soft, no significant ascites, bsx4 active  Extremities: cool/dry Neuro: altered, answers to name / drifts back to sleep    Resolved Hospital Problem list     Assessment & Plan:   Suspected Septic Shock, r/o PNA  R/O PNA  Hypothermia, elevated Lactic Acid, Leukocytosis No clear abdominal source: no ascites on CT, or other acute issues on imaging. UA negative. EKG without ST changes. COVID/RSV/Flu negative. Baseline on midodrine.  -admit to ICU -assess UA, blood cultures > not likely to get sputum currently  -follow lactic acid  -empiric cefepime + vancomycin. S/p flagyl x1 -give additional amp sodium bicarbonate, calcium gluconate now  -rewarming measures  -levophed for MAP >65  -follow intermittent CXR  -O2 to support sats >90%, at risk intubation   Elevated Troponin  Cocaine Abuse Suspect in setting of demand from cocaine use  -cycle troponin  -no changes on EKG  AKI  Hyperkalemia Hyponatremia  -Trend  BMP / urinary output -Replace electrolytes as indicated - calcium gluconate -no hydro on CT -D5NS at 158m/hr  -Avoid nephrotoxic agents, ensure adequate renal perfusion  Acute Metabolic Encephalopathy  Hyperammonemia  -minimize sedating medications  -as below  -follow neuro exam   ETOH Cirrhosis  ESLD s/p TIPS Coagulopathy  Followed by Dr. CCandis Schatzat [place]  Hx of frequent para's, up to 7L per week, prior to TIPS. MELD-Na Score 40.   -PPI  Q24  -no evidence of acute bleeding  -continue lactulose when able to take PO -hold home agents for cirrhosis  -PR lactulose   Macrocytic Anemia  -trend CBC  -transfuse  -thiamine/folate IV   Hypoglycemia  -CBG Q4 x48 hours   Best Practice (right click and "Reselect all SmartList Selections" daily)  Diet/type: NPO DVT prophylaxis: SCD GI prophylaxis: PPI Lines: N/A Foley:  Yes, and it is still needed Code Status:  full code Last date of multidisciplinary goals of care discussion: pending, default full code.   Labs   CBC: Recent Labs  Lab 07/07/2022 1353 06/19/2022 1409 07/07/2022 1410  WBC 20.2*  --   --   NEUTROABS 18.6*  --   --   HGB 7.9* 8.5* 8.5*  HCT 23.8* 25.0* 25.0*  MCV 108.2*  --   --   PLT 40*  --   --     Basic Metabolic Panel: Recent Labs  Lab 06/28/2022 1353 06/26/2022 1409 07/17/2022 1410  NA 122* 121* 121*  K 5.6* 5.8* 5.8*  CL 89*  --  91*  CO2 17*  --   --   GLUCOSE 185*  --  163*  BUN 61*  --  68*  CREATININE 5.42*  --  6.10*  CALCIUM 7.1*  --   --    GFR: CrCl cannot be calculated (Unknown ideal weight.). Recent Labs  Lab 07/02/2022 1353  WBC 20.2*  LATICACIDVEN 5.7*    Liver Function Tests: Recent Labs  Lab 07/08/2022 1353  AST 93*  ALT 23  ALKPHOS 217*  BILITOT 7.9*  PROT 5.1*  ALBUMIN 1.9*   No results for input(s): "LIPASE", "AMYLASE" in the last 168 hours. Recent Labs  Lab 06/25/2022 1353  AMMONIA 74*    ABG    Component Value Date/Time   PHART 7.263 (L) 09/05/2021 1457   PCO2ART 45.0 09/05/2021 1457   PO2ART 144 (H) 09/05/2021 1457   HCO3 17.7 (L) 07/09/2022 1409   TCO2 18 (L) 06/28/2022 1410   ACIDBASEDEF 7.0 (H) 07/01/2022 1409   O2SAT 92 06/30/2022 1409     Coagulation Profile: Recent Labs  Lab 07/15/2022 1353  INR 3.7*    Cardiac Enzymes: Recent Labs  Lab 07/13/2022 1353  CKTOTAL 391    HbA1C: No results found for: "HGBA1C"  CBG: Recent Labs  Lab 07/19/2022 1341 07/13/2022 1351 07/19/2022 1355  06/21/2022 1447  GLUCAP <10* 422* 178* 82    Review of Systems: Positives in BNo Name  Unable to complete with patient due to altered mental status.   Past Medical History:  He,  has a past medical history of Allergic reaction (11/21/2020), Anemia, Asthma, Blood transfusion without reported diagnosis, Cataract, Cirrhosis ([place], Hyponatremia, Myofasciitis (11/21/2020), Osteomyelitis of right leg ([place] (11/21/2020), Septic arthritis ([place], and Streptococcus infection, group A (11/21/2020).   Surgical History:   Past Surgical History:  Procedure Laterality Date   BIOPSY  02/15/2021   Procedure: BIOPSY;  Surgeon: CDaryel November MD;  [facility];  Service: Gastroenterology;;   ESOPHAGOGASTRODUODENOSCOPY N/A 02/15/2021  Procedure: ESOPHAGOGASTRODUODENOSCOPY (EGD);  Surgeon: Daryel November, MD;  Location: [place];  Service: Gastroenterology;  Laterality: N/A;   ESOPHAGOGASTRODUODENOSCOPY (EGD) WITH PROPOFOL N/A 03/19/2021   Procedure: ESOPHAGOGASTRODUODENOSCOPY (EGD) WITH PROPOFOL;  Surgeon: Sharyn Creamer, MD;  Location: [place];  Service: Gastroenterology;  Laterality: N/A;   HEMOSTASIS CLIP PLACEMENT  03/19/2021   Procedure: HEMOSTASIS CLIP PLACEMENT;  Surgeon: Sharyn Creamer, MD;  Location[facility];  Service: Gastroenterology;;   I & D EXTREMITY Right 10/09/2020   Procedure: IRRIGATION AND DEBRIDEMENT EXTREMITY;  Surgeon: Nicholes Stairs, MD;  Location[facility];  Service: Orthopedics;  Laterality: Right;   IR IVUS EACH ADDITIONAL NON CORONARY VESSEL  09/05/2021   IR PARACENTESIS  03/18/2021   IR PARACENTESIS  04/09/2021   IR PARACENTESIS  04/22/2021   IR PARACENTESIS  05/02/2021   IR PARACENTESIS  05/16/2021   IR PARACENTESIS  05/29/2021   IR PARACENTESIS  06/13/2021   IR PARACENTESIS  06/25/2021   IR PARACENTESIS  07/04/2021   IR PARACENTESIS  07/16/2021   IR PARACENTESIS  07/25/2021   IR PARACENTESIS  07/31/2021   IR PARACENTESIS  08/06/2021   IR  PARACENTESIS  08/14/2021   IR PARACENTESIS  08/21/2021   IR PARACENTESIS  08/29/2021   IR PARACENTESIS  09/05/2021   IR PARACENTESIS  09/24/2021   IR PARACENTESIS  10/07/2021   IR PARACENTESIS  10/23/2021   IR RADIOLOGIST EVAL & MGMT  08/11/2021   IR RADIOLOGIST EVAL & MGMT  10/14/2021   IR RADIOLOGIST EVAL & MGMT  12/15/2021   IR RADIOLOGIST EVAL & MGMT  03/16/2022   IR TIPS  09/05/2021   IR US GUIDE VASC ACCESS RIGHT  09/05/2021   KNEE ARTHROSCOPY Right 09/27/2020   Procedure: ARTHROSCOPY KNEE, [place] OUT;  Surgeon: Melina Schools, MD;  Location: [facility];  Service: Orthopedics;  Laterality: Right;   KNEE ARTHROSCOPY Right 10/09/2020   Procedure: ARTHROSCOPY KNEE,ARTHROSCOPIC I & D, CALF ABSCESS;  Surgeon: Nicholes Stairs, MD;  Location: [facility];  Service: Orthopedics;  Laterality: Right;   RADIOLOGY WITH ANESTHESIA N/A 09/05/2021   Procedure: TIPS;  Surgeon: Suzette Battiest, MD;  Location: [place];  Service: Radiology;  Laterality: N/A;     Social History:   reports that he has never smoked. He has never been exposed to tobacco smoke. He has never used smokeless tobacco. He reports that he does not currently use alcohol. He reports that he does not currently use drugs.   Family History:  His family history includes Cirrhosis in his mother; Colon cancer in his father; Heart failure in his father; Hepatitis in his mother; Kidney failure in his brother; Liver cancer in his brother; Stroke in his father. There is no history of Stomach cancer, Esophageal cancer, Colon polyps, Crohn's disease, Rectal cancer, or Ulcerative colitis.   Allergies Allergies  Allergen Reactions   Latex Itching   Tape Itching   Amoxicillin Itching    Itching around eyes and scalp on amoxicilin   Cephalexin Nausea Only    Upset stomach Ceftriaxone is ok   Penicillins Rash     Home Medications  Prior to Admission medications   Medication Sig Start Date End Date Taking? Authorizing Provider  furosemide (LASIX) 20 MG  tablet TAKE 1 TABLET(20 MG) BY MOUTH TWICE DAILY 04/21/22   Daryel November, MD  Iron, Ferrous Sulfate, 325 (65 Fe) MG TABS Take 325 mg by mouth daily. 04/07/22   Daryel November, MD  lactulose ([place]) 10 GM/15ML  solution Take 30 mLs (20 g total) by mouth 3 (three) times daily. 06/18/22   Daryel November, MD  lactulose, encephalopathy, (CHRONULAC) 10 GM/15ML SOLN Take 30 mLs (20 g total) by mouth 2 (two) times daily. Patient not taking: Reported on 04/01/2022 12/04/21   Daryel November, MD  meloxicam (MOBIC) 7.5 MG tablet Take 1 tablet (7.5 mg total) by mouth every 12 (twelve) hours as needed for pain. 04/30/22   Daryel November, MD  midodrine (PROAMATINE) 10 MG tablet Take 1 tablet (10 mg total) by mouth 3 (three) times daily with meals. 12/18/21   Daryel November, MD  oxyCODONE (OXY IR/ROXICODONE) 5 MG immediate release tablet Take 1 tablet (5 mg total) by mouth every 6 (six) hours as needed for moderate pain. Patient not taking: Reported on 03/16/2022 09/07/21   Allred, Darrell K, PA-C  pantoprazole (PROTONIX) 40 MG tablet TAKE 1 TABLET(40 MG) BY MOUTH TWICE DAILY 04/30/22   Daryel November, MD  spironolactone (ALDACTONE) 25 MG tablet Take 2 tablets (50 mg total) by mouth 2 (two) times daily. Patient taking differently: Take 25 mg by mouth 2 (two) times daily. 06/25/21   Daryel November, MD     Critical care time: 22 minutes     Noe Gens, MSN, APRN, NP-C, AGACNP-BC [facility] 06/22/2022, 4:03 PM   Please see [email or web address] for pager details.   From 7A-7P if no response, please call [phone] After hours, please call ELink [phone]

## 2022-06-30 ENCOUNTER — Other Ambulatory Visit (HOSPITAL_COMMUNITY): Payer: Medicaid Other

## 2022-06-30 ENCOUNTER — Inpatient Hospital Stay (HOSPITAL_COMMUNITY): Payer: Medicaid Other

## 2022-06-30 DIAGNOSIS — M00061 Staphylococcal arthritis, right knee: Secondary | ICD-10-CM | POA: Diagnosis not present

## 2022-06-30 DIAGNOSIS — B9562 Methicillin resistant Staphylococcus aureus infection as the cause of diseases classified elsewhere: Secondary | ICD-10-CM | POA: Diagnosis not present

## 2022-06-30 DIAGNOSIS — N179 Acute kidney failure, unspecified: Secondary | ICD-10-CM | POA: Diagnosis not present

## 2022-06-30 DIAGNOSIS — R7881 Bacteremia: Secondary | ICD-10-CM

## 2022-06-30 DIAGNOSIS — A419 Sepsis, unspecified organism: Secondary | ICD-10-CM | POA: Diagnosis not present

## 2022-06-30 DIAGNOSIS — R6521 Severe sepsis with septic shock: Secondary | ICD-10-CM

## 2022-06-30 DIAGNOSIS — G9341 Metabolic encephalopathy: Secondary | ICD-10-CM | POA: Diagnosis not present

## 2022-06-30 DIAGNOSIS — M009 Pyogenic arthritis, unspecified: Secondary | ICD-10-CM | POA: Diagnosis present

## 2022-06-30 DIAGNOSIS — J9601 Acute respiratory failure with hypoxia: Secondary | ICD-10-CM

## 2022-06-30 DIAGNOSIS — A4102 Sepsis due to Methicillin resistant Staphylococcus aureus: Principal | ICD-10-CM

## 2022-06-30 LAB — POCT I-STAT 7, (LYTES, BLD GAS, ICA,H+H)
Acid-base deficit: 5 mmol/L — ABNORMAL HIGH (ref 0.0–2.0)
Acid-base deficit: 4 mmol/L — ABNORMAL HIGH (ref 0.0–2.0)
Bicarbonate: 21.9 mmol/L (ref 20.0–28.0)
Bicarbonate: 22 mmol/L (ref 20.0–28.0)
Calcium, Ion: 0.94 mmol/L — ABNORMAL LOW (ref 1.15–1.40)
Calcium, Ion: 0.83 mmol/L — CL (ref 1.15–1.40)
HCT: 27 % — ABNORMAL LOW (ref 39.0–52.0)
HCT: 25 % — ABNORMAL LOW (ref 39.0–52.0)
Hemoglobin: 9.2 g/dL — ABNORMAL LOW (ref 13.0–17.0)
Hemoglobin: 8.5 g/dL — ABNORMAL LOW (ref 13.0–17.0)
O2 Saturation: 96 %
O2 Saturation: 89 %
Patient temperature: 36.9
Patient temperature: 36.4
Potassium: 5.5 mmol/L — ABNORMAL HIGH (ref 3.5–5.1)
Potassium: 5.7 mmol/L — ABNORMAL HIGH (ref 3.5–5.1)
Sodium: 127 mmol/L — ABNORMAL LOW (ref 135–145)
Sodium: 127 mmol/L — ABNORMAL LOW (ref 135–145)
TCO2: 23 mmol/L (ref 22–32)
TCO2: 23 mmol/L (ref 22–32)
pCO2 arterial: 46.1 mmHg (ref 32–48)
pCO2 arterial: 43.2 mmHg (ref 32–48)
pH, Arterial: 7.284 — ABNORMAL LOW (ref 7.35–7.45)
pH, Arterial: 7.311 — ABNORMAL LOW (ref 7.35–7.45)
pO2, Arterial: 94 mmHg (ref 83–108)
pO2, Arterial: 61 mmHg — ABNORMAL LOW (ref 83–108)

## 2022-06-30 LAB — DIC (DISSEMINATED INTRAVASCULAR COAGULATION)PANEL
D-Dimer, Quant: >20 ug/mL-FEU — ABNORMAL HIGH (ref 0.00–0.50)
Fibrinogen: 158 mg/dL — ABNORMAL LOW (ref 210–475)
INR: 3 — ABNORMAL HIGH (ref 0.8–1.2)
Platelets: 59 10*3/uL — ABNORMAL LOW (ref 150–400)
Prothrombin Time: 31.1 seconds — ABNORMAL HIGH (ref 11.4–15.2)
Smear Review: NONE SEEN
aPTT: 69 seconds — ABNORMAL HIGH (ref 24–36)

## 2022-06-30 LAB — HEPATIC FUNCTION PANEL
ALT: 36 U/L (ref 0–44)
AST: 136 U/L — ABNORMAL HIGH (ref 15–41)
Albumin: 2.1 g/dL — ABNORMAL LOW (ref 3.5–5.0)
Alkaline Phosphatase: 274 U/L — ABNORMAL HIGH (ref 38–126)
Bilirubin, Direct: 5.1 mg/dL — ABNORMAL HIGH (ref 0.0–0.2)
Indirect Bilirubin: 4.9 mg/dL — ABNORMAL HIGH (ref 0.3–0.9)
Total Bilirubin: 10 mg/dL — ABNORMAL HIGH (ref 0.3–1.2)
Total Protein: 5.3 g/dL — ABNORMAL LOW (ref 6.5–8.1)

## 2022-06-30 LAB — BLOOD CULTURE ID PANEL (REFLEXED) - BCID2

## 2022-06-30 LAB — MAGNESIUM
Magnesium: 1.8 mg/dL (ref 1.7–2.4)
Magnesium: 2.1 mg/dL (ref 1.7–2.4)
Magnesium: 2.1 mg/dL (ref 1.7–2.4)
Magnesium: 2.2 mg/dL (ref 1.7–2.4)

## 2022-06-30 LAB — ECHOCARDIOGRAM COMPLETE
AR max vel: 3.22 cm2
AV Area VTI: 4.06 cm2
AV Area mean vel: 3.7 cm2
AV Mean grad: 17 mmHg
AV Peak grad: 31.8 mmHg
Ao pk vel: 2.82 m/s
Area-P 1/2: 4.09 cm2
Height: 72 in
S' Lateral: 3.6 cm
Weight: 3739 oz

## 2022-06-30 LAB — COMPREHENSIVE METABOLIC PANEL
ALT: 39 U/L (ref 0–44)
AST: 145 U/L — ABNORMAL HIGH (ref 15–41)
Albumin: 2.1 g/dL — ABNORMAL LOW (ref 3.5–5.0)
Alkaline Phosphatase: 305 U/L — ABNORMAL HIGH (ref 38–126)
Anion gap: 12 (ref 5–15)
BUN: 66 mg/dL — ABNORMAL HIGH (ref 6–20)
CO2: 20 mmol/L — ABNORMAL LOW (ref 22–32)
Calcium: 6.8 mg/dL — ABNORMAL LOW (ref 8.9–10.3)
Chloride: 95 mmol/L — ABNORMAL LOW (ref 98–111)
Creatinine, Ser: 5.91 mg/dL — ABNORMAL HIGH (ref 0.61–1.24)
GFR, Estimated: 10 mL/min — ABNORMAL LOW (ref >60)
Glucose, Bld: 60 mg/dL — ABNORMAL LOW (ref 70–99)
Potassium: 5.6 mmol/L — ABNORMAL HIGH (ref 3.5–5.1)
Sodium: 127 mmol/L — ABNORMAL LOW (ref 135–145)
Total Bilirubin: 9.9 mg/dL — ABNORMAL HIGH (ref 0.3–1.2)
Total Protein: 5.3 g/dL — ABNORMAL LOW (ref 6.5–8.1)

## 2022-06-30 LAB — COOXEMETRY PANEL
Carboxyhemoglobin: 2 % — ABNORMAL HIGH (ref 0.5–1.5)
Methemoglobin: 0.9 % (ref 0.0–1.5)
O2 Saturation: 94.4 %
Total hemoglobin: 9.5 g/dL — ABNORMAL LOW (ref 12.0–16.0)

## 2022-06-30 LAB — CBC
HCT: 21.7 % — ABNORMAL LOW (ref 39.0–52.0)
Hemoglobin: 7.6 g/dL — ABNORMAL LOW (ref 13.0–17.0)
MCH: 36.2 pg — ABNORMAL HIGH (ref 26.0–34.0)
MCHC: 35 g/dL (ref 30.0–36.0)
MCV: 103.3 fL — ABNORMAL HIGH (ref 80.0–100.0)
Platelets: 32 10*3/uL — ABNORMAL LOW (ref 150–400)
RBC: 2.1 MIL/uL — ABNORMAL LOW (ref 4.22–5.81)
RDW: 16.8 % — ABNORMAL HIGH (ref 11.5–15.5)
WBC: 36.8 10*3/uL — ABNORMAL HIGH (ref 4.0–10.5)
nRBC: 0.5 % — ABNORMAL HIGH (ref 0.0–0.2)

## 2022-06-30 LAB — BASIC METABOLIC PANEL
Anion gap: 11 (ref 5–15)
Anion gap: 16 — ABNORMAL HIGH (ref 5–15)
Anion gap: 11 (ref 5–15)
BUN: 65 mg/dL — ABNORMAL HIGH (ref 6–20)
BUN: 66 mg/dL — ABNORMAL HIGH (ref 6–20)
BUN: 56 mg/dL — ABNORMAL HIGH (ref 6–20)
CO2: 17 mmol/L — ABNORMAL LOW (ref 22–32)
CO2: 15 mmol/L — ABNORMAL LOW (ref 22–32)
CO2: 23 mmol/L (ref 22–32)
Calcium: 6.8 mg/dL — ABNORMAL LOW (ref 8.9–10.3)
Calcium: 6.9 mg/dL — ABNORMAL LOW (ref 8.9–10.3)
Calcium: 7 mg/dL — ABNORMAL LOW (ref 8.9–10.3)
Chloride: 97 mmol/L — ABNORMAL LOW (ref 98–111)
Chloride: 96 mmol/L — ABNORMAL LOW (ref 98–111)
Chloride: 92 mmol/L — ABNORMAL LOW (ref 98–111)
Creatinine, Ser: 5.68 mg/dL — ABNORMAL HIGH (ref 0.61–1.24)
Creatinine, Ser: 5.88 mg/dL — ABNORMAL HIGH (ref 0.61–1.24)
Creatinine, Ser: 4.58 mg/dL — ABNORMAL HIGH (ref 0.61–1.24)
GFR, Estimated: 11 mL/min — ABNORMAL LOW (ref >60)
GFR, Estimated: 10 mL/min — ABNORMAL LOW (ref >60)
GFR, Estimated: 14 mL/min — ABNORMAL LOW (ref >60)
Glucose, Bld: 66 mg/dL — ABNORMAL LOW (ref 70–99)
Glucose, Bld: 63 mg/dL — ABNORMAL LOW (ref 70–99)
Glucose, Bld: 130 mg/dL — ABNORMAL HIGH (ref 70–99)
Potassium: 6.2 mmol/L — ABNORMAL HIGH (ref 3.5–5.1)
Potassium: 6.2 mmol/L — ABNORMAL HIGH (ref 3.5–5.1)
Potassium: 5.5 mmol/L — ABNORMAL HIGH (ref 3.5–5.1)
Sodium: 125 mmol/L — ABNORMAL LOW (ref 135–145)
Sodium: 127 mmol/L — ABNORMAL LOW (ref 135–145)
Sodium: 126 mmol/L — ABNORMAL LOW (ref 135–145)

## 2022-06-30 LAB — VANCOMYCIN, RANDOM: Vancomycin Rm: 9 ug/mL

## 2022-06-30 LAB — PHOSPHORUS
Phosphorus: 5.1 mg/dL — ABNORMAL HIGH (ref 2.5–4.6)
Phosphorus: 6 mg/dL — ABNORMAL HIGH (ref 2.5–4.6)
Phosphorus: 7 mg/dL — ABNORMAL HIGH (ref 2.5–4.6)

## 2022-06-30 LAB — AMMONIA: Ammonia: 65 umol/L — ABNORMAL HIGH (ref 9–35)

## 2022-06-30 LAB — GLUCOSE, CAPILLARY
Glucose-Capillary: 72 mg/dL (ref 70–99)
Glucose-Capillary: 61 mg/dL — ABNORMAL LOW (ref 70–99)
Glucose-Capillary: 109 mg/dL — ABNORMAL HIGH (ref 70–99)
Glucose-Capillary: 97 mg/dL (ref 70–99)
Glucose-Capillary: 109 mg/dL — ABNORMAL HIGH (ref 70–99)
Glucose-Capillary: 138 mg/dL — ABNORMAL HIGH (ref 70–99)

## 2022-06-30 LAB — HEMOGLOBIN AND HEMATOCRIT, BLOOD
HCT: 23.2 % — ABNORMAL LOW (ref 39.0–52.0)
Hemoglobin: 7.8 g/dL — ABNORMAL LOW (ref 13.0–17.0)

## 2022-06-30 LAB — LACTIC ACID, PLASMA: Lactic Acid, Venous: 3.5 mmol/L (ref 0.5–1.9)

## 2022-06-30 LAB — CORTISOL: Cortisol, Plasma: 38.4 ug/dL

## 2022-06-30 MED ORDER — SODIUM BICARBONATE 8.4 % IV SOLN
INTRAVENOUS | Status: DC
  Filled 2022-06-30: qty 1000

## 2022-06-30 MED ORDER — PHENYLEPHRINE HCL-NACL 20-0.9 MG/250ML-% IV SOLN
0.0000 ug/min | INTRAVENOUS | Status: DC
  Administered 2022-06-30: 100 ug/min via INTRAVENOUS
  Administered 2022-07-01: 40 ug/min via INTRAVENOUS
  Filled 2022-06-30 (×2): qty 250

## 2022-06-30 MED ORDER — PRISMASOL BGK 0/2.5 32-2.5 MEQ/L EC SOLN
Status: DC
  Filled 2022-06-30 (×4): qty 5000

## 2022-06-30 MED ORDER — CHLORHEXIDINE GLUCONATE CLOTH 2 % EX PADS
6.0000 | MEDICATED_PAD | Freq: Every day | CUTANEOUS | Status: DC
  Administered 2022-07-01 – 2022-07-03 (×5): 6 via TOPICAL

## 2022-06-30 MED ORDER — SODIUM BICARBONATE 8.4 % IV SOLN
INTRAVENOUS | Status: AC
  Filled 2022-06-30: qty 50

## 2022-06-30 MED ORDER — SUCCINYLCHOLINE CHLORIDE 200 MG/10ML IV SOSY
PREFILLED_SYRINGE | INTRAVENOUS | Status: AC
  Filled 2022-06-30: qty 10

## 2022-06-30 MED ORDER — LINEZOLID 600 MG/300ML IV SOLN
600.0000 mg | Freq: Two times a day (BID) | INTRAVENOUS | Status: DC
  Filled 2022-06-30: qty 300

## 2022-06-30 MED ORDER — MIDAZOLAM HCL 2 MG/2ML IJ SOLN
INTRAMUSCULAR | Status: AC
  Administered 2022-06-30: 2 mg via INTRAVENOUS
  Filled 2022-06-30: qty 2

## 2022-06-30 MED ORDER — VITAL 1.5 CAL PO LIQD
1000.0000 mL | ORAL | Status: DC
  Administered 2022-06-30 – 2022-07-02 (×3): 1000 mL

## 2022-06-30 MED ORDER — ORAL CARE MOUTH RINSE: 15.0000 mL | OROMUCOSAL | Status: DC | PRN

## 2022-06-30 MED ORDER — ALBUMIN HUMAN 25 % IV SOLN
50.0000 g | Freq: Once | INTRAVENOUS | Status: AC
  Administered 2022-06-30: 50 g via INTRAVENOUS
  Filled 2022-06-30: qty 200

## 2022-06-30 MED ORDER — ORAL CARE MOUTH RINSE
15.0000 mL | OROMUCOSAL | Status: DC
  Administered 2022-06-30 – 2022-07-03 (×32): 15 mL via OROMUCOSAL

## 2022-06-30 MED ORDER — PROSOURCE TF20 ENFIT COMPATIBL EN LIQD
60.0000 mL | Freq: Three times a day (TID) | ENTERAL | Status: DC
  Administered 2022-06-30 – 2022-07-03 (×9): 60 mL
  Filled 2022-06-30 (×9): qty 60

## 2022-06-30 MED ORDER — ETOMIDATE 2 MG/ML IV SOLN
INTRAVENOUS | Status: AC
  Administered 2022-06-30: 20 mg
  Filled 2022-06-30: qty 20

## 2022-06-30 MED ORDER — NOREPINEPHRINE 4 MG/250ML-% IV SOLN
0.0000 ug/min | INTRAVENOUS | Status: DC
  Administered 2022-06-30: 30 ug/min via INTRAVENOUS
  Administered 2022-06-30: 20 ug/min via INTRAVENOUS
  Administered 2022-06-30: 19 ug/min via INTRAVENOUS
  Administered 2022-06-30: 26 ug/min via INTRAVENOUS
  Administered 2022-06-30: 20 ug/min via INTRAVENOUS
  Administered 2022-07-01: 15 ug/min via INTRAVENOUS
  Administered 2022-07-01: 20 ug/min via INTRAVENOUS
  Filled 2022-06-30 (×3): qty 250
  Filled 2022-06-30: qty 750

## 2022-06-30 MED ORDER — FENTANYL BOLUS VIA INFUSION
50.0000 ug | INTRAVENOUS | Status: DC | PRN
  Administered 2022-07-01: 100 ug via INTRAVENOUS

## 2022-06-30 MED ORDER — KETAMINE HCL 50 MG/5ML IJ SOSY
PREFILLED_SYRINGE | INTRAMUSCULAR | Status: AC
  Filled 2022-06-30: qty 10

## 2022-06-30 MED ORDER — MIDODRINE HCL 5 MG PO TABS
15.0000 mg | ORAL_TABLET | Freq: Three times a day (TID) | ORAL | Status: DC
  Administered 2022-06-30 (×2): 15 mg
  Filled 2022-06-30 (×2): qty 3

## 2022-06-30 MED ORDER — DOCUSATE SODIUM 50 MG/5ML PO LIQD
100.0000 mg | Freq: Two times a day (BID) | ORAL | Status: DC
  Administered 2022-06-30 – 2022-07-02 (×6): 100 mg
  Filled 2022-06-30 (×6): qty 10

## 2022-06-30 MED ORDER — DEXTROSE 50 % IV SOLN
0.5000 | Freq: Once | INTRAVENOUS | Status: AC
  Administered 2022-06-30: 25 mL via INTRAVENOUS
  Filled 2022-06-30: qty 50

## 2022-06-30 MED ORDER — VANCOMYCIN HCL 1250 MG/250ML IV SOLN
1250.0000 mg | INTRAVENOUS | Status: DC
  Administered 2022-06-30 – 2022-07-02 (×3): 1250 mg via INTRAVENOUS
  Filled 2022-06-30 (×4): qty 250

## 2022-06-30 MED ORDER — FOLIC ACID 1 MG PO TABS
1.0000 mg | ORAL_TABLET | Freq: Every day | ORAL | Status: DC
  Administered 2022-06-30 – 2022-07-03 (×4): 1 mg
  Filled 2022-06-30 (×4): qty 1

## 2022-06-30 MED ORDER — LACTULOSE 10 GM/15ML PO SOLN
30.0000 g | Freq: Three times a day (TID) | ORAL | Status: DC
  Administered 2022-06-30 – 2022-07-03 (×10): 30 g
  Filled 2022-06-30 (×10): qty 45

## 2022-06-30 MED ORDER — SODIUM CHLORIDE 0.9% IV SOLUTION: Freq: Once | INTRAVENOUS | Status: AC

## 2022-06-30 MED ORDER — DEXTROSE 10 % IV SOLN: INTRAVENOUS | Status: DC

## 2022-06-30 MED ORDER — MEDIHONEY WOUND/BURN DRESSING EX PSTE
1.0000 | PASTE | Freq: Every day | CUTANEOUS | Status: DC
  Administered 2022-07-01 – 2022-07-03 (×3): 1 via TOPICAL
  Filled 2022-06-30: qty 44

## 2022-06-30 MED ORDER — PHENYLEPHRINE 80 MCG/ML (10ML) SYRINGE FOR IV PUSH (FOR BLOOD PRESSURE SUPPORT)
800.0000 ug | PREFILLED_SYRINGE | Freq: Once | INTRAVENOUS | Status: AC
  Administered 2022-06-30: 800 ug via INTRAVENOUS

## 2022-06-30 MED ORDER — AMIODARONE HCL IN DEXTROSE 360-4.14 MG/200ML-% IV SOLN
INTRAVENOUS | Status: AC
  Filled 2022-06-30: qty 200

## 2022-06-30 MED ORDER — POLYETHYLENE GLYCOL 3350 17 G PO PACK
17.0000 g | PACK | Freq: Every day | ORAL | Status: DC
  Administered 2022-07-01 – 2022-07-02 (×2): 17 g
  Filled 2022-06-30 (×2): qty 1

## 2022-06-30 MED ORDER — MIDODRINE HCL 5 MG PO TABS: 10.0000 mg | ORAL_TABLET | Freq: Three times a day (TID) | ORAL | Status: DC

## 2022-06-30 MED ORDER — HEPARIN SODIUM (PORCINE) 1000 UNIT/ML DIALYSIS
1000.0000 [IU] | INTRAMUSCULAR | Status: DC | PRN
  Administered 2022-06-30: 2400 [IU] via INTRAVENOUS_CENTRAL
  Filled 2022-06-30 (×2): qty 3
  Filled 2022-06-30: qty 6

## 2022-06-30 MED ORDER — MIDAZOLAM HCL 2 MG/2ML IJ SOLN
1.0000 mg | INTRAMUSCULAR | Status: DC | PRN
  Administered 2022-06-30 – 2022-07-01 (×2): 2 mg via INTRAVENOUS
  Filled 2022-06-30 (×2): qty 2

## 2022-06-30 MED ORDER — SODIUM CHLORIDE 0.9 % FOR CRRT: INTRAVENOUS_CENTRAL | Status: DC | PRN

## 2022-06-30 MED ORDER — THIAMINE MONONITRATE 100 MG PO TABS
100.0000 mg | ORAL_TABLET | Freq: Every day | ORAL | Status: DC
  Administered 2022-06-30 – 2022-07-03 (×4): 100 mg
  Filled 2022-06-30 (×4): qty 1

## 2022-06-30 MED ORDER — INSULIN ASPART 100 UNIT/ML IV SOLN
5.0000 [IU] | Freq: Once | INTRAVENOUS | Status: AC
  Administered 2022-06-30: 5 [IU] via INTRAVENOUS

## 2022-06-30 MED ORDER — PANTOPRAZOLE SODIUM 40 MG IV SOLR
40.0000 mg | Freq: Two times a day (BID) | INTRAVENOUS | Status: DC
  Administered 2022-06-30 – 2022-07-03 (×7): 40 mg via INTRAVENOUS
  Filled 2022-06-30 (×7): qty 10

## 2022-06-30 MED ORDER — CALCIUM GLUCONATE-NACL 2-0.675 GM/100ML-% IV SOLN
2.0000 g | Freq: Once | INTRAVENOUS | Status: AC
  Administered 2022-06-30: 2000 mg via INTRAVENOUS
  Filled 2022-06-30: qty 100

## 2022-06-30 MED ORDER — VASOPRESSIN 20 UNITS/100 ML INFUSION FOR SHOCK
0.0000 [IU]/min | INTRAVENOUS | Status: DC
  Administered 2022-06-30 – 2022-07-03 (×6): 0.03 [IU]/min via INTRAVENOUS
  Filled 2022-06-30 (×8): qty 100

## 2022-06-30 MED ORDER — FUROSEMIDE 10 MG/ML IJ SOLN
40.0000 mg | Freq: Once | INTRAMUSCULAR | Status: AC
  Administered 2022-06-30: 40 mg via INTRAVENOUS
  Filled 2022-06-30: qty 4

## 2022-06-30 MED ORDER — ROCURONIUM BROMIDE 10 MG/ML (PF) SYRINGE
PREFILLED_SYRINGE | INTRAVENOUS | Status: AC
  Administered 2022-06-30: 100 mg
  Filled 2022-06-30: qty 10

## 2022-06-30 MED ORDER — SODIUM ZIRCONIUM CYCLOSILICATE 10 G PO PACK: 10.0000 g | PACK | Freq: Once | ORAL | Status: DC

## 2022-06-30 MED ORDER — FENTANYL 2500MCG IN NS 250ML (10MCG/ML) PREMIX INFUSION
50.0000 ug/h | INTRAVENOUS | Status: DC
  Administered 2022-06-30: 50 ug/h via INTRAVENOUS
  Filled 2022-06-30: qty 250

## 2022-06-30 MED ORDER — DEXTROSE 50 % IV SOLN
1.0000 | Freq: Once | INTRAVENOUS | Status: AC
  Administered 2022-06-30: 50 mL via INTRAVENOUS
  Filled 2022-06-30: qty 50

## 2022-06-30 MED ORDER — PRISMASOL BGK 0/2.5 32-2.5 MEQ/L EC SOLN
Status: DC
  Filled 2022-06-30 (×15): qty 5000

## 2022-06-30 MED ORDER — SODIUM CHLORIDE 0.9 % IV SOLN: INTRAVENOUS | Status: DC | PRN

## 2022-06-30 MED ORDER — FENTANYL CITRATE PF 50 MCG/ML IJ SOSY
PREFILLED_SYRINGE | INTRAMUSCULAR | Status: AC
  Filled 2022-06-30: qty 2

## 2022-06-30 MED ORDER — RENA-VITE PO TABS
1.0000 | ORAL_TABLET | Freq: Every day | ORAL | Status: DC
  Administered 2022-06-30 – 2022-07-02 (×3): 1
  Filled 2022-06-30 (×3): qty 1

## 2022-06-30 MED ORDER — SODIUM ZIRCONIUM CYCLOSILICATE 10 G PO PACK
10.0000 g | PACK | Freq: Once | ORAL | Status: AC
  Administered 2022-06-30: 10 g
  Filled 2022-06-30: qty 1

## 2022-06-30 MED ORDER — AMIODARONE LOAD VIA INFUSION
150.0000 mg | Freq: Once | INTRAVENOUS | Status: AC
  Administered 2022-06-30: 150 mg via INTRAVENOUS
  Filled 2022-06-30: qty 83.34

## 2022-06-30 MED ORDER — HYDROCORTISONE SOD SUC (PF) 100 MG IJ SOLR
100.0000 mg | Freq: Three times a day (TID) | INTRAMUSCULAR | Status: DC
  Administered 2022-06-30 – 2022-07-01 (×3): 100 mg via INTRAVENOUS
  Filled 2022-06-30 (×3): qty 2

## 2022-06-30 MED ORDER — SODIUM BICARBONATE 8.4 % IV SOLN
INTRAVENOUS | Status: AC
  Administered 2022-06-30: 50 meq
  Filled 2022-06-30: qty 50

## 2022-06-30 NOTE — Procedures (Signed)
Intubation Procedure Note  Kyle Pitts  [id number]  Jul 09, 1960  Date:06/30/22  Time:10:50 AM   Provider Performing:Kyle Pitts    Procedure: Intubation (31500)  Indication(s) Respiratory Failure  Consent Unable to obtain consent due to emergent nature of procedure.   Anesthesia Etomidate, Versed, Fentanyl, and Rocuronium   Time Out Verified patient identification, verified procedure, site/side was marked, verified correct patient position, special equipment/implants available, medications/allergies/relevant history reviewed, required imaging and test results available.   Sterile Technique Usual hand hygeine, masks, and gloves were used   Procedure Description Patient positioned in bed supine.  Sedation given as noted above.  Patient was intubated with endotracheal tube using Glidescope.  View was Grade 1 full glottis .  Number of attempts was 1.  Colorimetric CO2 detector was consistent with tracheal placement.   Complications/Tolerance None; patient tolerated the procedure well. Chest X-ray is ordered to verify placement.   EBL Minimal   Specimen(s) None  Kyle Garfinkel MD [facility] See Amion for pager  If no response to pager , please call [phone] until 7pm After 7:00 pm call Elink  [phone] 06/30/2022, 10:50 AM

## 2022-06-30 NOTE — Consult Note (Signed)
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 When possible would obtain MRI and or ask orthopedics to aspirate knee for cell count > GS and culture--orthopedics is seeing Would dc cefepime as I do not see clear indication for GNR coverage  Principal Problem:   MRSA bacteremia Active Problems:   Acute metabolic encephalopathy   Septic arthritis ([place])   Septic shock ([place])   Scheduled Meds:  sodium chloride   Intravenous Once   Chlorhexidine Gluconate Cloth  6 each Topical Daily   docusate  100 mg Per Tube BID   fentaNYL       fentaNYL (SUBLIMAZE) injection  50 mcg Intravenous Once   folic acid  1 mg Per Tube Daily   hydrocortisone sod succinate (SOLU-CORTEF) inj  100 mg Intravenous Q8H   lactulose  30 g Per Tube TID   leptospermum manuka honey  1 Application Topical Daily   midodrine  15 mg Per Tube TID WC   pantoprazole (PROTONIX) IV  40 mg Intravenous Q12H   polyethylene glycol  17 g Per Tube Daily   sodium bicarbonate       sodium zirconium cyclosilicate  10 g  Per Tube Once   succinylcholine       thiamine  100 mg Per Tube Daily   Continuous Infusions:  sodium chloride     sodium chloride     calcium gluconate     dextrose 20 mL/hr at 06/30/22 0736   fentaNYL infusion INTRAVENOUS 50 mcg/hr (06/30/22 1101)   norepinephrine (LEVOPHED) Adult infusion 20 mcg/min (06/30/22 1024)   prismasol BGK 2/2.5 dialysis solution     prismasol BGK 2/2.5 replacement solution     prismasol BGK 2/2.5 replacement solution     sodium bicarbonate 150 mEq in dextrose 5 % 1,150 mL infusion 100 mL/hr at 06/30/22 0732   vancomycin Stopped (07/10/2022 1754)   vasopressin 0.03 Units/min (06/30/22 0950)   PRN Meds:.Place/Maintain arterial line **AND** sodium chloride, fentaNYL, fentaNYL, heparin, midazolam, ondansetron (ZOFRAN) IV, sodium bicarbonate, sodium chloride, succinylcholine  HPI: Kyle Pitts is a 62 y.o. male with history of alcoholic cirrhosis with esophageal varices status post TIPS in 2023 with recurrent ascites requiring serial paracenteses, history of septic arthritis of the right knee due to group A streptococcus status post I&D on 09/27/2020 with anterior and ateral compartment fasciotomy along with low right lower leg irrigation and debridement on 10/09/20, who was found down at home when his sister noted that he had not been checking in with her (normally was providing care for  the sister and checking in on her daily basis)/EMS were called and found his sugars were in the 20s.  In the ER he was confused and had multiple metabolic derangements acute renal failure creatinine of 5.42 lactic acid of 5.9 hyponatremia hyperkalemia.  He had acute worsening of his thrombocytopenia with platelets in the 40,000 range and leukocytosis with 20,200 white blood cells with neutrophilic predominance.  UDS + cocaine and opiates.  He had CT head which was unrevealing.  CT abdomen pelvis which showed resolution of abdominal ascites cholelithiasis and some scattered airspace  opacities in the lung bases.  Plain films of the knee were obtained which showed progressive degenerative changes and a large knee effusion.  No bony changes were seen on plain films though previously MRI of 20,022 would raise concern for potential osteomyelitis.  Blood cultures were taken and he was started on vancomycin and cefepime and these turned positive for MRSA in 2/2 sites.  He has required intubation due to respiratory failure is going to require CRRT.  Multiple lines have been placed.  I do not think he needs gram-negative coverage whatsoever I think MRSA bacteremia with septic shock explains the entirety of his picture.  I am concerned that his knee could be the source of his infection given the large effusion present.  I spent 84 minutes with the patient including than 50% of the time in face to face and non face to face time with the patient personally reviewing along CT head CT abdomen pelvis plain films along with review of medical records in preparation for the visit and during the visit and in coordination of nis care.       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   Septic arthritis ([facility])    Streptococcus infection, group A 11/21/2020    Social History   Tobacco Use   Smoking status: Never    Passive exposure: Never   Smokeless tobacco: Never  Vaping Use   Vaping Use: Never used  Substance Use Topics   Alcohol use: Not Currently   Drug use: Not Currently    Family History  Problem Relation Age of Onset   Hepatitis Mother    Cirrhosis Mother    Stroke Father    Heart failure Father    Colon cancer  Father    Kidney failure Brother    Liver cancer Brother    Stomach cancer Neg Hx    Esophageal cancer Neg Hx    Colon polyps Neg Hx    Crohn's disease Neg Hx    Rectal cancer Neg Hx    Ulcerative colitis Neg Hx    Allergies  Allergen Reactions   Latex Itching   Tape Itching   Amoxicillin Itching    Itching around eyes and scalp on amoxicilin   Cephalexin Nausea Only    Upset stomach Ceftriaxone is ok   Penicillins Rash    OBJECTIVE: Blood pressure (!) 124/50, pulse 85, temperature 97.9 F (36.6 C), temperature source Bladder, resp. rate 17, height 6' (1.829 m), weight 106 kg, SpO2 100 %.  Physical Exam Vitals reviewed.  Constitutional:      Appearance: He is ill-appearing.     Interventions: He is intubated.  HENT:     Head: Normocephalic and atraumatic.  Eyes:     General:        Right eye: No discharge.        Left eye: No discharge.     Conjunctiva/sclera: Conjunctivae normal.  Cardiovascular:     Rate and Rhythm: Tachycardia present.     Heart sounds: No murmur heard.    No friction rub. No gallop.  Pulmonary:     Effort: He is intubated.  Abdominal:     General: There is no distension.  Musculoskeletal:     Right knee: Swelling and effusion present.  Skin:    Coloration: Skin is pale.    Right leg wound 06/30/2022:       Lab Results Lab Results  Component Value Date   WBC 36.8 (H) 06/30/2022   HGB 9.2 (L) 06/30/2022   HCT 27.0 (L) 06/30/2022   MCV 103.3 (H) 06/30/2022   PLT 32 (L) 06/30/2022    Lab Results  Component Value Date   CREATININE 5.88 (H) 06/30/2022   BUN 66 (H) 06/30/2022   NA 127 (L) 06/30/2022   K 5.5 (H) 06/30/2022   CL 96 (L) 06/30/2022   CO2 15 (L) 06/30/2022    Lab Results  Component Value Date   ALT 36 06/30/2022   AST 136 (H) 06/30/2022   ALKPHOS 274 (H) 06/30/2022   BILITOT 10.0 (H) 06/30/2022     Microbiology: Recent Results (from the past 240 hour(s))  Culture, blood (routine x 2)     Status: None  (Preliminary result)   Collection Time: 06/28/2022  2:22 PM   Specimen: BLOOD  Result Value Ref Range Status   Specimen Description BLOOD SITE NOT SPECIFIED  Final   Special Requests   Final    BOTTLES DRAWN AEROBIC AND ANAEROBIC Blood Culture results may not be optimal due to an inadequate volume of blood received in culture bottles   Culture  Setup Time   Final    GRAM POSITIVE COCCI IN CLUSTERS IN BOTH AEROBIC AND ANAEROBIC BOTTLES CRITICAL RESULT CALLED TO, READ BACK BY AND VERIFIED WITH: PHARMD G ABBOTT 06/30/22 @ 0510 BY AB Performed at [facility], [place] [address], [place], [place] [address]    Culture GRAM POSITIVE COCCI  Final   Report Status PENDING  Incomplete  Blood Culture ID Panel (Reflexed)     Status: Abnormal   Collection Time: 06/22/2022  2:22 PM  Result Value Ref Range Status   Enterococcus faecalis NOT DETECTED NOT DETECTED Final   Enterococcus Faecium NOT DETECTED NOT DETECTED Final   Listeria monocytogenes NOT DETECTED NOT DETECTED Final   Staphylococcus species DETECTED (A) NOT DETECTED Final    Comment: CRITICAL RESULT CALLED TO, READ BACK BY AND VERIFIED WITH: PHARMD G ABBOTT 06/30/22 @ 0510 BY AB    Staphylococcus aureus (BCID) DETECTED (A) NOT DETECTED Final    Comment: Methicillin (oxacillin)-resistant Staphylococcus aureus (MRSA). MRSA is predictably resistant to beta-lactam antibiotics (except ceftaroline). Preferred therapy is vancomycin unless clinically contraindicated. Patient requires contact precautions if  hospitalized. CRITICAL RESULT CALLED TO, READ BACK BY AND VERIFIED WITH: PHARMD G ABBOTT 06/30/22 @ 0510 BY AB    Staphylococcus epidermidis NOT DETECTED NOT DETECTED Final   Staphylococcus lugdunensis NOT DETECTED NOT DETECTED Final   Streptococcus species NOT DETECTED NOT DETECTED Final  Streptococcus agalactiae NOT DETECTED NOT DETECTED Final   Streptococcus pneumoniae NOT DETECTED NOT DETECTED Final   Streptococcus pyogenes NOT DETECTED  NOT DETECTED Final   A.calcoaceticus-baumannii NOT DETECTED NOT DETECTED Final   Bacteroides fragilis NOT DETECTED NOT DETECTED Final   Enterobacterales NOT DETECTED NOT DETECTED Final   Enterobacter cloacae complex NOT DETECTED NOT DETECTED Final   Escherichia coli NOT DETECTED NOT DETECTED Final   Klebsiella aerogenes NOT DETECTED NOT DETECTED Final   Klebsiella oxytoca NOT DETECTED NOT DETECTED Final   Klebsiella pneumoniae NOT DETECTED NOT DETECTED Final   Proteus species NOT DETECTED NOT DETECTED Final   Salmonella species NOT DETECTED NOT DETECTED Final   Serratia marcescens NOT DETECTED NOT DETECTED Final   Haemophilus influenzae NOT DETECTED NOT DETECTED Final   Neisseria meningitidis NOT DETECTED NOT DETECTED Final   Pseudomonas aeruginosa NOT DETECTED NOT DETECTED Final   Stenotrophomonas maltophilia NOT DETECTED NOT DETECTED Final   Candida albicans NOT DETECTED NOT DETECTED Final   Candida auris NOT DETECTED NOT DETECTED Final   Candida glabrata NOT DETECTED NOT DETECTED Final   Candida krusei NOT DETECTED NOT DETECTED Final   Candida parapsilosis NOT DETECTED NOT DETECTED Final   Candida tropicalis NOT DETECTED NOT DETECTED Final   Cryptococcus neoformans/gattii NOT DETECTED NOT DETECTED Final   Meth resistant mecA/C and MREJ DETECTED (A) NOT DETECTED Final    Comment: CRITICAL RESULT CALLED TO, READ BACK BY AND VERIFIED WITH: PHARMD G ABBOTT 06/30/22 @ 0510 BY AB Performed at [facility], [address], [place], [place] [address]   Resp panel by RT-PCR (RSV, Flu A&B, Covid) Anterior Nasal Swab     Status: None   Collection Time: 06/28/2022  2:27 PM   Specimen: Anterior Nasal Swab  Result Value Ref Range Status   SARS Coronavirus 2 by RT PCR NEGATIVE NEGATIVE Final   Influenza A by PCR NEGATIVE NEGATIVE Final   Influenza B by PCR NEGATIVE NEGATIVE Final    Comment: (NOTE) The Xpert Xpress SARS-CoV-2/FLU/RSV plus assay is intended as an aid in the diagnosis of  influenza from Nasopharyngeal swab specimens and should not be used as a sole basis for treatment. Nasal washings and aspirates are unacceptable for Xpert Xpress SARS-CoV-2/FLU/RSV testing.  Fact Sheet for Patients: EntrepreneurPulse.com.au  Fact Sheet for Healthcare Providers: IncredibleEmployment.be  This test is not yet approved or cleared by the [place] FDA and has been authorized for detection and/or diagnosis of SARS-CoV-2 by FDA under an Emergency Use Authorization (EUA). This EUA will remain in effect (meaning this test can be used) for the duration of the COVID-19 declaration under Section 564(b)(1) of the Act, 21 U.S.C. section 360bbb-3(b)(1), unless the authorization is terminated or revoked.     Resp Syncytial Virus by PCR NEGATIVE NEGATIVE Final    Comment: (NOTE) Fact Sheet for Patients: EntrepreneurPulse.com.au  Fact Sheet for Healthcare Providers: IncredibleEmployment.be  This test is not yet approved or cleared by the [place] FDA and has been authorized for detection and/or diagnosis of SARS-CoV-2 by FDA under an Emergency Use Authorization (EUA). This EUA will remain in effect (meaning this test can be used) for the duration of the COVID-19 declaration under Section 564(b)(1) of the Act, 21 U.S.C. section 360bbb-3(b)(1), unless the authorization is terminated or revoked.  Performed at [facility], [place] [address], [place], [place] [address]   Culture, blood (routine x 2)     Status: None (Preliminary result)   Collection Time: 06/27/2022  4:57  PM   Specimen: BLOOD RIGHT HAND  Result Value Ref Range Status   Specimen Description BLOOD RIGHT HAND  Final   Special Requests   Final    BOTTLES DRAWN AEROBIC AND ANAEROBIC Blood Culture results may not be optimal due to an inadequate volume of blood received in culture bottles   Culture  Setup Time   Final    GRAM POSITIVE  COCCI IN CLUSTERS IN BOTH AEROBIC AND ANAEROBIC BOTTLES CRITICAL VALUE NOTED.  VALUE IS CONSISTENT WITH PREVIOUSLY REPORTED AND CALLED VALUE. Performed at [facility], [place] [address], [place], [place] [address]    Culture [facility] POSITIVE COCCI  Final   Report Status PENDING  Incomplete    Alcide Evener, [place] [facility] [phone] pager  06/30/2022, 11:02 AM

## 2022-06-30 NOTE — Progress Notes (Signed)
Initial Nutrition Assessment  DOCUMENTATION CODES:   Not applicable  INTERVENTION:   Initiate enteral nutrition via OG tube: Start trickle feeds of Vital 1.5 at 20 mL/hr; as able recommend advancing by 10 mL q8h to goal rate of 60 mL/hr  60 mL ProSource TF20 - TID Goal regimen provides 2400 kcal, 157 gm protein, and 1100 mL free water daily Renal Multivitamin w/ minerals daily  NUTRITION DIAGNOSIS:   Inadequate oral intake related to inability to eat as evidenced by NPO status.  GOAL:   Patient will meet greater than or equal to 90% of their needs  MONITOR:   Labs, Vent status, Skin, TF tolerance, Weight trends, I & O's  REASON FOR ASSESSMENT:   Ventilator    ASSESSMENT:   62 y.o. male presented to the ED after found down at home. PMH includes EtOH abuse, cirrhosis with esophageal varices and ascites s/p TIPS. Pt admitted with septic shock, AKI, hyperkalemia, hyponatremia, metabolic encephalopathy, and hypoglycemia.  3/11 - Admitted 3/12 - Intubated  No family at bedside. Per note, plan to start CRRT today. RN unsure any plans for tube feeds. Mouth full of blood, RN reports that happened during HD cath placement.  Reached out to CCM NP about starting tube feeds, ok with starting trickle feeds at this time.  Patient is currently intubated on ventilator support.  MV: 15.8 L/min MAP (a-line): 69 Temp (24hrs), Avg:97.3 F (36.3 C), Min:94.9 F (34.9 C), Max:99.3 F (37.4 C)  Drips D10 Fentanyl Levophed Vasopressin  Medications reviewed and include: Colace, Folic Acid, Solu-cortef, Lactulose, Protonix, Miralax, Lokelma, Thiamine Labs reviewed: Sodium 127, Potassium 5.5, BUN 66, Creatinine 5.88, Phosphorus 5.1, Magnesium 1.8, 24 hr CBGs <10-422  NUTRITION - FOCUSED PHYSICAL EXAM:  Flowsheet Row Most Recent Value  Orbital Region Unable to assess  Upper Arm Region No depletion  Thoracic and Lumbar Region No depletion  Buccal Region Unable to assess  Temple  Region No depletion  Clavicle Bone Region No depletion  Clavicle and Acromion Bone Region No depletion  Scapular Bone Region No depletion  Dorsal Hand Unable to assess  Patellar Region No depletion  Anterior Thigh Region No depletion  Posterior Calf Region No depletion  Edema (RD Assessment) Mild  Hair Reviewed  Eyes Unable to assess  Mouth Unable to assess  Skin Reviewed  Nails Unable to assess   Diet Order:   Diet Order             Diet NPO time specified  Diet effective now                  EDUCATION NEEDS:   Not appropriate for education at this time  Skin:  Skin Assessment: Reviewed RN Assessment  Last BM:  Unknown  Height:  Ht Readings from Last 1 Encounters:  06/29/22 6' (1.829 m)   Weight:  Wt Readings from Last 1 Encounters:  06/30/22 106 kg   Ideal Body Weight:  80.9 kg  BMI:  Body mass index is 31.69 kg/m.  Estimated Nutritional Needs:  Kcal:  [id number] Protein:  140-160 grams Fluid:  >/= 2 L   Hermina Barters RD, LDN Clinical Dietitian See [facility] for contact information.

## 2022-06-30 NOTE — Progress Notes (Signed)
PHARMACY - PHYSICIAN COMMUNICATION CRITICAL VALUE ALERT - BLOOD CULTURE IDENTIFICATION (BCID)  Kyle Pitts is an 62 y.o. male who presented to [facility] on 06/29/2022 with a chief complaint of AMS/septic shock  Assessment:   1/2 blood cultures growing MRSA  Name of physician (or Provider) Contacted:  Dr. Dorian Pod  Current antibiotics:  Vancomycin and Cefepime   Changes to prescribed antibiotics recommended:  No changes at this time.  Consider d/c Cefepime  Results for orders placed or performed during the hospital encounter of 06/29/22  Blood Culture ID Panel (Reflexed) (Collected: 06/29/2022  2:22 PM)  Result Value Ref Range   Enterococcus faecalis NOT DETECTED NOT DETECTED   Enterococcus Faecium NOT DETECTED NOT DETECTED   Listeria monocytogenes NOT DETECTED NOT DETECTED   Staphylococcus species DETECTED (A) NOT DETECTED   Staphylococcus aureus (BCID) DETECTED (A) NOT DETECTED   Staphylococcus epidermidis NOT DETECTED NOT DETECTED   Staphylococcus lugdunensis NOT DETECTED NOT DETECTED   Streptococcus species NOT DETECTED NOT DETECTED   Streptococcus agalactiae NOT DETECTED NOT DETECTED   Streptococcus pneumoniae NOT DETECTED NOT DETECTED   Streptococcus pyogenes NOT DETECTED NOT DETECTED   A.calcoaceticus-baumannii NOT DETECTED NOT DETECTED   Bacteroides fragilis NOT DETECTED NOT DETECTED   Enterobacterales NOT DETECTED NOT DETECTED   Enterobacter cloacae complex NOT DETECTED NOT DETECTED   Escherichia coli NOT DETECTED NOT DETECTED   Klebsiella aerogenes NOT DETECTED NOT DETECTED   Klebsiella oxytoca NOT DETECTED NOT DETECTED   Klebsiella pneumoniae NOT DETECTED NOT DETECTED   Proteus species NOT DETECTED NOT DETECTED   Salmonella species NOT DETECTED NOT DETECTED   Serratia marcescens NOT DETECTED NOT DETECTED   Haemophilus influenzae NOT DETECTED NOT DETECTED   Neisseria meningitidis NOT DETECTED NOT DETECTED   Pseudomonas aeruginosa NOT DETECTED NOT DETECTED    Stenotrophomonas maltophilia NOT DETECTED NOT DETECTED   Candida albicans NOT DETECTED NOT DETECTED   Candida auris NOT DETECTED NOT DETECTED   Candida glabrata NOT DETECTED NOT DETECTED   Candida krusei NOT DETECTED NOT DETECTED   Candida parapsilosis NOT DETECTED NOT DETECTED   Candida tropicalis NOT DETECTED NOT DETECTED   Cryptococcus neoformans/gattii NOT DETECTED NOT DETECTED   Meth resistant mecA/C and MREJ DETECTED (A) NOT DETECTED    [name] 06/30/2022  5:17 AM

## 2022-06-30 NOTE — Progress Notes (Signed)
Spoke with primary RN about PICC placement. Patient with GFR of 11 at this time and positive blood cultures. Would recommend temp.CVC be placed if central access is needed now and revisit PICC placement with improvement of GFR. Primary RN to notify MD.

## 2022-06-30 NOTE — Procedures (Signed)
Central Venous Catheter Insertion Procedure Note  Kyle Pitts  GN:[id number]  [birth year]  Date:06/30/22  Time:12:24 PM   Provider Performing:Brooke Moshe Cipro   Procedure: Insertion of Non-tunneled Central Venous Catheter([id number])with US guidance [id number])    Indication(s) Hemodialysis  Consent Risks of the procedure as well as the alternatives and risks of each were explained to the patient and/or caregiver.  Consent for the procedure was obtained and is signed in the bedside chart  Anesthesia Topical only with 1% lidocaine   Timeout Verified patient identification, verified procedure, site/side was marked, verified correct patient position, special equipment/implants available, medications/allergies/relevant history reviewed, required imaging and test results available.  Sterile Technique Maximal sterile technique including full sterile barrier drape, hand hygiene, sterile gown, sterile gloves, mask, hair covering, sterile ultrasound probe cover (if used).  Procedure Description Area of catheter insertion was cleaned with chlorhexidine and draped in sterile fashion.   With real-time ultrasound guidance a HD catheter 15 cm was placed into the right internal jugular vein.  Nonpulsatile blood flow and easy flushing noted in all ports.  The catheter was sutured in place and sterile dressing applied.    Complications/Tolerance None; patient tolerated the procedure well. Chest X-ray is ordered to verify placement for internal jugular or subclavian cannulation.  Chest x-ray is not ordered for femoral cannulation.  EBL Minimal  Specimen(s) None      Kennieth Rad, MSN, AG-ACNP-BC [facility] 06/30/2022, 12:24 PM  See Amion for pager If no response to pager, please call PCCM consult pager After 7:00 pm call Elink

## 2022-06-30 NOTE — Progress Notes (Signed)
Pharmacy Antibiotic Note  Kyle Pitts is a 62 y.o. male admitted on 06/29/2022 with bacteremia.  Pharmacy has been consulted for vancomycin dosing. WBC 36.8 now afebrile with elevated lactic acid on escalating vasopressor support (NE @ 20; vaso'@0'$ .03) after fluid resuscitation.   VR this AM 9 mcg/mL - subtherapeutic   Plan: Redose vancomycin '1250mg'$  IV q24h in anticipation of beginning CRRT on 3/12 -vanc random tomorrow AM for re-dosing  -vanc levels PRN per protocol Cont other abx per MD, pending ID recs   Height: 6' (182.9 cm) Weight: 106 kg (233 lb 11 oz) IBW/kg (Calculated) : 77.6  Temp (24hrs), Avg:96.1 F (35.6 C), Min:93.2 F (34 C), Max:99.3 F (37.4 C)  Recent Labs  Lab 06/29/22 1353 06/29/22 1410 06/29/22 1805 06/29/22 1930 06/29/22 2010 06/29/22 2220 06/30/22 0358 06/30/22 0727 06/30/22 1010  WBC 20.2*  --   --   --   --   --  36.8*  --   --   CREATININE 5.42* 6.10*  --  5.26*  --   --  5.68* 5.88*  --   LATICACIDVEN 5.7*  --  7.0*  --  6.6* 5.7*  --   --   --   VANCORANDOM  --   --   --   --   --   --   --   --  9    Estimated Creatinine Clearance: 16.8 mL/min (A) (by C-G formula based on SCr of 5.88 mg/dL (H)).    Allergies  Allergen Reactions   Latex Itching   Tape Itching   Amoxicillin Itching    Itching around eyes and scalp on amoxicilin   Cephalexin Nausea Only    Upset stomach Ceftriaxone is ok   Penicillins Rash    Antimicrobials this admission: 3/11 vanc> 3/11 cefepime>  Dose adjustments this admission:   Microbiology results: 3/11 Bcx: 4/4 MRSA 3/11 RVP: neg 3/12 Bcx: pending  Thank you for allowing pharmacy to be a part of this patient's care.  Wilson Singer, PharmD Clinical Pharmacist 06/30/2022 11:14 AM

## 2022-06-30 NOTE — Consult Note (Signed)
[facility] Renal Consultation Note  Requesting MD: Mannem Indication for Consultation: AKI  HPI:  Kyle Pitts is a 62 y.o. male with past medical history significant for cirrhosis s/p TIPS in the past with good clinical outcome- on meds to include lactulose, aldactone, lasix and midodrine-  crt noted to be 0.9 in December of 23.  Pt presented to ER after being found down with multiple metabolic derangements including crt of 5.4.  He was hypotensive and hypothermic-  opiates and cocaine found in his UDS and then was founds to have a MRSA bacteremia.  Has been treated with abx and pressor support overnight but renal function noted to worsen-  is 5.8 today also with a k over 6, sodium of [id number] and metabolic acidosis.  He has just required intubation and increase of pressors-  we are asked to support with CRRT-  also on note was on mobic prior to admit.  History taking is not possible at this time and no family present at bedside-  will be aggressively treating as pt was in good functional status prior to admit   Creat  Date/Time Value Ref Range Status  11/21/2020 03:35 PM 0.52 (L) 0.70 - 1.30 mg/dL Final   Creatinine, Ser  Date/Time Value Ref Range Status  06/30/2022 07:27 AM 5.88 (H) 0.61 - 1.24 mg/dL Final  06/30/2022 03:58 AM 5.68 (H) 0.61 - 1.24 mg/dL Final  07/11/2022 07:30 PM 5.26 (H) 0.61 - 1.24 mg/dL Final  07/06/2022 02:10 PM 6.10 (H) 0.61 - 1.24 mg/dL Final  07/11/2022 01:53 PM 5.42 (H) 0.61 - 1.24 mg/dL Final  04/01/2022 01:14 PM 0.93 0.40 - 1.50 mg/dL Final  02/13/2022 10:26 AM 1.04 0.40 - 1.50 mg/dL Final  01/19/2022 10:51 AM 1.02 0.40 - 1.50 mg/dL Final  10/07/2021 03:05 PM 1.43 (H) 0.61 - 1.24 mg/dL Final  09/07/2021 01:04 AM 1.63 (H) 0.61 - 1.24 mg/dL Final  09/06/2021 03:16 AM 1.82 (H) 0.61 - 1.24 mg/dL Final  09/01/2021 03:33 PM 1.92 (H) 0.61 - 1.24 mg/dL Final  08/12/2021 02:24 PM 1.69 (H) 0.40 - 1.50 mg/dL Final  07/24/2021 03:47 PM 1.62 (H) 0.40 - 1.50  mg/dL Final  07/10/2021 02:07 PM 2.02 (H) 0.40 - 1.50 mg/dL Final  06/24/2021 10:43 AM 1.43 0.40 - 1.50 mg/dL Final  05/14/2021 03:46 PM 1.28 0.40 - 1.50 mg/dL Final  04/17/2021 05:08 PM 1.29 (H) 0.61 - 1.24 mg/dL Final  04/10/2021 01:10 AM 1.09 0.61 - 1.24 mg/dL Final  04/09/2021 01:49 AM 1.16 0.61 - 1.24 mg/dL Final  04/08/2021 01:34 AM 1.29 (H) 0.61 - 1.24 mg/dL Final  04/07/2021 02:08 AM 1.54 (H) 0.61 - 1.24 mg/dL Final  04/06/2021 02:05 AM 1.98 (H) 0.61 - 1.24 mg/dL Final  04/05/2021 01:53 AM 2.45 (H) 0.61 - 1.24 mg/dL Final  04/04/2021 03:58 PM 2.46 (H) 0.61 - 1.24 mg/dL Final  03/28/2021 06:48 PM 1.39 (H) 0.61 - 1.24 mg/dL Final  03/27/2021 03:55 PM 1.27 0.40 - 1.50 mg/dL Final  03/21/2021 12:12 AM 1.69 (H) 0.61 - 1.24 mg/dL Final  03/19/2021 05:03 AM 2.11 (H) 0.61 - 1.24 mg/dL Final  03/18/2021 12:15 AM 3.05 (H) 0.61 - 1.24 mg/dL Final  03/17/2021 06:38 AM 3.55 (H) 0.61 - 1.24 mg/dL Final  03/17/2021 06:38 AM 3.44 (H) 0.61 - 1.24 mg/dL Final  03/16/2021 03:23 AM 4.53 (H) 0.61 - 1.24 mg/dL Final  03/15/2021 09:58 AM 4.69 (H) 0.61 - 1.24 mg/dL Final  02/16/2021 08:19 AM 1.00 0.61 - 1.24 mg/dL Final  02/15/2021 02:10  AM 0.85 0.61 - 1.24 mg/dL Final  02/14/2021 01:50 AM 1.14 0.61 - 1.24 mg/dL Final  02/13/2021 07:52 AM 1.88 (H) 0.61 - 1.24 mg/dL Final  02/12/2021 07:03 PM 2.69 (H) 0.61 - 1.24 mg/dL Final  11/11/2020 04:00 AM 0.53 (L) 0.61 - 1.24 mg/dL Final  11/09/2020 05:00 AM 0.37 (L) 0.61 - 1.24 mg/dL Final  11/08/2020 02:10 AM 0.48 (L) 0.61 - 1.24 mg/dL Final  11/07/2020 02:01 PM 0.47 (L) 0.61 - 1.24 mg/dL Final  10/13/2020 05:00 AM 0.58 (L) 0.61 - 1.24 mg/dL Final  10/11/2020 04:59 AM 0.57 (L) 0.61 - 1.24 mg/dL Final  10/10/2020 03:15 AM 0.65 0.61 - 1.24 mg/dL Final  10/09/2020 03:36 AM 0.58 (L) 0.61 - 1.24 mg/dL Final  10/08/2020 05:39 AM 0.76 0.61 - 1.24 mg/dL Final  10/08/2020 01:30 AM 0.85 0.61 - 1.24 mg/dL Final  10/07/2020 06:38 PM 0.95 0.61 - 1.24 mg/dL Final   09/28/2020 03:12 AM 0.98 0.61 - 1.24 mg/dL Final  09/27/2020 06:39 PM 1.62 (H) 0.61 - 1.24 mg/dL Final     PMHx:   Past Medical History:  Diagnosis Date   Allergic reaction 11/21/2020   Anemia    DENIES   Asthma    "SLIGHT"   Blood transfusion without reported diagnosis    Cataract    LEFT EYE,RIGHT EYE REMOVED   Cirrhosis ([place])    Hyponatremia    Myofasciitis 11/21/2020   Osteomyelitis of right leg ([place]) 11/21/2020   Septic arthritis ([employer])    Streptococcus infection, group A 11/21/2020    Past Surgical History:  Procedure Laterality Date   BIOPSY  02/15/2021   Procedure: BIOPSY;  Surgeon: Daryel November, MD;  Location: [facility] ENDOSCOPY;  Service: Gastroenterology;;   ESOPHAGOGASTRODUODENOSCOPY N/A 02/15/2021   Procedure: ESOPHAGOGASTRODUODENOSCOPY (EGD);  Surgeon: Daryel November, MD;  Location: [place];  Service: Gastroenterology;  Laterality: N/A;   ESOPHAGOGASTRODUODENOSCOPY (EGD) WITH PROPOFOL N/A 03/19/2021   Procedure: ESOPHAGOGASTRODUODENOSCOPY (EGD) WITH PROPOFOL;  Surgeon: Sharyn Creamer, MD;  Location: [place];  Service: Gastroenterology;  Laterality: N/A;   HEMOSTASIS CLIP PLACEMENT  03/19/2021   Procedure: HEMOSTASIS CLIP PLACEMENT;  Surgeon: Sharyn Creamer, MD;  Location: [facility] ENDOSCOPY;  Service: Gastroenterology;;   I & D EXTREMITY Right 10/09/2020   Procedure: IRRIGATION AND DEBRIDEMENT EXTREMITY;  Surgeon: Nicholes Stairs, MD;  Location: [facility];  Service: Orthopedics;  Laterality: Right;   IR IVUS EACH ADDITIONAL NON CORONARY VESSEL  09/05/2021   IR PARACENTESIS  03/18/2021   IR PARACENTESIS  04/09/2021   IR PARACENTESIS  04/22/2021   IR PARACENTESIS  05/02/2021   IR PARACENTESIS  05/16/2021   IR PARACENTESIS  05/29/2021   IR PARACENTESIS  06/13/2021   IR PARACENTESIS  06/25/2021   IR PARACENTESIS  07/04/2021   IR PARACENTESIS  07/16/2021   IR PARACENTESIS  07/25/2021   IR PARACENTESIS  07/31/2021   IR PARACENTESIS  08/06/2021   IR PARACENTESIS   08/14/2021   IR PARACENTESIS  08/21/2021   IR PARACENTESIS  08/29/2021   IR PARACENTESIS  09/05/2021   IR PARACENTESIS  09/24/2021   IR PARACENTESIS  10/07/2021   IR PARACENTESIS  10/23/2021   IR RADIOLOGIST EVAL & MGMT  08/11/2021   IR RADIOLOGIST EVAL & MGMT  10/14/2021   IR RADIOLOGIST EVAL & MGMT  12/15/2021   IR RADIOLOGIST EVAL & MGMT  03/16/2022   IR TIPS  09/05/2021   IR US GUIDE VASC ACCESS RIGHT  09/05/2021   KNEE ARTHROSCOPY Right 09/27/2020  Procedure: ARTHROSCOPY KNEE, [place] OUT;  Surgeon: Melina Schools, MD;  Location[facility];  Service: Orthopedics;  Laterality: Right;   KNEE ARTHROSCOPY Right 10/09/2020   Procedure: ARTHROSCOPY KNEE,ARTHROSCOPIC I & D, CALF ABSCESS;  Surgeon: Nicholes Stairs, MD;  Location: [facility];  Service: Orthopedics;  Laterality: Right;   RADIOLOGY WITH ANESTHESIA N/A 09/05/2021   Procedure: TIPS;  Surgeon: Suzette Battiest, MD;  Location: [place];  Service: Radiology;  Laterality: N/A;    Family Hx:  Family History  Problem Relation Age of Onset   Hepatitis Mother    Cirrhosis Mother    Stroke Father    Heart failure Father    Colon cancer Father    Kidney failure Brother    Liver cancer Brother    Stomach cancer Neg Hx    Esophageal cancer Neg Hx    Colon polyps Neg Hx    Crohn's disease Neg Hx    Rectal cancer Neg Hx    Ulcerative colitis Neg Hx     Social History:  reports that he has never smoked. He has never been exposed to tobacco smoke. He has never used smokeless tobacco. He reports that he does not currently use alcohol. He reports that he does not currently use drugs.  Allergies:  Allergies  Allergen Reactions   Latex Itching   Tape Itching   Amoxicillin Itching    Itching around eyes and scalp on amoxicilin   Cephalexin Nausea Only    Upset stomach Ceftriaxone is ok   Penicillins Rash    Medications: Prior to Admission medications   Medication Sig Start Date End Date Taking? Authorizing Provider  furosemide (LASIX) 20 MG  tablet TAKE 1 TABLET(20 MG) BY MOUTH TWICE DAILY 04/21/22   Daryel November, MD  Iron, Ferrous Sulfate, 325 (65 Fe) MG TABS Take 325 mg by mouth daily. 04/07/22   Daryel November, MD  lactulose (CHRONULAC) 10 GM/15ML solution Take 30 mLs (20 g total) by mouth 3 (three) times daily. 06/18/22   Daryel November, MD  lactulose, encephalopathy, (CHRONULAC) 10 GM/15ML SOLN Take 30 mLs (20 g total) by mouth 2 (two) times daily. Patient not taking: Reported on 04/01/2022 12/04/21   Daryel November, MD  meloxicam (MOBIC) 7.5 MG tablet Take 1 tablet (7.5 mg total) by mouth every 12 (twelve) hours as needed for pain. 04/30/22   Daryel November, MD  midodrine (PROAMATINE) 10 MG tablet Take 1 tablet (10 mg total) by mouth 3 (three) times daily with meals. 12/18/21   Daryel November, MD  oxyCODONE (OXY IR/ROXICODONE) 5 MG immediate release tablet Take 1 tablet (5 mg total) by mouth every 6 (six) hours as needed for moderate pain. Patient not taking: Reported on 03/16/2022 09/07/21   Allred, Darrell K, PA-C  pantoprazole (PROTONIX) 40 MG tablet TAKE 1 TABLET(40 MG) BY MOUTH TWICE DAILY 04/30/22   Daryel November, MD  spironolactone (ALDACTONE) 25 MG tablet Take 2 tablets (50 mg total) by mouth 2 (two) times daily. Patient taking differently: Take 25 mg by mouth 2 (two) times daily. 06/25/21   Daryel November, MD    I have reviewed the patient's current medications.  Labs:  Results for orders placed or performed during the hospital encounter of 07/13/2022 (from the past 48 hour(s))  CBG monitoring, ED     Status: Abnormal   Collection Time: 07/17/2022  1:41 PM  Result Value Ref Range   Glucose-Capillary <10 (LL) 70 - 99 mg/dL  Comment: Glucose reference range applies only to samples taken after fasting for at least 8 hours.  CBG monitoring, ED     Status: Abnormal   Collection Time: 06/27/2022  1:51 PM  Result Value Ref Range   Glucose-Capillary 422 (H) 70 - 99 mg/dL    Comment: Glucose  reference range applies only to samples taken after fasting for at least 8 hours.  CBC with Differential     Status: Abnormal   Collection Time: 06/28/2022  1:53 PM  Result Value Ref Range   WBC 20.2 (H) 4.0 - 10.5 K/uL   RBC 2.20 (L) 4.22 - 5.81 MIL/uL   Hemoglobin 7.9 (L) 13.0 - 17.0 g/dL   HCT 23.8 (L) 39.0 - 52.0 %   MCV 108.2 (H) 80.0 - 100.0 fL   MCH 35.9 (H) 26.0 - 34.0 pg   MCHC 33.2 30.0 - 36.0 g/dL   RDW 16.9 (H) 11.5 - 15.5 %   Platelets 40 (L) 150 - 400 K/uL    Comment: Immature Platelet Fraction may be clinically indicated, consider ordering this additional test [id number] REPEATED TO VERIFY PLATELET COUNT CONFIRMED BY SMEAR PLATELETS APPEAR DECREASED    nRBC 0.2 0.0 - 0.2 %   Neutrophils Relative % 59 %   Neutro Abs 18.6 (H) 1.7 - 7.7 K/uL   Band Neutrophils 33 %   Lymphocytes Relative 4 %   Lymphs Abs 0.8 0.7 - 4.0 K/uL   Monocytes Relative 1 %   Monocytes Absolute 0.2 0.1 - 1.0 K/uL   Eosinophils Relative 0 %   Eosinophils Absolute 0.0 0.0 - 0.5 K/uL   Basophils Relative 0 %   Basophils Absolute 0.0 0.0 - 0.1 K/uL   WBC Morphology INCREASED BANDS (>20% BANDS)     Comment: TOXIC GRANULATION VACUOLATED NEUTROPHILS    RBC Morphology MORPHOLOGY UNREMARKABLE    Metamyelocytes Relative 2 %   Myelocytes 1 %   Abs Immature Granulocytes 0.60 (H) 0.00 - 0.07 K/uL    Comment: Performed at [facility], [address], [place], [place] [address]  Protime-INR     Status: Abnormal   Collection Time: 07/17/2022  1:53 PM  Result Value Ref Range   Prothrombin Time 36.2 (H) 11.4 - 15.2 seconds   INR 3.7 (H) 0.8 - 1.2    Comment: (NOTE) INR goal varies based on device and disease states. Performed at [facility], [place] [address] [place], [place] [address]   Troponin I (High Sensitivity)     Status: Abnormal   Collection Time: 07/13/2022  1:53 PM  Result Value Ref Range   Troponin I (High Sensitivity) 3,201 (HH) <18 ng/L    Comment: CRITICAL RESULT CALLED TO,  READ BACK BY AND VERIFIED WITH J.NEWTON RN 1513 07/07/2022 MCCORMICK K (NOTE) Elevated high sensitivity troponin I (hsTnI) values and significant  changes across serial measurements may suggest ACS but many other  chronic and acute conditions are known to elevate hsTnI results.  Refer to the "Links" section for chest pain algorithms and additional  guidance. Performed at [facility], [place] [address], [place], [place] [address]   Lactic acid, plasma     Status: Abnormal   Collection Time: 07/04/2022  1:53 PM  Result Value Ref Range   Lactic Acid, Venous 5.7 (HH) 0.5 - 1.9 mmol/L    Comment: CRITICAL RESULT CALLED TO, READ BACK BY AND VERIFIED WITH J.NEWTON RN 1513 07/13/2022 MCCORMICK K Performed at [facility], [place]  [address] [place], [place] [address]   Ethanol     Status: None   Collection Time: 07/09/2022  1:53 PM  Result Value Ref Range   Alcohol, Ethyl (B) <10 <10 mg/dL    Comment: (NOTE) Lowest detectable limit for serum alcohol is 10 mg/dL.  For medical purposes only. Performed at [facility], [place] [address], [place], [place] [address]   Comprehensive metabolic panel     Status: Abnormal   Collection Time: 06/25/2022  1:53 PM  Result Value Ref Range   Sodium 122 (L) 135 - 145 mmol/L   Potassium 5.6 (H) 3.5 - 5.1 mmol/L   Chloride 89 (L) 98 - 111 mmol/L   CO2 17 (L) 22 - 32 mmol/L   Glucose, Bld 185 (H) 70 - 99 mg/dL    Comment: Glucose reference range applies only to samples taken after fasting for at least 8 hours.   BUN 61 (H) 6 - 20 mg/dL   Creatinine, Ser 5.42 (H) 0.61 - 1.24 mg/dL   Calcium 7.1 (L) 8.9 - 10.3 mg/dL   Total Protein 5.1 (L) 6.5 - 8.1 g/dL   Albumin 1.9 (L) 3.5 - 5.0 g/dL   AST 93 (H) 15 - 41 U/L   ALT 23 0 - 44 U/L   Alkaline Phosphatase 217 (H) 38 - 126 U/L   Total Bilirubin 7.9 (H) 0.3 - 1.2 mg/dL   GFR, Estimated 11 (L) >60 mL/min    Comment: (NOTE) Calculated using the CKD-EPI Creatinine Equation (2021)    Anion gap 16 (H) 5  - 15    Comment: Performed at [facility], [place] [address], [place], [place] [address]  Brain natriuretic peptide     Status: Abnormal   Collection Time: 06/24/2022  1:53 PM  Result Value Ref Range   B Natriuretic Peptide 956.1 (H) 0.0 - 100.0 pg/mL    Comment: Performed at [facility] [address] [place], [place] [address]  Ammonia     Status: Abnormal   Collection Time: 07/15/2022  1:53 PM  Result Value Ref Range   Ammonia 74 (H) 9 - 35 umol/L    Comment: Performed at [facility] [address], [place], [place] [address]  CK     Status: None   Collection Time: 06/28/2022  1:53 PM  Result Value Ref Range   Total CK 391 49 - 397 U/L    Comment: Performed at [facility], [place] [address], [place],  [address]  CBG monitoring, ED     Status: Abnormal   Collection Time: 07/13/2022  1:55 PM  Result Value Ref Range   Glucose-Capillary 178 (H) 70 - 99 mg/dL    Comment: Glucose reference range applies only to samples taken after fasting for at least 8 hours.  I-Stat venous blood gas, ED     Status: Abnormal   Collection Time: 07/01/2022  2:09 PM  Result Value Ref Range   pH, Ven 7.357 7.25 - 7.43   pCO2, Ven 31.6 (L) 44 - 60 mmHg   pO2, Ven 66 (H) 32 - 45 mmHg   Bicarbonate 17.7 (L) 20.0 - 28.0 mmol/L   TCO2 19 (L) 22 - 32 mmol/L   O2 Saturation 92 %   Acid-base deficit 7.0 (H) 0.0 - 2.0 mmol/L   Sodium 121 (L) 135 - 145 mmol/L   Potassium 5.8 (H) 3.5 - 5.1 mmol/L   Calcium, Ion 0.87 (LL) 1.15 - 1.40 mmol/L  HCT 25.0 (L) 39.0 - 52.0 %   Hemoglobin 8.5 (L) 13.0 - 17.0 g/dL   Sample type VENOUS    Comment NOTIFIED PHYSICIAN   I-stat chem 8, ED     Status: Abnormal   Collection Time: 06/20/2022  2:10 PM  Result Value Ref Range   Sodium 121 (L) 135 - 145 mmol/L   Potassium 5.8 (H) 3.5 - 5.1 mmol/L   Chloride 91 (L) 98 - 111 mmol/L   BUN 68 (H) 6 - 20 mg/dL   Creatinine, Ser 6.10 (H) 0.61 - 1.24 mg/dL   Glucose, Bld 163 (H) 70 - 99 mg/dL    Comment:  Glucose reference range applies only to samples taken after fasting for at least 8 hours.   Calcium, Ion 0.84 (LL) 1.15 - 1.40 mmol/L   TCO2 18 (L) 22 - 32 mmol/L   Hemoglobin 8.5 (L) 13.0 - 17.0 g/dL   HCT 25.0 (L) 39.0 - 52.0 %   Comment NOTIFIED PHYSICIAN   Culture, blood (routine x 2)     Status: None (Preliminary result)   Collection Time: 07/02/2022  2:22 PM   Specimen: BLOOD  Result Value Ref Range   Specimen Description BLOOD SITE NOT SPECIFIED    Special Requests      BOTTLES DRAWN AEROBIC AND ANAEROBIC Blood Culture results may not be optimal due to an inadequate volume of blood received in culture bottles   Culture  Setup Time      GRAM POSITIVE COCCI IN CLUSTERS IN BOTH AEROBIC AND ANAEROBIC BOTTLES CRITICAL RESULT CALLED TO, READ BACK BY AND VERIFIED WITH: PHARMD G ABBOTT 06/30/22 @ 0510 BY AB Performed at [facility], [place] [address] [place], [place] [address]    Culture GRAM POSITIVE COCCI    Report Status PENDING   Blood Culture ID Panel (Reflexed)     Status: Abnormal   Collection Time: 06/20/2022  2:22 PM  Result Value Ref Range   Enterococcus faecalis NOT DETECTED NOT DETECTED   Enterococcus Faecium NOT DETECTED NOT DETECTED   Listeria monocytogenes NOT DETECTED NOT DETECTED   Staphylococcus species DETECTED (A) NOT DETECTED    Comment: CRITICAL RESULT CALLED TO, READ BACK BY AND VERIFIED WITH: PHARMD G ABBOTT 06/30/22 @ 0510 BY AB    Staphylococcus aureus (BCID) DETECTED (A) NOT DETECTED    Comment: Methicillin (oxacillin)-resistant Staphylococcus aureus (MRSA). MRSA is predictably resistant to beta-lactam antibiotics (except ceftaroline). Preferred therapy is vancomycin unless clinically contraindicated. Patient requires contact precautions if  hospitalized. CRITICAL RESULT CALLED TO, READ BACK BY AND VERIFIED WITH: PHARMD G ABBOTT 06/30/22 @ 0510 BY AB    Staphylococcus epidermidis NOT DETECTED NOT DETECTED   Staphylococcus lugdunensis NOT DETECTED NOT  DETECTED   Streptococcus species NOT DETECTED NOT DETECTED   Streptococcus agalactiae NOT DETECTED NOT DETECTED   Streptococcus pneumoniae NOT DETECTED NOT DETECTED   Streptococcus pyogenes NOT DETECTED NOT DETECTED   A.calcoaceticus-baumannii NOT DETECTED NOT DETECTED   Bacteroides fragilis NOT DETECTED NOT DETECTED   Enterobacterales NOT DETECTED NOT DETECTED   Enterobacter cloacae complex NOT DETECTED NOT DETECTED   Escherichia coli NOT DETECTED NOT DETECTED   Klebsiella aerogenes NOT DETECTED NOT DETECTED   Klebsiella oxytoca NOT DETECTED NOT DETECTED   Klebsiella pneumoniae NOT DETECTED NOT DETECTED   Proteus species NOT DETECTED NOT DETECTED   Salmonella species NOT DETECTED NOT DETECTED   Serratia marcescens NOT DETECTED NOT DETECTED   Haemophilus influenzae NOT DETECTED NOT DETECTED   Neisseria meningitidis NOT DETECTED  NOT DETECTED   Pseudomonas aeruginosa NOT DETECTED NOT DETECTED   Stenotrophomonas maltophilia NOT DETECTED NOT DETECTED   Candida albicans NOT DETECTED NOT DETECTED   Candida auris NOT DETECTED NOT DETECTED   Candida glabrata NOT DETECTED NOT DETECTED   Candida krusei NOT DETECTED NOT DETECTED   Candida parapsilosis NOT DETECTED NOT DETECTED   Candida tropicalis NOT DETECTED NOT DETECTED   Cryptococcus neoformans/gattii NOT DETECTED NOT DETECTED   Meth resistant mecA/C and MREJ DETECTED (A) NOT DETECTED    Comment: CRITICAL RESULT CALLED TO, READ BACK BY AND VERIFIED WITH: PHARMD G ABBOTT 06/30/22 @ 0510 BY AB Performed at [facility], [place] [address] [place], [place] [address]   Resp panel by RT-PCR (RSV, Flu A&B, Covid) Anterior Nasal Swab     Status: None   Collection Time: 07/12/2022  2:27 PM   Specimen: Anterior Nasal Swab  Result Value Ref Range   SARS Coronavirus 2 by RT PCR NEGATIVE NEGATIVE   Influenza A by PCR NEGATIVE NEGATIVE   Influenza B by PCR NEGATIVE NEGATIVE    Comment: (NOTE) The Xpert Xpress SARS-CoV-2/FLU/RSV plus assay is  intended as an aid in the diagnosis of influenza from Nasopharyngeal swab specimens and should not be used as a sole basis for treatment. Nasal washings and aspirates are unacceptable for Xpert Xpress SARS-CoV-2/FLU/RSV testing.  Fact Sheet for Patients: EntrepreneurPulse.com.au  Fact Sheet for Healthcare Providers: IncredibleEmployment.be  This test is not yet approved or cleared by the [place] FDA and has been authorized for detection and/or diagnosis of SARS-CoV-2 by FDA under an Emergency Use Authorization (EUA). This EUA will remain in effect (meaning this test can be used) for the duration of the COVID-19 declaration under Section 564(b)(1) of the Act, 21 U.S.C. section 360bbb-3(b)(1), unless the authorization is terminated or revoked.     Resp Syncytial Virus by PCR NEGATIVE NEGATIVE    Comment: (NOTE) Fact Sheet for Patients: EntrepreneurPulse.com.au  Fact Sheet for Healthcare Providers: IncredibleEmployment.be  This test is not yet approved or cleared by the [place] FDA and has been authorized for detection and/or diagnosis of SARS-CoV-2 by FDA under an Emergency Use Authorization (EUA). This EUA will remain in effect (meaning this test can be used) for the duration of the COVID-19 declaration under Section 564(b)(1) of the Act, 21 U.S.C. section 360bbb-3(b)(1), unless the authorization is terminated or revoked.  Performed at [facility], [place] [address], [place], [place] [address]   CBG monitoring, ED     Status: None   Collection Time: 06/30/2022  2:47 PM  Result Value Ref Range   Glucose-Capillary 82 70 - 99 mg/dL    Comment: Glucose reference range applies only to samples taken after fasting for at least 8 hours.  Urinalysis, Routine w reflex microscopic -Urine, Clean Catch     Status: Abnormal   Collection Time: 06/30/2022  3:23 PM  Result Value Ref Range   Color, Urine  AMBER (A) YELLOW    Comment: BIOCHEMICALS MAY BE AFFECTED BY COLOR   APPearance CLOUDY (A) CLEAR   Specific Gravity, Urine 1.021 1.005 - 1.030   pH 5.0 5.0 - 8.0   Glucose, UA NEGATIVE NEGATIVE mg/dL   Hgb urine dipstick NEGATIVE NEGATIVE   Bilirubin Urine SMALL (A) NEGATIVE   Ketones, ur NEGATIVE NEGATIVE mg/dL   Protein, ur 100 (A) NEGATIVE mg/dL   Nitrite NEGATIVE NEGATIVE   Leukocytes,Ua TRACE (A) NEGATIVE   RBC / HPF 0-5 0 - 5 RBC/hpf  WBC, UA 11-20 0 - 5 WBC/hpf   Bacteria, UA FEW (A) NONE SEEN   Squamous Epithelial / HPF 0-5 0 - 5 /HPF   Hyaline Casts, UA PRESENT     Comment: Performed at [facility], [place] [address] [place], [place] [address]  Urine rapid drug screen (hosp performed)     Status: Abnormal   Collection Time: 07/05/2022  3:23 PM  Result Value Ref Range   Opiates POSITIVE (A) NONE DETECTED   Cocaine POSITIVE (A) NONE DETECTED   Benzodiazepines NONE DETECTED NONE DETECTED   Amphetamines NONE DETECTED NONE DETECTED   Tetrahydrocannabinol NONE DETECTED NONE DETECTED   Barbiturates NONE DETECTED NONE DETECTED    Comment: (NOTE) DRUG SCREEN FOR MEDICAL PURPOSES ONLY.  IF CONFIRMATION IS NEEDED FOR ANY PURPOSE, NOTIFY LAB WITHIN 5 DAYS.  LOWEST DETECTABLE LIMITS FOR URINE DRUG SCREEN Drug Class                     Cutoff (ng/mL) Amphetamine and metabolites    1000 Barbiturate and metabolites    200 Benzodiazepine                 200 Opiates and metabolites        300 Cocaine and metabolites        300 THC                            50 Performed at [facility], [place] [address], [place], [place] [address]   CBG monitoring, ED     Status: None   Collection Time: 07/04/2022  4:21 PM  Result Value Ref Range   Glucose-Capillary 83 70 - 99 mg/dL    Comment: Glucose reference range applies only to samples taken after fasting for at least 8 hours.  Culture, blood (routine x 2)     Status: None (Preliminary result)   Collection Time: 06/22/2022  4:57 PM    Specimen: BLOOD RIGHT HAND  Result Value Ref Range   Specimen Description BLOOD RIGHT HAND    Special Requests      BOTTLES DRAWN AEROBIC AND ANAEROBIC Blood Culture results may not be optimal due to an inadequate volume of blood received in culture bottles   Culture  Setup Time      GRAM POSITIVE COCCI IN CLUSTERS IN BOTH AEROBIC AND ANAEROBIC BOTTLES CRITICAL VALUE NOTED.  VALUE IS CONSISTENT WITH PREVIOUSLY REPORTED AND CALLED VALUE. Performed at [facility], [place] [address], [place], [place] [address]    Culture GRAM POSITIVE COCCI    Report Status PENDING   Lactic acid, plasma     Status: Abnormal   Collection Time: 06/30/2022  6:05 PM  Result Value Ref Range   Lactic Acid, Venous 7.0 (HH) 0.5 - 1.9 mmol/L    Comment: CRITICAL VALUE NOTED.  VALUE IS CONSISTENT WITH PREVIOUSLY REPORTED AND CALLED VALUE. Performed at [facility], [place] [address] [place], [place] [address]   Troponin I (High Sensitivity)     Status: Abnormal   Collection Time: 07/17/2022  6:05 PM  Result Value Ref Range   Troponin I (High Sensitivity) 2,597 (HH) <18 ng/L    Comment: CRITICAL VALUE NOTED.  VALUE IS CONSISTENT WITH PREVIOUSLY REPORTED AND CALLED VALUE. (NOTE) Elevated high sensitivity troponin I (hsTnI) values and significant  changes across serial measurements may suggest ACS but many other  chronic and acute conditions are  known to elevate hsTnI results.  Refer to the "Links" section for chest pain algorithms and additional  guidance. Performed at [facility], [place] [address] [place], [place] [address]   HIV Antibody (routine testing w rflx)     Status: None   Collection Time: 06/19/2022  6:05 PM  Result Value Ref Range   HIV Screen 4th Generation wRfx Non Reactive Non Reactive    Comment: Performed at [facility], [place] [address] [place], [place] [address]  Glucose, capillary     Status: Abnormal   Collection Time: 06/22/2022  6:05 PM  Result Value Ref Range    Glucose-Capillary 67 (L) 70 - 99 mg/dL    Comment: Glucose reference range applies only to samples taken after fasting for at least 8 hours.  I-STAT 7, (LYTES, BLD GAS, ICA, H+H)     Status: Abnormal   Collection Time: 07/10/2022  6:24 PM  Result Value Ref Range   pH, Arterial 7.334 (L) 7.35 - 7.45   pCO2 arterial 30.9 (L) 32 - 48 mmHg   pO2, Arterial 40 (LL) 83 - 108 mmHg   Bicarbonate 16.7 (L) 20.0 - 28.0 mmol/L   TCO2 18 (L) 22 - 32 mmol/L   O2 Saturation 76 %   Acid-base deficit 9.0 (H) 0.0 - 2.0 mmol/L   Sodium 125 (L) 135 - 145 mmol/L   Potassium 5.2 (H) 3.5 - 5.1 mmol/L   Calcium, Ion 0.92 (L) 1.15 - 1.40 mmol/L   HCT 24.0 (L) 39.0 - 52.0 %   Hemoglobin 8.2 (L) 13.0 - 17.0 g/dL   Patient temperature 35.5 C    Sample type ARTERIAL   Glucose, capillary     Status: None   Collection Time: 07/14/2022  7:10 PM  Result Value Ref Range   Glucose-Capillary 96 70 - 99 mg/dL    Comment: Glucose reference range applies only to samples taken after fasting for at least 8 hours.  Basic metabolic panel     Status: Abnormal   Collection Time: 07/08/2022  7:30 PM  Result Value Ref Range   Sodium 126 (L) 135 - 145 mmol/L   Potassium 5.3 (H) 3.5 - 5.1 mmol/L   Chloride 94 (L) 98 - 111 mmol/L   CO2 14 (L) 22 - 32 mmol/L   Glucose, Bld 95 70 - 99 mg/dL    Comment: Glucose reference range applies only to samples taken after fasting for at least 8 hours.   BUN 60 (H) 6 - 20 mg/dL   Creatinine, Ser 5.26 (H) 0.61 - 1.24 mg/dL   Calcium 7.0 (L) 8.9 - 10.3 mg/dL   GFR, Estimated 12 (L) >60 mL/min    Comment: (NOTE) Calculated using the CKD-EPI Creatinine Equation (2021)    Anion gap 18 (H) 5 - 15    Comment: Performed at [facility] [address], [place], [place] [address]  Lactic acid, plasma     Status: Abnormal   Collection Time: 06/25/2022  8:10 PM  Result Value Ref Range   Lactic Acid, Venous 6.6 (HH) 0.5 - 1.9 mmol/L    Comment: CRITICAL VALUE NOTED. VALUE IS CONSISTENT WITH  PREVIOUSLY REPORTED/CALLED VALUE Performed at [facility], [place] [address] [place], [place] [address]   Troponin I (High Sensitivity)     Status: Abnormal   Collection Time: 07/11/2022  8:10 PM  Result Value Ref Range   Troponin I (High Sensitivity) 3,345 (HH) <18 ng/L    Comment: CRITICAL  VALUE NOTED. VALUE IS CONSISTENT WITH PREVIOUSLY REPORTED/CALLED VALUE (NOTE) Elevated high sensitivity troponin I (hsTnI) values and significant  changes across serial measurements may suggest ACS but many other  chronic and acute conditions are known to elevate hsTnI results.  Refer to the "Links" section for chest pain algorithms and additional  guidance. Performed at [facility], [place] [address], [place], [place] [address]   Glucose, capillary     Status: None   Collection Time: 07/10/2022  9:38 PM  Result Value Ref Range   Glucose-Capillary 78 70 - 99 mg/dL    Comment: Glucose reference range applies only to samples taken after fasting for at least 8 hours.  Lactic acid, plasma     Status: Abnormal   Collection Time: 07/15/2022 10:20 PM  Result Value Ref Range   Lactic Acid, Venous 5.7 (HH) 0.5 - 1.9 mmol/L    Comment: CRITICAL VALUE NOTED. VALUE IS CONSISTENT WITH PREVIOUSLY REPORTED/CALLED VALUE Performed at [facility], [place] [address] [place], [place] [address]   Troponin I (High Sensitivity)     Status: Abnormal   Collection Time: 07/07/2022 10:20 PM  Result Value Ref Range   Troponin I (High Sensitivity) 3,256 (HH) <18 ng/L    Comment: CRITICAL VALUE NOTED. VALUE IS CONSISTENT WITH PREVIOUSLY REPORTED/CALLED VALUE (NOTE) Elevated high sensitivity troponin I (hsTnI) values and significant  changes across serial measurements may suggest ACS but many other  chronic and acute conditions are known to elevate hsTnI results.  Refer to the "Links" section for chest pain algorithms and additional  guidance. Performed at [facility], [place] [address], [place],  [place] [address]   Glucose, capillary     Status: None   Collection Time: 06/30/2022 11:29 PM  Result Value Ref Range   Glucose-Capillary 75 70 - 99 mg/dL    Comment: Glucose reference range applies only to samples taken after fasting for at least 8 hours.  Glucose, capillary     Status: None   Collection Time: 06/30/22  3:15 AM  Result Value Ref Range   Glucose-Capillary 72 70 - 99 mg/dL    Comment: Glucose reference range applies only to samples taken after fasting for at least 8 hours.  CBC     Status: Abnormal   Collection Time: 06/30/22  3:58 AM  Result Value Ref Range   WBC 36.8 (H) 4.0 - 10.5 K/uL   RBC 2.10 (L) 4.22 - 5.81 MIL/uL   Hemoglobin 7.6 (L) 13.0 - 17.0 g/dL   HCT 21.7 (L) 39.0 - 52.0 %   MCV 103.3 (H) 80.0 - 100.0 fL   MCH 36.2 (H) 26.0 - 34.0 pg   MCHC 35.0 30.0 - 36.0 g/dL   RDW 16.8 (H) 11.5 - 15.5 %   Platelets 32 (L) 150 - 400 K/uL    Comment: Immature Platelet Fraction may be clinically indicated, consider ordering this additional test [id number] REPEATED TO VERIFY PLATELET COUNT CONFIRMED BY SMEAR    nRBC 0.5 (H) 0.0 - 0.2 %    Comment: Performed at [facility], [place] [address], [place], [place] [id number]  Basic metabolic panel     Status: Abnormal   Collection Time: 06/30/22  3:58 AM  Result Value Ref Range   Sodium 125 (L) 135 - 145 mmol/L   Potassium 6.2 (H) 3.5 - 5.1 mmol/L   Chloride 97 (L) 98 - 111 mmol/L   CO2 17 (L) 22 - 32 mmol/L   Glucose, Bld  66 (L) 70 - 99 mg/dL    Comment: Glucose reference range applies only to samples taken after fasting for at least 8 hours.   BUN 65 (H) 6 - 20 mg/dL   Creatinine, Ser 5.68 (H) 0.61 - 1.24 mg/dL   Calcium 6.8 (L) 8.9 - 10.3 mg/dL   GFR, Estimated 11 (L) >60 mL/min    Comment: (NOTE) Calculated using the CKD-EPI Creatinine Equation (2021)    Anion gap 11 5 - 15    Comment: Performed at [facility] [address] [place], [place] [address]  Magnesium     Status: None   Collection Time:  06/30/22  3:58 AM  Result Value Ref Range   Magnesium 1.8 1.7 - 2.4 mg/dL    Comment: Performed at [facility] [address], [place], [place] [address]  Phosphorus     Status: Abnormal   Collection Time: 06/30/22  3:58 AM  Result Value Ref Range   Phosphorus 5.1 (H) 2.5 - 4.6 mg/dL    Comment: Performed at [facility] [address], [place], [place] [id number]  Basic metabolic panel     Status: Abnormal   Collection Time: 06/30/22  7:27 AM  Result Value Ref Range   Sodium 127 (L) 135 - 145 mmol/L   Potassium 6.2 (H) 3.5 - 5.1 mmol/L   Chloride 96 (L) 98 - 111 mmol/L   CO2 15 (L) 22 - 32 mmol/L   Glucose, Bld 63 (L) 70 - 99 mg/dL    Comment: Glucose reference range applies only to samples taken after fasting for at least 8 hours.   BUN 66 (H) 6 - 20 mg/dL   Creatinine, Ser 5.88 (H) 0.61 - 1.24 mg/dL   Calcium 6.9 (L) 8.9 - 10.3 mg/dL   GFR, Estimated 10 (L) >60 mL/min    Comment: (NOTE) Calculated using the CKD-EPI Creatinine Equation (2021)    Anion gap 16 (H) 5 - 15    Comment: Performed at [facility] [address], [place], [place] [address]  Hepatic function panel     Status: Abnormal   Collection Time: 06/30/22  7:27 AM  Result Value Ref Range   Total Protein 5.3 (L) 6.5 - 8.1 g/dL   Albumin 2.1 (L) 3.5 - 5.0 g/dL   AST 136 (H) 15 - 41 U/L   ALT 36 0 - 44 U/L   Alkaline Phosphatase 274 (H) 38 - 126 U/L   Total Bilirubin 10.0 (H) 0.3 - 1.2 mg/dL   Bilirubin, Direct 5.1 (H) 0.0 - 0.2 mg/dL   Indirect Bilirubin 4.9 (H) 0.3 - 0.9 mg/dL    Comment: Performed at [facility] [address], [place], [place] [address]  Prepare platelet pheresis     Status: None (Preliminary result)   Collection Time: 06/30/22  8:23 AM  Result Value Ref Range   Unit Number [id number]    Blood Component Type PLTP2 PSORALEN TREATED    Unit division 00    Status of Unit ALLOCATED    Transfusion Status OK TO TRANSFUSE   Prepare fresh frozen plasma      Status: None (Preliminary result)   Collection Time: 06/30/22  8:23 AM  Result Value Ref Range   Unit Number [id number]    Blood Component Type THAWED PLASMA    Unit division 00    Status of Unit ALLOCATED    Transfusion Status OK TO TRANSFUSE   Glucose, capillary  Status: Abnormal   Collection Time: 06/30/22  9:41 AM  Result Value Ref Range   Glucose-Capillary 61 (L) 70 - 99 mg/dL    Comment: Glucose reference range applies only to samples taken after fasting for at least 8 hours.  I-STAT 7, (LYTES, BLD GAS, ICA, H+H)     Status: Abnormal   Collection Time: 06/30/22  9:46 AM  Result Value Ref Range   pH, Arterial 7.284 (L) 7.35 - 7.45   pCO2 arterial 46.1 32 - 48 mmHg   pO2, Arterial 94 83 - 108 mmHg   Bicarbonate 21.9 20.0 - 28.0 mmol/L   TCO2 23 22 - 32 mmol/L   O2 Saturation 96 %   Acid-base deficit 5.0 (H) 0.0 - 2.0 mmol/L   Sodium 127 (L) 135 - 145 mmol/L   Potassium 5.5 (H) 3.5 - 5.1 mmol/L   Calcium, Ion 0.94 (L) 1.15 - 1.40 mmol/L   HCT 27.0 (L) 39.0 - 52.0 %   Hemoglobin 9.2 (L) 13.0 - 17.0 g/dL   Patient temperature 36.9 C    Collection site RADIAL, ALLEN'S TEST ACCEPTABLE    Drawn by RT    Sample type ARTERIAL      ROS:  Review of systems not obtained due to patient factors.  Physical Exam: Vitals:   06/30/22 0700 06/30/22 0830  BP: (!) 115/50   Pulse: 98   Resp: (!) 24   Temp: 99 F (37.2 C)   SpO2: 91% 100%     General: sedated , intubated HEENT: PERRLA, EOMI Neck:no JVD Heart: tachy Lungs:CBS bilat Abdomen: distended Extremities: mild pitting edema-  leg wound which is wrapped Skin: warm and dry Neuro: sedated on vent  Assessment/Plan: 62 year old WM with cirrhosis but well compensated-  found down-  drugs in system as well as bacteremia thought due to leg wound- sepsis and shock 1.Renal- baseline crt 0.9 in December of 23. Now with AKI in the setting of shock- likely ATN-  also pre admit mobic.  BP suppoorted with pressors and  bacteremia being treated.  However, renal function worsening and has developed pretty significant hyperkalemia with metabolic acidosis.  Decision is made to support with CRRT to start today -  will do 2 k bath, no heparin and no volume removal at first.  Appreciate CCM to place access 2. Hypertension/volume  - hypotensive req pressors-  mildly fluid overloaded-  no UF at first with CRRT 3. Hyperkalemia-  has been medically treated-  starting CRRT-  2 K bath-  can stop bicarb once CRRT is up and running 4. Anemia  - supportive care for now but I will also add ESA 5. Hypocalcemia-  s/p iv dosing -  corrected calcium is better  6. Acidosis- CRRT will help correct-  will not need iv bicarb once started    Louis Meckel 06/30/2022, 10:28 AM

## 2022-06-30 NOTE — Consult Note (Signed)
Reason for Consult:Right leg wound Referring Physician: [name] Time called: [id number] Time at bedside: 0935   Kyle Pitts is an 62 y.o. male.  HPI: Kyle Pitts was admitted yesterday after being found unresponsive by a family member. He was found to be in septic shock and admitted. He decompensated overnight and was intubated this morning. 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 As he is intubated he cannot contribute to history.  Past Medical History:  Diagnosis Date   Allergic reaction 11/21/2020   Anemia    DENIES   Asthma    "SLIGHT"   Blood transfusion without reported diagnosis    Cataract    LEFT EYE,RIGHT EYE REMOVED   Cirrhosis ([place])    Hyponatremia    Myofasciitis 11/21/2020   Osteomyelitis of right leg ([place]) 11/21/2020   Septic arthritis ([place])    Streptococcus infection, group A 11/21/2020    Past Surgical History:  Procedure Laterality Date   BIOPSY  02/15/2021   Procedure: BIOPSY;  Surgeon: Daryel November, MD;  Location: [facility] ENDOSCOPY;  Service: Gastroenterology;;   ESOPHAGOGASTRODUODENOSCOPY N/A 02/15/2021   Procedure: ESOPHAGOGASTRODUODENOSCOPY (EGD);  Surgeon: Daryel November, MD;  Location: [place];  Service: Gastroenterology;  Laterality: N/A;   ESOPHAGOGASTRODUODENOSCOPY (EGD) WITH PROPOFOL N/A 03/19/2021   Procedure: ESOPHAGOGASTRODUODENOSCOPY (EGD) WITH PROPOFOL;  Surgeon: Sharyn Creamer, MD;  Location: [place];  Service: Gastroenterology;  Laterality: N/A;   HEMOSTASIS CLIP PLACEMENT  03/19/2021   Procedure: HEMOSTASIS CLIP PLACEMENT;  Surgeon: Sharyn Creamer, MD;  Location: [facility] ENDOSCOPY;  Service: Gastroenterology;;   I & D EXTREMITY Right 10/09/2020   Procedure: IRRIGATION AND DEBRIDEMENT EXTREMITY;  Surgeon: Nicholes Stairs, MD;  Location: [facility];  Service: Orthopedics;  Laterality: Right;   IR IVUS EACH ADDITIONAL NON CORONARY VESSEL  09/05/2021   IR  PARACENTESIS  03/18/2021   IR PARACENTESIS  04/09/2021   IR PARACENTESIS  04/22/2021   IR PARACENTESIS  05/02/2021   IR PARACENTESIS  05/16/2021   IR PARACENTESIS  05/29/2021   IR PARACENTESIS  06/13/2021   IR PARACENTESIS  06/25/2021   IR PARACENTESIS  07/04/2021   IR PARACENTESIS  07/16/2021   IR PARACENTESIS  07/25/2021   IR PARACENTESIS  07/31/2021   IR PARACENTESIS  08/06/2021   IR PARACENTESIS  08/14/2021   IR PARACENTESIS  08/21/2021   IR PARACENTESIS  08/29/2021   IR PARACENTESIS  09/05/2021   IR PARACENTESIS  09/24/2021   IR PARACENTESIS  10/07/2021   IR PARACENTESIS  10/23/2021   IR RADIOLOGIST EVAL & MGMT  08/11/2021   IR RADIOLOGIST EVAL & MGMT  10/14/2021   IR RADIOLOGIST EVAL & MGMT  12/15/2021   IR RADIOLOGIST EVAL & MGMT  03/16/2022   IR TIPS  09/05/2021   IR US GUIDE VASC ACCESS RIGHT  09/05/2021   KNEE ARTHROSCOPY Right 09/27/2020   Procedure: ARTHROSCOPY KNEE, Alpha OUT;  Surgeon: Melina Schools, MD;  [facility];  Service: Orthopedics;  Laterality: Right;   KNEE ARTHROSCOPY Right 10/09/2020   Procedure: ARTHROSCOPY KNEE,ARTHROSCOPIC I & D, CALF ABSCESS;  Surgeon: Nicholes Stairs, MD;  [facility];  Service: Orthopedics;  Laterality: Right;   RADIOLOGY WITH ANESTHESIA N/A 09/05/2021   Procedure: TIPS;  Surgeon: Suzette Battiest, MD;  Location: [place];  Service: Radiology;  Laterality: N/A;    Family History  Problem Relation Age of Onset   Hepatitis Mother    Cirrhosis Mother  Stroke Father    Heart failure Father    Colon cancer Father    Kidney failure Brother    Liver cancer Brother    Stomach cancer Neg Hx    Esophageal cancer Neg Hx    Colon polyps Neg Hx    Crohn's disease Neg Hx    Rectal cancer Neg Hx    Ulcerative colitis Neg Hx     Social History:  reports that he has never smoked. He has never been exposed to tobacco smoke. He has never used smokeless tobacco. He reports that he does not currently use alcohol. He reports that he does not currently use  drugs.  Allergies:  Allergies  Allergen Reactions   Latex Itching   Tape Itching   Amoxicillin Itching    Itching around eyes and scalp on amoxicilin   Cephalexin Nausea Only    Upset stomach Ceftriaxone is ok   Penicillins Rash    Medications: I have reviewed the patient's current medications.  Results for orders placed or performed during the hospital encounter of 07/12/2022 (from the past 48 hour(s))  CBG monitoring, ED     Status: Abnormal   Collection Time: 07/04/2022  1:41 PM  Result Value Ref Range   Glucose-Capillary <10 (LL) 70 - 99 mg/dL    Comment: Glucose reference range applies only to samples taken after fasting for at least 8 hours.  CBG monitoring, ED     Status: Abnormal   Collection Time: 07/09/2022  1:51 PM  Result Value Ref Range   Glucose-Capillary 422 (H) 70 - 99 mg/dL    Comment: Glucose reference range applies only to samples taken after fasting for at least 8 hours.  CBC with Differential     Status: Abnormal   Collection Time: 07/06/2022  1:53 PM  Result Value Ref Range   WBC 20.2 (H) 4.0 - 10.5 K/uL   RBC 2.20 (L) 4.22 - 5.81 MIL/uL   Hemoglobin 7.9 (L) 13.0 - 17.0 g/dL   HCT 23.8 (L) 39.0 - 52.0 %   MCV 108.2 (H) 80.0 - 100.0 fL   MCH 35.9 (H) 26.0 - 34.0 pg   MCHC 33.2 30.0 - 36.0 g/dL   RDW 16.9 (H) 11.5 - 15.5 %   Platelets 40 (L) 150 - 400 K/uL    Comment: Immature Platelet Fraction may be clinically indicated, consider ordering this additional test [id number] REPEATED TO VERIFY PLATELET COUNT CONFIRMED BY SMEAR PLATELETS APPEAR DECREASED    nRBC 0.2 0.0 - 0.2 %   Neutrophils Relative % 59 %   Neutro Abs 18.6 (H) 1.7 - 7.7 K/uL   Band Neutrophils 33 %   Lymphocytes Relative 4 %   Lymphs Abs 0.8 0.7 - 4.0 K/uL   Monocytes Relative 1 %   Monocytes Absolute 0.2 0.1 - 1.0 K/uL   Eosinophils Relative 0 %   Eosinophils Absolute 0.0 0.0 - 0.5 K/uL   Basophils Relative 0 %   Basophils Absolute 0.0 0.0 - 0.1 K/uL   WBC Morphology INCREASED  BANDS (>20% BANDS)     Comment: TOXIC GRANULATION VACUOLATED NEUTROPHILS    RBC Morphology MORPHOLOGY UNREMARKABLE    Metamyelocytes Relative 2 %   Myelocytes 1 %   Abs Immature Granulocytes 0.60 (H) 0.00 - 0.07 K/uL    Comment: Performed at [facility], [address], [place], [place] [address]  Protime-INR     Status: Abnormal   Collection Time: 07/17/2022  1:53 PM  Result Value Ref Range  Prothrombin Time 36.2 (H) 11.4 - 15.2 seconds   INR 3.7 (H) 0.8 - 1.2    Comment: (NOTE) INR goal varies based on device and disease states. Performed at [facility], [place] [address] [place], [place] [address]   Troponin I (High Sensitivity)     Status: Abnormal   Collection Time: 07/19/2022  1:53 PM  Result Value Ref Range   Troponin I (High Sensitivity) 3,201 (HH) <18 ng/L    Comment: CRITICAL RESULT CALLED TO, READ BACK BY AND VERIFIED WITH J.NEWTON RN 1513 07/05/2022 MCCORMICK K (NOTE) Elevated high sensitivity troponin I (hsTnI) values and significant  changes across serial measurements may suggest ACS but many other  chronic and acute conditions are known to elevate hsTnI results.  Refer to the "Links" section for chest pain algorithms and additional  guidance. Performed at [facility], [place] [address], [place], [place] [address]   Lactic acid, plasma     Status: Abnormal   Collection Time: 07/13/2022  1:53 PM  Result Value Ref Range   Lactic Acid, Venous 5.7 (HH) 0.5 - 1.9 mmol/L    Comment: CRITICAL RESULT CALLED TO, READ BACK BY AND VERIFIED WITH J.NEWTON RN 1513 07/12/2022 MCCORMICK K Performed at [facility], [place] [address], [place], [place] [address]   Ethanol     Status: None   Collection Time: 07/16/2022  1:53 PM  Result Value Ref Range   Alcohol, Ethyl (B) <10 <10 mg/dL    Comment: (NOTE) Lowest detectable limit for serum alcohol is 10 mg/dL.  For medical purposes only. Performed at [facility], [place] [address], [place], [place] [address]    Comprehensive metabolic panel     Status: Abnormal   Collection Time: 06/30/2022  1:53 PM  Result Value Ref Range   Sodium 122 (L) 135 - 145 mmol/L   Potassium 5.6 (H) 3.5 - 5.1 mmol/L   Chloride 89 (L) 98 - 111 mmol/L   CO2 17 (L) 22 - 32 mmol/L   Glucose, Bld 185 (H) 70 - 99 mg/dL    Comment: Glucose reference range applies only to samples taken after fasting for at least 8 hours.   BUN 61 (H) 6 - 20 mg/dL   Creatinine, Ser 5.42 (H) 0.61 - 1.24 mg/dL   Calcium 7.1 (L) 8.9 - 10.3 mg/dL   Total Protein 5.1 (L) 6.5 - 8.1 g/dL   Albumin 1.9 (L) 3.5 - 5.0 g/dL   AST 93 (H) 15 - 41 U/L   ALT 23 0 - 44 U/L   Alkaline Phosphatase 217 (H) 38 - 126 U/L   Total Bilirubin 7.9 (H) 0.3 - 1.2 mg/dL   GFR, Estimated 11 (L) >60 mL/min    Comment: (NOTE) Calculated using the CKD-EPI Creatinine Equation (2021)    Anion gap 16 (H) 5 - 15    Comment: Performed at [facility], [place] [address] [place], [place] [address]  Brain natriuretic peptide     Status: Abnormal   Collection Time: 06/30/2022  1:53 PM  Result Value Ref Range   B Natriuretic Peptide 956.1 (H) 0.0 - 100.0 pg/mL    Comment: Performed at [place] [address], [place],  [address]  Ammonia     Status: Abnormal   Collection Time: 07/12/2022  1:53 PM  Result Value Ref Range   Ammonia 74 (H) 9 - 35 umol/L    Comment: Performed at [facility], [address] [place], [place] [address]  CK     Status: None   Collection Time: 07/07/2022  1:53 PM  Result Value Ref Range   Total CK 391 49 - 397 U/L    Comment: Performed at [facility], [place] [address], [place], [place] [address]  CBG monitoring, ED     Status: Abnormal   Collection Time: 07/11/2022  1:55 PM  Result Value Ref Range   Glucose-Capillary 178 (H) 70 - 99 mg/dL    Comment: Glucose reference range applies only to samples taken after fasting for at least 8 hours.  I-Stat venous blood gas, ED     Status: Abnormal   Collection Time: 07/01/2022   2:09 PM  Result Value Ref Range   pH, Ven 7.357 7.25 - 7.43   pCO2, Ven 31.6 (L) 44 - 60 mmHg   pO2, Ven 66 (H) 32 - 45 mmHg   Bicarbonate 17.7 (L) 20.0 - 28.0 mmol/L   TCO2 19 (L) 22 - 32 mmol/L   O2 Saturation 92 %   Acid-base deficit 7.0 (H) 0.0 - 2.0 mmol/L   Sodium 121 (L) 135 - 145 mmol/L   Potassium 5.8 (H) 3.5 - 5.1 mmol/L   Calcium, Ion 0.87 (LL) 1.15 - 1.40 mmol/L   HCT 25.0 (L) 39.0 - 52.0 %   Hemoglobin 8.5 (L) 13.0 - 17.0 g/dL   Sample type VENOUS    Comment NOTIFIED PHYSICIAN   I-stat chem 8, ED     Status: Abnormal   Collection Time: 06/21/2022  2:10 PM  Result Value Ref Range   Sodium 121 (L) 135 - 145 mmol/L   Potassium 5.8 (H) 3.5 - 5.1 mmol/L   Chloride 91 (L) 98 - 111 mmol/L   BUN 68 (H) 6 - 20 mg/dL   Creatinine, Ser 6.10 (H) 0.61 - 1.24 mg/dL   Glucose, Bld 163 (H) 70 - 99 mg/dL    Comment: Glucose reference range applies only to samples taken after fasting for at least 8 hours.   Calcium, Ion 0.84 (LL) 1.15 - 1.40 mmol/L   TCO2 18 (L) 22 - 32 mmol/L   Hemoglobin 8.5 (L) 13.0 - 17.0 g/dL   HCT 25.0 (L) 39.0 - 52.0 %   Comment NOTIFIED PHYSICIAN   Culture, blood (routine x 2)     Status: None (Preliminary result)   Collection Time: 06/25/2022  2:22 PM   Specimen: BLOOD  Result Value Ref Range   Specimen Description BLOOD SITE NOT SPECIFIED    Special Requests      BOTTLES DRAWN AEROBIC AND ANAEROBIC Blood Culture results may not be optimal due to an inadequate volume of blood received in culture bottles   Culture  Setup Time      GRAM POSITIVE COCCI IN CLUSTERS IN BOTH AEROBIC AND ANAEROBIC BOTTLES CRITICAL RESULT CALLED TO, READ BACK BY AND VERIFIED WITH: PHARMD G ABBOTT 06/30/22 @ 0510 BY AB Performed at [facility], [place] [address], [place], [place] [address]    Culture GRAM POSITIVE COCCI    Report Status PENDING   Blood Culture ID Panel (Reflexed)     Status: Abnormal   Collection Time: 06/20/2022  2:22 PM  Result Value Ref Range    Enterococcus faecalis NOT DETECTED NOT DETECTED   Enterococcus Faecium NOT DETECTED NOT DETECTED   Listeria monocytogenes NOT DETECTED NOT DETECTED   Staphylococcus species DETECTED (A) NOT DETECTED    Comment: CRITICAL RESULT CALLED TO, READ BACK BY  AND VERIFIED WITH: PHARMD G ABBOTT 06/30/22 @ 0510 BY AB    Staphylococcus aureus (BCID) DETECTED (A) NOT DETECTED    Comment: Methicillin (oxacillin)-resistant Staphylococcus aureus (MRSA). MRSA is predictably resistant to beta-lactam antibiotics (except ceftaroline). Preferred therapy is vancomycin unless clinically contraindicated. Patient requires contact precautions if  hospitalized. CRITICAL RESULT CALLED TO, READ BACK BY AND VERIFIED WITH: PHARMD G ABBOTT 06/30/22 @ 0510 BY AB    Staphylococcus epidermidis NOT DETECTED NOT DETECTED   Staphylococcus lugdunensis NOT DETECTED NOT DETECTED   Streptococcus species NOT DETECTED NOT DETECTED   Streptococcus agalactiae NOT DETECTED NOT DETECTED   Streptococcus pneumoniae NOT DETECTED NOT DETECTED   Streptococcus pyogenes NOT DETECTED NOT DETECTED   A.calcoaceticus-baumannii NOT DETECTED NOT DETECTED   Bacteroides fragilis NOT DETECTED NOT DETECTED   Enterobacterales NOT DETECTED NOT DETECTED   Enterobacter cloacae complex NOT DETECTED NOT DETECTED   Escherichia coli NOT DETECTED NOT DETECTED   Klebsiella aerogenes NOT DETECTED NOT DETECTED   Klebsiella oxytoca NOT DETECTED NOT DETECTED   Klebsiella pneumoniae NOT DETECTED NOT DETECTED   Proteus species NOT DETECTED NOT DETECTED   Salmonella species NOT DETECTED NOT DETECTED   Serratia marcescens NOT DETECTED NOT DETECTED   Haemophilus influenzae NOT DETECTED NOT DETECTED   Neisseria meningitidis NOT DETECTED NOT DETECTED   Pseudomonas aeruginosa NOT DETECTED NOT DETECTED   Stenotrophomonas maltophilia NOT DETECTED NOT DETECTED   Candida albicans NOT DETECTED NOT DETECTED   Candida auris NOT DETECTED NOT DETECTED   Candida glabrata NOT  DETECTED NOT DETECTED   Candida krusei NOT DETECTED NOT DETECTED   Candida parapsilosis NOT DETECTED NOT DETECTED   Candida tropicalis NOT DETECTED NOT DETECTED   Cryptococcus neoformans/gattii NOT DETECTED NOT DETECTED   Meth resistant mecA/C and MREJ DETECTED (A) NOT DETECTED    Comment: CRITICAL RESULT CALLED TO, READ BACK BY AND VERIFIED WITH: PHARMD G ABBOTT 06/30/22 @ 0510 BY AB Performed at [facility], [address], [place], [place] [address]   Resp panel by RT-PCR (RSV, Flu A&B, Covid) Anterior Nasal Swab     Status: None   Collection Time: 07/06/2022  2:27 PM   Specimen: Anterior Nasal Swab  Result Value Ref Range   SARS Coronavirus 2 by RT PCR NEGATIVE NEGATIVE   Influenza A by PCR NEGATIVE NEGATIVE   Influenza B by PCR NEGATIVE NEGATIVE    Comment: (NOTE) The Xpert Xpress SARS-CoV-2/FLU/RSV plus assay is intended as an aid in the diagnosis of influenza from Nasopharyngeal swab specimens and should not be used as a sole basis for treatment. Nasal washings and aspirates are unacceptable for Xpert Xpress SARS-CoV-2/FLU/RSV testing.  Fact Sheet for Patients: EntrepreneurPulse.com.au  Fact Sheet for Healthcare Providers: IncredibleEmployment.be  This test is not yet approved or cleared by the [place] FDA and has been authorized for detection and/or diagnosis of SARS-CoV-2 by FDA under an Emergency Use Authorization (EUA). This EUA will remain in effect (meaning this test can be used) for the duration of the COVID-19 declaration under Section 564(b)(1) of the Act, 21 U.S.C. section 360bbb-3(b)(1), unless the authorization is terminated or revoked.     Resp Syncytial Virus by PCR NEGATIVE NEGATIVE    Comment: (NOTE) Fact Sheet for Patients: EntrepreneurPulse.com.au  Fact Sheet for Healthcare Providers: IncredibleEmployment.be  This test is not yet approved or cleared by the [place] FDA and has been authorized for detection and/or diagnosis of SARS-CoV-2 by FDA under an Emergency Use Authorization (EUA). This EUA will remain in  effect (meaning this test can be used) for the duration of the COVID-19 declaration under Section 564(b)(1) of the Act, 21 U.S.C. section 360bbb-3(b)(1), unless the authorization is terminated or revoked.  Performed at [facility], [place] [address], [place], [place] [address]   CBG monitoring, ED     Status: None   Collection Time: 06/24/2022  2:47 PM  Result Value Ref Range   Glucose-Capillary 82 70 - 99 mg/dL    Comment: Glucose reference range applies only to samples taken after fasting for at least 8 hours.  Urinalysis, Routine w reflex microscopic -Urine, Clean Catch     Status: Abnormal   Collection Time: 07/17/2022  3:23 PM  Result Value Ref Range   Color, Urine AMBER (A) YELLOW    Comment: BIOCHEMICALS MAY BE AFFECTED BY COLOR   APPearance CLOUDY (A) CLEAR   Specific Gravity, Urine 1.021 1.005 - 1.030   pH 5.0 5.0 - 8.0   Glucose, UA NEGATIVE NEGATIVE mg/dL   Hgb urine dipstick NEGATIVE NEGATIVE   Bilirubin Urine SMALL (A) NEGATIVE   Ketones, ur NEGATIVE NEGATIVE mg/dL   Protein, ur 100 (A) NEGATIVE mg/dL   Nitrite NEGATIVE NEGATIVE   Leukocytes,Ua TRACE (A) NEGATIVE   RBC / HPF 0-5 0 - 5 RBC/hpf   WBC, UA 11-20 0 - 5 WBC/hpf   Bacteria, UA FEW (A) NONE SEEN   Squamous Epithelial / HPF 0-5 0 - 5 /HPF   Hyaline Casts, UA PRESENT     Comment: Performed at [facility], [address] [place], [place] [address]  Urine rapid drug screen (hosp performed)     Status: Abnormal   Collection Time: 06/25/2022  3:23 PM  Result Value Ref Range   Opiates POSITIVE (A) NONE DETECTED   Cocaine POSITIVE (A) NONE DETECTED   Benzodiazepines NONE DETECTED NONE DETECTED   Amphetamines NONE DETECTED NONE DETECTED   Tetrahydrocannabinol NONE DETECTED NONE DETECTED   Barbiturates NONE DETECTED NONE DETECTED    Comment:  (NOTE) DRUG SCREEN FOR MEDICAL PURPOSES ONLY.  IF CONFIRMATION IS NEEDED FOR ANY PURPOSE, NOTIFY LAB WITHIN 5 DAYS.  LOWEST DETECTABLE LIMITS FOR URINE DRUG SCREEN Drug Class                     Cutoff (ng/mL) Amphetamine and metabolites    1000 Barbiturate and metabolites    200 Benzodiazepine                 200 Opiates and metabolites        300 Cocaine and metabolites        300 THC                            50 Performed at [facility], [place] [address] [place], [place] [address]   CBG monitoring, ED     Status: None   Collection Time: 07/11/2022  4:21 PM  Result Value Ref Range   Glucose-Capillary 83 70 - 99 mg/dL    Comment: Glucose reference range applies only to samples taken after fasting for at least 8 hours.  Culture, blood (routine x 2)     Status: None (Preliminary result)   Collection Time: 07/16/2022  4:57 PM   Specimen: BLOOD RIGHT HAND  Result Value Ref Range   Specimen Description BLOOD RIGHT HAND    Special Requests      BOTTLES DRAWN AEROBIC AND ANAEROBIC Blood  Culture results may not be optimal due to an inadequate volume of blood received in culture bottles   Culture  Setup Time      GRAM POSITIVE COCCI IN CLUSTERS IN BOTH AEROBIC AND ANAEROBIC BOTTLES CRITICAL VALUE NOTED.  VALUE IS CONSISTENT WITH PREVIOUSLY REPORTED AND CALLED VALUE. Performed at [facility], [place] [address], [place], [place] [address]    Culture GRAM POSITIVE COCCI    Report Status PENDING   Lactic acid, plasma     Status: Abnormal   Collection Time: 07/09/2022  6:05 PM  Result Value Ref Range   Lactic Acid, Venous 7.0 (HH) 0.5 - 1.9 mmol/L    Comment: CRITICAL VALUE NOTED.  VALUE IS CONSISTENT WITH PREVIOUSLY REPORTED AND CALLED VALUE. Performed at [facility], [place] [address], [place], [place] [address]   Troponin I (High Sensitivity)     Status: Abnormal   Collection Time: 07/15/2022  6:05 PM  Result Value Ref Range   Troponin I (High Sensitivity) 2,597 (HH) <18  ng/L    Comment: CRITICAL VALUE NOTED.  VALUE IS CONSISTENT WITH PREVIOUSLY REPORTED AND CALLED VALUE. (NOTE) Elevated high sensitivity troponin I (hsTnI) values and significant  changes across serial measurements may suggest ACS but many other  chronic and acute conditions are known to elevate hsTnI results.  Refer to the "Links" section for chest pain algorithms and additional  guidance. Performed at [facility], [place] [address] [place], [place] [address]   HIV Antibody (routine testing w rflx)     Status: None   Collection Time: 06/23/2022  6:05 PM  Result Value Ref Range   HIV Screen 4th Generation wRfx Non Reactive Non Reactive    Comment: Performed at [facility], [place] [address], [place], [place] [address]  Glucose, capillary     Status: Abnormal   Collection Time: 06/22/2022  6:05 PM  Result Value Ref Range   Glucose-Capillary 67 (L) 70 - 99 mg/dL    Comment: Glucose reference range applies only to samples taken after fasting for at least 8 hours.  I-STAT 7, (LYTES, BLD GAS, ICA, H+H)     Status: Abnormal   Collection Time: 07/11/2022  6:24 PM  Result Value Ref Range   pH, Arterial 7.334 (L) 7.35 - 7.45   pCO2 arterial 30.9 (L) 32 - 48 mmHg   pO2, Arterial 40 (LL) 83 - 108 mmHg   Bicarbonate 16.7 (L) 20.0 - 28.0 mmol/L   TCO2 18 (L) 22 - 32 mmol/L   O2 Saturation 76 %   Acid-base deficit 9.0 (H) 0.0 - 2.0 mmol/L   Sodium 125 (L) 135 - 145 mmol/L   Potassium 5.2 (H) 3.5 - 5.1 mmol/L   Calcium, Ion 0.92 (L) 1.15 - 1.40 mmol/L   HCT 24.0 (L) 39.0 - 52.0 %   Hemoglobin 8.2 (L) 13.0 - 17.0 g/dL   Patient temperature 35.5 C    Sample type ARTERIAL   Glucose, capillary     Status: None   Collection Time: 07/05/2022  7:10 PM  Result Value Ref Range   Glucose-Capillary 96 70 - 99 mg/dL    Comment: Glucose reference range applies only to samples taken after fasting for at least 8 hours.  Basic metabolic panel     Status: Abnormal   Collection Time: 07/14/2022  7:30 PM   Result Value Ref Range   Sodium 126 (L) 135 - 145 mmol/L   Potassium 5.3 (H) 3.5 - 5.1 mmol/L  Chloride 94 (L) 98 - 111 mmol/L   CO2 14 (L) 22 - 32 mmol/L   Glucose, Bld 95 70 - 99 mg/dL    Comment: Glucose reference range applies only to samples taken after fasting for at least 8 hours.   BUN 60 (H) 6 - 20 mg/dL   Creatinine, Ser 5.26 (H) 0.61 - 1.24 mg/dL   Calcium 7.0 (L) 8.9 - 10.3 mg/dL   GFR, Estimated 12 (L) >60 mL/min    Comment: (NOTE) Calculated using the CKD-EPI Creatinine Equation (2021)    Anion gap 18 (H) 5 - 15    Comment: Performed at [place] [address] [place], [place] [address]  Lactic acid, plasma     Status: Abnormal   Collection Time: 06/24/2022  8:10 PM  Result Value Ref Range   Lactic Acid, Venous 6.6 (HH) 0.5 - 1.9 mmol/L    Comment: CRITICAL VALUE NOTED. VALUE IS CONSISTENT WITH PREVIOUSLY REPORTED/CALLED VALUE Performed at [facility], [place] [address], [place], [place] [address]   Troponin I (High Sensitivity)     Status: Abnormal   Collection Time: 07/09/2022  8:10 PM  Result Value Ref Range   Troponin I (High Sensitivity) 3,345 (HH) <18 ng/L    Comment: CRITICAL VALUE NOTED. VALUE IS CONSISTENT WITH PREVIOUSLY REPORTED/CALLED VALUE (NOTE) Elevated high sensitivity troponin I (hsTnI) values and significant  changes across serial measurements may suggest ACS but many other  chronic and acute conditions are known to elevate hsTnI results.  Refer to the "Links" section for chest pain algorithms and additional  guidance. Performed at [facility], [place] [address], [place], [place] [address]   Glucose, capillary     Status: None   Collection Time: 06/21/2022  9:38 PM  Result Value Ref Range   Glucose-Capillary 78 70 - 99 mg/dL    Comment: Glucose reference range applies only to samples taken after fasting for at least 8 hours.  Lactic acid, plasma     Status: Abnormal   Collection Time: 07/06/2022 10:20 PM  Result Value Ref  Range   Lactic Acid, Venous 5.7 (HH) 0.5 - 1.9 mmol/L    Comment: CRITICAL VALUE NOTED. VALUE IS CONSISTENT WITH PREVIOUSLY REPORTED/CALLED VALUE Performed at [facility], [place] [address], [place], [place] [address]   Troponin I (High Sensitivity)     Status: Abnormal   Collection Time: 06/28/2022 10:20 PM  Result Value Ref Range   Troponin I (High Sensitivity) 3,256 (HH) <18 ng/L    Comment: CRITICAL VALUE NOTED. VALUE IS CONSISTENT WITH PREVIOUSLY REPORTED/CALLED VALUE (NOTE) Elevated high sensitivity troponin I (hsTnI) values and significant  changes across serial measurements may suggest ACS but many other  chronic and acute conditions are known to elevate hsTnI results.  Refer to the "Links" section for chest pain algorithms and additional  guidance. Performed at [facility], [place] [address] [place], [place] [address]   Glucose, capillary     Status: None   Collection Time: 07/14/2022 11:29 PM  Result Value Ref Range   Glucose-Capillary 75 70 - 99 mg/dL    Comment: Glucose reference range applies only to samples taken after fasting for at least 8 hours.  Glucose, capillary     Status: None   Collection Time: 06/30/22  3:15 AM  Result Value Ref Range   Glucose-Capillary 72 70 - 99 mg/dL    Comment: Glucose reference range applies only to samples taken after fasting for  at least 8 hours.  CBC     Status: Abnormal   Collection Time: 06/30/22  3:58 AM  Result Value Ref Range   WBC 36.8 (H) 4.0 - 10.5 K/uL   RBC 2.10 (L) 4.22 - 5.81 MIL/uL   Hemoglobin 7.6 (L) 13.0 - 17.0 g/dL   HCT 21.7 (L) 39.0 - 52.0 %   MCV 103.3 (H) 80.0 - 100.0 fL   MCH 36.2 (H) 26.0 - 34.0 pg   MCHC 35.0 30.0 - 36.0 g/dL   RDW 16.8 (H) 11.5 - 15.5 %   Platelets 32 (L) 150 - 400 K/uL    Comment: Immature Platelet Fraction may be clinically indicated, consider ordering this additional test [id number] REPEATED TO VERIFY PLATELET COUNT CONFIRMED BY SMEAR    nRBC 0.5 (H) 0.0 - 0.2 %     Comment: Performed at [facility], [place] [address], [place], [place] [id number]  Basic metabolic panel     Status: Abnormal   Collection Time: 06/30/22  3:58 AM  Result Value Ref Range   Sodium 125 (L) 135 - 145 mmol/L   Potassium 6.2 (H) 3.5 - 5.1 mmol/L   Chloride 97 (L) 98 - 111 mmol/L   CO2 17 (L) 22 - 32 mmol/L   Glucose, Bld 66 (L) 70 - 99 mg/dL    Comment: Glucose reference range applies only to samples taken after fasting for at least 8 hours.   BUN 65 (H) 6 - 20 mg/dL   Creatinine, Ser 5.68 (H) 0.61 - 1.24 mg/dL   Calcium 6.8 (L) 8.9 - 10.3 mg/dL   GFR, Estimated 11 (L) >60 mL/min    Comment: (NOTE) Calculated using the CKD-EPI Creatinine Equation (2021)    Anion gap 11 5 - 15    Comment: Performed at [facility] [address], [place], [place] [address]  Magnesium     Status: None   Collection Time: 06/30/22  3:58 AM  Result Value Ref Range   Magnesium 1.8 1.7 - 2.4 mg/dL    Comment: Performed at [facility] [address], [place], [place] [address]  Phosphorus     Status: Abnormal   Collection Time: 06/30/22  3:58 AM  Result Value Ref Range   Phosphorus 5.1 (H) 2.5 - 4.6 mg/dL    Comment: Performed at [facility] [address], [place], [place] [id number]  Basic metabolic panel     Status: Abnormal   Collection Time: 06/30/22  7:27 AM  Result Value Ref Range   Sodium 127 (L) 135 - 145 mmol/L   Potassium 6.2 (H) 3.5 - 5.1 mmol/L   Chloride 96 (L) 98 - 111 mmol/L   CO2 15 (L) 22 - 32 mmol/L   Glucose, Bld 63 (L) 70 - 99 mg/dL    Comment: Glucose reference range applies only to samples taken after fasting for at least 8 hours.   BUN 66 (H) 6 - 20 mg/dL   Creatinine, Ser 5.88 (H) 0.61 - 1.24 mg/dL   Calcium 6.9 (L) 8.9 - 10.3 mg/dL   GFR, Estimated 10 (L) >60 mL/min    Comment: (NOTE) Calculated using the CKD-EPI Creatinine Equation (2021)    Anion gap 16 (H) 5 - 15    Comment: Performed at [facility] [address]  [place],  [address]  Hepatic function panel     Status: Abnormal   Collection Time: 06/30/22  7:27 AM  Result Value Ref Range  Total Protein 5.3 (L) 6.5 - 8.1 g/dL   Albumin 2.1 (L) 3.5 - 5.0 g/dL   AST 136 (H) 15 - 41 U/L   ALT 36 0 - 44 U/L   Alkaline Phosphatase 274 (H) 38 - 126 U/L   Total Bilirubin 10.0 (H) 0.3 - 1.2 mg/dL   Bilirubin, Direct 5.1 (H) 0.0 - 0.2 mg/dL   Indirect Bilirubin 4.9 (H) 0.3 - 0.9 mg/dL    Comment: Performed at [facility] [address], [place], [place] [address]  Prepare platelet pheresis     Status: None (Preliminary result)   Collection Time: 06/30/22  8:23 AM  Result Value Ref Range   Unit Number [id number]    Blood Component Type PLTP2 PSORALEN TREATED    Unit division 00    Status of Unit ALLOCATED    Transfusion Status OK TO TRANSFUSE   Prepare fresh frozen plasma     Status: None (Preliminary result)   Collection Time: 06/30/22  8:23 AM  Result Value Ref Range   Unit Number [id number]    Blood Component Type THAWED PLASMA    Unit division 00    Status of Unit ALLOCATED    Transfusion Status OK TO TRANSFUSE   Glucose, capillary     Status: Abnormal   Collection Time: 06/30/22  9:41 AM  Result Value Ref Range   Glucose-Capillary 61 (L) 70 - 99 mg/dL    Comment: Glucose reference range applies only to samples taken after fasting for at least 8 hours.  I-STAT 7, (LYTES, BLD GAS, ICA, H+H)     Status: Abnormal   Collection Time: 06/30/22  9:46 AM  Result Value Ref Range   pH, Arterial 7.284 (L) 7.35 - 7.45   pCO2 arterial 46.1 32 - 48 mmHg   pO2, Arterial 94 83 - 108 mmHg   Bicarbonate 21.9 20.0 - 28.0 mmol/L   TCO2 23 22 - 32 mmol/L   O2 Saturation 96 %   Acid-base deficit 5.0 (H) 0.0 - 2.0 mmol/L   Sodium 127 (L) 135 - 145 mmol/L   Potassium 5.5 (H) 3.5 - 5.1 mmol/L   Calcium, Ion 0.94 (L) 1.15 - 1.40 mmol/L   HCT 27.0 (L) 39.0 - 52.0 %   Hemoglobin 9.2 (L) 13.0 - 17.0 g/dL   Patient temperature 36.9 C    Collection  site RADIAL, ALLEN'S TEST ACCEPTABLE    Drawn by RT    Sample type ARTERIAL     DG Tibia/Fibula Right Port  Result Date: 06/30/2022 CLINICAL DATA:  Pain and swelling of the right lower extremity. EXAM: PORTABLE RIGHT TIBIA AND FIBULA - 2 VIEW COMPARISON:  Knee radiographs 09/27/2020 FINDINGS: Significant and markedly progressive knee joint degenerative changes. There also is a large knee joint effusion. This could be a progressive inflammatory/erosive arthropathy but could not exclude septic arthritis. Recommend correlation with clinical findings and joint aspiration as indicated. The tibia and fibula are intact. No destructive bony changes. Scattered vascular calcifications. The ankle joint is maintained. IMPRESSION: 1. Significant and markedly progressive knee joint degenerative changes and large knee joint effusion. Progressive inflammatory arthropathy versus septic arthritis. Recommend correlation with clinical findings and joint aspiration as indicated. 2. Intact tibia and fibula.  No destructive bony changes. Electronically Signed   By: Marijo Sanes M.D.   On: 06/30/2022 09:48   DG Chest Port 1 View  Result Date: 06/30/2022 CLINICAL DATA:  Central line placement. EXAM: PORTABLE CHEST 1 VIEW COMPARISON:  06/30/2022, earlier same  day FINDINGS: 0913 hours. Endotracheal tube tip is approximately 3.9 cm above the base of the carina. NG tube tip is in the stomach. Left IJ central line tip projects in the region of the innominate vein confluence and may be tenting the lateral wall of the vessel given the laterally directed position of the tip. Lung volumes remain low with right greater than left patchy diffuse airspace disease. The cardio pericardial silhouette is enlarged. Telemetry leads overlie the chest. IMPRESSION: Left IJ central line tip projects in the region of the innominate vein confluence and may be tenting the lateral wall of the vessel given the laterally directed position of the tip. No  evidence for pneumothorax. Similar appearance of the right greater than left airspace disease. Electronically Signed   By: Misty Stanley M.D.   On: 06/30/2022 09:35   DG Chest Port 1 View  Result Date: 06/30/2022 CLINICAL DATA:  Possible sepsis, rule out aspiration. EXAM: PORTABLE CHEST 1 VIEW COMPARISON:  07/15/2022. FINDINGS: 0533 hours. Developing consolidation in the medial right lower lobe with patchy opacities throughout the right lung, favored to represent aspiration or multifocal pneumonia. New trace right pleural effusion. No pneumothorax. Stable cardiac and mediastinal contours, accounting for patient rotation. IMPRESSION: Developing consolidation in the medial right lower lobe with patchy opacities throughout the right lung, favored to represent aspiration or multifocal pneumonia. New trace right pleural effusion. Electronically Signed   By: Emmit Alexanders M.D.   On: 06/30/2022 08:16   [place] EKG SITE RITE  Result Date: 06/19/2022 If Site Rite image not attached, placement could not be confirmed due to current cardiac rhythm.  CT ABDOMEN PELVIS WO CONTRAST  Result Date: 07/17/2022 CLINICAL DATA:  Acute abdominal pain EXAM: CT ABDOMEN AND PELVIS WITHOUT CONTRAST TECHNIQUE: Multidetector CT imaging of the abdomen and pelvis was performed following the standard protocol without IV contrast. RADIATION DOSE REDUCTION: This exam was performed according to the departmental dose-optimization program which includes automated exposure control, adjustment of the mA and/or kV according to patient size and/or use of iterative reconstruction technique. COMPARISON:  03/15/2021 FINDINGS: Lower chest: Blood pool is hypodense compared to the interventricular septum consistent with anemia. Scattered coronary calcifications. Trace pleural fluid. Scattered airspace opacities in the lung bases right greater than left, new since prior. Hepatobiliary: Interval TIPS. Small partially calcified stones layer in the  dependent aspect of the gallbladder which is nondilated. No definite liver lesion or biliary ductal dilatation. Pancreas: Unremarkable. No pancreatic ductal dilatation or surrounding inflammatory changes. Spleen: Normal in size without focal abnormality. Adrenals/Urinary Tract: No adrenal mass. No urolithiasis or hydronephrosis. Symmetric renal contours. Urinary bladder decompressed by Foley catheter. Stomach/Bowel: Stomach is nondistended, unremarkable. Small bowel decompressed. Normal appendix. Colon is partially distended by gas and fecal material. Vascular/Lymphatic: Scattered aortoiliac calcified atheromatous plaque without aneurysm. TIPS projects in expected location. No abdominal or pelvic adenopathy. Reproductive: Prostate is unremarkable. Other: Trace pelvic fluid.  No abdominal ascites.  No free air. Musculoskeletal: Spondylitic changes throughout the visualized lower thoracic and lumbosacral spine as before. No fracture or other acute finding. IMPRESSION: 1. Scattered airspace opacities in the lung bases right greater than left, new since prior. 2. Interval TIPS, resolution of abdominal ascites. 3. Cholelithiasis. 4. Coronary and aortic Atherosclerosis (ICD10-I70.0). Electronically Signed   By: Lucrezia Europe M.D.   On: 06/19/2022 16:26   CT Head Wo Contrast  Result Date: 06/23/2022 CLINICAL DATA:  Provided history: Mental status change, unknown cause. EXAM: CT HEAD WITHOUT CONTRAST TECHNIQUE: Contiguous axial images were  obtained from the base of the skull through the vertex without intravenous contrast. RADIATION DOSE REDUCTION: This exam was performed according to the departmental dose-optimization program which includes automated exposure control, adjustment of the mA and/or kV according to patient size and/or use of iterative reconstruction technique. COMPARISON:  No pertinent prior exams available for comparison. FINDINGS: Brain: No age advanced or lobar predominant parenchymal atrophy. There is no  acute intracranial hemorrhage. No demarcated cortical infarct. No extra-axial fluid collection. No evidence of an intracranial mass. No midline shift. Vascular: No hyperdense vessel. Skull: No mass or acute finding within the imaged orbits. Sinuses/Orbits: No mass or acute finding within the imaged orbits. Mild mucosal thickening within the bilateral maxillary, sphenoid and ethmoid sinuses. IMPRESSION: 1.  No evidence of an acute intracranial abnormality. 2. Mild paranasal sinus mucosal thickening. Electronically Signed   By: Kellie Simmering D.O.   On: 06/20/2022 16:23   DG Chest Port 1 View  Result Date: 06/30/2022 CLINICAL DATA:  Provided history: Weakness. Altered mental status. Hypoglycemia. EXAM: PORTABLE CHEST 1 VIEW COMPARISON:  Prior chest radiographs 09/07/2021 and earlier. FINDINGS: Heart size at the upper limits of normal. Small ill-defined opacity within the medial right lung base, with an appearance favoring atelectasis. No appreciable airspace consolidation on the left. No evidence of pulmonary edema. No evidence of pleural effusion or pneumothorax. Degenerative changes of the spine. IMPRESSION: Small ill-defined opacity within the medial right lung base, with an appearance favoring atelectasis. Otherwise, no evidence of acute cardiopulmonary abnormality. Electronically Signed   By: Kellie Simmering D.O.   On: 06/27/2022 14:26    Review of Systems  Unable to perform ROS: Intubated   Blood pressure (!) 115/50, pulse 98, temperature 99 F (37.2 C), resp. rate (!) 24, height 6' (1.829 m), weight 106 kg, SpO2 100 %. Physical Exam Constitutional:      General: He is not in acute distress.    Appearance: He is well-developed. He is not diaphoretic.     Comments: Intubated  HENT:     Head: Normocephalic and atraumatic.  Eyes:     General: No scleral icterus.       Right eye: No discharge.        Left eye: No discharge.  Cardiovascular:     Rate and Rhythm: Normal rate and regular rhythm.   Pulmonary:     Effort: Pulmonary effort is normal. No respiratory distress.  Musculoskeletal:     Comments: RLE No traumatic wounds or rash  Ulceration right lateral lower leg, no SQE, mild surrounding induration, no fluctuance  No knee or ankle effusion  Knee stable to varus/ valgus and anterior/posterior stress  Sens DPN, SPN, TN could not assess  Motor EHL, ext, flex, evers could not assess  DP 1+, PT 0, No significant edema  Skin:    General: Skin is warm and dry.  Psychiatric:     Comments: Intubated    Assessment/Plan: Right lower leg wound -- Wound looks stable compared with photos from about a year ago. No signs of nec fac or abscess. CT or MRI would be helpful given his history when he's stable enough. Dr. Sharol Given to evaluate independently.    Lisette Abu, PA-C Orthopedic Surgery [phone] 06/30/2022, 10:08 AM

## 2022-06-30 NOTE — Procedures (Signed)
Central Venous Catheter Insertion Procedure Note  ATARI LEISENRING  [id number]  Nov 04, 1960  Date:06/30/22  Time:9:13 AM   Provider Performing:Brooke Moshe Cipro   Procedure: Insertion of Non-tunneled Central Venous [phone]) with US guidance [id number])   Indication(s) Medication administration  Consent Risks of the procedure as well as the alternatives and risks of each were explained to the patient and/or caregiver.  Consent for the procedure was obtained and is signed in the bedside chart  Anesthesia Topical only with 1% lidocaine   Timeout Verified patient identification, verified procedure, site/side was marked, verified correct patient position, special equipment/implants available, medications/allergies/relevant history reviewed, required imaging and test results available.  Sterile Technique Maximal sterile technique including full sterile barrier drape, hand hygiene, sterile gown, sterile gloves, mask, hair covering, sterile ultrasound probe cover (if used).  Procedure Description Area of catheter insertion was cleaned with chlorhexidine and draped in sterile fashion.  With real-time ultrasound guidance a central venous catheter was placed into the left internal jugular vein. Nonpulsatile blood flow and easy flushing noted in all ports.  The catheter was sutured in place and sterile dressing applied.    Complications/Tolerance None; patient tolerated the procedure well. Chest X-ray is ordered to verify placement for internal jugular or subclavian cannulation.   Chest x-ray is not ordered for femoral cannulation.  EBL Minimal  Specimen(s) None     Kennieth Rad, MSN, AG-ACNP-BC [facility] 06/30/2022, 9:14 AM  See Amion for pager If no response to pager, please call PCCM consult pager After 7:00 pm call Elink

## 2022-06-30 NOTE — Consult Note (Addendum)
[facility] Nurse Consult Note: Reason for Consult: Consult requested for right leg.Performed remotely after review of progress notes and photo in the EMR. Pt is familiar to [facility] team from previous admission.  Right anterior calf with chronic full thickness wound; 50% red, 50% yellow. 8X5cm, according to bedside nursing wound flow sheet, mod amt pink drainage.   Dressing procedure/placement/frequency: Topical treatment orders provided for bedside nurses to perform as follows to assist with removal of nonviable tissue and promote healing: Apply Medihoney to right leg wound Q day, then cover with foam dressing.  Change foam dressing Q 3 days or PRN soiling.  Please re-consult if further assistance is needed.  Thank-you,  Julien Girt MSN, [place], [place], [place], [place]

## 2022-06-30 NOTE — Progress Notes (Signed)
[facility] Progress Note Patient Name: Kyle Pitts DOB: [birth year] MRN: [id number]   Date of Service  06/30/2022  HPI/Events of Note  Patient with paroxysmal wide complex tachycardia during CRRT with UF of 100 ml / hour, most recent electrolytes are from > 12 hours ago, most recent  EF is hyperdynamic with an EF of 70 - 75 %, patient is on 27 mcg of Norepinephrine gtt. EKG with QTC of 320 msec.  eICU Interventions  Amiodarone 150 mg iv x 1, BMP & Mg++ stat, Phenylephrine gtt ordered to help with titration of Norepinephrine down to a less arrhenogenic level, Albumin 25 % 50 gm x 1 to augment intravascular volume via mobilization of third space fluids, UF temporarily paused, if rhythm recurs after Amio bolus will consider Amio gtt.        Frederik Pear 06/30/2022, 9:57 PM

## 2022-06-30 NOTE — Progress Notes (Signed)
  Echocardiogram 2D Echocardiogram has been performed.  Kyle Pitts 06/30/2022, 11:36 AM

## 2022-06-30 NOTE — Progress Notes (Signed)
NAME:  Kyle Pitts, MRN:  [id number], DOB:  1960-10-06, LOS: 1 ADMISSION DATE:  07/07/2022, CONSULTATION DATE:  3/11 REFERRING MD:  Dr. Francia Greaves, CHIEF COMPLAINT:  AMS   History of Present Illness:  62 y/o M who presented to [facility] ER on 3/11 after being found down at home.    Per report, his sister found him down at home. The patient reportedly cares for his sister. Unknown total down time.  EMS called and found his glucose to be 20-30's. Initial ER evaluation notable for multiple metabolic derangements (Na 122, K 5.6, CO2 17, BUn 61/Cr 5.42, iCA 0.84, lactic acid 5.9, WBC 20, Hgb 7.9, platelets 40, and troponin 3,200), hypothermia but stable hemodynamics. COVID/Flu A&B / RSV negative. UDS positive for cocaine & opiates.  He was altered but somewhat interactive on arrival.  He was sent for CT head which was negative for acute process. Additionally, he had a CT ABD that showed scattered airspace opacities in the lung bases R>L, interval TIPS with resolution of the abdominal ascites. CXR showed a small ill-defined opacity in the medial right lung base, suspected to be atelectasis. He was treated with IVF, empiric antibiotics, and pan cultured.  After returning from CT he was hypotensive with SBP in the 60's.  He was given an additional 500 ml NS bolus, additional 50 mEq of sodium bicarbonate and calcium gluconate.   PCCM called for ICU admission.   Pertinent  Medical History  Anemia  ETOH Cirrhosis s/p TIPS Peptic Ulcers - gastric & duodenal  Esophageal Varices  Hyponatremia  Allergic Reaction  Septic Arthritis  Cataracts Group A Strep Infection 2022  Significant Hospital Events: Including procedures, antibiotic start and stop dates in addition to other pertinent events   3/11 Admit after being found down, admitted with septic shock, RLL PNA, RLE wound, AKI, AMS, multiple metabolic derangements and coagulopathy    Interim History / Subjective:  Poor mental status overnight- moans,  desaturating, on NRB Maxed on peripheral NE and still hypotensive Worsening edema and appears the start of blistering to RLE   Objective   Blood pressure (!) 115/50, pulse 98, temperature 99 F (37.2 C), resp. rate (!) 24, height 6' (1.829 m), weight 106 kg, SpO2 100 %.    Vent Mode: PRVC FiO2 (%):  [100 %] 100 % Set Rate:  [20 bmp] 20 bmp Vt Set:  [id number] mL] 620 mL PEEP:  [5 cmH20] 5 cmH20 Plateau Pressure:  [22 cmH20] 22 cmH20   Intake/Output Summary (Last 24 hours) at 06/30/2022 [id number] Last data filed at 06/30/2022 0500 Gross per 24 hour  Intake 4886.07 ml  Output 290 ml  Net 4596.07 ml   Filed Weights   06/30/22 0706  Weight: 106 kg    Examination: General:  Acutely ill adult male sitting upright in bed  HEENT: MM pink/dry, dried blood in mouth, pupils 4/reactive, icterus present Neuro:  responsive to noxious stimuli, moans, does not f/c, move spont extremitites CV: rr, ST, no murmur PULM:  labored breathing, clear, rales in right base, no wheeze GI: soft, bs hypo, ND, foley Extremities: warm/dry, RLE with increasing edema with wound with erythemous base with dressing, with worsening erythema and developing circular red rash, some appears to beginning to blister around wound , +1dp Skin: developing petechial rash to chest/ neck    Tmax 99.3 UOP since admit 190 Net +4.5L  Labs> Na 127, K 6.2, Cl 96, bicarb 15, glucose 66, BUN 66, sCr 5.88, alk phos 274, AST 136,  t. Bili 10, direct 5.1, Trop hs 3345> 3256, lactic 6.6> 5.7, WBC 20> 36.8, H/H 7.6/ 21.7, plts 40> 32   Resolved Hospital Problem list     Assessment & Plan:   Septic Shock with MRSA bacteremia with suspected source of RLE cellulitis and r/o RLL PNA Lactic acidosis  Chronic hypotension  P:  - CVL and Aline placement now - cont NE for MAP goal > 65, add vasopressin - cont home midodrine, increase to '15mg'$  TID - add on stress dose steroids  - worsening edema, erythema and rash and starting of blistering  concerning for necrotizing fasciitis vs septic arthritis vs worsening cellulitis.  Ortho consulted.  Stat portable tib/ fib xray for now given current hemodynamic instability.  Consider further imaging/ recs pending CXR.   - cont vanc and cefepime for now - ID auto consult pending, appreciate further abx recs - echo ordered to evaluate for IE - trend lactate, check coox, DIC panel, cortisol  - trend CVP - follow cultures/ trend WBC/ fever curve - lactate may be slow to clear with LFTs - correction of metabolic derangement as below to help with hemodynamics  - suspect AKI and liver shock 2/2 severe septic shock but consider HRS if not improving  Acute hypoxic respiratory failure  - intubate now for worsening mental status and hemodynamic instability - MV support, 4-8cc/kg IBW with goal Pplat <30 and DP<15  - VAP prevention protocol/ PPI - PAD protocol for sedation> fentanyl w/ prn versed, RASS goal 0/-1 - wean FiO2 as able for SpO2 >92%  - daily SAT & SBT when appropiate - CXR/ ABG post intubation  - send trach asp - prn BD  Elevated Troponin  Cocaine Abuse ? ETOH abuse  Suspect in setting of demand from cocaine use No changes on EKG - pending echo, checking coox to rule out cardiac component.  - trop hs 2597> 3345> 3256> no empiric AC for now given severe coagulapathy  - empiric thiamine/ folate/ MVI - monitor for ETOH/ drug withdrawal - substance abuse counseling  AKI Hyperkalemia without EKG changes  Hyponatremia  AGMA/ lactic acidosis  Multiple metabolic derangements  - remains oliguric with worsening renal function and continued metabolic derangements.  No obstruction or hydro noted on CT - Nephrology consulted for CRRT.  Appreciate assistance - monitor for hyperkalemia changes on telemetry - will place temp HD catheter after plts/ FFP given coagulapathy - cont foley - bicarb gtt for now till CRRT started then per Nephrology  - Replace electrolytes as indicated -  calcium gluconate - Avoid nephrotoxic agents, ensure adequate renal perfusion, renal dose meds   Acute Metabolic Encephalopathy  Hyperammonemia  - CTH neg on admit - airway protection as above - trend ammonia.  Lactulose/ CRRT for hyperammonemia - serial neuro exams  - correct metabolic derangements, continued supportive care as above  ETOH Cirrhosis  ESLD s/p TIPS Coagulopathy, thrombocytopenia, r/o DIC Liver shock  Followed by Dr. Candis Schatz at [facility].  Hx of frequent para's, up to 7L per week, prior to TIPS. MELD-Na Score 40.   - cont PPI - recheck coags above and trend - trend LFTs - no current evidence of bleeding, high risk, continue to monitor  - no abdominal ascites on CT noted  - hold home agents for cirrhosis  - lactulose per OGT - if worsening LFTs, consider GI consult   Macrocytic Anemia  - stat T&S - dried blood in oropharynx but nothing noted from OGT, possibly from thrombocytopenia, monitor closely.  Recheck H/H at 1600 - transfuse for Hgb < 7 or significant bleeding - empiric thiamine/folate IV   Hypoglycemia  - refractory, likely from ESLF, cont D10, titrate as needed, consider starting trickle TF - CBGs Q4hrs and prn   Best Practice (right click and "Reselect all SmartList Selections" daily)  Diet/type: NPO DVT prophylaxis: SCD GI prophylaxis: PPI Lines: Central line, Arterial Line, and yes and it is still needed Foley:  Yes, and it is still needed Code Status:  full code Last date of multidisciplinary goals of care discussion: pending, default full code.   Son, Naoto Colindres [phone] updated by phone of events and severe septic shock with MODS.  Is driving in from [facility] today.   Labs   CBC: Recent Labs  Lab 06/20/2022 1353 07/01/2022 1409 07/05/2022 1410 07/09/2022 1824 06/30/22 0358  WBC 20.2*  --   --   --  36.8*  NEUTROABS 18.6*  --   --   --   --   HGB 7.9* 8.5* 8.5* 8.2* 7.6*  HCT 23.8* 25.0* 25.0* 24.0* 21.7*  MCV 108.2*  --   --   --   103.3*  PLT 40*  --   --   --  32*    Basic Metabolic Panel: Recent Labs  Lab 06/21/2022 1353 07/18/2022 1409 06/21/2022 1410 07/08/2022 1824 07/07/2022 1930 06/30/22 0358 06/30/22 0727  NA 122*   < > 121* 125* 126* 125* 127*  K 5.6*   < > 5.8* 5.2* 5.3* 6.2* 6.2*  CL 89*  --  91*  --  94* 97* 96*  CO2 17*  --   --   --  14* 17* 15*  GLUCOSE 185*  --  163*  --  95 66* 63*  BUN 61*  --  68*  --  60* 65* 66*  CREATININE 5.42*  --  6.10*  --  5.26* 5.68* 5.88*  CALCIUM 7.1*  --   --   --  7.0* 6.8* 6.9*  MG  --   --   --   --   --  1.8  --   PHOS  --   --   --   --   --  5.1*  --    < > = values in this interval not displayed.   GFR: Estimated Creatinine Clearance: 16.8 mL/min (A) (by C-G formula based on SCr of 5.88 mg/dL (H)). Recent Labs  Lab 07/01/2022 1353 06/26/2022 1805 07/18/2022 2010 07/04/2022 2220 06/30/22 0358  WBC 20.2*  --   --   --  36.8*  LATICACIDVEN 5.7* 7.0* 6.6* 5.7*  --     Liver Function Tests: Recent Labs  Lab 07/14/2022 1353 06/30/22 0727  AST 93* 136*  ALT 23 36  ALKPHOS 217* 274*  BILITOT 7.9* 10.0*  PROT 5.1* 5.3*  ALBUMIN 1.9* 2.1*   No results for input(s): "LIPASE", "AMYLASE" in the last 168 hours. Recent Labs  Lab 06/24/2022 1353  AMMONIA 74*    ABG    Component Value Date/Time   PHART 7.334 (L) 06/19/2022 1824   PCO2ART 30.9 (L) 07/13/2022 1824   PO2ART 40 (LL) 06/19/2022 1824   HCO3 16.7 (L) 06/21/2022 1824   TCO2 18 (L) 06/23/2022 1824   ACIDBASEDEF 9.0 (H) 06/28/2022 1824   O2SAT 76 06/22/2022 1824     Coagulation Profile: Recent Labs  Lab 07/17/2022 1353  INR 3.7*    Cardiac Enzymes: Recent Labs  Lab 07/14/2022 1353  CKTOTAL 391    HbA1C:  No results found for: "HGBA1C"  CBG: Recent Labs  Lab 07/01/2022 1805 06/22/2022 1910 07/06/2022 2138 07/16/2022 2329 06/30/22 0315  GLUCAP 67* 96 78 75 72     Critical care time: 75 minutes        Kennieth Rad, MSN, AG-ACNP-BC [facility] 06/30/2022,  8:38 AM  See Amion for pager If no response to pager, please call PCCM consult pager After 7:00 pm call Elink

## 2022-06-30 NOTE — Procedures (Signed)
Arterial Catheter Insertion Procedure Note  BRAVLIO MURAD  [id number]  01/22/1961  Date:06/30/22  Time:9:44 AM    Provider Performing: Rossie Muskrat R    Procedure: Insertion of Arterial Line [id number]) without US guidance  Indication(s) Blood pressure monitoring and/or need for frequent ABGs  Consent Unable to obtain consent due to emergent nature of procedure.  Anesthesia None   Time Out Verified patient identification, verified procedure, site/side was marked, verified correct patient position, special equipment/implants available, medications/allergies/relevant history reviewed, required imaging and test results available.   Sterile Technique Maximal sterile technique including full sterile barrier drape, hand hygiene, sterile gown, sterile gloves, mask, hair covering, sterile ultrasound probe cover (if used).   Procedure Description Area of catheter insertion was cleaned with chlorhexidine and draped in sterile fashion. Without real-time ultrasound guidance an arterial catheter was placed into the right radial artery.  Appropriate arterial tracings confirmed on monitor.     Complications/Tolerance None; patient tolerated the procedure well.      Specimen(s) None

## 2022-06-30 NOTE — Progress Notes (Signed)
PCCM Interval Note   CVP 16 after blood products  Requiring higher O2 with brief desaturations while lying flat CXR c/w pulmonary edema Wean FiO2/ PEEP as tolerated Discussed with Nephrology - will attempt to start to pull fluid if tolerating CRRT even UF  Pending echo      Kennieth Rad, MSN, AG-ACNP-BC [facility] 06/30/2022, 1:35 PM  See Amion for pager If no response to pager, please call PCCM consult pager After 7:00 pm call Elink

## 2022-07-01 ENCOUNTER — Inpatient Hospital Stay (HOSPITAL_COMMUNITY): Payer: Medicaid Other

## 2022-07-01 DIAGNOSIS — B9562 Methicillin resistant Staphylococcus aureus infection as the cause of diseases classified elsewhere: Secondary | ICD-10-CM | POA: Diagnosis not present

## 2022-07-01 DIAGNOSIS — I472 Ventricular tachycardia, unspecified: Secondary | ICD-10-CM

## 2022-07-01 DIAGNOSIS — R7881 Bacteremia: Secondary | ICD-10-CM | POA: Diagnosis not present

## 2022-07-01 DIAGNOSIS — A419 Sepsis, unspecified organism: Secondary | ICD-10-CM | POA: Diagnosis not present

## 2022-07-01 DIAGNOSIS — I87331 Chronic venous hypertension (idiopathic) with ulcer and inflammation of right lower extremity: Secondary | ICD-10-CM | POA: Diagnosis not present

## 2022-07-01 DIAGNOSIS — M00061 Staphylococcal arthritis, right knee: Secondary | ICD-10-CM | POA: Diagnosis not present

## 2022-07-01 DIAGNOSIS — N179 Acute kidney failure, unspecified: Secondary | ICD-10-CM | POA: Diagnosis not present

## 2022-07-01 DIAGNOSIS — I471 Supraventricular tachycardia, unspecified: Secondary | ICD-10-CM

## 2022-07-01 LAB — POCT I-STAT 7, (LYTES, BLD GAS, ICA,H+H)
Acid-base deficit: 4 mmol/L — ABNORMAL HIGH (ref 0.0–2.0)
Acid-base deficit: 4 mmol/L — ABNORMAL HIGH (ref 0.0–2.0)
Bicarbonate: 21.1 mmol/L (ref 20.0–28.0)
Bicarbonate: 24.6 mmol/L (ref 20.0–28.0)
Calcium, Ion: 0.81 mmol/L — CL (ref 1.15–1.40)
Calcium, Ion: 1.06 mmol/L — ABNORMAL LOW (ref 1.15–1.40)
HCT: 22 % — ABNORMAL LOW (ref 39.0–52.0)
HCT: 27 % — ABNORMAL LOW (ref 39.0–52.0)
Hemoglobin: 7.5 g/dL — ABNORMAL LOW (ref 13.0–17.0)
Hemoglobin: 9.2 g/dL — ABNORMAL LOW (ref 13.0–17.0)
O2 Saturation: 98 %
O2 Saturation: 95 %
Patient temperature: 36.9
Patient temperature: 98.2
Potassium: 4.8 mmol/L (ref 3.5–5.1)
Potassium: 4.6 mmol/L (ref 3.5–5.1)
Sodium: 143 mmol/L (ref 135–145)
Sodium: 131 mmol/L — ABNORMAL LOW (ref 135–145)
TCO2: 22 mmol/L (ref 22–32)
TCO2: 26 mmol/L (ref 22–32)
pCO2 arterial: 38.1 mmHg (ref 32–48)
pCO2 arterial: 61 mmHg — ABNORMAL HIGH (ref 32–48)
pH, Arterial: 7.35 (ref 7.35–7.45)
pH, Arterial: 7.212 — ABNORMAL LOW (ref 7.35–7.45)
pO2, Arterial: 102 mmHg (ref 83–108)
pO2, Arterial: 90 mmHg (ref 83–108)

## 2022-07-01 LAB — PREPARE PLATELET PHERESIS: Unit division: 0

## 2022-07-01 LAB — PROTIME-INR
INR: 3 — ABNORMAL HIGH (ref 0.8–1.2)
Prothrombin Time: 31 seconds — ABNORMAL HIGH (ref 11.4–15.2)

## 2022-07-01 LAB — RENAL FUNCTION PANEL
Albumin: 2.6 g/dL — ABNORMAL LOW (ref 3.5–5.0)
Albumin: 2.6 g/dL — ABNORMAL LOW (ref 3.5–5.0)
Anion gap: 9 (ref 5–15)
Anion gap: 10 (ref 5–15)
BUN: 48 mg/dL — ABNORMAL HIGH (ref 6–20)
BUN: 41 mg/dL — ABNORMAL HIGH (ref 6–20)
CO2: 23 mmol/L (ref 22–32)
CO2: 22 mmol/L (ref 22–32)
Calcium: 7.1 mg/dL — ABNORMAL LOW (ref 8.9–10.3)
Calcium: 7.4 mg/dL — ABNORMAL LOW (ref 8.9–10.3)
Chloride: 96 mmol/L — ABNORMAL LOW (ref 98–111)
Chloride: 97 mmol/L — ABNORMAL LOW (ref 98–111)
Creatinine, Ser: 3.92 mg/dL — ABNORMAL HIGH (ref 0.61–1.24)
Creatinine, Ser: 3.22 mg/dL — ABNORMAL HIGH (ref 0.61–1.24)
GFR, Estimated: 17 mL/min — ABNORMAL LOW (ref >60)
GFR, Estimated: 21 mL/min — ABNORMAL LOW (ref >60)
Glucose, Bld: 127 mg/dL — ABNORMAL HIGH (ref 70–99)
Glucose, Bld: 120 mg/dL — ABNORMAL HIGH (ref 70–99)
Phosphorus: 5.8 mg/dL — ABNORMAL HIGH (ref 2.5–4.6)
Phosphorus: 5.3 mg/dL — ABNORMAL HIGH (ref 2.5–4.6)
Potassium: 5 mmol/L (ref 3.5–5.1)
Potassium: 5.3 mmol/L — ABNORMAL HIGH (ref 3.5–5.1)
Sodium: 128 mmol/L — ABNORMAL LOW (ref 135–145)
Sodium: 129 mmol/L — ABNORMAL LOW (ref 135–145)

## 2022-07-01 LAB — GLUCOSE, CAPILLARY
Glucose-Capillary: 137 mg/dL — ABNORMAL HIGH (ref 70–99)
Glucose-Capillary: 90 mg/dL (ref 70–99)
Glucose-Capillary: 106 mg/dL — ABNORMAL HIGH (ref 70–99)
Glucose-Capillary: 112 mg/dL — ABNORMAL HIGH (ref 70–99)
Glucose-Capillary: 130 mg/dL — ABNORMAL HIGH (ref 70–99)
Glucose-Capillary: 111 mg/dL — ABNORMAL HIGH (ref 70–99)

## 2022-07-01 LAB — HEPATIC FUNCTION PANEL
ALT: 39 U/L (ref 0–44)
AST: 145 U/L — ABNORMAL HIGH (ref 15–41)
Albumin: 2.6 g/dL — ABNORMAL LOW (ref 3.5–5.0)
Alkaline Phosphatase: 283 U/L — ABNORMAL HIGH (ref 38–126)
Bilirubin, Direct: 6 mg/dL — ABNORMAL HIGH (ref 0.0–0.2)
Indirect Bilirubin: 6.5 mg/dL — ABNORMAL HIGH (ref 0.3–0.9)
Total Bilirubin: 12.5 mg/dL — ABNORMAL HIGH (ref 0.3–1.2)
Total Protein: 5.8 g/dL — ABNORMAL LOW (ref 6.5–8.1)

## 2022-07-01 LAB — CBC
HCT: 22.4 % — ABNORMAL LOW (ref 39.0–52.0)
Hemoglobin: 7.5 g/dL — ABNORMAL LOW (ref 13.0–17.0)
MCH: 35.7 pg — ABNORMAL HIGH (ref 26.0–34.0)
MCHC: 33.5 g/dL (ref 30.0–36.0)
MCV: 106.7 fL — ABNORMAL HIGH (ref 80.0–100.0)
Platelets: 32 10*3/uL — ABNORMAL LOW (ref 150–400)
RBC: 2.1 MIL/uL — ABNORMAL LOW (ref 4.22–5.81)
RDW: 16.9 % — ABNORMAL HIGH (ref 11.5–15.5)
WBC: 30.6 10*3/uL — ABNORMAL HIGH (ref 4.0–10.5)
nRBC: 1.4 % — ABNORMAL HIGH (ref 0.0–0.2)

## 2022-07-01 LAB — BPAM PLATELET PHERESIS
Blood Product Expiration Date: 202403122359
ISSUE DATE / TIME: 202403121034
Unit Type and Rh: 6200

## 2022-07-01 LAB — PREPARE FRESH FROZEN PLASMA: Unit division: 0

## 2022-07-01 LAB — BPAM FFP
Blood Product Expiration Date: 202403122359
ISSUE DATE / TIME: 202403121037
Unit Type and Rh: 6200

## 2022-07-01 LAB — PHOSPHORUS: Phosphorus: 5.8 mg/dL — ABNORMAL HIGH (ref 2.5–4.6)

## 2022-07-01 LAB — LACTIC ACID, PLASMA: Lactic Acid, Venous: 3 mmol/L (ref 0.5–1.9)

## 2022-07-01 LAB — MAGNESIUM: Magnesium: 2.3 mg/dL (ref 1.7–2.4)

## 2022-07-01 LAB — VANCOMYCIN, RANDOM: Vancomycin Rm: 13 ug/mL

## 2022-07-01 MED ORDER — PRISMASOL BGK 4/2.5 32-4-2.5 MEQ/L REPLACEMENT SOLN
Status: DC
  Filled 2022-07-01 (×5): qty 5000

## 2022-07-01 MED ORDER — AMIODARONE HCL IN DEXTROSE 360-4.14 MG/200ML-% IV SOLN
60.0000 mg/h | INTRAVENOUS | Status: DC
  Administered 2022-07-01 (×2): 60 mg/h via INTRAVENOUS
  Filled 2022-07-01 (×2): qty 200

## 2022-07-01 MED ORDER — CALCIUM GLUCONATE-NACL 2-0.675 GM/100ML-% IV SOLN
2.0000 g | Freq: Once | INTRAVENOUS | Status: DC
  Filled 2022-07-01: qty 100

## 2022-07-01 MED ORDER — AMIODARONE HCL IN DEXTROSE 360-4.14 MG/200ML-% IV SOLN
INTRAVENOUS | Status: AC
  Filled 2022-07-01: qty 200

## 2022-07-01 MED ORDER — AMIODARONE HCL IN DEXTROSE 360-4.14 MG/200ML-% IV SOLN
30.0000 mg/h | INTRAVENOUS | Status: DC
  Administered 2022-07-01 – 2022-07-03 (×5): 30 mg/h via INTRAVENOUS
  Filled 2022-07-01 (×5): qty 200

## 2022-07-01 MED ORDER — NOREPINEPHRINE 16 MG/250ML-% IV SOLN
0.0000 ug/min | INTRAVENOUS | Status: DC
  Administered 2022-07-01: 10 ug/min via INTRAVENOUS
  Administered 2022-07-02 – 2022-07-03 (×2): 15 ug/min via INTRAVENOUS
  Filled 2022-07-01 (×4): qty 250

## 2022-07-01 MED ORDER — AMIODARONE IV BOLUS ONLY 150 MG/100ML
150.0000 mg | Freq: Once | INTRAVENOUS | Status: AC
  Administered 2022-07-01: 150 mg via INTRAVENOUS
  Filled 2022-07-01: qty 100

## 2022-07-01 MED ORDER — DARBEPOETIN ALFA 200 MCG/0.4ML IJ SOSY
200.0000 ug | PREFILLED_SYRINGE | Freq: Once | INTRAMUSCULAR | Status: AC
  Administered 2022-07-01: 200 ug via SUBCUTANEOUS
  Filled 2022-07-01: qty 0.4

## 2022-07-01 MED ORDER — PRISMASOL BGK 4/2.5 32-4-2.5 MEQ/L REPLACEMENT SOLN
Status: DC
  Filled 2022-07-01 (×4): qty 5000

## 2022-07-01 MED ORDER — FENTANYL 2500MCG IN NS 250ML (10MCG/ML) PREMIX INFUSION
INTRAVENOUS | Status: AC
  Filled 2022-07-01: qty 250

## 2022-07-01 MED ORDER — AMIODARONE LOAD VIA INFUSION
150.0000 mg | Freq: Once | INTRAVENOUS | Status: AC
  Administered 2022-07-01: 150 mg via INTRAVENOUS
  Filled 2022-07-01: qty 83.34

## 2022-07-01 MED ORDER — AMIODARONE LOAD VIA INFUSION
150.0000 mg | Freq: Once | INTRAVENOUS | Status: DC
  Filled 2022-07-01: qty 83.34

## 2022-07-01 MED ORDER — EPINEPHRINE 1 MG/10ML IJ SOSY
PREFILLED_SYRINGE | INTRAMUSCULAR | Status: AC
  Filled 2022-07-01: qty 20

## 2022-07-01 MED ORDER — CALCIUM GLUCONATE-NACL 1-0.675 GM/50ML-% IV SOLN
1.0000 g | Freq: Once | INTRAVENOUS | Status: AC
  Administered 2022-07-01: 1000 mg via INTRAVENOUS

## 2022-07-01 MED ORDER — FENTANYL CITRATE (PF) 2500 MCG/50ML IJ SOLN
0.0000 ug/h | Status: DC
  Administered 2022-07-01: 200 ug/h via INTRAVENOUS
  Administered 2022-07-02: 150 ug/h via INTRAVENOUS
  Filled 2022-07-01 (×2): qty 100

## 2022-07-01 MED ORDER — PRISMASOL BGK 4/2.5 32-4-2.5 MEQ/L EC SOLN
Status: DC
  Filled 2022-07-01 (×18): qty 5000

## 2022-07-01 MED ORDER — CALCIUM GLUCONATE-NACL 1-0.675 GM/50ML-% IV SOLN
INTRAVENOUS | Status: AC
  Filled 2022-07-01: qty 100

## 2022-07-01 MED ORDER — PHENYLEPHRINE CONCENTRATED 100MG/250ML (0.4 MG/ML) INFUSION SIMPLE
0.0000 ug/min | INTRAVENOUS | Status: DC
  Administered 2022-07-01: 75 ug/min via INTRAVENOUS
  Administered 2022-07-01 – 2022-07-03 (×8): 400 ug/min via INTRAVENOUS
  Filled 2022-07-01 (×6): qty 250
  Filled 2022-07-01: qty 500
  Filled 2022-07-01 (×6): qty 250

## 2022-07-01 NOTE — Progress Notes (Signed)
NAME:  Kyle Pitts, MRN:  [id number], DOB:  [birth year], LOS: 2 ADMISSION DATE:  07/17/2022, CONSULTATION DATE:  3/11 REFERRING MD:  Dr. Francia Greaves, CHIEF COMPLAINT:  AMS   History of Present Illness:  62 y/o M who presented to [facility] ER on 3/11 after being found down at home.    Per report, his sister found him down at home. The patient reportedly cares for his sister. Unknown total down time.  EMS called and found his glucose to be 20-30's. Initial ER evaluation notable for multiple metabolic derangements (Na 122, K 5.6, CO2 17, BUn 61/Cr 5.42, iCA 0.84, lactic acid 5.9, WBC 20, Hgb 7.9, platelets 40, and troponin 3,200), hypothermia but stable hemodynamics. COVID/Flu A&B / RSV negative. UDS positive for cocaine & opiates.  He was altered but somewhat interactive on arrival.  He was sent for CT head which was negative for acute process. Additionally, he had a CT ABD that showed scattered airspace opacities in the lung bases R>L, interval TIPS with resolution of the abdominal ascites. CXR showed a small ill-defined opacity in the medial right lung base, suspected to be atelectasis. He was treated with IVF, empiric antibiotics, and pan cultured.  After returning from CT he was hypotensive with SBP in the 60's.  He was given an additional 500 ml NS bolus, additional 50 mEq of sodium bicarbonate and calcium gluconate.   PCCM called for ICU admission.   Pertinent  Medical History  Anemia  ETOH Cirrhosis s/p TIPS Peptic Ulcers - gastric & duodenal  Esophageal Varices  Hyponatremia  Allergic Reaction  Septic Arthritis  Cataracts Group A Strep Infection 2022  Significant Hospital Events: Including procedures, antibiotic start and stop dates in addition to other pertinent events   3/11 Admit after being found down, admitted with septic shock, RLL PNA, RLE wound, AKI, AMS, multiple metabolic derangements and coagulopathy  3/12 intubated due poor mental status, worsening shock, multiple pressors,  CRRT  Interim History / Subjective:  Ectopy overnight with VT, ST w/ PACs s/p amio bolus '150mg'$  and albumin  CVP ~13 UF even to positive overnight Afebrile  Became very unstable, fluctuating BP's on pressors, high PIP without apparent cause, sedation increased, then developed recurrent SVT and VT requiring cardioversion, amio bolus x 2 then gtt.  Cardiology consulted and was at bedside.   Objective   Blood pressure (!) 135/58, pulse 72, temperature 98.6 F (37 C), resp. rate (!) 6, height 6' (1.829 m), weight 109.2 kg, SpO2 99 %. CVP:  [9 mmHg-24 mmHg] 18 mmHg  Vent Mode: PRVC FiO2 (%):  [60 %-100 %] 60 % Set Rate:  [20 bmp-24 bmp] 24 bmp Vt Set:  [620 mL] 620 mL PEEP:  [5 cmH20-8 cmH20] 8 cmH20 Plateau Pressure:  [9 cmH20-22 cmH20] 22 cmH20   Intake/Output Summary (Last 24 hours) at 07/01/2022 0730 Last data filed at 07/01/2022 0700 Gross per 24 hour  Intake 4399.93 ml  Output 1556.7 ml  Net 2843.23 ml   Filed Weights   06/30/22 0706 07/01/22 0500  Weight: 106 kg 109.2 kg   Examination: General:  critically ill older male HEENT: MM pink/moist, dried blood in mouth, no active bleeding, ETT/ OGT, pupils 3/reactive, icterus  Neuro: sedated CV: rr ir, no murmur PULM:  MV breaths, bilateral rales, no wheeze, scant secretions GI: obese, +bs, foley Extremities: warm/dry, developing anasarca, RLE remains edematous, RLE wound   Remains on vaso 0.3, NE 78mg, Neo 55, fentanyl 100> 200 Afebrile UOP 203/ 24hrs +2.8L/ 24hrs Net +  7.4L  Labs> WBC 36.8> 30.6, H/H 7.6> 7.5, plts 32, INR 3, Na 128, K 5, BUN/ sCr 66/ 5.91> 48/ 3.92, iCa 0.81 (corrected Ca 8.2), AST 145, t. Bili 9.9> 12.5, lactic 3.5> 3 CXR> persistent bilateral infiltrates, lines stable  Resolved Hospital Problem list     Assessment & Plan:   Septic Shock with MRSA bacteremia with MODS Chronic hypotension  Chronic RLE extremity wound - suspected source chronic RLE wound, possible septic arthritis in R knee, r/o  PNA.  Previous hx of septic arthritis/GAS in R Knee 2022 P:  - ID following, appreciate input - cont vanc per ID - repeat BC when able to have line holiday - TTE> no evidence of vegetation, EF 70-75%, no RWA, RV hyperdynamic w/moderately elevated PASP,  will need TEE eventually - cont NE, Neo, and vaso for MAP goal > 65.  Minimize NE with arrhythmias as below  - trend CVPs - trend CBC/ fever curve - follow culture data - Appreciate ortho assistance.  WOC for wound care.  Ideally would like R knee joint aspiration given hx of septic arthritis and current bacteremia but given coagulapathy, will hold for now given risk of bleeding and patient is not stable for any washout/ OR at this time.  No concerns of nec fascitis 3/12. Consider vascular workup given nonhealing RLE wound after critical illness  Vtach/ SVT Elevated troponin's - suspected in setting of demand from cocaine use +/- severe septic shock.  No ST changes on EKG - reassuring coox yest and echo as above - appreciate cardiology input - no heparin given coagulapathy - s/p cardioversion and amio bolus x 2 f/b gtt 3/13.  Continue amio gtt for now - optimize electrolytes K> 4, Mag> 2, monitor calcium, corrected better than iCa - consider ischemic workup once recovered  Acute hypoxic respiratory failure  PNA +/- pulmonary edema  - full MV support, 4-8cc/kg IBW with goal Pplat <30 and DP<15 but today w/ PIP 30-40s/ plat 30.  ABG reassuring but changed to PCV given better pressures.  Repeat ABG in 1hr.  Monitor TVs/ MVe closely - PF ratio 170  - intermittent CXR/ ABG - pending trach asp> GPC's thus far - PAD protocol with fentanyl/ versed w/ bowel regimen - wean PEEP/ FiO2 as able for SpO2 >92%  - prn BD - volume removal per CRRT   Cocaine Abuse ? ETOH abuse  - son is not aware of current ETOH use, reports sobriety for 1.75yr.   - empiric thiamine/ folate/ MVI - PAD protocol/ monitor for withdrawal - substance abuse  counseling  AKI> now likely ATN AGMA/ lactic acidosis  Multiple metabolic derangements > hyperkalemia, hyponatremia, hypocalcemia  Anasarca  - appreciate Nephrology assistance - CRRT, neg UF as tolerated hemodynamically  - strict I/Os, cont foley, daily wts  - defer electrolytes to Renal - Avoid nephrotoxic agents, ensure adequate renal perfusion, renal dose meds   Acute Metabolic Encephalopathy  Hyperammonemia  - CTH neg on admit - airway protection as above - void extreme fluctuations in BP with coagulapathy  - trend ammonia.  Lactulose/ CRRT for hyperammonemia - serial neuro exams  - correct metabolic derangements, continued supportive care as above  ETOH Cirrhosis  ESLD s/p TIPS, G1 distal esophageal varices  Coagulopathy, thrombocytopenia, r/o DIC Liver shock  Followed by Dr. [facility]andis Schatz[facility]  Hx of frequent para's, up to 7L per week, prior to TIPS. MELD-Na Score 40.   - cont PPI - DIC panel with consumed factors but no  schistocytes - AST stable, t. Bili up.  INR stable 3, no evidence of bleeding, H/H stable - no evidence of acute pathology on CT a/p, no ascites, therefore thought to be ischemic shock.  If t. Bili not improving, consider GI consult - hold home agents for cirrhosis  - lactulose per OGT  Macrocytic Anemia  - T&S q 72hrs - stable H/H, no signs of bleeding - transfuse for Hgb < 7 or significant bleeding  Hypoglycemia  - still needing D10 despite adding trickle TF's.  Increasing TF as tolerated - cont to monitor CBG closely  Best Practice (right click and "Reselect all SmartList Selections" daily)  Diet/type: NPO; TF DVT prophylaxis: SCD GI prophylaxis: PPI Lines: Central line, Dialysis Catheter, Arterial Line, and yes and it is still needed Foley:  Yes, and it is still needed Code Status:  full code Last date of multidisciplinary goals of care discussion:  3/12.  [place], [place] [phone] updated by phone during events this morning.   Now at bedside.  Will continue current medical care and continue to assess for improvements.  Aware of critical illness and relayed uncertainty that patient may not survive this hospitalization given underlying chronic conditions and current MODS, septic shock, bacteremia, ARF, respiratory failure, and cardiac issues.  Very tenuous status this morning.   Labs   CBC: Recent Labs  Lab 07/01/2022 1353 07/08/2022 1409 06/30/22 0358 06/30/22 0946 06/30/22 1011 06/30/22 1158 06/30/22 1225 07/01/22 0509  WBC 20.2*  --  36.8*  --   --   --   --  30.6*  NEUTROABS 18.6*  --   --   --   --   --   --   --   HGB 7.9*   < > 7.6* 9.2* 7.8* 8.5*  --  7.5*  HCT 23.8*   < > 21.7* 27.0* 23.2* 25.0*  --  22.4*  MCV 108.2*  --  103.3*  --   --   --   --  106.7*  PLT 40*  --  32*  --   --   --  59* 32*   < > = values in this interval not displayed.    Basic Metabolic Panel: Recent Labs  Lab 06/30/22 0358 06/30/22 0727 06/30/22 0946 06/30/22 1015 06/30/22 1158 06/30/22 1653 06/30/22 2155 07/01/22 0509  NA 125* 127* 127* 127* 127*  --  126* 128*  K 6.2* 6.2* 5.5* 5.6* 5.7*  --  5.5* 5.0  CL 97* 96*  --  95*  --   --  92* 96*  CO2 17* 15*  --  20*  --   --  23 23  GLUCOSE 66* 63*  --  60*  --   --  130* 127*  BUN 65* 66*  --  66*  --   --  56* 48*  CREATININE 5.68* 5.88*  --  5.91*  --   --  4.58* 3.92*  CALCIUM 6.8* 6.9*  --  6.8*  --   --  7.0* 7.1*  MG 1.8  --   --  2.1  --  2.1 2.2 2.3  PHOS 5.1*  --   --  6.0*  --  7.0*  --  5.8*  5.8*   GFR: Estimated Creatinine Clearance: 25.6 mL/min (A) (by C-G formula based on SCr of 3.92 mg/dL (H)). Recent Labs  Lab 06/19/2022 1353 06/21/2022 1805 07/17/2022 2010 07/05/2022 2220 06/30/22 0358 06/30/22 1011 07/01/22 0509  WBC 20.2*  --   --   --  36.8*  --  30.6*  LATICACIDVEN 5.7*   < > 6.6* 5.7*  --  3.5* 3.0*   < > = values in this interval not displayed.    Liver Function Tests: Recent Labs  Lab 07/02/2022 1353 06/30/22 0727 06/30/22 1015  07/01/22 0509  AST 93* 136* 145*  --   ALT 23 36 39  --   ALKPHOS 217* 274* 305*  --   BILITOT 7.9* 10.0* 9.9*  --   PROT 5.1* 5.3* 5.3*  --   ALBUMIN 1.9* 2.1* 2.1* 2.6*   No results for input(s): "LIPASE", "AMYLASE" in the last 168 hours. Recent Labs  Lab 06/28/2022 1353 06/30/22 1245  AMMONIA 74* 65*    ABG    Component Value Date/Time   PHART 7.311 (L) 06/30/2022 1158   PCO2ART 43.2 06/30/2022 1158   PO2ART 61 (L) 06/30/2022 1158   HCO3 22.0 06/30/2022 1158   TCO2 23 06/30/2022 1158   ACIDBASEDEF 4.0 (H) 06/30/2022 1158   O2SAT 89 06/30/2022 1158     Coagulation Profile: Recent Labs  Lab 06/21/2022 1353 06/30/22 1225 07/01/22 0509  INR 3.7* 3.0* 3.0*    Cardiac Enzymes: Recent Labs  Lab 06/30/2022 1353  CKTOTAL 391    HbA1C: No results found for: "HGBA1C"  CBG: Recent Labs  Lab 06/30/22 1115 06/30/22 1523 06/30/22 1923 06/30/22 2302 07/01/22 0317  GLUCAP 109* 97 109* 138* 137*     Critical care time: 70 minutes       Kennieth Rad, MSN, AG-ACNP-BC [facility] 07/01/2022, 7:30 AM  See Amion for pager If no response to pager, please call PCCM consult pager After 7:00 pm call Elink

## 2022-07-01 NOTE — Progress Notes (Signed)
Asked to call and speak with Pts other son, Barnet Pall.  Spoke with Barnet Pall by phone. All questions answered.  Cherly Beach will remain point family person.        Kennieth Rad, MSN, AG-ACNP-BC [facility] 07/01/2022, 4:31 PM  See Amion for pager If no response to pager, please call PCCM consult pager After 7:00 pm call Elink

## 2022-07-01 NOTE — Progress Notes (Signed)
Subjective:  Intubated, sedated   Antibiotics:  Anti-infectives (From admission, onward)    Start     Dose/Rate Route Frequency Ordered Stop   07/01/22 1500  vancomycin (VANCOCIN) IVPB 1000 mg/200 mL premix  Status:  Discontinued        1,000 mg 200 mL/hr over 60 Minutes Intravenous Every 48 hours 07/11/2022 1455 07/13/2022 1635   06/30/22 1200  vancomycin (VANCOREADY) IVPB 1250 mg/250 mL        1,250 mg 166.7 mL/hr over 90 Minutes Intravenous Every 24 hours 06/30/22 1114     06/30/22 1045  linezolid (ZYVOX) IVPB 600 mg  Status:  Discontinued        600 mg 300 mL/hr over 60 Minutes Intravenous Every 12 hours 06/30/22 0949 06/30/22 0957   07/13/2022 1634  vancomycin variable dose per unstable renal function (pharmacist dosing)  Status:  Discontinued         Does not apply See admin instructions 06/26/2022 1635 06/30/22 0949   07/17/2022 1530  metroNIDAZOLE (FLAGYL) IVPB 500 mg        500 mg 100 mL/hr over 60 Minutes Intravenous  Once 07/09/2022 1528 06/24/2022 1925   07/10/2022 1445  vancomycin (VANCOCIN) IVPB 1000 mg/200 mL premix  Status:  Discontinued       See Hyperspace for full Linked Orders Report.   1,000 mg 200 mL/hr over 60 Minutes Intravenous  Once 07/17/2022 1432 06/30/22 1114   07/09/2022 1445  vancomycin (VANCOCIN) IVPB 1000 mg/200 mL premix       See Hyperspace for full Linked Orders Report.   1,000 mg 200 mL/hr over 60 Minutes Intravenous  Once 07/16/2022 1432 07/06/2022 1828   07/14/2022 1445  ceFEPIme (MAXIPIME) 2 g in sodium chloride 0.9 % 100 mL IVPB  Status:  Discontinued        2 g 200 mL/hr over 30 Minutes Intravenous Every 24 hours 06/19/2022 1432 06/30/22 0820       Medications: Scheduled Meds:  amiodarone  150 mg Intravenous Once   Chlorhexidine Gluconate Cloth  6 each Topical Daily   darbepoetin (ARANESP) injection - NON-DIALYSIS  200 mcg Subcutaneous Once   docusate  100 mg Per Tube BID   EPINEPHrine       feeding supplement (PROSource TF20)  60 mL Per Tube  TID   feeding supplement (VITAL 1.5 CAL)  1,000 mL Per Tube [id number]   folic acid  1 mg Per Tube Daily   lactulose  30 g Per Tube TID   leptospermum manuka honey  1 Application Topical Daily   multivitamin  1 tablet Per Tube QHS   mouth rinse  15 mL Mouth Rinse Q2H   pantoprazole (PROTONIX) IV  40 mg Intravenous Q12H   polyethylene glycol  17 g Per Tube Daily   thiamine  100 mg Per Tube Daily   Continuous Infusions:   prismasol BGK 4/2.5 400 mL/hr at 07/01/22 1533    prismasol BGK 4/2.5 400 mL/hr at 07/01/22 1531   sodium chloride     sodium chloride     amiodarone 30 mg/hr (07/01/22 1600)   dextrose 20 mL/hr at 07/01/22 1600   fentaNYL infusion INTRAVENOUS 200 mcg/hr (07/01/22 1600)   norepinephrine (LEVOPHED) Adult infusion 8 mcg/min (07/01/22 1600)   phenylephrine (NEO-SYNEPHRINE) Adult infusion 400 mcg/min (07/01/22 1600)   prismasol BGK 4/2.5 1,500 mL/hr at 07/01/22 1529   vancomycin Stopped (07/01/22 1301)   vasopressin 0.03 Units/min (07/01/22 1607)   PRN Meds:.Place/Maintain arterial  line **AND** sodium chloride, EPINEPHrine, fentaNYL, heparin, midazolam, ondansetron (ZOFRAN) IV, mouth rinse, sodium chloride    Objective: Weight change:   Intake/Output Summary (Last 24 hours) at 07/01/2022 1611 Last data filed at 07/01/2022 1600 Gross per 24 hour  Intake 4736.21 ml  Output 3163.9 ml  Net 1572.31 ml   Blood pressure (!) 134/58, pulse 72, temperature 99 F (37.2 C), resp. rate (!) 24, height 6' (1.829 m), weight 109.2 kg, SpO2 98 %. Temp:  [98.1 F (36.7 C)-99 F (37.2 C)] 99 F (37.2 C) (03/13 1600) Pulse Rate:  [66-96] 72 (03/13 1600) Resp:  [0-28] 24 (03/13 1600) BP: (98-149)/(46-70) 134/58 (03/13 1600) SpO2:  [94 %-100 %] 98 % (03/13 1600) Arterial Line BP: (101-142)/(46-63) 121/53 (03/13 1600) FiO2 (%):  [40 %-80 %] 50 % (03/13 1459) Weight:  [109.2 kg] 109.2 kg (03/13 0500)  Physical Exam: Physical Exam Vitals reviewed.  Constitutional:      Appearance:  He is ill-appearing.     Interventions: He is intubated.  HENT:     Head: Normocephalic and atraumatic.  Cardiovascular:     Rate and Rhythm: Regular rhythm. Tachycardia present.  Pulmonary:     Effort: He is intubated.  Abdominal:     General: There is no distension.  Musculoskeletal:        General: Swelling present.  Skin:    General: Skin is warm and dry.  Neurological:     General: No focal deficit present.     Knee effusion  CBC:    BMET Recent Labs    06/30/22 2155 07/01/22 0509 07/01/22 0751 07/01/22 1122  NA 126* 128* 143 131*  K 5.5* 5.0 4.8 4.6  CL 92* 96*  --   --   CO2 23 23  --   --   GLUCOSE 130* 127*  --   --   BUN 56* 48*  --   --   CREATININE 4.58* 3.92*  --   --   CALCIUM 7.0* 7.1*  --   --      Liver Panel  Recent Labs    06/30/22 0727 06/30/22 1015 07/01/22 0509  PROT 5.3* 5.3* 5.8*  ALBUMIN 2.1* 2.1* 2.6*  2.6*  AST 136* 145* 145*  ALT 36 39 39  ALKPHOS 274* 305* 283*  BILITOT 10.0* 9.9* 12.5*  BILIDIR 5.1*  --  6.0*  IBILI 4.9*  --  6.5*       Sedimentation Rate No results for input(s): "ESRSEDRATE" in the last 72 hours. C-Reactive Protein No results for input(s): "CRP" in the last 72 hours.  Micro Results: Recent Results (from the past 720 hour(s))  Culture, blood (routine x 2)     Status: Abnormal (Preliminary result)   Collection Time: 07/10/2022  2:22 PM   Specimen: BLOOD  Result Value Ref Range Status   Specimen Description BLOOD SITE NOT SPECIFIED  Final   Special Requests   Final    BOTTLES DRAWN AEROBIC AND ANAEROBIC Blood Culture results may not be optimal due to an inadequate volume of blood received in culture bottles   Culture  Setup Time   Final    GRAM POSITIVE COCCI IN CLUSTERS IN BOTH AEROBIC AND ANAEROBIC BOTTLES CRITICAL RESULT CALLED TO, READ BACK BY AND VERIFIED WITH: PHARMD G ABBOTT 06/30/22 @ 0510 BY AB    Culture (A)  Final    STAPHYLOCOCCUS AUREUS SUSCEPTIBILITIES TO FOLLOW Performed at  [facility], [place] [address] [place], [place] [address]  Report Status PENDING  Incomplete  Blood Culture ID Panel (Reflexed)     Status: Abnormal   Collection Time: 07/01/2022  2:22 PM  Result Value Ref Range Status   Enterococcus faecalis NOT DETECTED NOT DETECTED Final   Enterococcus Faecium NOT DETECTED NOT DETECTED Final   Listeria monocytogenes NOT DETECTED NOT DETECTED Final   Staphylococcus species DETECTED (A) NOT DETECTED Final    Comment: CRITICAL RESULT CALLED TO, READ BACK BY AND VERIFIED WITH: PHARMD G ABBOTT 06/30/22 @ 0510 BY AB    Staphylococcus aureus (BCID) DETECTED (A) NOT DETECTED Final    Comment: Methicillin (oxacillin)-resistant Staphylococcus aureus (MRSA). MRSA is predictably resistant to beta-lactam antibiotics (except ceftaroline). Preferred therapy is vancomycin unless clinically contraindicated. Patient requires contact precautions if  hospitalized. CRITICAL RESULT CALLED TO, READ BACK BY AND VERIFIED WITH: PHARMD G ABBOTT 06/30/22 @ 0510 BY AB    Staphylococcus epidermidis NOT DETECTED NOT DETECTED Final   Staphylococcus lugdunensis NOT DETECTED NOT DETECTED Final   Streptococcus species NOT DETECTED NOT DETECTED Final   Streptococcus agalactiae NOT DETECTED NOT DETECTED Final   Streptococcus pneumoniae NOT DETECTED NOT DETECTED Final   Streptococcus pyogenes NOT DETECTED NOT DETECTED Final   A.calcoaceticus-baumannii NOT DETECTED NOT DETECTED Final   Bacteroides fragilis NOT DETECTED NOT DETECTED Final   Enterobacterales NOT DETECTED NOT DETECTED Final   Enterobacter cloacae complex NOT DETECTED NOT DETECTED Final   Escherichia coli NOT DETECTED NOT DETECTED Final   Klebsiella aerogenes NOT DETECTED NOT DETECTED Final   Klebsiella oxytoca NOT DETECTED NOT DETECTED Final   Klebsiella pneumoniae NOT DETECTED NOT DETECTED Final   Proteus species NOT DETECTED NOT DETECTED Final   Salmonella species NOT DETECTED NOT DETECTED Final   Serratia  marcescens NOT DETECTED NOT DETECTED Final   Haemophilus influenzae NOT DETECTED NOT DETECTED Final   Neisseria meningitidis NOT DETECTED NOT DETECTED Final   Pseudomonas aeruginosa NOT DETECTED NOT DETECTED Final   Stenotrophomonas maltophilia NOT DETECTED NOT DETECTED Final   Candida albicans NOT DETECTED NOT DETECTED Final   Candida auris NOT DETECTED NOT DETECTED Final   Candida glabrata NOT DETECTED NOT DETECTED Final   Candida krusei NOT DETECTED NOT DETECTED Final   Candida parapsilosis NOT DETECTED NOT DETECTED Final   Candida tropicalis NOT DETECTED NOT DETECTED Final   Cryptococcus neoformans/gattii NOT DETECTED NOT DETECTED Final   Meth resistant mecA/C and MREJ DETECTED (A) NOT DETECTED Final    Comment: CRITICAL RESULT CALLED TO, READ BACK BY AND VERIFIED WITH: PHARMD G ABBOTT 06/30/22 @ 0510 BY AB Performed at [facility], [address] [place], [place] [address]   Resp panel by RT-PCR (RSV, Flu A&B, Covid) Anterior Nasal Swab     Status: None   Collection Time: 07/19/2022  2:27 PM   Specimen: Anterior Nasal Swab  Result Value Ref Range Status   SARS Coronavirus 2 by RT PCR NEGATIVE NEGATIVE Final   Influenza A by PCR NEGATIVE NEGATIVE Final   Influenza B by PCR NEGATIVE NEGATIVE Final    Comment: (NOTE) The Xpert Xpress SARS-CoV-2/FLU/RSV plus assay is intended as an aid in the diagnosis of influenza from Nasopharyngeal swab specimens and should not be used as a sole basis for treatment. Nasal washings and aspirates are unacceptable for Xpert Xpress SARS-CoV-2/FLU/RSV testing.  Fact Sheet for Patients: EntrepreneurPulse.com.au  Fact Sheet for Healthcare Providers: IncredibleEmployment.be  This test is not yet approved or cleared by the [place] FDA and has been authorized for detection and/or diagnosis  of SARS-CoV-2 by FDA under an Emergency Use Authorization (EUA). This EUA will remain in effect (meaning this test  can be used) for the duration of the COVID-19 declaration under Section 564(b)(1) of the Act, 21 U.S.C. section 360bbb-3(b)(1), unless the authorization is terminated or revoked.     Resp Syncytial Virus by PCR NEGATIVE NEGATIVE Final    Comment: (NOTE) Fact Sheet for Patients: EntrepreneurPulse.com.au  Fact Sheet for Healthcare Providers: IncredibleEmployment.be  This test is not yet approved or cleared by the [place] FDA and has been authorized for detection and/or diagnosis of SARS-CoV-2 by FDA under an Emergency Use Authorization (EUA). This EUA will remain in effect (meaning this test can be used) for the duration of the COVID-19 declaration under Section 564(b)(1) of the Act, 21 U.S.C. section 360bbb-3(b)(1), unless the authorization is terminated or revoked.  Performed at [facility], [place] [address] [place], [place] [address]   Culture, blood (routine x 2)     Status: Abnormal (Preliminary result)   Collection Time: 07/15/2022  4:57 PM   Specimen: BLOOD RIGHT HAND  Result Value Ref Range Status   Specimen Description BLOOD RIGHT HAND  Final   Special Requests   Final    BOTTLES DRAWN AEROBIC AND ANAEROBIC Blood Culture results may not be optimal due to an inadequate volume of blood received in culture bottles   Culture  Setup Time   Final    GRAM POSITIVE COCCI IN CLUSTERS IN BOTH AEROBIC AND ANAEROBIC BOTTLES CRITICAL VALUE NOTED.  VALUE IS CONSISTENT WITH PREVIOUSLY REPORTED AND CALLED VALUE. Performed at [facility], [place] [address] [place], [place] [address]    Culture STAPHYLOCOCCUS AUREUS (A)  Final   Report Status PENDING  Incomplete  Culture, blood (Routine X 2) w Reflex to ID Panel     Status: None (Preliminary result)   Collection Time: 06/30/22  7:27 AM   Specimen: BLOOD  Result Value Ref Range Status   Specimen Description BLOOD RIGHT ANTECUBITAL  Final   Special Requests   Final    BOTTLES DRAWN  AEROBIC AND ANAEROBIC Blood Culture adequate volume   Culture   Final    NO GROWTH < 24 HOURS Performed at [facility], [place] [address], [place], [place] [address]    Report Status PENDING  Incomplete  Culture, blood (Routine X 2) w Reflex to ID Panel     Status: None (Preliminary result)   Collection Time: 06/30/22  7:29 AM   Specimen: BLOOD RIGHT HAND  Result Value Ref Range Status   Specimen Description BLOOD RIGHT HAND  Final   Special Requests   Final    BOTTLES DRAWN AEROBIC AND ANAEROBIC Blood Culture adequate volume   Culture  Setup Time   Final    GRAM POSITIVE COCCI IN CLUSTERS ANAEROBIC BOTTLE ONLY CRITICAL VALUE NOTED.  VALUE IS CONSISTENT WITH PREVIOUSLY REPORTED AND CALLED VALUE.    Culture   Final    NO GROWTH < 24 HOURS Performed at [facility], [place] [address] [place], [place] [address]    Report Status PENDING  Incomplete  Culture, Respiratory w Gram Stain     Status: None (Preliminary result)   Collection Time: 06/30/22  1:33 PM   Specimen: Tracheal Aspirate; Respiratory  Result Value Ref Range Status   Specimen Description TRACHEAL ASPIRATE  Final   Special Requests NONE  Final   Gram Stain   Final    RARE WBC PRESENT, PREDOMINANTLY  MONONUCLEAR RARE GRAM POSITIVE COCCI Performed at [facility], [place] [address] [place], [place] [address]    Culture RARE STAPHYLOCOCCUS AUREUS  Final   Report Status PENDING  Incomplete    Studies/Results: DG Chest Port 1 View  Result Date: 07/01/2022 CLINICAL DATA:  Intubation, sepsis, respiratory failure EXAM: PORTABLE CHEST 1 VIEW COMPARISON:  Portable exam 0525 hours compared to 06/30/2022 FINDINGS: Tip of endotracheal tube projects 3.0 cm above carina. Nasogastric tube coiled in proximal stomach. BILATERAL jugular lines with tips projecting over SVC. Normal heart size and mediastinal contours. Patchy BILATERAL airspace infiltrates again seen. No pleural effusion or pneumothorax. IMPRESSION: Persistent  BILATERAL pulmonary infiltrates. Electronically Signed   By: Lavonia Dana M.D.   On: 07/01/2022 07:40   DG CHEST PORT 1 VIEW  Result Date: 06/30/2022 CLINICAL DATA:  Line placement for dialysis, intubation EXAM: PORTABLE CHEST 1 VIEW COMPARISON:  Portable exam 1240 hours compared to 0913 hours FINDINGS: Tip of endotracheal tube projects 2.5 cm above carina. Nasogastric tube coiled in proximal stomach. LEFT jugular line tip projects over SVC. New dual lumen RIGHT jugular central venous catheter with tip projecting over SVC. Enlargement of cardiac silhouette with vascular congestion. BILATERAL pulmonary infiltrates favor pulmonary edema. No pleural effusion or pneumothorax. IMPRESSION: No pneumothorax following RIGHT jugular line placement. Enlargement of cardiac silhouette with pulmonary vascular congestion and diffuse infiltrates most consistent with pulmonary edema. Electronically Signed   By: Lavonia Dana M.D.   On: 06/30/2022 12:54   ECHOCARDIOGRAM COMPLETE  Result Date: 06/30/2022    ECHOCARDIOGRAM REPORT   Patient Name:   JOSEDAVID RAMSBURG Date of Exam: 06/30/2022 Medical Rec #:  [id number]       Height:       72.0 in Accession #:    [id number]      Weight:       233.7 lb Date of Birth:  1960/07/13       BSA:          2.276 m Patient Age:    62 years        BP:           115/50 mmHg Patient Gender: M               HR:           86 bpm. Exam Location:  Inpatient Procedure: 2D Echo, Cardiac Doppler and Color Doppler STAT ECHO Indications:    Bacteremia. Shock  History:        Patient has prior history of Echocardiogram examinations, most                 recent 08/27/2021. Abnormal ECG, Arrythmias:Atrial Fibrillation;                 Signs/Symptoms:Bacteremia.  Sonographer:    Roseanna Rainbow RDCS Referring Phys: [id number] PAULA B SIMPSON  Sonographer Comments: Technically difficult study due to poor echo windows and echo performed with patient supine and on artificial respirator. STAT echo for shock. Study rushed for  central line placement. IMPRESSIONS  1. Left ventricular ejection fraction, by estimation, is 70 to 75%. The left ventricle has hyperdynamic function. The left ventricle has no regional wall motion abnormalities. Left ventricular diastolic parameters are indeterminate.  2. Right ventricular systolic function is hyperdynamic. The right ventricular size is mildly enlarged. There is moderately elevated pulmonary artery systolic pressure.  3. The mitral valve is normal in structure. No evidence of mitral valve regurgitation. No evidence of mitral stenosis.  4. The aortic valve is tricuspid. There is mild calcification of the aortic valve. Aortic valve regurgitation is trivial.  5. The inferior vena cava is dilated in size with <50% respiratory variability, suggesting right atrial pressure of 15 mmHg.  6. Elvated aortic flows reflect hyperdynamic state rather that aortic stenosis. Conclusion(s)/Recommendation(s): No evidence of valvular vegetations on this transthoracic echocardiogram. Consider a transesophageal echocardiogram to exclude infective endocarditis if clinically indicated. FINDINGS  Left Ventricle: Left ventricular ejection fraction, by estimation, is 70 to 75%. The left ventricle has hyperdynamic function. The left ventricle has no regional wall motion abnormalities. The left ventricular internal cavity size was normal in size. There is no left ventricular hypertrophy. Left ventricular diastolic parameters are indeterminate. Right Ventricle: The right ventricular size is mildly enlarged. No increase in right ventricular wall thickness. Right ventricular systolic function is hyperdynamic. There is moderately elevated pulmonary artery systolic pressure. The tricuspid regurgitant velocity is 3.24 m/s, and with an assumed right atrial pressure of 15 mmHg, the estimated right ventricular systolic pressure is 0000000 mmHg. Left Atrium: Left atrial size was normal in size. Right Atrium: Right atrial size was normal in  size. Pericardium: There is no evidence of pericardial effusion. Mitral Valve: The mitral valve is normal in structure. No evidence of mitral valve regurgitation. No evidence of mitral valve stenosis. Tricuspid Valve: The tricuspid valve is normal in structure. Tricuspid valve regurgitation is trivial. No evidence of tricuspid stenosis. Aortic Valve: The aortic valve is tricuspid. There is mild calcification of the aortic valve. There is mild aortic valve annular calcification. Aortic valve regurgitation is trivial. Aortic valve mean gradient measures 17.0 mmHg. Aortic valve peak gradient measures 31.8 mmHg. Aortic valve area, by VTI measures 4.06 cm. Pulmonic Valve: The pulmonic valve was normal in structure. Pulmonic valve regurgitation is not visualized. No evidence of pulmonic stenosis. Aorta: The aortic root is normal in size and structure. Venous: The inferior vena cava is dilated in size with less than 50% respiratory variability, suggesting right atrial pressure of 15 mmHg. IAS/Shunts: The interatrial septum was not well visualized.  LEFT VENTRICLE PLAX 2D LVIDd:         5.60 cm LVIDs:         3.60 cm LV PW:         0.90 cm LV IVS:        0.90 cm LVOT diam:     2.60 cm LV SV:         153 LV SV Index:   67 LVOT Area:     5.31 cm  RIGHT VENTRICLE             IVC RV S prime:     17.20 cm/s  IVC diam: 2.60 cm TAPSE (M-mode): 2.4 cm LEFT ATRIUM           Index        RIGHT ATRIUM           Index LA diam:      3.60 cm 1.58 cm/m   RA Area:     18.40 cm LA Vol (A2C): 52.1 ml 22.89 ml/m  RA Volume:   49.40 ml  21.70 ml/m LA Vol (A4C): 48.4 ml 21.26 ml/m  AORTIC VALVE AV Area (Vmax):    3.22 cm AV Area (Vmean):   3.70 cm AV Area (VTI):     4.06 cm AV Vmax:           282.00 cm/s AV Vmean:  172.000 cm/s AV VTI:            0.377 m AV Peak Grad:      31.8 mmHg AV Mean Grad:      17.0 mmHg LVOT Vmax:         171.00 cm/s LVOT Vmean:        120.000 cm/s LVOT VTI:          0.288 m LVOT/AV VTI ratio: 0.76   AORTA Ao Root diam: 3.40 cm MITRAL VALVE                TRICUSPID VALVE MV Area (PHT): 4.09 cm     TR Peak grad:   42.0 mmHg MV Decel Time: 186 msec     TR Vmax:        324.00 cm/s MV E velocity: 135.67 cm/s MV A velocity: 91.80 cm/s   SHUNTS MV E/A ratio:  1.48         Systemic VTI:  0.29 m                             Systemic Diam: 2.60 cm Rudean Haskell MD Electronically signed by Rudean Haskell MD Signature Date/Time: 06/30/2022/12:05:25 PM    Final    DG Tibia/Fibula Right Port  Result Date: 06/30/2022 CLINICAL DATA:  Pain and swelling of the right lower extremity. EXAM: PORTABLE RIGHT TIBIA AND FIBULA - 2 VIEW COMPARISON:  Knee radiographs 09/27/2020 FINDINGS: Significant and markedly progressive knee joint degenerative changes. There also is a large knee joint effusion. This could be a progressive inflammatory/erosive arthropathy but could not exclude septic arthritis. Recommend correlation with clinical findings and joint aspiration as indicated. The tibia and fibula are intact. No destructive bony changes. Scattered vascular calcifications. The ankle joint is maintained. IMPRESSION: 1. Significant and markedly progressive knee joint degenerative changes and large knee joint effusion. Progressive inflammatory arthropathy versus septic arthritis. Recommend correlation with clinical findings and joint aspiration as indicated. 2. Intact tibia and fibula.  No destructive bony changes. Electronically Signed   By: Marijo Sanes M.D.   On: 06/30/2022 09:48   DG Chest Port 1 View  Result Date: 06/30/2022 CLINICAL DATA:  Central line placement. EXAM: PORTABLE CHEST 1 VIEW COMPARISON:  06/30/2022, earlier same day FINDINGS: 0913 hours. Endotracheal tube tip is approximately 3.9 cm above the base of the carina. NG tube tip is in the stomach. Left IJ central line tip projects in the region of the innominate vein confluence and may be tenting the lateral wall of the vessel given the laterally directed  position of the tip. Lung volumes remain low with right greater than left patchy diffuse airspace disease. The cardio pericardial silhouette is enlarged. Telemetry leads overlie the chest. IMPRESSION: Left IJ central line tip projects in the region of the innominate vein confluence and may be tenting the lateral wall of the vessel given the laterally directed position of the tip. No evidence for pneumothorax. Similar appearance of the right greater than left airspace disease. Electronically Signed   By: Misty Stanley M.D.   On: 06/30/2022 09:35   DG Chest Port 1 View  Result Date: 06/30/2022 CLINICAL DATA:  Possible sepsis, rule out aspiration. EXAM: PORTABLE CHEST 1 VIEW COMPARISON:  06/28/2022. FINDINGS: 0533 hours. Developing consolidation in the medial right lower lobe with patchy opacities throughout the right lung, favored to represent aspiration or multifocal pneumonia. New trace right pleural effusion. No pneumothorax. Stable cardiac and  mediastinal contours, accounting for patient rotation. IMPRESSION: Developing consolidation in the medial right lower lobe with patchy opacities throughout the right lung, favored to represent aspiration or multifocal pneumonia. New trace right pleural effusion. Electronically Signed   By: Emmit Alexanders M.D.   On: 06/30/2022 08:16   [place] EKG SITE RITE  Result Date: 07/02/2022 If Site Rite image not attached, placement could not be confirmed due to current cardiac rhythm.  CT ABDOMEN PELVIS WO CONTRAST  Result Date: 06/23/2022 CLINICAL DATA:  Acute abdominal pain EXAM: CT ABDOMEN AND PELVIS WITHOUT CONTRAST TECHNIQUE: Multidetector CT imaging of the abdomen and pelvis was performed following the standard protocol without IV contrast. RADIATION DOSE REDUCTION: This exam was performed according to the departmental dose-optimization program which includes automated exposure control, adjustment of the mA and/or kV according to patient size and/or use of iterative  reconstruction technique. COMPARISON:  03/15/2021 FINDINGS: Lower chest: Blood pool is hypodense compared to the interventricular septum consistent with anemia. Scattered coronary calcifications. Trace pleural fluid. Scattered airspace opacities in the lung bases right greater than left, new since prior. Hepatobiliary: Interval TIPS. Small partially calcified stones layer in the dependent aspect of the gallbladder which is nondilated. No definite liver lesion or biliary ductal dilatation. Pancreas: Unremarkable. No pancreatic ductal dilatation or surrounding inflammatory changes. Spleen: Normal in size without focal abnormality. Adrenals/Urinary Tract: No adrenal mass. No urolithiasis or hydronephrosis. Symmetric renal contours. Urinary bladder decompressed by Foley catheter. Stomach/Bowel: Stomach is nondistended, unremarkable. Small bowel decompressed. Normal appendix. Colon is partially distended by gas and fecal material. Vascular/Lymphatic: Scattered aortoiliac calcified atheromatous plaque without aneurysm. TIPS projects in expected location. No abdominal or pelvic adenopathy. Reproductive: Prostate is unremarkable. Other: Trace pelvic fluid.  No abdominal ascites.  No free air. Musculoskeletal: Spondylitic changes throughout the visualized lower thoracic and lumbosacral spine as before. No fracture or other acute finding. IMPRESSION: 1. Scattered airspace opacities in the lung bases right greater than left, new since prior. 2. Interval TIPS, resolution of abdominal ascites. 3. Cholelithiasis. 4. Coronary and aortic Atherosclerosis (ICD10-I70.0). Electronically Signed   By: Lucrezia Europe M.D.   On: 07/19/2022 16:26   CT Head Wo Contrast  Result Date: 06/26/2022 CLINICAL DATA:  Provided history: Mental status change, unknown cause. EXAM: CT HEAD WITHOUT CONTRAST TECHNIQUE: Contiguous axial images were obtained from the base of the skull through the vertex without intravenous contrast. RADIATION DOSE REDUCTION:  This exam was performed according to the departmental dose-optimization program which includes automated exposure control, adjustment of the mA and/or kV according to patient size and/or use of iterative reconstruction technique. COMPARISON:  No pertinent prior exams available for comparison. FINDINGS: Brain: No age advanced or lobar predominant parenchymal atrophy. There is no acute intracranial hemorrhage. No demarcated cortical infarct. No extra-axial fluid collection. No evidence of an intracranial mass. No midline shift. Vascular: No hyperdense vessel. Skull: No mass or acute finding within the imaged orbits. Sinuses/Orbits: No mass or acute finding within the imaged orbits. Mild mucosal thickening within the bilateral maxillary, sphenoid and ethmoid sinuses. IMPRESSION: 1.  No evidence of an acute intracranial abnormality. 2. Mild paranasal sinus mucosal thickening. Electronically Signed   By: Kellie Simmering D.O.   On: 06/20/2022 16:23      Assessment/Plan:  INTERVAL HISTORY:   Patient underwent cardioversion   Principal Problem:   MRSA bacteremia Active Problems:   Acute metabolic encephalopathy   Septic arthritis ([place])   Septic shock ([facility])   Chronic venous hypertension (idiopathic) with ulcer and inflammation  of right lower extremity ([place])    SATCHEL GRENDA is a 62 y.o. male with history of alcoholic cirrhosis prior group A streptococcal septic joint status post I&D, history of TIPS with recurrent large-volume ascites now admitted with hypothermia and septic shock due to MRSA.  He has a large effusion on the right knee which I suspect is the source of his bacteremia.  The ulceration in the right leg I agree is not terribly concerning and looks chronic in nature.  He is critically ill on CRRT multiple pressors now status post cardioversion for PSVT  Echocardiogram transthoracic 1 does not show evidence of endocarditis.  I do not think he would be a good candidate for TEE.  I  remain concerned about the right knee and I think sending an aspirate for cell count and differential will help see determine if this is indeed a septic knee as a suspect.  At some point if he survives he will need a line holiday and blood cultures taken after all central lines are removed.  I spent 54 minutes with the patient including than 50% of the time in face to face and non face to face time with the patient along with review of medical records in preparation for the visit and during the visit and in coordination of his care.      LOS: 2 days   Alcide Evener 07/01/2022, 4:11 PM

## 2022-07-01 NOTE — Progress Notes (Signed)
Subjective:  unstable overnight, had some VT-  on amio-  ended up positive fluid wise overnight -  did have 200 of UOP  Objective Vital signs in last 24 hours: Vitals:   07/01/22 0600 07/01/22 0700 07/01/22 0756 07/01/22 0800  BP: 130/60 (!) 135/58  (!) 129/52  Pulse: 70 72 69 71  Resp: (!) 9 (!) 6 (!) 21 16  Temp: 98.6 F (37 C) 98.6 F (37 C) 98.4 F (36.9 C) 98.4 F (36.9 C)  TempSrc:      SpO2: 98% 99% 97% 97%  Weight:      Height:       Weight change:   Intake/Output Summary (Last 24 hours) at 07/01/2022 0831 Last data filed at 07/01/2022 0800 Gross per 24 hour  Intake 4533.71 ml  Output 1720.9 ml  Net 2812.81 ml     Assessment/Plan: 62 year old WM with cirrhosis but well compensated-  found down-  drugs in system as well as bacteremia thought due to leg wound- sepsis and shock 1.Renal- baseline crt 0.9 in December of 23. Now with AKI in the setting of septic shock due to bacteremia- likely ATN-  also pre admit mobic.  BP suppoorted with pressors and bacteremia being treated.  However, renal function worsening and has developed pretty significant hyperkalemia with metabolic acidosis.  Decision is made to support with CRRT - started 3/12 -   2 k bath, no heparin and no volume removal at first-  now attempting UF as able.  2. Hypertension/volume  - hypotensive req pressors-  mildly fluid overloaded-  no UF at first with CRRT-  now positive-  will try to UF as able-  order is for 50-100 per hour 3. Hyperkalemia-  has been medically treated-  then CRRT-  2 K bath-  improving-  keep 2 k bath for now but likely will need to change to some 4 k with PM labs 4. Anemia  - supportive care for now but I will also add ESA 5. Hypocalcemia-  s/p iv dosing today as well -  corrected calcium is better  6. Acidosis- CRRT will help correct- is doing    Louis Meckel    Labs: Basic Metabolic Panel: Recent Labs  Lab 06/30/22 1015 06/30/22 1158 06/30/22 1653 06/30/22 2155  07/01/22 0509 07/01/22 0751  NA 127*   < >  --  126* 128* 143  K 5.6*   < >  --  5.5* 5.0 4.8  CL 95*  --   --  92* 96*  --   CO2 20*  --   --  23 23  --   GLUCOSE 60*  --   --  130* 127*  --   BUN 66*  --   --  56* 48*  --   CREATININE 5.91*  --   --  4.58* 3.92*  --   CALCIUM 6.8*  --   --  7.0* 7.1*  --   PHOS 6.0*  --  7.0*  --  5.8*  5.8*  --    < > = values in this interval not displayed.   Liver Function Tests: Recent Labs  Lab 07/06/2022 1353 06/30/22 0727 06/30/22 1015 07/01/22 0509  AST 93* 136* 145*  --   ALT 23 36 39  --   ALKPHOS 217* 274* 305*  --   BILITOT 7.9* 10.0* 9.9*  --   PROT 5.1* 5.3* 5.3*  --   ALBUMIN 1.9* 2.1* 2.1* 2.6*  No results for input(s): "LIPASE", "AMYLASE" in the last 168 hours. Recent Labs  Lab 06/20/2022 1353 06/30/22 1245  AMMONIA 74* 65*   CBC: Recent Labs  Lab 07/10/2022 1353 07/08/2022 1409 06/30/22 0358 06/30/22 0946 06/30/22 1158 06/30/22 1225 07/01/22 0509 07/01/22 0751  WBC 20.2*  --  36.8*  --   --   --  30.6*  --   NEUTROABS 18.6*  --   --   --   --   --   --   --   HGB 7.9*   < > 7.6*   < > 8.5*  --  7.5* 7.5*  HCT 23.8*   < > 21.7*   < > 25.0*  --  22.4* 22.0*  MCV 108.2*  --  103.3*  --   --   --  106.7*  --   PLT 40*  --  32*  --   --  59* 32*  --    < > = values in this interval not displayed.   Cardiac Enzymes: Recent Labs  Lab 07/05/2022 1353  CKTOTAL 391   CBG: Recent Labs  Lab 06/30/22 1523 06/30/22 1923 06/30/22 2302 07/01/22 0317 07/01/22 0748  GLUCAP 97 109* 138* 137* 90    Iron Studies: No results for input(s): "IRON", "TIBC", "TRANSFERRIN", "FERRITIN" in the last 72 hours. Studies/Results: DG Chest Port 1 View  Result Date: 07/01/2022 CLINICAL DATA:  Intubation, sepsis, respiratory failure EXAM: PORTABLE CHEST 1 VIEW COMPARISON:  Portable exam 0525 hours compared to 06/30/2022 FINDINGS: Tip of endotracheal tube projects 3.0 cm above carina. Nasogastric tube coiled in proximal stomach.  BILATERAL jugular lines with tips projecting over SVC. Normal heart size and mediastinal contours. Patchy BILATERAL airspace infiltrates again seen. No pleural effusion or pneumothorax. IMPRESSION: Persistent BILATERAL pulmonary infiltrates. Electronically Signed   By: Lavonia Dana M.D.   On: 07/01/2022 07:40   DG CHEST PORT 1 VIEW  Result Date: 06/30/2022 CLINICAL DATA:  Line placement for dialysis, intubation EXAM: PORTABLE CHEST 1 VIEW COMPARISON:  Portable exam 1240 hours compared to 0913 hours FINDINGS: Tip of endotracheal tube projects 2.5 cm above carina. Nasogastric tube coiled in proximal stomach. LEFT jugular line tip projects over SVC. New dual lumen RIGHT jugular central venous catheter with tip projecting over SVC. Enlargement of cardiac silhouette with vascular congestion. BILATERAL pulmonary infiltrates favor pulmonary edema. No pleural effusion or pneumothorax. IMPRESSION: No pneumothorax following RIGHT jugular line placement. Enlargement of cardiac silhouette with pulmonary vascular congestion and diffuse infiltrates most consistent with pulmonary edema. Electronically Signed   By: Lavonia Dana M.D.   On: 06/30/2022 12:54   ECHOCARDIOGRAM COMPLETE  Result Date: 06/30/2022    ECHOCARDIOGRAM REPORT   Patient Name:   Kyle Pitts Date of Exam: 06/30/2022 Medical Rec #:  [id number]       Height:       72.0 in Accession #:    [id number]      Weight:       233.7 lb Date of Birth:  03-13-1961       BSA:          2.276 m Patient Age:    62 years        BP:           115/50 mmHg Patient Gender: M               HR:           86 bpm. Exam Location:  Inpatient Procedure: 2D  Echo, Cardiac Doppler and Color Doppler STAT ECHO Indications:    Bacteremia. Shock  History:        Patient has prior history of Echocardiogram examinations, most                 recent 08/27/2021. Abnormal ECG, Arrythmias:Atrial Fibrillation;                 Signs/Symptoms:Bacteremia.  Sonographer:    Roseanna Rainbow RDCS Referring Phys:  [id number] PAULA B SIMPSON  Sonographer Comments: Technically difficult study due to poor echo windows and echo performed with patient supine and on artificial respirator. STAT echo for shock. Study rushed for central line placement. IMPRESSIONS  1. Left ventricular ejection fraction, by estimation, is 70 to 75%. The left ventricle has hyperdynamic function. The left ventricle has no regional wall motion abnormalities. Left ventricular diastolic parameters are indeterminate.  2. Right ventricular systolic function is hyperdynamic. The right ventricular size is mildly enlarged. There is moderately elevated pulmonary artery systolic pressure.  3. The mitral valve is normal in structure. No evidence of mitral valve regurgitation. No evidence of mitral stenosis.  4. The aortic valve is tricuspid. There is mild calcification of the aortic valve. Aortic valve regurgitation is trivial.  5. The inferior vena cava is dilated in size with <50% respiratory variability, suggesting right atrial pressure of 15 mmHg.  6. Elvated aortic flows reflect hyperdynamic state rather that aortic stenosis. Conclusion(s)/Recommendation(s): No evidence of valvular vegetations on this transthoracic echocardiogram. Consider a transesophageal echocardiogram to exclude infective endocarditis if clinically indicated. FINDINGS  Left Ventricle: Left ventricular ejection fraction, by estimation, is 70 to 75%. The left ventricle has hyperdynamic function. The left ventricle has no regional wall motion abnormalities. The left ventricular internal cavity size was normal in size. There is no left ventricular hypertrophy. Left ventricular diastolic parameters are indeterminate. Right Ventricle: The right ventricular size is mildly enlarged. No increase in right ventricular wall thickness. Right ventricular systolic function is hyperdynamic. There is moderately elevated pulmonary artery systolic pressure. The tricuspid regurgitant velocity is 3.24 m/s, and with  an assumed right atrial pressure of 15 mmHg, the estimated right ventricular systolic pressure is 0000000 mmHg. Left Atrium: Left atrial size was normal in size. Right Atrium: Right atrial size was normal in size. Pericardium: There is no evidence of pericardial effusion. Mitral Valve: The mitral valve is normal in structure. No evidence of mitral valve regurgitation. No evidence of mitral valve stenosis. Tricuspid Valve: The tricuspid valve is normal in structure. Tricuspid valve regurgitation is trivial. No evidence of tricuspid stenosis. Aortic Valve: The aortic valve is tricuspid. There is mild calcification of the aortic valve. There is mild aortic valve annular calcification. Aortic valve regurgitation is trivial. Aortic valve mean gradient measures 17.0 mmHg. Aortic valve peak gradient measures 31.8 mmHg. Aortic valve area, by VTI measures 4.06 cm. Pulmonic Valve: The pulmonic valve was normal in structure. Pulmonic valve regurgitation is not visualized. No evidence of pulmonic stenosis. Aorta: The aortic root is normal in size and structure. Venous: The inferior vena cava is dilated in size with less than 50% respiratory variability, suggesting right atrial pressure of 15 mmHg. IAS/Shunts: The interatrial septum was not well visualized.  LEFT VENTRICLE PLAX 2D LVIDd:         5.60 cm LVIDs:         3.60 cm LV PW:         0.90 cm LV IVS:        0.90 cm  LVOT diam:     2.60 cm LV SV:         153 LV SV Index:   67 LVOT Area:     5.31 cm  RIGHT VENTRICLE             IVC RV S prime:     17.20 cm/s  IVC diam: 2.60 cm TAPSE (M-mode): 2.4 cm LEFT ATRIUM           Index        RIGHT ATRIUM           Index LA diam:      3.60 cm 1.58 cm/m   RA Area:     18.40 cm LA Vol (A2C): 52.1 ml 22.89 ml/m  RA Volume:   49.40 ml  21.70 ml/m LA Vol (A4C): 48.4 ml 21.26 ml/m  AORTIC VALVE AV Area (Vmax):    3.22 cm AV Area (Vmean):   3.70 cm AV Area (VTI):     4.06 cm AV Vmax:           282.00 cm/s AV Vmean:          172.000  cm/s AV VTI:            0.377 m AV Peak Grad:      31.8 mmHg AV Mean Grad:      17.0 mmHg LVOT Vmax:         171.00 cm/s LVOT Vmean:        120.000 cm/s LVOT VTI:          0.288 m LVOT/AV VTI ratio: 0.76  AORTA Ao Root diam: 3.40 cm MITRAL VALVE                TRICUSPID VALVE MV Area (PHT): 4.09 cm     TR Peak grad:   42.0 mmHg MV Decel Time: 186 msec     TR Vmax:        324.00 cm/s MV E velocity: 135.67 cm/s MV A velocity: 91.80 cm/s   SHUNTS MV E/A ratio:  1.48         Systemic VTI:  0.29 m                             Systemic Diam: 2.60 cm Rudean Haskell MD Electronically signed by Rudean Haskell MD Signature Date/Time: 06/30/2022/12:05:25 PM    Final    DG Tibia/Fibula Right Port  Result Date: 06/30/2022 CLINICAL DATA:  Pain and swelling of the right lower extremity. EXAM: PORTABLE RIGHT TIBIA AND FIBULA - 2 VIEW COMPARISON:  Knee radiographs 09/27/2020 FINDINGS: Significant and markedly progressive knee joint degenerative changes. There also is a large knee joint effusion. This could be a progressive inflammatory/erosive arthropathy but could not exclude septic arthritis. Recommend correlation with clinical findings and joint aspiration as indicated. The tibia and fibula are intact. No destructive bony changes. Scattered vascular calcifications. The ankle joint is maintained. IMPRESSION: 1. Significant and markedly progressive knee joint degenerative changes and large knee joint effusion. Progressive inflammatory arthropathy versus septic arthritis. Recommend correlation with clinical findings and joint aspiration as indicated. 2. Intact tibia and fibula.  No destructive bony changes. Electronically Signed   By: Marijo Sanes M.D.   On: 06/30/2022 09:48   DG Chest Port 1 View  Result Date: 06/30/2022 CLINICAL DATA:  Central line placement. EXAM: PORTABLE CHEST 1 VIEW COMPARISON:  06/30/2022, earlier same day FINDINGS: 0913 hours. Endotracheal tube tip  is approximately 3.9 cm above the base  of the carina. NG tube tip is in the stomach. Left IJ central line tip projects in the region of the innominate vein confluence and may be tenting the lateral wall of the vessel given the laterally directed position of the tip. Lung volumes remain low with right greater than left patchy diffuse airspace disease. The cardio pericardial silhouette is enlarged. Telemetry leads overlie the chest. IMPRESSION: Left IJ central line tip projects in the region of the innominate vein confluence and may be tenting the lateral wall of the vessel given the laterally directed position of the tip. No evidence for pneumothorax. Similar appearance of the right greater than left airspace disease. Electronically Signed   By: Misty Stanley M.D.   On: 06/30/2022 09:35   DG Chest Port 1 View  Result Date: 06/30/2022 CLINICAL DATA:  Possible sepsis, rule out aspiration. EXAM: PORTABLE CHEST 1 VIEW COMPARISON:  07/07/2022. FINDINGS: 0533 hours. Developing consolidation in the medial right lower lobe with patchy opacities throughout the right lung, favored to represent aspiration or multifocal pneumonia. New trace right pleural effusion. No pneumothorax. Stable cardiac and mediastinal contours, accounting for patient rotation. IMPRESSION: Developing consolidation in the medial right lower lobe with patchy opacities throughout the right lung, favored to represent aspiration or multifocal pneumonia. New trace right pleural effusion. Electronically Signed   By: Emmit Alexanders M.D.   On: 06/30/2022 08:16   [place] EKG SITE RITE  Result Date: 06/26/2022 If Site Rite image not attached, placement could not be confirmed due to current cardiac rhythm.  CT ABDOMEN PELVIS WO CONTRAST  Result Date: 06/19/2022 CLINICAL DATA:  Acute abdominal pain EXAM: CT ABDOMEN AND PELVIS WITHOUT CONTRAST TECHNIQUE: Multidetector CT imaging of the abdomen and pelvis was performed following the standard protocol without IV contrast. RADIATION DOSE REDUCTION:  This exam was performed according to the departmental dose-optimization program which includes automated exposure control, adjustment of the mA and/or kV according to patient size and/or use of iterative reconstruction technique. COMPARISON:  03/15/2021 FINDINGS: Lower chest: Blood pool is hypodense compared to the interventricular septum consistent with anemia. Scattered coronary calcifications. Trace pleural fluid. Scattered airspace opacities in the lung bases right greater than left, new since prior. Hepatobiliary: Interval TIPS. Small partially calcified stones layer in the dependent aspect of the gallbladder which is nondilated. No definite liver lesion or biliary ductal dilatation. Pancreas: Unremarkable. No pancreatic ductal dilatation or surrounding inflammatory changes. Spleen: Normal in size without focal abnormality. Adrenals/Urinary Tract: No adrenal mass. No urolithiasis or hydronephrosis. Symmetric renal contours. Urinary bladder decompressed by Foley catheter. Stomach/Bowel: Stomach is nondistended, unremarkable. Small bowel decompressed. Normal appendix. Colon is partially distended by gas and fecal material. Vascular/Lymphatic: Scattered aortoiliac calcified atheromatous plaque without aneurysm. TIPS projects in expected location. No abdominal or pelvic adenopathy. Reproductive: Prostate is unremarkable. Other: Trace pelvic fluid.  No abdominal ascites.  No free air. Musculoskeletal: Spondylitic changes throughout the visualized lower thoracic and lumbosacral spine as before. No fracture or other acute finding. IMPRESSION: 1. Scattered airspace opacities in the lung bases right greater than left, new since prior. 2. Interval TIPS, resolution of abdominal ascites. 3. Cholelithiasis. 4. Coronary and aortic Atherosclerosis (ICD10-I70.0). Electronically Signed   By: Lucrezia Europe M.D.   On: 07/07/2022 16:26   CT Head Wo Contrast  Result Date: 07/02/2022 CLINICAL DATA:  Provided history: Mental status  change, unknown cause. EXAM: CT HEAD WITHOUT CONTRAST TECHNIQUE: Contiguous axial images were obtained from the base of the  skull through the vertex without intravenous contrast. RADIATION DOSE REDUCTION: This exam was performed according to the departmental dose-optimization program which includes automated exposure control, adjustment of the mA and/or kV according to patient size and/or use of iterative reconstruction technique. COMPARISON:  No pertinent prior exams available for comparison. FINDINGS: Brain: No age advanced or lobar predominant parenchymal atrophy. There is no acute intracranial hemorrhage. No demarcated cortical infarct. No extra-axial fluid collection. No evidence of an intracranial mass. No midline shift. Vascular: No hyperdense vessel. Skull: No mass or acute finding within the imaged orbits. Sinuses/Orbits: No mass or acute finding within the imaged orbits. Mild mucosal thickening within the bilateral maxillary, sphenoid and ethmoid sinuses. IMPRESSION: 1.  No evidence of an acute intracranial abnormality. 2. Mild paranasal sinus mucosal thickening. Electronically Signed   By:  Simmering D.O.   On: 07/09/2022 16:23   DG Chest Port 1 View  Result Date: 06/20/2022 CLINICAL DATA:  Provided history: Weakness. Altered mental status. Hypoglycemia. EXAM: PORTABLE CHEST 1 VIEW COMPARISON:  Prior chest radiographs 09/07/2021 and earlier. FINDINGS: Heart size at the upper limits of normal. Small ill-defined opacity within the medial right lung base, with an appearance favoring atelectasis. No appreciable airspace consolidation on the left. No evidence of pulmonary edema. No evidence of pleural effusion or pneumothorax. Degenerative changes of the spine. IMPRESSION: Small ill-defined opacity within the medial right lung base, with an appearance favoring atelectasis. Otherwise, no evidence of acute cardiopulmonary abnormality. Electronically Signed   By:  Simmering D.O.   On: 06/21/2022 14:26    Medications: Infusions:  sodium chloride     sodium chloride     amiodarone     Followed by   amiodarone     calcium gluconate     dextrose Stopped (07/01/22 0735)   fentaNYL infusion INTRAVENOUS     norepinephrine (LEVOPHED) Adult infusion 10 mcg/min (07/01/22 0821)   phenylephrine (NEO-SYNEPHRINE) Adult infusion     prismasol BGK 2/2.5 dialysis solution 1,500 mL/hr at 07/01/22 0429   prismasol BGK 2/2.5 replacement solution 400 mL/hr at 07/01/22 0521   prismasol BGK 2/2.5 replacement solution 400 mL/hr at 06/30/22 1527   vancomycin 1,250 mg (06/30/22 1400)   vasopressin 0.03 Units/min (07/01/22 0800)    Scheduled Medications:  EPINEPHrine       amiodarone  150 mg Intravenous Once   Chlorhexidine Gluconate Cloth  6 each Topical Daily   docusate  100 mg Per Tube BID   feeding supplement (PROSource TF20)  60 mL Per Tube TID   feeding supplement (VITAL 1.5 CAL)  1,000 mL Per Tube [id number]   folic acid  1 mg Per Tube Daily   lactulose  30 g Per Tube TID   leptospermum manuka honey  1 Application Topical Daily   multivitamin  1 tablet Per Tube QHS   mouth rinse  15 mL Mouth Rinse Q2H   pantoprazole (PROTONIX) IV  40 mg Intravenous Q12H   polyethylene glycol  17 g Per Tube Daily   thiamine  100 mg Per Tube Daily    have reviewed scheduled and prn medications.  Physical Exam: General: sedated on vent Heart: RRR Lungs: CBS bilat Abdomen: distended  Extremities: pitting edema  Dialysis Access: right IJ vascath placed 3/12     07/01/2022,8:31 AM  LOS: 2 days

## 2022-07-01 NOTE — Consult Note (Signed)
WOC consult requested for right leg wound.  This was already performed yesterday.  Please refer to previous consult note, and topical treatment orders have been provided for bedside nurses to perform.  Please re-consult if further assistance is needed.  Thank-you,  Julien Girt MSN, [place], [place], [place], [place]

## 2022-07-01 NOTE — Consult Note (Addendum)
Cardiology Consultation   Patient ID: Kyle Pitts MRN: [id number]; DOB: [birth year]  Admit date: 06/28/2022 Date of Consult: 07/01/2022  PCP:  Merryl Hacker No   [facility] Providers Cardiologist:  [facility] (Dr. Ellyn Hack)  Patient Profile:   Kyle Pitts is a 62 y.o. 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 Vaughan Browner.  History of Present Illness:   Kyle Pitts is a 62 year old male with the above history.  No known cardiac history.  Patient has a history of alcoholic cirrhosis with recurrent ascites requiring multiple paracentesis.  He ultimately underwent TIPS procedure in 08/2021.  He was admitted to [facility] on 06/29/2018 for for altered mental status and severe hypoglycemia after being found down by his sister.  Unknown total downtime.  EMS arrival, blood glucose was in the 20s to 30s.  Upon arrival to the ED, he was noted to have multiple metabolic derangements including Na 122, K 5.6, CO2 17, BUN 61, Cr 5.42, iCa 0.84. WBC 20.2, Hgb 7.9, Plts 40. INR 3.7. Lactic acid 5.7. ABG showed pH of 7.334, pO2 of 40, pCO2 of 30.9, and Bicarb of 16.7. Blood cultures were draw and came back positive for MRSA bacteremia. ETOH level negative. High-sensitivity troponin elevated but flat in the 3,000s peaking at 3,345. BNP elevated in the 900s. Chest x-ray showed small ill-defined opacity within the medial right lung base favoring atelectasis. Head CT showed no acute intracranial findings. Abdominal/ pelvic CT showed scattered airspace opacities in the lung bases (right > left), interval TIPS with resolution of abdominal ascites, and cholelithiasis. He was admitted with suspected septic shock. Suspected source is right lower extremity cellulitis but possible right lower lobe pneumonia as well. He was started on IV fluids and Levophed and then ultimately. He  required intubation on morning of 06/30/2022 due to worsening respiratory status. He was also started on Vasopressin for additional pressor support. Nephrology was consulted and he was started on CRRT. Echo yesterday showed LVEF of 70-75% with no regional wall motion abnormalities, mildly enlarged RV with normal RV function, and moderately eleavted PASP. Overnight, he had some short episodes of VT that he would come out of on his own as well as longer episodes of SVT. He was given a bolus of IV Amiodarone with improvement and did well for several hours until earlier this morning when he started having recurrent SVT with rates as high as the 190s to 200s. Cardiology was consulted for an urgent consult. Shortly after Dr. Ellyn Hack and I arrived in the room, patient went into the VT. He was defibrillated at 200 J with conversion to sinus rhythm with rates in the 70s. Several minutes later, he was went back into VT and was again successfully defibrillated. He was started on IV Amiodarone with '300mg'$  bolus. By the time we left the room, patient was maintaining sinus rhythm with rates in the 70s. Levophed was stopped and Epinephrine and Vasopressin were continued. He remains intubated and sedated so unable to gather any additional history.  Past Medical History:  Diagnosis Date   Allergic reaction 11/21/2020   Anemia    DENIES   Asthma    "SLIGHT"   Blood transfusion without reported diagnosis    Cataract    LEFT EYE,RIGHT EYE REMOVED   Cirrhosis ([place])    Hyponatremia    Myofasciitis 11/21/2020  Osteomyelitis of right leg ([place]) 11/21/2020   Septic arthritis ([place])    Streptococcus infection, group A 11/21/2020    Past Surgical History:  Procedure Laterality Date   BIOPSY  02/15/2021   Procedure: BIOPSY;  Surgeon: Daryel November, MD;  Location: [facility] ENDOSCOPY;  Service: Gastroenterology;;   ESOPHAGOGASTRODUODENOSCOPY N/A 02/15/2021   Procedure: ESOPHAGOGASTRODUODENOSCOPY (EGD);  Surgeon: Daryel November, MD;  Location: [place];  Service: Gastroenterology;  Laterality: N/A;   ESOPHAGOGASTRODUODENOSCOPY (EGD) WITH PROPOFOL N/A 03/19/2021   Procedure: ESOPHAGOGASTRODUODENOSCOPY (EGD) WITH PROPOFOL;  Surgeon: Sharyn Creamer, MD;  Location: [place];  Service: Gastroenterology;  Laterality: N/A;   HEMOSTASIS CLIP PLACEMENT  03/19/2021   Procedure: HEMOSTASIS CLIP PLACEMENT;  Surgeon: Sharyn Creamer, MD;  Location: [facility] ENDOSCOPY;  Service: Gastroenterology;;   I & D EXTREMITY Right 10/09/2020   Procedure: IRRIGATION AND DEBRIDEMENT EXTREMITY;  Surgeon: Nicholes Stairs, MD;  Location: [facility];  Service: Orthopedics;  Laterality: Right;   IR IVUS EACH ADDITIONAL NON CORONARY VESSEL  09/05/2021   IR PARACENTESIS  03/18/2021   IR PARACENTESIS  04/09/2021   IR PARACENTESIS  04/22/2021   IR PARACENTESIS  05/02/2021   IR PARACENTESIS  05/16/2021   IR PARACENTESIS  05/29/2021   IR PARACENTESIS  06/13/2021   IR PARACENTESIS  06/25/2021   IR PARACENTESIS  07/04/2021   IR PARACENTESIS  07/16/2021   IR PARACENTESIS  07/25/2021   IR PARACENTESIS  07/31/2021   IR PARACENTESIS  08/06/2021   IR PARACENTESIS  08/14/2021   IR PARACENTESIS  08/21/2021   IR PARACENTESIS  08/29/2021   IR PARACENTESIS  09/05/2021   IR PARACENTESIS  09/24/2021   IR PARACENTESIS  10/07/2021   IR PARACENTESIS  10/23/2021   IR RADIOLOGIST EVAL & MGMT  08/11/2021   IR RADIOLOGIST EVAL & MGMT  10/14/2021   IR RADIOLOGIST EVAL & MGMT  12/15/2021   IR RADIOLOGIST EVAL & MGMT  03/16/2022   IR TIPS  09/05/2021   IR US GUIDE VASC ACCESS RIGHT  09/05/2021   KNEE ARTHROSCOPY Right 09/27/2020   Procedure: ARTHROSCOPY KNEE, [place] OUT;  Surgeon: Melina Schools, MD;  Location: [facility];  Service: Orthopedics;  Laterality: Right;   KNEE ARTHROSCOPY Right 10/09/2020   Procedure: ARTHROSCOPY KNEE,ARTHROSCOPIC I & D, CALF ABSCESS;  Surgeon: Nicholes Stairs, MD;  Location: [facility];  Service: Orthopedics;  Laterality: Right;   RADIOLOGY WITH ANESTHESIA  N/A 09/05/2021   Procedure: TIPS;  Surgeon: Suzette Battiest, MD;  Location: [place];  Service: Radiology;  Laterality: N/A;     Home Medications:  Prior to Admission medications   Medication Sig Start Date End Date Taking? Authorizing Provider  furosemide (LASIX) 20 MG tablet TAKE 1 TABLET(20 MG) BY MOUTH TWICE DAILY 04/21/22   Daryel November, MD  Iron, Ferrous Sulfate, 325 (65 Fe) MG TABS Take 325 mg by mouth daily. 04/07/22   Daryel November, MD  lactulose (CHRONULAC) 10 GM/15ML solution Take 30 mLs (20 g total) by mouth 3 (three) times daily. 06/18/22   Daryel November, MD  lactulose, encephalopathy, (CHRONULAC) 10 GM/15ML SOLN Take 30 mLs (20 g total) by mouth 2 (two) times daily. Patient not taking: Reported on 04/01/2022 12/04/21   Daryel November, MD  meloxicam (MOBIC) 7.5 MG tablet Take 1 tablet (7.5 mg total) by mouth every 12 (twelve) hours as needed for pain. 04/30/22   Daryel November, MD  midodrine (PROAMATINE) 10 MG tablet Take 1 tablet (10 mg total)  by mouth 3 (three) times daily with meals. 12/18/21   Daryel November, MD  oxyCODONE (OXY IR/ROXICODONE) 5 MG immediate release tablet Take 1 tablet (5 mg total) by mouth every 6 (six) hours as needed for moderate pain. Patient not taking: Reported on 03/16/2022 09/07/21   Allred, Darrell K, PA-C  pantoprazole (PROTONIX) 40 MG tablet TAKE 1 TABLET(40 MG) BY MOUTH TWICE DAILY 04/30/22   Daryel November, MD  spironolactone (ALDACTONE) 25 MG tablet Take 2 tablets (50 mg total) by mouth 2 (two) times daily. Patient taking differently: Take 25 mg by mouth 2 (two) times daily. 06/25/21   Daryel November, MD    Inpatient Medications: Scheduled Meds:  EPINEPHrine       amiodarone  150 mg Intravenous Once   amiodarone  150 mg Intravenous Once   Chlorhexidine Gluconate Cloth  6 each Topical Daily   darbepoetin (ARANESP) injection - NON-DIALYSIS  200 mcg Subcutaneous Once   docusate  100 mg Per Tube BID   feeding  supplement (PROSource TF20)  60 mL Per Tube TID   feeding supplement (VITAL 1.5 CAL)  1,000 mL Per Tube [id number]   folic acid  1 mg Per Tube Daily   lactulose  30 g Per Tube TID   leptospermum manuka honey  1 Application Topical Daily   multivitamin  1 tablet Per Tube QHS   mouth rinse  15 mL Mouth Rinse Q2H   pantoprazole (PROTONIX) IV  40 mg Intravenous Q12H   polyethylene glycol  17 g Per Tube Daily   thiamine  100 mg Per Tube Daily   Continuous Infusions:  amiodarone     calcium gluconate in NaCl     sodium chloride     sodium chloride     amiodarone 60 mg/hr (07/01/22 0917)   Followed by   amiodarone     calcium gluconate     dextrose 20 mL/hr at 07/01/22 0900   fentaNYL infusion INTRAVENOUS     norepinephrine (LEVOPHED) Adult infusion Stopped (07/01/22 0859)   phenylephrine (NEO-SYNEPHRINE) Adult infusion 400 mcg/min (07/01/22 0900)   prismasol BGK 2/2.5 dialysis solution 1,500 mL/hr at 07/01/22 0429   prismasol BGK 2/2.5 replacement solution 400 mL/hr at 07/01/22 0521   prismasol BGK 2/2.5 replacement solution 400 mL/hr at 06/30/22 1527   vancomycin 1,250 mg (06/30/22 1400)   vasopressin 0.03 Units/min (07/01/22 0900)   PRN Meds: amiodarone, calcium gluconate in NaCl, EPINEPHrine, Place/Maintain arterial line **AND** sodium chloride, fentaNYL, heparin, midazolam, ondansetron (ZOFRAN) IV, mouth rinse, sodium chloride  Allergies:    Allergies  Allergen Reactions   Latex Itching   Tape Itching   Amoxicillin Itching    Itching around eyes and scalp on amoxicilin   Cephalexin Nausea Only    Upset stomach Ceftriaxone is ok   Penicillins Rash    Social History:   Social History   Socioeconomic History   Marital status: Divorced    Spouse name: Not on file   Number of children: 2   Years of education: Not on file   Highest education level: Not on file  Occupational History   Not on file  Tobacco Use   Smoking status: Never    Passive exposure: Never   Smokeless  tobacco: Never  Vaping Use   Vaping Use: Never used  Substance and Sexual Activity   Alcohol use: Not Currently   Drug use: Not Currently   Sexual activity: Not Currently  Other Topics Concern   Not on file  Social History Narrative   Not on file   Social Determinants of Health   Financial Resource Strain: Not on file  Food Insecurity: Not on file  Transportation Needs: Not on file  Physical Activity: Not on file  Stress: Not on file  Social Connections: Not on file  Intimate Partner Violence: Not on file    Family History:   Family History  Problem Relation Age of Onset   Hepatitis Mother    Cirrhosis Mother    Stroke Father    Heart failure Father    Colon cancer Father    Kidney failure Brother    Liver cancer Brother    Stomach cancer Neg Hx    Esophageal cancer Neg Hx    Colon polyps Neg Hx    Crohn's disease Neg Hx    Rectal cancer Neg Hx    Ulcerative colitis Neg Hx      ROS:  Please see the history of present illness.  Review of Systems  Unable to perform ROS: Intubated (Critical illness)   Physical Exam/Data:   Vitals:   07/01/22 0700 07/01/22 0756 07/01/22 0800 07/01/22 0900  BP: (!) 135/58  (!) 129/52 98/61  Pulse: 72 69 71 96  Resp: (!) 6 (!) '21 16 13  '$ Temp: 98.6 F (37 C) 98.4 F (36.9 C) 98.4 F (36.9 C) 98.6 F (37 C)  TempSrc:      SpO2: 99% 97% 97% 96%  Weight:      Height:        Intake/Output Summary (Last 24 hours) at 07/01/2022 0917 Last data filed at 07/01/2022 0900 Gross per 24 hour  Intake 4673.03 ml  Output 1720.9 ml  Net 2952.13 ml      07/01/2022    5:00 AM 06/30/2022    7:06 AM 04/01/2022   11:06 AM  Last 3 Weights  Weight (lbs) 240 lb 11.9 oz 233 lb 11 oz 210 lb  Weight (kg) 109.2 kg 106 kg 95.255 kg     Body mass index is 32.65 kg/m.  General: 62 y.o. Caucasian male. Intubated and sedated.  Ill-appearing but critically and chronically. HEENT: Normocephalic and atraumatic.  Heart: RRR. Distinct S1 and S2. No  murmurs, gallops, or rubs Lungs: No increased work of breathing. Clear to ausculation bilaterally. No wheezes, rhonchi, or rales.  Abdomen: Soft, non-distended, and non-tender to palpation.  Extremities: Mild right lower extremity edema and erythema.  Tense and swollen right knee Skin: Warm and dry. Neuro: Intubated and sedated. Psych: Intubated and sedated.  EKG:  The EKG was personally reviewed and demonstrates:   - Initial EKG on 06/23/2022 showed normal sinus rhythm rate 82 bpm, with PAC, and slight ST depression and inferior leads and leads V4-V6. - EKG on 06/30/2022 at 21: 47 as of VT with rate of 172 bpm - EKG on 06/30/2022 at 22:23 automatically read as atrial fibrillation but looks more like SVT/PAT rate of 140.  = Personally reviewed.  Telemetry:  Telemetry was personally reviewed and demonstrates:  Underlying sinus rhythm with baseline ratesd in the 70s. Intermittent short episodes of VT overnight as well as longer runs of paroxysmal SVT overnight. Went back into SVT this morning with rates as high as the 90s and then went back into sustained VT requiring defibrillation x2. Currently back in normal sinus rhythm with rates in the 70s.= Personally reviewed.  Relevant CV Studies:  Echocardiogram 06/30/2022: Hyperdynamic LV with EF 7075%.  No RWMA.  Indeterminate diastolic parameters.  Hyperdynamic RV with  mild RV enlargement and mildly elevated PAP.  Normal aortic and mitral valves.  Elevated RAP estimated 15 mmHg with dilated IVC.  No evidence of valvular vegetation.  Laboratory Data:  High Sensitivity Troponin:   Recent Labs  Lab 06/19/2022 1353 07/06/2022 1805 06/26/2022 2010 06/26/2022 2220  TROPONINIHS 3,201* 2,597* 3,345* 3,256*     Chemistry Recent Labs  Lab 06/30/22 1015 06/30/22 1158 06/30/22 1653 06/30/22 2155 07/01/22 0509 07/01/22 0751  NA 127*   < >  --  126* 128* 143  K 5.6*   < >  --  5.5* 5.0 4.8  CL 95*  --   --  92* 96*  --   CO2 20*  --   --  23 23  --    GLUCOSE 60*  --   --  130* 127*  --   BUN 66*  --   --  56* 48*  --   CREATININE 5.91*  --   --  4.58* 3.92*  --   CALCIUM 6.8*  --   --  7.0* 7.1*  --   MG 2.1  --  2.1 2.2 2.3  --   GFRNONAA 10*  --   --  14* 17*  --   ANIONGAP 12  --   --  11 9  --    < > = values in this interval not displayed.    Recent Labs  Lab 06/30/22 0727 06/30/22 1015 07/01/22 0509  PROT 5.3* 5.3* 5.8*  ALBUMIN 2.1* 2.1* 2.6*  2.6*  AST 136* 145* 145*  ALT 36 39 39  ALKPHOS 274* 305* 283*  BILITOT 10.0* 9.9* 12.5*   Lipids No results for input(s): "CHOL", "TRIG", "HDL", "LABVLDL", "LDLCALC", "CHOLHDL" in the last 168 hours.  Hematology Recent Labs  Lab 07/10/2022 1353 07/02/2022 1409 06/30/22 0358 06/30/22 0946 06/30/22 1158 06/30/22 1225 07/01/22 0509 07/01/22 0751  WBC 20.2*  --  36.8*  --   --   --  30.6*  --   RBC 2.20*  --  2.10*  --   --   --  2.10*  --   HGB 7.9*   < > 7.6*   < > 8.5*  --  7.5* 7.5*  HCT 23.8*   < > 21.7*   < > 25.0*  --  22.4* 22.0*  MCV 108.2*  --  103.3*  --   --   --  106.7*  --   MCH 35.9*  --  36.2*  --   --   --  35.7*  --   MCHC 33.2  --  35.0  --   --   --  33.5  --   RDW 16.9*  --  16.8*  --   --   --  16.9*  --   PLT 40*  --  32*  --   --  59* 32*  --    < > = values in this interval not displayed.   Thyroid No results for input(s): "TSH", "FREET4" in the last 168 hours.  BNP Recent Labs  Lab 06/22/2022 1353  BNP 956.1*    DDimer  Recent Labs  Lab 06/30/22 1225  DDIMER >20.00*     Radiology/Studies:  DG Chest Port 1 View  Result Date: 07/01/2022 CLINICAL DATA:  Intubation, sepsis, respiratory failure EXAM: PORTABLE CHEST 1 VIEW COMPARISON:  Portable exam 0525 hours compared to 06/30/2022 FINDINGS: Tip of endotracheal tube projects 3.0 cm above carina. Nasogastric tube coiled in proximal stomach. BILATERAL  jugular lines with tips projecting over SVC. Normal heart size and mediastinal contours. Patchy BILATERAL airspace infiltrates again seen. No  pleural effusion or pneumothorax. IMPRESSION: Persistent BILATERAL pulmonary infiltrates. Electronically Signed   By: Lavonia Dana M.D.   On: 07/01/2022 07:40   DG CHEST PORT 1 VIEW  Result Date: 06/30/2022 CLINICAL DATA:  Line placement for dialysis, intubation EXAM: PORTABLE CHEST 1 VIEW COMPARISON:  Portable exam 1240 hours compared to 0913 hours FINDINGS: Tip of endotracheal tube projects 2.5 cm above carina. Nasogastric tube coiled in proximal stomach. LEFT jugular line tip projects over SVC. New dual lumen RIGHT jugular central venous catheter with tip projecting over SVC. Enlargement of cardiac silhouette with vascular congestion. BILATERAL pulmonary infiltrates favor pulmonary edema. No pleural effusion or pneumothorax. IMPRESSION: No pneumothorax following RIGHT jugular line placement. Enlargement of cardiac silhouette with pulmonary vascular congestion and diffuse infiltrates most consistent with pulmonary edema. Electronically Signed   By: Lavonia Dana M.D.   On: 06/30/2022 12:54   ECHOCARDIOGRAM COMPLETE  Result Date: 06/30/2022    ECHOCARDIOGRAM REPORT   Patient Name:   HOKE FROMM Date of Exam: 06/30/2022 Medical Rec #:  [id number]       Height:       72.0 in Accession #:    [id number]      Weight:       233.7 lb Date of Birth:  Apr 25, 1960       BSA:          2.276 m Patient Age:    43 years        BP:           115/50 mmHg Patient Gender: M               HR:           86 bpm. Exam Location:  Inpatient Procedure: 2D Echo, Cardiac Doppler and Color Doppler STAT ECHO Indications:    Bacteremia. Shock  History:        Patient has prior history of Echocardiogram examinations, most                 recent 08/27/2021. Abnormal ECG, Arrythmias:Atrial Fibrillation;                 Signs/Symptoms:Bacteremia.  Sonographer:    Roseanna Rainbow RDCS Referring Phys: [id number] PAULA B SIMPSON  Sonographer Comments: Technically difficult study due to poor echo windows and echo performed with patient supine and on  artificial respirator. STAT echo for shock. Study rushed for central line placement. IMPRESSIONS  1. Left ventricular ejection fraction, by estimation, is 70 to 75%. The left ventricle has hyperdynamic function. The left ventricle has no regional wall motion abnormalities. Left ventricular diastolic parameters are indeterminate.  2. Right ventricular systolic function is hyperdynamic. The right ventricular size is mildly enlarged. There is moderately elevated pulmonary artery systolic pressure.  3. The mitral valve is normal in structure. No evidence of mitral valve regurgitation. No evidence of mitral stenosis.  4. The aortic valve is tricuspid. There is mild calcification of the aortic valve. Aortic valve regurgitation is trivial.  5. The inferior vena cava is dilated in size with <50% respiratory variability, suggesting right atrial pressure of 15 mmHg.  6. Elvated aortic flows reflect hyperdynamic state rather that aortic stenosis. Conclusion(s)/Recommendation(s): No evidence of valvular vegetations on this transthoracic echocardiogram. Consider a transesophageal echocardiogram to exclude infective endocarditis if clinically indicated. FINDINGS  Left Ventricle: Left ventricular ejection fraction, by estimation, is  70 to 75%. The left ventricle has hyperdynamic function. The left ventricle has no regional wall motion abnormalities. The left ventricular internal cavity size was normal in size. There is no left ventricular hypertrophy. Left ventricular diastolic parameters are indeterminate. Right Ventricle: The right ventricular size is mildly enlarged. No increase in right ventricular wall thickness. Right ventricular systolic function is hyperdynamic. There is moderately elevated pulmonary artery systolic pressure. The tricuspid regurgitant velocity is 3.24 m/s, and with an assumed right atrial pressure of 15 mmHg, the estimated right ventricular systolic pressure is 0000000 mmHg. Left Atrium: Left atrial size was  normal in size. Right Atrium: Right atrial size was normal in size. Pericardium: There is no evidence of pericardial effusion. Mitral Valve: The mitral valve is normal in structure. No evidence of mitral valve regurgitation. No evidence of mitral valve stenosis. Tricuspid Valve: The tricuspid valve is normal in structure. Tricuspid valve regurgitation is trivial. No evidence of tricuspid stenosis. Aortic Valve: The aortic valve is tricuspid. There is mild calcification of the aortic valve. There is mild aortic valve annular calcification. Aortic valve regurgitation is trivial. Aortic valve mean gradient measures 17.0 mmHg. Aortic valve peak gradient measures 31.8 mmHg. Aortic valve area, by VTI measures 4.06 cm. Pulmonic Valve: The pulmonic valve was normal in structure. Pulmonic valve regurgitation is not visualized. No evidence of pulmonic stenosis. Aorta: The aortic root is normal in size and structure. Venous: The inferior vena cava is dilated in size with less than 50% respiratory variability, suggesting right atrial pressure of 15 mmHg. IAS/Shunts: The interatrial septum was not well visualized.  LEFT VENTRICLE PLAX 2D LVIDd:         5.60 cm LVIDs:         3.60 cm LV PW:         0.90 cm LV IVS:        0.90 cm LVOT diam:     2.60 cm LV SV:         153 LV SV Index:   67 LVOT Area:     5.31 cm  RIGHT VENTRICLE             IVC RV S prime:     17.20 cm/s  IVC diam: 2.60 cm TAPSE (M-mode): 2.4 cm LEFT ATRIUM           Index        RIGHT ATRIUM           Index LA diam:      3.60 cm 1.58 cm/m   RA Area:     18.40 cm LA Vol (A2C): 52.1 ml 22.89 ml/m  RA Volume:   49.40 ml  21.70 ml/m LA Vol (A4C): 48.4 ml 21.26 ml/m  AORTIC VALVE AV Area (Vmax):    3.22 cm AV Area (Vmean):   3.70 cm AV Area (VTI):     4.06 cm AV Vmax:           282.00 cm/s AV Vmean:          172.000 cm/s AV VTI:            0.377 m AV Peak Grad:      31.8 mmHg AV Mean Grad:      17.0 mmHg LVOT Vmax:         171.00 cm/s LVOT Vmean:         120.000 cm/s LVOT VTI:          0.288 m LVOT/AV VTI ratio: 0.76  AORTA Ao  Root diam: 3.40 cm MITRAL VALVE                TRICUSPID VALVE MV Area (PHT): 4.09 cm     TR Peak grad:   42.0 mmHg MV Decel Time: 186 msec     TR Vmax:        324.00 cm/s MV E velocity: 135.67 cm/s MV A velocity: 91.80 cm/s   SHUNTS MV E/A ratio:  1.48         Systemic VTI:  0.29 m                             Systemic Diam: 2.60 cm Rudean Haskell MD Electronically signed by Rudean Haskell MD Signature Date/Time: 06/30/2022/12:05:25 PM    Final    DG Tibia/Fibula Right Port  Result Date: 06/30/2022 CLINICAL DATA:  Pain and swelling of the right lower extremity. EXAM: PORTABLE RIGHT TIBIA AND FIBULA - 2 VIEW COMPARISON:  Knee radiographs 09/27/2020 FINDINGS: Significant and markedly progressive knee joint degenerative changes. There also is a large knee joint effusion. This could be a progressive inflammatory/erosive arthropathy but could not exclude septic arthritis. Recommend correlation with clinical findings and joint aspiration as indicated. The tibia and fibula are intact. No destructive bony changes. Scattered vascular calcifications. The ankle joint is maintained. IMPRESSION: 1. Significant and markedly progressive knee joint degenerative changes and large knee joint effusion. Progressive inflammatory arthropathy versus septic arthritis. Recommend correlation with clinical findings and joint aspiration as indicated. 2. Intact tibia and fibula.  No destructive bony changes. Electronically Signed   By: Marijo Sanes M.D.   On: 06/30/2022 09:48   DG Chest Port 1 View  Result Date: 06/30/2022 CLINICAL DATA:  Central line placement. EXAM: PORTABLE CHEST 1 VIEW COMPARISON:  06/30/2022, earlier same day FINDINGS: 0913 hours. Endotracheal tube tip is approximately 3.9 cm above the base of the carina. NG tube tip is in the stomach. Left IJ central line tip projects in the region of the innominate vein confluence and may be  tenting the lateral wall of the vessel given the laterally directed position of the tip. Lung volumes remain low with right greater than left patchy diffuse airspace disease. The cardio pericardial silhouette is enlarged. Telemetry leads overlie the chest. IMPRESSION: Left IJ central line tip projects in the region of the innominate vein confluence and may be tenting the lateral wall of the vessel given the laterally directed position of the tip. No evidence for pneumothorax. Similar appearance of the right greater than left airspace disease. Electronically Signed   By: Misty Stanley M.D.   On: 06/30/2022 09:35   DG Chest Port 1 View  Result Date: 06/30/2022 CLINICAL DATA:  Possible sepsis, rule out aspiration. EXAM: PORTABLE CHEST 1 VIEW COMPARISON:  06/28/2022. FINDINGS: 0533 hours. Developing consolidation in the medial right lower lobe with patchy opacities throughout the right lung, favored to represent aspiration or multifocal pneumonia. New trace right pleural effusion. No pneumothorax. Stable cardiac and mediastinal contours, accounting for patient rotation. IMPRESSION: Developing consolidation in the medial right lower lobe with patchy opacities throughout the right lung, favored to represent aspiration or multifocal pneumonia. New trace right pleural effusion. Electronically Signed   By: Emmit Alexanders M.D.   On: 06/30/2022 08:16   [place] EKG SITE RITE  Result Date: 06/30/2022 If Site Rite image not attached, placement could not be confirmed due to current cardiac rhythm.  CT ABDOMEN PELVIS WO CONTRAST  Result Date: 07/14/2022 CLINICAL DATA:  Acute abdominal pain EXAM: CT ABDOMEN AND PELVIS WITHOUT CONTRAST TECHNIQUE: Multidetector CT imaging of the abdomen and pelvis was performed following the standard protocol without IV contrast. RADIATION DOSE REDUCTION: This exam was performed according to the departmental dose-optimization program which includes automated exposure control, adjustment of the  mA and/or kV according to patient size and/or use of iterative reconstruction technique. COMPARISON:  03/15/2021 FINDINGS: Lower chest: Blood pool is hypodense compared to the interventricular septum consistent with anemia. Scattered coronary calcifications. Trace pleural fluid. Scattered airspace opacities in the lung bases right greater than left, new since prior. Hepatobiliary: Interval TIPS. Small partially calcified stones layer in the dependent aspect of the gallbladder which is nondilated. No definite liver lesion or biliary ductal dilatation. Pancreas: Unremarkable. No pancreatic ductal dilatation or surrounding inflammatory changes. Spleen: Normal in size without focal abnormality. Adrenals/Urinary Tract: No adrenal mass. No urolithiasis or hydronephrosis. Symmetric renal contours. Urinary bladder decompressed by Foley catheter. Stomach/Bowel: Stomach is nondistended, unremarkable. Small bowel decompressed. Normal appendix. Colon is partially distended by gas and fecal material. Vascular/Lymphatic: Scattered aortoiliac calcified atheromatous plaque without aneurysm. TIPS projects in expected location. No abdominal or pelvic adenopathy. Reproductive: Prostate is unremarkable. Other: Trace pelvic fluid.  No abdominal ascites.  No free air. Musculoskeletal: Spondylitic changes throughout the visualized lower thoracic and lumbosacral spine as before. No fracture or other acute finding. IMPRESSION: 1. Scattered airspace opacities in the lung bases right greater than left, new since prior. 2. Interval TIPS, resolution of abdominal ascites. 3. Cholelithiasis. 4. Coronary and aortic Atherosclerosis (ICD10-I70.0). Electronically Signed   By: Lucrezia Europe M.D.   On: 06/27/2022 16:26   CT Head Wo Contrast  Result Date: 07/05/2022 CLINICAL DATA:  Provided history: Mental status change, unknown cause. EXAM: CT HEAD WITHOUT CONTRAST TECHNIQUE: Contiguous axial images were obtained from the base of the skull through the  vertex without intravenous contrast. RADIATION DOSE REDUCTION: This exam was performed according to the departmental dose-optimization program which includes automated exposure control, adjustment of the mA and/or kV according to patient size and/or use of iterative reconstruction technique. COMPARISON:  No pertinent prior exams available for comparison. FINDINGS: Brain: No age advanced or lobar predominant parenchymal atrophy. There is no acute intracranial hemorrhage. No demarcated cortical infarct. No extra-axial fluid collection. No evidence of an intracranial mass. No midline shift. Vascular: No hyperdense vessel. Skull: No mass or acute finding within the imaged orbits. Sinuses/Orbits: No mass or acute finding within the imaged orbits. Mild mucosal thickening within the bilateral maxillary, sphenoid and ethmoid sinuses. IMPRESSION: 1.  No evidence of an acute intracranial abnormality. 2. Mild paranasal sinus mucosal thickening. Electronically Signed   By: Kellie Simmering D.O.   On: 07/01/2022 16:23   DG Chest Port 1 View  Result Date: 06/27/2022 CLINICAL DATA:  Provided history: Weakness. Altered mental status. Hypoglycemia. EXAM: PORTABLE CHEST 1 VIEW COMPARISON:  Prior chest radiographs 09/07/2021 and earlier. FINDINGS: Heart size at the upper limits of normal. Small ill-defined opacity within the medial right lung base, with an appearance favoring atelectasis. No appreciable airspace consolidation on the left. No evidence of pulmonary edema. No evidence of pleural effusion or pneumothorax. Degenerative changes of the spine. IMPRESSION: Small ill-defined opacity within the medial right lung base, with an appearance favoring atelectasis. Otherwise, no evidence of acute cardiopulmonary abnormality. Electronically Signed   By: Kellie Simmering D.O.   On: 06/21/2022 14:26     Assessment and Plan:   VT Paroxysmal SVT Patient  was admitted for septic shock likely due to right lower leg cellulitis and possible  right lower lobe pneumonia after being found down and at home and severely hypoglycemic sugars in the 20s and 30s.  Found to have multiple metabolic derangements on admission.  He was initially in normal sinus rhythm.  Overnight, he began to have runs of VT that would self terminate as well as longer runs of SVT.  He received 50 mg of IV amiodarone overnight and this seemed to help.  He maintained sinus rhythm for several hours until this morning when he went back into SVT with rates as high as the 190s-200s.  He then went back into VT and required defibrillation x 2 with restoration of sinus rhythm.  Echo yesterday showed LVEF of 70-75% with no regional wall motion abnormalities. - Continue IV Amiodarone drip after '300mg'$  bolus. - No beta-blocker due to septic shock.  - Please keep magnesium >4.0 and potassium > 2.0. - He has no known CAD. Although abdominal/ pelvic CT does show coronary and aortic atherosclerosis. He is not a candidate for cardiac catheterization at this time with renal function requiring CRRT and significant coagulopathy with platelets of 32,000 and INR of 3.0. Will continue to treat with Amiodarone at this time. This is not a good long term option for him given his underlying cirrhosis but this is our only option right now. Would continue IV amiodarone load for the 24-hour IV load after the initial 250 mg boluses were administered.  Would likely convert to enteral dosing at 400 mg twice daily upon completion of 24-hour load. => Would not discharge on amiodarone  Elevated Troponin High-sensitivity troponin elevated but flat in the 3,000s peaking at 3,345. Echo showed normal LV function with no regional wall motion abnormalities. => Related to demand ischemia from critical illness.  With no wall motion maladies and hyperdynamic LV, it is unlikely that he had a major myocardial infarction. - Intubated and sedated.  - Not a candidate for cardiac catheterization as above. No IV Heparin at  this time given INR of 3.0.   Otherwise, per primary team: - Septic shock MRSA bacteremia-> likely makes driving force for his arrhythmias. -  - Acute hypoxic respiratory failure s/p intubation on 3/12  - Chronic hypotension on Midodrine at home  - ETOH cirrhosis s/p TIPS procedure in [id number] - Acute metabolic encephalopathy - Hyperammonemia  - Chronic anemia - AKI s/p CRRT - Lactic acidosis  Risk Assessment/Risk Scores:    For questions or updates, please contact [place] Please consult [email or web address] for contact info under    Signed, Darreld Mclean, PA-C  07/01/2022 9:17 AM   ATTENDING ATTESTATION  I have seen, examined and evaluated the patient this morning along with Sande Rives, PA-C.  After reviewing all the available data and chart, we discussed the patients laboratory, study & physical findings as well as symptoms in detail.  I agree with her findings, examination as well as impression recommendations as per our discussion.    Attending adjustments noted in italics.   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  Found to have elevated troponin and relatively flat trajectory more consistent with demand ischemia with critical illness.  He is now been having episodes of what appears to be SVT salvos intermittently with VT salvos.  Overnight episodes of VT seem to spontaneously resolve and return to sinus tachycardia, however today he has had at  least 2 episodes of VT requiring cardioversion.  He was started on IV amiodarone and given 2 boluses of 150 mg and is now on IV drip.  I went back to see him several hours later he was remaining stable from a arrhythmia standpoint and pressures were stable.    Would continue the IV amiodarone followed by oral amiodarone load as noted above.  I do not think to be a good candidate for longstanding amiodarone upon discharge  due to his underlying liver failure.  We will reassess tomorrow to ensure that he is maintaining sinus rhythm.   CRITICAL CARE  The patient is critically ill with multiple organ systems failure and requires high complexity decision making for assessment and support, frequent evaluation and titration of therapies, application of advanced monitoring technologies and extensive interpretation of multiple databases.    Critical Care Time devoted to patient care services described in this note is  76 Minutes.   Performed [id number] [place] care time was exclusive of separately billable procedures and treating other patients.  Critical care was necessary to treat or prevent imminent or life-threatening deterioration.  Critical care was time spent personally by me on the following activities: development of treatment plan with patient and/or surrogate as well as nursing, discussions with consultants, evaluation of patient's response to treatment, examination of patient, obtaining history from patient or surrogate, ordering and performing treatments and interventions, ordering and review of laboratory studies, ordering and review of radiographic studies, pulse oximetry and re-evaluation of patient's condition.  This time reflects time of care of this signee .mec  This critical care time does not reflect procedure time, or teaching time or supervisory time of PA/NP/Med student/Med Resident etc but could involve care discussion time with the patient, family & RN staff.       Leonie Man, MD, MS Glenetta Hew, M.D., M.S. Interventional Cardiologist  [place]  Pager # [phone] Phone # [phone] [address] [place], [place] [address]

## 2022-07-01 NOTE — Consult Note (Signed)
ORTHOPAEDIC CONSULTATION  REQUESTING PHYSICIAN: [name], MD  Chief Complaint: Chronic venous ulcer lateral right leg.  HPI: Kyle Pitts is a 62 y.o. male who presents with multiple medical problems currently intubated with chronic venous ulceration right leg.  Past Medical History:  Diagnosis Date   Allergic reaction 11/21/2020   Anemia    DENIES   Asthma    "SLIGHT"   Blood transfusion without reported diagnosis    Cataract    LEFT EYE,RIGHT EYE REMOVED   Cirrhosis ([place])    Hyponatremia    Myofasciitis 11/21/2020   Osteomyelitis of right leg ([place]) 11/21/2020   Septic arthritis ([place])    Streptococcus infection, group A 11/21/2020   Past Surgical History:  Procedure Laterality Date   BIOPSY  02/15/2021   Procedure: BIOPSY;  Surgeon: Daryel November, MD;  Location: [facility];  Service: Gastroenterology;;   ESOPHAGOGASTRODUODENOSCOPY N/A 02/15/2021   Procedure: ESOPHAGOGASTRODUODENOSCOPY (EGD);  Surgeon: Daryel November, MD;  Location: [place];  Service: Gastroenterology;  Laterality: N/A;   ESOPHAGOGASTRODUODENOSCOPY (EGD) WITH PROPOFOL N/A 03/19/2021   Procedure: ESOPHAGOGASTRODUODENOSCOPY (EGD) WITH PROPOFOL;  Surgeon: Sharyn Creamer, MD;  Location: [place];  Service: Gastroenterology;  Laterality: N/A;   HEMOSTASIS CLIP PLACEMENT  03/19/2021   Procedure: HEMOSTASIS CLIP PLACEMENT;  Surgeon: Sharyn Creamer, MD;  Location: [facility] ENDOSCOPY;  Service: Gastroenterology;;   I & D EXTREMITY Right 10/09/2020   Procedure: IRRIGATION AND DEBRIDEMENT EXTREMITY;  Surgeon: Nicholes Stairs, MD;  Location: [facility];  Service: Orthopedics;  Laterality: Right;   IR IVUS EACH ADDITIONAL NON CORONARY VESSEL  09/05/2021   IR PARACENTESIS  03/18/2021   IR PARACENTESIS  04/09/2021   IR PARACENTESIS  04/22/2021   IR PARACENTESIS  05/02/2021   IR PARACENTESIS  05/16/2021   IR PARACENTESIS  05/29/2021   IR PARACENTESIS  06/13/2021   IR PARACENTESIS  06/25/2021   IR  PARACENTESIS  07/04/2021   IR PARACENTESIS  07/16/2021   IR PARACENTESIS  07/25/2021   IR PARACENTESIS  07/31/2021   IR PARACENTESIS  08/06/2021   IR PARACENTESIS  08/14/2021   IR PARACENTESIS  08/21/2021   IR PARACENTESIS  08/29/2021   IR PARACENTESIS  09/05/2021   IR PARACENTESIS  09/24/2021   IR PARACENTESIS  10/07/2021   IR PARACENTESIS  10/23/2021   IR RADIOLOGIST EVAL & MGMT  08/11/2021   IR RADIOLOGIST EVAL & MGMT  10/14/2021   IR RADIOLOGIST EVAL & MGMT  12/15/2021   IR RADIOLOGIST EVAL & MGMT  03/16/2022   IR TIPS  09/05/2021   IR US GUIDE VASC ACCESS RIGHT  09/05/2021   KNEE ARTHROSCOPY Right 09/27/2020   Procedure: ARTHROSCOPY KNEE, [place] OUT;  Surgeon: Melina Schools, MD;  Location: [facility];  Service: Orthopedics;  Laterality: Right;   KNEE ARTHROSCOPY Right 10/09/2020   Procedure: ARTHROSCOPY KNEE,ARTHROSCOPIC I & D, CALF ABSCESS;  Surgeon: Nicholes Stairs, MD;  Location: [facility];  Service: Orthopedics;  Laterality: Right;   RADIOLOGY WITH ANESTHESIA N/A 09/05/2021   Procedure: TIPS;  Surgeon: Suzette Battiest, MD;  Location: [place];  Service: Radiology;  Laterality: N/A;   Social History   Socioeconomic History   Marital status: Divorced    Spouse name: Not on file   Number of children: 2   Years of education: Not on file   Highest education level: Not on file  Occupational History   Not on file  Tobacco Use   Smoking status: Never    Passive exposure:  Never   Smokeless tobacco: Never  Vaping Use   Vaping Use: Never used  Substance and Sexual Activity   Alcohol use: Not Currently   Drug use: Not Currently   Sexual activity: Not Currently  Other Topics Concern   Not on file  Social History Narrative   Not on file   Social Determinants of Health   Financial Resource Strain: Not on file  Food Insecurity: Not on file  Transportation Needs: Not on file  Physical Activity: Not on file  Stress: Not on file  Social Connections: Not on file   Family History  Problem Relation  Age of Onset   Hepatitis Mother    Cirrhosis Mother    Stroke Father    Heart failure Father    Colon cancer Father    Kidney failure Brother    Liver cancer Brother    Stomach cancer Neg Hx    Esophageal cancer Neg Hx    Colon polyps Neg Hx    Crohn's disease Neg Hx    Rectal cancer Neg Hx    Ulcerative colitis Neg Hx    - negative except otherwise stated in the family history section Allergies  Allergen Reactions   Latex Itching   Tape Itching   Amoxicillin Itching    Itching around eyes and scalp on amoxicilin   Cephalexin Nausea Only    Upset stomach Ceftriaxone is ok   Penicillins Rash   Prior to Admission medications   Medication Sig Start Date End Date Taking? Authorizing Provider  furosemide (LASIX) 20 MG tablet TAKE 1 TABLET(20 MG) BY MOUTH TWICE DAILY 04/21/22   Daryel November, MD  Iron, Ferrous Sulfate, 325 (65 Fe) MG TABS Take 325 mg by mouth daily. 04/07/22   Daryel November, MD  lactulose (CHRONULAC) 10 GM/15ML solution Take 30 mLs (20 g total) by mouth 3 (three) times daily. 06/18/22   Daryel November, MD  lactulose, encephalopathy, (CHRONULAC) 10 GM/15ML SOLN Take 30 mLs (20 g total) by mouth 2 (two) times daily. Patient not taking: Reported on 04/01/2022 12/04/21   Daryel November, MD  meloxicam (MOBIC) 7.5 MG tablet Take 1 tablet (7.5 mg total) by mouth every 12 (twelve) hours as needed for pain. 04/30/22   Daryel November, MD  midodrine (PROAMATINE) 10 MG tablet Take 1 tablet (10 mg total) by mouth 3 (three) times daily with meals. 12/18/21   Daryel November, MD  oxyCODONE (OXY IR/ROXICODONE) 5 MG immediate release tablet Take 1 tablet (5 mg total) by mouth every 6 (six) hours as needed for moderate pain. Patient not taking: Reported on 03/16/2022 09/07/21   Allred, Darrell K, PA-C  pantoprazole (PROTONIX) 40 MG tablet TAKE 1 TABLET(40 MG) BY MOUTH TWICE DAILY 04/30/22   Daryel November, MD  spironolactone (ALDACTONE) 25 MG tablet Take 2  tablets (50 mg total) by mouth 2 (two) times daily. Patient taking differently: Take 25 mg by mouth 2 (two) times daily. 06/25/21   Daryel November, MD   DG Chest Port 1 View  Result Date: 07/01/2022 CLINICAL DATA:  Intubation, sepsis, respiratory failure EXAM: PORTABLE CHEST 1 VIEW COMPARISON:  Portable exam 0525 hours compared to 06/30/2022 FINDINGS: Tip of endotracheal tube projects 3.0 cm above carina. Nasogastric tube coiled in proximal stomach. BILATERAL jugular lines with tips projecting over SVC. Normal heart size and mediastinal contours. Patchy BILATERAL airspace infiltrates again seen. No pleural effusion or pneumothorax. IMPRESSION: Persistent BILATERAL pulmonary infiltrates. Electronically Signed   By:  Lavonia Dana M.D.   On: 07/01/2022 07:40   DG CHEST PORT 1 VIEW  Result Date: 06/30/2022 CLINICAL DATA:  Line placement for dialysis, intubation EXAM: PORTABLE CHEST 1 VIEW COMPARISON:  Portable exam 1240 hours compared to 0913 hours FINDINGS: Tip of endotracheal tube projects 2.5 cm above carina. Nasogastric tube coiled in proximal stomach. LEFT jugular line tip projects over SVC. New dual lumen RIGHT jugular central venous catheter with tip projecting over SVC. Enlargement of cardiac silhouette with vascular congestion. BILATERAL pulmonary infiltrates favor pulmonary edema. No pleural effusion or pneumothorax. IMPRESSION: No pneumothorax following RIGHT jugular line placement. Enlargement of cardiac silhouette with pulmonary vascular congestion and diffuse infiltrates most consistent with pulmonary edema. Electronically Signed   By: Lavonia Dana M.D.   On: 06/30/2022 12:54   ECHOCARDIOGRAM COMPLETE  Result Date: 06/30/2022    ECHOCARDIOGRAM REPORT   Patient Name:   CONRADO KROTZER Date of Exam: 06/30/2022 Medical Rec #:  [id number]       Height:       72.0 in Accession #:    [id number]      Weight:       233.7 lb Date of Birth:  20-May-1960       BSA:          2.276 m Patient Age:    69 years         BP:           115/50 mmHg Patient Gender: M               HR:           86 bpm. Exam Location:  Inpatient Procedure: 2D Echo, Cardiac Doppler and Color Doppler STAT ECHO Indications:    Bacteremia. Shock  History:        Patient has prior history of Echocardiogram examinations, most                 recent 08/27/2021. Abnormal ECG, Arrythmias:Atrial Fibrillation;                 Signs/Symptoms:Bacteremia.  Sonographer:    Roseanna Rainbow RDCS Referring Phys: [id number] PAULA B SIMPSON  Sonographer Comments: Technically difficult study due to poor echo windows and echo performed with patient supine and on artificial respirator. STAT echo for shock. Study rushed for central line placement. IMPRESSIONS  1. Left ventricular ejection fraction, by estimation, is 70 to 75%. The left ventricle has hyperdynamic function. The left ventricle has no regional wall motion abnormalities. Left ventricular diastolic parameters are indeterminate.  2. Right ventricular systolic function is hyperdynamic. The right ventricular size is mildly enlarged. There is moderately elevated pulmonary artery systolic pressure.  3. The mitral valve is normal in structure. No evidence of mitral valve regurgitation. No evidence of mitral stenosis.  4. The aortic valve is tricuspid. There is mild calcification of the aortic valve. Aortic valve regurgitation is trivial.  5. The inferior vena cava is dilated in size with <50% respiratory variability, suggesting right atrial pressure of 15 mmHg.  6. Elvated aortic flows reflect hyperdynamic state rather that aortic stenosis. Conclusion(s)/Recommendation(s): No evidence of valvular vegetations on this transthoracic echocardiogram. Consider a transesophageal echocardiogram to exclude infective endocarditis if clinically indicated. FINDINGS  Left Ventricle: Left ventricular ejection fraction, by estimation, is 70 to 75%. The left ventricle has hyperdynamic function. The left ventricle has no regional wall motion  abnormalities. The left ventricular internal cavity size was normal in size. There is no left ventricular hypertrophy.  Left ventricular diastolic parameters are indeterminate. Right Ventricle: The right ventricular size is mildly enlarged. No increase in right ventricular wall thickness. Right ventricular systolic function is hyperdynamic. There is moderately elevated pulmonary artery systolic pressure. The tricuspid regurgitant velocity is 3.24 m/s, and with an assumed right atrial pressure of 15 mmHg, the estimated right ventricular systolic pressure is 0000000 mmHg. Left Atrium: Left atrial size was normal in size. Right Atrium: Right atrial size was normal in size. Pericardium: There is no evidence of pericardial effusion. Mitral Valve: The mitral valve is normal in structure. No evidence of mitral valve regurgitation. No evidence of mitral valve stenosis. Tricuspid Valve: The tricuspid valve is normal in structure. Tricuspid valve regurgitation is trivial. No evidence of tricuspid stenosis. Aortic Valve: The aortic valve is tricuspid. There is mild calcification of the aortic valve. There is mild aortic valve annular calcification. Aortic valve regurgitation is trivial. Aortic valve mean gradient measures 17.0 mmHg. Aortic valve peak gradient measures 31.8 mmHg. Aortic valve area, by VTI measures 4.06 cm. Pulmonic Valve: The pulmonic valve was normal in structure. Pulmonic valve regurgitation is not visualized. No evidence of pulmonic stenosis. Aorta: The aortic root is normal in size and structure. Venous: The inferior vena cava is dilated in size with less than 50% respiratory variability, suggesting right atrial pressure of 15 mmHg. IAS/Shunts: The interatrial septum was not well visualized.  LEFT VENTRICLE PLAX 2D LVIDd:         5.60 cm LVIDs:         3.60 cm LV PW:         0.90 cm LV IVS:        0.90 cm LVOT diam:     2.60 cm LV SV:         153 LV SV Index:   67 LVOT Area:     5.31 cm  RIGHT VENTRICLE              IVC RV S prime:     17.20 cm/s  IVC diam: 2.60 cm TAPSE (M-mode): 2.4 cm LEFT ATRIUM           Index        RIGHT ATRIUM           Index LA diam:      3.60 cm 1.58 cm/m   RA Area:     18.40 cm LA Vol (A2C): 52.1 ml 22.89 ml/m  RA Volume:   49.40 ml  21.70 ml/m LA Vol (A4C): 48.4 ml 21.26 ml/m  AORTIC VALVE AV Area (Vmax):    3.22 cm AV Area (Vmean):   3.70 cm AV Area (VTI):     4.06 cm AV Vmax:           282.00 cm/s AV Vmean:          172.000 cm/s AV VTI:            0.377 m AV Peak Grad:      31.8 mmHg AV Mean Grad:      17.0 mmHg LVOT Vmax:         171.00 cm/s LVOT Vmean:        120.000 cm/s LVOT VTI:          0.288 m LVOT/AV VTI ratio: 0.76  AORTA Ao Root diam: 3.40 cm MITRAL VALVE                TRICUSPID VALVE MV Area (PHT): 4.09 cm     TR Peak grad:  42.0 mmHg MV Decel Time: 186 msec     TR Vmax:        324.00 cm/s MV E velocity: 135.67 cm/s MV A velocity: 91.80 cm/s   SHUNTS MV E/A ratio:  1.48         Systemic VTI:  0.29 m                             Systemic Diam: 2.60 cm Rudean Haskell MD Electronically signed by Rudean Haskell MD Signature Date/Time: 06/30/2022/12:05:25 PM    Final    DG Tibia/Fibula Right Port  Result Date: 06/30/2022 CLINICAL DATA:  Pain and swelling of the right lower extremity. EXAM: PORTABLE RIGHT TIBIA AND FIBULA - 2 VIEW COMPARISON:  Knee radiographs 09/27/2020 FINDINGS: Significant and markedly progressive knee joint degenerative changes. There also is a large knee joint effusion. This could be a progressive inflammatory/erosive arthropathy but could not exclude septic arthritis. Recommend correlation with clinical findings and joint aspiration as indicated. The tibia and fibula are intact. No destructive bony changes. Scattered vascular calcifications. The ankle joint is maintained. IMPRESSION: 1. Significant and markedly progressive knee joint degenerative changes and large knee joint effusion. Progressive inflammatory arthropathy versus septic  arthritis. Recommend correlation with clinical findings and joint aspiration as indicated. 2. Intact tibia and fibula.  No destructive bony changes. Electronically Signed   By: Marijo Sanes M.D.   On: 06/30/2022 09:48   DG Chest Port 1 View  Result Date: 06/30/2022 CLINICAL DATA:  Central line placement. EXAM: PORTABLE CHEST 1 VIEW COMPARISON:  06/30/2022, earlier same day FINDINGS: 0913 hours. Endotracheal tube tip is approximately 3.9 cm above the base of the carina. NG tube tip is in the stomach. Left IJ central line tip projects in the region of the innominate vein confluence and may be tenting the lateral wall of the vessel given the laterally directed position of the tip. Lung volumes remain low with right greater than left patchy diffuse airspace disease. The cardio pericardial silhouette is enlarged. Telemetry leads overlie the chest. IMPRESSION: Left IJ central line tip projects in the region of the innominate vein confluence and may be tenting the lateral wall of the vessel given the laterally directed position of the tip. No evidence for pneumothorax. Similar appearance of the right greater than left airspace disease. Electronically Signed   By: Misty Stanley M.D.   On: 06/30/2022 09:35   DG Chest Port 1 View  Result Date: 06/30/2022 CLINICAL DATA:  Possible sepsis, rule out aspiration. EXAM: PORTABLE CHEST 1 VIEW COMPARISON:  07/10/2022. FINDINGS: 0533 hours. Developing consolidation in the medial right lower lobe with patchy opacities throughout the right lung, favored to represent aspiration or multifocal pneumonia. New trace right pleural effusion. No pneumothorax. Stable cardiac and mediastinal contours, accounting for patient rotation. IMPRESSION: Developing consolidation in the medial right lower lobe with patchy opacities throughout the right lung, favored to represent aspiration or multifocal pneumonia. New trace right pleural effusion. Electronically Signed   By: Emmit Alexanders M.D.    On: 06/30/2022 08:16   [place] EKG SITE RITE  Result Date: 06/20/2022 If Site Rite image not attached, placement could not be confirmed due to current cardiac rhythm.  CT ABDOMEN PELVIS WO CONTRAST  Result Date: 07/07/2022 CLINICAL DATA:  Acute abdominal pain EXAM: CT ABDOMEN AND PELVIS WITHOUT CONTRAST TECHNIQUE: Multidetector CT imaging of the abdomen and pelvis was performed following the standard protocol without IV contrast. RADIATION DOSE  REDUCTION: This exam was performed according to the departmental dose-optimization program which includes automated exposure control, adjustment of the mA and/or kV according to patient size and/or use of iterative reconstruction technique. COMPARISON:  03/15/2021 FINDINGS: Lower chest: Blood pool is hypodense compared to the interventricular septum consistent with anemia. Scattered coronary calcifications. Trace pleural fluid. Scattered airspace opacities in the lung bases right greater than left, new since prior. Hepatobiliary: Interval TIPS. Small partially calcified stones layer in the dependent aspect of the gallbladder which is nondilated. No definite liver lesion or biliary ductal dilatation. Pancreas: Unremarkable. No pancreatic ductal dilatation or surrounding inflammatory changes. Spleen: Normal in size without focal abnormality. Adrenals/Urinary Tract: No adrenal mass. No urolithiasis or hydronephrosis. Symmetric renal contours. Urinary bladder decompressed by Foley catheter. Stomach/Bowel: Stomach is nondistended, unremarkable. Small bowel decompressed. Normal appendix. Colon is partially distended by gas and fecal material. Vascular/Lymphatic: Scattered aortoiliac calcified atheromatous plaque without aneurysm. TIPS projects in expected location. No abdominal or pelvic adenopathy. Reproductive: Prostate is unremarkable. Other: Trace pelvic fluid.  No abdominal ascites.  No free air. Musculoskeletal: Spondylitic changes throughout the visualized lower thoracic  and lumbosacral spine as before. No fracture or other acute finding. IMPRESSION: 1. Scattered airspace opacities in the lung bases right greater than left, new since prior. 2. Interval TIPS, resolution of abdominal ascites. 3. Cholelithiasis. 4. Coronary and aortic Atherosclerosis (ICD10-I70.0). Electronically Signed   By: Lucrezia Europe M.D.   On: 06/19/2022 16:26   CT Head Wo Contrast  Result Date: 07/02/2022 CLINICAL DATA:  Provided history: Mental status change, unknown cause. EXAM: CT HEAD WITHOUT CONTRAST TECHNIQUE: Contiguous axial images were obtained from the base of the skull through the vertex without intravenous contrast. RADIATION DOSE REDUCTION: This exam was performed according to the departmental dose-optimization program which includes automated exposure control, adjustment of the mA and/or kV according to patient size and/or use of iterative reconstruction technique. COMPARISON:  No pertinent prior exams available for comparison. FINDINGS: Brain: No age advanced or lobar predominant parenchymal atrophy. There is no acute intracranial hemorrhage. No demarcated cortical infarct. No extra-axial fluid collection. No evidence of an intracranial mass. No midline shift. Vascular: No hyperdense vessel. Skull: No mass or acute finding within the imaged orbits. Sinuses/Orbits: No mass or acute finding within the imaged orbits. Mild mucosal thickening within the bilateral maxillary, sphenoid and ethmoid sinuses. IMPRESSION: 1.  No evidence of an acute intracranial abnormality. 2. Mild paranasal sinus mucosal thickening. Electronically Signed   By: Kellie Simmering D.O.   On: 07/12/2022 16:23   DG Chest Port 1 View  Result Date: 07/18/2022 CLINICAL DATA:  Provided history: Weakness. Altered mental status. Hypoglycemia. EXAM: PORTABLE CHEST 1 VIEW COMPARISON:  Prior chest radiographs 09/07/2021 and earlier. FINDINGS: Heart size at the upper limits of normal. Small ill-defined opacity within the medial right  lung base, with an appearance favoring atelectasis. No appreciable airspace consolidation on the left. No evidence of pulmonary edema. No evidence of pleural effusion or pneumothorax. Degenerative changes of the spine. IMPRESSION: Small ill-defined opacity within the medial right lung base, with an appearance favoring atelectasis. Otherwise, no evidence of acute cardiopulmonary abnormality. Electronically Signed   By: Kellie Simmering D.O.   On: 07/13/2022 14:26   - pertinent xrays, CT, MRI studies were reviewed and independently interpreted  Positive ROS: All other systems have been reviewed and were otherwise negative with the exception of those mentioned in the HPI and as above.  Physical Exam: General: Alert, no acute distress Psychiatric: Patient is  competent for consent with normal mood and affect Lymphatic: No axillary or cervical lymphadenopathy Cardiovascular: No pedal edema Respiratory: No cyanosis, no use of accessory musculature GI: No organomegaly, abdomen is soft and non-tender    Images:  '@ENCIMAGES'$ @  Labs:  Lab Results  Component Value Date   ESRSEDRATE 110 (H) 11/21/2020   ESRSEDRATE 23 (H) 09/29/2020   ESRSEDRATE 3 09/27/2020   CRP 1.6 (H) 04/08/2021   CRP 2.1 (H) 04/07/2021   CRP 2.6 (H) 04/06/2021   REPTSTATUS PENDING 06/30/2022   GRAMSTAIN  06/30/2022    RARE WBC PRESENT, PREDOMINANTLY MONONUCLEAR RARE GRAM POSITIVE COCCI Performed at [facility], [place] [address], [place], [place] [address]    CULT RARE STAPHYLOCOCCUS AUREUS 06/30/2022   LABORGA STAPHYLOCOCCUS EPIDERMIDIS 07/25/2021    Lab Results  Component Value Date   ALBUMIN 2.6 (L) 07/01/2022   ALBUMIN 2.6 (L) 07/01/2022   ALBUMIN 2.1 (L) 06/30/2022        Latest Ref Rng & Units 07/01/2022   11:22 AM 07/01/2022    7:51 AM 07/01/2022    5:09 AM  CBC EXTENDED  WBC 4.0 - 10.5 K/uL   30.6   RBC 4.22 - 5.81 MIL/uL   2.10   Hemoglobin 13.0 - 17.0 g/dL 9.2  7.5  7.5   HCT 39.0 - 52.0 % 27.0   22.0  22.4   Platelets 150 - 400 K/uL   32     Neurologic: Patient does not have protective sensation bilateral lower extremities.   MUSCULOSKELETAL:   Skin: Examination patient has a chronic venous stasis ulcer lateral right leg there is pitting edema there is no cellulitis there is no tunneling no drainage no odor.  Patient has a dopplerable pulse.  Hemoglobin 9.2.  Albumin 2.6.  Assessment: Assessment: Chronic venous ulceration lateral right leg.  Plan: Plan: Patient will need dry dressing changes with compression.  I will write orders for 4 x 4 gauze and Ace wrap to be changed daily.  Thank you for the consult and the opportunity to see Mr. Gwinda Passe, MD [facility] [phone] 12:36 PM

## 2022-07-01 NOTE — Progress Notes (Signed)
Patient began to become hypotenisve and went into v-tach rhythm.  CCM NP Jerene Pitch was called to room.  Crash cart was brought to room and patient was hooked up to defib pads and hard board was placed under his back.  RN gave calcium gluconate and 150 mg amiodarone bolus.  Cardiologist Wilhemina Cash was in room and ordered 200 joule shock and was administered at 0858.  Patient converted to NSR but 2 minutes later began to go back into v-tach. A second 200 joule shock was ordered by cardiologist and given at 0900.  '2mg'$  of IV versed were given.  2nd amiodarone bolus was administered per orders.

## 2022-07-02 ENCOUNTER — Inpatient Hospital Stay (HOSPITAL_COMMUNITY): Payer: Medicaid Other

## 2022-07-02 DIAGNOSIS — I87331 Chronic venous hypertension (idiopathic) with ulcer and inflammation of right lower extremity: Secondary | ICD-10-CM | POA: Diagnosis not present

## 2022-07-02 DIAGNOSIS — R6521 Severe sepsis with septic shock: Secondary | ICD-10-CM

## 2022-07-02 DIAGNOSIS — N179 Acute kidney failure, unspecified: Secondary | ICD-10-CM | POA: Diagnosis not present

## 2022-07-02 DIAGNOSIS — A419 Sepsis, unspecified organism: Secondary | ICD-10-CM

## 2022-07-02 DIAGNOSIS — M00061 Staphylococcal arthritis, right knee: Secondary | ICD-10-CM | POA: Diagnosis not present

## 2022-07-02 DIAGNOSIS — D689 Coagulation defect, unspecified: Secondary | ICD-10-CM

## 2022-07-02 DIAGNOSIS — B9562 Methicillin resistant Staphylococcus aureus infection as the cause of diseases classified elsewhere: Secondary | ICD-10-CM | POA: Diagnosis not present

## 2022-07-02 DIAGNOSIS — R7881 Bacteremia: Secondary | ICD-10-CM | POA: Diagnosis not present

## 2022-07-02 DIAGNOSIS — Z7189 Other specified counseling: Secondary | ICD-10-CM

## 2022-07-02 DIAGNOSIS — E875 Hyperkalemia: Secondary | ICD-10-CM

## 2022-07-02 LAB — RENAL FUNCTION PANEL
Albumin: 2.7 g/dL — ABNORMAL LOW (ref 3.5–5.0)
Albumin: 2.3 g/dL — ABNORMAL LOW (ref 3.5–5.0)
Anion gap: 11 (ref 5–15)
Anion gap: 8 (ref 5–15)
BUN: 35 mg/dL — ABNORMAL HIGH (ref 6–20)
BUN: 34 mg/dL — ABNORMAL HIGH (ref 6–20)
CO2: 21 mmol/L — ABNORMAL LOW (ref 22–32)
CO2: 21 mmol/L — ABNORMAL LOW (ref 22–32)
Calcium: 7.8 mg/dL — ABNORMAL LOW (ref 8.9–10.3)
Calcium: 7.8 mg/dL — ABNORMAL LOW (ref 8.9–10.3)
Chloride: 98 mmol/L (ref 98–111)
Chloride: 99 mmol/L (ref 98–111)
Creatinine, Ser: 2.82 mg/dL — ABNORMAL HIGH (ref 0.61–1.24)
Creatinine, Ser: 2.48 mg/dL — ABNORMAL HIGH (ref 0.61–1.24)
GFR, Estimated: 25 mL/min — ABNORMAL LOW (ref >60)
GFR, Estimated: 29 mL/min — ABNORMAL LOW (ref >60)
Glucose, Bld: 98 mg/dL (ref 70–99)
Glucose, Bld: 119 mg/dL — ABNORMAL HIGH (ref 70–99)
Phosphorus: 5.5 mg/dL — ABNORMAL HIGH (ref 2.5–4.6)
Phosphorus: 4 mg/dL (ref 2.5–4.6)
Potassium: 4.7 mmol/L (ref 3.5–5.1)
Potassium: 5.4 mmol/L — ABNORMAL HIGH (ref 3.5–5.1)
Sodium: 130 mmol/L — ABNORMAL LOW (ref 135–145)
Sodium: 128 mmol/L — ABNORMAL LOW (ref 135–145)

## 2022-07-02 LAB — DIC (DISSEMINATED INTRAVASCULAR COAGULATION)PANEL
D-Dimer, Quant: >20 ug/mL-FEU — ABNORMAL HIGH (ref 0.00–0.50)
Fibrinogen: 148 mg/dL — ABNORMAL LOW (ref 210–475)
INR: 2.8 — ABNORMAL HIGH (ref 0.8–1.2)
Platelets: 25 10*3/uL — CL (ref 150–400)
Prothrombin Time: 29.5 seconds — ABNORMAL HIGH (ref 11.4–15.2)
Smear Review: NONE SEEN
aPTT: 64 seconds — ABNORMAL HIGH (ref 24–36)

## 2022-07-02 LAB — CULTURE, BLOOD (ROUTINE X 2): Special Requests: ADEQUATE

## 2022-07-02 LAB — HEPATIC FUNCTION PANEL
ALT: 46 U/L — ABNORMAL HIGH (ref 0–44)
AST: 165 U/L — ABNORMAL HIGH (ref 15–41)
Albumin: 2.8 g/dL — ABNORMAL LOW (ref 3.5–5.0)
Alkaline Phosphatase: 343 U/L — ABNORMAL HIGH (ref 38–126)
Bilirubin, Direct: 8.3 mg/dL — ABNORMAL HIGH (ref 0.0–0.2)
Indirect Bilirubin: 8.5 mg/dL — ABNORMAL HIGH (ref 0.3–0.9)
Total Bilirubin: 16.8 mg/dL — ABNORMAL HIGH (ref 0.3–1.2)
Total Protein: 6.8 g/dL (ref 6.5–8.1)

## 2022-07-02 LAB — SYNOVIAL CELL COUNT + DIFF, W/ CRYSTALS
Crystals, Fluid: NONE SEEN
Eosinophils-Synovial: 0 % (ref 0–1)
Lymphocytes-Synovial Fld: 39 % — ABNORMAL HIGH (ref 0–20)
Monocyte-Macrophage-Synovial Fluid: 20 % — ABNORMAL LOW (ref 50–90)
Neutrophil, Synovial: 41 % — ABNORMAL HIGH (ref 0–25)
WBC, Synovial: 35000 /mm3 — ABNORMAL HIGH (ref 0–200)

## 2022-07-02 LAB — CBC
HCT: 26.9 % — ABNORMAL LOW (ref 39.0–52.0)
Hemoglobin: 9.1 g/dL — ABNORMAL LOW (ref 13.0–17.0)
MCH: 36.4 pg — ABNORMAL HIGH (ref 26.0–34.0)
MCHC: 33.8 g/dL (ref 30.0–36.0)
MCV: 107.6 fL — ABNORMAL HIGH (ref 80.0–100.0)
Platelets: 25 10*3/uL — CL (ref 150–400)
RBC: 2.5 MIL/uL — ABNORMAL LOW (ref 4.22–5.81)
RDW: 17.1 % — ABNORMAL HIGH (ref 11.5–15.5)
WBC: 31 10*3/uL — ABNORMAL HIGH (ref 4.0–10.5)
nRBC: 2.8 % — ABNORMAL HIGH (ref 0.0–0.2)

## 2022-07-02 LAB — GLUCOSE, CAPILLARY
Glucose-Capillary: 100 mg/dL — ABNORMAL HIGH (ref 70–99)
Glucose-Capillary: 93 mg/dL (ref 70–99)
Glucose-Capillary: 111 mg/dL — ABNORMAL HIGH (ref 70–99)
Glucose-Capillary: 104 mg/dL — ABNORMAL HIGH (ref 70–99)
Glucose-Capillary: 112 mg/dL — ABNORMAL HIGH (ref 70–99)
Glucose-Capillary: 126 mg/dL — ABNORMAL HIGH (ref 70–99)

## 2022-07-02 LAB — CULTURE, RESPIRATORY W GRAM STAIN

## 2022-07-02 LAB — AMMONIA: Ammonia: 152 umol/L — ABNORMAL HIGH (ref 9–35)

## 2022-07-02 LAB — PROTIME-INR
INR: 2.7 — ABNORMAL HIGH (ref 0.8–1.2)
Prothrombin Time: 28.2 seconds — ABNORMAL HIGH (ref 11.4–15.2)

## 2022-07-02 LAB — MAGNESIUM: Magnesium: 2.8 mg/dL — ABNORMAL HIGH (ref 1.7–2.4)

## 2022-07-02 LAB — VANCOMYCIN, RANDOM: Vancomycin Rm: 13 ug/mL

## 2022-07-02 MED ORDER — LIDOCAINE HCL (PF) 1 % IJ SOLN
INTRAMUSCULAR | Status: AC
  Administered 2022-07-02: 30 mL
  Filled 2022-07-02: qty 30

## 2022-07-02 MED ORDER — HYDROCORTISONE SOD SUC (PF) 100 MG IJ SOLR
50.0000 mg | Freq: Four times a day (QID) | INTRAMUSCULAR | Status: DC
  Administered 2022-07-02 – 2022-07-03 (×4): 50 mg via INTRAVENOUS
  Filled 2022-07-02 (×4): qty 2

## 2022-07-02 MED ORDER — PRISMASOL BGK 0/2.5 32-2.5 MEQ/L EC SOLN
Status: DC
  Filled 2022-07-02 (×3): qty 5000

## 2022-07-02 NOTE — Progress Notes (Signed)
Brief Update K 5.4 on PM CRRT labs. All 4K bath. Move post dialysate to 2K.

## 2022-07-02 NOTE — Progress Notes (Signed)
Subjective:  remains unstable overnight,   ended up positive fluid wise overnight due to that instability-  basically no UOP  Objective Vital signs in last 24 hours: Vitals:   07/02/22 0645 07/02/22 0700 07/02/22 0757 07/02/22 0800  BP:  (!) 119/59  (!) 115/56  Pulse: 73 73 75 71  Resp: (!) 24 (!) 24 (!) 24 (!) 24  Temp: 98.2 F (36.8 C) 98.2 F (36.8 C) 98.2 F (36.8 C) 98.2 F (36.8 C)  TempSrc:      SpO2: 96% 97% 97% 96%  Weight:      Height:       Weight change: 1.6 kg  Intake/Output Summary (Last 24 hours) at 07/02/2022 [id number] Last data filed at 07/02/2022 0800 Gross per 24 hour  Intake 4457.94 ml  Output 4346.3 ml  Net 111.64 ml     Assessment/Plan: 62 year old WM with cirrhosis but well compensated-  found down-  drugs in system as well as bacteremia thought due to leg wound- sepsis and shock 1.Renal- baseline crt 0.9 in December of 23. Now with AKI in the setting of septic shock due to bacteremia- likely ATN-  also pre admit mobic.  BP suppoorted with pressors and bacteremia being treated.  However, renal function worsening and has developed pretty significant hyperkalemia with metabolic acidosis.  Decision is made to support with CRRT - started 3/12 -   2 k bath, no heparin and no volume removal at first-  now attempting UF as able but has not been that successful due to hemodynamic instability. Changed to 4 K bath on 3/13.  Continue CRRT fot now-  per nursing family discussions are being held due to lack of significant improvement 2. Hypertension/volume  - hypotensive req pressors-  mildly fluid overloaded-  no UF at first with CRRT-  now positive-  will try to UF as able-  order is for 50-100 per hour-  has been limited by low BPS 3. Hyperkalemia-  has been medically treated-  then CRRT-  2 K bath-  improving-  changed to 4 k baths yesterday  4. Anemia  - supportive care for now but I have added ESA 5. Hypocalcemia-  s/p iv dosing in past-  corrected calcium is better  6.  Acidosis- CRRT will help correct- is doing    Louis Meckel    Labs: Basic Metabolic Panel: Recent Labs  Lab 07/01/22 0509 07/01/22 0751 07/01/22 1122 07/01/22 1535 07/02/22 0421  NA 128*   < > 131* 129* 130*  K 5.0   < > 4.6 5.3* 4.7  CL 96*  --   --  97* 98  CO2 23  --   --  22 21*  GLUCOSE 127*  --   --  120* 98  BUN 48*  --   --  41* 35*  CREATININE 3.92*  --   --  3.22* 2.82*  CALCIUM 7.1*  --   --  7.4* 7.8*  PHOS 5.8*  5.8*  --   --  5.3* 5.5*   < > = values in this interval not displayed.   Liver Function Tests: Recent Labs  Lab 06/30/22 1015 07/01/22 0509 07/01/22 1535 07/02/22 0421  AST 145* 145*  --  165*  ALT 39 39  --  46*  ALKPHOS 305* 283*  --  343*  BILITOT 9.9* 12.5*  --  16.8*  PROT 5.3* 5.8*  --  6.8  ALBUMIN 2.1* 2.6*  2.6* 2.6* 2.8*  2.7*  No results for input(s): "LIPASE", "AMYLASE" in the last 168 hours. Recent Labs  Lab 07/09/2022 1353 06/30/22 1245 07/02/22 0421  AMMONIA 74* 65* 152*   CBC: Recent Labs  Lab 06/28/2022 1353 06/27/2022 1409 06/30/22 0358 06/30/22 0946 06/30/22 1225 07/01/22 0509 07/01/22 0751 07/01/22 1122 07/02/22 0421  WBC 20.2*  --  36.8*  --   --  30.6*  --   --  31.0*  NEUTROABS 18.6*  --   --   --   --   --   --   --   --   HGB 7.9*   < > 7.6*   < >  --  7.5* 7.5* 9.2* 9.1*  HCT 23.8*   < > 21.7*   < >  --  22.4* 22.0* 27.0* 26.9*  MCV 108.2*  --  103.3*  --   --  106.7*  --   --  107.6*  PLT 40*  --  32*  --  59* 32*  --   --  25*   < > = values in this interval not displayed.   Cardiac Enzymes: Recent Labs  Lab 06/27/2022 1353  CKTOTAL 391   CBG: Recent Labs  Lab 07/01/22 1619 07/01/22 1912 07/01/22 2317 07/02/22 0316 07/02/22 0728  GLUCAP 112* 130* 111* 100* 93    Iron Studies: No results for input(s): "IRON", "TIBC", "TRANSFERRIN", "FERRITIN" in the last 72 hours. Studies/Results: DG Chest Port 1 View  Result Date: 07/02/2022 CLINICAL DATA:  Respiratory failure. EXAM:  PORTABLE CHEST 1 VIEW COMPARISON:  July 01, 2022 FINDINGS: EKG leads project over the chest. Endotracheal tube terminates approximately 3.8 cm from the carina, tip between clavicular heads. LEFT-sided central venous access device terminates at the area of the brachiocephalic junction in the upper SVC. RIGHT vas cath terminates in the area the caval to atrial junction. Gastric tube courses through in off the field of the radiograph tip in the LEFT upper quadrant. Cardiomediastinal contours and hilar structures are stable. No pneumothorax. Interstitial and alveolar opacities in the chest are similar to prior imaging. On limited assessment no acute skeletal findings. IMPRESSION: 1. No significant interval change in the appearance of the chest. 2. Support lines and tubes as described. 3. Bilateral interstitial and alveolar/airspace disease in the chest are similar to prior imaging. Electronically Signed   By: Zetta Bills M.D.   On: 07/02/2022 08:01   DG Chest Port 1 View  Result Date: 07/01/2022 CLINICAL DATA:  Intubation, sepsis, respiratory failure EXAM: PORTABLE CHEST 1 VIEW COMPARISON:  Portable exam 0525 hours compared to 06/30/2022 FINDINGS: Tip of endotracheal tube projects 3.0 cm above carina. Nasogastric tube coiled in proximal stomach. BILATERAL jugular lines with tips projecting over SVC. Normal heart size and mediastinal contours. Patchy BILATERAL airspace infiltrates again seen. No pleural effusion or pneumothorax. IMPRESSION: Persistent BILATERAL pulmonary infiltrates. Electronically Signed   By: Lavonia Dana M.D.   On: 07/01/2022 07:40   DG CHEST PORT 1 VIEW  Result Date: 06/30/2022 CLINICAL DATA:  Line placement for dialysis, intubation EXAM: PORTABLE CHEST 1 VIEW COMPARISON:  Portable exam 1240 hours compared to 0913 hours FINDINGS: Tip of endotracheal tube projects 2.5 cm above carina. Nasogastric tube coiled in proximal stomach. LEFT jugular line tip projects over SVC. New dual lumen RIGHT  jugular central venous catheter with tip projecting over SVC. Enlargement of cardiac silhouette with vascular congestion. BILATERAL pulmonary infiltrates favor pulmonary edema. No pleural effusion or pneumothorax. IMPRESSION: No pneumothorax following RIGHT jugular line placement. Enlargement  of cardiac silhouette with pulmonary vascular congestion and diffuse infiltrates most consistent with pulmonary edema. Electronically Signed   By: Lavonia Dana M.D.   On: 06/30/2022 12:54   ECHOCARDIOGRAM COMPLETE  Result Date: 06/30/2022    ECHOCARDIOGRAM REPORT   Patient Name:   BECKAM LOBELLO Date of Exam: 06/30/2022 Medical Rec #:  [id number]       Height:       72.0 in Accession #:    [id number]      Weight:       233.7 lb Date of Birth:  03/16/1961       BSA:          2.276 m Patient Age:    62 years        BP:           115/50 mmHg Patient Gender: M               HR:           86 bpm. Exam Location:  Inpatient Procedure: 2D Echo, Cardiac Doppler and Color Doppler STAT ECHO Indications:    Bacteremia. Shock  History:        Patient has prior history of Echocardiogram examinations, most                 recent 08/27/2021. Abnormal ECG, Arrythmias:Atrial Fibrillation;                 Signs/Symptoms:Bacteremia.  Sonographer:    Roseanna Rainbow RDCS Referring Phys: [id number] PAULA B SIMPSON  Sonographer Comments: Technically difficult study due to poor echo windows and echo performed with patient supine and on artificial respirator. STAT echo for shock. Study rushed for central line placement. IMPRESSIONS  1. Left ventricular ejection fraction, by estimation, is 70 to 75%. The left ventricle has hyperdynamic function. The left ventricle has no regional wall motion abnormalities. Left ventricular diastolic parameters are indeterminate.  2. Right ventricular systolic function is hyperdynamic. The right ventricular size is mildly enlarged. There is moderately elevated pulmonary artery systolic pressure.  3. The mitral valve is normal in  structure. No evidence of mitral valve regurgitation. No evidence of mitral stenosis.  4. The aortic valve is tricuspid. There is mild calcification of the aortic valve. Aortic valve regurgitation is trivial.  5. The inferior vena cava is dilated in size with <50% respiratory variability, suggesting right atrial pressure of 15 mmHg.  6. Elvated aortic flows reflect hyperdynamic state rather that aortic stenosis. Conclusion(s)/Recommendation(s): No evidence of valvular vegetations on this transthoracic echocardiogram. Consider a transesophageal echocardiogram to exclude infective endocarditis if clinically indicated. FINDINGS  Left Ventricle: Left ventricular ejection fraction, by estimation, is 70 to 75%. The left ventricle has hyperdynamic function. The left ventricle has no regional wall motion abnormalities. The left ventricular internal cavity size was normal in size. There is no left ventricular hypertrophy. Left ventricular diastolic parameters are indeterminate. Right Ventricle: The right ventricular size is mildly enlarged. No increase in right ventricular wall thickness. Right ventricular systolic function is hyperdynamic. There is moderately elevated pulmonary artery systolic pressure. The tricuspid regurgitant velocity is 3.24 m/s, and with an assumed right atrial pressure of 15 mmHg, the estimated right ventricular systolic pressure is 0000000 mmHg. Left Atrium: Left atrial size was normal in size. Right Atrium: Right atrial size was normal in size. Pericardium: There is no evidence of pericardial effusion. Mitral Valve: The mitral valve is normal in structure. No evidence of mitral valve regurgitation. No evidence of mitral  valve stenosis. Tricuspid Valve: The tricuspid valve is normal in structure. Tricuspid valve regurgitation is trivial. No evidence of tricuspid stenosis. Aortic Valve: The aortic valve is tricuspid. There is mild calcification of the aortic valve. There is mild aortic valve annular  calcification. Aortic valve regurgitation is trivial. Aortic valve mean gradient measures 17.0 mmHg. Aortic valve peak gradient measures 31.8 mmHg. Aortic valve area, by VTI measures 4.06 cm. Pulmonic Valve: The pulmonic valve was normal in structure. Pulmonic valve regurgitation is not visualized. No evidence of pulmonic stenosis. Aorta: The aortic root is normal in size and structure. Venous: The inferior vena cava is dilated in size with less than 50% respiratory variability, suggesting right atrial pressure of 15 mmHg. IAS/Shunts: The interatrial septum was not well visualized.  LEFT VENTRICLE PLAX 2D LVIDd:         5.60 cm LVIDs:         3.60 cm LV PW:         0.90 cm LV IVS:        0.90 cm LVOT diam:     2.60 cm LV SV:         153 LV SV Index:   67 LVOT Area:     5.31 cm  RIGHT VENTRICLE             IVC RV S prime:     17.20 cm/s  IVC diam: 2.60 cm TAPSE (M-mode): 2.4 cm LEFT ATRIUM           Index        RIGHT ATRIUM           Index LA diam:      3.60 cm 1.58 cm/m   RA Area:     18.40 cm LA Vol (A2C): 52.1 ml 22.89 ml/m  RA Volume:   49.40 ml  21.70 ml/m LA Vol (A4C): 48.4 ml 21.26 ml/m  AORTIC VALVE AV Area (Vmax):    3.22 cm AV Area (Vmean):   3.70 cm AV Area (VTI):     4.06 cm AV Vmax:           282.00 cm/s AV Vmean:          172.000 cm/s AV VTI:            0.377 m AV Peak Grad:      31.8 mmHg AV Mean Grad:      17.0 mmHg LVOT Vmax:         171.00 cm/s LVOT Vmean:        120.000 cm/s LVOT VTI:          0.288 m LVOT/AV VTI ratio: 0.76  AORTA Ao Root diam: 3.40 cm MITRAL VALVE                TRICUSPID VALVE MV Area (PHT): 4.09 cm     TR Peak grad:   42.0 mmHg MV Decel Time: 186 msec     TR Vmax:        324.00 cm/s MV E velocity: 135.67 cm/s MV A velocity: 91.80 cm/s   SHUNTS MV E/A ratio:  1.48         Systemic VTI:  0.29 m                             Systemic Diam: 2.60 cm Rudean Haskell MD Electronically signed by Rudean Haskell MD Signature Date/Time: 06/30/2022/12:05:25 PM    Final  DG Tibia/Fibula Right Port  Result Date: 06/30/2022 CLINICAL DATA:  Pain and swelling of the right lower extremity. EXAM: PORTABLE RIGHT TIBIA AND FIBULA - 2 VIEW COMPARISON:  Knee radiographs 09/27/2020 FINDINGS: Significant and markedly progressive knee joint degenerative changes. There also is a large knee joint effusion. This could be a progressive inflammatory/erosive arthropathy but could not exclude septic arthritis. Recommend correlation with clinical findings and joint aspiration as indicated. The tibia and fibula are intact. No destructive bony changes. Scattered vascular calcifications. The ankle joint is maintained. IMPRESSION: 1. Significant and markedly progressive knee joint degenerative changes and large knee joint effusion. Progressive inflammatory arthropathy versus septic arthritis. Recommend correlation with clinical findings and joint aspiration as indicated. 2. Intact tibia and fibula.  No destructive bony changes. Electronically Signed   By: Marijo Sanes M.D.   On: 06/30/2022 09:48   DG Chest Port 1 View  Result Date: 06/30/2022 CLINICAL DATA:  Central line placement. EXAM: PORTABLE CHEST 1 VIEW COMPARISON:  06/30/2022, earlier same day FINDINGS: 0913 hours. Endotracheal tube tip is approximately 3.9 cm above the base of the carina. NG tube tip is in the stomach. Left IJ central line tip projects in the region of the innominate vein confluence and may be tenting the lateral wall of the vessel given the laterally directed position of the tip. Lung volumes remain low with right greater than left patchy diffuse airspace disease. The cardio pericardial silhouette is enlarged. Telemetry leads overlie the chest. IMPRESSION: Left IJ central line tip projects in the region of the innominate vein confluence and may be tenting the lateral wall of the vessel given the laterally directed position of the tip. No evidence for pneumothorax. Similar appearance of the right greater than left  airspace disease. Electronically Signed   By: Misty Stanley M.D.   On: 06/30/2022 09:35   Medications: Infusions:   prismasol BGK 4/2.5 400 mL/hr at 07/02/22 0423    prismasol BGK 4/2.5 400 mL/hr at 07/02/22 0426   sodium chloride     sodium chloride     amiodarone 30 mg/hr (07/02/22 0801)   dextrose 20 mL/hr at 07/02/22 0800   fentaNYL infusion INTRAVENOUS 150 mcg/hr (07/02/22 0800)   norepinephrine (LEVOPHED) Adult infusion 15 mcg/min (07/02/22 0801)   phenylephrine (NEO-SYNEPHRINE) Adult infusion 400 mcg/min (07/02/22 0800)   prismasol BGK 4/2.5 1,500 mL/hr at 07/02/22 0803   vancomycin Stopped (07/01/22 1301)   vasopressin 0.03 Units/min (07/02/22 0800)    Scheduled Medications:  amiodarone  150 mg Intravenous Once   Chlorhexidine Gluconate Cloth  6 each Topical Daily   docusate  100 mg Per Tube BID   feeding supplement (PROSource TF20)  60 mL Per Tube TID   feeding supplement (VITAL 1.5 CAL)  1,000 mL Per Tube [id number]   folic acid  1 mg Per Tube Daily   lactulose  30 g Per Tube TID   leptospermum manuka honey  1 Application Topical Daily   multivitamin  1 tablet Per Tube QHS   mouth rinse  15 mL Mouth Rinse Q2H   pantoprazole (PROTONIX) IV  40 mg Intravenous Q12H   polyethylene glycol  17 g Per Tube Daily   thiamine  100 mg Per Tube Daily    have reviewed scheduled and prn medications.  Physical Exam: General: sedated on vent Heart: RRR Lungs: CBS bilat Abdomen: distended  Extremities: pitting edema  Dialysis Access: right IJ vascath placed 3/12     07/02/2022,8:33 AM  LOS: 3 days

## 2022-07-02 NOTE — IPAL (Addendum)
   Interdisciplinary Goals of Care Family Meeting   Date carried out: 07/02/2022  Location of the meeting: Bedside  Member's involved: Nurse Practitioner, Bedside Registered Nurse, and Family Member or next of kin  Durable Power of Attorney or acting medical decision maker: sons Barnet Pall and [place]     Discussion: We discussed goals of care for [employer]   Oldest son Barnet Pall and his wife Janett Billow at bedside.   Discussed this unfortunate and critical case-- septic shock w MSOF on multiple pressors, superimposed on significant chronic conditions  I conveyed that pts prognosis is grim.   We plan to meet July 04, 2022 10:00 to discuss goals of care moving forward. In interim, I have encouraged Barnet Pall to discuss code status with his brother Ernestine Mcmurray. If pt were to arrest despite present measures, I do not think chest compressions would offer overall benefit to his prognosis & have high potential to facilitate harm (bleeding, possible fx)    If colin comes to the hospital today, will plan to talk w him about the above as well   For now remains full code   Code status:   Code Status: Full Code   Disposition: Continue current acute care  Time spent for the meeting: 20 min    Addendum:   Met  with son Ernestine Mcmurray and, w Colin's permission,  pts uncle (who is like a brother to the pt) at bedside after their arrival  We talked about the above -- critical illness: septic shock (disseminated infection) with MSOF. Talked about current aggressive tx measures. I conveyed that it is exceedingly unlikely that pt will survive.   Recommend that brothers discuss code status and consider DNR-- If pt arrests despite all current measures, I do not believe chest compressions will provide benefit to overall prognosis and have high likelihood to cause degree of harm.  We talked about plan for mtg tomorrow at 10:00 regarding broader goals and care. At family request, spent some time discussing what transition to  comfort care would entail.   All questions answered, emotional support provided.   Add'l time 35 minutes   Eliseo Gum MSN, AGACNP-BC [place] for pager 07/02/2022, 1:16 PM

## 2022-07-02 NOTE — Procedures (Signed)
Diagnostic Arthrocentesis Procedure Note  Kyle Pitts  [id number]  02/01/1961  Date:07/02/22  Time:11:23 AM   Provider Performing:Nimesh Riolo E Gwynneth Fabio    Procedure: Diagnostic Arthrocentesis  Indication(s) Septic arthritis   Consent Risks of the procedure as well as the alternatives and risks of each were explained to the patient and/or caregiver.  Consent for the procedure was obtained and is signed in the bedside chart  Anesthesia Topical only with 1% lidocaine    Time Out Verified patient identification, verified procedure, site/side was marked, verified correct patient position, special equipment/implants available, medications/allergies/relevant history reviewed, required imaging and test results available.   Sterile Technique Maximal sterile technique including full sterile barrier drape, hand hygiene, sterile gown, sterile gloves, mask, hair covering, sterile ultrasound probe cover (if used).   Procedure Description Ultrasound used to identify appropriate right patellar anatomy and associated complex appearing effusion--overlying skin marked for procedure.  Area of drainage cleaned in sterile fashion. Lidocaine was used to anesthetize the skin and subcutaneous tissue.  A 16g IV was inserted into the identified right knee effusion. 25 cc's of murky, opaque, red fluid was drained and placed into sterile specimen container. Catheter then removed and bandaid applied to site. Specimen to be sent for cell count & diff, cx.    Complications/Tolerance None; patient tolerated the procedure well.  EBL Minimal  Specimen(s) Right knee synovial fluid   Eliseo Gum MSN, AGACNP-BC [place] for pager  07/02/2022, 11:30 AM

## 2022-07-02 NOTE — Progress Notes (Addendum)
NAME:  Kyle Pitts, MRN:  [id number], DOB:  [birth year], LOS: 3 ADMISSION DATE:  07/16/2022, CONSULTATION DATE:  3/11 REFERRING MD:  Dr. Francia Greaves, CHIEF COMPLAINT:  AMS   History of Present Illness:  62 y/o M who presented to [facility] ER on 3/11 after being found down at home.    Per report, his sister found him down at home. The patient reportedly cares for his sister. Unknown total down time.  EMS called and found his glucose to be 20-30's. Initial ER evaluation notable for multiple metabolic derangements (Na 122, K 5.6, CO2 17, BUn 61/Cr 5.42, iCA 0.84, lactic acid 5.9, WBC 20, Hgb 7.9, platelets 40, and troponin 3,200), hypothermia but stable hemodynamics. COVID/Flu A&B / RSV negative. UDS positive for cocaine & opiates.  He was altered but somewhat interactive on arrival.  He was sent for CT head which was negative for acute process. Additionally, he had a CT ABD that showed scattered airspace opacities in the lung bases R>L, interval TIPS with resolution of the abdominal ascites. CXR showed a small ill-defined opacity in the medial right lung base, suspected to be atelectasis. He was treated with IVF, empiric antibiotics, and pan cultured.  After returning from CT he was hypotensive with SBP in the 60's.  He was given an additional 500 ml NS bolus, additional 50 mEq of sodium bicarbonate and calcium gluconate.   PCCM called for ICU admission.   Pertinent  Medical History  Anemia  ETOH Cirrhosis s/p TIPS Peptic Ulcers - gastric & duodenal  Esophageal Varices  Hyponatremia  Allergic Reaction  Septic Arthritis  Cataracts Group A Strep Infection 2022  Significant Hospital Events: Including procedures, antibiotic start and stop dates in addition to other pertinent events   3/11 Admit after being found down, admitted with septic shock, RLL PNA, RLE wound, AKI, AMS, multiple metabolic derangements and coagulopathy  3/12 intubated due poor mental status, worsening shock, multiple pressors, CRRT.  Cardioverted for VT  3/13 Cards consulted for VT. 3/14 remains on neo ne vaso amio.   Interim History / Subjective:  Worse thrombocytopenia, hyperammonemia   Maintaining sinus   Objective   Blood pressure 122/62, pulse 74, temperature 98.4 F (36.9 C), resp. rate (!) 24, height 6' (1.829 m), weight 107.6 kg, SpO2 97 %. CVP:  [7 mmHg-17 mmHg] 15 mmHg  Vent Mode: PRVC FiO2 (%):  [50 %-60 %] 60 % Set Rate:  [24 bmp] 24 bmp Vt Set:  [620 mL] 620 mL PEEP:  [8 cmH20] 8 cmH20 Plateau Pressure:  [24 cmH20-29 cmH20] 25 cmH20   Intake/Output Summary (Last 24 hours) at 07/02/2022 1024 Last data filed at 07/02/2022 1004 Gross per 24 hour  Intake 4300.17 ml  Output 4366.6 ml  Net -66.43 ml   Filed Weights   06/30/22 0706 07/01/22 0500 07/02/22 0500  Weight: 106 kg 109.2 kg 107.6 kg   Examination: General:  Chronically and critically ill appearing M intubated  Neuro: Sedated. Does not follow commands HEENNT: ETT secure, icteric sclera, brown tinged oral secretions. R IJ HD cath, L IJ CVC  CV: rr cap refill is brisk  PULM: Mechanically ventilated, scattered rhonchi and crackles  GI: round, sm midline soft mass superior to umbilicus  GU: foley  Extremities: R rad art line. Edematous BUE BLE -- RLE > LLE. R knee effusion.   Skin: Jaundice. Scattered telangiectasias. erythematous hands. RLE wound w dressing intact.    Resolved Hospital Problem list     Assessment & Plan:  GOC -grim prognosis -plan for fam mtg 3/15 mid morning to discuss further -have encouraged sons to discuss code status in interim -- in event of arrest despite present measures, I do not believe that chest compressions will offer overall benefit and have likelihood to cause degree of harm   Acute encephalopathy -HE, sepsis w MSOF  P -RASS goal 0  -tx as below  Acute hypoxic resp failure  -PNA, pulm edema P -cont MV support, wean as able -VAP, pulm hygiene -PRN CXR ABG   Septic shock with multisystem  organ failure due to MRSA bacteremia, PNA, likely septic arthritis R knee   Chronic RLE wound (in setting of prior I&D GAS septic arthritis R knee.)  P -ID following, narrowed to vanc  -MAP goal >65 dont have a lot of room to escalate from here-- is on max neo + .03 vaso and mid range NE. Will add solucortef  -not stable enough to go for imaging of R knee, but presume effusion is infected -- will see if can get a joint aspiration done at bedside. If so, ultimately would need op mgmnt, at this point wouldn't be stable for case    VT / SVT  Demand ischemia  -in setting of septic shock + MSOF  -was CV x2 3/13 and started on amio P -cards following -cont on amio gtt for now  -optimize lytes   Decompensated alcoholic cirrhosis  Portal HTN, G1 esophageal varices s/p TIPS Thrombocytopenia Anemia  Coagulopathy  Hyperammonemia Hyperbilirubinemia  Hypoglycemia  -admission MELD-Na 40  P -following LFTs, coags, ammonia -will send DIC panel today +/- schistos might help differentiate some of above from sequelae of septic shock, wont impact mgmnt much however  -cont on D10 -- maybe solucortef will help some -cont lactulose -maintain active t&s -- no indication for product at present   Acute renal failure  NAGMA Hyponatremia  Hyperphosphatemia  Hypocalcemia  -nb is on D10 for hypoglycemia  P -CRRT per nephro -follow renal indices and UOP   Polysubstance abuse  -cessation support if survives   Best Practice (right click and "Reselect all SmartList Selections" daily)  Diet/type: NPO; EN  DVT prophylaxis: SCD GI prophylaxis: PPI Lines: Central line, Dialysis Catheter, Arterial Line, and yes and it is still needed Foley:  Yes, and it is still needed Code Status:  full code Last date of multidisciplinary goals of care discussion:  3/14  Labs   CBC: Recent Labs  Lab 07/05/2022 1353 06/25/2022 1409 06/30/22 0358 06/30/22 0946 06/30/22 1158 06/30/22 1225 07/01/22 0509  07/01/22 0751 07/01/22 1122 07/02/22 0421 07/02/22 0915  WBC 20.2*  --  36.8*  --   --   --  30.6*  --   --  31.0*  --   NEUTROABS 18.6*  --   --   --   --   --   --   --   --   --   --   HGB 7.9*   < > 7.6*   < > 8.5*  --  7.5* 7.5* 9.2* 9.1*  --   HCT 23.8*   < > 21.7*   < > 25.0*  --  22.4* 22.0* 27.0* 26.9*  --   MCV 108.2*  --  103.3*  --   --   --  106.7*  --   --  107.6*  --   PLT 40*  --  32*  --   --  59* 32*  --   --  25*  PENDING   < > = values in this interval not displayed.    Basic Metabolic Panel: Recent Labs  Lab 06/30/22 1015 06/30/22 1158 06/30/22 1653 06/30/22 2155 07/01/22 0509 07/01/22 0751 07/01/22 1122 07/01/22 1535 07/02/22 0421  NA 127*   < >  --  126* 128* 143 131* 129* 130*  K 5.6*   < >  --  5.5* 5.0 4.8 4.6 5.3* 4.7  CL 95*  --   --  92* 96*  --   --  97* 98  CO2 20*  --   --  23 23  --   --  22 21*  GLUCOSE 60*  --   --  130* 127*  --   --  120* 98  BUN 66*  --   --  56* 48*  --   --  41* 35*  CREATININE 5.91*  --   --  4.58* 3.92*  --   --  3.22* 2.82*  CALCIUM 6.8*  --   --  7.0* 7.1*  --   --  7.4* 7.8*  MG 2.1  --  2.1 2.2 2.3  --   --   --  2.8*  PHOS 6.0*  --  7.0*  --  5.8*  5.8*  --   --  5.3* 5.5*   < > = values in this interval not displayed.   GFR: Estimated Creatinine Clearance: 35.3 mL/min (A) (by C-G formula based on SCr of 2.82 mg/dL (H)). Recent Labs  Lab 07/10/2022 1353 06/28/2022 1805 06/21/2022 2010 07/11/2022 2220 06/30/22 0358 06/30/22 1011 07/01/22 0509 07/02/22 0421  WBC 20.2*  --   --   --  36.8*  --  30.6* 31.0*  LATICACIDVEN 5.7*   < > 6.6* 5.7*  --  3.5* 3.0*  --    < > = values in this interval not displayed.    Liver Function Tests: Recent Labs  Lab 06/26/2022 1353 06/30/22 0727 06/30/22 1015 07/01/22 0509 07/01/22 1535 07/02/22 0421  AST 93* 136* 145* 145*  --  165*  ALT 23 36 39 39  --  46*  ALKPHOS 217* 274* 305* 283*  --  343*  BILITOT 7.9* 10.0* 9.9* 12.5*  --  16.8*  PROT 5.1* 5.3* 5.3* 5.8*  --   6.8  ALBUMIN 1.9* 2.1* 2.1* 2.6*  2.6* 2.6* 2.8*  2.7*   No results for input(s): "LIPASE", "AMYLASE" in the last 168 hours. Recent Labs  Lab 07/09/2022 1353 06/30/22 1245 07/02/22 0421  AMMONIA 74* 65* 152*    ABG    Component Value Date/Time   PHART 7.212 (L) 07/01/2022 1122   PCO2ART 61.0 (H) 07/01/2022 1122   PO2ART 90 07/01/2022 1122   HCO3 24.6 07/01/2022 1122   TCO2 26 07/01/2022 1122   ACIDBASEDEF 4.0 (H) 07/01/2022 1122   O2SAT 95 07/01/2022 1122     Coagulation Profile: Recent Labs  Lab 06/27/2022 1353 06/30/22 1225 07/01/22 0509 07/02/22 0421 07/02/22 0915  INR 3.7* 3.0* 3.0* 2.7* 2.8*    Cardiac Enzymes: Recent Labs  Lab 07/04/2022 1353  CKTOTAL 391    HbA1C: No results found for: "HGBA1C"  CBG: Recent Labs  Lab 07/01/22 1619 07/01/22 1912 07/01/22 2317 07/02/22 0316 07/02/22 0728  GLUCAP 112* 130* 111* 100* 93     CRITICAL CARE Performed by: Cristal Generous   Total critical care time: 65 minutes  Critical care time was exclusive of separately billable procedures and treating other patients. Critical care was necessary  to treat or prevent imminent or life-threatening deterioration.  Critical care was time spent personally by me on the following activities: development of treatment plan with patient and/or surrogate as well as nursing, discussions with consultants, evaluation of patient's response to treatment, examination of patient, obtaining history from patient or surrogate, ordering and performing treatments and interventions, ordering and review of laboratory studies, ordering and review of radiographic studies, pulse oximetry and re-evaluation of patient's condition.  Eliseo Gum MSN, AGACNP-BC [place] for pager  07/02/2022, 10:24 AM

## 2022-07-02 NOTE — Progress Notes (Signed)
Dr. Tamala Julian notified via secure chat of critical lab value associated with synovial fluid.

## 2022-07-02 NOTE — Progress Notes (Signed)
PCCM interval progress note   62 yo M etoh cirrhosis, s/p TIPS, ongoing polysub abuse admitted w septic shock w MSOF (MRSA bacteremia, PNA, septic arthritis) acute renal failure, decomp liver, encephalopathy, resp failure.   Remains critically ill on multiple pressors, CRRT MV. BP slightly improved this afternoon, fio2 to 40%. Remains on neo 400 and .03 vaso, NE decr to 11  Labs w worse hypoNa, HyperK, still w slightly low serum bicarb. DIC panel w/o schistos but w ddimer >20000, low fibrinogen, elevated INR and low plt  R knee specimen w 35000 WBC elevated neutrophils lymphocytes  critically ill appearing Moves to painful stimuli  ETT secure. Icteric sclera RRR Mechanically ventilated Round abd Anasarca, RLE > LLE edema Jaundice   Acute encephalopathy  Septic shock -- MRSA bacteremia, PNA, septic arthritis R knee  Acute renal failure with hyperkalemia Metabolic acidosis  Decomp etoh cirrhosis Anemia, coagulopathy, thrombocytopenia  Hyponatremia  -will reach out to neprho re K -AM CMP CBC  -no product resusc for now  -cont vanc, f/u synovial cx -fam mtg tomorrow    Add'l cct 33 min   Eliseo Gum MSN, AGACNP-BC [place] 07/02/2022, 5:38 PM

## 2022-07-02 NOTE — Progress Notes (Signed)
Subjective:  Intubated, sedated   Antibiotics:  Anti-infectives (From admission, onward)    Start     Dose/Rate Route Frequency Ordered Stop   07/01/22 1500  vancomycin (VANCOCIN) IVPB 1000 mg/200 mL premix  Status:  Discontinued        1,000 mg 200 mL/hr over 60 Minutes Intravenous Every 48 hours 07/13/2022 1455 07/19/2022 1635   06/30/22 1200  vancomycin (VANCOREADY) IVPB 1250 mg/250 mL        1,250 mg 166.7 mL/hr over 90 Minutes Intravenous Every 24 hours 06/30/22 1114     06/30/22 1045  linezolid (ZYVOX) IVPB 600 mg  Status:  Discontinued        600 mg 300 mL/hr over 60 Minutes Intravenous Every 12 hours 06/30/22 0949 06/30/22 0957   07/17/2022 1634  vancomycin variable dose per unstable renal function (pharmacist dosing)  Status:  Discontinued         Does not apply See admin instructions 07/08/2022 1635 06/30/22 0949   06/30/2022 1530  metroNIDAZOLE (FLAGYL) IVPB 500 mg        500 mg 100 mL/hr over 60 Minutes Intravenous  Once 07/14/2022 1528 07/11/2022 1925   07/01/2022 1445  vancomycin (VANCOCIN) IVPB 1000 mg/200 mL premix  Status:  Discontinued       See Hyperspace for full Linked Orders Report.   1,000 mg 200 mL/hr over 60 Minutes Intravenous  Once 07/04/2022 1432 06/30/22 1114   06/28/2022 1445  vancomycin (VANCOCIN) IVPB 1000 mg/200 mL premix       See Hyperspace for full Linked Orders Report.   1,000 mg 200 mL/hr over 60 Minutes Intravenous  Once 06/22/2022 1432 06/28/2022 1828   07/05/2022 1445  ceFEPIme (MAXIPIME) 2 g in sodium chloride 0.9 % 100 mL IVPB  Status:  Discontinued        2 g 200 mL/hr over 30 Minutes Intravenous Every 24 hours 07/17/2022 1432 06/30/22 0820       Medications: Scheduled Meds:  amiodarone  150 mg Intravenous Once   Chlorhexidine Gluconate Cloth  6 each Topical Daily   docusate  100 mg Per Tube BID   feeding supplement (PROSource TF20)  60 mL Per Tube TID   feeding supplement (VITAL 1.5 CAL)  1,000 mL Per Tube [id number]   folic acid  1 mg Per Tube  Daily   hydrocortisone sod succinate (SOLU-CORTEF) inj  50 mg Intravenous Q6H   lactulose  30 g Per Tube TID   leptospermum manuka honey  1 Application Topical Daily   multivitamin  1 tablet Per Tube QHS   mouth rinse  15 mL Mouth Rinse Q2H   pantoprazole (PROTONIX) IV  40 mg Intravenous Q12H   polyethylene glycol  17 g Per Tube Daily   thiamine  100 mg Per Tube Daily   Continuous Infusions:   prismasol BGK 4/2.5 400 mL/hr at 07/02/22 0423    prismasol BGK 4/2.5 400 mL/hr at 07/02/22 0426   sodium chloride     sodium chloride     amiodarone 30 mg/hr (07/02/22 1200)   dextrose 20 mL/hr at 07/02/22 1200   fentaNYL infusion INTRAVENOUS 150 mcg/hr (07/02/22 1200)   norepinephrine (LEVOPHED) Adult infusion 12 mcg/min (07/02/22 1200)   phenylephrine (NEO-SYNEPHRINE) Adult infusion 400 mcg/min (07/02/22 1200)   prismasol BGK 4/2.5 1,500 mL/hr at 07/02/22 1204   vancomycin 166.7 mL/hr at 07/02/22 1200   vasopressin 0.03 Units/min (07/02/22 1200)   PRN Meds:.Place/Maintain arterial line **AND** sodium chloride, fentaNYL,  heparin, midazolam, ondansetron (ZOFRAN) IV, mouth rinse, sodium chloride    Objective: Weight change: 1.6 kg  Intake/Output Summary (Last 24 hours) at 07/02/2022 1234 Last data filed at 07/02/2022 1200 Gross per 24 hour  Intake 4188.53 ml  Output 4318.5 ml  Net -129.97 ml    Blood pressure 128/60, pulse 73, temperature 98.4 F (36.9 C), temperature source Bladder, resp. rate 14, height 6' (1.829 m), weight 107.6 kg, SpO2 100 %. Temp:  [97.9 F (36.6 C)-99.1 F (37.3 C)] 98.4 F (36.9 C) (03/14 1200) Pulse Rate:  [63-97] 73 (03/14 1200) Resp:  [10-26] 14 (03/14 1200) BP: (96-139)/(54-65) 128/60 (03/14 1200) SpO2:  [88 %-100 %] 100 % (03/14 1200) Arterial Line BP: (93-135)/(43-62) 115/55 (03/14 1200) FiO2 (%):  [50 %-60 %] 60 % (03/14 1155) Weight:  [107.6 kg] 107.6 kg (03/14 0500)  Physical Exam: Physical Exam Constitutional:      Appearance: He is  ill-appearing.  HENT:     Head: Normocephalic and atraumatic.  Eyes:     General: Scleral icterus present.  Cardiovascular:     Rate and Rhythm: Tachycardia present.     Heart sounds: No murmur heard.    No gallop.  Pulmonary:     Breath sounds: No rhonchi.  Abdominal:     General: There is no distension.  Musculoskeletal:        General: Swelling present.     Right lower leg: Edema present.     Left lower leg: Edema present.  Skin:    Coloration: Skin is jaundiced.  Neurological:     General: No focal deficit present.     Knee effusion  CBC:    BMET Recent Labs    07/01/22 1535 07/02/22 0421  NA 129* 130*  K 5.3* 4.7  CL 97* 98  CO2 22 21*  GLUCOSE 120* 98  BUN 41* 35*  CREATININE 3.22* 2.82*  CALCIUM 7.4* 7.8*      Liver Panel  Recent Labs    07/01/22 0509 07/01/22 1535 07/02/22 0421  PROT 5.8*  --  6.8  ALBUMIN 2.6*  2.6* 2.6* 2.8*  2.7*  AST 145*  --  165*  ALT 39  --  46*  ALKPHOS 283*  --  343*  BILITOT 12.5*  --  16.8*  BILIDIR 6.0*  --  8.3*  IBILI 6.5*  --  8.5*        Sedimentation Rate No results for input(s): "ESRSEDRATE" in the last 72 hours. C-Reactive Protein No results for input(s): "CRP" in the last 72 hours.  Micro Results: Recent Results (from the past 720 hour(s))  Culture, blood (routine x 2)     Status: Abnormal   Collection Time: 06/23/2022  2:22 PM   Specimen: BLOOD  Result Value Ref Range Status   Specimen Description BLOOD SITE NOT SPECIFIED  Final   Special Requests   Final    BOTTLES DRAWN AEROBIC AND ANAEROBIC Blood Culture results may not be optimal due to an inadequate volume of blood received in culture bottles   Culture  Setup Time   Final    GRAM POSITIVE COCCI IN CLUSTERS IN BOTH AEROBIC AND ANAEROBIC BOTTLES CRITICAL RESULT CALLED TO, READ BACK BY AND VERIFIED WITH: PHARMD G ABBOTT 06/30/22 @ 0510 BY AB Performed at [facility], [place] [address] [place], [place] [address]    Culture  METHICILLIN RESISTANT STAPHYLOCOCCUS AUREUS (A)  Final   Report Status 07/02/2022 FINAL  Final   Organism ID, Bacteria METHICILLIN  RESISTANT STAPHYLOCOCCUS AUREUS  Final      Susceptibility   Methicillin resistant staphylococcus aureus - MIC*    CIPROFLOXACIN <=0.5 SENSITIVE Sensitive     ERYTHROMYCIN >=8 RESISTANT Resistant     GENTAMICIN <=0.5 SENSITIVE Sensitive     OXACILLIN >=4 RESISTANT Resistant     TETRACYCLINE <=1 SENSITIVE Sensitive     VANCOMYCIN <=0.5 SENSITIVE Sensitive     TRIMETH/SULFA <=10 SENSITIVE Sensitive     CLINDAMYCIN <=0.25 SENSITIVE Sensitive     RIFAMPIN <=0.5 SENSITIVE Sensitive     Inducible Clindamycin NEGATIVE Sensitive     * METHICILLIN RESISTANT STAPHYLOCOCCUS AUREUS  Blood Culture ID Panel (Reflexed)     Status: Abnormal   Collection Time: 06/23/2022  2:22 PM  Result Value Ref Range Status   Enterococcus faecalis NOT DETECTED NOT DETECTED Final   Enterococcus Faecium NOT DETECTED NOT DETECTED Final   Listeria monocytogenes NOT DETECTED NOT DETECTED Final   Staphylococcus species DETECTED (A) NOT DETECTED Final    Comment: CRITICAL RESULT CALLED TO, READ BACK BY AND VERIFIED WITH: PHARMD G ABBOTT 06/30/22 @ 0510 BY AB    Staphylococcus aureus (BCID) DETECTED (A) NOT DETECTED Final    Comment: Methicillin (oxacillin)-resistant Staphylococcus aureus (MRSA). MRSA is predictably resistant to beta-lactam antibiotics (except ceftaroline). Preferred therapy is vancomycin unless clinically contraindicated. Patient requires contact precautions if  hospitalized. CRITICAL RESULT CALLED TO, READ BACK BY AND VERIFIED WITH: PHARMD G ABBOTT 06/30/22 @ 0510 BY AB    Staphylococcus epidermidis NOT DETECTED NOT DETECTED Final   Staphylococcus lugdunensis NOT DETECTED NOT DETECTED Final   Streptococcus species NOT DETECTED NOT DETECTED Final   Streptococcus agalactiae NOT DETECTED NOT DETECTED Final   Streptococcus pneumoniae NOT DETECTED NOT DETECTED Final    Streptococcus pyogenes NOT DETECTED NOT DETECTED Final   A.calcoaceticus-baumannii NOT DETECTED NOT DETECTED Final   Bacteroides fragilis NOT DETECTED NOT DETECTED Final   Enterobacterales NOT DETECTED NOT DETECTED Final   Enterobacter cloacae complex NOT DETECTED NOT DETECTED Final   Escherichia coli NOT DETECTED NOT DETECTED Final   Klebsiella aerogenes NOT DETECTED NOT DETECTED Final   Klebsiella oxytoca NOT DETECTED NOT DETECTED Final   Klebsiella pneumoniae NOT DETECTED NOT DETECTED Final   Proteus species NOT DETECTED NOT DETECTED Final   Salmonella species NOT DETECTED NOT DETECTED Final   Serratia marcescens NOT DETECTED NOT DETECTED Final   Haemophilus influenzae NOT DETECTED NOT DETECTED Final   Neisseria meningitidis NOT DETECTED NOT DETECTED Final   Pseudomonas aeruginosa NOT DETECTED NOT DETECTED Final   Stenotrophomonas maltophilia NOT DETECTED NOT DETECTED Final   Candida albicans NOT DETECTED NOT DETECTED Final   Candida auris NOT DETECTED NOT DETECTED Final   Candida glabrata NOT DETECTED NOT DETECTED Final   Candida krusei NOT DETECTED NOT DETECTED Final   Candida parapsilosis NOT DETECTED NOT DETECTED Final   Candida tropicalis NOT DETECTED NOT DETECTED Final   Cryptococcus neoformans/gattii NOT DETECTED NOT DETECTED Final   Meth resistant mecA/C and MREJ DETECTED (A) NOT DETECTED Final    Comment: CRITICAL RESULT CALLED TO, READ BACK BY AND VERIFIED WITH: PHARMD G ABBOTT 06/30/22 @ 0510 BY AB Performed at [facility], [address], [place], [place] [address]   Resp panel by RT-PCR (RSV, Flu A&B, Covid) Anterior Nasal Swab     Status: None   Collection Time: 06/24/2022  2:27 PM   Specimen: Anterior Nasal Swab  Result Value Ref Range Status   SARS Coronavirus 2 by RT PCR NEGATIVE  NEGATIVE Final   Influenza A by PCR NEGATIVE NEGATIVE Final   Influenza B by PCR NEGATIVE NEGATIVE Final    Comment: (NOTE) The Xpert Xpress SARS-CoV-2/FLU/RSV plus assay is  intended as an aid in the diagnosis of influenza from Nasopharyngeal swab specimens and should not be used as a sole basis for treatment. Nasal washings and aspirates are unacceptable for Xpert Xpress SARS-CoV-2/FLU/RSV testing.  Fact Sheet for Patients: EntrepreneurPulse.com.au  Fact Sheet for Healthcare Providers: IncredibleEmployment.be  This test is not yet approved or cleared by the [place] FDA and has been authorized for detection and/or diagnosis of SARS-CoV-2 by FDA under an Emergency Use Authorization (EUA). This EUA will remain in effect (meaning this test can be used) for the duration of the COVID-19 declaration under Section 564(b)(1) of the Act, 21 U.S.C. section 360bbb-3(b)(1), unless the authorization is terminated or revoked.     Resp Syncytial Virus by PCR NEGATIVE NEGATIVE Final    Comment: (NOTE) Fact Sheet for Patients: EntrepreneurPulse.com.au  Fact Sheet for Healthcare Providers: IncredibleEmployment.be  This test is not yet approved or cleared by the [place] FDA and has been authorized for detection and/or diagnosis of SARS-CoV-2 by FDA under an Emergency Use Authorization (EUA). This EUA will remain in effect (meaning this test can be used) for the duration of the COVID-19 declaration under Section 564(b)(1) of the Act, 21 U.S.C. section 360bbb-3(b)(1), unless the authorization is terminated or revoked.  Performed at [facility], [place] [address], [place], [place] [address]   Culture, blood (routine x 2)     Status: Abnormal   Collection Time: 06/19/2022  4:57 PM   Specimen: BLOOD RIGHT HAND  Result Value Ref Range Status   Specimen Description BLOOD RIGHT HAND  Final   Special Requests   Final    BOTTLES DRAWN AEROBIC AND ANAEROBIC Blood Culture results may not be optimal due to an inadequate volume of blood received in culture bottles   Culture  Setup Time    Final    GRAM POSITIVE COCCI IN CLUSTERS IN BOTH AEROBIC AND ANAEROBIC BOTTLES CRITICAL VALUE NOTED.  VALUE IS CONSISTENT WITH PREVIOUSLY REPORTED AND CALLED VALUE.    Culture (A)  Final    STAPHYLOCOCCUS AUREUS SUSCEPTIBILITIES PERFORMED ON PREVIOUS CULTURE WITHIN THE LAST 5 DAYS. Performed at [facility], [place] [address] [place], [place] [address]    Report Status 07/02/2022 FINAL  Final  Culture, blood (Routine X 2) w Reflex to ID Panel     Status: None (Preliminary result)   Collection Time: 06/30/22  7:27 AM   Specimen: BLOOD  Result Value Ref Range Status   Specimen Description BLOOD RIGHT ANTECUBITAL  Final   Special Requests   Final    BOTTLES DRAWN AEROBIC AND ANAEROBIC Blood Culture adequate volume   Culture  Setup Time   Final    GRAM POSITIVE COCCI ANAEROBIC BOTTLE ONLY CRITICAL VALUE NOTED.  VALUE IS CONSISTENT WITH PREVIOUSLY REPORTED AND CALLED VALUE. Performed at [facility], [place] [address] [place], [place] [address]    Culture GRAM POSITIVE COCCI  Final   Report Status PENDING  Incomplete  Culture, blood (Routine X 2) w Reflex to ID Panel     Status: Abnormal   Collection Time: 06/30/22  7:29 AM   Specimen: BLOOD RIGHT HAND  Result Value Ref Range Status   Specimen Description BLOOD RIGHT HAND  Final   Special Requests   Final    BOTTLES DRAWN AEROBIC  AND ANAEROBIC Blood Culture adequate volume   Culture  Setup Time   Final    GRAM POSITIVE COCCI IN CLUSTERS ANAEROBIC BOTTLE ONLY CRITICAL VALUE NOTED.  VALUE IS CONSISTENT WITH PREVIOUSLY REPORTED AND CALLED VALUE.    Culture (A)  Final    STAPHYLOCOCCUS AUREUS SUSCEPTIBILITIES PERFORMED ON PREVIOUS CULTURE WITHIN THE LAST 5 DAYS. Performed at [facility], [place] [address] [place], [place] [address]    Report Status 07/02/2022 FINAL  Final  Culture, Respiratory w Gram Stain     Status: None   Collection Time: 06/30/22  1:33 PM   Specimen: Tracheal Aspirate; Respiratory  Result Value  Ref Range Status   Specimen Description TRACHEAL ASPIRATE  Final   Special Requests NONE  Final   Gram Stain   Final    RARE WBC PRESENT, PREDOMINANTLY MONONUCLEAR RARE GRAM POSITIVE COCCI Performed at [facility], [place] [address], [place], [place] [address]    Culture RARE METHICILLIN RESISTANT STAPHYLOCOCCUS AUREUS  Final   Report Status 07/02/2022 FINAL  Final   Organism ID, Bacteria METHICILLIN RESISTANT STAPHYLOCOCCUS AUREUS  Final      Susceptibility   Methicillin resistant staphylococcus aureus - MIC*    CIPROFLOXACIN 2 INTERMEDIATE Intermediate     ERYTHROMYCIN >=8 RESISTANT Resistant     GENTAMICIN <=0.5 SENSITIVE Sensitive     OXACILLIN >=4 RESISTANT Resistant     TETRACYCLINE <=1 SENSITIVE Sensitive     VANCOMYCIN <=0.5 SENSITIVE Sensitive     TRIMETH/SULFA <=10 SENSITIVE Sensitive     CLINDAMYCIN >=8 RESISTANT Resistant     RIFAMPIN <=0.5 SENSITIVE Sensitive     Inducible Clindamycin NEGATIVE Sensitive     * RARE METHICILLIN RESISTANT STAPHYLOCOCCUS AUREUS    Studies/Results: DG Chest Port 1 View  Result Date: 07/02/2022 CLINICAL DATA:  Respiratory failure. EXAM: PORTABLE CHEST 1 VIEW COMPARISON:  July 01, 2022 FINDINGS: EKG leads project over the chest. Endotracheal tube terminates approximately 3.8 cm from the carina, tip between clavicular heads. LEFT-sided central venous access device terminates at the area of the brachiocephalic junction in the upper SVC. RIGHT vas cath terminates in the area the caval to atrial junction. Gastric tube courses through in off the field of the radiograph tip in the LEFT upper quadrant. Cardiomediastinal contours and hilar structures are stable. No pneumothorax. Interstitial and alveolar opacities in the chest are similar to prior imaging. On limited assessment no acute skeletal findings. IMPRESSION: 1. No significant interval change in the appearance of the chest. 2. Support lines and tubes as described. 3. Bilateral interstitial and  alveolar/airspace disease in the chest are similar to prior imaging. Electronically Signed   By: Zetta Bills M.D.   On: 07/02/2022 08:01   DG Chest Port 1 View  Result Date: 07/01/2022 CLINICAL DATA:  Intubation, sepsis, respiratory failure EXAM: PORTABLE CHEST 1 VIEW COMPARISON:  Portable exam 0525 hours compared to 06/30/2022 FINDINGS: Tip of endotracheal tube projects 3.0 cm above carina. Nasogastric tube coiled in proximal stomach. BILATERAL jugular lines with tips projecting over SVC. Normal heart size and mediastinal contours. Patchy BILATERAL airspace infiltrates again seen. No pleural effusion or pneumothorax. IMPRESSION: Persistent BILATERAL pulmonary infiltrates. Electronically Signed   By: Lavonia Dana M.D.   On: 07/01/2022 07:40   DG CHEST PORT 1 VIEW  Result Date: 06/30/2022 CLINICAL DATA:  Line placement for dialysis, intubation EXAM: PORTABLE CHEST 1 VIEW COMPARISON:  Portable exam 1240 hours compared to 0913 hours FINDINGS: Tip of endotracheal tube projects 2.5 cm  above carina. Nasogastric tube coiled in proximal stomach. LEFT jugular line tip projects over SVC. New dual lumen RIGHT jugular central venous catheter with tip projecting over SVC. Enlargement of cardiac silhouette with vascular congestion. BILATERAL pulmonary infiltrates favor pulmonary edema. No pleural effusion or pneumothorax. IMPRESSION: No pneumothorax following RIGHT jugular line placement. Enlargement of cardiac silhouette with pulmonary vascular congestion and diffuse infiltrates most consistent with pulmonary edema. Electronically Signed   By: Lavonia Dana M.D.   On: 06/30/2022 12:54      Assessment/Plan:  INTERVAL HISTORY:   Patient is now sp bedside aspirate of right knee which appears grossly purulent   Principal Problem:   MRSA bacteremia Active Problems:   Acute metabolic encephalopathy   Septic arthritis ([facility] [place])   Septic shock (HCC)   Chronic venous hypertension (idiopathic) with ulcer and  inflammation of right lower extremity (HCC)   Sustained ventricular tachycardia (HCC)   PSVT (paroxysmal supraventricular tachycardia)   Coagulopathy ([place])   Goals of care, counseling/discussion    Kyle Pitts is a 62 y.o. male with history of alcoholic cirrhosis prior group A streptococcal septic joint status post I&D, history of TIPS with recurrent large-volume ascites now admitted with hypothermia and septic shock due to MRSA renal failure requiring CRRT, hepatic failure, coagulapathy  He has a large effusion on the right knee and indeed that aspirate reveals grossly purulent material   If he were stable to undergo surgery with open I and D that would be recommended but he remains critically ill.   Aspirate may help with burden of infection but his global clinical picture appears grim.  I spent 54 minutes with the patient including than 50% of the time in face to face and non face to face time withthe patient personally reviewing along with review of medical records in preparation for the visit and during the visit and in coordination of his care with CCM.   LOS: 3 days   Alcide Evener 07/02/2022, 12:34 PM

## 2022-07-02 NOTE — Progress Notes (Signed)
[facility] Progress Note Patient Name: EBENEZER BRUINSMA DOB: 1960/11/09 MRN: [id number]   Date of Service  07/02/2022  HPI/Events of Note  Platelet count 25 K, down from 32 K, no evidence of active bleeding,  eICU Interventions  Continue to trend platelet count.        Kerry Kass Karolyn Messing 07/02/2022, 5:25 AM

## 2022-07-02 NOTE — Progress Notes (Signed)
Brief cardiology follow-up note.  Appears to be maintaining sinus rhythm on amiodarone.  Not a great long-term option, but would potentially consider switching to oral dosing as the infusion completes.  As it appears that palliative care discussed service, we will continue to follow from a distance.  Glenetta Hew, MD

## 2022-07-03 DIAGNOSIS — I472 Ventricular tachycardia, unspecified: Secondary | ICD-10-CM

## 2022-07-03 DIAGNOSIS — A419 Sepsis, unspecified organism: Secondary | ICD-10-CM | POA: Diagnosis not present

## 2022-07-03 DIAGNOSIS — D689 Coagulation defect, unspecified: Secondary | ICD-10-CM | POA: Diagnosis not present

## 2022-07-03 DIAGNOSIS — I4729 Other ventricular tachycardia: Secondary | ICD-10-CM | POA: Diagnosis not present

## 2022-07-03 DIAGNOSIS — E871 Hypo-osmolality and hyponatremia: Secondary | ICD-10-CM

## 2022-07-03 DIAGNOSIS — Z7189 Other specified counseling: Secondary | ICD-10-CM | POA: Diagnosis not present

## 2022-07-03 DIAGNOSIS — N179 Acute kidney failure, unspecified: Secondary | ICD-10-CM | POA: Diagnosis not present

## 2022-07-03 DIAGNOSIS — Z66 Do not resuscitate: Secondary | ICD-10-CM

## 2022-07-03 DIAGNOSIS — B9562 Methicillin resistant Staphylococcus aureus infection as the cause of diseases classified elsewhere: Secondary | ICD-10-CM | POA: Diagnosis not present

## 2022-07-03 DIAGNOSIS — G9340 Encephalopathy, unspecified: Secondary | ICD-10-CM

## 2022-07-03 DIAGNOSIS — R6521 Severe sepsis with septic shock: Secondary | ICD-10-CM | POA: Diagnosis not present

## 2022-07-03 DIAGNOSIS — I471 Supraventricular tachycardia, unspecified: Secondary | ICD-10-CM

## 2022-07-03 LAB — RENAL FUNCTION PANEL
Albumin: 2.4 g/dL — ABNORMAL LOW (ref 3.5–5.0)
Anion gap: 15 (ref 5–15)
BUN: 33 mg/dL — ABNORMAL HIGH (ref 6–20)
CO2: 18 mmol/L — ABNORMAL LOW (ref 22–32)
Calcium: 8.3 mg/dL — ABNORMAL LOW (ref 8.9–10.3)
Chloride: 99 mmol/L (ref 98–111)
Creatinine, Ser: 2.35 mg/dL — ABNORMAL HIGH (ref 0.61–1.24)
GFR, Estimated: 31 mL/min — ABNORMAL LOW (ref >60)
Glucose, Bld: 117 mg/dL — ABNORMAL HIGH (ref 70–99)
Phosphorus: 4.4 mg/dL (ref 2.5–4.6)
Potassium: 4.6 mmol/L (ref 3.5–5.1)
Sodium: 132 mmol/L — ABNORMAL LOW (ref 135–145)

## 2022-07-03 LAB — HEPATIC FUNCTION PANEL
ALT: 61 U/L — ABNORMAL HIGH (ref 0–44)
AST: 210 U/L — ABNORMAL HIGH (ref 15–41)
Albumin: 2.4 g/dL — ABNORMAL LOW (ref 3.5–5.0)
Alkaline Phosphatase: 352 U/L — ABNORMAL HIGH (ref 38–126)
Bilirubin, Direct: 9.3 mg/dL — ABNORMAL HIGH (ref 0.0–0.2)
Indirect Bilirubin: 8.9 mg/dL — ABNORMAL HIGH (ref 0.3–0.9)
Total Bilirubin: 18.2 mg/dL (ref 0.3–1.2)
Total Protein: 6.4 g/dL — ABNORMAL LOW (ref 6.5–8.1)

## 2022-07-03 LAB — GLUCOSE, CAPILLARY
Glucose-Capillary: 124 mg/dL — ABNORMAL HIGH (ref 70–99)
Glucose-Capillary: 107 mg/dL — ABNORMAL HIGH (ref 70–99)

## 2022-07-03 LAB — CBC
HCT: 28.2 % — ABNORMAL LOW (ref 39.0–52.0)
Hemoglobin: 9.2 g/dL — ABNORMAL LOW (ref 13.0–17.0)
MCH: 35.4 pg — ABNORMAL HIGH (ref 26.0–34.0)
MCHC: 32.6 g/dL (ref 30.0–36.0)
MCV: 108.5 fL — ABNORMAL HIGH (ref 80.0–100.0)
Platelets: 22 10*3/uL — CL (ref 150–400)
RBC: 2.6 MIL/uL — ABNORMAL LOW (ref 4.22–5.81)
RDW: 16.9 % — ABNORMAL HIGH (ref 11.5–15.5)
WBC: 25.5 10*3/uL — ABNORMAL HIGH (ref 4.0–10.5)
nRBC: 2.6 % — ABNORMAL HIGH (ref 0.0–0.2)

## 2022-07-03 LAB — MAGNESIUM: Magnesium: 2.7 mg/dL — ABNORMAL HIGH (ref 1.7–2.4)

## 2022-07-03 MED ORDER — GLYCOPYRROLATE 0.2 MG/ML IJ SOLN
INTRAMUSCULAR | Status: AC
  Administered 2022-07-03: 0.2 mg via INTRAVENOUS
  Filled 2022-07-03: qty 1

## 2022-07-03 MED ORDER — VANCOMYCIN HCL 1500 MG/300ML IV SOLN
1500.0000 mg | INTRAVENOUS | Status: DC
  Filled 2022-07-03: qty 300

## 2022-07-03 MED ORDER — MIDAZOLAM-SODIUM CHLORIDE 100-0.9 MG/100ML-% IV SOLN
0.0000 mg/h | INTRAVENOUS | Status: DC
  Administered 2022-07-03: 2 mg/h via INTRAVENOUS
  Filled 2022-07-03: qty 100

## 2022-07-03 MED ORDER — SODIUM BICARBONATE 8.4 % IV SOLN: 100.0000 meq | Freq: Once | INTRAVENOUS | Status: AC

## 2022-07-03 MED ORDER — PHENYLEPHRINE 80 MCG/ML (10ML) SYRINGE FOR IV PUSH (FOR BLOOD PRESSURE SUPPORT): 80.0000 ug | PREFILLED_SYRINGE | Freq: Once | INTRAVENOUS | Status: DC | PRN

## 2022-07-03 MED ORDER — ACETAMINOPHEN 650 MG RE SUPP: 650.0000 mg | Freq: Four times a day (QID) | RECTAL | Status: DC | PRN

## 2022-07-03 MED ORDER — POLYVINYL ALCOHOL 1.4 % OP SOLN: 1.0000 [drp] | Freq: Four times a day (QID) | OPHTHALMIC | Status: DC | PRN

## 2022-07-03 MED ORDER — MORPHINE 100MG IN NS 100ML (1MG/ML) PREMIX INFUSION
1.0000 mg/h | INTRAVENOUS | Status: DC
  Administered 2022-07-03: 1 mg/h via INTRAVENOUS
  Filled 2022-07-03: qty 100

## 2022-07-03 MED ORDER — ACETAMINOPHEN 325 MG PO TABS: 650.0000 mg | ORAL_TABLET | Freq: Four times a day (QID) | ORAL | Status: DC | PRN

## 2022-07-03 MED ORDER — SODIUM BICARBONATE 8.4 % IV SOLN
INTRAVENOUS | Status: AC
  Administered 2022-07-03: 100 meq via INTRAVENOUS
  Filled 2022-07-03: qty 100

## 2022-07-03 MED ORDER — GLYCOPYRROLATE 0.2 MG/ML IJ SOLN
0.2000 mg | INTRAMUSCULAR | Status: DC | PRN
  Administered 2022-07-03: 0.2 mg via INTRAVENOUS
  Filled 2022-07-03: qty 1

## 2022-07-03 MED ORDER — CALCIUM CHLORIDE 10 % IV SOLN
INTRAVENOUS | Status: AC
  Administered 2022-07-03: 1 g via INTRAVENOUS
  Filled 2022-07-03: qty 20

## 2022-07-03 MED ORDER — CALCIUM CHLORIDE 10 % IV SOLN: 1.0000 g | Freq: Once | INTRAVENOUS | Status: AC

## 2022-07-03 MED ORDER — SODIUM CHLORIDE 0.9 % IV SOLN: INTRAVENOUS | Status: DC

## 2022-07-03 MED ORDER — MIDAZOLAM HCL 2 MG/2ML IJ SOLN: 1.0000 mg | INTRAMUSCULAR | Status: DC | PRN

## 2022-07-03 MED ORDER — GLYCOPYRROLATE 1 MG PO TABS: 1.0000 mg | ORAL_TABLET | ORAL | Status: DC | PRN

## 2022-07-03 MED ORDER — GLYCOPYRROLATE 0.2 MG/ML IJ SOLN: 0.2000 mg | INTRAMUSCULAR | Status: DC | PRN

## 2022-07-03 MED ORDER — AMIODARONE IV BOLUS ONLY 150 MG/100ML
150.0000 mg | Freq: Once | INTRAVENOUS | Status: AC
  Administered 2022-07-03: 150 mg via INTRAVENOUS
  Filled 2022-07-03: qty 100

## 2022-07-03 MED ORDER — MIDAZOLAM BOLUS VIA INFUSION (WITHDRAWAL LIFE SUSTAINING TX): 2.0000 mg | INTRAVENOUS | Status: DC | PRN

## 2022-07-04 LAB — TYPE AND SCREEN
ABO/RH(D): A POS
Antibody Screen: NEGATIVE
Unit division: 0

## 2022-07-04 LAB — CULTURE, BLOOD (ROUTINE X 2): Special Requests: ADEQUATE

## 2022-07-04 LAB — BPAM RBC
Blood Product Expiration Date: 202404082359
Unit Type and Rh: 6200

## 2022-07-05 LAB — BODY FLUID CULTURE W GRAM STAIN

## 2022-07-20 NOTE — Progress Notes (Signed)
Verbal order to attempt to titrate pressors to maintain a MAP >70 by Eliseo Gum NP (CCM).  Montez Hageman RN

## 2022-07-20 NOTE — Progress Notes (Signed)
NAME:  Kyle Pitts, MRN:  [id number], DOB:  June 15, 1960, LOS: 4 ADMISSION DATE:  07/19/2022, CONSULTATION DATE:  3/11 REFERRING MD:  Dr. Francia Greaves, CHIEF COMPLAINT:  AMS   History of Present Illness:  62 y/o M who presented to [facility] ER on 3/11 after being found down at home.    Per report, his sister found him down at home. The patient reportedly cares for his sister. Unknown total down time.  EMS called and found his glucose to be 20-30's. Initial ER evaluation notable for multiple metabolic derangements (Na 122, K 5.6, CO2 17, BUn 61/Cr 5.42, iCA 0.84, lactic acid 5.9, WBC 20, Hgb 7.9, platelets 40, and troponin 3,200), hypothermia but stable hemodynamics. COVID/Flu A&B / RSV negative. UDS positive for cocaine & opiates.  He was altered but somewhat interactive on arrival.  He was sent for CT head which was negative for acute process. Additionally, he had a CT ABD that showed scattered airspace opacities in the lung bases R>L, interval TIPS with resolution of the abdominal ascites. CXR showed a small ill-defined opacity in the medial right lung base, suspected to be atelectasis. He was treated with IVF, empiric antibiotics, and pan cultured.  After returning from CT he was hypotensive with SBP in the 60's.  He was given an additional 500 ml NS bolus, additional 50 mEq of sodium bicarbonate and calcium gluconate.   PCCM called for ICU admission.   Pertinent  Medical History  Anemia  ETOH Cirrhosis s/p TIPS Peptic Ulcers - gastric & duodenal  Esophageal Varices  Hyponatremia  Allergic Reaction  Septic Arthritis  Cataracts Group A Strep Infection 2022  Significant Hospital Events: Including procedures, antibiotic start and stop dates in addition to other pertinent events   3/11 Admit after being found down, admitted with septic shock, RLL PNA, RLE wound, AKI, AMS, multiple metabolic derangements and coagulopathy  3/12 intubated due poor mental status, worsening shock, multiple pressors, CRRT.  Cardioverted for VT  3/13 Cards consulted for VT. 3/14 remains on neo ne vaso amio. R knee dx arthrocentesis c/w septic arthritis  08/02/2022 overall some interval worsening. goc mtg pending.   Interim History / Subjective:  Remains on pressors, req about the same- marginal decr after adding solucortef yesterday  HyperK yesterday afternoon --> crrt post changed to 4k  Synovial fluid w GPCs   Again worse thrombocytopenia Worse acidosis & hyperbili    Unable to pull on CRRT  2/2 hemodynamics. More hypoxic   Having some pacs pvcs and some transient irreg rhythms  Objective   Blood pressure 120/66, pulse 79, temperature 98.6 F (37 C), resp. rate (!) 21, height 6' (1.829 m), weight 107.7 kg, SpO2 95 %. CVP:  [8 mmHg-18 mmHg] 10 mmHg  Vent Mode: PRVC FiO2 (%):  [40 %-60 %] 40 % Set Rate:  [24 bmp] 24 bmp Vt Set:  [620 mL] 620 mL PEEP:  [8 cmH20] 8 cmH20 Plateau Pressure:  [23 cmH20-29 cmH20] 25 cmH20   Intake/Output Summary (Last 24 hours) at 08/02/22 0908 Last data filed at 08/02/2022 0900 Gross per 24 hour  Intake 4134.59 ml  Output 4702.4 ml  Net -567.81 ml   Filed Weights   07/01/22 0500 07/02/22 0500 2022/08/02 0228  Weight: 109.2 kg 107.6 kg 107.7 kg   Examination: General:  Critically and chronically ill M intubated and on CRRT  Neuro: No response to pain. 2.40mm pupils w minimal response.  HEENNT: Icteric sclera. ETT secure. Brown tinged oral secretions.  CV: irregular at  times.,  PULM: Mechanically ventilated, symmetrical chest expansion   GI: round, hypoactive.  GU: foley  Extremities: R knee edematous and + palpable effusion. Anasarca  Skin: Jaundice, scattered telangiectasias. Small areas of macerated skin.erythematous fingers. RLE chronic wound, dressing intact.    Resolved Hospital Problem list     Assessment & Plan:   Scott  -grim prognosis. -- fam mtg 07-07-2022 about 1000. Will rec comfort care   Acute encephalopathy  -HE, sepsis w MSOF  -worse mental status  July 07, 2022 despite less sedation speaking to totality of worsening MSOF  P -rass goal 0  -tx for contributing dx as below   Acute hypoxic respiratory failure  -MRSA PNA, pulm edema P -cont MV support -- anticipate likely worse pulm edema to come as we are unable to pull on crrt  -VAP, pulm hygiene -PRN CXR, gas  Septic shock w MSOF due to MRSA bacteremia, MRSA PNA, Septic arthritis R Knee (s/p dx arthrocentesis 3/14 --GPCs, presume MRSA and likely source of bacteremia)  Hx prior GAS septic arthritis R knee s/p prior I&D and chronic RLE wound, w/o overt s/sx cellulitis  -MSOF -- renal failure, decomp liver, encephalopathy, resp failure w hypoxia, dysrhythmias   P -Vanc - ID following  -under more favorable circumstances, eventual TEE but is exceeding poor candidate for this in context of critical illness  -Definitive tx of septic arthritis would req open op case, which he is nowhere near stable enough to consider  -on neo NE vaso solu cortef -- map goal > 65, though ideally would target >70. Preferentially would decr neo if in position to wean, and leave vaso at 0.03.     VT / SVT Demand ischemia PVCs PACs  -in setting of septic shock + MSOF  -was CV x2 3/13 and started on amio P -cards following -on amio which would not be a good long term rx given liver dz   etoh cirrhosis, decompensated Portal HTN, g1 esophageal varices s/p TIPS  Transaminitis  Thrombocytopenia Anemia Coagulopathy Hyperammonemia Hyperbilirubinemia Hypoglycemia  -admission MELD-Na 40  -during admit continues to have ddimer > 20k, low fibrinogen, elevated inr low plt, no schistos. Constellation is probably both reflective of liver failure and component of dic spectrum 2022/07/07 seeing incr LFTs and TBili, worse thrombocytopenia.   P -following lfts, coags, PRN ammonia  -cont lactulose -maintain active t&s -- no indication for product at present  -remains on D10   Acute renal failure NAGMA,  worse Hyponatremia Hypokalemia Hypocalcemia Hyperphosphatemia  Hypermagnesemia  P -CRRT per nephro -unfortunately unable to pull fluid, unable to tolerate hemodynamically    Polysubstance abuse  Etoh abuse  -cessation support if survives  Best Practice (right click and "Reselect all SmartList Selections" daily)  Diet/type: NPO; EN  DVT prophylaxis: SCD GI prophylaxis: PPI Lines: Central line, Dialysis Catheter, Arterial Line, and yes and it is still needed Foley:  Yes, and it is still needed Code Status:  full code Last date of multidisciplinary goals of care discussion:  3/14  Labs   CBC: Recent Labs  Lab 07/11/2022 1353 06/21/2022 1409 06/30/22 0358 06/30/22 0946 06/30/22 1225 07/01/22 0509 07/01/22 0751 07/01/22 1122 07/02/22 0421 07/02/22 0915 2022-07-07 0439  WBC 20.2*  --  36.8*  --   --  30.6*  --   --  31.0*  --  25.5*  NEUTROABS 18.6*  --   --   --   --   --   --   --   --   --   --  HGB 7.9*   < > 7.6*   < >  --  7.5* 7.5* 9.2* 9.1*  --  9.2*  HCT 23.8*   < > 21.7*   < >  --  22.4* 22.0* 27.0* 26.9*  --  28.2*  MCV 108.2*  --  103.3*  --   --  106.7*  --   --  107.6*  --  108.5*  PLT 40*  --  32*  --  59* 32*  --   --  25* 25* 22*   < > = values in this interval not displayed.    Basic Metabolic Panel: Recent Labs  Lab 06/30/22 1653 06/30/22 2155 07/01/22 0509 07/01/22 0751 07/01/22 1122 07/01/22 1535 07/02/22 0421 07/02/22 1334 2022-07-27 0439  NA  --  126* 128*   < > 131* 129* 130* 128* 132*  K  --  5.5* 5.0   < > 4.6 5.3* 4.7 5.4* 4.6  CL  --  92* 96*  --   --  97* 98 99 99  CO2  --  23 23  --   --  22 21* 21* 18*  GLUCOSE  --  130* 127*  --   --  120* 98 119* 117*  BUN  --  56* 48*  --   --  41* 35* 34* 33*  CREATININE  --  4.58* 3.92*  --   --  3.22* 2.82* 2.48* 2.35*  CALCIUM  --  7.0* 7.1*  --   --  7.4* 7.8* 7.8* 8.3*  MG 2.1 2.2 2.3  --   --   --  2.8*  --  2.7*  PHOS 7.0*  --  5.8*  5.8*  --   --  5.3* 5.5* 4.0 4.4   < > = values in  this interval not displayed.   GFR: Estimated Creatinine Clearance: 42.4 mL/min (A) (by C-G formula based on SCr of 2.35 mg/dL (H)). Recent Labs  Lab 06/19/2022 2010 07/13/2022 2220 06/30/22 0358 06/30/22 1011 07/01/22 0509 07/02/22 0421 07-27-2022 0439  WBC  --   --  36.8*  --  30.6* 31.0* 25.5*  LATICACIDVEN 6.6* 5.7*  --  3.5* 3.0*  --   --     Liver Function Tests: Recent Labs  Lab 06/30/22 0727 06/30/22 1015 07/01/22 0509 07/01/22 1535 07/02/22 0421 07/02/22 1334 Jul 27, 2022 0439  AST 136* 145* 145*  --  165*  --  210*  ALT 36 39 39  --  46*  --  61*  ALKPHOS 274* 305* 283*  --  343*  --  352*  BILITOT 10.0* 9.9* 12.5*  --  16.8*  --  18.2*  PROT 5.3* 5.3* 5.8*  --  6.8  --  6.4*  ALBUMIN 2.1* 2.1* 2.6*  2.6* 2.6* 2.8*  2.7* 2.3* 2.4*  2.4*   No results for input(s): "LIPASE", "AMYLASE" in the last 168 hours. Recent Labs  Lab 07/01/2022 1353 06/30/22 1245 07/02/22 0421  AMMONIA 74* 65* 152*    ABG    Component Value Date/Time   PHART 7.212 (L) 07/01/2022 1122   PCO2ART 61.0 (H) 07/01/2022 1122   PO2ART 90 07/01/2022 1122   HCO3 24.6 07/01/2022 1122   TCO2 26 07/01/2022 1122   ACIDBASEDEF 4.0 (H) 07/01/2022 1122   O2SAT 95 07/01/2022 1122     Coagulation Profile: Recent Labs  Lab 07/14/2022 1353 06/30/22 1225 07/01/22 0509 07/02/22 0421 07/02/22 0915  INR 3.7* 3.0* 3.0* 2.7* 2.8*    Cardiac Enzymes:  Recent Labs  Lab 07/02/2022 1353  CKTOTAL 391    HbA1C: No results found for: "HGBA1C"  CBG: Recent Labs  Lab 07/02/22 1529 07/02/22 1916 07/02/22 2309 07-28-22 0327 07/28/2022 0745  GLUCAP 104* 112* 126* 124* 107*    CRITICAL CARE Performed by: Cristal Generous   Total critical care time: 42 min minutes  Critical care time was exclusive of separately billable procedures and treating other patients. Critical care was necessary to treat or prevent imminent or life-threatening deterioration.  Critical care was time spent personally by me on  the following activities: development of treatment plan with patient and/or surrogate as well as nursing, discussions with consultants, evaluation of patient's response to treatment, examination of patient, obtaining history from patient or surrogate, ordering and performing treatments and interventions, ordering and review of laboratory studies, ordering and review of radiographic studies, pulse oximetry and re-evaluation of patient's condition.  Eliseo Gum MSN, AGACNP-BC [place] for pager  28-Jul-2022, 9:08 AM

## 2022-07-20 NOTE — Progress Notes (Signed)
Patient ID: Kyle Pitts, male   DOB: 19-Jun-1960, 62 y.o.   MRN: [id number] S:Dialysate changed to 2K/2.5Ca bath due to persistent hyperkalemia.  Unable to tolerate UF due to worsening hypotension.  Pt was seen and examined while on CRRT and labs and orders reviewed and changes made accordingly.  O:BP 120/66   Pulse 79   Temp 98.6 F (37 C)   Resp (!) 21   Ht 6' (1.829 m)   Wt 107.7 kg   SpO2 95%   BMI 32.20 kg/m   Intake/Output Summary (Last 24 hours) at 07-22-22 0932 Last data filed at 2022-07-22 0900 Gross per 24 hour  Intake 4134.59 ml  Output 4702.4 ml  Net -567.81 ml   Intake/Output: I/O last 3 completed shifts: In: 5966.5 [I.V.:4233.6; Other:258; NG/GT:1225; IV Piggyback:249.9] Out: 6654.4 [Urine:43; Stool:1]  Intake/Output this shift:  Total I/O In: 284.1 [I.V.:244.1; NG/GT:40] Out: 315  Weight change: 0.1 kg Gen: intubated and sedated CVS: RRR Resp: ventilated BS bilaterally  Abd:+BS, soft, NT/ND Ext: 2+ anasarca  Recent Labs  Lab 06/21/2022 1353 07/16/2022 1409 06/30/22 0727 06/30/22 0946 06/30/22 1015 06/30/22 1158 06/30/22 1653 06/30/22 2155 07/01/22 0509 07/01/22 0751 07/01/22 1122 07/01/22 1535 07/02/22 0421 07/02/22 1334 July 22, 2022 0439  NA 122*   < > 127*   < > 127*   < >  --  126* 128* 143 131* 129* 130* 128* 132*  K 5.6*   < > 6.2*   < > 5.6*   < >  --  5.5* 5.0 4.8 4.6 5.3* 4.7 5.4* 4.6  CL 89*   < > 96*  --  95*  --   --  92* 96*  --   --  97* 98 99 99  CO2 17*   < > 15*  --  20*  --   --  23 23  --   --  22 21* 21* 18*  GLUCOSE 185*   < > 63*  --  60*  --   --  130* 127*  --   --  120* 98 119* 117*  BUN 61*   < > 66*  --  66*  --   --  56* 48*  --   --  41* 35* 34* 33*  CREATININE 5.42*   < > 5.88*  --  5.91*  --   --  4.58* 3.92*  --   --  3.22* 2.82* 2.48* 2.35*  ALBUMIN 1.9*  --  2.1*  --  2.1*  --   --   --  2.6*  2.6*  --   --  2.6* 2.8*  2.7* 2.3* 2.4*  2.4*  CALCIUM 7.1*   < > 6.9*  --  6.8*  --   --  7.0* 7.1*  --   --  7.4* 7.8*  7.8* 8.3*  PHOS  --    < >  --   --  6.0*  --  7.0*  --  5.8*  5.8*  --   --  5.3* 5.5* 4.0 4.4  AST 93*  --  136*  --  145*  --   --   --  145*  --   --   --  165*  --  210*  ALT 23  --  36  --  39  --   --   --  39  --   --   --  46*  --  61*   < > = values in this  interval not displayed.   Liver Function Tests: Recent Labs  Lab 07/01/22 0509 07/01/22 1535 07/02/22 0421 07/02/22 1334 07-21-22 0439  AST 145*  --  165*  --  210*  ALT 39  --  46*  --  61*  ALKPHOS 283*  --  343*  --  352*  BILITOT 12.5*  --  16.8*  --  18.2*  PROT 5.8*  --  6.8  --  6.4*  ALBUMIN 2.6*  2.6*   < > 2.8*  2.7* 2.3* 2.4*  2.4*   < > = values in this interval not displayed.   No results for input(s): "LIPASE", "AMYLASE" in the last 168 hours. Recent Labs  Lab 07/11/2022 1353 06/30/22 1245 07/02/22 0421  AMMONIA 74* 65* 152*   CBC: Recent Labs  Lab 06/22/2022 1353 06/24/2022 1409 06/30/22 0358 06/30/22 0946 07/01/22 0509 07/01/22 0751 07/01/22 1122 07/02/22 0421 07/02/22 0915 07/21/2022 0439  WBC 20.2*  --  36.8*  --  30.6*  --   --  31.0*  --  25.5*  NEUTROABS 18.6*  --   --   --   --   --   --   --   --   --   HGB 7.9*   < > 7.6*   < > 7.5*   < > 9.2* 9.1*  --  9.2*  HCT 23.8*   < > 21.7*   < > 22.4*   < > 27.0* 26.9*  --  28.2*  MCV 108.2*  --  103.3*  --  106.7*  --   --  107.6*  --  108.5*  PLT 40*  --  32*   < > 32*  --   --  25* 25* 22*   < > = values in this interval not displayed.   Cardiac Enzymes: Recent Labs  Lab 07/11/2022 1353  CKTOTAL 391   CBG: Recent Labs  Lab 07/02/22 1529 07/02/22 1916 07/02/22 2309 Jul 21, 2022 0327 Jul 21, 2022 0745  GLUCAP 104* 112* 126* 124* 107*    Iron Studies: No results for input(s): "IRON", "TIBC", "TRANSFERRIN", "FERRITIN" in the last 72 hours. Studies/Results: DG Chest Port 1 View  Result Date: 07/02/2022 CLINICAL DATA:  Respiratory failure. EXAM: PORTABLE CHEST 1 VIEW COMPARISON:  July 01, 2022 FINDINGS: EKG leads project over the  chest. Endotracheal tube terminates approximately 3.8 cm from the carina, tip between clavicular heads. LEFT-sided central venous access device terminates at the area of the brachiocephalic junction in the upper SVC. RIGHT vas cath terminates in the area the caval to atrial junction. Gastric tube courses through in off the field of the radiograph tip in the LEFT upper quadrant. Cardiomediastinal contours and hilar structures are stable. No pneumothorax. Interstitial and alveolar opacities in the chest are similar to prior imaging. On limited assessment no acute skeletal findings. IMPRESSION: 1. No significant interval change in the appearance of the chest. 2. Support lines and tubes as described. 3. Bilateral interstitial and alveolar/airspace disease in the chest are similar to prior imaging. Electronically Signed   By: Zetta Bills M.D.   On: 07/02/2022 08:01    amiodarone  150 mg Intravenous Once   Chlorhexidine Gluconate Cloth  6 each Topical Daily   docusate  100 mg Per Tube BID   feeding supplement (PROSource TF20)  60 mL Per Tube TID   feeding supplement (VITAL 1.5 CAL)  1,000 mL Per Tube [id number]   folic acid  1 mg Per Tube Daily   hydrocortisone sod  succinate (SOLU-CORTEF) inj  50 mg Intravenous Q6H   lactulose  30 g Per Tube TID   leptospermum manuka honey  1 Application Topical Daily   multivitamin  1 tablet Per Tube QHS   mouth rinse  15 mL Mouth Rinse Q2H   pantoprazole (PROTONIX) IV  40 mg Intravenous Q12H   polyethylene glycol  17 g Per Tube Daily   thiamine  100 mg Per Tube Daily    BMET    Component Value Date/Time   NA 132 (L) 07/10/2022 0439   K 4.6 2022/07/10 0439   CL 99 10-Jul-2022 0439   CO2 18 (L) 07-10-22 0439   GLUCOSE 117 (H) 07-10-2022 0439   BUN 33 (H) Jul 10, 2022 0439   CREATININE 2.35 (H) 07-10-22 0439   CREATININE 0.52 (L) 11/21/2020 1535   CALCIUM 8.3 (L) 07/10/22 0439   GFRNONAA 31 (L) 07-10-22 0439   GFRAA  05/13/2010 1718    >60        The eGFR  has been calculated using the MDRD equation. This calculation has not been validated in all clinical situations. eGFR's persistently <60 mL/min signify possible Chronic Kidney Disease.   CBC    Component Value Date/Time   WBC 25.5 (H) 2022-07-10 0439   RBC 2.60 (L) 07-10-22 0439   HGB 9.2 (L) 2022/07/10 0439   HCT 28.2 (L) 10-Jul-2022 0439   PLT 22 (LL) 2022/07/10 0439   MCV 108.5 (H) 07-10-22 0439   MCH 35.4 (H) 07/10/22 0439   MCHC 32.6 10-Jul-2022 0439   RDW 16.9 (H) 2022-07-10 0439   LYMPHSABS 0.8 07/07/2022 1353   MONOABS 0.2 07/01/2022 1353   EOSABS 0.0 07/19/2022 1353   BASOSABS 0.0 07/07/2022 1353     Assessment/Plan:  ARF, oliguric -  presumably ischemic ATN in setting of septic shock due to MRSA bacteremia.  Started on CRRT 06/30/22 but unable to UF due to progressive worsening of his hypotension requiring escalation of pressors.  Dialysate 2K/2.5Ca @ 1500 mL/hr, pre and post-filter 4K/2.5Ca at 400 mL/hr.  Unable to UF so will keep even.  HTN/Volume - markedly volume overloaded.  Unable to UF due to ongoing hypotension. Anemia of critical illness - transfuse prn Septic shock due to MRSA bacteremia and septic right knee- per PCCM.  Too unstable for TEE.  Poor overall prognosis and plan for family meeting today to discuss goals of care.  Thrombocytopenia - not on heparin, likely DIC Cirrhosis - had been stable prior to admission. Polysubstance abuse - UDS notable for cocaine + Disposition - poor overall prognosis and recommend transition to comfort care given multiple co-morbidities and worsening clinical condition.  If he is unable to tolerate CRRT for volume removal, we have nothing further to offer.   Donetta Potts, MD [facility]

## 2022-07-20 NOTE — IPAL (Addendum)
  Interdisciplinary Goals of Care Family Meeting   Date carried out: 2022-07-10  Location of the meeting: Bedside  Member's involved: Nurse Practitioner, Bedside Registered Nurse, and Family Member or next of kin  Durable Power of Attorney or acting medical decision maker: Sons Kyle Pitts and Kyle Pitts     Discussion: We discussed goals of care for Kyle Pitts .  Son Kyle Pitts and his w Kyle Pitts, son Kyle Pitts, and pt uncle (functionally brother) Kyle Pitts at bedside. They  presented during some of the previously documented interventions re acute cv decline and are understandably tearful.  Very briefly discussed global decline, with quick shift to present cardiac dysfxn w irreg rhythm, fq pvs and fist discussed Code Status.  I recommend DNR as I do not believe compressions/ DF will offer benefit in context of his overall prognosis. Family agrees with this recommendation.  Allowed space to discuss his critical illness, interval decline. I conveyed that unfortunately despite all ongoing measures this is not survivable, and recommend a compassionate transition to comfort care.  Family agrees with this plan.   Orders placed for comfort care Expect in hospital death, minutes to hours.  Emotional support provided    D/w RN -- would prioritize comfortable extubation (morphine versed glyco) as first intervention. Then dc crrt, dc pressors. With coagulopathy/thrombocytopenia, fine to leave central access and art line in place initially to maximize fam time w pt and dc prn.  Code status:   Code Status: DNR   Disposition: In-patient comfort care  Time spent for the meeting: 18 min    Eliseo Gum MSN, AGACNP-BC [place] for pager Jul 10, 2022, 10:31 AM

## 2022-07-20 NOTE — Progress Notes (Addendum)
Patient TOD 1228 called by Montez Hageman RN and Normajean Baxter RN.  Montez Hageman RN

## 2022-07-20 NOTE — Procedures (Signed)
Extubation Procedure Note  Patient Details:   Name: Kyle Pitts DOB: 1960/11/11 MRN: [id number]   Airway Documentation:    Vent end date: Jul 14, 2022 Vent end time: 1040   Evaluation  Patient extubated per comfort care measures. RN at bedside.  [name] July 14, 2022, 10:52 AM

## 2022-07-20 NOTE — Progress Notes (Signed)
   Patient remains in [place]. No further recs from cardiology at this time. Noted plans for family meeting today. Cardiology will sign off.   SignedReino Bellis, NP-C Jul 28, 2022, 9:16 AM Pager: [phone]

## 2022-07-20 NOTE — Progress Notes (Signed)
25 cc fentanyl gtt wasted with Wynona Meals RN.   Montez Hageman RN

## 2022-07-20 NOTE — Death Summary Note (Signed)
DEATH SUMMARY   Patient Details  Name: Kyle Pitts MRN: [id number] DOB: Dec 06, 1960  Admission/Discharge Information   Admit Date:  07-27-2022  Date of Death:  07/31/22   Time of Death:  12:28   Length of Stay: 4  Referring Physician: Pcp, No   Reason(s) for Hospitalization  Presenting with altered mental status  Etiology septic shock with multisystem organ failure  Diagnoses  Preliminary cause of death: Septic shock due to MRSA  Secondary Diagnoses (including complications and co-morbidities):   Acute encephalopathy MRSA bacteremia MRSA PNA Septic arthritis R knee  Acute hypoxic respiratory failure  Pulmonary edema  Acute renal failure Hyperkalemia Hyponatremia  Hypocalcemia Hypermagnesemia Hyperphosphatemia  Lactic acidosis Anion gap metabolic acidosis  Decompensated cirrhosis Alcohol abuse  Hypoglycemia Coagulopathy  Thrombocytopenia Anemia Hypofibrinogenemia  DIC  Hyperammonemia Hyperbilirubinemia  Hx esophageal varices s/p TIPS  Ventricular tachycardia SVT  Demand ischemia  Chronic venous stasis wound right lower extremity  Polysubstance abuse disorder  Encounter for palliative care  DNR   Brief Hospital Course (including significant findings, care, treatment, and services provided and events leading to death)  Kyle Pitts is a 62 y.o. year old male who was admitted 07/27/2022 with AMS after being found down at home. He presented in shock, hypothermic, and favored to be septic and tx with vanc/cefepime. At time of presentation he had evidence of multisystem organ dysfunction.   Over his course he was found to have septic shock due to MRSA bacteremia, PNA and septic arthritis of R knee. He required support of multiple pressors. He had worsening multisystem organ failure, requiring intubation for worsening encephalopathy and PNA, CRRT for acute renal failure, CV for VT, decompensated liver failure with hematologic& metabolic sequelae of these  processes.   PCCM, nephrology, orthopaedics, infectious disease, cardiology and nephrology were collaborative in treatment efforts.   Despite best efforts, the patient continued to decline and maintained a grim prognosis for survival.   On July 31, 2022 with family at the bedside the patient was compassionately transitioned to comfort care, and died at 12:28.     Pertinent Labs and Studies  Significant Diagnostic Studies DG Chest Port 1 View  Result Date: 07/02/2022 CLINICAL DATA:  Respiratory failure. EXAM: PORTABLE CHEST 1 VIEW COMPARISON:  July 01, 2022 FINDINGS: EKG leads project over the chest. Endotracheal tube terminates approximately 3.8 cm from the carina, tip between clavicular heads. LEFT-sided central venous access device terminates at the area of the brachiocephalic junction in the upper SVC. RIGHT vas cath terminates in the area the caval to atrial junction. Gastric tube courses through in off the field of the radiograph tip in the LEFT upper quadrant. Cardiomediastinal contours and hilar structures are stable. No pneumothorax. Interstitial and alveolar opacities in the chest are similar to prior imaging. On limited assessment no acute skeletal findings. IMPRESSION: 1. No significant interval change in the appearance of the chest. 2. Support lines and tubes as described. 3. Bilateral interstitial and alveolar/airspace disease in the chest are similar to prior imaging. Electronically Signed   By: Zetta Bills M.D.   On: 07/02/2022 08:01   DG Chest Port 1 View  Result Date: 07/01/2022 CLINICAL DATA:  Intubation, sepsis, respiratory failure EXAM: PORTABLE CHEST 1 VIEW COMPARISON:  Portable exam 0525 hours compared to 06/30/2022 FINDINGS: Tip of endotracheal tube projects 3.0 cm above carina. Nasogastric tube coiled in proximal stomach. BILATERAL jugular lines with tips projecting over SVC. Normal heart size and mediastinal contours. Patchy BILATERAL airspace infiltrates again seen. No  pleural  effusion or pneumothorax. IMPRESSION: Persistent BILATERAL pulmonary infiltrates. Electronically Signed   By: Lavonia Dana M.D.   On: 07/01/2022 07:40   DG CHEST PORT 1 VIEW  Result Date: 06/30/2022 CLINICAL DATA:  Line placement for dialysis, intubation EXAM: PORTABLE CHEST 1 VIEW COMPARISON:  Portable exam 1240 hours compared to 0913 hours FINDINGS: Tip of endotracheal tube projects 2.5 cm above carina. Nasogastric tube coiled in proximal stomach. LEFT jugular line tip projects over SVC. New dual lumen RIGHT jugular central venous catheter with tip projecting over SVC. Enlargement of cardiac silhouette with vascular congestion. BILATERAL pulmonary infiltrates favor pulmonary edema. No pleural effusion or pneumothorax. IMPRESSION: No pneumothorax following RIGHT jugular line placement. Enlargement of cardiac silhouette with pulmonary vascular congestion and diffuse infiltrates most consistent with pulmonary edema. Electronically Signed   By: Lavonia Dana M.D.   On: 06/30/2022 12:54   ECHOCARDIOGRAM COMPLETE  Result Date: 06/30/2022    ECHOCARDIOGRAM REPORT   Patient Name:   Kyle Pitts Date of Exam: 06/30/2022 Medical Rec #:  [id number]       Height:       72.0 in Accession #:    [id number]      Weight:       233.7 lb Date of Birth:  08-Apr-1961       BSA:          2.276 m Patient Age:    21 years        BP:           115/50 mmHg Patient Gender: M               HR:           86 bpm. Exam Location:  Inpatient Procedure: 2D Echo, Cardiac Doppler and Color Doppler STAT ECHO Indications:    Bacteremia. Shock  History:        Patient has prior history of Echocardiogram examinations, most                 recent 08/27/2021. Abnormal ECG, Arrythmias:Atrial Fibrillation;                 Signs/Symptoms:Bacteremia.  Sonographer:    Roseanna Rainbow RDCS Referring Phys: [id number] PAULA B SIMPSON  Sonographer Comments: Technically difficult study due to poor echo windows and echo performed with patient supine and on  artificial respirator. STAT echo for shock. Study rushed for central line placement. IMPRESSIONS  1. Left ventricular ejection fraction, by estimation, is 70 to 75%. The left ventricle has hyperdynamic function. The left ventricle has no regional wall motion abnormalities. Left ventricular diastolic parameters are indeterminate.  2. Right ventricular systolic function is hyperdynamic. The right ventricular size is mildly enlarged. There is moderately elevated pulmonary artery systolic pressure.  3. The mitral valve is normal in structure. No evidence of mitral valve regurgitation. No evidence of mitral stenosis.  4. The aortic valve is tricuspid. There is mild calcification of the aortic valve. Aortic valve regurgitation is trivial.  5. The inferior vena cava is dilated in size with <50% respiratory variability, suggesting right atrial pressure of 15 mmHg.  6. Elvated aortic flows reflect hyperdynamic state rather that aortic stenosis. Conclusion(s)/Recommendation(s): No evidence of valvular vegetations on this transthoracic echocardiogram. Consider a transesophageal echocardiogram to exclude infective endocarditis if clinically indicated. FINDINGS  Left Ventricle: Left ventricular ejection fraction, by estimation, is 70 to 75%. The left ventricle has hyperdynamic function. The left ventricle has no regional wall motion abnormalities. The left ventricular internal  cavity size was normal in size. There is no left ventricular hypertrophy. Left ventricular diastolic parameters are indeterminate. Right Ventricle: The right ventricular size is mildly enlarged. No increase in right ventricular wall thickness. Right ventricular systolic function is hyperdynamic. There is moderately elevated pulmonary artery systolic pressure. The tricuspid regurgitant velocity is 3.24 m/s, and with an assumed right atrial pressure of 15 mmHg, the estimated right ventricular systolic pressure is 0000000 mmHg. Left Atrium: Left atrial size was  normal in size. Right Atrium: Right atrial size was normal in size. Pericardium: There is no evidence of pericardial effusion. Mitral Valve: The mitral valve is normal in structure. No evidence of mitral valve regurgitation. No evidence of mitral valve stenosis. Tricuspid Valve: The tricuspid valve is normal in structure. Tricuspid valve regurgitation is trivial. No evidence of tricuspid stenosis. Aortic Valve: The aortic valve is tricuspid. There is mild calcification of the aortic valve. There is mild aortic valve annular calcification. Aortic valve regurgitation is trivial. Aortic valve mean gradient measures 17.0 mmHg. Aortic valve peak gradient measures 31.8 mmHg. Aortic valve area, by VTI measures 4.06 cm. Pulmonic Valve: The pulmonic valve was normal in structure. Pulmonic valve regurgitation is not visualized. No evidence of pulmonic stenosis. Aorta: The aortic root is normal in size and structure. Venous: The inferior vena cava is dilated in size with less than 50% respiratory variability, suggesting right atrial pressure of 15 mmHg. IAS/Shunts: The interatrial septum was not well visualized.  LEFT VENTRICLE PLAX 2D LVIDd:         5.60 cm LVIDs:         3.60 cm LV PW:         0.90 cm LV IVS:        0.90 cm LVOT diam:     2.60 cm LV SV:         153 LV SV Index:   67 LVOT Area:     5.31 cm  RIGHT VENTRICLE             IVC RV S prime:     17.20 cm/s  IVC diam: 2.60 cm TAPSE (M-mode): 2.4 cm LEFT ATRIUM           Index        RIGHT ATRIUM           Index LA diam:      3.60 cm 1.58 cm/m   RA Area:     18.40 cm LA Vol (A2C): 52.1 ml 22.89 ml/m  RA Volume:   49.40 ml  21.70 ml/m LA Vol (A4C): 48.4 ml 21.26 ml/m  AORTIC VALVE AV Area (Vmax):    3.22 cm AV Area (Vmean):   3.70 cm AV Area (VTI):     4.06 cm AV Vmax:           282.00 cm/s AV Vmean:          172.000 cm/s AV VTI:            0.377 m AV Peak Grad:      31.8 mmHg AV Mean Grad:      17.0 mmHg LVOT Vmax:         171.00 cm/s LVOT Vmean:         120.000 cm/s LVOT VTI:          0.288 m LVOT/AV VTI ratio: 0.76  AORTA Ao Root diam: 3.40 cm MITRAL VALVE                TRICUSPID  VALVE MV Area (PHT): 4.09 cm     TR Peak grad:   42.0 mmHg MV Decel Time: 186 msec     TR Vmax:        324.00 cm/s MV E velocity: 135.67 cm/s MV A velocity: 91.80 cm/s   SHUNTS MV E/A ratio:  1.48         Systemic VTI:  0.29 m                             Systemic Diam: 2.60 cm Rudean Haskell MD Electronically signed by Rudean Haskell MD Signature Date/Time: 06/30/2022/12:05:25 PM    Final    DG Tibia/Fibula Right Port  Result Date: 06/30/2022 CLINICAL DATA:  Pain and swelling of the right lower extremity. EXAM: PORTABLE RIGHT TIBIA AND FIBULA - 2 VIEW COMPARISON:  Knee radiographs 09/27/2020 FINDINGS: Significant and markedly progressive knee joint degenerative changes. There also is a large knee joint effusion. This could be a progressive inflammatory/erosive arthropathy but could not exclude septic arthritis. Recommend correlation with clinical findings and joint aspiration as indicated. The tibia and fibula are intact. No destructive bony changes. Scattered vascular calcifications. The ankle joint is maintained. IMPRESSION: 1. Significant and markedly progressive knee joint degenerative changes and large knee joint effusion. Progressive inflammatory arthropathy versus septic arthritis. Recommend correlation with clinical findings and joint aspiration as indicated. 2. Intact tibia and fibula.  No destructive bony changes. Electronically Signed   By: Marijo Sanes M.D.   On: 06/30/2022 09:48   DG Chest Port 1 View  Result Date: 06/30/2022 CLINICAL DATA:  Central line placement. EXAM: PORTABLE CHEST 1 VIEW COMPARISON:  06/30/2022, earlier same day FINDINGS: 0913 hours. Endotracheal tube tip is approximately 3.9 cm above the base of the carina. NG tube tip is in the stomach. Left IJ central line tip projects in the region of the innominate vein confluence and may be  tenting the lateral wall of the vessel given the laterally directed position of the tip. Lung volumes remain low with right greater than left patchy diffuse airspace disease. The cardio pericardial silhouette is enlarged. Telemetry leads overlie the chest. IMPRESSION: Left IJ central line tip projects in the region of the innominate vein confluence and may be tenting the lateral wall of the vessel given the laterally directed position of the tip. No evidence for pneumothorax. Similar appearance of the right greater than left airspace disease. Electronically Signed   By: Misty Stanley M.D.   On: 06/30/2022 09:35   DG Chest Port 1 View  Result Date: 06/30/2022 CLINICAL DATA:  Possible sepsis, rule out aspiration. EXAM: PORTABLE CHEST 1 VIEW COMPARISON:  06/28/2022. FINDINGS: 0533 hours. Developing consolidation in the medial right lower lobe with patchy opacities throughout the right lung, favored to represent aspiration or multifocal pneumonia. New trace right pleural effusion. No pneumothorax. Stable cardiac and mediastinal contours, accounting for patient rotation. IMPRESSION: Developing consolidation in the medial right lower lobe with patchy opacities throughout the right lung, favored to represent aspiration or multifocal pneumonia. New trace right pleural effusion. Electronically Signed   By: Emmit Alexanders M.D.   On: 06/30/2022 08:16   [place] EKG SITE RITE  Result Date: 06/24/2022 If Site Rite image not attached, placement could not be confirmed due to current cardiac rhythm.  CT ABDOMEN PELVIS WO CONTRAST  Result Date: 06/21/2022 CLINICAL DATA:  Acute abdominal pain EXAM: CT ABDOMEN AND PELVIS WITHOUT CONTRAST TECHNIQUE: Multidetector CT imaging of the  abdomen and pelvis was performed following the standard protocol without IV contrast. RADIATION DOSE REDUCTION: This exam was performed according to the departmental dose-optimization program which includes automated exposure control, adjustment of the  mA and/or kV according to patient size and/or use of iterative reconstruction technique. COMPARISON:  03/15/2021 FINDINGS: Lower chest: Blood pool is hypodense compared to the interventricular septum consistent with anemia. Scattered coronary calcifications. Trace pleural fluid. Scattered airspace opacities in the lung bases right greater than left, new since prior. Hepatobiliary: Interval TIPS. Small partially calcified stones layer in the dependent aspect of the gallbladder which is nondilated. No definite liver lesion or biliary ductal dilatation. Pancreas: Unremarkable. No pancreatic ductal dilatation or surrounding inflammatory changes. Spleen: Normal in size without focal abnormality. Adrenals/Urinary Tract: No adrenal mass. No urolithiasis or hydronephrosis. Symmetric renal contours. Urinary bladder decompressed by Foley catheter. Stomach/Bowel: Stomach is nondistended, unremarkable. Small bowel decompressed. Normal appendix. Colon is partially distended by gas and fecal material. Vascular/Lymphatic: Scattered aortoiliac calcified atheromatous plaque without aneurysm. TIPS projects in expected location. No abdominal or pelvic adenopathy. Reproductive: Prostate is unremarkable. Other: Trace pelvic fluid.  No abdominal ascites.  No free air. Musculoskeletal: Spondylitic changes throughout the visualized lower thoracic and lumbosacral spine as before. No fracture or other acute finding. IMPRESSION: 1. Scattered airspace opacities in the lung bases right greater than left, new since prior. 2. Interval TIPS, resolution of abdominal ascites. 3. Cholelithiasis. 4. Coronary and aortic Atherosclerosis (ICD10-I70.0). Electronically Signed   By: Lucrezia Europe M.D.   On: 07/02/2022 16:26   CT Head Wo Contrast  Result Date: 06/24/2022 CLINICAL DATA:  Provided history: Mental status change, unknown cause. EXAM: CT HEAD WITHOUT CONTRAST TECHNIQUE: Contiguous axial images were obtained from the base of the skull through the  vertex without intravenous contrast. RADIATION DOSE REDUCTION: This exam was performed according to the departmental dose-optimization program which includes automated exposure control, adjustment of the mA and/or kV according to patient size and/or use of iterative reconstruction technique. COMPARISON:  No pertinent prior exams available for comparison. FINDINGS: Brain: No age advanced or lobar predominant parenchymal atrophy. There is no acute intracranial hemorrhage. No demarcated cortical infarct. No extra-axial fluid collection. No evidence of an intracranial mass. No midline shift. Vascular: No hyperdense vessel. Skull: No mass or acute finding within the imaged orbits. Sinuses/Orbits: No mass or acute finding within the imaged orbits. Mild mucosal thickening within the bilateral maxillary, sphenoid and ethmoid sinuses. IMPRESSION: 1.  No evidence of an acute intracranial abnormality. 2. Mild paranasal sinus mucosal thickening. Electronically Signed   By: Kellie Simmering D.O.   On: 06/24/2022 16:23   DG Chest Port 1 View  Result Date: 06/20/2022 CLINICAL DATA:  Provided history: Weakness. Altered mental status. Hypoglycemia. EXAM: PORTABLE CHEST 1 VIEW COMPARISON:  Prior chest radiographs 09/07/2021 and earlier. FINDINGS: Heart size at the upper limits of normal. Small ill-defined opacity within the medial right lung base, with an appearance favoring atelectasis. No appreciable airspace consolidation on the left. No evidence of pulmonary edema. No evidence of pleural effusion or pneumothorax. Degenerative changes of the spine. IMPRESSION: Small ill-defined opacity within the medial right lung base, with an appearance favoring atelectasis. Otherwise, no evidence of acute cardiopulmonary abnormality. Electronically Signed   By: Kellie Simmering D.O.   On: 06/30/2022 14:26    Microbiology Recent Results (from the past 240 hour(s))  Culture, blood (routine x 2)     Status: Abnormal   Collection Time: 07/01/2022   2:22 PM   Specimen:  BLOOD  Result Value Ref Range Status   Specimen Description BLOOD SITE NOT SPECIFIED  Final   Special Requests   Final    BOTTLES DRAWN AEROBIC AND ANAEROBIC Blood Culture results may not be optimal due to an inadequate volume of blood received in culture bottles   Culture  Setup Time   Final    GRAM POSITIVE COCCI IN CLUSTERS IN BOTH AEROBIC AND ANAEROBIC BOTTLES CRITICAL RESULT CALLED TO, READ BACK BY AND VERIFIED WITH: PHARMD G ABBOTT 06/30/22 @ 0510 BY AB Performed at [facility], [place] [address] [place], [place] [address]    Culture METHICILLIN RESISTANT STAPHYLOCOCCUS AUREUS (A)  Final   Report Status 07/02/2022 FINAL  Final   Organism ID, Bacteria METHICILLIN RESISTANT STAPHYLOCOCCUS AUREUS  Final      Susceptibility   Methicillin resistant staphylococcus aureus - MIC*    CIPROFLOXACIN <=0.5 SENSITIVE Sensitive     ERYTHROMYCIN >=8 RESISTANT Resistant     GENTAMICIN <=0.5 SENSITIVE Sensitive     OXACILLIN >=4 RESISTANT Resistant     TETRACYCLINE <=1 SENSITIVE Sensitive     VANCOMYCIN <=0.5 SENSITIVE Sensitive     TRIMETH/SULFA <=10 SENSITIVE Sensitive     CLINDAMYCIN <=0.25 SENSITIVE Sensitive     RIFAMPIN <=0.5 SENSITIVE Sensitive     Inducible Clindamycin NEGATIVE Sensitive     * METHICILLIN RESISTANT STAPHYLOCOCCUS AUREUS  Blood Culture ID Panel (Reflexed)     Status: Abnormal   Collection Time: 06/23/2022  2:22 PM  Result Value Ref Range Status   Enterococcus faecalis NOT DETECTED NOT DETECTED Final   Enterococcus Faecium NOT DETECTED NOT DETECTED Final   Listeria monocytogenes NOT DETECTED NOT DETECTED Final   Staphylococcus species DETECTED (A) NOT DETECTED Final    Comment: CRITICAL RESULT CALLED TO, READ BACK BY AND VERIFIED WITH: PHARMD G ABBOTT 06/30/22 @ 0510 BY AB    Staphylococcus aureus (BCID) DETECTED (A) NOT DETECTED Final    Comment: Methicillin (oxacillin)-resistant Staphylococcus aureus (MRSA). MRSA is predictably resistant to  beta-lactam antibiotics (except ceftaroline). Preferred therapy is vancomycin unless clinically contraindicated. Patient requires contact precautions if  hospitalized. CRITICAL RESULT CALLED TO, READ BACK BY AND VERIFIED WITH: PHARMD G ABBOTT 06/30/22 @ 0510 BY AB    Staphylococcus epidermidis NOT DETECTED NOT DETECTED Final   Staphylococcus lugdunensis NOT DETECTED NOT DETECTED Final   Streptococcus species NOT DETECTED NOT DETECTED Final   Streptococcus agalactiae NOT DETECTED NOT DETECTED Final   Streptococcus pneumoniae NOT DETECTED NOT DETECTED Final   Streptococcus pyogenes NOT DETECTED NOT DETECTED Final   A.calcoaceticus-baumannii NOT DETECTED NOT DETECTED Final   Bacteroides fragilis NOT DETECTED NOT DETECTED Final   Enterobacterales NOT DETECTED NOT DETECTED Final   Enterobacter cloacae complex NOT DETECTED NOT DETECTED Final   Escherichia coli NOT DETECTED NOT DETECTED Final   Klebsiella aerogenes NOT DETECTED NOT DETECTED Final   Klebsiella oxytoca NOT DETECTED NOT DETECTED Final   Klebsiella pneumoniae NOT DETECTED NOT DETECTED Final   Proteus species NOT DETECTED NOT DETECTED Final   Salmonella species NOT DETECTED NOT DETECTED Final   Serratia marcescens NOT DETECTED NOT DETECTED Final   Haemophilus influenzae NOT DETECTED NOT DETECTED Final   Neisseria meningitidis NOT DETECTED NOT DETECTED Final   Pseudomonas aeruginosa NOT DETECTED NOT DETECTED Final   Stenotrophomonas maltophilia NOT DETECTED NOT DETECTED Final   Candida albicans NOT DETECTED NOT DETECTED Final   Candida auris NOT DETECTED NOT DETECTED Final   Candida glabrata NOT DETECTED NOT DETECTED Final  Candida krusei NOT DETECTED NOT DETECTED Final   Candida parapsilosis NOT DETECTED NOT DETECTED Final   Candida tropicalis NOT DETECTED NOT DETECTED Final   Cryptococcus neoformans/gattii NOT DETECTED NOT DETECTED Final   Meth resistant mecA/C and MREJ DETECTED (A) NOT DETECTED Final    Comment: CRITICAL  RESULT CALLED TO, READ BACK BY AND VERIFIED WITH: PHARMD G ABBOTT 06/30/22 @ 0510 BY AB Performed at [facility], [place] [address], [place], [place] [address]   Resp panel by RT-PCR (RSV, Flu A&B, Covid) Anterior Nasal Swab     Status: None   Collection Time: 06/20/2022  2:27 PM   Specimen: Anterior Nasal Swab  Result Value Ref Range Status   SARS Coronavirus 2 by RT PCR NEGATIVE NEGATIVE Final   Influenza A by PCR NEGATIVE NEGATIVE Final   Influenza B by PCR NEGATIVE NEGATIVE Final    Comment: (NOTE) The Xpert Xpress SARS-CoV-2/FLU/RSV plus assay is intended as an aid in the diagnosis of influenza from Nasopharyngeal swab specimens and should not be used as a sole basis for treatment. Nasal washings and aspirates are unacceptable for Xpert Xpress SARS-CoV-2/FLU/RSV testing.  Fact Sheet for Patients: EntrepreneurPulse.com.au  Fact Sheet for Healthcare Providers: IncredibleEmployment.be  This test is not yet approved or cleared by the [place] FDA and has been authorized for detection and/or diagnosis of SARS-CoV-2 by FDA under an Emergency Use Authorization (EUA). This EUA will remain in effect (meaning this test can be used) for the duration of the COVID-19 declaration under Section 564(b)(1) of the Act, 21 U.S.C. section 360bbb-3(b)(1), unless the authorization is terminated or revoked.     Resp Syncytial Virus by PCR NEGATIVE NEGATIVE Final    Comment: (NOTE) Fact Sheet for Patients: EntrepreneurPulse.com.au  Fact Sheet for Healthcare Providers: IncredibleEmployment.be  This test is not yet approved or cleared by the [place] FDA and has been authorized for detection and/or diagnosis of SARS-CoV-2 by FDA under an Emergency Use Authorization (EUA). This EUA will remain in effect (meaning this test can be used) for the duration of the COVID-19 declaration under Section 564(b)(1) of the Act,  21 U.S.C. section 360bbb-3(b)(1), unless the authorization is terminated or revoked.  Performed at [facility], [place] [address], [place], [place] [address]   Culture, blood (routine x 2)     Status: Abnormal   Collection Time: 06/20/2022  4:57 PM   Specimen: BLOOD RIGHT HAND  Result Value Ref Range Status   Specimen Description BLOOD RIGHT HAND  Final   Special Requests   Final    BOTTLES DRAWN AEROBIC AND ANAEROBIC Blood Culture results may not be optimal due to an inadequate volume of blood received in culture bottles   Culture  Setup Time   Final    GRAM POSITIVE COCCI IN CLUSTERS IN BOTH AEROBIC AND ANAEROBIC BOTTLES CRITICAL VALUE NOTED.  VALUE IS CONSISTENT WITH PREVIOUSLY REPORTED AND CALLED VALUE.    Culture (A)  Final    STAPHYLOCOCCUS AUREUS SUSCEPTIBILITIES PERFORMED ON PREVIOUS CULTURE WITHIN THE LAST 5 DAYS. Performed at [facility], [place] [address] [place], [place] [address]    Report Status 07/02/2022 FINAL  Final  Culture, blood (Routine X 2) w Reflex to ID Panel     Status: None (Preliminary result)   Collection Time: 06/30/22  7:27 AM   Specimen: BLOOD  Result Value Ref Range Status   Specimen Description BLOOD RIGHT ANTECUBITAL  Final   Special Requests   Final  BOTTLES DRAWN AEROBIC AND ANAEROBIC Blood Culture adequate volume   Culture  Setup Time   Final    GRAM POSITIVE COCCI ANAEROBIC BOTTLE ONLY CRITICAL VALUE NOTED.  VALUE IS CONSISTENT WITH PREVIOUSLY REPORTED AND CALLED VALUE. Performed at [facility], [place] [address] [place], [place] [address]    Culture GRAM POSITIVE COCCI  Final   Report Status PENDING  Incomplete  Culture, blood (Routine X 2) w Reflex to ID Panel     Status: Abnormal   Collection Time: 06/30/22  7:29 AM   Specimen: BLOOD RIGHT HAND  Result Value Ref Range Status   Specimen Description BLOOD RIGHT HAND  Final   Special Requests   Final    BOTTLES DRAWN AEROBIC AND ANAEROBIC Blood Culture adequate volume    Culture  Setup Time   Final    GRAM POSITIVE COCCI IN CLUSTERS IN BOTH AEROBIC AND ANAEROBIC BOTTLES CRITICAL VALUE NOTED.  VALUE IS CONSISTENT WITH PREVIOUSLY REPORTED AND CALLED VALUE.    Culture (A)  Final    STAPHYLOCOCCUS AUREUS SUSCEPTIBILITIES PERFORMED ON PREVIOUS CULTURE WITHIN THE LAST 5 DAYS. Performed at [facility], [place] [address], [place], [place] [address]    Report Status 07/02/2022 FINAL  Final  Culture, Respiratory w Gram Stain     Status: None   Collection Time: 06/30/22  1:33 PM   Specimen: Tracheal Aspirate; Respiratory  Result Value Ref Range Status   Specimen Description TRACHEAL ASPIRATE  Final   Special Requests NONE  Final   Gram Stain   Final    RARE WBC PRESENT, PREDOMINANTLY MONONUCLEAR RARE GRAM POSITIVE COCCI Performed at [facility], [place] [address], [place], [place] [address]    Culture RARE METHICILLIN RESISTANT STAPHYLOCOCCUS AUREUS  Final   Report Status 07/02/2022 FINAL  Final   Organism ID, Bacteria METHICILLIN RESISTANT STAPHYLOCOCCUS AUREUS  Final      Susceptibility   Methicillin resistant staphylococcus aureus - MIC*    CIPROFLOXACIN 2 INTERMEDIATE Intermediate     ERYTHROMYCIN >=8 RESISTANT Resistant     GENTAMICIN <=0.5 SENSITIVE Sensitive     OXACILLIN >=4 RESISTANT Resistant     TETRACYCLINE <=1 SENSITIVE Sensitive     VANCOMYCIN <=0.5 SENSITIVE Sensitive     TRIMETH/SULFA <=10 SENSITIVE Sensitive     CLINDAMYCIN >=8 RESISTANT Resistant     RIFAMPIN <=0.5 SENSITIVE Sensitive     Inducible Clindamycin NEGATIVE Sensitive     * RARE METHICILLIN RESISTANT STAPHYLOCOCCUS AUREUS  Body fluid culture w Gram Stain     Status: None (Preliminary result)   Collection Time: 07/02/22 11:26 AM   Specimen: Body Fluid  Result Value Ref Range Status   Specimen Description FLUID SYNOVIAL RIGHT KNEE  Final   Special Requests NONE  Final   Gram Stain   Final    ABUNDANT WBC PRESENT,BOTH PMN AND MONONUCLEAR ABUNDANT GRAM POSITIVE  COCCI Gram Stain Report Called to,Read Back By and Verified With: RN Lynne Logan [phone] @ [phone] [place] Performed at [facility], [place] [address], [place],  [address]    Culture PENDING  Incomplete   Report Status PENDING  Incomplete    Lab Basic Metabolic Panel: Recent Labs  Lab 06/30/22 1653 06/30/22 2155 07/01/22 0509 07/01/22 0751 07/01/22 1122 07/01/22 1535 07/02/22 0421 07/02/22 1334 2022/07/25 0439  NA  --  126* 128*   < > 131* 129* 130* 128* 132*  K  --  5.5* 5.0   < > 4.6 5.3*  4.7 5.4* 4.6  CL  --  92* 96*  --   --  97* 98 99 99  CO2  --  23 23  --   --  22 21* 21* 18*  GLUCOSE  --  130* 127*  --   --  120* 98 119* 117*  BUN  --  56* 48*  --   --  41* 35* 34* 33*  CREATININE  --  4.58* 3.92*  --   --  3.22* 2.82* 2.48* 2.35*  CALCIUM  --  7.0* 7.1*  --   --  7.4* 7.8* 7.8* 8.3*  MG 2.1 2.2 2.3  --   --   --  2.8*  --  2.7*  PHOS 7.0*  --  5.8*  5.8*  --   --  5.3* 5.5* 4.0 4.4   < > = values in this interval not displayed.   Liver Function Tests: Recent Labs  Lab 06/30/22 0727 06/30/22 1015 07/01/22 0509 07/01/22 1535 07/02/22 0421 07/02/22 1334 07/14/22 0439  AST 136* 145* 145*  --  165*  --  210*  ALT 36 39 39  --  46*  --  61*  ALKPHOS 274* 305* 283*  --  343*  --  352*  BILITOT 10.0* 9.9* 12.5*  --  16.8*  --  18.2*  PROT 5.3* 5.3* 5.8*  --  6.8  --  6.4*  ALBUMIN 2.1* 2.1* 2.6*  2.6* 2.6* 2.8*  2.7* 2.3* 2.4*  2.4*   No results for input(s): "LIPASE", "AMYLASE" in the last 168 hours. Recent Labs  Lab 07/02/2022 1353 06/30/22 1245 07/02/22 0421  AMMONIA 74* 65* 152*   CBC: Recent Labs  Lab 07/01/2022 1353 07/16/2022 1409 06/30/22 0358 06/30/22 0946 06/30/22 1225 07/01/22 0509 07/01/22 0751 07/01/22 1122 07/02/22 0421 07/02/22 0915 2022-07-14 0439  WBC 20.2*  --  36.8*  --   --  30.6*  --   --  31.0*  --  25.5*  NEUTROABS 18.6*  --   --   --   --   --   --   --   --   --   --   HGB 7.9*   < > 7.6*   < >  --  7.5* 7.5* 9.2* 9.1*  --   9.2*  HCT 23.8*   < > 21.7*   < >  --  22.4* 22.0* 27.0* 26.9*  --  28.2*  MCV 108.2*  --  103.3*  --   --  106.7*  --   --  107.6*  --  108.5*  PLT 40*  --  32*  --  59* 32*  --   --  25* 25* 22*   < > = values in this interval not displayed.   Cardiac Enzymes: Recent Labs  Lab 07/10/2022 1353  CKTOTAL 391   Sepsis Labs: Recent Labs  Lab 07/08/2022 2010 06/28/2022 2220 06/30/22 0358 06/30/22 1011 07/01/22 0509 07/02/22 0421 07-14-2022 0439  WBC  --   --  36.8*  --  30.6* 31.0* 25.5*  LATICACIDVEN 6.6* 5.7*  --  3.5* 3.0*  --   --     Procedures/Operations   3/12 intubation 3/12 HD catheter 3/12 CVC 3/12 arterial line 3/13 cardioversion x 2 3/14 R knee arthrocentesis    Laurel Dimmer Emmersyn Kratzke 07/14/22, 12:36 PM

## 2022-07-20 NOTE — Progress Notes (Signed)
After comfort orders placed, patient was then extubated. After extubation, morphine gtt was initiated and PRN robinul given. After max out on morphine gtt with some respiratory distress, versed gtt initiated and then max out. Another PRN robinul given. Patient appears to be in much less distress. Pressors initially halved then turned off. CRRT disconnected and discontinued. Family at bedside with patient and updated throughout the whole process.  Montez Hageman RN

## 2022-07-20 NOTE — Progress Notes (Signed)
Nutrition Brief Note  Chart reviewed. Pt has now transitioned to comfort care.   No further nutrition interventions planned at this time. Please re-consult as needed.   Aleicia Kenagy RD, LDN Clinical Dietitian See AMiON for contact information.   

## 2022-07-20 NOTE — Progress Notes (Signed)
Brief prog note  Worse Hemodynamic instability after bath. Progressed to irreg rhthym w fq PVCs some runs, and poor CO. Not pulling on crrt.  Vent dyssynchrony + hypoxia not improved w suctioning, high peaks/plats   Similar to his course a few days ago when he had SVT/ VT and req CV    45mcg fent bolus  Bolus 150 amio 1g CaCl 2 amp bicarb   Family to arrive momentarily for goc discussion. This is not survivable and recommendation will be for DNR, and compassionate transition to comfort care     Add'l cct 34 min    Eliseo Gum MSN, AGACNP-BC [place] 07/08/22, 10:23 AM

## 2022-07-20 NOTE — Progress Notes (Signed)
Pharmacy Antibiotic Note  Kyle Pitts is a 62 y.o. male admitted on 07/09/2022 with bacteremia.  Pharmacy has been consulted for vancomycin dosing. WBC 36.8 now afebrile with elevated lactic acid on escalating vasopressor support (NE @ 20; vaso@0 .03) after fluid resuscitation.   VR 13 mcg/mL - subtherapeutic   Plan: Increase vancomycin 1500mg  IV q24h while on CRRT -vanc levels PRN per protocol  Height: 6' (182.9 cm) Weight: 107.7 kg (237 lb 7 oz) IBW/kg (Calculated) : 77.6  Temp (24hrs), Avg:98.5 F (36.9 C), Min:98.2 F (36.8 C), Max:98.8 F (37.1 C)  Recent Labs  Lab 07/08/2022 1353 06/24/2022 1410 07/17/2022 1805 07/02/2022 1930 07/05/2022 2010 06/28/2022 2220 06/30/22 0358 06/30/22 0727 06/30/22 1011 06/30/22 1015 07/01/22 0509 07/01/22 1535 07/02/22 0421 07/02/22 1334 2022-07-20 0439  WBC 20.2*  --   --   --   --   --  36.8*  --   --   --  30.6*  --  31.0*  --  25.5*  CREATININE 5.42*   < >  --    < >  --   --  5.68*   < >  --    < > 3.92* 3.22* 2.82* 2.48* 2.35*  LATICACIDVEN 5.7*  --  7.0*  --  6.6* 5.7*  --   --  3.5*  --  3.0*  --   --   --   --   VANCORANDOM  --   --   --   --   --   --   --    < >  --   --  13  --  13  --   --    < > = values in this interval not displayed.     Estimated Creatinine Clearance: 42.4 mL/min (A) (by C-G formula based on SCr of 2.35 mg/dL (H)).    Allergies  Allergen Reactions   Latex Itching   Tape Itching   Amoxicillin Itching    Itching around eyes and scalp on amoxicilin   Cephalexin Nausea Only    Upset stomach Ceftriaxone is ok   Penicillins Rash    Thank you for allowing pharmacy to be a part of this patient's care.  Alanda Slim, PharmD, [facility] Clinical Pharmacist Please see AMION for all Pharmacists' Contact Phone Numbers 07-20-2022, 7:10 AM

## 2022-07-20 NOTE — Progress Notes (Addendum)
Subjective:  Patient had been extubated was in the process of dying when I came in the room to talk with one of his family members  Antibiotics:  Anti-infectives (From admission, onward)    Start     Dose/Rate Route Frequency Ordered Stop   2022/07/10 1200  vancomycin (VANCOREADY) IVPB 1500 mg/300 mL  Status:  Discontinued        1,500 mg 150 mL/hr over 120 Minutes Intravenous Every 24 hours 07/10/2022 0709 2022-07-10 1032   07/01/22 1500  vancomycin (VANCOCIN) IVPB 1000 mg/200 mL premix  Status:  Discontinued        1,000 mg 200 mL/hr over 60 Minutes Intravenous Every 48 hours 07/15/2022 1455 07/02/2022 1635   06/30/22 1200  vancomycin (VANCOREADY) IVPB 1250 mg/250 mL  Status:  Discontinued        1,250 mg 166.7 mL/hr over 90 Minutes Intravenous Every 24 hours 06/30/22 1114 10-Jul-2022 0709   06/30/22 1045  linezolid (ZYVOX) IVPB 600 mg  Status:  Discontinued        600 mg 300 mL/hr over 60 Minutes Intravenous Every 12 hours 06/30/22 0949 06/30/22 0957   07/07/2022 1634  vancomycin variable dose per unstable renal function (pharmacist dosing)  Status:  Discontinued         Does not apply See admin instructions 07/12/2022 1635 06/30/22 0949   07/17/2022 1530  metroNIDAZOLE (FLAGYL) IVPB 500 mg        500 mg 100 mL/hr over 60 Minutes Intravenous  Once 06/22/2022 1528 07/04/2022 1925   07/18/2022 1445  vancomycin (VANCOCIN) IVPB 1000 mg/200 mL premix  Status:  Discontinued       See Hyperspace for full Linked Orders Report.   1,000 mg 200 mL/hr over 60 Minutes Intravenous  Once 07/01/2022 1432 06/30/22 1114   06/20/2022 1445  vancomycin (VANCOCIN) IVPB 1000 mg/200 mL premix       See Hyperspace for full Linked Orders Report.   1,000 mg 200 mL/hr over 60 Minutes Intravenous  Once 06/22/2022 1432 07/12/2022 1828   07/02/2022 1445  ceFEPIme (MAXIPIME) 2 g in sodium chloride 0.9 % 100 mL IVPB  Status:  Discontinued        2 g 200 mL/hr over 30 Minutes Intravenous Every 24 hours 07/08/2022 1432 06/30/22 0820        Medications: Scheduled Meds:   Continuous Infusions:  sodium chloride     fentaNYL infusion INTRAVENOUS Stopped (10-Jul-2022 1030)   midazolam 2 mg/hr (10-Jul-2022 1039)   morphine 15 mg/hr (10-Jul-2022 1214)   PRN Meds:.Place/Maintain arterial line **AND** sodium chloride, acetaminophen **OR** acetaminophen, fentaNYL, glycopyrrolate **OR** glycopyrrolate **OR** glycopyrrolate, midazolam, midazolam, midazolam, ondansetron (ZOFRAN) IV, mouth rinse, polyvinyl alcohol    Objective: Weight change: 0.1 kg  Intake/Output Summary (Last 24 hours) at 10-Jul-2022 1345 Last data filed at 07-10-2022 1000 Gross per 24 hour  Intake 3326.98 ml  Output 4017.7 ml  Net -690.72 ml    Blood pressure 110/76, pulse (!) 53, temperature 98.6 F (37 C), resp. rate (!) 0, height 6' (1.829 m), weight 107.7 kg, SpO2 (!) 17 %. Temp:  [98.2 F (36.8 C)-98.8 F (37.1 C)] 98.6 F (37 C) 2022-07-10 1100) Pulse Rate:  [53-148] 53 07/10/22 1100) Resp:  [0-24] 0 2022-07-10 1100) BP: (109-141)/(54-97) 110/76 2022-07-10 1000) SpO2:  [17 %-100 %] 17 % 10-Jul-2022 1100) Arterial Line BP: (42-138)/(26-82) 42/26 10-Jul-2022 1100) FiO2 (%):  [40 %] 40 % 07-10-2022 0829) Weight:  [107.7 kg] 107.7 kg 07/10/2022 1200)  Physical Exam: Physical Exam   Patient with rhonchi, actively dying  CBC:    BMET Recent Labs    07/02/22 1334 August 02, 2022 0439  NA 128* 132*  K 5.4* 4.6  CL 99 99  CO2 21* 18*  GLUCOSE 119* 117*  BUN 34* 33*  CREATININE 2.48* 2.35*  CALCIUM 7.8* 8.3*      Liver Panel  Recent Labs    07/02/22 0421 07/02/22 1334 08/02/22 0439  PROT 6.8  --  6.4*  ALBUMIN 2.8*  2.7* 2.3* 2.4*  2.4*  AST 165*  --  210*  ALT 46*  --  61*  ALKPHOS 343*  --  352*  BILITOT 16.8*  --  18.2*  BILIDIR 8.3*  --  9.3*  IBILI 8.5*  --  8.9*        Sedimentation Rate No results for input(s): "ESRSEDRATE" in the last 72 hours. C-Reactive Protein No results for input(s): "CRP" in the last 72 hours.  Micro Results: Recent  Results (from the past 720 hour(s))  Culture, blood (routine x 2)     Status: Abnormal   Collection Time: 07/11/2022  2:22 PM   Specimen: BLOOD  Result Value Ref Range Status   Specimen Description BLOOD SITE NOT SPECIFIED  Final   Special Requests   Final    BOTTLES DRAWN AEROBIC AND ANAEROBIC Blood Culture results may not be optimal due to an inadequate volume of blood received in culture bottles   Culture  Setup Time   Final    GRAM POSITIVE COCCI IN CLUSTERS IN BOTH AEROBIC AND ANAEROBIC BOTTLES CRITICAL RESULT CALLED TO, READ BACK BY AND VERIFIED WITH: PHARMD G ABBOTT 06/30/22 @ 0510 BY AB Performed at [facility], [place] [address], [place], [place] [address]    Culture METHICILLIN RESISTANT STAPHYLOCOCCUS AUREUS (A)  Final   Report Status 07/02/2022 FINAL  Final   Organism ID, Bacteria METHICILLIN RESISTANT STAPHYLOCOCCUS AUREUS  Final      Susceptibility   Methicillin resistant staphylococcus aureus - MIC*    CIPROFLOXACIN <=0.5 SENSITIVE Sensitive     ERYTHROMYCIN >=8 RESISTANT Resistant     GENTAMICIN <=0.5 SENSITIVE Sensitive     OXACILLIN >=4 RESISTANT Resistant     TETRACYCLINE <=1 SENSITIVE Sensitive     VANCOMYCIN <=0.5 SENSITIVE Sensitive     TRIMETH/SULFA <=10 SENSITIVE Sensitive     CLINDAMYCIN <=0.25 SENSITIVE Sensitive     RIFAMPIN <=0.5 SENSITIVE Sensitive     Inducible Clindamycin NEGATIVE Sensitive     * METHICILLIN RESISTANT STAPHYLOCOCCUS AUREUS  Blood Culture ID Panel (Reflexed)     Status: Abnormal   Collection Time: 06/23/2022  2:22 PM  Result Value Ref Range Status   Enterococcus faecalis NOT DETECTED NOT DETECTED Final   Enterococcus Faecium NOT DETECTED NOT DETECTED Final   Listeria monocytogenes NOT DETECTED NOT DETECTED Final   Staphylococcus species DETECTED (A) NOT DETECTED Final    Comment: CRITICAL RESULT CALLED TO, READ BACK BY AND VERIFIED WITH: PHARMD G ABBOTT 06/30/22 @ 0510 BY AB    Staphylococcus aureus (BCID) DETECTED (A) NOT DETECTED  Final    Comment: Methicillin (oxacillin)-resistant Staphylococcus aureus (MRSA). MRSA is predictably resistant to beta-lactam antibiotics (except ceftaroline). Preferred therapy is vancomycin unless clinically contraindicated. Patient requires contact precautions if  hospitalized. CRITICAL RESULT CALLED TO, READ BACK BY AND VERIFIED WITH: PHARMD G ABBOTT 06/30/22 @ 0510 BY AB    Staphylococcus epidermidis NOT DETECTED NOT DETECTED Final   Staphylococcus lugdunensis NOT DETECTED NOT DETECTED  Final   Streptococcus species NOT DETECTED NOT DETECTED Final   Streptococcus agalactiae NOT DETECTED NOT DETECTED Final   Streptococcus pneumoniae NOT DETECTED NOT DETECTED Final   Streptococcus pyogenes NOT DETECTED NOT DETECTED Final   A.calcoaceticus-baumannii NOT DETECTED NOT DETECTED Final   Bacteroides fragilis NOT DETECTED NOT DETECTED Final   Enterobacterales NOT DETECTED NOT DETECTED Final   Enterobacter cloacae complex NOT DETECTED NOT DETECTED Final   Escherichia coli NOT DETECTED NOT DETECTED Final   Klebsiella aerogenes NOT DETECTED NOT DETECTED Final   Klebsiella oxytoca NOT DETECTED NOT DETECTED Final   Klebsiella pneumoniae NOT DETECTED NOT DETECTED Final   Proteus species NOT DETECTED NOT DETECTED Final   Salmonella species NOT DETECTED NOT DETECTED Final   Serratia marcescens NOT DETECTED NOT DETECTED Final   Haemophilus influenzae NOT DETECTED NOT DETECTED Final   Neisseria meningitidis NOT DETECTED NOT DETECTED Final   Pseudomonas aeruginosa NOT DETECTED NOT DETECTED Final   Stenotrophomonas maltophilia NOT DETECTED NOT DETECTED Final   Candida albicans NOT DETECTED NOT DETECTED Final   Candida auris NOT DETECTED NOT DETECTED Final   Candida glabrata NOT DETECTED NOT DETECTED Final   Candida krusei NOT DETECTED NOT DETECTED Final   Candida parapsilosis NOT DETECTED NOT DETECTED Final   Candida tropicalis NOT DETECTED NOT DETECTED Final   Cryptococcus neoformans/gattii NOT  DETECTED NOT DETECTED Final   Meth resistant mecA/C and MREJ DETECTED (A) NOT DETECTED Final    Comment: CRITICAL RESULT CALLED TO, READ BACK BY AND VERIFIED WITH: PHARMD G ABBOTT 06/30/22 @ 0510 BY AB Performed at [facility], [address], [place], [place] [address]   Resp panel by RT-PCR (RSV, Flu A&B, Covid) Anterior Nasal Swab     Status: None   Collection Time: 06/24/2022  2:27 PM   Specimen: Anterior Nasal Swab  Result Value Ref Range Status   SARS Coronavirus 2 by RT PCR NEGATIVE NEGATIVE Final   Influenza A by PCR NEGATIVE NEGATIVE Final   Influenza B by PCR NEGATIVE NEGATIVE Final    Comment: (NOTE) The Xpert Xpress SARS-CoV-2/FLU/RSV plus assay is intended as an aid in the diagnosis of influenza from Nasopharyngeal swab specimens and should not be used as a sole basis for treatment. Nasal washings and aspirates are unacceptable for Xpert Xpress SARS-CoV-2/FLU/RSV testing.  Fact Sheet for Patients: EntrepreneurPulse.com.au  Fact Sheet for Healthcare Providers: IncredibleEmployment.be  This test is not yet approved or cleared by the [place] FDA and has been authorized for detection and/or diagnosis of SARS-CoV-2 by FDA under an Emergency Use Authorization (EUA). This EUA will remain in effect (meaning this test can be used) for the duration of the COVID-19 declaration under Section 564(b)(1) of the Act, 21 U.S.C. section 360bbb-3(b)(1), unless the authorization is terminated or revoked.     Resp Syncytial Virus by PCR NEGATIVE NEGATIVE Final    Comment: (NOTE) Fact Sheet for Patients: EntrepreneurPulse.com.au  Fact Sheet for Healthcare Providers: IncredibleEmployment.be  This test is not yet approved or cleared by the [place] FDA and has been authorized for detection and/or diagnosis of SARS-CoV-2 by FDA under an Emergency Use Authorization (EUA). This EUA will remain in  effect (meaning this test can be used) for the duration of the COVID-19 declaration under Section 564(b)(1) of the Act, 21 U.S.C. section 360bbb-3(b)(1), unless the authorization is terminated or revoked.  Performed at [facility], [place] [address], [place], [place] [address]   Culture, blood (routine x 2)  Status: Abnormal   Collection Time: 07/02/2022  4:57 PM   Specimen: BLOOD RIGHT HAND  Result Value Ref Range Status   Specimen Description BLOOD RIGHT HAND  Final   Special Requests   Final    BOTTLES DRAWN AEROBIC AND ANAEROBIC Blood Culture results may not be optimal due to an inadequate volume of blood received in culture bottles   Culture  Setup Time   Final    GRAM POSITIVE COCCI IN CLUSTERS IN BOTH AEROBIC AND ANAEROBIC BOTTLES CRITICAL VALUE NOTED.  VALUE IS CONSISTENT WITH PREVIOUSLY REPORTED AND CALLED VALUE.    Culture (A)  Final    STAPHYLOCOCCUS AUREUS SUSCEPTIBILITIES PERFORMED ON PREVIOUS CULTURE WITHIN THE LAST 5 DAYS. Performed at [facility], [place] [address] [place], [place] [address]    Report Status 07/02/2022 FINAL  Final  Culture, blood (Routine X 2) w Reflex to ID Panel     Status: None (Preliminary result)   Collection Time: 06/30/22  7:27 AM   Specimen: BLOOD  Result Value Ref Range Status   Specimen Description BLOOD RIGHT ANTECUBITAL  Final   Special Requests   Final    BOTTLES DRAWN AEROBIC AND ANAEROBIC Blood Culture adequate volume   Culture  Setup Time   Final    GRAM POSITIVE COCCI ANAEROBIC BOTTLE ONLY CRITICAL VALUE NOTED.  VALUE IS CONSISTENT WITH PREVIOUSLY REPORTED AND CALLED VALUE. Performed at [facility], [place] [address], [place], [place] [address]    Culture GRAM POSITIVE COCCI  Final   Report Status PENDING  Incomplete  Culture, blood (Routine X 2) w Reflex to ID Panel     Status: Abnormal   Collection Time: 06/30/22  7:29 AM   Specimen: BLOOD RIGHT HAND  Result Value Ref Range Status   Specimen Description  BLOOD RIGHT HAND  Final   Special Requests   Final    BOTTLES DRAWN AEROBIC AND ANAEROBIC Blood Culture adequate volume   Culture  Setup Time   Final    GRAM POSITIVE COCCI IN CLUSTERS IN BOTH AEROBIC AND ANAEROBIC BOTTLES CRITICAL VALUE NOTED.  VALUE IS CONSISTENT WITH PREVIOUSLY REPORTED AND CALLED VALUE.    Culture (A)  Final    STAPHYLOCOCCUS AUREUS SUSCEPTIBILITIES PERFORMED ON PREVIOUS CULTURE WITHIN THE LAST 5 DAYS. Performed at [facility], [place] [address], [place], [place] [address]    Report Status 07/02/2022 FINAL  Final  Culture, Respiratory w Gram Stain     Status: None   Collection Time: 06/30/22  1:33 PM   Specimen: Tracheal Aspirate; Respiratory  Result Value Ref Range Status   Specimen Description TRACHEAL ASPIRATE  Final   Special Requests NONE  Final   Gram Stain   Final    RARE WBC PRESENT, PREDOMINANTLY MONONUCLEAR RARE GRAM POSITIVE COCCI Performed at [facility], [place] [address], [place], [place] [address]    Culture RARE METHICILLIN RESISTANT STAPHYLOCOCCUS AUREUS  Final   Report Status 07/02/2022 FINAL  Final   Organism ID, Bacteria METHICILLIN RESISTANT STAPHYLOCOCCUS AUREUS  Final      Susceptibility   Methicillin resistant staphylococcus aureus - MIC*    CIPROFLOXACIN 2 INTERMEDIATE Intermediate     ERYTHROMYCIN >=8 RESISTANT Resistant     GENTAMICIN <=0.5 SENSITIVE Sensitive     OXACILLIN >=4 RESISTANT Resistant     TETRACYCLINE <=1 SENSITIVE Sensitive     VANCOMYCIN <=0.5 SENSITIVE Sensitive     TRIMETH/SULFA <=10 SENSITIVE Sensitive     CLINDAMYCIN >=8 RESISTANT  Resistant     RIFAMPIN <=0.5 SENSITIVE Sensitive     Inducible Clindamycin NEGATIVE Sensitive     * RARE METHICILLIN RESISTANT STAPHYLOCOCCUS AUREUS  Body fluid culture w Gram Stain     Status: None (Preliminary result)   Collection Time: 07/02/22 11:26 AM   Specimen: Body Fluid  Result Value Ref Range Status   Specimen Description FLUID SYNOVIAL RIGHT KNEE  Final    Special Requests NONE  Final   Gram Stain   Final    ABUNDANT WBC PRESENT,BOTH PMN AND MONONUCLEAR ABUNDANT GRAM POSITIVE COCCI Gram Stain Report Called to,Read Back By and Verified With: RN Lynne Logan [phone] @ [phone] [place] Performed at [facility], [place] [address] [place],  [address]    Culture PENDING  Incomplete   Report Status PENDING  Incomplete    Studies/Results: DG Chest Port 1 View  Result Date: 07/02/2022 CLINICAL DATA:  Respiratory failure. EXAM: PORTABLE CHEST 1 VIEW COMPARISON:  July 01, 2022 FINDINGS: EKG leads project over the chest. Endotracheal tube terminates approximately 3.8 cm from the carina, tip between clavicular heads. LEFT-sided central venous access device terminates at the area of the brachiocephalic junction in the upper SVC. RIGHT vas cath terminates in the area the caval to atrial junction. Gastric tube courses through in off the field of the radiograph tip in the LEFT upper quadrant. Cardiomediastinal contours and hilar structures are stable. No pneumothorax. Interstitial and alveolar opacities in the chest are similar to prior imaging. On limited assessment no acute skeletal findings. IMPRESSION: 1. No significant interval change in the appearance of the chest. 2. Support lines and tubes as described. 3. Bilateral interstitial and alveolar/airspace disease in the chest are similar to prior imaging. Electronically Signed   By: Zetta Bills M.D.   On: 07/02/2022 08:01      Assessment/Plan:  INTERVAL HISTORY:   Pivot to palliative care and terminal wean this am with patient having passed away later than my visit  Principal Problem:   MRSA bacteremia Active Problems:   Acute metabolic encephalopathy   Septic arthritis ([place])   Septic shock (HCC)   Chronic venous hypertension (idiopathic) with ulcer and inflammation of right lower extremity (HCC)   Sustained ventricular tachycardia (HCC)   PSVT (paroxysmal supraventricular tachycardia)    Coagulopathy (HCC)   Goals of care, counseling/discussion   Hyperkalemia   Encephalopathy acute   Other ventricular tachycardia ([place])   DNR (do not resuscitate)   NSVT (nonsustained ventricular tachycardia) ([place])    Kyle Pitts is a 62 y.o. male with history of alcoholic cirrhosis prior group A streptococcal septic joint status post I&D, history of TIPS with recurrent large-volume ascites now admitted with hypothermia and septic shock due to MRSA renal failure requiring CRRT, hepatic failure, coagulapathy  He has a large effusion on the right knee and indeed that aspirate revealed grossly purulent material  Morning after family meeting decision was made to extubate the patient with "terminal wean.  This was done and he has subsequently expired.  I do think this was the correct and compassionate move to make and told his family member my opinion.  I spent 51 minutes with the patient including 50% of the time in face to face and non face to face time along with review of medical records in preparation for the visit and during the visit and in coordination of his care.     LOS: 4 days   Alcide Evener 2022/07/21, 1:45 PM

## 2022-07-20 NOTE — Progress Notes (Signed)
   Jul 04, 2022 1127  Spiritual Encounters  Type of Visit Initial  Care provided to: Pt and family  Conversation partners present during encounter Nurse  Referral source Nurse (RN/NT/LPN)  Reason for visit End-of-life  OnCall Visit No  Interventions  Spiritual Care Interventions Made Established relationship of care and support;Compassionate presence;Bereavement/grief support;Prayer;Encouragement;Supported grief process   Kyle Pitts responded to Spiritual Consult for end of life care.  Kyle Pitts found family surrounding bed in varius states of grieving.  Kyle Pitts provided compassionate presence, and encouraged family to speak to the PT as it is likely that he can hear them.  Chap offered prayer for PT and family.

## 2022-07-20 DEATH — deceased

## 2023-05-01 IMAGING — US IR TIPS
1 series · 2 of 2 positions shown · non-contrast
Comparison: none

CLINICAL DATA: Wilchy Walna is a 60 y.o. male with history of
alcoholic cirrhosis and recurrent ascites (Cherrelle Fleischer, MELD 22)
with a degree of hepatorenal syndrome affecting maximization of
diuresis. Given that elevated MELD is primarily due to renal
function, TIPS creation would likely benefit and reverse this
process in addition to providing relief from ascites production.

[Series 1: ir tips · 2 of 2 slices shown]
[im 1/2]
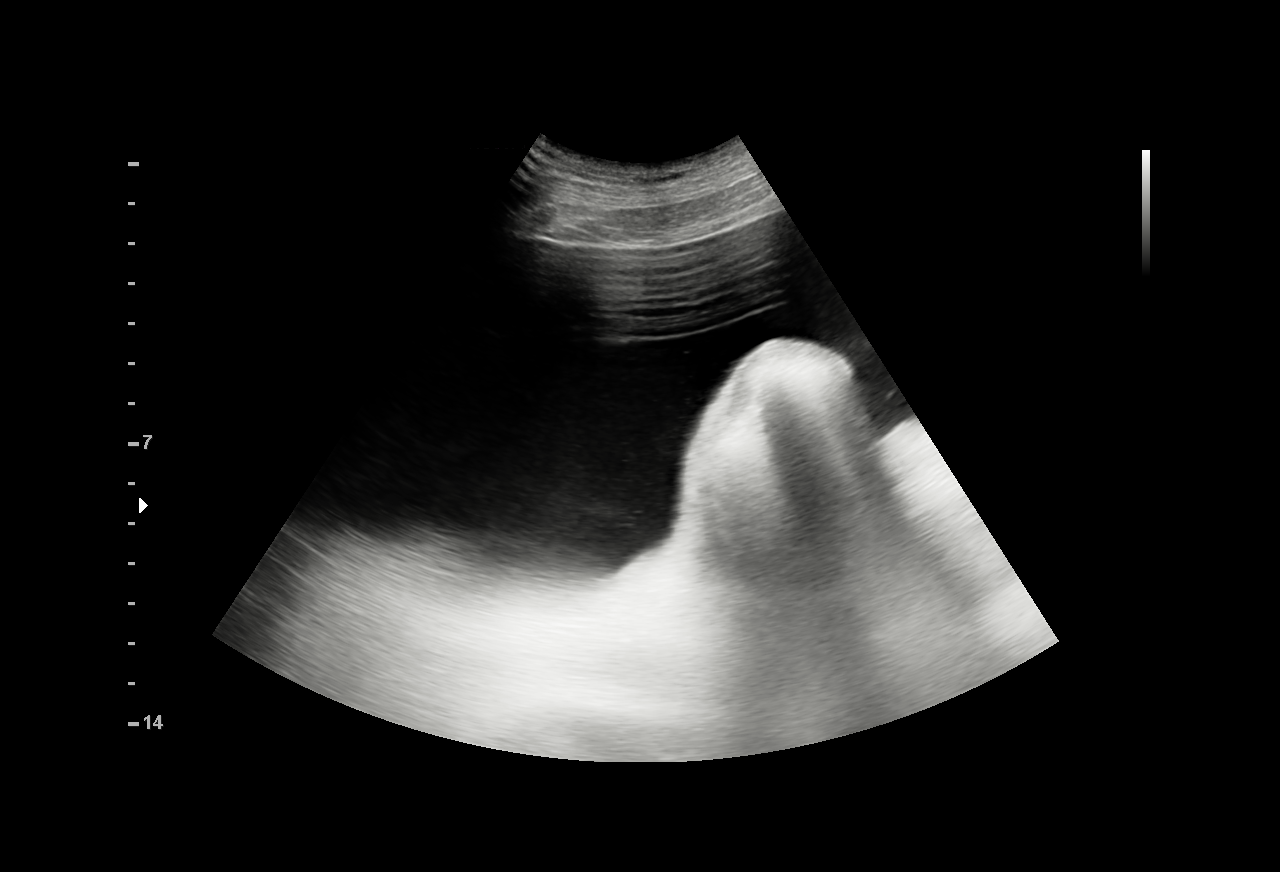
[im 2/2]
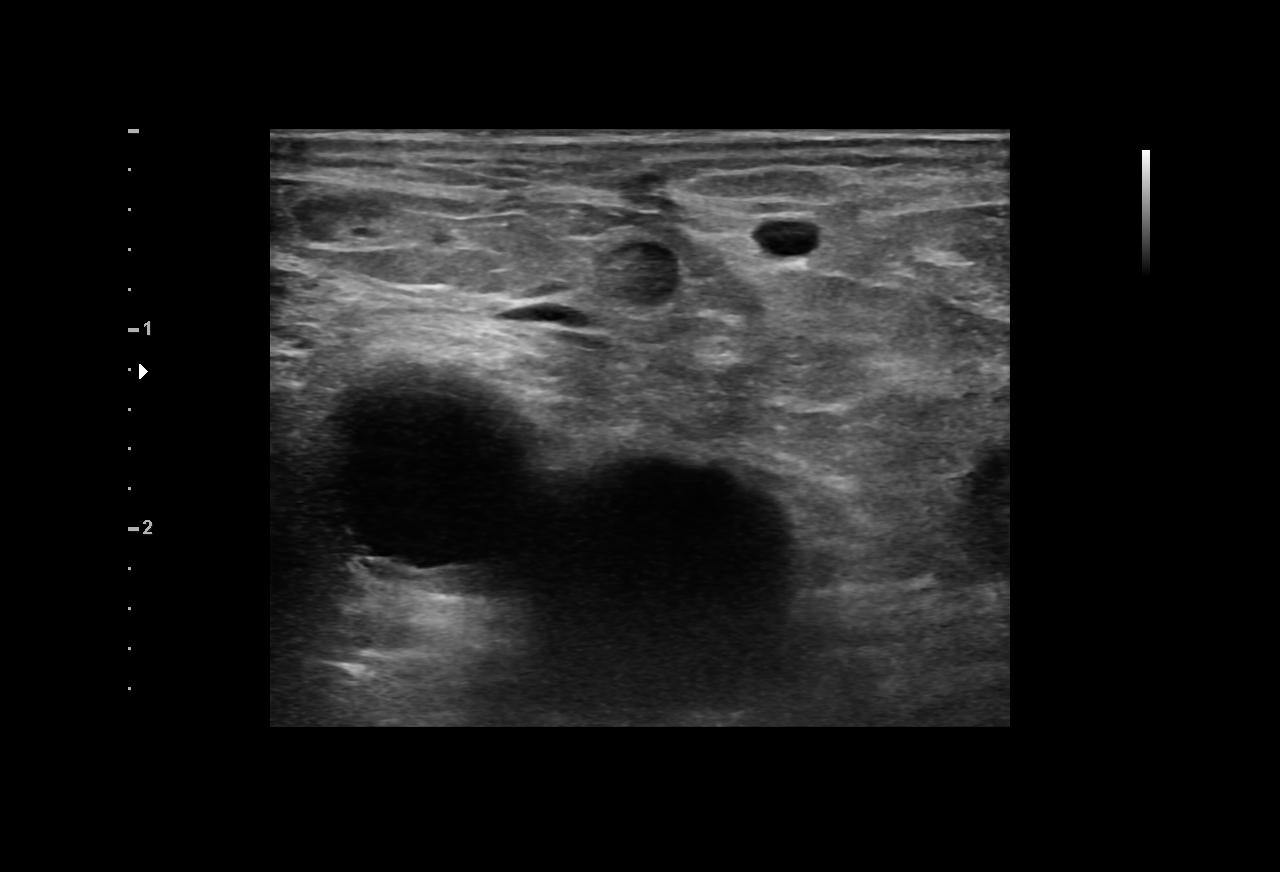

[2 of 2 positions shown; findings below may reference images not displayed]

EXAM:
1. Ultrasound-guided paracentesis
2. Ultrasound-guided access of the right internal jugular vein
3. Ultrasound-guided access of the right common femoral vein
4. Hepatic venogram
5. Intravascular ultrasound
6. Catheterization of the portal vein
7. Portal venous and central manometry
8. Portal venogram
9. Creation of a transhepatic portal vein to hepatic vein shunt

MEDICATIONS:
As antibiotic prophylaxis, Levaquin 500 mg IV was ordered
pre-procedure and administered intravenously within one hour of
incision.

ANESTHESIA/SEDATION:
General - as administered by the Anesthesia department

CONTRAST:  100 ML Omnipaque 300, intravenous

FLUOROSCOPY TIME:  480 mGy

COMPLICATIONS:
None immediate.

PROCEDURE:
Informed written consent was obtained from the patient after a
thorough discussion of the procedural risks, benefits and
alternatives. All questions were addressed. Maximal Sterile Barrier
Technique was utilized including caps, mask, sterile gowns, sterile
gloves, sterile drape, hand hygiene and skin antiseptic. A timeout
was performed prior to the initiation of the procedure.

A preliminary ultrasound of the right groin was performed and
demonstrates a patent right common femoral vein. A permanent
ultrasound image was recorded. Using a combination of fluoroscopy
and ultrasound, an access site was determined. A small dermatotomy
was made at the planned puncture site. Using ultrasound guidance,
access into the right common femoral vein was obtained with
visualization of needle entry into the vessel using a standard
micropuncture technique. A wire was advanced into the IVC insert all
fascial dilation performed. An 8 French, 11 cm vascular sheath was
placed into the external iliac vein. Through this access site, an 8
French Accunav ICE catheter was advanced with ease under
fluoroscopic guidance to the level of the intrahepatic inferior vena
cava.

A preliminary ultrasound of the right neck was performed and
demonstrates a patent internal jugular vein. A permanent ultrasound
image was recorded. Using a combination of fluoroscopy and
ultrasound, an access site was determined. A small dermatotomy was
made at the planned puncture site. Using ultrasound guidance, access
into the right internal jugular vein was obtained with visualization
of needle entry into the vessel using a standard micropuncture
technique. A wire was advanced into the IVC and serial fascial
dilation performed. A 9 French tips sheath was placed into the
internal jugular vein and advanced to the IVC.

The jugular sheath was retracted into the right atrium and manometry
was performed. A 5 French angled tip catheter was then directed into
the right hepatic vein. Hepatic venogram was performed. These images
demonstrated patent hepatic vein with no stenosis. The catheter was
advanced to a wedge portion of the a patent vein over which the 9
French sheath was advanced into the right hepatic vein.

Using ICE ultrasound visualization the catheter and right hepatic
vein as well as the portal anatomy was defined. A planned exit site
from the hepatic vein and puncture site from the portal vein was
placed into a single sonographic plane. Under direct ultrasound
visualization, the score pNX needle was attempted to be advanced
into the central right portal vein, without success. Therefore,
using ICE ultrasound visualization the catheter was redirected into
the middle hepatic vein. A planned exit site from the hepatic vein
and puncture site from the portal vein was placed into a single
sonographic plane. Under direct ultrasound visualization, the score
pNX needle was advanced into the central right portal vein. Hand
injection of contrast confirmed position within the portal system. A
Glidewire Advantage was then advanced into the superior mesenteric
vein. A 5 French marking pigtail catheter was then advanced over the
wire into the main portal vein and wire removed. Portal venogram was
performed which demonstrated a patent portal vein. A few very small
varices were seen arising from the main portal vein. Portal
manometry was then performed. The indwelling 9 French sheath was
then exchanged over the wire for a 10 French, 45 cm dry seal sheath
for anticipated endograft deployment. The tract was then dilated to
8 mm with an 8 mm x 100 mm Athletis balloon. A 8-10 mm by 7 + 2 mm
of Viatorr was placed. This was ultimately dilated to 8 mm. After
placement of the shunt, right atrial and portal pressures were
repeated. Completion portal venogram demonstrates a patent TIPS
endograft without significant gastroesophageal varices.

The catheters and sheath were removed and manual compression was
applied to the right internal jugular and right common femoral
venous access sites until hemostasis was achieved. The patient was
transferred to the PACU in stable condition.

Pre-TIPS Mean Pressures (mmHg):

Right atrium: 13

Portal vein: 27

Portosystemic gradient: 14

Post-TIPS Mean Pressures (mmHg):

Right atrium:16

Portal vein: 23

Portosystemic gradient: 7
IMPRESSION: 1. Successful transjugular portosystemic shunt creation.
2. Portosystemic gradient of 14 mm Hg (absolute portal venous
pressure 27 mm Hg) before shunt placement and 7 mm Hg (absolute
portal venous pressure 23 mm Hg) after shunt placement.

PLAN:
Interventional Radiology Portal Hypertension Clinic will follow up
closely with patient after discharge. Plan for 1 month in person
clinic visit.

## 2023-05-20 IMAGING — US IR PARACENTESIS
1 series · 1 of 1 positions shown · non-contrast
Comparison: none

INDICATION: Patient with history of alcoholic cirrhosis, recurrent ascites,
status post tips placement 09/05/2021. Request to IR for diagnostic
and therapeutic paracentesis.

[Series 1: ir (person_name)/(person_name) · 1 of 1 slices shown]
[im 1/1]
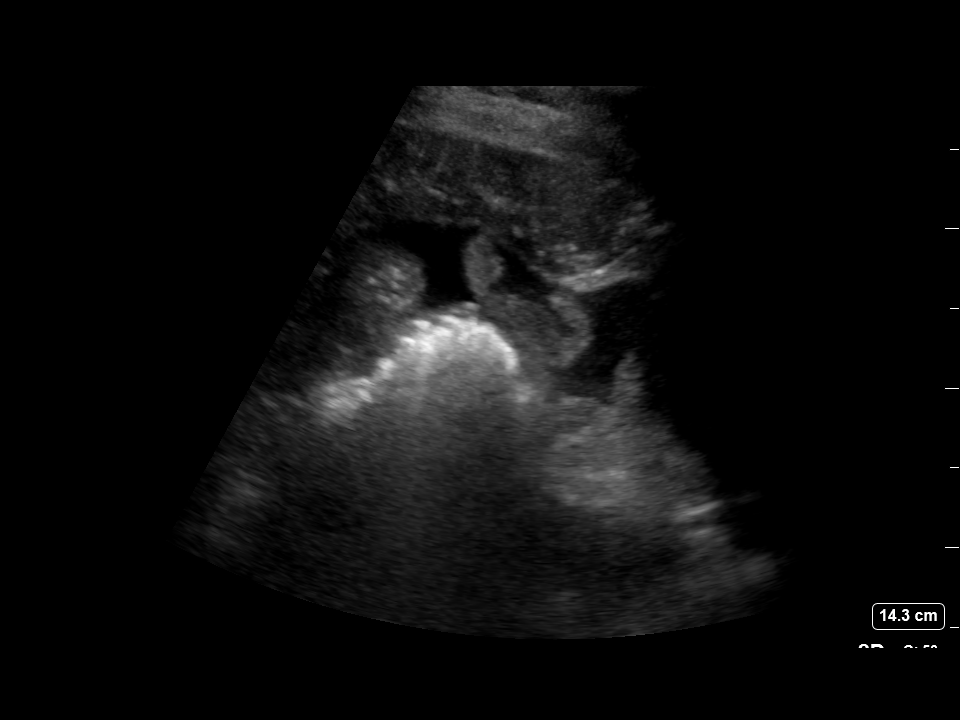

[1 of 1 positions shown; findings below may reference images not displayed]

EXAM:
ULTRASOUND GUIDED DIAGNOSTIC AND THERAPEUTIC PARACENTESIS

MEDICATIONS:
6 mL 1% lidocaine

COMPLICATIONS:
None immediate.

PROCEDURE:
Informed written consent was obtained from the patient after a
discussion of the risks, benefits and alternatives to treatment. A
timeout was performed prior to the initiation of the procedure.

Initial ultrasound scanning demonstrates a moderate amount of
ascites within the right lower abdominal quadrant. The right lower
abdomen was prepped and draped in the usual sterile fashion. 1%
lidocaine was used for local anesthesia.

Following this, a 19 gauge, 7-cm, Yueh catheter was introduced. An
ultrasound image was saved for documentation purposes. The
paracentesis was performed. The catheter was removed and a dressing
was applied. The patient tolerated the procedure well without
immediate post procedural complication.
FINDINGS: A total of approximately 2.1 L of clear yellow fluid was removed.
Samples were sent to the laboratory as requested by the clinical
team.
IMPRESSION: Successful ultrasound-guided paracentesis yielding 2.1 liters of
peritoneal fluid.

Read by Kaid, Molay Rachid
